# Patient Record
Sex: Female | Born: 1950
Health system: Southern US, Community
[De-identification: ages and names within clinical notes are randomized; demographics above are authoritative.]

## PROBLEM LIST (undated history)

## (undated) ENCOUNTER — Emergency Department (HOSPITAL_BASED_OUTPATIENT_CLINIC_OR_DEPARTMENT_OTHER): Payer: Federal, State, Local not specified - PPO

## (undated) DIAGNOSIS — E041 Nontoxic single thyroid nodule: Secondary | ICD-10-CM

## (undated) DIAGNOSIS — R0602 Shortness of breath: Secondary | ICD-10-CM

## (undated) DIAGNOSIS — H269 Unspecified cataract: Secondary | ICD-10-CM

## (undated) DIAGNOSIS — F419 Anxiety disorder, unspecified: Secondary | ICD-10-CM

## (undated) DIAGNOSIS — I10 Essential (primary) hypertension: Secondary | ICD-10-CM

## (undated) DIAGNOSIS — G4730 Sleep apnea, unspecified: Secondary | ICD-10-CM

## (undated) DIAGNOSIS — R5382 Chronic fatigue, unspecified: Secondary | ICD-10-CM

## (undated) DIAGNOSIS — I341 Nonrheumatic mitral (valve) prolapse: Secondary | ICD-10-CM

## (undated) DIAGNOSIS — B029 Zoster without complications: Secondary | ICD-10-CM

## (undated) DIAGNOSIS — D689 Coagulation defect, unspecified: Secondary | ICD-10-CM

## (undated) DIAGNOSIS — R600 Localized edema: Secondary | ICD-10-CM

## (undated) DIAGNOSIS — Z8719 Personal history of other diseases of the digestive system: Secondary | ICD-10-CM

## (undated) DIAGNOSIS — M797 Fibromyalgia: Secondary | ICD-10-CM

## (undated) DIAGNOSIS — I2699 Other pulmonary embolism without acute cor pulmonale: Secondary | ICD-10-CM

## (undated) DIAGNOSIS — T7840XA Allergy, unspecified, initial encounter: Secondary | ICD-10-CM

## (undated) DIAGNOSIS — D649 Anemia, unspecified: Secondary | ICD-10-CM

## (undated) DIAGNOSIS — R7303 Prediabetes: Secondary | ICD-10-CM

## (undated) DIAGNOSIS — K648 Other hemorrhoids: Secondary | ICD-10-CM

## (undated) DIAGNOSIS — K635 Polyp of colon: Secondary | ICD-10-CM

## (undated) DIAGNOSIS — G8929 Other chronic pain: Secondary | ICD-10-CM

## (undated) DIAGNOSIS — M1990 Unspecified osteoarthritis, unspecified site: Secondary | ICD-10-CM

## (undated) DIAGNOSIS — K222 Esophageal obstruction: Secondary | ICD-10-CM

## (undated) DIAGNOSIS — K5790 Diverticulosis of intestine, part unspecified, without perforation or abscess without bleeding: Secondary | ICD-10-CM

## (undated) DIAGNOSIS — R011 Cardiac murmur, unspecified: Secondary | ICD-10-CM

## (undated) DIAGNOSIS — H40009 Preglaucoma, unspecified, unspecified eye: Secondary | ICD-10-CM

## (undated) DIAGNOSIS — G4733 Obstructive sleep apnea (adult) (pediatric): Secondary | ICD-10-CM

## (undated) DIAGNOSIS — K219 Gastro-esophageal reflux disease without esophagitis: Secondary | ICD-10-CM

## (undated) DIAGNOSIS — E119 Type 2 diabetes mellitus without complications: Secondary | ICD-10-CM

## (undated) DIAGNOSIS — E739 Lactose intolerance, unspecified: Secondary | ICD-10-CM

## (undated) DIAGNOSIS — R1314 Dysphagia, pharyngoesophageal phase: Secondary | ICD-10-CM

## (undated) DIAGNOSIS — K589 Irritable bowel syndrome without diarrhea: Secondary | ICD-10-CM

## (undated) DIAGNOSIS — R079 Chest pain, unspecified: Secondary | ICD-10-CM

## (undated) HISTORY — DX: Coagulation defect, unspecified: D68.9

## (undated) HISTORY — PX: BREAST REDUCTION SURGERY: SHX8

## (undated) HISTORY — DX: Lactose intolerance, unspecified: E73.9

## (undated) HISTORY — DX: Sleep apnea, unspecified: G47.30

## (undated) HISTORY — DX: Nonrheumatic mitral (valve) prolapse: I34.1

## (undated) HISTORY — DX: Chest pain, unspecified: R07.9

## (undated) HISTORY — DX: Type 2 diabetes mellitus without complications: E11.9

## (undated) HISTORY — DX: Unspecified cataract: H26.9

## (undated) HISTORY — DX: Other hemorrhoids: K64.8

## (undated) HISTORY — DX: Prediabetes: R73.03

## (undated) HISTORY — DX: Allergy, unspecified, initial encounter: T78.40XA

## (undated) HISTORY — PX: COLONOSCOPY: SHX174

## (undated) HISTORY — DX: Anxiety disorder, unspecified: F41.9

## (undated) HISTORY — PX: APPENDECTOMY: SHX54

## (undated) HISTORY — DX: Preglaucoma, unspecified, unspecified eye: H40.009

## (undated) HISTORY — PX: ABDOMINAL HYSTERECTOMY: SHX81

## (undated) HISTORY — PX: UPPER GASTROINTESTINAL ENDOSCOPY: SHX188

## (undated) HISTORY — PX: CHOLECYSTECTOMY: SHX55

## (undated) HISTORY — DX: Esophageal obstruction: K22.2

## (undated) HISTORY — DX: Other pulmonary embolism without acute cor pulmonale: I26.99

## (undated) HISTORY — DX: Chronic fatigue, unspecified: R53.82

## (undated) HISTORY — PX: NISSEN FUNDOPLICATION: SHX2091

## (undated) HISTORY — PX: HEMORRHOID BANDING: SHX5850

## (undated) HISTORY — PX: HERNIA REPAIR: SHX51

## (undated) HISTORY — DX: Diverticulosis of intestine, part unspecified, without perforation or abscess without bleeding: K57.90

## (undated) HISTORY — DX: Polyp of colon: K63.5

## (undated) HISTORY — PX: JOINT REPLACEMENT: SHX530

## (undated) HISTORY — DX: Zoster without complications: B02.9

## (undated) HISTORY — DX: Gastro-esophageal reflux disease without esophagitis: K21.9

## (undated) HISTORY — PX: POLYPECTOMY: SHX149

## (undated) HISTORY — DX: Other chronic pain: G89.29

## (undated) HISTORY — DX: Dysphagia, pharyngoesophageal phase: R13.14

## (undated) HISTORY — DX: Localized edema: R60.0

## (undated) HISTORY — DX: Obstructive sleep apnea (adult) (pediatric): G47.33

## (undated) SURGERY — ARTHROSCOPY, KNEE
Anesthesia: General | Laterality: Left

---

## 1974-10-23 ENCOUNTER — Encounter: Payer: Self-pay | Admitting: Internal Medicine

## 1974-11-05 ENCOUNTER — Encounter: Payer: Self-pay | Admitting: Internal Medicine

## 1993-04-24 HISTORY — PX: BRAIN SURGERY: SHX531

## 1997-09-09 ENCOUNTER — Ambulatory Visit (HOSPITAL_COMMUNITY): Admission: RE | Admit: 1997-09-09 | Discharge: 1997-09-09 | Payer: Self-pay | Admitting: *Deleted

## 1997-11-02 ENCOUNTER — Ambulatory Visit (HOSPITAL_COMMUNITY): Admission: RE | Admit: 1997-11-02 | Discharge: 1997-11-02 | Payer: Self-pay | Admitting: Podiatry

## 1997-11-04 ENCOUNTER — Other Ambulatory Visit: Admission: RE | Admit: 1997-11-04 | Discharge: 1997-11-04 | Payer: Self-pay | Admitting: Podiatry

## 1998-02-17 ENCOUNTER — Ambulatory Visit (HOSPITAL_COMMUNITY): Admission: RE | Admit: 1998-02-17 | Discharge: 1998-02-17 | Payer: Self-pay | Admitting: Gastroenterology

## 1998-02-17 ENCOUNTER — Encounter: Payer: Self-pay | Admitting: Gastroenterology

## 1998-04-15 ENCOUNTER — Emergency Department (HOSPITAL_COMMUNITY): Admission: EM | Admit: 1998-04-15 | Discharge: 1998-04-15 | Payer: Self-pay | Admitting: Emergency Medicine

## 1998-05-22 ENCOUNTER — Emergency Department (HOSPITAL_COMMUNITY): Admission: EM | Admit: 1998-05-22 | Discharge: 1998-05-22 | Payer: Self-pay | Admitting: Emergency Medicine

## 1998-05-22 ENCOUNTER — Encounter: Payer: Self-pay | Admitting: Emergency Medicine

## 1998-08-02 ENCOUNTER — Ambulatory Visit (HOSPITAL_COMMUNITY): Admission: RE | Admit: 1998-08-02 | Discharge: 1998-08-02 | Payer: Self-pay | Admitting: Gastroenterology

## 1998-08-19 ENCOUNTER — Ambulatory Visit (HOSPITAL_COMMUNITY): Admission: RE | Admit: 1998-08-19 | Discharge: 1998-08-19 | Payer: Self-pay | Admitting: Gastroenterology

## 1998-09-16 ENCOUNTER — Encounter: Payer: Self-pay | Admitting: Cardiology

## 1998-09-16 ENCOUNTER — Inpatient Hospital Stay (HOSPITAL_COMMUNITY): Admission: EM | Admit: 1998-09-16 | Discharge: 1998-09-19 | Payer: Self-pay | Admitting: General Surgery

## 1998-11-29 ENCOUNTER — Emergency Department (HOSPITAL_COMMUNITY): Admission: EM | Admit: 1998-11-29 | Discharge: 1998-11-30 | Payer: Self-pay

## 1999-03-21 ENCOUNTER — Ambulatory Visit (HOSPITAL_COMMUNITY): Admission: RE | Admit: 1999-03-21 | Discharge: 1999-03-21 | Payer: Self-pay | Admitting: General Surgery

## 1999-03-21 ENCOUNTER — Encounter: Payer: Self-pay | Admitting: General Surgery

## 1999-05-23 ENCOUNTER — Ambulatory Visit (HOSPITAL_COMMUNITY): Admission: RE | Admit: 1999-05-23 | Discharge: 1999-05-23 | Payer: Self-pay | Admitting: Gastroenterology

## 1999-05-23 ENCOUNTER — Encounter: Payer: Self-pay | Admitting: Gastroenterology

## 1999-07-02 ENCOUNTER — Emergency Department (HOSPITAL_COMMUNITY): Admission: EM | Admit: 1999-07-02 | Discharge: 1999-07-02 | Payer: Self-pay

## 1999-07-17 ENCOUNTER — Ambulatory Visit (HOSPITAL_COMMUNITY): Admission: RE | Admit: 1999-07-17 | Discharge: 1999-07-17 | Payer: Self-pay | Admitting: *Deleted

## 1999-07-17 ENCOUNTER — Encounter: Payer: Self-pay | Admitting: *Deleted

## 1999-07-25 ENCOUNTER — Inpatient Hospital Stay (HOSPITAL_COMMUNITY): Admission: RE | Admit: 1999-07-25 | Discharge: 1999-08-03 | Payer: Self-pay | Admitting: General Surgery

## 1999-07-30 ENCOUNTER — Encounter: Payer: Self-pay | Admitting: Surgery

## 2000-01-20 ENCOUNTER — Other Ambulatory Visit: Admission: RE | Admit: 2000-01-20 | Discharge: 2000-01-20 | Payer: Self-pay | Admitting: Internal Medicine

## 2000-07-23 ENCOUNTER — Emergency Department (HOSPITAL_COMMUNITY): Admission: EM | Admit: 2000-07-23 | Discharge: 2000-07-24 | Payer: Self-pay | Admitting: Emergency Medicine

## 2000-07-24 ENCOUNTER — Encounter: Payer: Self-pay | Admitting: Emergency Medicine

## 2000-07-26 ENCOUNTER — Encounter: Admission: RE | Admit: 2000-07-26 | Discharge: 2000-07-26 | Payer: Self-pay | Admitting: Internal Medicine

## 2000-07-26 ENCOUNTER — Encounter: Payer: Self-pay | Admitting: Internal Medicine

## 2000-08-07 ENCOUNTER — Encounter: Payer: Self-pay | Admitting: Gastroenterology

## 2000-08-07 ENCOUNTER — Ambulatory Visit (HOSPITAL_COMMUNITY): Admission: RE | Admit: 2000-08-07 | Discharge: 2000-08-07 | Payer: Self-pay | Admitting: Gastroenterology

## 2001-03-29 ENCOUNTER — Emergency Department (HOSPITAL_COMMUNITY): Admission: EM | Admit: 2001-03-29 | Discharge: 2001-03-30 | Payer: Self-pay | Admitting: *Deleted

## 2001-06-04 ENCOUNTER — Encounter: Payer: Self-pay | Admitting: Gastroenterology

## 2001-06-04 ENCOUNTER — Encounter: Admission: RE | Admit: 2001-06-04 | Discharge: 2001-06-04 | Payer: Self-pay | Admitting: Gastroenterology

## 2001-06-10 ENCOUNTER — Encounter: Payer: Self-pay | Admitting: Gastroenterology

## 2001-06-10 ENCOUNTER — Encounter: Admission: RE | Admit: 2001-06-10 | Discharge: 2001-06-10 | Payer: Self-pay | Admitting: Gastroenterology

## 2002-02-07 ENCOUNTER — Encounter: Payer: Self-pay | Admitting: Internal Medicine

## 2002-02-07 ENCOUNTER — Encounter: Admission: RE | Admit: 2002-02-07 | Discharge: 2002-02-07 | Payer: Self-pay | Admitting: Internal Medicine

## 2002-06-21 ENCOUNTER — Emergency Department (HOSPITAL_COMMUNITY): Admission: EM | Admit: 2002-06-21 | Discharge: 2002-06-22 | Payer: Self-pay | Admitting: Emergency Medicine

## 2002-06-22 ENCOUNTER — Encounter: Payer: Self-pay | Admitting: Emergency Medicine

## 2002-09-24 ENCOUNTER — Encounter (INDEPENDENT_AMBULATORY_CARE_PROVIDER_SITE_OTHER): Payer: Self-pay | Admitting: Gastroenterology

## 2003-02-20 ENCOUNTER — Ambulatory Visit (HOSPITAL_BASED_OUTPATIENT_CLINIC_OR_DEPARTMENT_OTHER): Admission: RE | Admit: 2003-02-20 | Discharge: 2003-02-20 | Payer: Self-pay | Admitting: Internal Medicine

## 2003-02-20 ENCOUNTER — Encounter: Payer: Self-pay | Admitting: Pulmonary Disease

## 2003-03-27 ENCOUNTER — Encounter: Admission: RE | Admit: 2003-03-27 | Discharge: 2003-03-27 | Payer: Self-pay | Admitting: Internal Medicine

## 2003-04-20 ENCOUNTER — Emergency Department (HOSPITAL_COMMUNITY): Admission: EM | Admit: 2003-04-20 | Discharge: 2003-04-21 | Payer: Self-pay | Admitting: Emergency Medicine

## 2003-06-19 ENCOUNTER — Ambulatory Visit (HOSPITAL_COMMUNITY): Admission: RE | Admit: 2003-06-19 | Discharge: 2003-06-19 | Payer: Self-pay | Admitting: Gastroenterology

## 2003-07-08 ENCOUNTER — Emergency Department (HOSPITAL_COMMUNITY): Admission: EM | Admit: 2003-07-08 | Discharge: 2003-07-08 | Payer: Self-pay | Admitting: Emergency Medicine

## 2003-08-06 ENCOUNTER — Ambulatory Visit (HOSPITAL_COMMUNITY): Admission: RE | Admit: 2003-08-06 | Discharge: 2003-08-06 | Payer: Self-pay | Admitting: Internal Medicine

## 2003-08-07 ENCOUNTER — Ambulatory Visit (HOSPITAL_COMMUNITY): Admission: RE | Admit: 2003-08-07 | Discharge: 2003-08-07 | Payer: Self-pay | Admitting: Gastroenterology

## 2003-09-03 ENCOUNTER — Ambulatory Visit (HOSPITAL_COMMUNITY): Admission: RE | Admit: 2003-09-03 | Discharge: 2003-09-03 | Payer: Self-pay | Admitting: Gastroenterology

## 2004-02-01 ENCOUNTER — Ambulatory Visit (HOSPITAL_COMMUNITY): Admission: RE | Admit: 2004-02-01 | Discharge: 2004-02-01 | Payer: Self-pay | Admitting: Internal Medicine

## 2004-04-06 ENCOUNTER — Ambulatory Visit: Payer: Self-pay | Admitting: Internal Medicine

## 2004-04-07 ENCOUNTER — Ambulatory Visit: Payer: Self-pay | Admitting: Internal Medicine

## 2004-04-13 ENCOUNTER — Encounter: Admission: RE | Admit: 2004-04-13 | Discharge: 2004-04-13 | Payer: Self-pay | Admitting: Internal Medicine

## 2004-04-27 ENCOUNTER — Encounter: Admission: RE | Admit: 2004-04-27 | Discharge: 2004-04-27 | Payer: Self-pay | Admitting: Internal Medicine

## 2004-04-28 ENCOUNTER — Ambulatory Visit: Payer: Self-pay | Admitting: Internal Medicine

## 2004-04-28 ENCOUNTER — Ambulatory Visit (HOSPITAL_COMMUNITY): Admission: RE | Admit: 2004-04-28 | Discharge: 2004-04-28 | Payer: Self-pay | Admitting: Gastroenterology

## 2004-05-02 ENCOUNTER — Encounter (INDEPENDENT_AMBULATORY_CARE_PROVIDER_SITE_OTHER): Payer: Self-pay | Admitting: *Deleted

## 2004-05-02 ENCOUNTER — Ambulatory Visit (HOSPITAL_COMMUNITY): Admission: RE | Admit: 2004-05-02 | Discharge: 2004-05-02 | Payer: Self-pay | Admitting: Specialist

## 2004-05-02 ENCOUNTER — Ambulatory Visit (HOSPITAL_BASED_OUTPATIENT_CLINIC_OR_DEPARTMENT_OTHER): Admission: RE | Admit: 2004-05-02 | Discharge: 2004-05-02 | Payer: Self-pay | Admitting: Specialist

## 2004-07-26 ENCOUNTER — Ambulatory Visit: Payer: Self-pay | Admitting: Internal Medicine

## 2004-08-01 ENCOUNTER — Ambulatory Visit (HOSPITAL_COMMUNITY): Admission: RE | Admit: 2004-08-01 | Discharge: 2004-08-01 | Payer: Self-pay | Admitting: Internal Medicine

## 2004-08-15 ENCOUNTER — Ambulatory Visit: Payer: Self-pay | Admitting: Internal Medicine

## 2004-08-29 ENCOUNTER — Ambulatory Visit: Payer: Self-pay

## 2004-09-09 ENCOUNTER — Ambulatory Visit: Payer: Self-pay | Admitting: Pulmonary Disease

## 2004-10-17 ENCOUNTER — Ambulatory Visit: Payer: Self-pay | Admitting: Internal Medicine

## 2004-10-31 ENCOUNTER — Ambulatory Visit: Payer: Self-pay

## 2004-11-01 ENCOUNTER — Ambulatory Visit: Payer: Self-pay | Admitting: Pulmonary Disease

## 2004-11-24 ENCOUNTER — Ambulatory Visit: Payer: Self-pay | Admitting: Internal Medicine

## 2005-02-01 ENCOUNTER — Ambulatory Visit: Payer: Self-pay | Admitting: Gastroenterology

## 2005-03-08 ENCOUNTER — Ambulatory Visit: Payer: Self-pay | Admitting: Internal Medicine

## 2005-03-14 ENCOUNTER — Encounter: Admission: RE | Admit: 2005-03-14 | Discharge: 2005-03-14 | Payer: Self-pay | Admitting: Gastroenterology

## 2005-04-24 HISTORY — PX: REDUCTION MAMMAPLASTY: SUR839

## 2005-05-16 ENCOUNTER — Encounter: Admission: RE | Admit: 2005-05-16 | Discharge: 2005-08-14 | Payer: Self-pay | Admitting: Gastroenterology

## 2005-05-17 ENCOUNTER — Ambulatory Visit: Payer: Self-pay | Admitting: Gastroenterology

## 2005-05-30 ENCOUNTER — Ambulatory Visit: Payer: Self-pay | Admitting: Gastroenterology

## 2005-07-24 ENCOUNTER — Ambulatory Visit: Payer: Self-pay | Admitting: Gastroenterology

## 2005-07-31 ENCOUNTER — Ambulatory Visit: Payer: Self-pay | Admitting: Cardiology

## 2005-11-24 ENCOUNTER — Ambulatory Visit: Payer: Self-pay | Admitting: Internal Medicine

## 2005-12-12 ENCOUNTER — Ambulatory Visit: Payer: Self-pay | Admitting: Internal Medicine

## 2006-01-31 ENCOUNTER — Ambulatory Visit: Payer: Self-pay | Admitting: Internal Medicine

## 2006-02-07 ENCOUNTER — Ambulatory Visit: Payer: Self-pay | Admitting: Gastroenterology

## 2006-02-07 ENCOUNTER — Ambulatory Visit: Payer: Self-pay | Admitting: Internal Medicine

## 2006-02-09 ENCOUNTER — Ambulatory Visit: Payer: Self-pay | Admitting: Gastroenterology

## 2006-02-13 ENCOUNTER — Ambulatory Visit (HOSPITAL_COMMUNITY): Admission: RE | Admit: 2006-02-13 | Discharge: 2006-02-13 | Payer: Self-pay | Admitting: Gastroenterology

## 2006-02-15 ENCOUNTER — Ambulatory Visit: Payer: Self-pay | Admitting: Gastroenterology

## 2006-03-14 ENCOUNTER — Ambulatory Visit: Payer: Self-pay | Admitting: Internal Medicine

## 2006-04-24 HISTORY — PX: CHOLECYSTECTOMY: SHX55

## 2006-05-30 ENCOUNTER — Ambulatory Visit: Payer: Self-pay | Admitting: Gastroenterology

## 2006-06-25 ENCOUNTER — Encounter: Admission: RE | Admit: 2006-06-25 | Discharge: 2006-06-25 | Payer: Self-pay | Admitting: General Surgery

## 2006-06-28 ENCOUNTER — Encounter: Admission: RE | Admit: 2006-06-28 | Discharge: 2006-06-28 | Payer: Self-pay | Admitting: General Surgery

## 2006-09-05 ENCOUNTER — Ambulatory Visit: Payer: Self-pay | Admitting: Gastroenterology

## 2006-09-25 ENCOUNTER — Ambulatory Visit: Payer: Self-pay | Admitting: Cardiology

## 2006-10-09 ENCOUNTER — Ambulatory Visit: Payer: Self-pay | Admitting: Internal Medicine

## 2006-10-17 ENCOUNTER — Ambulatory Visit: Payer: Self-pay | Admitting: Internal Medicine

## 2006-10-23 ENCOUNTER — Ambulatory Visit: Payer: Self-pay | Admitting: Internal Medicine

## 2006-10-24 ENCOUNTER — Ambulatory Visit (HOSPITAL_COMMUNITY): Admission: RE | Admit: 2006-10-24 | Discharge: 2006-10-24 | Payer: Self-pay | Admitting: Internal Medicine

## 2006-11-23 ENCOUNTER — Ambulatory Visit: Payer: Self-pay | Admitting: Internal Medicine

## 2006-12-12 ENCOUNTER — Encounter (INDEPENDENT_AMBULATORY_CARE_PROVIDER_SITE_OTHER): Payer: Self-pay | Admitting: General Surgery

## 2006-12-12 ENCOUNTER — Ambulatory Visit (HOSPITAL_COMMUNITY): Admission: RE | Admit: 2006-12-12 | Discharge: 2006-12-14 | Payer: Self-pay | Admitting: General Surgery

## 2007-01-04 ENCOUNTER — Ambulatory Visit: Payer: Self-pay | Admitting: Internal Medicine

## 2007-01-26 ENCOUNTER — Ambulatory Visit: Payer: Self-pay | Admitting: Internal Medicine

## 2007-01-31 ENCOUNTER — Encounter: Payer: Self-pay | Admitting: *Deleted

## 2007-01-31 DIAGNOSIS — Z9079 Acquired absence of other genital organ(s): Secondary | ICD-10-CM | POA: Insufficient documentation

## 2007-01-31 DIAGNOSIS — K589 Irritable bowel syndrome without diarrhea: Secondary | ICD-10-CM | POA: Insufficient documentation

## 2007-01-31 DIAGNOSIS — K219 Gastro-esophageal reflux disease without esophagitis: Secondary | ICD-10-CM | POA: Insufficient documentation

## 2007-01-31 DIAGNOSIS — Z8679 Personal history of other diseases of the circulatory system: Secondary | ICD-10-CM | POA: Insufficient documentation

## 2007-02-27 ENCOUNTER — Ambulatory Visit: Payer: Self-pay | Admitting: Internal Medicine

## 2007-03-14 ENCOUNTER — Telehealth (INDEPENDENT_AMBULATORY_CARE_PROVIDER_SITE_OTHER): Payer: Self-pay | Admitting: *Deleted

## 2007-03-15 ENCOUNTER — Ambulatory Visit: Payer: Self-pay | Admitting: Internal Medicine

## 2007-03-15 DIAGNOSIS — H9319 Tinnitus, unspecified ear: Secondary | ICD-10-CM | POA: Insufficient documentation

## 2007-03-18 ENCOUNTER — Encounter (INDEPENDENT_AMBULATORY_CARE_PROVIDER_SITE_OTHER): Payer: Self-pay | Admitting: *Deleted

## 2007-03-29 ENCOUNTER — Encounter: Payer: Self-pay | Admitting: Internal Medicine

## 2007-04-09 ENCOUNTER — Encounter: Payer: Self-pay | Admitting: Internal Medicine

## 2007-04-15 ENCOUNTER — Encounter: Payer: Self-pay | Admitting: Internal Medicine

## 2007-05-22 DIAGNOSIS — K222 Esophageal obstruction: Secondary | ICD-10-CM | POA: Insufficient documentation

## 2007-06-19 ENCOUNTER — Ambulatory Visit: Payer: Self-pay | Admitting: Internal Medicine

## 2007-07-17 ENCOUNTER — Telehealth: Payer: Self-pay | Admitting: Internal Medicine

## 2007-07-18 ENCOUNTER — Ambulatory Visit: Payer: Self-pay | Admitting: Internal Medicine

## 2007-08-08 ENCOUNTER — Encounter
Admission: RE | Admit: 2007-08-08 | Discharge: 2007-08-08 | Payer: Self-pay | Admitting: Physical Medicine & Rehabilitation

## 2007-08-26 ENCOUNTER — Telehealth: Payer: Self-pay | Admitting: Internal Medicine

## 2007-08-26 DIAGNOSIS — R911 Solitary pulmonary nodule: Secondary | ICD-10-CM | POA: Insufficient documentation

## 2007-08-28 ENCOUNTER — Encounter: Payer: Self-pay | Admitting: Internal Medicine

## 2007-08-28 ENCOUNTER — Ambulatory Visit: Payer: Self-pay | Admitting: Internal Medicine

## 2007-08-29 ENCOUNTER — Ambulatory Visit: Payer: Self-pay | Admitting: Internal Medicine

## 2007-09-01 ENCOUNTER — Encounter: Payer: Self-pay | Admitting: Internal Medicine

## 2007-09-03 ENCOUNTER — Ambulatory Visit: Payer: Self-pay | Admitting: Internal Medicine

## 2007-09-03 ENCOUNTER — Encounter: Payer: Self-pay | Admitting: Internal Medicine

## 2007-09-03 LAB — HM COLONOSCOPY

## 2007-09-06 ENCOUNTER — Encounter: Payer: Self-pay | Admitting: Internal Medicine

## 2007-09-09 ENCOUNTER — Encounter: Payer: Self-pay | Admitting: Internal Medicine

## 2007-09-10 ENCOUNTER — Encounter: Payer: Self-pay | Admitting: Internal Medicine

## 2007-10-07 ENCOUNTER — Telehealth (INDEPENDENT_AMBULATORY_CARE_PROVIDER_SITE_OTHER): Payer: Self-pay | Admitting: *Deleted

## 2007-10-09 ENCOUNTER — Ambulatory Visit: Payer: Self-pay | Admitting: Internal Medicine

## 2007-10-14 ENCOUNTER — Ambulatory Visit: Payer: Self-pay | Admitting: Internal Medicine

## 2007-10-14 LAB — CONVERTED CEMR LAB
ALT: 16 units/L (ref 0–35)
AST: 22 units/L (ref 0–37)
Alkaline Phosphatase: 65 units/L (ref 39–117)
Anti Nuclear Antibody(ANA): NEGATIVE
Bilirubin, Direct: 0.1 mg/dL (ref 0.0–0.3)
CO2: 32 meq/L (ref 19–32)
Chloride: 101 meq/L (ref 96–112)
Glucose, Bld: 122 mg/dL — ABNORMAL HIGH (ref 70–99)
Hemoglobin: 12.5 g/dL (ref 12.0–15.0)
Lymphocytes Relative: 50.5 % — ABNORMAL HIGH (ref 12.0–46.0)
Monocytes Relative: 8.2 % (ref 3.0–12.0)
Neutro Abs: 1.7 10*3/uL (ref 1.4–7.7)
Neutrophils Relative %: 39.4 % — ABNORMAL LOW (ref 43.0–77.0)
Platelets: 263 10*3/uL (ref 150–400)
Potassium: 3.5 meq/L (ref 3.5–5.1)
RDW: 14.7 % — ABNORMAL HIGH (ref 11.5–14.6)
Rhuematoid fact SerPl-aCnc: <20 intl units/mL — ABNORMAL LOW (ref 0.0–20.0)
Sodium: 141 meq/L (ref 135–145)
Total Protein: 7.6 g/dL (ref 6.0–8.3)

## 2007-10-18 ENCOUNTER — Ambulatory Visit: Payer: Self-pay | Admitting: Internal Medicine

## 2007-10-24 ENCOUNTER — Encounter: Payer: Self-pay | Admitting: Internal Medicine

## 2007-10-24 LAB — CONVERTED CEMR LAB: 5-HIAA, 24 Hr Urine: 8 mg/(24.h) — ABNORMAL HIGH (ref <=6.0)

## 2007-11-01 ENCOUNTER — Encounter: Payer: Self-pay | Admitting: Internal Medicine

## 2007-11-06 ENCOUNTER — Telehealth: Payer: Self-pay | Admitting: Internal Medicine

## 2007-11-06 ENCOUNTER — Ambulatory Visit: Payer: Self-pay | Admitting: Internal Medicine

## 2007-11-13 ENCOUNTER — Telehealth: Payer: Self-pay | Admitting: Internal Medicine

## 2007-12-27 ENCOUNTER — Telehealth: Payer: Self-pay | Admitting: Internal Medicine

## 2008-01-15 ENCOUNTER — Ambulatory Visit: Payer: Self-pay | Admitting: Internal Medicine

## 2008-01-15 DIAGNOSIS — R55 Syncope and collapse: Secondary | ICD-10-CM | POA: Insufficient documentation

## 2008-01-15 DIAGNOSIS — R252 Cramp and spasm: Secondary | ICD-10-CM | POA: Insufficient documentation

## 2008-01-20 ENCOUNTER — Ambulatory Visit: Payer: Self-pay

## 2008-01-27 ENCOUNTER — Encounter: Admission: RE | Admit: 2008-01-27 | Discharge: 2008-01-27 | Payer: Self-pay | Admitting: Internal Medicine

## 2008-02-03 ENCOUNTER — Telehealth: Payer: Self-pay | Admitting: Internal Medicine

## 2008-02-05 ENCOUNTER — Encounter: Admission: RE | Admit: 2008-02-05 | Discharge: 2008-02-05 | Payer: Self-pay | Admitting: Internal Medicine

## 2008-02-10 ENCOUNTER — Telehealth: Payer: Self-pay | Admitting: Internal Medicine

## 2008-02-11 ENCOUNTER — Telehealth: Payer: Self-pay | Admitting: Internal Medicine

## 2008-02-14 ENCOUNTER — Encounter: Payer: Self-pay | Admitting: Internal Medicine

## 2008-02-19 ENCOUNTER — Encounter: Payer: Self-pay | Admitting: Internal Medicine

## 2008-02-24 ENCOUNTER — Telehealth: Payer: Self-pay | Admitting: Internal Medicine

## 2008-02-26 ENCOUNTER — Telehealth: Payer: Self-pay | Admitting: Internal Medicine

## 2008-02-27 ENCOUNTER — Ambulatory Visit: Payer: Self-pay | Admitting: Internal Medicine

## 2008-02-28 ENCOUNTER — Ambulatory Visit: Payer: Self-pay | Admitting: Internal Medicine

## 2008-04-06 ENCOUNTER — Ambulatory Visit: Payer: Self-pay | Admitting: Internal Medicine

## 2008-04-06 ENCOUNTER — Telehealth: Payer: Self-pay | Admitting: Internal Medicine

## 2008-04-15 ENCOUNTER — Encounter: Admission: RE | Admit: 2008-04-15 | Discharge: 2008-04-15 | Payer: Self-pay | Admitting: Family Medicine

## 2008-04-29 ENCOUNTER — Ambulatory Visit: Payer: Self-pay | Admitting: Internal Medicine

## 2008-04-29 ENCOUNTER — Ambulatory Visit: Payer: Self-pay | Admitting: Cardiology

## 2008-04-29 ENCOUNTER — Inpatient Hospital Stay (HOSPITAL_COMMUNITY): Admission: EM | Admit: 2008-04-29 | Discharge: 2008-05-05 | Payer: Self-pay | Admitting: Emergency Medicine

## 2008-04-30 ENCOUNTER — Encounter: Payer: Self-pay | Admitting: Internal Medicine

## 2008-04-30 ENCOUNTER — Ambulatory Visit: Payer: Self-pay | Admitting: Vascular Surgery

## 2008-05-06 ENCOUNTER — Telehealth: Payer: Self-pay | Admitting: Internal Medicine

## 2008-05-07 ENCOUNTER — Emergency Department (HOSPITAL_COMMUNITY): Admission: EM | Admit: 2008-05-07 | Discharge: 2008-05-07 | Payer: Self-pay | Admitting: Emergency Medicine

## 2008-05-07 ENCOUNTER — Ambulatory Visit: Payer: Self-pay | Admitting: Internal Medicine

## 2008-05-08 ENCOUNTER — Telehealth: Payer: Self-pay | Admitting: Internal Medicine

## 2008-05-08 ENCOUNTER — Encounter: Payer: Self-pay | Admitting: Internal Medicine

## 2008-05-08 ENCOUNTER — Ambulatory Visit: Payer: Self-pay | Admitting: Cardiology

## 2008-05-11 ENCOUNTER — Ambulatory Visit: Payer: Self-pay | Admitting: Cardiology

## 2008-05-14 ENCOUNTER — Ambulatory Visit: Payer: Self-pay | Admitting: Cardiology

## 2008-05-18 ENCOUNTER — Ambulatory Visit: Payer: Self-pay | Admitting: Internal Medicine

## 2008-05-19 DIAGNOSIS — I2699 Other pulmonary embolism without acute cor pulmonale: Secondary | ICD-10-CM | POA: Insufficient documentation

## 2008-05-26 ENCOUNTER — Telehealth (INDEPENDENT_AMBULATORY_CARE_PROVIDER_SITE_OTHER): Payer: Self-pay | Admitting: *Deleted

## 2008-05-28 ENCOUNTER — Telehealth: Payer: Self-pay | Admitting: Internal Medicine

## 2008-06-01 DIAGNOSIS — M25569 Pain in unspecified knee: Secondary | ICD-10-CM | POA: Insufficient documentation

## 2008-06-02 ENCOUNTER — Ambulatory Visit: Payer: Self-pay | Admitting: Internal Medicine

## 2008-06-02 LAB — CONVERTED CEMR LAB
Ketones, ur: NEGATIVE mg/dL
Leukocytes, UA: NEGATIVE
Specific Gravity, Urine: 1.025 (ref 1.000–1.03)
Urobilinogen, UA: 0.2 (ref 0.0–1.0)

## 2008-06-08 ENCOUNTER — Ambulatory Visit: Payer: Self-pay | Admitting: Internal Medicine

## 2008-06-29 ENCOUNTER — Ambulatory Visit: Payer: Self-pay | Admitting: Cardiology

## 2008-07-09 ENCOUNTER — Ambulatory Visit: Payer: Self-pay | Admitting: Internal Medicine

## 2008-07-20 ENCOUNTER — Ambulatory Visit: Payer: Self-pay | Admitting: Cardiology

## 2008-07-23 ENCOUNTER — Encounter: Admission: RE | Admit: 2008-07-23 | Discharge: 2008-07-23 | Payer: Self-pay | Admitting: Internal Medicine

## 2008-07-24 ENCOUNTER — Ambulatory Visit: Payer: Self-pay | Admitting: Diagnostic Radiology

## 2008-07-24 ENCOUNTER — Encounter: Payer: Self-pay | Admitting: Emergency Medicine

## 2008-07-24 ENCOUNTER — Observation Stay (HOSPITAL_COMMUNITY): Admission: EM | Admit: 2008-07-24 | Discharge: 2008-07-26 | Payer: Self-pay | Admitting: Internal Medicine

## 2008-07-24 ENCOUNTER — Telehealth: Payer: Self-pay | Admitting: Family Medicine

## 2008-07-24 ENCOUNTER — Ambulatory Visit: Payer: Self-pay | Admitting: Internal Medicine

## 2008-07-27 ENCOUNTER — Telehealth: Payer: Self-pay | Admitting: Internal Medicine

## 2008-08-04 ENCOUNTER — Ambulatory Visit: Payer: Self-pay | Admitting: Internal Medicine

## 2008-08-13 ENCOUNTER — Ambulatory Visit: Payer: Self-pay | Admitting: Internal Medicine

## 2008-09-11 ENCOUNTER — Ambulatory Visit: Payer: Self-pay | Admitting: Internal Medicine

## 2008-09-22 ENCOUNTER — Encounter: Payer: Self-pay | Admitting: *Deleted

## 2008-09-24 ENCOUNTER — Ambulatory Visit: Payer: Self-pay | Admitting: Internal Medicine

## 2008-09-24 LAB — CONVERTED CEMR LAB
POC INR: 1.2
Protime: 13.7

## 2008-10-01 ENCOUNTER — Ambulatory Visit: Payer: Self-pay | Admitting: Cardiology

## 2008-10-08 ENCOUNTER — Telehealth: Payer: Self-pay | Admitting: Internal Medicine

## 2008-10-16 ENCOUNTER — Telehealth (INDEPENDENT_AMBULATORY_CARE_PROVIDER_SITE_OTHER): Payer: Self-pay | Admitting: Cardiology

## 2008-10-16 ENCOUNTER — Encounter (INDEPENDENT_AMBULATORY_CARE_PROVIDER_SITE_OTHER): Payer: Self-pay | Admitting: Pharmacist

## 2008-10-16 ENCOUNTER — Ambulatory Visit: Payer: Self-pay | Admitting: Internal Medicine

## 2008-10-16 ENCOUNTER — Telehealth: Payer: Self-pay | Admitting: Internal Medicine

## 2008-10-16 ENCOUNTER — Encounter: Payer: Self-pay | Admitting: Cardiology

## 2008-10-16 LAB — CONVERTED CEMR LAB: Prothrombin Time: 17.5 s

## 2008-10-23 ENCOUNTER — Ambulatory Visit: Payer: Self-pay | Admitting: Internal Medicine

## 2008-10-28 ENCOUNTER — Encounter: Payer: Self-pay | Admitting: Cardiology

## 2008-10-28 ENCOUNTER — Encounter: Payer: Self-pay | Admitting: *Deleted

## 2008-10-28 ENCOUNTER — Ambulatory Visit: Payer: Self-pay | Admitting: Internal Medicine

## 2008-10-28 LAB — CONVERTED CEMR LAB: POC INR: 2.2

## 2008-11-02 ENCOUNTER — Ambulatory Visit: Payer: Self-pay | Admitting: Vascular Surgery

## 2008-11-19 ENCOUNTER — Ambulatory Visit: Payer: Self-pay | Admitting: Internal Medicine

## 2008-11-19 ENCOUNTER — Encounter: Admission: RE | Admit: 2008-11-19 | Discharge: 2008-11-19 | Payer: Self-pay | Admitting: Orthopedic Surgery

## 2008-11-19 DIAGNOSIS — I872 Venous insufficiency (chronic) (peripheral): Secondary | ICD-10-CM | POA: Insufficient documentation

## 2008-11-25 ENCOUNTER — Encounter: Payer: Self-pay | Admitting: Internal Medicine

## 2008-12-21 ENCOUNTER — Encounter: Payer: Self-pay | Admitting: Internal Medicine

## 2009-01-15 ENCOUNTER — Encounter: Payer: Self-pay | Admitting: Internal Medicine

## 2009-01-20 ENCOUNTER — Encounter: Payer: Self-pay | Admitting: Internal Medicine

## 2009-01-24 ENCOUNTER — Telehealth: Payer: Self-pay | Admitting: Internal Medicine

## 2009-01-24 ENCOUNTER — Emergency Department (HOSPITAL_COMMUNITY): Admission: EM | Admit: 2009-01-24 | Discharge: 2009-01-24 | Payer: Self-pay | Admitting: Emergency Medicine

## 2009-01-26 ENCOUNTER — Ambulatory Visit: Payer: Self-pay | Admitting: Internal Medicine

## 2009-02-01 ENCOUNTER — Encounter: Payer: Self-pay | Admitting: Internal Medicine

## 2009-02-08 ENCOUNTER — Encounter: Payer: Self-pay | Admitting: Internal Medicine

## 2009-02-08 ENCOUNTER — Ambulatory Visit: Payer: Self-pay | Admitting: Vascular Surgery

## 2009-02-11 ENCOUNTER — Telehealth: Payer: Self-pay | Admitting: Internal Medicine

## 2009-03-15 ENCOUNTER — Ambulatory Visit: Payer: Self-pay | Admitting: Vascular Surgery

## 2009-03-22 ENCOUNTER — Ambulatory Visit: Payer: Self-pay | Admitting: Vascular Surgery

## 2009-05-06 ENCOUNTER — Ambulatory Visit: Payer: Self-pay | Admitting: Vascular Surgery

## 2009-06-21 ENCOUNTER — Telehealth: Payer: Self-pay | Admitting: Internal Medicine

## 2009-06-24 ENCOUNTER — Telehealth: Payer: Self-pay | Admitting: Internal Medicine

## 2009-06-24 ENCOUNTER — Ambulatory Visit: Payer: Self-pay | Admitting: Endocrinology

## 2009-06-24 DIAGNOSIS — R42 Dizziness and giddiness: Secondary | ICD-10-CM | POA: Insufficient documentation

## 2009-06-24 DIAGNOSIS — I10 Essential (primary) hypertension: Secondary | ICD-10-CM | POA: Insufficient documentation

## 2009-06-24 DIAGNOSIS — R51 Headache: Secondary | ICD-10-CM | POA: Insufficient documentation

## 2009-06-24 DIAGNOSIS — R519 Headache, unspecified: Secondary | ICD-10-CM | POA: Insufficient documentation

## 2009-06-24 LAB — CONVERTED CEMR LAB: Blood Glucose, Fingerstick: 101

## 2009-06-25 LAB — CONVERTED CEMR LAB
Basophils Relative: 0.8 % (ref 0.0–3.0)
CO2: 33 meq/L — ABNORMAL HIGH (ref 19–32)
Calcium: 8.6 mg/dL (ref 8.4–10.5)
Creatinine, Ser: 0.8 mg/dL (ref 0.4–1.2)
Eosinophils Absolute: 0.2 10*3/uL (ref 0.0–0.7)
Eosinophils Relative: 3.2 % (ref 0.0–5.0)
Hemoglobin: 12.1 g/dL (ref 12.0–15.0)
Lymphocytes Relative: 46.8 % — ABNORMAL HIGH (ref 12.0–46.0)
MCHC: 32.2 g/dL (ref 30.0–36.0)
Monocytes Relative: 11.1 % (ref 3.0–12.0)
Neutro Abs: 1.9 10*3/uL (ref 1.4–7.7)
Neutrophils Relative %: 38.1 % — ABNORMAL LOW (ref 43.0–77.0)
RBC: 4.29 M/uL (ref 3.87–5.11)
Sodium: 142 meq/L (ref 135–145)
WBC: 4.9 10*3/uL (ref 4.5–10.5)

## 2009-07-02 ENCOUNTER — Ambulatory Visit: Payer: Self-pay | Admitting: Pulmonary Disease

## 2009-07-06 ENCOUNTER — Encounter: Payer: Self-pay | Admitting: Endocrinology

## 2009-07-12 LAB — CONVERTED CEMR LAB
Metaneph Total, Ur: 396 ug/24hr (ref 224–832)
Normetanephrine, 24H Ur: 285 (ref 122–676)

## 2009-07-25 ENCOUNTER — Ambulatory Visit (HOSPITAL_BASED_OUTPATIENT_CLINIC_OR_DEPARTMENT_OTHER): Admission: RE | Admit: 2009-07-25 | Discharge: 2009-07-25 | Payer: Self-pay | Admitting: Pulmonary Disease

## 2009-07-25 ENCOUNTER — Encounter: Payer: Self-pay | Admitting: Pulmonary Disease

## 2009-08-02 ENCOUNTER — Ambulatory Visit: Payer: Self-pay | Admitting: Internal Medicine

## 2009-08-03 ENCOUNTER — Telehealth: Payer: Self-pay | Admitting: Internal Medicine

## 2009-08-06 ENCOUNTER — Telehealth: Payer: Self-pay | Admitting: Internal Medicine

## 2009-08-06 ENCOUNTER — Ambulatory Visit: Payer: Self-pay | Admitting: Pulmonary Disease

## 2009-08-09 ENCOUNTER — Telehealth (INDEPENDENT_AMBULATORY_CARE_PROVIDER_SITE_OTHER): Payer: Self-pay | Admitting: *Deleted

## 2009-08-11 ENCOUNTER — Ambulatory Visit: Payer: Self-pay | Admitting: Pulmonary Disease

## 2009-08-12 ENCOUNTER — Telehealth: Payer: Self-pay | Admitting: Internal Medicine

## 2009-09-09 ENCOUNTER — Telehealth: Payer: Self-pay | Admitting: Internal Medicine

## 2009-09-13 ENCOUNTER — Telehealth: Payer: Self-pay | Admitting: Pulmonary Disease

## 2009-09-16 ENCOUNTER — Ambulatory Visit: Payer: Self-pay | Admitting: Pulmonary Disease

## 2009-09-27 ENCOUNTER — Encounter: Payer: Self-pay | Admitting: Pulmonary Disease

## 2009-09-27 ENCOUNTER — Telehealth: Payer: Self-pay | Admitting: Pulmonary Disease

## 2009-09-28 ENCOUNTER — Ambulatory Visit: Payer: Self-pay | Admitting: Pulmonary Disease

## 2009-09-28 DIAGNOSIS — G4730 Sleep apnea, unspecified: Secondary | ICD-10-CM

## 2009-09-28 DIAGNOSIS — G4710 Hypersomnia, unspecified: Secondary | ICD-10-CM | POA: Insufficient documentation

## 2009-09-30 ENCOUNTER — Encounter (INDEPENDENT_AMBULATORY_CARE_PROVIDER_SITE_OTHER): Payer: Self-pay | Admitting: *Deleted

## 2009-09-30 DIAGNOSIS — Z6833 Body mass index (BMI) 33.0-33.9, adult: Secondary | ICD-10-CM | POA: Insufficient documentation

## 2009-09-30 DIAGNOSIS — E663 Overweight: Secondary | ICD-10-CM

## 2009-10-06 ENCOUNTER — Ambulatory Visit: Payer: Self-pay | Admitting: Internal Medicine

## 2009-10-06 LAB — CONVERTED CEMR LAB
Bilirubin Urine: NEGATIVE
Glucose, Urine, Semiquant: NEGATIVE
Ketones, ur: NEGATIVE mg/dL
Ketones, urine, test strip: NEGATIVE
Specific Gravity, Urine: 1.01 (ref 1.000–1.030)
Urine Glucose: NEGATIVE mg/dL
Urobilinogen, UA: 0.2
Urobilinogen, UA: 0.2 (ref 0.0–1.0)
WBC Urine, dipstick: NEGATIVE
pH: 5

## 2009-10-07 ENCOUNTER — Ambulatory Visit: Payer: Self-pay | Admitting: Cardiology

## 2009-10-11 ENCOUNTER — Telehealth: Payer: Self-pay | Admitting: Internal Medicine

## 2009-10-12 ENCOUNTER — Ambulatory Visit: Payer: Self-pay | Admitting: Internal Medicine

## 2009-10-13 ENCOUNTER — Encounter: Payer: Self-pay | Admitting: Internal Medicine

## 2009-11-02 ENCOUNTER — Telehealth: Payer: Self-pay | Admitting: Internal Medicine

## 2009-11-03 ENCOUNTER — Ambulatory Visit: Payer: Self-pay | Admitting: Gastroenterology

## 2009-11-08 ENCOUNTER — Encounter: Payer: Self-pay | Admitting: Internal Medicine

## 2009-11-08 ENCOUNTER — Telehealth: Payer: Self-pay | Admitting: Internal Medicine

## 2009-11-24 ENCOUNTER — Telehealth: Payer: Self-pay | Admitting: Internal Medicine

## 2009-11-25 ENCOUNTER — Ambulatory Visit: Payer: Self-pay | Admitting: Internal Medicine

## 2009-11-25 DIAGNOSIS — R109 Unspecified abdominal pain: Secondary | ICD-10-CM | POA: Insufficient documentation

## 2009-11-25 LAB — CONVERTED CEMR LAB
BUN: 11 mg/dL (ref 6–23)
Bilirubin Urine: NEGATIVE
Creatinine, Ser: 0.8 mg/dL (ref 0.4–1.2)
GFR calc non Af Amer: 93.75 mL/min (ref >60)
Glucose, Bld: 98 mg/dL (ref 70–99)
Hemoglobin, Urine: NEGATIVE
Potassium: 3.9 meq/L (ref 3.5–5.1)
Total Protein, Urine: NEGATIVE mg/dL
pH: 5.5 (ref 5.0–8.0)

## 2009-11-26 ENCOUNTER — Encounter: Payer: Self-pay | Admitting: Pulmonary Disease

## 2009-11-26 ENCOUNTER — Telehealth: Payer: Self-pay | Admitting: Internal Medicine

## 2009-11-26 ENCOUNTER — Ambulatory Visit (HOSPITAL_BASED_OUTPATIENT_CLINIC_OR_DEPARTMENT_OTHER): Admission: RE | Admit: 2009-11-26 | Discharge: 2009-11-26 | Payer: Self-pay | Admitting: Pulmonary Disease

## 2009-12-07 ENCOUNTER — Ambulatory Visit: Payer: Self-pay | Admitting: Pulmonary Disease

## 2009-12-10 ENCOUNTER — Ambulatory Visit: Payer: Self-pay | Admitting: Pulmonary Disease

## 2009-12-23 ENCOUNTER — Encounter: Payer: Self-pay | Admitting: Internal Medicine

## 2010-01-06 ENCOUNTER — Encounter: Payer: Self-pay | Admitting: Pulmonary Disease

## 2010-01-10 ENCOUNTER — Ambulatory Visit: Payer: Self-pay | Admitting: Internal Medicine

## 2010-01-11 ENCOUNTER — Encounter: Payer: Self-pay | Admitting: Pulmonary Disease

## 2010-01-25 ENCOUNTER — Ambulatory Visit: Payer: Self-pay | Admitting: Pulmonary Disease

## 2010-02-09 ENCOUNTER — Encounter: Payer: Self-pay | Admitting: Internal Medicine

## 2010-02-14 ENCOUNTER — Telehealth: Payer: Self-pay | Admitting: Internal Medicine

## 2010-04-19 ENCOUNTER — Telehealth: Payer: Self-pay | Admitting: Internal Medicine

## 2010-05-24 ENCOUNTER — Ambulatory Visit
Admission: RE | Admit: 2010-05-24 | Discharge: 2010-05-24 | Payer: Self-pay | Source: Home / Self Care | Attending: Internal Medicine | Admitting: Internal Medicine

## 2010-05-24 ENCOUNTER — Encounter: Payer: Self-pay | Admitting: Internal Medicine

## 2010-05-25 ENCOUNTER — Other Ambulatory Visit: Payer: Self-pay

## 2010-05-26 ENCOUNTER — Other Ambulatory Visit: Payer: Self-pay | Admitting: Internal Medicine

## 2010-05-26 ENCOUNTER — Other Ambulatory Visit: Payer: Self-pay

## 2010-05-26 ENCOUNTER — Encounter (INDEPENDENT_AMBULATORY_CARE_PROVIDER_SITE_OTHER): Payer: Self-pay | Admitting: *Deleted

## 2010-05-26 DIAGNOSIS — R5383 Other fatigue: Secondary | ICD-10-CM

## 2010-05-26 DIAGNOSIS — R5381 Other malaise: Secondary | ICD-10-CM

## 2010-05-26 LAB — BASIC METABOLIC PANEL
CO2: 31 mEq/L (ref 19–32)
Calcium: 8.7 mg/dL (ref 8.4–10.5)
Glucose, Bld: 92 mg/dL (ref 70–99)
Sodium: 137 mEq/L (ref 135–145)

## 2010-05-26 LAB — CBC WITH DIFFERENTIAL/PLATELET
Eosinophils Relative: 3.9 % (ref 0.0–5.0)
HCT: 37.5 % (ref 36.0–46.0)
Hemoglobin: 12.7 g/dL (ref 12.0–15.0)
Lymphs Abs: 1.8 10*3/uL (ref 0.7–4.0)
Monocytes Relative: 8.8 % (ref 3.0–12.0)
Platelets: 272 10*3/uL (ref 150.0–400.0)
WBC: 3.8 10*3/uL — ABNORMAL LOW (ref 4.5–10.5)

## 2010-05-26 LAB — T4, FREE: Free T4: 0.74 ng/dL (ref 0.60–1.60)

## 2010-05-26 NOTE — Progress Notes (Signed)
Summary: RESULTS  Phone Note From Other Clinic   Summary of Call: lm for pt to call back Initial call taken by: Ami Bullins CMA,  November 26, 2009 2:33 PM Reason for Call: Discuss lab or test results Summary of Call: please call patient: U/A normal - no blood. Metabolic panel normal.  Thanks Initial call taken by: Jacques Navy MD,  November 26, 2009 9:15 AM  Follow-up for Phone Call        Pt informed  Follow-up by: Lamar Sprinkles, CMA,  November 26, 2009 4:50 PM

## 2010-05-26 NOTE — Assessment & Plan Note (Signed)
Summary: apena link only/cb   Allergies: 1)  ! Morphine 2)  ! Sulfa 3)  ! Phenergan   Other Orders: Sleep Std Airflow/Heartrate and O2 SAT unattended (04540)

## 2010-05-26 NOTE — Progress Notes (Signed)
Summary: nos appt  Phone Note Call from Patient   Caller: [email or web address]  Call For: Jennifer [name] Summary of Call: Rsc nos from 5/20 to 6/17 @ 3:15p. Initial call taken by: Darletta Moll,  Sep 13, 2009 9:38 AM     Appended Document: nos appt pl call her & find out if hse has obtained the positional device yet for obstructive sleep apnea ?  Appended Document: nos appt Pt states she has had same approx 3 weeks now and is sorry she did not call as instructed. Pt states husband c/o pt still snoring and stops breathing when on her side.   Appended Document: Orders Update let her know we will check home sleep study  - use device on nihgt of study arrange OV after  Pt picked up apnea link on Friday 09/24/09 and device was returned and downloaded on Monday 09/27/09. Appt scheduled for 09/28/09. Alfonso Ramus  September 28, 2009 9:41 AM   Clinical Lists Changes  Orders: Added new Referral order of Sleep Disorder Referral (Sleep Disorder) - Signed Added new Service order of Sleep Std Airflow/Heartrate and O2 SAT unattended (11914) - Signed

## 2010-05-26 NOTE — Miscellaneous (Signed)
Summary: PT Re-Eval / [facility]  PT Re-Eval / [facility]   Imported By: Lennie Odor 03/01/2010 11:58:40  _____________________________________________________________________  External Attachment:    Type:   Image     Comment:   External Document

## 2010-05-26 NOTE — Assessment & Plan Note (Signed)
Summary: f/u sleep study/LC   Copy to:  n/a Primary Provider/Referring Provider:  Illene Regulus, MD  CC:  Sleep study results.  History of Present Illness: 58/F, never smoker with positional obstructive sleep apnea. She denies excessive daytime somnolence &  reports Epworth Sleepiness Score as 1/24.Husband has witnessed apneas & gasping episodes in her sleep. She feels tired even after 8-10 hrs of sleep.  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 She stays in bed late on weekends. Sleep latency without restoril is 2-3 h, she has been taking this x 2 yrs. There are 3-4 spontaneous awakenings , no post void latency.  PSG in 10/04 when she weighed 210 lbs showed a TST of 250 mins incl 37 mins of REM, RDI was 15/h with lowest desaturation of 84% & modearte snoring, predominantly REM related events. She now weighs 256lbs.   August 11, 2009 1:40 PM  reviewed PSG >>Mild-to-moderate obstructive sleep apnea with events predominantly  during supine sleep causing sleep fragmentation and oxygen   desaturation. RDI was 17/h, non supine AHI was only 1.7/h  Current Medications (verified): 1)  Aciphex 20 Mg  Tbec (Rabeprazole Sodium) .... Take 1 Tablet By Mouth Two Times A Day 2)  Restoril 30 Mg Caps (Temazepam) .... Take 1 Tab By Mouth At Bedtime 3)  Triamterene-Hctz 37.5-25 Mg Tabs (Triamterene-Hctz) .... 1/2 Tab Once Daily 4)  Aspirin 325 Mg  Tabs (Aspirin) .... Take 1 Tablet By Mouth Once A Day  Allergies (verified): 1)  ! Morphine 2)  ! Sulfa 3)  ! 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  Patient does not get regular exercise.   Review of Systems       The patient complains of weight gain.  The patient denies anorexia, fever, weight loss, vision loss, decreased hearing, hoarseness, chest pain, syncope, dyspnea on exertion, peripheral edema, prolonged cough, headaches, hemoptysis, abdominal pain, melena, hematochezia, severe indigestion/heartburn, hematuria, muscle weakness, difficulty walking, depression, unusual weight change, and abnormal bleeding.    Vital Signs:  Patient profile:   60 year old female Height:      68.5 inches Weight:      258.25 pounds BMI:     38.84 O2 Sat:      95 % on Room air Temp:     98.1 degrees F oral Pulse rate:   84 / minute BP sitting:   126 / 84  (right arm) Cuff size:   regular  Vitals Entered By: Carver Fila SMA (August 11, 2009 1:40 PM)  O2 Flow:  Room air CC: Sleep study results Is Patient Diabetic? No Pain Assessment Patient in pain? no      Comments Meds updated   Physical Exam  Additional Exam:  Gen. Pleasant, well-nourished, in no distress, normal affect ENT - no lesions, no post nasal drip, class 2 airway Neck: No JVD, no thyromegaly, no carotid bruits Lungs: no use of accessory muscles, no dullness to  percussion, clear without rales or rhonchi  Cardiovascular: Rhythm regular, heart sounds  normal, no murmurs or gallops, no peripheral edema Musculoskeletal: No deformities, no cyanosis or clubbing      Impression & Recommendations:  Problem # 1:  SLEEP APNEA (ICD-780.57)  she has a stron positional component ,nonsupine AHI is very low. It may be worthwhile to try positional device - given information to get this . Once settled in with this, will repeat portable study with device to see if events  corrected. If unable to use, will try CPAP again with nasal pillows.  Orders: Est. Patient Level III (81191)  Patient Instructions: 1)  Copy sent to: Dr Debby Bud 2)  Please schedule a follow-up appointment in 1 month. 3)  Call me once you have used the position device x 1-2 weeks 4)  We will then do a portable study at home

## 2010-05-26 NOTE — Progress Notes (Signed)
Summary: Triage   Phone Note Call from Patient Call back at Home Phone [phone]   Caller: Patient Call For: Dr. Leone Payor Reason for Call: Talk to Nurse Summary of Call: pt. needs a note for work for today b/c she worked a 1/2 day and  is leaving early b/c she continues to have diarrhea Initial call taken by: Karna Christmas,  November 08, 2009 12:02 PM  Follow-up for Phone Call        Dr Leone Payor patient was seen last week by PAula she prescribed levsin for her IBS, is it ok to give her work note? Follow-up by: Darcey Nora RN, CGRN,  November 08, 2009 12:12 PM  Additional Follow-up for Phone Call Additional follow up Details #1::        yes Additional Follow-up by: Iva Boop MD, Clementeen Graham,  November 08, 2009 1:46 PM    Additional Follow-up for Phone Call Additional follow up Details #2::    patient advised to come pick up note at the front desk Follow-up by: Darcey Nora RN, CGRN,  November 08, 2009 2:01 PM

## 2010-05-26 NOTE — Assessment & Plan Note (Signed)
Summary: rov after titration study/apc   Visit Type:  Follow-up Copy to:  n/a Primary Provider/Referring Provider:  Illene Regulus, MD  CC:  Pt here for follow up after titration study.  History of Present Illness: 58/F, never smoker with positional obstructive sleep apnea.   PSG in 10/04 when she weighed 210 lbs showed a TST of 250 mins incl 37 mins of REM, RDI was 15/h with lowest desaturation of 84% & modearte snoring, predominantly REM related events. She now weighs 256lbs.   August 11, 2009 1:40 PM  reviewed PSG >>Mild-to-moderate obstructive sleep apnea with events predominantly  during supine sleep causing sleep fragmentation and oxygen   desaturation. RDI was 17/h, non supine AHI was only 1.7/h  September 28, 2009 9:48 AM  husband states less nsoring with positional device , but she is still tired.  potrbale study (using positional device) shows AHI of 10/h with desaturation index 12/h .  December 07, 2009 1:53 PM  psg (250 lbs)  - cpap titrated to 9 cm, smal full face mask agreeable to using CPAP, felt refreshed the mornign after.  Preventive Screening-Counseling & Management  Alcohol-Tobacco     Smoking Status: never  Current Medications (verified): 1)  Aciphex 20 Mg  Tbec (Rabeprazole Sodium) .... Take 1 Tablet By Mouth Two Times A Day 2)  Restoril 30 Mg Caps (Temazepam) .... Take 1 Tab By Mouth At Bedtime 3)  Triamterene-Hctz 37.5-25 Mg Tabs (Triamterene-Hctz) .... 1/2 Tab Once Daily 4)  Aspirin 325 Mg  Tabs (Aspirin) .... Take 1 Tablet By Mouth Once A Day 5)  Alprazolam 0.25 Mg Tabs (Alprazolam) .Marland Kitchen.. 1 Q 6 As Needed Anxiety 6)  Hydrocodone-Acetaminophen 5-325 Mg Tabs (Hydrocodone-Acetaminophen) .Marland Kitchen.. 1 By Mouth Q 6 As Needed 7)  Lidoderm 5 % Ptch (Lidocaine) .... Apply To Sore Flank Left On 12 Off 12. 8)  Levsin/sl 0.125 Mg Subl (Hyoscyamine Sulfate) .... Take 1 Three Times A Day For 4 Days Then Take As Needed  Allergies (verified): 1)  ! Morphine 2)  ! Sulfa 3)  !  Phenergan  Past History:  Past Medical History: Last updated: 10/23/2008 ESOPHAGEAL STENOSIS/STRICTURE AFTER FUNDOPLIACTION REDO SUBJECTIVE TINNITUS  MITRAL VALVE PROLAPSE IRRITABLE BOWEL SYNDROME  GERD  COSTOCHONDRITIS OSA-mild  sleep study '04 Pulmonary embolus 04/2008 Chronic Chest pain Chronic dysphagia  Social History: Last updated: 10/06/2009 [employer] graduate - technical school married '70- 2 years divorced; married '86 - 2.5 years divorced; married '90 - 1 year divorced; married '00 1 son - '36 Sister - died @ 40 massive MI work: [employer] Alcohol Use - no Patient has never smoked.  Patient does not get regular exercise.   Review of Systems  The patient denies anorexia, fever, weight loss, weight gain, vision loss, decreased hearing, hoarseness, chest pain, syncope, dyspnea on exertion, peripheral edema, prolonged cough, headaches, hemoptysis, abdominal pain, melena, hematochezia, severe indigestion/heartburn, hematuria, muscle weakness, suspicious skin lesions, difficulty walking, depression, unusual weight change, and abnormal bleeding.    Vital Signs:  Patient profile:   60 year old female Height:      68.5 inches Weight:      251 pounds BMI:     37.75 O2 Sat:      97 % on Room air Temp:     98.0 degrees F oral Pulse rate:   82 / minute BP sitting:   110 / 70  (left arm) Cuff size:   large  Vitals Entered By: Zackery Barefoot CMA (December 07, 2009 1:36 PM)  O2  Flow:  Room air CC: Pt here for follow up after titration study Comments Medications reviewed with patient Verified contact number and pharmacy with patient Zackery Barefoot CMA  December 07, 2009 1:36 PM    Physical Exam  Additional Exam:  Gen. Pleasant, well-nourished, in no distress, normal affect ENT - no lesions, no post nasal drip, class 2 airway Neck: No JVD, no thyromegaly, no carotid bruits Lungs: no use of accessory muscles, no dullness to percussion, clear without rales or rhonchi    Cardiovascular: Rhythm regular, heart sounds  normal, no murmurs or gallops, no peripheral edema Musculoskeletal: No deformities, no cyanosis or clubbing      Impression & Recommendations:  Problem # 1:  HYPERSOMNIA, ASSOCIATED WITH SLEEP APNEA (ICD-780.53) Start with CPAP 9 cm , small full face mask, humidity- downoad in 4 weeks The pathophysiology of obstructive sleep apnea, it's cardiovascular consequences and modes of treatment including CPAP were discussed with the patient in great detail.  Compliance encouraged, wt loss emphasized, asked to avoid meds with sedative side effects, cautioned against driving when sleepy.  If mask issues , wil change to nasal pillows but concern for mouth breathing. Orders: Est. Patient Level III (16109) DME Referral (DME)  Patient Instructions: 1)  Copy sent to:dr norins 2)  Please schedule a follow-up appointment in 6 weeks with download  3)  we will set you up with cpap machine

## 2010-05-26 NOTE — Assessment & Plan Note (Signed)
Summary: abdominal pain/sheric    History of Present Illness Visit Type: Follow-up Visit Primary GI MD: Stan Head MD Primary Fallon Howerter: Illene Regulus, MD Requesting Shirl Weir: na Chief Complaint: IBS History of Present Illness:   Saw Willette Cluster NP in july with diarrhea. Received Anucort suppositories in August at visit with Dr. Debby Bud.  She is describing alternating between diarrhea and constipation. Some minor rectal bleeding. She is also having dysphagia again, when she takes the first bite, she waits and after 10 minutes it relaxes and it will go down but not all the way and she regurgiates. Hot liquids or eating soft foods helps. She has never had complete relief of this even after dilation and has had many.  Hyoscyamine sublingual helps with colon.   GI Review of Systems    Reports abdominal pain and  bloating.     Location of  Abdominal pain: lower abdomen.    Denies acid reflux, belching, chest pain, dysphagia with liquids, dysphagia with solids, heartburn, loss of appetite, nausea, vomiting, vomiting blood, weight loss, and  weight gain.      Reports diarrhea and  irritable bowel syndrome.     Denies anal fissure, black tarry stools, change in bowel habit, constipation, diverticulosis, fecal incontinence, heme positive stool, hemorrhoids, jaundice, light color stool, liver problems, rectal bleeding, and  rectal pain. EGD  Procedure date:  01/04/2007  Findings:      Comments: 1) S/P 58, 60 FR ESOPHAGEAL DILATION TODAY 2) 62 FR UNSUCCESSFUL, MINOR MUCOSAL DISRUPTION. I DO NOT THINK THERE IS A SIGNIFICANT TEAR  Location: [facility]    Colonoscopy  Procedure date:  09/03/2007  Findings:      Results: Hemorrhoids. Results: Diverticulosis. Pathology:  Hyperplastic polyp. Location:  [facility].   Comments: 1) 5 MM DESCENDING COLON POLYP REMOVED 2) DIVERTICULOSIS 3) SMALL INTERNAL HEMORRHOIDS 4) OTHERWISE NORMAL, EXCELLENT  PREP 5) I THINK SYMPTOMS AND SIGNS ARE DUE TO IBS  ***MICROSCOPIC EXAMINATION AND DIAGNOSIS***    COLON, DESCENDING, POLYP, BIOPSY:   - INFLAMED PARTIALLY ULCERATED HYPERPLASTIC POLYP.   - NO ADENOMATOUS CHANGE OR MALIGNANCY IDENTIFIED.  Comments:      Repeat colonoscopy in 5 years.   Procedures Next Due Date:    Colonoscopy: 08/2012     Current Medications (verified): 1)  Aciphex 20 Mg  Tbec (Rabeprazole Sodium) .... Take 1 Tablet By Mouth Two Times A Day 2)  Restoril 30 Mg Caps (Temazepam) .... Take 1 Tab By Mouth At Bedtime 3)  Triamterene-Hctz 37.5-25 Mg Tabs (Triamterene-Hctz) .... 1/2 Tab Once Daily 4)  Aspirin 325 Mg  Tabs (Aspirin) .... Take 1 Tablet By Mouth Once A Day 5)  Alprazolam 0.25 Mg Tabs (Alprazolam) .Marland Kitchen.. 1 Q 6 As Needed Anxiety 6)  Lidoderm 5 % Ptch (Lidocaine) .... Apply To Sore Flank Left On 12 Off 12. 7)  Levsin/sl 0.125 Mg Subl (Hyoscyamine Sulfate) .... Take 1 Three Times A Day For 4 Days Then Take As Needed  Allergies (verified): 1)  ! Morphine 2)  ! Sulfa 3)  ! Phenergan  Past History:  Past Medical History: Reviewed history from 10/23/2008 and no changes required. ESOPHAGEAL STENOSIS/STRICTURE AFTER FUNDOPLIACTION REDO SUBJECTIVE TINNITUS  MITRAL VALVE PROLAPSE IRRITABLE BOWEL SYNDROME  GERD  COSTOCHONDRITIS OSA-mild  sleep study '04 Pulmonary embolus 04/2008 Chronic Chest pain Chronic dysphagia  Past Surgical History: Reviewed history from 11/03/2009 and no changes required. BRAIN STEM SURGERY, ARNOLD-CHIARI MALFORMATION, 1995 REDUCTION MAMMOPLASTY APPENDECTOMY, HX OF (ICD-V45.79) HYSTERECTOMY, HX OF (ICD-V45.77) NISSEN FUNDOPLICATION  X 3 (lap, open x2, last with mesh) S/P CHOLECYSTECTOMY '08  Family History: Reviewed history from 06/24/2009 and no changes required. father- deceased @52 : died in his sleep mohter - deceased @ 32: DM complications, RA 4 sisters with RA Family History of Colon Cancer:Maternal Grandmother  htn: both  parents and several sibs  Social History: Reviewed history from 10/06/2009 and no changes required. [employer] graduate - technical school married '70- 2 years divorced; married '86 - 2.5 years divorced; married '90 - 1 year divorced; married '00 1 son - '69 Sister - died @ 23 massive MI work: [employer] Alcohol Use - no Patient has never smoked.  Patient does not get regular exercise.   Review of Systems       c/o edema of legs/feet  Vital Signs:  Patient profile:   60 year old female Height:      68.5 inches Weight:      257 pounds BMI:     38.65 BSA:     2.29 Pulse rate:   88 / minute Pulse rhythm:   regular BP sitting:   128 / 62  (left arm) Cuff size:   large  Vitals Entered By: Ok Anis CMA (January 10, 2010 2:46 PM)  Physical Exam  General:  obese.  NAD Eyes:  anicteric Lungs:  clear ant Heart:  normal rate, regular rhythm, and no rub.   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 Baldo Daub last year. She tried a tricyclic without benefit retry Cymbalta, he had recommended that and follow-up, which she did not keep I explained rationaleof using anti-depressants to  treat pain, hypersensitivity and not depression I explained that it takes months before we can see full effect for this chrionic problem do not think dilation makes sense as ashe has never gotten long-lasting relief  Patient Instructions: 1)  start Cymbalta as written, take at bedtime 2)  I think this may help your swallowing and the IBS. 3)  Add Benefiber 1 packet or tablespoon in 6-8oz water daily. 4)  Please schedule a  follow-up appointment in 3 months. 5)  Please pick up your medications at your pharmacy. CYMBALTA 6)  Please continue using Levsin as needed. 7)  The medication list was reviewed and reconciled.  All changed / newly prescribed medications were explained.  A complete medication list was provided to the patient / caregiver. Prescriptions: CYMBALTA 30 MG  CPEP (DULOXETINE HCL) 1 by mouth once daily x 2 weeks then  2 by mouth once daily Brand medically necessary #60 x 3   Entered and Authorized by:   Iva Boop MD, [facility]   Signed by:   Iva Boop MD, [facility] on 01/10/2010   Method used:   Electronically to        [facility]* (retail)       [address] [place], [place]  [address]       Ph: [phone]       Fax: [phone]   RxID:   [id number]   Appended Document: abdominal pain/sheric download 8/23-9/20/11 on 9  cm >> good compliance, residual AHI 2.5/h

## 2010-05-26 NOTE — Progress Notes (Signed)
Summary: RF  Phone Note Refill Request Message from:  Pharmacy  Refills Requested: Medication #1:  RESTORIL 30 MG CAPS Take 1 tab by mouth at bedtime Initial call taken by: Lamar Sprinkles, CMA,  April 19, 2010 12:36 PM  Follow-up for Phone Call        ok for as needed refills Follow-up by: Jacques Navy MD,  April 19, 2010 1:05 PM    Prescriptions: RESTORIL 30 MG CAPS (TEMAZEPAM) Take 1 tab by mouth at bedtime  #30 x 1   Entered by:   Ami Bullins CMA   Authorized by:   Jacques Navy MD   Signed by:   Bill Salinas CMA on 04/19/2010   Method used:   Telephoned to ...       [facility]* (retail)       [address] [place], [place]  [address]       Ph: [phone]       Fax: [phone]   RxID:   [id number]

## 2010-05-26 NOTE — Assessment & Plan Note (Signed)
Summary: per phone note/#/cd   Vital Signs:  Patient profile:   60 year old female Height:      68.5 inches Weight:      254 pounds BMI:     38.20 O2 Sat:      95 % on Room air Temp:     97.7 degrees F oral Pulse rate:   75 / minute BP sitting:   126 / 88  (left arm) Cuff size:   regular  Vitals Entered By: Bill Salinas CMA (October 12, 2009 9:35 AM)  O2 Flow:  Room air CC: pt here with continued left flank pain/ ab   Primary Care Provider:  Illene Regulus, MD  CC:  pt here with continued left flank pain/ ab.  History of Present Illness: returns for persistent pain in the left low back that is worse with movement. She has had a negative CT abd/pelvis. U/A was negative. No signs of infection.  Current Medications (verified): 1)  Aciphex 20 Mg  Tbec (Rabeprazole Sodium) .... Take 1 Tablet By Mouth Two Times A Day 2)  Restoril 30 Mg Caps (Temazepam) .... Take 1 Tab By Mouth At Bedtime 3)  Triamterene-Hctz 37.5-25 Mg Tabs (Triamterene-Hctz) .... 1/2 Tab Once Daily 4)  Aspirin 325 Mg  Tabs (Aspirin) .... Take 1 Tablet By Mouth Once A Day 5)  Alprazolam 0.25 Mg Tabs (Alprazolam) .Marland Kitchen.. 1 Q 6 As Needed Anxiety 6)  Hydrocodone-Acetaminophen 5-325 Mg Tabs (Hydrocodone-Acetaminophen) .Marland Kitchen.. 1 By Mouth Q 6 As Needed  Allergies (verified): 1)  ! Morphine 2)  ! Sulfa 3)  ! Phenergan PMH-FH-SH reviewed-no changes except otherwise noted  Review of Systems  The patient denies anorexia, fever, weight loss, weight gain, vision loss, decreased hearing, chest pain, syncope, peripheral edema, headaches, abdominal pain, severe indigestion/heartburn, genital sores, transient blindness, depression, and enlarged lymph nodes.    Physical Exam  General:  alert, well-developed, and well-nourished.   Head:  normocephalic and atraumatic.   Lungs:  normal respiratory effort, normal breath sounds, no crackles, and no wheezes.   Heart:  normal rate and regular rhythm.   Abdomen:  soft, normal bowel  sounds, and no guarding.   Msk:  very tender at the left flank T12-L2 level in the paravetebral musculature. Can flex, toe/heel walk, hurts to stretch up.   Impression & Recommendations:  Problem # 1:  FLANK PAIN, LEFT (ICD-789.09) Internal disease not identified on CT. Exam c/w MSK related pain.   Plan - lidoderm patch - to affected area - on 12 off 12          back stretching exercise.  Her updated medication list for this problem includes:    Aspirin 325 Mg Tabs (Aspirin) .Marland Kitchen... Take 1 tablet by mouth once a day    Hydrocodone-acetaminophen 5-325 Mg Tabs (Hydrocodone-acetaminophen) .Marland Kitchen... 1 by mouth q 6 as needed  Complete Medication List: 1)  Aciphex 20 Mg Tbec (Rabeprazole sodium) .... Take 1 tablet by mouth two times a day 2)  Restoril 30 Mg Caps (Temazepam) .... Take 1 tab by mouth at bedtime 3)  Triamterene-hctz 37.5-25 Mg Tabs (Triamterene-hctz) .... 1/2 tab once daily 4)  Aspirin 325 Mg Tabs (Aspirin) .... Take 1 tablet by mouth once a day 5)  Alprazolam 0.25 Mg Tabs (Alprazolam) .Marland Kitchen.. 1 q 6 as needed anxiety 6)  Hydrocodone-acetaminophen 5-325 Mg Tabs (Hydrocodone-acetaminophen) .Marland Kitchen.. 1 by mouth q 6 as needed 7)  Lidoderm 5 % Ptch (Lidocaine) .... Apply to sore flank left on 12 off 12.  Patient Instructions: 1)  Pain in left flank - normal CT scan. Suspect this is a muscular pain. Plan - apply lidoderm patch to the area of greatest tenderness, on 12hrs off 12 hrs. Take generic aleve two times a day. Stretch exercises. Prescriptions: LIDODERM 5 % PTCH (LIDOCAINE) apply to sore flank left on 12 off 12.  #10 x 6   Entered and Authorized by:   Jacques Navy MD   Signed by:   Jacques Navy MD on 10/12/2009   Method used:   Electronically to        [facility]* (retail)       [address] [place], [place]  [address]       Ph: [phone]       Fax: [phone]   RxID:   [id number]

## 2010-05-26 NOTE — Progress Notes (Signed)
Summary: triage   Phone Note Call from Patient Call back at [phone]   Caller: Patient Call For: Dr. Leone Payor Reason for Call: Talk to Nurse Summary of Call: would like to be worked in for lower back pain... pt thinks it is GI related Initial call taken by: Vallarie Mare,  November 24, 2009 4:41 PM  Follow-up for Phone Call        Left message for patient to call back Darcey Nora RN, [facility]  November 25, 2009 8:25 AM  Patient  is going to see Dr Arthur Holms today , she is scheduled to see Dr Leone Payor for 01/10/10 2:30 due to nausea and intermittent rectal bleeding. Follow-up by: Darcey Nora RN, CGRN,  November 25, 2009 11:48 AM

## 2010-05-26 NOTE — Progress Notes (Signed)
  Phone Note Refill Request Message from:  Fax from Pharmacy on February 14, 2010 9:41 AM  Refills Requested: Medication #1:  RESTORIL 30 MG CAPS Take 1 tab by mouth at bedtime   Last Refilled: 12/28/2009 [phone] Last ov 11/25/09 Is this ok to refill?  Next Appointment Scheduled: none Initial call taken by: Orlan Leavens RMA,  February 14, 2010 9:42 AM  Follow-up for Phone Call        ok to refill as needed  Follow-up by: Jacques Navy MD,  February 14, 2010 10:30 AM    Prescriptions: RESTORIL 30 MG CAPS (TEMAZEPAM) Take 1 tab by mouth at bedtime  #30 x 1   Entered by:   Ami Bullins CMA   Authorized by:   Jacques Navy MD   Signed by:   Bill Salinas CMA on 02/14/2010   Method used:   Telephoned to ...       [facility]* (retail)       [address] [place], [place]  [address]       Ph: [phone]       Fax: [phone]   RxID:   [id number]

## 2010-05-26 NOTE — Letter (Signed)
Summary: Disability note for patient  Disability note for patient   Imported By: Lester Broadus 10/28/2009 08:05:28  _____________________________________________________________________  External Attachment:    Type:   Image     Comment:   External Document

## 2010-05-26 NOTE — Progress Notes (Signed)
Summary: REFERRAL  Phone Note Call from Patient   Summary of Call: Patient is requesting referral to nutritionist.  Initial call taken by: Lamar Sprinkles, CMA,  August 06, 2009 1:21 PM  Follow-up for Phone Call        refer to Maryan Puls, MS, RD [phone] for consulation on weight loss, hypertension. [facility] notified Follow-up by: Jacques Navy MD,  August 07, 2009 4:57 PM

## 2010-05-26 NOTE — Assessment & Plan Note (Signed)
Summary: 6 week follow up with download in HP//jwr   Visit Type:  Follow-up Copy to:  Gauge Winski Primary Provider/Referring Provider:  Illene Regulus, MD  CC:  Pt here for follow up. Pt c/o increased intermittent fatigue. Pt denies headaches. She states is using CPAP everynight. Pt requesting flu vaccine.  History of Present Illness: 58/F, never smoker with positional obstructive sleep apnea.   PSG in 10/04 when she weighed 210 lbs showed a TST of 250 mins incl 37 mins of REM, RDI was 15/h with lowest desaturation of 84% & modearte snoring, predominantly REM related events. She now weighs 256lbs.   August 11, 2009 1:40 PM  reviewed PSG >>Mild-to-moderate obstructive sleep apnea with events predominantly  during supine sleep causing sleep fragmentation and oxygen   desaturation. RDI was 17/h, non supine AHI was only 1.7/h  September 28, 2009 9:48 AM  husband states less nsoring with positional device , but she is still tired.  potrbale study (using positional device) shows AHI of 10/h with desaturation index 12/h .  January 25, 2010 10:36 AM  psg (250 lbs)  - cpap titrated to 9 cm, smal full face mask agreeable to using CPAP, felt refreshed the mornign after. Able to use CPAP, download 8/23- 01/11/10 >> no residual events, good compliance, minimal leak  Preventive Screening-Counseling & Management  Alcohol-Tobacco     Smoking Status: never  Current Medications (verified): 1)  Aciphex 20 Mg  Tbec (Rabeprazole Sodium) .... Take 1 Tablet By Mouth Two Times A Day 2)  Restoril 30 Mg Caps (Temazepam) .... Take 1 Tab By Mouth At Bedtime 3)  Triamterene-Hctz 37.5-25 Mg Tabs (Triamterene-Hctz) .... 1/2 Tab Once Daily 4)  Aspirin 325 Mg  Tabs (Aspirin) .... Take 1 Tablet By Mouth Once A Day 5)  Alprazolam 0.25 Mg Tabs (Alprazolam) .Marland Kitchen.. 1 Q 6 As Needed Anxiety 6)  Lidoderm 5 % Ptch (Lidocaine) .... Apply To Sore Flank Left On 12 Off 12 As Needed 7)  Levsin/sl 0.125 Mg Subl (Hyoscyamine Sulfate) .... Take  1 Three Times A Day For 4 Days Then Take As Needed 8)  Cymbalta 30 Mg  Cpep (Duloxetine Hcl) .... Take 1 Tab By Mouth At Bedtime  Allergies (verified): 1)  ! Morphine 2)  ! Sulfa 3)  ! Phenergan  Past History:  Past Medical History: Last updated: 10/23/2008 ESOPHAGEAL STENOSIS/STRICTURE AFTER FUNDOPLIACTION REDO SUBJECTIVE TINNITUS  MITRAL VALVE PROLAPSE IRRITABLE BOWEL SYNDROME  GERD  COSTOCHONDRITIS OSA-mild  sleep study '04 Pulmonary embolus 04/2008 Chronic Chest pain Chronic dysphagia  Social History: Last updated: 10/06/2009 [employer] graduate - technical school married '70- 2 years divorced; married '86 - 2.5 years divorced; married '90 - 1 year divorced; married '00 1 son - '19 Sister - died @ 76 massive MI work: [employer] Alcohol Use - no Patient has never smoked.  Patient does not get regular exercise.   Review of Systems  The patient denies anorexia, fever, weight loss, weight gain, vision loss, decreased hearing, hoarseness, chest pain, syncope, dyspnea on exertion, peripheral edema, prolonged cough, headaches, hemoptysis, abdominal pain, melena, severe indigestion/heartburn, hematuria, muscle weakness, suspicious skin lesions, difficulty walking, depression, unusual weight change, abnormal bleeding, enlarged lymph nodes, and angioedema.    Vital Signs:  Patient profile:   60 year old female Height:      68.5 inches Weight:      249 pounds BMI:     37.44 O2 Sat:      97 % on Room air Temp:  98.0 degrees F oral Pulse rate:   80 / minute BP sitting:   110 / 70  (left arm) Cuff size:   large  Vitals Entered By: Zackery Barefoot CMA (January 25, 2010 10:16 AM)  O2 Flow:  Room air CC: Pt here for follow up. Pt c/o increased intermittent fatigue. Pt denies headaches. She states is using CPAP everynight. Pt requesting flu vaccine Comments Medications reviewed with patient Verified contact number and pharmacy with patient Zackery Barefoot CMA  January 25, 2010 10:16  AM    Physical Exam  Additional Exam:  wt 249 January 25, 2010  Gen. Pleasant, well-nourished, in no distress, normal affect ENT - no lesions, no post nasal drip, class 2 airway Neck: No JVD, no thyromegaly, no carotid bruits Lungs: no use of accessory muscles, no dullness to percussion, clear without rales or rhonchi  Cardiovascular: Rhythm regular, heart sounds  normal, no murmurs or gallops, no peripheral edema Musculoskeletal: No deformities, no cyanosis or clubbing      Impression & Recommendations:  Problem # 1:  HYPERSOMNIA, ASSOCIATED WITH SLEEP APNEA (ICD-780.53)  pressure of 9 cm seems adequate, well tolerated, good compliance Compliance encouraged, wt loss emphasized, asked to avoid meds with sedative side effects, cautioned against driving when sleepy.  If she loses 30-40 lbs, will likley come off CPAP. Maintenance discussed  Orders: Est. Patient Level III (81191)  Medications Added to Medication List This Visit: 1)  Lidoderm 5 % Ptch (Lidocaine) .... Apply to sore flank left on 12 off 12 as needed 2)  Cymbalta 30 Mg Cpep (Duloxetine hcl) .... Take 1 tab by mouth at bedtime  Patient Instructions: 1)  Copy sent to: dr Debby Bud 2)  Please schedule a follow-up appointment in 6 months. 3)  You are expected to use CPAP at least 4-6 hrs evey night    Appended Document: Orders Update     Clinical Lists Changes  Orders: Added new Service order of Admin 1st Vaccine (47829) - Signed Added new Service order of Flu Vaccine 64yrs + [phone]) - Signed Observations: Added new observation of FLU VAX VIS: 11/16/09 version (01/25/2010 14:05) Added new observation of FLU VAXLOT: [id number] (01/25/2010 14:05) Added new observation of FLU VAXMFR: [employer] (01/25/2010 14:05) Added new observation of FLU VAX EXP: 10/22/2010 (01/25/2010 14:05) Added new observation of FLU VAX DSE: 0.65ml (01/25/2010 14:05) Added new observation of FLU VAX: Fluvax 3+ (01/25/2010 14:05)Flu  Vaccine Consent Questions     Do you have a history of severe allergic reactions to this vaccine? no    Any prior history of allergic reactions to egg and/or gelatin? no    Do you have a sensitivity to the preservative Thimersol? no    Do you have a past history of Guillan-Barre Syndrome? no    Do you currently have an acute febrile illness? no    Have you ever had a severe reaction to latex? no    Vaccine information given and explained to patient? yes    Are you currently pregnant? no    Lot Number:[id number]   Exp Date:10/22/2010   Site Given  Left Deltoid IMvation of FLU VAX EXP: 10/22/2010 (01/25/2010 14:05) Added new observation of FLU VAX DSE: 0.44ml (01/25/2010 14:05) Added new observation of FLU VAX: Fluvax 3+ (01/25/2010 14:05)Jessica Robinson CMA  January 25, 2010 2:06 PM      .lbflu

## 2010-05-26 NOTE — Assessment & Plan Note (Signed)
Summary: PAIN PATCH NOT WORKING STILL IN PAIN-LB   Vital Signs:  Patient profile:   60 year old female Height:      68.5 inches Weight:      255 pounds BMI:     38.35 O2 Sat:      98 % on Room air Temp:     97.8 degrees F oral Pulse rate:   87 / minute BP sitting:   140 / 82  (left arm) Cuff size:   regular  Vitals Entered By: Bill Salinas CMA (November 25, 2009 1:50 PM)  O2 Flow:  Room air CC: pt here with c/o ongoing mid back pain with no relief from lidoderm patch/ ab   Primary Care Provider:  Illene Regulus, MD  CC:  pt here with c/o ongoing mid back pain with no relief from lidoderm patch/ ab.  History of Present Illness: Jennifer [name] is a 60 year old African-American female with a 1 week history of left flank pain. The patient states this has been a chronic problem for her but the pain has become increasingly worse in the last week which she accredits to taking a 2 day hiatus of her heating pack treatments. The patient describes the pain as very deep, constant, and an 8 out of 10 in severity. Patient states the pain radiates only to her stomach giving her a feeling of nausea. The patient denies vomiting, dysuria, fever, chills, hematuria, history of trauma or intense exercise, or analgesic overuse. The pain is alleviated by the application of pressure and is refractory to heat packs and hydrocodone.  The patient also complains of constipation and hemorrhoids.   Current Medications (verified): 1)  Aciphex 20 Mg  Tbec (Rabeprazole Sodium) .... Take 1 Tablet By Mouth Two Times A Day 2)  Restoril 30 Mg Caps (Temazepam) .... Take 1 Tab By Mouth At Bedtime 3)  Triamterene-Hctz 37.5-25 Mg Tabs (Triamterene-Hctz) .... 1/2 Tab Once Daily 4)  Aspirin 325 Mg  Tabs (Aspirin) .... Take 1 Tablet By Mouth Once A Day 5)  Alprazolam 0.25 Mg Tabs (Alprazolam) .Marland Kitchen.. 1 Q 6 As Needed Anxiety 6)  Hydrocodone-Acetaminophen 5-325 Mg Tabs (Hydrocodone-Acetaminophen) .Marland Kitchen.. 1 By Mouth Q 6 As Needed 7)   Lidoderm 5 % Ptch (Lidocaine) .... Apply To Sore Flank Left On 12 Off 12. 8)  Levsin/sl 0.125 Mg Subl (Hyoscyamine Sulfate) .... Take 1 Three Times A Day For 4 Days Then Take As Needed  Allergies (verified): 1)  ! Morphine 2)  ! Sulfa 3)  ! Phenergan PMH-FH-SH reviewed-no changes except otherwise noted  Review of Systems       The patient complains of hoarseness, headaches, hematochezia, and severe indigestion/heartburn.  The patient denies fever, weight loss, weight gain, and vision loss.         denies any falls, night sweats, dysuria, cramps, joint stiffness, seizure, memory loss, breast discharge, breast mass, bruising, abnl sneezing, runny nose  reports tinnitus, palpitations, diarrhea, constipation  Physical Exam  General:  alert and overweight-appearing.   Head:  normocephalic and atraumatic.   Eyes:  pupils equal, pupils round, pupils reactive to light, corneas and lenses clear, and no injection.   Ears:  R ear normal, L ear normal, and no external deformities.   Nose:  no external deformity, no external erythema, no nasal discharge, and no mucosal pallor.   Mouth:  good dentition, no gingival abnormalities, pharynx pink and moist, no erythema, and no exudates.   Neck:  supple, full ROM, and no  masses.   Chest Wall:  no deformities, no tenderness, and no mass.   Lungs:  normal respiratory effort, normal breath sounds, no crackles, and no wheezes.   Heart:  normal rate, regular rhythm, and no rub.   Abdomen:  left upper quadrant pain to deep palpation, with no mass, no guarding no rebound. No tenderness in the LLQ. Msk:  normal ROM, no joint tenderness, no joint swelling, no joint warmth, and no redness over joints.  Patient with tenderness to deep palpation on the left chest at the posterior axillary line. Minimal tenderness to percussion over the left flank.  Pulses:  R radial normal and L radial normal.   Neurologic:  strength normal in all extremities, sensation intact to  light touch, sensation intact to pinprick, gait normal, and Romberg negative.   Skin:  turgor normal, color normal, and no rashes.   Cervical Nodes:  no anterior cervical adenopathy and no posterior cervical adenopathy.   Axillary Nodes:  No supra-clavicular adenopathy Psych:  normally interactive, good eye contact, not anxious appearing, and not depressed appearing.     Impression & Recommendations:  Problem # 1:  FLANK PAIN, LEFT (ICD-789.09) assessment - Left flank pain refractory to hydrocodone and heat packs.  plan -     Physical Therapy Referral (PT) at [facility] to manage what is likely to be a problem of muskoskeletal origin.               TLB-Udip w/ Micro (81001-URINE) to rule out potential kidney stone.  Problem # 2:  HEMORRHOIDS, INTERNAL (ICD-455.0) assessment - Chronic hemorrhoids  plan - Treat hemorrhoids and soften stool with a combination of suppositories, Senekot 2x/day, Mirilax, and Sitz baths.  Complete Medication List: 1)  Aciphex 20 Mg Tbec (Rabeprazole sodium) .... Take 1 tablet by mouth two times a day 2)  Restoril 30 Mg Caps (Temazepam) .... Take 1 tab by mouth at bedtime 3)  Triamterene-hctz 37.5-25 Mg Tabs (Triamterene-hctz) .... 1/2 tab once daily 4)  Aspirin 325 Mg Tabs (Aspirin) .... Take 1 tablet by mouth once a day 5)  Alprazolam 0.25 Mg Tabs (Alprazolam) .Marland Kitchen.. 1 q 6 as needed anxiety 6)  Hydrocodone-acetaminophen 5-325 Mg Tabs (Hydrocodone-acetaminophen) .Marland Kitchen.. 1 by mouth q 6 as needed 7)  Lidoderm 5 % Ptch (Lidocaine) .... Apply to sore flank left on 12 off 12. 8)  Levsin/sl 0.125 Mg Subl (Hyoscyamine sulfate) .... Take 1 three times a day for 4 days then take as needed 9)  Anucort-hc 25 Mg Supp (Hydrocortisone acetate) .Marland Kitchen.. 1 pr rectum two times a day for internal hemorrhoids  Other Orders: TLB-BMP (Basic Metabolic Panel-BMET) (80048-METABOL)  Patient: Jennifer [name] Note: All result statuses are Final unless otherwise noted.  Tests: (1)  BMP (METABOL)   Sodium                    141 mEq/L                   135-145   Potassium                 3.9 mEq/L                   3.5-5.1   Chloride                  102 mEq/L                   96-112   Carbon Dioxide  31 mEq/L                    19-32   Glucose                   98 mg/dL                    91-47   BUN                       11 mg/dL                    8-29   Creatinine                0.8 mg/dL                   5.6-2.1   Calcium                   8.8 mg/dL                   3.0-86.5   GFR                       93.75 mL/min                >60  Tests: (2) UDip w/Micro (URINE)   Color                     YELLOW       RANGE:  Yellow;Lt. Yellow   Clarity                   CLEAR                       Clear   Specific Gravity          >=1.030                     1.000 - 1.030   Urine Ph                  5.5                         5.0-8.0   Protein                   NEGATIVE                    Negative   Urine Glucose             NEGATIVE                    Negative   Ketones                   TRACE                       Negative   Urine Bilirubin           NEGATIVE                    Negative   Blood                     NEGATIVE  Negative   Urobilinogen              0.2                         0.0 - 1.0   Leukocyte Esterace        NEGATIVE                    Negative   Nitrite                   NEGATIVE                    Negative   Urine WBC                 0-2/hpf                     0-2/hpf   Urine Mucus               Presence of                 None   Urine Epith               Rare(0-4/hpf)               Rare(0-4/hpf)   Urine Bacteria            Few(10-50/hpf)              NonePrescriptions: ANUCORT-HC 25 MG SUPP (HYDROCORTISONE ACETATE) 1 pr rectum two times a day for internal hemorrhoids  #12 x 3   Entered and Authorized by:   Jacques Navy MD   Signed by:   Jacques Navy MD on 11/25/2009   Method used:   Electronically to         [facility]* (retail)       [address] [place], [place]  [address]       Ph: [phone]       Fax: [phone]   RxID:   [id number]

## 2010-05-26 NOTE — Progress Notes (Signed)
Summary: ELEVATED BP  Phone Note Call from Patient Call back at [phone] or [phone]   Caller: Patient Call For: Dr Debby Bud Summary of Call: Pt states the last 3 days here blood pressure has been elevated: 158/96 and 155/93. Pt is not currently taking blood pressure medicine.Pt wants to know if there is anything she can do to bring it down. Pt requests a call from triage. If pt needs o.v she wants to see another MD this week. Please advise. Initial call taken by: Verdell Face,  June 24, 2009 3:37 PM  Follow-up for Phone Call        Pt c/o elevated bp and h/a x 4 days. OK work in per [employer], Pt informed, will be at office by 4:30 Follow-up by: Lamar Sprinkles, CMA,  June 24, 2009 4:01 PM

## 2010-05-26 NOTE — Assessment & Plan Note (Signed)
Summary: FU ON SLEEP STUDY/NWS  #   Vital Signs:  Patient profile:   60 year old female Height:      68.5 inches Weight:      247 pounds BMI:     37.14 O2 Sat:      96 % on Room air Temp:     97.9 degrees F oral Pulse rate:   83 / minute BP sitting:   152 / 88  (left arm) Cuff size:   regular  Vitals Entered By: Bill Salinas CMA (August 02, 2009 1:11 PM)  O2 Flow:  Room air CC: pt here for follow up after having sleep study, pt also wants to discuss seeing a nutritonist. Pt unsure of last tetanus and mammogram/ ab   Primary Care Provider:  Illene Regulus, MD  CC:  pt here for follow up after having sleep study and pt also wants to discuss seeing a nutritonist. Pt unsure of last tetanus and mammogram/ ab.  History of Present Illness: Follow-up after consult and testing:  She saw Dr. Vassie Loll for sleep consult: he recommended a split sleep study with a CPAP trial. She reports that she did not have a split study. she also reports that she has tried CPAP before and was intolerant. We discussed OSA and causes. I have recommended that she have the split study and that she allow Dr. Vassie Loll, if indicated, to arrange for home CPAP.  She saw Dr. Everardo All for BP mgt. She had 24 hr urine  for catecholeamines and metanephrines. - normal.  She has been having a recurrence of pain in the right back, similar to the pain she had before with pulmonary infarct from PE. She has trouble with deep breath.   Current Medications (verified): 1)  Aciphex 20 Mg  Tbec (Rabeprazole Sodium) .... Take 1 Tablet By Mouth Two Times A Day 2)  Restoril 30 Mg Caps (Temazepam) .... Take 1 Tab By Mouth At Bedtime 3)  Triamterene-Hctz 37.5-25 Mg Tabs (Triamterene-Hctz) .... 1/2 Tab Once Daily 4)  Aspirin 325 Mg  Tabs (Aspirin) .... Take 1 Tablet By Mouth Once A Day  Allergies (verified): 1)  ! Morphine 2)  ! Sulfa 3)  ! Phenergan  Past History:  Past Medical History: Last updated: 10/23/2008 ESOPHAGEAL  STENOSIS/STRICTURE AFTER FUNDOPLIACTION REDO SUBJECTIVE TINNITUS  MITRAL VALVE PROLAPSE IRRITABLE BOWEL SYNDROME  GERD  COSTOCHONDRITIS OSA-mild  sleep study '04 Pulmonary embolus 04/2008 Chronic Chest pain Chronic dysphagia  Past Surgical History: Last updated: 10/23/2008  BRAIN STEM SURGERY, ARNOLD-CHIARI MALFORMATION, 1995 REDUCTION MAMMOPLASTY APPENDECTOMY, HX OF (ICD-V45.79) HYSTERECTOMY, HX OF (ICD-V45.77) NISSEN FUNDOPLICATION X 3 (lap, open x2, last with mesh)  S/P CHOLECYSTECTOMY '08  Family History: Last updated: 07-13-2009 father- deceased @52 : died in his sleep mohter - deceased @ 11: DM complications, RA 4 sisters with RA Family History of Colon Cancer:Maternal Grandmother  htn: both parents and several sibs  Social History: Last updated: 10/23/2008 [employer] graduate - technical school married '70- 2 years divorced; married '86 - 2.5 years divorced; married '90 - 1 year divorced; married '00 1 son - '72 work: [employer] Alcohol Use - no Patient has never smoked.  Patient does not get regular exercise.   Review of Systems       The patient complains of chest pain.  The patient denies anorexia, fever, weight loss, hoarseness, syncope, dyspnea on exertion, prolonged cough, severe indigestion/heartburn, muscle weakness, suspicious skin lesions, and angioedema.    Physical Exam  General:  Well groomed heavyset AA  female in no distress Head:  Normocephalic and atraumatic without obvious abnormalities. No apparent alopecia or balding. Lungs:  normal respiratory effort, no intercostal retractions, no accessory muscle use, no dullness, no fremitus, and no wheezes.   Heart:  normal rate and regular rhythm.     Impression & Recommendations:  Problem # 1:  SLEEP APNEA (ICD-780.57) Discussed the benefit and encouraged her to 1) reschedule the appropriate sleep study 2) give CPAP a fair trial with properly fitted mask and instructions in use.   Problem # 2:  HYPERTENSION  (ICD-401.9)  Her updated medication list for this problem includes:    Triamterene-hctz 37.5-25 Mg Tabs (Triamterene-hctz) .Marland Kitchen... 1/2 tab once daily  BP today: 152/88 Prior BP: 126/82 (07/02/2009)  Labs Reviewed: K+: 3.5 (06/24/2009) Creat: : 0.8 (06/24/2009)     Better readings previously. Plan is to continue present medications  Problem # 3:  CHEST PAIN, PLEURITIC (ICD-786.52) Recurrent pleuritic chest pain with a normal exam and  O2 sat.  Plan - CXR  Her updated medication list for this problem includes:    Aspirin 325 Mg Tabs (Aspirin) .Marland Kitchen... Take 1 tablet by mouth once a day  Addendum - CXR normal  Complete Medication List: 1)  Aciphex 20 Mg Tbec (Rabeprazole sodium) .... Take 1 tablet by mouth two times a day 2)  Restoril 30 Mg Caps (Temazepam) .... Take 1 tab by mouth at bedtime 3)  Triamterene-hctz 37.5-25 Mg Tabs (Triamterene-hctz) .... 1/2 tab once daily 4)  Aspirin 325 Mg Tabs (Aspirin) .... Take 1 tablet by mouth once a day  Other Orders: T-2 View CXR (71020TC)

## 2010-05-26 NOTE — Letter (Signed)
Summary: CMN for CPAP Supplies/[facility]  CMN for CPAP Supplies/[facility]   Imported By: Sherian Rein 01/10/2010 14:08:22  _____________________________________________________________________  External Attachment:    Type:   Image     Comment:   External Document

## 2010-05-26 NOTE — Progress Notes (Signed)
  Phone Note Call from Patient Call back at Home Phone [phone]   Caller: Patient Summary of Call: Patient called requesting CT results. Initial call taken by: Rock Nephew CMA,  October 11, 2009 4:10 PM  Follow-up for Phone Call        called patinet - CT normal. Still having a lot of pain. Offered 9:30 AM appt for tomorrow tues 6/21 Follow-up by: Jacques Navy MD,  October 11, 2009 6:51 PM  Additional Follow-up for Phone Call Additional follow up Details #1::        appt made and in IDX for 6/21@9 :30am Additional Follow-up by: Verdell Face,  October 12, 2009 8:09 AM    Additional Follow-up for Phone Call Additional follow up Details #2::    pt here for appt Follow-up by: Ami Bullins CMA,  October 12, 2009 9:29 AM

## 2010-05-26 NOTE — Miscellaneous (Signed)
Summary: PT Eval/[facility]  PT Eval/[facility]   Imported By: Sherian Rein 01/04/2010 11:21:08  _____________________________________________________________________  External Attachment:    Type:   Image     Comment:   External Document

## 2010-05-26 NOTE — Progress Notes (Signed)
  Phone Note Outgoing Call   Reason for Call: Discuss lab or test results Summary of Call: please call patient - normal chest x-ray. No indication for  repoeat CT.  Thanks Initial call taken by: Jacques Navy MD,  August 03, 2009 8:40 AM  Follow-up for Phone Call        call patient after verifying DOB infomed pt of results. Follow-up by: Ami Bullins CMA, Brenton Grills  August 03, 2009 10:01 AM

## 2010-05-26 NOTE — Assessment & Plan Note (Signed)
Summary: elevated BP / [place] pt / SD   Vital Signs:  Patient profile:   60 year old female Height:      68.5 inches (173.99 cm) Weight:      257.38 pounds (116.99 kg) O2 Sat:      97 % on Room air Temp:     97.1 degrees F (36.17 degrees C) oral Pulse rate:   90 / minute BP sitting:   150 / 90  (right arm) Cuff size:   large  Vitals Entered By: Sydell Axon (June 24, 2009 4:34 PM)  O2 Flow:  Room air CC: elevated BP/ headache, lightheadedness X4days/ [place] CBG Result 101   Referring Provider:  n/a Primary Provider:  Illene Regulus, MD  CC:  elevated BP/ headache and lightheadedness X4days/ [place].  History of Present Illness: pt states 4 days of moderate (generalized throughout the head) headache, and associated lightheadedness in the context of standing.  Current Medications (verified): 1)  Aciphex 20 Mg  Tbec (Rabeprazole Sodium) .... Take 1 Tablet By Mouth Two Times A Day 2)  Restoril 30 Mg Caps (Temazepam) .... Take 1 Tab By Mouth At Bedtime  Allergies (verified): 1)  ! Morphine 2)  ! Sulfa 3)  ! Phenergan  Past History:  Past Medical History: Last updated: 10/23/2008 ESOPHAGEAL STENOSIS/STRICTURE AFTER FUNDOPLIACTION REDO SUBJECTIVE TINNITUS  MITRAL VALVE PROLAPSE IRRITABLE BOWEL SYNDROME  GERD  COSTOCHONDRITIS OSA-mild  sleep study '04 Pulmonary embolus 04/2008 Chronic Chest pain Chronic dysphagia  Family History: Reviewed history from 10/23/2008 and no changes required. father- deceased @52 : died in his sleep mohter - deceased @ 54: DM complications, RA 4 sisters with RA Family History of Colon Cancer:Maternal Grandmother  htn: both parents and several sibs  Review of Systems  The patient denies syncope.         she also has palpitations and excessive diaphoresis.  Physical Exam  General:  obese.  no distress  Lungs:  Clear to auscultation bilaterally. Normal respiratory effort.  Heart:  Regular rate and rhythm without murmurs or gallops  noted. Normal S1,S2.   Extremities:  trace right pedal edema and trace left pedal edema.   Additional Exam:  Sodium                    142 mEq/L                   135-145   Potassium                 3.5 mEq/L                   3.5-5.1   Chloride                  104 mEq/L                   96-112   Carbon Dioxide       [H]  33 mEq/L                    19-32   Glucose                   89 mg/dL                    16-10   BUN                       9  mg/dL                     1-61   Creatinine                0.8 mg/dL                   0.9-6.0   Calcium                   8.6 mg/dL                   4.5-40.9      White Cell Count          4.9 K/uL                    4.5-10.5    Hemoglobin                12.1 g/dL                   81.1-91.4   Hematocrit                37.6 %                      36.0-46.0    Platelet Count            248.0 K/uL    Impression & Recommendations:  Problem # 1:  HEADACHE (ICD-784.0) episodic  Problem # 2:  HYPERTENSION (ICD-401.9) ? related to #1  Problem # 3:  palpitations ? pheochromocytoma  Medications Added to Medication List This Visit: 1)  Triamterene-hctz 37.5-25 Mg Tabs (Triamterene-hctz) .... 1/2 tab once daily  Other Orders: EKG w/ Interpretation (93000) T-Urine 24 Hr. Catecholamines [phone]) T-Urine 24 Hr. Metanephrines [phone]) TLB-BMP (Basic Metabolic Panel-BMET) (80048-METABOL) TLB-CBC Platelet - w/Differential (85025-CBCD) Est. Patient Level IV (95284)  Patient Instructions: 1)  tests are being ordered for you today.  a few days after the test(s), please call [phone] to hear your test results. 2)  pending the test results, start triamterene-hctz, 37.5/25, 1/2 per day. 3)  arthrotec-75, 1 pill two times a day as needed headache.  here are some samples. Prescriptions: TRIAMTERENE-HCTZ 37.5-25 MG TABS (TRIAMTERENE-HCTZ) 1/2 tab once daily  #30 x 5   Entered and Authorized by:   Minus Breeding MD   Signed by:   Minus Breeding MD on 06/24/2009   Method used:   Electronically to        [facility]* (retail)       [address] [place], [place]  [address]       Ph: [phone]       Fax: [phone]   RxID:   [id number]   Laboratory Results   Blood Tests     CBG Random:: 101mg /dL

## 2010-05-26 NOTE — Letter (Signed)
Summary: Out of Work  [facility]  [address] [place], [place] [address]   Phone: [phone]  Fax: [phone]    11/08/2009  TO: WHOM IT MAY CONCERN  RE: Jennifer [name] [address] [id number]       The above named individual is currently under my care and will be out of work    FROM: 11/08/2009   THROUGH:11/09/09    REASON: Sick/diarrhea    MAY RETURN ON:11/10/09     If you have any further questions or need additional information, please call.     Sincerely,   Stan Head, MD typed by: Darcey Nora RN, CGRN

## 2010-05-26 NOTE — Procedures (Signed)
Summary: EGD:   Patient Name: Jennifer, [name]. MRN:  Procedure Procedures: EsophagoscopyCPT: 43200.    with [facility] Dilation of Esophagus Personnel: Endoscopist: Iva Boop, MD, [facility].  Exam Location: Exam performed in Outpatient Clinic. Outpatient  Patient Consent: Procedure, Alternatives, Risks and Benefits discussed, consent obtained, from patient. Consent was obtained by the RN.  Indications  Therapeutics: Reason for exam: Esophageal dilation.  Symptoms: Dysphagia.  History  Current Medications: Patient is not currently taking Coumadin.  Allergies: Patient is allergic to MORPHINE, SULFA, PHENERGAN.  Comments: 3 PRIOR FUNDOPLICATIONS. HAS PERSISTENT DYSPHAGIA. GETTING MONTHLY MALONEY DIALTIONS PER DR. PAPPAS' RECOMMENDATIONS. THIS IS DILATION #3. SAYS SHE GOT 1-2 WEEKS BENEFIT AFTER LAST DILATION. RECENT LAP CHOLE HELPED ABDOMINAL PAIN Pre-Exam Physical: Performed Oct 23, 2006  Cardio-pulmonary exam, HEENT exam, Abdominal exam, Mental status exam WNL.  Comments: Pt. history reviewed/updated, physical exam performed prior to initiation of sedation? YES Exam Exam Info: Maximum depth of insertion Stomach, intended Stomach. Patient position: on left side. Gastric retroflexion performed. Images taken. ASA Classification: II. Tolerance: good.  Sedation Meds: Patient assessed and found to be appropriate for moderate (conscious) sedation. Fentanyl 75 mcg. given IV. Versed 10 mg. given IV. Cetacaine Spray 2 sprays given aerosolized. Benadryl 50 given IV.  Monitoring: BP and pulse monitoring done. Oximetry used. Supplemental O2 given  Findings - Normal: Proximal Esophagus to Distal Esophagus.  PRIOR SURGERY: Cardia. Anti-Reflux Surgery. Comments: LOOKS NORMAL BUT DISTAL ESOPHAGUS COULD BE STENOTIC. .  - Dilation: Distal Esophagus. Maloney dilator used, Diameter: 56,58, 60 F, 3  total dilators used. Patient tolerance fair, adequate exam. Comments: 56 AND 58 PASSED OK  AND REINSPECTION WITHOUT ABNORMALITY, 60 FR DILATOR PASSED TO 20 CM BUT SHE RETCHED AND I WITHDREW. REINSPECTION SHOWED SMALL MUCOSAL DISRUPTION IN PROXIMAL ESOPHAGUS WITH SMALL AMOUNT OF HEME.( LOWER LEFT PHOTO) MINOR MUCOSAL DISRUPTIONS WITH SMALL HEME IN PROXIMAL STOMACH ALSO.    Comments: EXAM TO PROXIMAL STOMACH ONLY SHE GAGS WITH THE DILATORS  Assessment  Comments: 1) S/P 56, 58 FR ESOPHAGEAL DILATION TODAY 2) 60 FR UNSUCCESSFUL, MINOR MUCOSAL DISRUPTION. I DO NOT THINK THERE IS A SIGNIFICANT TEAR Events  Unplanned Intervention: No unplanned interventions were required.  Plans Comments: CLEAR LIQUIDS ONLY TODAY NOTHING COLD GRADUALLY ADVANCE DIET TOMORROW Disposition: After procedure patient sent to recovery. After recovery patient sent home.  Scheduling: Office Visit, to Iva Boop, MD, Clementeen Graham, 3 WEEKS TO REVIEW   Comments: NEED TO CONSIDER REASSESSMENT WITH BA SWALLOW CONSIDER 18-20 MM BALLOON NEXT TIME IF WE CONTINUE  CC:   Milana Kidney, MD   Glenna Fellows, MD   Illene Regulus, MD  This report was created from the original endoscopy report, which was reviewed and signed by the above listed endoscopist.

## 2010-05-26 NOTE — Miscellaneous (Signed)
Summary: Orders Update   Clinical Lists Changes  Problems: Added new problem of OVERWEIGHT (ICD-278.02) - Signed Orders: Added new Referral order of Nutrition Referral (Nutrition) - Signed

## 2010-05-26 NOTE — Letter (Signed)
Summary: Verification of Disability/[facility]  Verification of Disability/[facility]   Imported By: Sherian Rein 10/15/2009 08:14:00  _____________________________________________________________________  External Attachment:    Type:   Image     Comment:   External Document

## 2010-05-26 NOTE — Progress Notes (Signed)
Summary: f/u  Phone Note Call from Patient Call back at Work Phone [phone]   Caller: Patient Call For: Vassie Loll Reason for Call: Talk to Nurse Summary of Call: pt resch to 06/07.  Need apnea link results today for appt tomorrow.  If no results, pt may need to resch.  That was what her f/u was for. Initial call taken by: Eugene Gavia,  September 27, 2009 11:42 AM  Follow-up for Phone Call        pt wants to know if apnea link results are available for appt tomorrow? If not pt wants to r/s appt. Please advise if you have results. Carron Curie CMA  September 27, 2009 11:59 AM  pl confirm from [facility]  that we have this ?    Follow-up by: Comer Locket Vassie Loll MD,  September 27, 2009 12:02 PM  Additional Follow-up for Phone Call Additional follow up Details #1::        Are the apnea link results for this pt available? Carron Curie CMA  September 27, 2009 12:10 PM  Pt was given the apnea link on Friday with instructions to return to [place] today. Haven't recvd. the apnea link back from pt. Called pt and she stated that her husband was to return it this am. Checked with front and apnea link was not turned. Pt stated that he may have turned in device to primary care. Called and [facility] for primary care to return my call. Alfonso Ramus  September 27, 2009 12:38 PM Harriett Sine with Primary Care returned my call and stated that Mr or Mrs Buskirk didn't stop by there this morning. Called Mrs. Scalisi back and let her know that we nor primary care has the apnea link. She will try and get in touch with her husband to see where it is. Rhonda Cobb  September 27, 2009 2:02 PM Pt's husband brought in apnea link. Downloaded and report is in Dr. Reginia Naas look at. Pt is aware to keep her appt tomorrow. Alfonso Ramus  September 27, 2009 3:06 PM     Additional Follow-up for Phone Call Additional follow up Details #2::    ok Follow-up by: Comer Locket. Vassie Loll MD,  September 27, 2009 4:56 PM

## 2010-05-26 NOTE — Progress Notes (Signed)
Summary: Bouts of diarrhea   Phone Note Call from Patient Call back at Work Phone [phone]   Call For: Dr Leone Payor Reason for Call: Talk to Nurse Summary of Call: Bouts of diarrhea and wonders if she can be seen this week. Initial call taken by: Leanor Kail [facility],  November 02, 2009 4:46 PM  Follow-up for Phone Call        Left message for patient to call back Darcey Nora RN, [facility]  November 03, 2009 9:20 AM  Her IBS has flared up, severe urgency and cramping with each meal and some vomiting.  Patient  will come in and see Willette Cluster RNP today at 3:30 Follow-up by: Darcey Nora RN, CGRN,  November 03, 2009 10:53 AM

## 2010-05-26 NOTE — Assessment & Plan Note (Signed)
Summary: abdominal cramping/IBS/sheri   History of Present Illness Visit Type: Follow-up Visit Primary GI MD: Stan Head MD Primary Provider: Illene Regulus, MD Chief Complaint: chronic diarrhea x 1week, sweating, nausea History of Present Illness:   Patient is a 60 year old female well known to Dr. Leone Payor for chronic dysphagia post three fundoplications. She also has a history of Irritable Bowel Syndrome.  Patient here with five day history of severe, post-prandial abdominal pain, rumbling stomach, nausea and vomiting. Used to have these episodes about ten times a year but now happening more frequently. She sweats perfusely during these episdoes and afterwards feels very weak and has to lay down. No fevers.  Dr. [name] Gubler diagnosed patient with IBS. She used to take a medication under the tongue for relief but doesn't have pills left.     GI Review of Systems    Reports abdominal pain, acid reflux, bloating, and  nausea.     Location of  Abdominal pain: generalized.    Denies belching, chest pain, dysphagia with liquids, dysphagia with solids, heartburn, loss of appetite, vomiting, vomiting blood, weight loss, and  weight gain.      Reports diarrhea.     Denies anal fissure, black tarry stools, change in bowel habit, constipation, diverticulosis, fecal incontinence, heme positive stool, hemorrhoids, irritable bowel syndrome, jaundice, light color stool, liver problems, rectal bleeding, and  rectal pain.   Current Medications (verified): 1)  Aciphex 20 Mg  Tbec (Rabeprazole Sodium) .... Take 1 Tablet By Mouth Two Times A Day 2)  Restoril 30 Mg Caps (Temazepam) .... Take 1 Tab By Mouth At Bedtime 3)  Triamterene-Hctz 37.5-25 Mg Tabs (Triamterene-Hctz) .... 1/2 Tab Once Daily 4)  Aspirin 325 Mg  Tabs (Aspirin) .... Take 1 Tablet By Mouth Once A Day 5)  Alprazolam 0.25 Mg Tabs (Alprazolam) .Marland Kitchen.. 1 Q 6 As Needed Anxiety 6)  Hydrocodone-Acetaminophen 5-325 Mg Tabs (Hydrocodone-Acetaminophen)  .Marland Kitchen.. 1 By Mouth Q 6 As Needed 7)  Lidoderm 5 % Ptch (Lidocaine) .... Apply To Sore Flank Left On 12 Off 12.  Allergies (verified): 1)  ! Morphine 2)  ! Sulfa 3)  ! Phenergan  Past History:  Past Medical History: Reviewed history from 10/23/2008 and no changes required. ESOPHAGEAL STENOSIS/STRICTURE AFTER FUNDOPLIACTION REDO SUBJECTIVE TINNITUS  MITRAL VALVE PROLAPSE IRRITABLE BOWEL SYNDROME  GERD  COSTOCHONDRITIS OSA-mild  sleep study '04 Pulmonary embolus 04/2008 Chronic Chest pain Chronic dysphagia  Past Surgical History: BRAIN STEM SURGERY, ARNOLD-CHIARI MALFORMATION, 1995 REDUCTION MAMMOPLASTY APPENDECTOMY, HX OF (ICD-V45.79) HYSTERECTOMY, HX OF (ICD-V45.77) NISSEN FUNDOPLICATION X 3 (lap, open x2, last with mesh) S/P CHOLECYSTECTOMY '08  Family History: Reviewed history from 06/24/2009 and no changes required. father- deceased @52 : died in his sleep mohter - deceased @ 34: DM complications, RA 4 sisters with RA Family History of Colon Cancer:Maternal Grandmother  htn: both parents and several sibs  Social History: Reviewed history from 10/06/2009 and no changes required. [employer] graduate - technical school married '70- 2 years divorced; married '86 - 2.5 years divorced; married '90 - 1 year divorced; married '00 1 son - '71 Sister - died @ 73 massive MI work: [employer] Alcohol Use - no Patient has never smoked.  Patient does not get regular exercise.   Review of Systems       The patient complains of back pain, muscle pains/cramps, night sweats, and sleeping problems.  The patient denies allergy/sinus, anemia, anxiety-new, arthritis/joint pain, blood in urine, breast changes/lumps, change in vision, confusion, cough, coughing up blood, depression-new,  fainting, fatigue, fever, headaches-new, hearing problems, heart murmur, heart rhythm changes, itching, menstrual pain, nosebleeds, pregnancy symptoms, shortness of breath, skin rash, sore throat, swelling of feet/legs,  swollen lymph glands, thirst - excessive , urination - excessive , urination changes/pain, urine leakage, vision changes, and voice change.    Vital Signs:  Patient profile:   60 year old female Height:      68.5 inches Weight:      250.25 pounds BMI:     37.63 Temp:     98.6 degrees F oral Pulse rate:   72 / minute Pulse rhythm:   regular BP sitting:   122 / 74  (left arm) Cuff size:   regular  Vitals Entered By: June McMurray CMA Duncan Dull) (November 03, 2009 3:44 PM)  Physical Exam  General:  Well developed, well nourished, no acute distress. Head:  Normocephalic and atraumatic. Eyes:  Conjunctiva pink, no icterus.  Mouth:  No oral lesions. Tongue moist.  Neck:  no obvious masses  Heart:  Regular rate and rhythm; no murmurs, rubs,  or bruits. Abdomen:  Abdomen soft, nontender, nondistended. No obvious masses or hepatomegaly.Normal bowel sounds.  Msk:  Symmetrical with no gross deformities. Normal posture. Extremities:  No palmar erythema, no edema.  Neurologic:  Alert and  oriented x4;  grossly normal neurologically. Skin:  Intact without significant lesions or rashes. Cervical Nodes:  No significant cervical adenopathy. Psych:  Alert and cooperative. Normal mood and affect.  Impression & Recommendations:  Problem # 1:  IRRITABLE BOWEL SYNDROME (ICD-564.1) Assessment Deteriorated Patient looks good, abdominal exam is benign. Her symptoms are exactly as described during May 2009 visit with Dr. Leone Payor. This is likely an IBS flare, patient agrees. She would like refill on sublingual Hyoscyamine and we can certainly do that. Patient will call in a few days if symptoms do not resolve.    Patient Instructions: 1)  Levsin #30 x 1 RF take 1 three times a day x 4 days then as needed thereafter. 2)  Follow-up with Dr. Leone Payor as needed.  3)  The medication list was reviewed and reconciled.  All changed / newly prescribed medications were explained.  A complete medication list was  provided to the patient / caregiver.  Prescriptions: LEVSIN/SL 0.125 MG SUBL (HYOSCYAMINE SULFATE) take 1 three times a day for 4 days then take as needed  #30 x 1   Entered by:   Lowry Ram NCMA   Authorized by:   Willette Cluster NP   Signed by:   Lowry Ram NCMA on 11/03/2009   Method used:   Electronically to        [facility]* (retail)       [address] [place], [place]  [address]       Ph: [phone]       Fax: [phone]   RxID:   [id number]

## 2010-05-26 NOTE — Assessment & Plan Note (Signed)
Summary: low grade fever/pain side/cd   Vital Signs:  Patient profile:   60 year old female Height:      68.5 inches Weight:      254 pounds BMI:     38.20 O2 Sat:      96 % on Room air Temp:     97.7 degrees F oral Pulse rate:   83 / minute BP sitting:   128 / 78  (left arm) Cuff size:   large  Vitals Entered By: Bill Salinas CMA (October 06, 2009 9:24 AM)  O2 Flow:  Room air CC: pt here with c/o left side pain with urinary freq/ ab   Primary Care Provider:  Illene Regulus, MD  CC:  pt here with c/o left side pain with urinary freq/ ab.  History of Present Illness: Having severe left flank pain for 5 days which is getting worse. Pain is enough to cause nause. She has had urinary frequency. She has pain with movement, especially stretching or reaching. Denies any injury or strain. Pressure against the area helps. No history of kidney stones.   Current Medications (verified): 1)  Aciphex 20 Mg  Tbec (Rabeprazole Sodium) .... Take 1 Tablet By Mouth Two Times A Day 2)  Restoril 30 Mg Caps (Temazepam) .... Take 1 Tab By Mouth At Bedtime 3)  Triamterene-Hctz 37.5-25 Mg Tabs (Triamterene-Hctz) .... 1/2 Tab Once Daily 4)  Aspirin 325 Mg  Tabs (Aspirin) .... Take 1 Tablet By Mouth Once A Day 5)  Alprazolam 0.25 Mg Tabs (Alprazolam) .Marland Kitchen.. 1 Q 6 As Needed Anxiety  Allergies (verified): 1)  ! Morphine 2)  ! Sulfa 3)  ! Phenergan  Past History:  Past Medical History: Last updated: 10/23/2008 ESOPHAGEAL STENOSIS/STRICTURE AFTER FUNDOPLIACTION REDO SUBJECTIVE TINNITUS  MITRAL VALVE PROLAPSE IRRITABLE BOWEL SYNDROME  GERD  COSTOCHONDRITIS OSA-mild  sleep study '04 Pulmonary embolus 04/2008 Chronic Chest pain Chronic dysphagia  Past Surgical History: Last updated: 10/23/2008  BRAIN STEM SURGERY, ARNOLD-CHIARI MALFORMATION, 1995 REDUCTION MAMMOPLASTY APPENDECTOMY, HX OF (ICD-V45.79) HYSTERECTOMY, HX OF (ICD-V45.77) NISSEN FUNDOPLICATION X 3 (lap, open x2, last with mesh)  S/P  CHOLECYSTECTOMY '08 PSH reviewed for relevance, FH reviewed for relevance  Social History: [employer] graduate - technical school married '70- 2 years divorced; married '86 - 2.5 years divorced; married '90 - 1 year divorced; married '00 1 son - '32 Sister - died @ 20 massive MI work: [employer] Alcohol Use - no Patient has never smoked.  Patient does not get regular exercise.   Review of Systems       The patient complains of fever and dyspnea on exertion.  The patient denies anorexia, weight loss, weight gain, decreased hearing, chest pain, peripheral edema, prolonged cough, abdominal pain, incontinence, muscle weakness, transient blindness, depression, and abnormal bleeding.         99.9 temp  Physical Exam  General:  Overweight AA female in no distress but uncomfortable Head:  normocephalic and atraumatic.   Eyes:  corneas and lenses clear and no injection.   Neck:  supple and full ROM.   Lungs:  normal respiratory effort and normal breath sounds.   Heart:  normal rate, regular rhythm, and no murmur.   Abdomen:  very tender to percussion over the left flank. Abdominal tenderness left quadrant below the costal margin.  Msk:  back- able to stand without assist, flex to 180 degrees. Neurologic:  alert & oriented X3 and cranial nerves II-XII intact.   Skin:  turgor normal, color normal, and no  rashes.   Psych:  Oriented X3 and memory intact for recent and remote.     Impression & Recommendations:  Problem # 1:  FLANK PAIN, LEFT (ICD-789.09)  dip U/A negative but her symptoms are strongly suggestive of renal colic/nephrolithiasis.  Plan - microscopy U/A           Bmet           if microscopic hematuria present - CT Kidney stone protocol, if no blood CT with oral contrast           hydrocodone/APAP  5/325   Her updated medication list for this problem includes:    Aspirin 325 Mg Tabs (Aspirin) .Marland Kitchen... Take 1 tablet by mouth once a day    Hydrocodone-acetaminophen 5-325 Mg Tabs  (Hydrocodone-acetaminophen) .Marland Kitchen... 1 by mouth q 6 as needed  Orders: TLB-Udip w/ Micro (81001-URINE) Radiology Referral (Radiology)  addendum - microscopic exam of urine with no RBCs. Will schedule CT  Complete Medication List: 1)  Aciphex 20 Mg Tbec (Rabeprazole sodium) .... Take 1 tablet by mouth two times a day 2)  Restoril 30 Mg Caps (Temazepam) .... Take 1 tab by mouth at bedtime 3)  Triamterene-hctz 37.5-25 Mg Tabs (Triamterene-hctz) .... 1/2 tab once daily 4)  Aspirin 325 Mg Tabs (Aspirin) .... Take 1 tablet by mouth once a day 5)  Alprazolam 0.25 Mg Tabs (Alprazolam) .Marland Kitchen.. 1 q 6 as needed anxiety 6)  Hydrocodone-acetaminophen 5-325 Mg Tabs (Hydrocodone-acetaminophen) .Marland Kitchen.. 1 by mouth q 6 as needed Prescriptions: HYDROCODONE-ACETAMINOPHEN 5-325 MG TABS (HYDROCODONE-ACETAMINOPHEN) 1 by mouth q 6 as needed  #30 x 1   Entered and Authorized by:   Jacques Navy MD   Signed by:   Jacques Navy MD on 10/06/2009   Method used:   Handwritten   RxID:   [id number]   Laboratory Results   Urine Tests   Date/Time Reported: Ami Bullins CMA  October 06, 2009 9:32 AM   Routine Urinalysis   Color: lt. yellow Appearance: Clear Glucose: negative   (Normal Range: Negative) Bilirubin: negative   (Normal Range: Negative) Ketone: negative   (Normal Range: Negative) Spec. Gravity: 1.015   (Normal Range: 1.003-1.035) Blood: trace-lysed   (Normal Range: Negative) pH: 5.0   (Normal Range: 5.0-8.0) Protein: negative   (Normal Range: Negative) Urobilinogen: 0.2   (Normal Range: 0-1) Nitrite: negative   (Normal Range: Negative) Leukocyte Esterace: negative   (Normal Range: Negative)

## 2010-05-26 NOTE — Progress Notes (Signed)
SummaryPrudy [name]  Phone Note Refill Request   Refills Requested: Medication #1:  Alprazolam pt requesting rx for alprazolam. Please Advise  Initial call taken by: Ami Bullins CMA,  Sep 09, 2009 2:12 PM  Follow-up for Phone Call        OK for alprazolam 0.25 1 by mouth q 6 as needed anxiety, #60, 1 refill. If she needs to take this daily will need ov to discuss long term mgt of anxiety Follow-up by: Jacques Navy MD,  Sep 10, 2009 3:42 PM  Additional Follow-up for Phone Call Additional follow up Details #1::        left mess to call office back............Marland KitchenLamar Sprinkles, CMA  Sep 10, 2009 6:28 PM   Pt informed  Additional Follow-up by: Lamar Sprinkles, CMA,  Sep 13, 2009 10:50 AM    New/Updated Medications: ALPRAZOLAM 0.25 MG TABS (ALPRAZOLAM) 1 q 6 as needed anxiety Prescriptions: ALPRAZOLAM 0.25 MG TABS (ALPRAZOLAM) 1 q 6 as needed anxiety  #60 x 1   Entered by:   Lamar Sprinkles, CMA   Authorized by:   Jacques Navy MD   Signed by:   Lamar Sprinkles, CMA on 09/10/2009   Method used:   Telephoned to ...       [facility]* (retail)       [address] [place], [place]  [address]       Ph: [phone]       Fax: [phone]   RxID:   [id number]

## 2010-05-26 NOTE — Progress Notes (Signed)
Summary: REFILL  Phone Note Refill Request   Refills Requested: Medication #1:  RESTORIL 30 MG CAPS Take 1 tab by mouth at bedtime Initial call taken by: Lamar Sprinkles, CMA,  August 12, 2009 4:43 PM    Prescriptions: RESTORIL 30 MG CAPS (TEMAZEPAM) Take 1 tab by mouth at bedtime  #30 x 5   Entered and Authorized by:   Jacques Navy MD   Signed by:   Jacques Navy MD on 08/12/2009   Method used:   Telephoned to ...       [facility]* (retail)       [address] [place], [place]  [address]       Ph: [phone]       Fax: [phone]   RxID:   [id number]

## 2010-05-26 NOTE — Assessment & Plan Note (Signed)
Summary: rov/jd   Visit Type:  Follow-up Copy to:  n/a Primary Provider/Referring Provider:  Illene Regulus, MD  CC:  Pt here for sleep follow up with Apnea Link sleep study.  History of Present Illness: 60/F, never smoker with positional obstructive sleep apnea. She denies excessive daytime somnolence &  reports Epworth Sleepiness Score as 1/24.Husband has witnessed apneas & gasping episodes in her sleep. She feels tired even after 8-10 hrs of sleep.  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 She stays in bed late on weekends. Sleep latency without restoril is 2-3 h, she has been taking this x 2 yrs. There are 3-4 spontaneous awakenings , no post void latency.  PSG in 10/04 when she weighed 210 lbs showed a TST of 250 mins incl 37 mins of REM, RDI was 15/h with lowest desaturation of 84% & modearte snoring, predominantly REM related events. She now weighs 256lbs.   August 11, 2009 1:40 PM  reviewed PSG >>Mild-to-moderate obstructive sleep apnea with events predominantly  during supine sleep causing sleep fragmentation and oxygen   desaturation. RDI was 17/h, non supine AHI was only 1.7/h  September 28, 2009 9:48 AM  husband states less nsoring with positional device , but she is still tired.  potrbale study (using positional device) shows AHI of 10/h with desaturation index 12/h .  Current Medications (verified): 1)  Aciphex 20 Mg  Tbec (Rabeprazole Sodium) .... Take 1 Tablet By Mouth Two Times A Day 2)  Restoril 30 Mg Caps (Temazepam) .... Take 1 Tab By Mouth At Bedtime 3)  Triamterene-Hctz 37.5-25 Mg Tabs (Triamterene-Hctz) .... 1/2 Tab Once Daily 4)  Aspirin 325 Mg  Tabs (Aspirin) .... Take 1 Tablet By Mouth Once A Day 5)  Alprazolam 0.25 Mg Tabs (Alprazolam) .Marland Kitchen.. 1 Q 6 As Needed Anxiety  Allergies (verified): 1)  ! Morphine 2)  ! Sulfa 3)  ! Phenergan  Past History:  Past Medical History: Last  updated: 10/23/2008 ESOPHAGEAL STENOSIS/STRICTURE AFTER FUNDOPLIACTION REDO SUBJECTIVE TINNITUS  MITRAL VALVE PROLAPSE IRRITABLE BOWEL SYNDROME  GERD  COSTOCHONDRITIS OSA-mild  sleep study '04 Pulmonary embolus 04/2008 Chronic Chest pain Chronic dysphagia  Social History: Last updated: 10/23/2008 [employer] graduate - technical school married '70- 2 years divorced; married '86 - 2.5 years divorced; married '90 - 1 year divorced; married '00 1 son - '72 work: [employer] Alcohol Use - no Patient has never smoked.  Patient does not get regular exercise.   Review of Systems  The patient denies anorexia, fever, weight loss, weight gain, vision loss, decreased hearing, hoarseness, chest pain, syncope, dyspnea on exertion, peripheral edema, prolonged cough, headaches, hemoptysis, abdominal pain, melena, hematochezia, severe indigestion/heartburn, hematuria, muscle weakness, suspicious skin lesions, difficulty walking, depression, unusual weight change, and abnormal bleeding.    Vital Signs:  Patient profile:   60 year old female Height:      68.5 inches Weight:      251 pounds BMI:     37.75 O2 Sat:      98 % on Room air Temp:     98.3 degrees F oral Pulse rate:   84 / minute BP sitting:   114 / 72  (left arm) Cuff size:   large  Vitals Entered By: Zackery Barefoot CMA (September 28, 2009 9:32 AM)  O2 Flow:  Room air CC: Pt here for sleep follow up with Apnea Link sleep study  Comments Medications reviewed with patient Verified contact number and pharmacy with patient Zackery Barefoot CMA  September 28, 2009 9:33 AM    Physical Exam  Additional Exam:  Gen. Pleasant, well-nourished, in no distress, normal affect ENT - no lesions, no post nasal drip, class 2 airway Neck: No JVD, no thyromegaly, no carotid bruits Lungs: no use of accessory muscles, no dullness to percussion, clear without rales or rhonchi  Cardiovascular: Rhythm regular, heart sounds  normal, no murmurs or gallops, no peripheral  edema Musculoskeletal: No deformities, no cyanosis or clubbing      Impression & Recommendations:  Problem # 1:  HYPERSOMNIA, ASSOCIATED WITH SLEEP APNEA (ICD-780.53) Unfortunately, positional device is only partially correcting her symptoms  sleep disordered breathing. Doubt she is a good candidate for oral appliance or surgery. Would prefer re-trial of CPAP  - she prefers to have in lab study rather than autoCPPA trial Compliance encouraged, wt loss emphasized, asked to avoid meds with sedative side effects, cautioned against driving when sleepy.  make nutritionist appt. Orders: Sleep Disorder Referral (Sleep Disorder) Est. Patient Level III ([address])  Patient Instructions: 1)  Copy sent to: dr norins 2)  CPAP titration study 3)  Please schedule a follow-up appointment in 2 months.

## 2010-05-26 NOTE — Progress Notes (Signed)
Summary: Sleep Study?   Phone Note Call from Patient Call back at Home Phone [phone] Call back at Work Phone [phone]   Summary of Call: Pt husband is concerned, says she "stops breathing" at night when she sleeps. Per pt, she had a sleep study many years ago. She was given a "sleep machine" but turned it in b/c it "didn't seem to help". Chart ordered to find out info from last sleep study.  Initial call taken by: Lamar Sprinkles, CMA,  June 21, 2009 4:24 PM  Follow-up for Phone Call        sounds like she needs another sleep study if it has been several years or referral to pulmonary/sleep medicine. Follow-up by: Jacques Navy MD,  June 21, 2009 4:33 PM  Additional Follow-up for Phone Call Additional follow up Details #1::        Not sure when I will get the chart. Will you put in referral for sleep study?  Additional Follow-up by: Lamar Sprinkles, CMA,  June 21, 2009 4:34 PM  New Problems: SLEEP APNEA (ICD-780.57)   Additional Follow-up for Phone Call Additional follow up Details #2::    K Follow-up by: Jacques Navy MD,  June 21, 2009 4:39 PM  New Problems: SLEEP APNEA (ICD-780.57)  Appended Document: Sleep Study?  Pt informed

## 2010-05-26 NOTE — Progress Notes (Signed)
Summary: RESULTS  Phone Note Call from Patient   Caller: Patient Call For: Jennifer [name] Summary of Call: RETURNING NURSE CALL REGUARDING SLEEP STUDY Initial call taken by: Jennifer [name],  August 09, 2009 10:26 AM  Follow-up for Phone Call        Pt sche to see RA on Wed 08/11/2009 @ 1:30pm to discuss results.Jennifer [name] [facility]  August 09, 2009 10:52 AM

## 2010-05-26 NOTE — Assessment & Plan Note (Signed)
Summary: sleep apnea/apc   Visit Type:  Initial Consult Copy to:  n/a Primary Provider/Referring Provider:  Illene Regulus, MD  CC:  Pt here for sleep consult.  History of Present Illness: 60/F, never smoker prompted by husband to seek evaluation for loud snoring. She denies excessive daytime somnolence &  reports Epworth Sleepiness Score as 1/24.Husband has witnessed apneas & gasping episodes in her sleep. She feels tired even after 8-10 hrs of sleep.  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 She stays in bed late on weekends. Sleep latency without restoril is 2-3 h, she has been taking this x 2 yrs. There are 3-4 spontaneous awakenings , no post void latency. She denies headaches or dryness ina m. There is no history suggestive of cataplexy, sleep paralysis or parasomnias PSG in 10/04 when she weighed 210 lbs showed a TST of 250 mins incl 37 mins of REM, RDI was 15/h with lowest desaturation of 84% & modearte snoring, predominantly REM related events. She now weighs 256lbs.    History of Present Illness: My husband says that I snore sometimes and often stop breathing for a second or two at night and gasping for breath. I always feel tired after 8-10 hrs of sleep each night  What time do you typically go to bed?(between what hours): 9pm-5am and 12:30am-12 noon  How long does it take you to fall asleep? I take restoril to sleep, without it 2-3 hours  How many times during the night do you wake up? 3 or 4  What time do you get out of bed to start your day? Daytime hrs 5:30am & Evening hours 12:30pm  Do you drive or operate heavy machinery in your occupation? no  How much has your weight changed (up or down) over the past two years? (in pounds): 50  Have you ever had a sleep study before?  If yes,when and where: Yes/not sure  Do you currently use CPAP ? If so , at what pressure? no  Do you wear oxygen  at any time? If yes, how many liters per minute? no Current Medications (verified): 1)  Aciphex 20 Mg  Tbec (Rabeprazole Sodium) .... Take 1 Tablet By Mouth Two Times A Day 2)  Restoril 30 Mg Caps (Temazepam) .... Take 1 Tab By Mouth At Bedtime 3)  Triamterene-Hctz 37.5-25 Mg Tabs (Triamterene-Hctz) .... 1/2 Tab Once Daily 4)  Aspirin 325 Mg  Tabs (Aspirin) .... Take 1 Tablet By Mouth Once A Day  Allergies (verified): 1)  ! Morphine 2)  ! Sulfa 3)  ! Phenergan  Past History:  Past Medical History: Last updated: 10/23/2008 ESOPHAGEAL STENOSIS/STRICTURE AFTER FUNDOPLIACTION REDO SUBJECTIVE TINNITUS  MITRAL VALVE PROLAPSE IRRITABLE BOWEL SYNDROME  GERD  COSTOCHONDRITIS OSA-mild  sleep study '04 Pulmonary embolus 04/2008 Chronic Chest pain Chronic dysphagia  Past Surgical History: Last updated: 10/23/2008  BRAIN STEM SURGERY, ARNOLD-CHIARI MALFORMATION, 1995 REDUCTION MAMMOPLASTY APPENDECTOMY, HX OF (ICD-V45.79) HYSTERECTOMY, HX OF (ICD-V45.77) NISSEN FUNDOPLICATION X 3 (lap, open x2, last with mesh)  S/P CHOLECYSTECTOMY '08  Family History: Last updated: 07/15/09 father- deceased @60 : died in his sleep mohter - deceased @ 31: DM complications, RA 4 sisters with RA Family History of Colon Cancer:Maternal Grandmother  htn: both parents and several sibs  Social History: Last updated: 10/23/2008 [employer] graduate - technical school married '70- 2 years divorced; married '86 - 2.5 years divorced; married '90 - 1  year divorced; married '00 1 son - '72 work: [employer] Alcohol Use - no Patient has never smoked.  Patient does not get regular exercise.   Review of Systems  The patient denies shortness of breath with activity, shortness of breath at rest, productive cough, non-productive cough, coughing up blood, chest pain, irregular heartbeats, acid heartburn, indigestion, loss of appetite, weight change, abdominal pain, difficulty swallowing, sore throat, tooth/dental problems,  headaches, nasal congestion/difficulty breathing through nose, sneezing, itching, ear ache, anxiety, depression, hand/feet swelling, joint stiffness or pain, rash, change in color of mucus, and fever.    Vital Signs:  Patient profile:   60 year old female Height:      68.5 inches Weight:      255.50 pounds O2 Sat:      95 % on Room air Temp:     97.8 degrees F oral Pulse rate:   104 / minute BP sitting:   126 / 82  (right arm) Cuff size:   large  Vitals Entered By: Zackery Barefoot CMA (July 02, 2009 3:01 PM)  O2 Flow:  Room air CC: Pt here for sleep consult Comments Medications reviewed with patient Verified contact number and pharmacy with patient Zackery Barefoot CMA  July 02, 2009 3:03 PM    Physical Exam  Additional Exam:  Gen. Pleasant, well-nourished, in no distress, normal affect ENT - no lesions, no post nasal drip, class 2 airwya Neck: No JVD, no thyromegaly, no carotid bruits Lungs: no use of accessory muscles, no dullness to percussion, clear without rales or rhonchi  Cardiovascular: Rhythm regular, heart sounds  normal, no murmurs or gallops, no peripheral edema Abdomen: soft and non-tender, no hepatosplenomegaly, BS normal. Musculoskeletal: No deformities, no cyanosis or clubbing Neuro:  alert, non focal     Impression & Recommendations:  Problem # 1:  SLEEP APNEA (ICD-780.57) The pathophysiology of obstructive sleep apnea, it's cardiovascular consequences and modes of treatment including CPAP were discussed with the patient in great detail.  Based on weight gain, excessive daytime somnolence inspite of seemingly adequate sleep time & loud snoring, witnessed apneas, obstructive sleep apnea is very likley & an overnight PSG will be scheduled as a split study. Orders: Sleep Disorder Referral (Sleep Disorder) Consultation Level III ([address])  Medications Added to Medication List This Visit: 1)  Aspirin 325 Mg Tabs (Aspirin) .... Take 1 tablet by mouth once a  day  Patient Instructions: 1)  Copy sent to:Dr Norins 2)  Please schedule a follow-up appointment in 2 weeks after sleep study    Immunization History:  Influenza Immunization History:    Influenza:  historical (01/21/2009)   Appended Document: sleep apnea/apc arrange FU OV please to discuss sleep study  Appended Document: sleep apnea/apc LMOMTCB x 1   Appended Document: sleep apnea/apc Pt is scheduled to see RA on Wed., 08/11/2009 @ 1:30 to discuss sleep study results.

## 2010-05-27 ENCOUNTER — Ambulatory Visit (INDEPENDENT_AMBULATORY_CARE_PROVIDER_SITE_OTHER): Payer: Federal, State, Local not specified - PPO | Admitting: Internal Medicine

## 2010-05-27 ENCOUNTER — Encounter: Payer: Self-pay | Admitting: Internal Medicine

## 2010-05-27 ENCOUNTER — Telehealth: Payer: Self-pay | Admitting: Internal Medicine

## 2010-05-27 DIAGNOSIS — R5381 Other malaise: Secondary | ICD-10-CM

## 2010-05-27 DIAGNOSIS — R079 Chest pain, unspecified: Secondary | ICD-10-CM

## 2010-05-27 DIAGNOSIS — R42 Dizziness and giddiness: Secondary | ICD-10-CM

## 2010-05-27 DIAGNOSIS — R5383 Other fatigue: Secondary | ICD-10-CM

## 2010-06-01 NOTE — Assessment & Plan Note (Signed)
Summary: dizzy/lightheaded/pt spoke w/triage/cd   Vital Signs:  Patient profile:   60 year old female Height:      68.5 inches Weight:      246 pounds BMI:     36.99 O2 Sat:      93 % on Room air Temp:     98.5 degrees F oral Pulse rate:   80 / minute BP sitting:   110 / 70  (left arm) Cuff size:   large  Vitals Entered By: Bill Salinas CMA (May 24, 2010 4:54 PM)  O2 Flow:  Room air CC: pt here for evaluation of dizziness, SOB, chest pain, numbness in arms and hands with hot flashes/ ab   Primary Care Provider:  Illene Regulus, MD  CC:  pt here for evaluation of dizziness, SOB, chest pain, and numbness in arms and hands with hot flashes/ ab.  History of Present Illness: Patient reports thjat for the past month she has been feeling bad with increased fatigue. She also has felt that she was going to pass out. she has had increased sweats that go along with light-headed. She has been on CPAP and did feel better, but her husband reports that she does seem struggle with breathing - deep breaths with sharp exhalations. She does not feel rested when she awakens.  For a month she has been having left arm pain but much worse the last 10 days. She does have paresthesias in the left hand along with pain. She reports that she does have swelling of the hand. She does carry a very heavy bag.  she has had polyuria/nocturia, dry mouth, and she has a family history of diabetes.  Reviewed lab going back to last spring - with normal serum glucose, normal 24 hr urine for metanephrines and catecholamines, nl thyroid function.   For 2 months she has been taking cymbalta for esophageal spasm and pain. Reviewed side effects which does include many of her current symptoms.   Current Medications (verified): 1)  Aciphex 20 Mg  Tbec (Rabeprazole Sodium) .... Take 1 Tablet By Mouth Two Times A Day 2)  Restoril 30 Mg Caps (Temazepam) .... Take 1 Tab By Mouth At Bedtime 3)  Triamterene-Hctz 37.5-25 Mg Tabs  (Triamterene-Hctz) .... 1/2 Tab Once Daily 4)  Aspirin 325 Mg  Tabs (Aspirin) .... Take 1 Tablet By Mouth Once A Day 5)  Alprazolam 0.25 Mg Tabs (Alprazolam) .Marland Kitchen.. 1 Q 6 As Needed Anxiety 6)  Lidoderm 5 % Ptch (Lidocaine) .... Apply To Sore Flank Left On 12 Off 12 As Needed 7)  Levsin/sl 0.125 Mg Subl (Hyoscyamine Sulfate) .... Take 1 Three Times A Day For 4 Days Then Take As Needed 8)  Cymbalta 30 Mg  Cpep (Duloxetine Hcl) .... Take 1 Tab By Mouth At Bedtime  Allergies (verified): 1)  ! Morphine 2)  ! Sulfa 3)  ! Phenergan  Past History:  Past Medical History: Last updated: 10/23/2008 ESOPHAGEAL STENOSIS/STRICTURE AFTER FUNDOPLIACTION REDO SUBJECTIVE TINNITUS  MITRAL VALVE PROLAPSE IRRITABLE BOWEL SYNDROME  GERD  COSTOCHONDRITIS OSA-mild  sleep study '04 Pulmonary embolus 04/2008 Chronic Chest pain Chronic dysphagia  Past Surgical History: Last updated: 11/03/2009 BRAIN STEM SURGERY, ARNOLD-CHIARI MALFORMATION, 1995 REDUCTION MAMMOPLASTY APPENDECTOMY, HX OF (ICD-V45.79) HYSTERECTOMY, HX OF (ICD-V45.77) NISSEN FUNDOPLICATION X 3 (lap, open x2, last with mesh) S/P CHOLECYSTECTOMY '08  Family History: Last updated: 22-Jul-2009 father- deceased @52 : died in his sleep mohter - deceased @ 63: DM complications, RA 4 sisters with RA Family History of Colon Cancer:Maternal Grandmother  htn: both parents and several sibs  Social History: Last updated: 10/06/2009 [employer] graduate - technical school married '70- 2 years divorced; married '86 - 2.5 years divorced; married '90 - 1 year divorced; married '00 1 son - '14 Sister - died @ 39 massive MI work: [employer] Alcohol Use - no Patient has never smoked.  Patient does not get regular exercise.   Review of Systems  The patient denies anorexia, fever, weight loss, weight gain, decreased hearing, hoarseness, syncope, dyspnea on exertion, peripheral edema, headaches, abdominal pain, severe indigestion/heartburn, muscle weakness,  transient blindness, depression, and enlarged lymph nodes.    Physical Exam  General:  large framed heavy set AA woman in no acute distress Head:  Normocephalic and atraumatic without obvious abnormalities. No apparent alopecia or balding. Eyes:  C&S clear Neck:  supple, full ROM, and no thyromegaly.   Chest Wall:  no deformities.   Lungs:  normal respiratory effort, normal breath sounds, no crackles, and no wheezes.   Heart:  normal rate, regular rhythm, and no murmur.   Abdomen:  soft and non-tender.   Msk:  normal ROM, no joint tenderness, no joint swelling, no redness over joints, and no joint deformities.  Positive Phalen's sign left wrist. Very tender to palpation over the lateral epicondyle; tender with forced pronation left hand. Pulses:  2+ radial Neurologic:  alert & oriented X3, cranial nerves II-XII intact, gait normal, and DTRs symmetrical and normal.   Skin:  turgor normal, color normal, no suspicious lesions, and no ulcerations.   Cervical Nodes:  no anterior cervical adenopathy and no posterior cervical adenopathy.   Psych:  Oriented X3, memory intact for recent and remote, normally interactive, and good eye contact.     Impression & Recommendations:  Problem # 1:  ARM PAIN, LEFT (ICD-729.5) On exam there seems to be two problems: 1) carpal tunnel syndrome left 2) lateral epicondylitis.  Plan - cock-up wrist splint, especially at night           aspercreme to left elbow; forearm band support  Problem # 2:  HYPERSOMNIA, ASSOCIATED WITH SLEEP APNEA (ICD-780.53) Sounds like she is having nocturnal respiratory problems.  Plan - she is to advise Dr. Reginia Naas office of her symptoms - defer to Dr. Vassie Loll re any need for titration study.  Problem # 3:  DIZZINESS (ICD-780.4) a Panoply of symptoms with dizziness, near-syncope, hot flashes, weakness, hard to define psych changes - may be medication related.  Plan - stop cymbalta  Problem # 4:  OTHER MALAISE AND FATIGUE  (ICD-780.79) hard to define malaise and fatigue  Plan - CBC, TSH, Bmet in the next day or two.   Complete Medication List: 1)  Aciphex 20 Mg Tbec (Rabeprazole sodium) .... Take 1 tablet by mouth two times a day 2)  Restoril 30 Mg Caps (Temazepam) .... Take 1 tab by mouth at bedtime 3)  Triamterene-hctz 37.5-25 Mg Tabs (Triamterene-hctz) .... 1/2 tab once daily 4)  Aspirin 325 Mg Tabs (Aspirin) .... Take 1 tablet by mouth once a day 5)  Alprazolam 0.25 Mg Tabs (Alprazolam) .Marland Kitchen.. 1 q 6 as needed anxiety 6)  Lidoderm 5 % Ptch (Lidocaine) .... Apply to sore flank left on 12 off 12 as needed 7)  Levsin/sl 0.125 Mg Subl (Hyoscyamine sulfate) .... Take 1 three times a day for 4 days then take as needed 8)  Cymbalta 30 Mg Cpep (Duloxetine hcl) .... Take 1 tab by mouth at bedtime  Patient Instructions: 1)  many of your  symptoms, i.e. sweats, feeling add, etc may be cymbalta. Stop this medication and see if your symptoms improve. 2)  Malaise and fatigue- exam is unremarkable. Will check labs tomorrow: blood count, chemistries, blood sugar, thhyroid function. 3)  Pain in left arm: positive Phalen's sign (bent wrist) suggestive of carpal tunnel syndrom - try using a cock-up wrist brace at night. You also have lateral epicondylitis left elbow - a type of tendonitis. Try using aspercreme over the sore spot on the elbow. You may also benefit from an arm band around the proximal forearm. 4)  Sleep apnea - call Dr. Reginia Naas office and let them know that you husband reports an odd breathing pattern and that you are not feeling rested when you wake up.    Orders Added: 1)  Est. Patient Level IV [40981]

## 2010-06-01 NOTE — Progress Notes (Signed)
Summary: OV?   Phone Note Call from Patient Call back at Work Phone [phone]   Summary of Call: Pt does not feel any better. She feels she needs re-eval in office or to go to hospital. Please advise.  Initial call taken by: Lamar Sprinkles, CMA,  May 27, 2010 11:12 AM  Follow-up for Phone Call        reviewed office note - reveiwed labs which were normal range. She was to stop cymbalta. OV is ok with me...Marland KitchenMarland Kitchenif no way on the schedule can offer saturday clinic. If she needs to go to ED then we should send OV note and lab reports ahead of her.  Follow-up by: Jacques Navy MD,  May 27, 2010 12:56 PM  Additional Follow-up for Phone Call Additional follow up Details #1::        Spoke w/pt -needs wk in apt for 4:30 , ok per MD - pt aware Additional Follow-up by: Lamar Sprinkles, CMA,  May 27, 2010 1:51 PM    Additional Follow-up for Phone Call Additional follow up Details #2::    Scheduled Follow-up by: Lamar Sprinkles, CMA,  May 27, 2010 1:55 PM

## 2010-06-09 NOTE — Assessment & Plan Note (Signed)
Summary: per MD/lmt   Vital Signs:  Patient profile:   60 year old female Height:      68.5 inches Weight:      248 pounds BMI:     37.29 O2 Sat:      98 % on Room air Temp:     97.8 degrees F oral Pulse rate:   69 / minute BP sitting:   120 / 80  (left arm) Cuff size:   large  Vitals Entered By: Bill Salinas CMA (May 27, 2010 4:55 PM)  O2 Flow:  Room air  Primary Care Provider:  Illene Regulus, MD   History of Present Illness: [name] returns for increased substernal chest pain. she was seen several days ago for dizzyness and swets and generally not feeling well. she had multiple labs that were normal: CBCD, Bmet, TSH, FT4, A1C. She was told to stop cymbalta as a possible cause of her symptoms.  Reviewed records from [facility]: they opined that the patients subdsternal pain was due to hypersensitivity of the esophagus post Nissan fundiplication.  The patients increased pain coincides with cessation of cymbalta. The only difference she has noted is that the sweats have stopped.  Current Medications (verified): 1)  Aciphex 20 Mg  Tbec (Rabeprazole Sodium) .... Take 1 Tablet By Mouth Two Times A Day 2)  Restoril 30 Mg Caps (Temazepam) .... Take 1 Tab By Mouth At Bedtime 3)  Triamterene-Hctz 37.5-25 Mg Tabs (Triamterene-Hctz) .... 1/2 Tab Once Daily 4)  Aspirin 325 Mg  Tabs (Aspirin) .... Take 1 Tablet By Mouth Once A Day 5)  Alprazolam 0.25 Mg Tabs (Alprazolam) .Marland Kitchen.. 1 Q 6 As Needed Anxiety 6)  Lidoderm 5 % Ptch (Lidocaine) .... Apply To Sore Flank Left On 12 Off 12 As Needed 7)  Levsin/sl 0.125 Mg Subl (Hyoscyamine Sulfate) .... Take 1 Three Times A Day For 4 Days Then Take As Needed 8)  Cymbalta 30 Mg  Cpep (Duloxetine Hcl) .... Take 1 Tab By Mouth At Bedtime  Allergies (verified): 1)  ! Morphine 2)  ! Sulfa 3)  ! Phenergan PMH-FH-SH reviewed-no changes except otherwise noted  Review of Systems       The patient complains of chest pain.  The patient denies  anorexia, fever, weight loss, weight gain, syncope, dyspnea on exertion, headaches, abdominal pain, severe indigestion/heartburn, depression, abnormal bleeding, and enlarged lymph nodes.    Physical Exam  General:  Well-developed,well-nourished,in no acute distress; alert,appropriate and cooperative throughout examination Head:  normocephalic and atraumatic.   Eyes:  C&S clear Lungs:  normal respiratory effort.   Heart:  normal rate and regular rhythm.   Pulses:  2+ Neurologic:  alert & oriented X3, cranial nerves II-XII intact, and gait normal.   Skin:  turgor normal and color normal.   Psych:  Oriented X3, normally interactive, and good eye contact.     Impression & Recommendations:  Problem # 1:  CHEST PAIN, CHRONIC AFTER FUNDOPLICATIONS (ICD-786.50) After stopping cymbalta the chest pain has become a lot worse.  Plan - resume cymbalta and know that it will cause sweats but will relieve the pain  Problem # 2:  DIZZINESS (ICD-780.4) All labs were normal.  Plan - meclizine 12.5 mg q 6-8 for dizziness.  Her updated medication list for this problem includes:    Meclizine Hcl 12.5 Mg Tabs (Meclizine hcl) .Marland Kitchen... 1 every 6-8 hours for dizziness.  Problem # 3:  OTHER MALAISE AND FATIGUE (ICD-780.79) No metabolic explanation for her  symptoms. No further work-up at this time.   Complete Medication List: 1)  Aciphex 20 Mg Tbec (Rabeprazole sodium) .... Take 1 tablet by mouth two times a day 2)  Restoril 30 Mg Caps (Temazepam) .... Take 1 tab by mouth at bedtime 3)  Triamterene-hctz 37.5-25 Mg Tabs (Triamterene-hctz) .... 1/2 tab once daily 4)  Aspirin 325 Mg Tabs (Aspirin) .... Take 1 tablet by mouth once a day 5)  Alprazolam 0.25 Mg Tabs (Alprazolam) .Marland Kitchen.. 1 q 6 as needed anxiety 6)  Lidoderm 5 % Ptch (Lidocaine) .... Apply to sore flank left on 12 off 12 as needed 7)  Levsin/sl 0.125 Mg Subl (Hyoscyamine sulfate) .... Take 1 three times a day for 4 days then take as needed 8)   Cymbalta 30 Mg Cpep (Duloxetine hcl) .... Take 1 tab by mouth at bedtime 9)  Meclizine Hcl 12.5 Mg Tabs (Meclizine hcl) .Marland Kitchen.. 1 every 6-8 hours for dizziness.  Patient Instructions: 1)  Esophageal pain - diagnosed in [place] with hypersensitivity of the esophagus following your surgery for which the cymbalta was prescribed. Stopping the cymbalta did not resolve you dizziness and feeling bad BUT the pain from the esophagus got worse and is not relieved  by the Aciphex. Plan - resume the cymbalta. 2)  Dizziness and dry mouth: the lab work was all normal. The EKG was normal. Source of the dizziness is not clear. Plan - take meclizine 12.5 mg every 6-8 hours for the dizziness that may be due to an inner ear problem.  Prescriptions: MECLIZINE HCL 12.5 MG TABS (MECLIZINE HCL) 1 every 6-8 hours for dizziness.  #100 x 1   Entered and Authorized by:   Jacques Navy MD   Signed by:   Jacques Navy MD on 05/29/2010   Method used:   Electronically to        [facility]* (retail)       [address] [place], [place]  [address]       Ph: [phone]       Fax: [phone]   RxID:   [id number]    Orders Added: 1)  Est. Patient Level III [84132]

## 2010-06-19 ENCOUNTER — Encounter: Payer: Self-pay | Admitting: Pulmonary Disease

## 2010-06-22 ENCOUNTER — Ambulatory Visit (INDEPENDENT_AMBULATORY_CARE_PROVIDER_SITE_OTHER): Payer: Federal, State, Local not specified - PPO | Admitting: Pulmonary Disease

## 2010-06-22 ENCOUNTER — Encounter: Payer: Self-pay | Admitting: Pulmonary Disease

## 2010-06-22 DIAGNOSIS — K219 Gastro-esophageal reflux disease without esophagitis: Secondary | ICD-10-CM

## 2010-06-27 ENCOUNTER — Telehealth: Payer: Self-pay | Admitting: Internal Medicine

## 2010-06-30 NOTE — Assessment & Plan Note (Signed)
Summary: OV COUGH//SH IN    Visit Type:  Follow-up Copy to:  na Primary Provider/Referring Provider:  Illene Regulus, MD  CC:  Pt c/o productive cough with clear to yellow mucus.  Pt requesting a "pillow" for CPAP machine and new mask. c/o being tired a lot.  History of Present Illness: 60/F, never smoker with positional obstructive sleep apnea. PSG in 10/04 when she weighed 210 lbs showed a TST of 250 mins incl 37 mins of REM, RDI was 15/h with lowest desaturation of 84% & modearte snoring, predominantly REM related events.   PSG april'11 (256 lbs )>>Mild-to-moderate obstructive sleep apnea - RDI was 17/h, non supine AHI was only 1.7/h  September 28, 2009- husband states less nsoring with positional device , but she is still tired.  potrbale study (using positional device) shows AHI of 10/h with desaturation index 12/h .  January 25, 2010  psg (250 lbs)  - cpap titrated to 9 cm, smal full face mask agreeable to using CPAP, felt refreshed the mornign after. Able to use CPAP, download 8/23- 01/11/10 >> no residual events, good compliance, minimal leak  June 22, 2010 4:17 PM  download on 9 cm, AHI 4.5/h, good usage,leak +, would like cpap pillow , c/o crick in her neck Remains fatigued c/o cough x 1 month, minimal white mucus, denies GERD - has Nisen's x 3 - [place]/ [place] Has some dysphagia   Preventive Screening-Counseling & Management  Alcohol-Tobacco     Smoking Status: never  Current Medications (verified): 1)  Aciphex 20 Mg  Tbec (Rabeprazole Sodium) .... Take 1 Tablet By Mouth Two Times A Day 2)  Restoril 30 Mg Caps (Temazepam) .... Take 1 Tab By Mouth At Bedtime 3)  Triamterene-Hctz 37.5-25 Mg Tabs (Triamterene-Hctz) .... 1/2 Tab Once Daily 4)  Aspirin 325 Mg  Tabs (Aspirin) .... Take 1 Tablet By Mouth Once A Day 5)  Alprazolam 0.25 Mg Tabs (Alprazolam) .Marland Kitchen.. 1 Q 6 As Needed Anxiety 6)  Lidoderm 5 % Ptch (Lidocaine) .... Apply To Sore Flank Left On 12 Off 12  As Needed 7)  Levsin/sl 0.125 Mg Subl (Hyoscyamine Sulfate) .... Take 1 Three Times A Day For 4 Days Then Take As Needed 8)  Cymbalta 30 Mg  Cpep (Duloxetine Hcl) .... Take 1 Tab By Mouth At Bedtime 9)  Meclizine Hcl 12.5 Mg Tabs (Meclizine Hcl) .Marland Kitchen.. 1 Every 6-8 Hours For Dizziness.  Allergies (verified): 1)  ! Morphine 2)  ! Sulfa 3)  ! 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  Patient does not get regular exercise.   Review of Systems       The patient complains of prolonged cough.  The patient denies anorexia, fever, weight loss, weight gain, vision loss, decreased hearing, hoarseness, chest pain, syncope, dyspnea on exertion, peripheral edema, headaches, hemoptysis, abdominal pain, melena, hematochezia, severe indigestion/heartburn, hematuria, muscle weakness, suspicious skin lesions, transient blindness, difficulty walking, depression, unusual weight change, abnormal bleeding, and enlarged lymph nodes.  Vital Signs:  Patient profile:   60 year old female Height:      68.5 inches Weight:      257.6 pounds BMI:     38.74 O2 Sat:      96 % on Room air Temp:     98.0 degrees F oral Pulse rate:   71 / minute BP sitting:   132 / 68  (right arm) Cuff size:   large  Vitals Entered By: Zackery Barefoot CMA (June 22, 2010 3:54 PM)  O2 Flow:  Room air CC: Pt c/o productive cough with clear to yellow mucus.  Pt requesting a  "pillow" for CPAP machine and new mask. c/o being tired a lot Comments Medications reviewed with patient Verified contact number and pharmacy with patient Zackery Barefoot [facility]  June 22, 2010 3:55 PM    Physical Exam  Additional Exam:  wt 249 January 25, 2010 >> 257 June 22, 2010  Gen. Pleasant, well-nourished, in no distress, normal affect ENT - no lesions, no post nasal drip, class 2 airway Neck: No JVD, no thyromegaly, no carotid bruits Lungs: no use of accessory muscles, no dullness to percussion, clear without rales or rhonchi  Cardiovascular: Rhythm regular, heart sounds  normal, no murmurs or gallops, no peripheral edema Musculoskeletal: No deformities, no cyanosis or clubbing      Impression & Recommendations:  Problem # 1:  HYPERSOMNIA, ASSOCIATED WITH SLEEP APNEA (ICD-780.53) Compliance encouraged, wt loss emphasized, asked to avoid meds with sedative side effects, cautioned against driving when sleepy. Go over to sleep lab for trial of nasal pillows OK for cpap pillow some per sistent fatigue related to shift work vs med induced Orders: DME Referral (DME)  Problem # 2:  GERD (ICD-530.81)  wonder if her cough is related to GERD & esophageal issues symptomatic relief  Her updated medication list for this problem includes:    Aciphex 20 Mg Tbec (Rabeprazole sodium) .Marland Kitchen... Take 1 tablet by mouth two times a day    Levsin/sl 0.125 Mg Subl (Hyoscyamine sulfate) .Marland Kitchen... Take 1 three times a day for 4 days then take as needed  Orders: Est. Patient Level IV ([address])  Patient Instructions: 1)  Copy sent to: Dr Debby Bud 2)  Please schedule a follow-up appointment in 6 months. 3)  CPAP pillow 4)  Go over to the sleep lab for fitting with  nasal prongs - SWIFT 5)  Exercise program x 30 mins  6)  Cough may be realted to esophageal problems - call if worse 7)  Take DELSYM as needed

## 2010-07-01 ENCOUNTER — Encounter: Payer: Self-pay | Admitting: Pulmonary Disease

## 2010-07-05 NOTE — Progress Notes (Signed)
  Phone Note Refill Request Message from:  Fax from Pharmacy on June 27, 2010 1:52 PM  Refills Requested: Medication #1:  RESTORIL 30 MG CAPS Take 1 tab by mouth at bedtime Please Advise refills  Initial call taken by: Ami Bullins CMA,  June 27, 2010 1:52 PM  Follow-up for Phone Call        ok for refill x 5 Follow-up by: Jacques Navy MD,  June 27, 2010 6:14 PM    Prescriptions: RESTORIL 30 MG CAPS (TEMAZEPAM) Take 1 tab by mouth at bedtime  #30 x 5   Entered by:   Ami Bullins CMA   Authorized by:   Jacques Navy MD   Signed by:   Bill Salinas CMA on 06/28/2010   Method used:   Telephoned to ...       [facility]* (retail)       [address] [place], [place]  [address]       Ph: [phone]       Fax: [phone]   RxID:   [id number]

## 2010-07-05 NOTE — Procedures (Signed)
Summary: Summary Graphs Holter and Event  Summary Graphs Holter and Event   Imported By: Kassie Mends 07/01/2010 09:15:51  _____________________________________________________________________  External Attachment:    Type:   Image     Comment:   External Document

## 2010-07-12 NOTE — Medication Information (Signed)
Summary: Mask Fitting/[place]  Mask Fitting/   Imported By: Sherian Rein 07/08/2010 07:56:25  _____________________________________________________________________  External Attachment:    Type:   Image     Comment:   External Document

## 2010-07-28 LAB — URINALYSIS, ROUTINE W REFLEX MICROSCOPIC
Glucose, UA: NEGATIVE mg/dL
Hgb urine dipstick: NEGATIVE
Ketones, ur: NEGATIVE mg/dL
Protein, ur: NEGATIVE mg/dL
Urobilinogen, UA: 0.2 mg/dL (ref 0.0–1.0)

## 2010-07-28 LAB — POCT CARDIAC MARKERS: Troponin i, poc: <0.05 ng/mL (ref 0.00–0.09)

## 2010-07-28 LAB — CBC
HCT: 39.6 % (ref 36.0–46.0)
MCHC: 33.1 g/dL (ref 30.0–36.0)
MCV: 87.6 fL (ref 78.0–100.0)
Platelets: 235 10*3/uL (ref 150–400)
RBC: 4.52 MIL/uL (ref 3.87–5.11)

## 2010-07-28 LAB — BASIC METABOLIC PANEL
BUN: 12 mg/dL (ref 6–23)
CO2: 28 mEq/L (ref 19–32)
Chloride: 105 mEq/L (ref 96–112)
Creatinine, Ser: 0.86 mg/dL (ref 0.4–1.2)

## 2010-07-28 LAB — URINE MICROSCOPIC-ADD ON

## 2010-07-28 LAB — D-DIMER, QUANTITATIVE: D-Dimer, Quant: 0.62 ug/mL-FEU — ABNORMAL HIGH (ref 0.00–0.48)

## 2010-08-01 ENCOUNTER — Other Ambulatory Visit: Payer: Self-pay | Admitting: Internal Medicine

## 2010-08-01 ENCOUNTER — Other Ambulatory Visit: Payer: Self-pay | Admitting: Endocrinology

## 2010-08-03 LAB — PREGNANCY, URINE: Preg Test, Ur: NEGATIVE

## 2010-08-03 LAB — URINALYSIS, ROUTINE W REFLEX MICROSCOPIC
Bilirubin Urine: NEGATIVE
Nitrite: NEGATIVE
Specific Gravity, Urine: 1.027 (ref 1.005–1.030)
Urobilinogen, UA: 0.2 mg/dL (ref 0.0–1.0)
pH: 6.5 (ref 5.0–8.0)

## 2010-08-03 LAB — CBC
Hemoglobin: 12.6 g/dL (ref 12.0–15.0)
MCHC: 33.8 g/dL (ref 30.0–36.0)
RBC: 4.37 MIL/uL (ref 3.87–5.11)
WBC: 3.9 10*3/uL — ABNORMAL LOW (ref 4.0–10.5)

## 2010-08-03 LAB — BASIC METABOLIC PANEL
CO2: 27 mEq/L (ref 19–32)
Calcium: 8.4 mg/dL (ref 8.4–10.5)
Creatinine, Ser: 0.8 mg/dL (ref 0.4–1.2)
GFR calc Af Amer: >60 mL/min (ref >60)
GFR calc non Af Amer: >60 mL/min (ref >60)
Sodium: 141 mEq/L (ref 135–145)

## 2010-08-03 LAB — DIFFERENTIAL
Basophils Relative: 1 % (ref 0–1)
Lymphocytes Relative: 40 % (ref 12–46)
Monocytes Absolute: 0.5 10*3/uL (ref 0.1–1.0)
Monocytes Relative: 14 % — ABNORMAL HIGH (ref 3–12)
Neutro Abs: 1.6 10*3/uL — ABNORMAL LOW (ref 1.7–7.7)
Neutrophils Relative %: 40 % — ABNORMAL LOW (ref 43–77)

## 2010-08-03 LAB — PROTIME-INR
INR: 2.9 — ABNORMAL HIGH (ref 0.00–1.49)
INR: 2.5 — ABNORMAL HIGH (ref 0.00–1.49)
Prothrombin Time: 32.7 seconds — ABNORMAL HIGH (ref 11.6–15.2)

## 2010-08-08 LAB — CK TOTAL AND CKMB (NOT AT ARMC)
CK, MB: 0.6 ng/mL (ref 0.3–4.0)
CK, MB: 0.8 ng/mL (ref 0.3–4.0)
Total CK: 50 U/L (ref 7–177)
Total CK: 57 U/L (ref 7–177)

## 2010-08-08 LAB — CBC
HCT: 34.6 % — ABNORMAL LOW (ref 36.0–46.0)
HCT: 37.9 % (ref 36.0–46.0)
Hemoglobin: 10.8 g/dL — ABNORMAL LOW (ref 12.0–15.0)
Hemoglobin: 11.5 g/dL — ABNORMAL LOW (ref 12.0–15.0)
MCHC: 33.4 g/dL (ref 30.0–36.0)
MCHC: 33.6 g/dL (ref 30.0–36.0)
Platelets: 211 10*3/uL (ref 150–400)
Platelets: 258 10*3/uL (ref 150–400)
Platelets: 282 10*3/uL (ref 150–400)
Platelets: 325 10*3/uL (ref 150–400)
RBC: 3.99 MIL/uL (ref 3.87–5.11)
RDW: 14.7 % (ref 11.5–15.5)
RDW: 14.9 % (ref 11.5–15.5)
RDW: 15 % (ref 11.5–15.5)
RDW: 15.7 % — ABNORMAL HIGH (ref 11.5–15.5)
WBC: 4.9 10*3/uL (ref 4.0–10.5)

## 2010-08-08 LAB — BRAIN NATRIURETIC PEPTIDE
Pro B Natriuretic peptide (BNP): <30 pg/mL (ref 0.0–100.0)
Pro B Natriuretic peptide (BNP): <30 pg/mL (ref 0.0–100.0)

## 2010-08-08 LAB — POCT CARDIAC MARKERS
CKMB, poc: <1 ng/mL — ABNORMAL LOW (ref 1.0–8.0)
Myoglobin, poc: 64.5 ng/mL (ref 12–200)
Myoglobin, poc: 70.7 ng/mL (ref 12–200)

## 2010-08-08 LAB — URINE CULTURE: Special Requests: NEGATIVE

## 2010-08-08 LAB — URINALYSIS, ROUTINE W REFLEX MICROSCOPIC
Bilirubin Urine: NEGATIVE
Glucose, UA: NEGATIVE mg/dL
Hgb urine dipstick: NEGATIVE
Hgb urine dipstick: NEGATIVE
Ketones, ur: NEGATIVE mg/dL
Protein, ur: 30 mg/dL — AB
Protein, ur: NEGATIVE mg/dL
Urobilinogen, UA: 0.2 mg/dL (ref 0.0–1.0)
Urobilinogen, UA: 0.2 mg/dL (ref 0.0–1.0)

## 2010-08-08 LAB — DIFFERENTIAL
Basophils Relative: 0 % (ref 0–1)
Basophils Relative: 1 % (ref 0–1)
Eosinophils Absolute: 0.1 10*3/uL (ref 0.0–0.7)
Eosinophils Relative: 1 % (ref 0–5)
Lymphocytes Relative: 26 % (ref 12–46)
Lymphs Abs: 1.3 10*3/uL (ref 0.7–4.0)
Monocytes Absolute: 0.4 10*3/uL (ref 0.1–1.0)
Monocytes Absolute: 0.4 10*3/uL (ref 0.1–1.0)
Monocytes Relative: 5 % (ref 3–12)
Monocytes Relative: 8 % (ref 3–12)
Neutro Abs: 3.3 10*3/uL (ref 1.7–7.7)
Neutro Abs: 5.3 10*3/uL (ref 1.7–7.7)
Neutrophils Relative %: 64 % (ref 43–77)

## 2010-08-08 LAB — COMPREHENSIVE METABOLIC PANEL
ALT: 17 U/L (ref 0–35)
ALT: 18 U/L (ref 0–35)
AST: 17 U/L (ref 0–37)
Albumin: 3 g/dL — ABNORMAL LOW (ref 3.5–5.2)
Albumin: 3.3 g/dL — ABNORMAL LOW (ref 3.5–5.2)
Albumin: 3.4 g/dL — ABNORMAL LOW (ref 3.5–5.2)
Alkaline Phosphatase: 55 U/L (ref 39–117)
Alkaline Phosphatase: 60 U/L (ref 39–117)
Alkaline Phosphatase: 74 U/L (ref 39–117)
BUN: 11 mg/dL (ref 6–23)
Calcium: 8.7 mg/dL (ref 8.4–10.5)
Chloride: 105 mEq/L (ref 96–112)
GFR calc Af Amer: >60 mL/min (ref >60)
Potassium: 3.8 mEq/L (ref 3.5–5.1)
Potassium: 3.8 mEq/L (ref 3.5–5.1)
Potassium: 4 mEq/L (ref 3.5–5.1)
Sodium: 139 mEq/L (ref 135–145)
Sodium: 141 mEq/L (ref 135–145)
Total Protein: 6.3 g/dL (ref 6.0–8.3)
Total Protein: 6.7 g/dL (ref 6.0–8.3)
Total Protein: 7.6 g/dL (ref 6.0–8.3)

## 2010-08-08 LAB — PROTIME-INR
INR: 1.1 (ref 0.00–1.49)
INR: 1.1 (ref 0.00–1.49)
INR: 1.2 (ref 0.00–1.49)
INR: 1.8 — ABNORMAL HIGH (ref 0.00–1.49)
INR: 1.9 — ABNORMAL HIGH (ref 0.00–1.49)
INR: 2.1 — ABNORMAL HIGH (ref 0.00–1.49)
Prothrombin Time: 14.5 seconds (ref 11.6–15.2)
Prothrombin Time: 15 seconds (ref 11.6–15.2)
Prothrombin Time: 15.8 seconds — ABNORMAL HIGH (ref 11.6–15.2)
Prothrombin Time: 17.9 seconds — ABNORMAL HIGH (ref 11.6–15.2)
Prothrombin Time: 21.8 seconds — ABNORMAL HIGH (ref 11.6–15.2)
Prothrombin Time: 22.5 seconds — ABNORMAL HIGH (ref 11.6–15.2)

## 2010-08-08 LAB — POCT I-STAT, CHEM 8
BUN: 13 mg/dL (ref 6–23)
Calcium, Ion: 1.1 mmol/L — ABNORMAL LOW (ref 1.12–1.32)
Chloride: 103 mEq/L (ref 96–112)
HCT: 40 % (ref 36.0–46.0)
Sodium: 141 mEq/L (ref 135–145)
TCO2: 28 mmol/L (ref 0–100)

## 2010-08-08 LAB — APTT
aPTT: 41 seconds — ABNORMAL HIGH (ref 24–37)
aPTT: 61 seconds — ABNORMAL HIGH (ref 24–37)

## 2010-08-08 LAB — TROPONIN I
Troponin I: <0.01 ng/mL (ref 0.00–0.06)
Troponin I: <0.01 ng/mL (ref 0.00–0.06)

## 2010-08-08 LAB — D-DIMER, QUANTITATIVE: D-Dimer, Quant: 1.3 ug/mL-FEU — ABNORMAL HIGH (ref 0.00–0.48)

## 2010-08-08 LAB — BILIRUBIN, DIRECT: Bilirubin, Direct: <0.1 mg/dL (ref 0.0–0.3)

## 2010-08-08 LAB — URINE MICROSCOPIC-ADD ON

## 2010-09-06 NOTE — Consult Note (Signed)
Jennifer [name], Jennifer [name]                  ACCOUNT NO.:  [id number]   MEDICAL RECORD NO.:  [id number]          PATIENT TYPE:  EMS   LOCATION:  ED                           FACILITY:  [facility]   PHYSICIAN:  Valerie A. Felicity Coyer, MDDATE OF BIRTH:  11-18-1950   DATE OF CONSULTATION:  05/07/2008  DATE OF DISCHARGE:                                 CONSULTATION   PRIMARY CARE PHYSICIAN:  Rosalyn Gess. Norins, MD.   CONSULTING PHYSICIAN:  Samuel Jester, DO.   CHIEF COMPLAINT:  Shortness of breath with chest pain last night like  my clot.   REASON FOR CONSULTATION:  Evaluation of chest pain with subtherapeutic  INR.   HISTORY OF PRESENT ILLNESS:  The patient is a 60 year old white female  discharged from the hospital less than 48 hours ago following a 6-day  hospitalization for the treatment of a new diagnosis PE with right lower  lobe infarct following immobilization with a knee injury on December 22.  Her INR was 2.1 on day of discharge and she was treated with a  prescription to continue Coumadin 5 mg daily with outpatient followup at  Anticoagulation Clinic, which was scheduled for today at 10:00 a.m. with  [facility].  Pain in the hospital due to pulmonary infarct had  been controlled with oxycodone on a scheduled basis, but she was not  given a narcotic prescription at the time of discharge, hopeful  anticipation of pain control given time away from diagnosis.  However,  the patient awoke from her sleep this morning due to recurrent nature of  her pleuritic sharp chest pain in the right lower side which she  describes same as where my clot hurt before.  This pain was associated  with shortness of breath so she came to the emergency room for further  evaluation.  There her INR was found to be 1.8 and I have been asked to  further evaluate and possibly admit by the emergency room physician  today.  At this time the patient denies any chest pain or shortness of  breath after she has  been given 50 mg of IV Fentanyl as well as a 20 mg  OxyContin tablet.  She has been taking her Coumadin as prescribed at the  time of discharge.  She denies cough.  She denies fever or chills.  She  has had no sputum.  No nausea, vomiting.  She has multiple questions  regarding why the Coumadin level was not dropped since discharge and is  anxious and scared about the nature of the blood clot.   PAST MEDICAL HISTORY:  1. Significant for PE right lower lobe with infarct diagnosed April 30, 2008.  2. history of mitral valve prolapse.  3. History of thyroid nodule, scheduled for outpatient workup.  4. GERD.   MEDICATIONS:  1. Include Coumadin 5 mg daily since January 12.  2,  Aciphex 20 mg b.i.d.  1. Restoril 15 mg h.s.  2. OxyIR 5 mg b.i.d. which the patient denies having.   ALLERGIES:  MORPHINE, DILAUDID, SULFA AND PHENERGAN.  FAMILY HISTORY:  Has been reviewed and noncontributory.   SOCIAL HISTORY:  She lives with her spouse.  She does not smoke.  She  does not drink.  She works as a Electrical engineer.   REVIEW OF SYSTEMS:  Please see HPI for pertinent positives.  She has had  no medication changes.  No bleeding no bruising.  No fever or chills.  No cough or sputum.  No nausea or vomiting.   PHYSICAL EXAM:  Temperature 98.1, blood pressure 139/88, pulse of 80,  respirations 22, satting 100% to 97% on room air.  IN GENERAL:  She is a heavy-set white female who is in no acute distress  in the emergency room both semi-retirement as well as upright.  Her neck  is thick but supple.  Full range of motion.  No appreciable JVD, masses  or nodules.  EYES:  Are PERRL.  EOMI.  No icterus or scleral injection.  ENT:  Shows grossly normal hearing.  Oropharynx clear without petechia,  thrush or other lesions.  RESPIRATORY:  Shows clear to auscultation bilaterally with good air  movement throughout each lung.  No increased work of breathing at rest.  No wheeze, crackle or rhonchi.   CARDIOVASCULAR:  Is regular rate and rhythm.  No murmurs or rubs.  Trace  lower extremity edema appreciated bilaterally but no significant  swelling.  Good, strong pulses in all extremities.  ABDOMEN:  Is obese but soft, nontender, nondistended with good bowel  sounds.  No hepatosplenomegaly.  No masses palpable.  PSYCH:  She is awake, alert, oriented x4 with good insight.  Anxious  mood but appropriate affect.   LABORATORY DATA:  Show a normal CBC with white count of 5.2, hemoglobin  12.6, platelet of 325.  Complete metabolic panel also within normal  limits including normal LFTs.  BNP is less than 30.  Point of care  cardiac enzymes negative x1.  Lipase of 19.  Urinalysis negative.  INR  of 1.8.  Portable chest x-ray reviewed shows a tiny right pleural  effusion slightly increased from previous exam and unchanged airspace  disease consistent with prior diagnosis of pulmonary infarct.   ASSESSMENT/PLAN:  Pleuritic chest pain secondary to pulmonary infarct.  It appears the patient's pain symptoms are from known pulmonary embolism  with pulmonary infarct in this area with lack of adequate narcotic pain  control following discharge from hospitalization.  Her oxygen  saturations are stable as are her hemodynamics and following the  administration of narcotic pain medication, the patient has neither  chest pain nor shortness of breath.  INR is slightly subtherapeutic at  1.8 status and following the diagnosis and initial treatment of  pulmonary embolism, rescanning of her chest at this time would offer no  further information that would change the course of treatment.  We have  given her a Lovenox bridging dose of 150 mg which will last for a 24-  hour therapeutic window with anticoagulation and instructed the patient  to increase her Coumadin to 10 mg beginning tonight.  It has been  explained to her that the purpose of anticoagulation followup which was  for today at 10:00 a.m. was for  the purpose of continued titration on an  ongoing basis for her Coumadin as this is the nature of Coumadin and not  uncommon to need further dose adjustments.  Likewise as she is  subtherapeutic should there be need for further Lovenox bridging and the  patient is unwilling to do this for  herself at home, this can be  arranged by Coumadin Clinic and/or through home health assistance as  needed depending on followup laboratories and management from this point  forward.  Her anticoagulation appointment has been rescheduled at  [facility] for tomorrow, Friday January 15 at  2:00 p.m.  Her case management including emergency room reevaluation and  change in plans has been discussed with managing clinical pharmacist Dr.  Shelby Dubin as well as with the primary care physician, Dr. Illene Regulus,  each of who concur and agree with plans as described here.  This has been extensively reviewed with the patient who understands  continued ongoing management of her pulmonary embolism and pulmonary  infarct as ongoing with better attention to narcotic pain control  management of her infarct until the time and duration of which this is  mo longer symptomatic.  Again, she is hemodynamically stable without O2  compromise or other laboratory abnormality that would require further  inpatient evaluation or change in treatment except as described here.  The patient is understanding of this plan and agrees to follow up  tomorrow with Coumadin Clinic as described with pain control as listed  here.  She will take OxyContin 20 mg b.i.d. for an extended pain control  as this was effective for her in the hospital. I have also renewed a  prescription for OxyIR 5 mg to be taken every 4 hours p.r.n.  breakthrough pain, but explained again there should be no need for this  anticipating her response to OxyContin in the hospital at this dose.  Though patient is slightly anxious, she is glad to  not be returned to  the hospital.  She has discussed this via phone also with her primary  care physician for reassurance and is stable and agreeable for discharge  home from the emergency room today.  Other medical issues are as  previously listed.  Please see recent discharge summary for other  details.      Valerie A. Felicity Coyer, MD  Electronically Signed     VAL/MEDQ  D:  05/07/2008  T:  05/07/2008  Job:  [id number]

## 2010-09-06 NOTE — Assessment & Plan Note (Signed)
OFFICE VISIT   CARRYE, GOLLER  DOB:  05-05-1950                                       02/08/2009  [id number]#:[id number]   The patient returns today for further follow-up regarding her painful  venous insufficiency involving the right lower extremity.  She has  continued to have aching, throbbing and burning discomfort in the thigh  and medial and lateral knee areas secondary to her venous insufficiency.  She has quite a bit of hypersensitivity is well.  She has been wearing  long-leg elastic compression stockings (20 mm-30-mm gradient) and  elevating her legs as much as she can as well as taking ibuprofen on a  regular basis, but this has not relieved the symptoms.  She had a lower  extremity venous duplex study at her last visit which revealed reflux in  the right great saphenous vein, which communicates with these painful  varicosities in the thigh and lateral calf.  the left great saphenous  vein has no reflux.   I do not think she has gotten adequate relief of her symptoms from these  conservative measures, and I would recommend proceeding with laser  ablation of her right great saphenous vein with 2 courses of  sclerotherapy to follow to treat this painful venous insufficiency,  which is affecting her daily living both at work and at home.  We will  proceed with precertification for this.  I visualized her great  saphenous vein today myself with the Sono[employer] and agree with this plan.   Quita Skye Hart [place], M.D.  Electronically Signed   JDL/MEDQ  D:  02/08/2009  T:  02/09/2009  Job:  [id number]   cc:   Rosalyn Gess. Norins, MD

## 2010-09-06 NOTE — H&P (Signed)
[name], [name]                  ACCOUNT NO.:  [id number]   MEDICAL RECORD NO.:  [id number]          PATIENT TYPE:  INP   LOCATION:  1444                         FAC[facility]  [facility]   PHYSICIAN:  Michiel Cowboy, MDDATE OF BIRTH:  1950-06-13   DATE OF ADMISSION:  04/29/2008  DATE OF DISCHARGE:                              HISTORY & PHYSICAL   PRIMARY CARE PHYSICIAN:  Rosalyn Gess. Norins, M.D.   CHIEF COMPLAINT:  Chest pain.   HISTORY OF PRESENT ILLNESS:  The patient is a 60 year old female with  past medical history significant for mitral valve prolapse, otherwise  unremarkable.  On the 22nd of December the patient suffered a knee  injury and since then has been laid up in bed, was barely ambulating to  the bed due to severe knee pain.  Yesterday she noticed chest pain which  was worse on inspiration and deep breathing, mostly pleuritic in nature.  This was persistent for the past 24 hours, although somewhat improved by  pain medication here.  It does not seem to be worse on exertion.  It is  fairly easy to reproduce with deep inspiration but does not hurt when  the patient is just breathing normally.  The patient is also somewhat  short of breath, tachycardic, and initial D-dimer was elevated.  A CT  scan of her chest was inconclusive at which point [facility] hospitalists was  called for further evaluation for the bowel.   REVIEW OF SYSTEMS:  Otherwise review of systems, no fevers, no chills,  no nausea, no vomiting, no constipation, no diarrhea.  There is some  lower extremity swelling and particularly left worse than right, which  is the same knee that she has injured.   PAST MEDICAL HISTORY:  Significant for mitral valve prolapse.   SOCIAL HISTORY:  The patient never smoked or drank alcohol.  Lives at  home.  She has a husband who is very supportive.   FAMILY HISTORY:  Significant for sister with heart failure at the age of  76.   ALLERGIES:  1. MOTRIN.  2. PHENERGAN.  3. SULFA.   MEDICATIONS:  1. Oxycodone/acetaminophen as needed for knee pain.  2. AcipHex 10 mg twice a day.  3. Multivitamins.   PHYSICAL EXAMINATION:  VITAL SIGNS:  Temperature 98.3, blood pressure  134/86, initial pulse 83, but was up to 110 lately.  Respirations 20,  saturating 97% on room air.  GENERAL:  The patient appears to be in no acute distress currently.  HEENT:  Head nontraumatic.  Somewhat dry mucous membranes but normal  skin turgor.  LUNGS:  The patient is unable to take very deep breaths, but no wheezes  or crackles noted.  HEART:  Regular rate and rhythm although somewhat rapid. No murmurs  could be appreciated.  ABDOMEN:  Soft, nontender, nondistended.  EXTREMITIES:  Lower extremities:  Diameter of left leg slightly larger  than right.  It is very tender knee to palpation.  I suspect there is  some fluid in the left knee.  NEUROLOGIC:  Neurologically intact.   LABORATORY DATA:  White  blood cell count unobtained, hemoglobin 13.6.  Sodium 141, potassium 3.8, creatinine 1.  Cardiac markers negative.  D-  dimer 1.3.   EKG showing sinus tachycardia, rate 102, T-wave flattening in lead II,  III.  T-wave inversion in lead V1.  Flattening of V2, V3, V5, V4, and  inversion in lead aVF.  No old EKG available for comparison.   Chest x-ray showing poor inspiration with probable basilar atelectasis,  , cardiomegaly and possibly mild  congestion, basilar opacities, right  greater than left.  Cannot exclude pneumonia.  Recommend 2-view chest to  assess further and the CT scan angiogram of her chest showing  cardiomegaly, small right pleural effusion, no PE is identified but  sensitivity is reduced due to  motion, in particular the significance of  right lower lobe pulmonary artery which is indeterminate due to motion-  related breathing.  Of note, the patient is having chest pain, more on  the right than the left.   Ground glass opacity in the right lower lobe,  nonspecific, but may  represent alveolitis.   There is also a small nodule on the inferior aspect of left thyroid  lobe.   ASSESSMENT/PLAN:  This is a 60 year old female with no significant past  medical history who presents with chest pain, elevated D-dimer, and CT  scan which could not rule out a pulmonary edema.  There is also left  lower extremity swelling which attributes to a knee injury.  1. Chest pain.  Will treat as a presumptive PE for right now since the      patient has fairly high risk factors and has pleuritic chest pain.      Will start her on Lovenox and Coumadin.  Will obtain Dopplers of      lower extremities.  Will cycle cardiac enzymes, obtain fasting      lipid panel, check hemoglobin A1c.  At this point EKG is      nonspecific.  Will order 2-D echo given cardiomegaly.  Will make      sure the patient is pain-controlled on Ultram.  The patient is      allergic to Motrin.  2. Question alveolitis.  Repeat chest x-ray in a.m. to see if we can      get a little bit of better inspiration after the patient has better      pain control.  See if this is actually a true finding.  3. Thyroid nodule. Will obtain TSH.  The patient will likely need a      thyroid ultrasound sometime.  This could be      done during this admission or as an outpatient.  4. Prophylaxis.  Protonix with Coumadin.   Dr. Raenette Rover. Leschber to assume care of the patient in a.m.      Michiel Cowboy, MD  Electronically Signed     AVD/MEDQ  D:  04/29/2008  T:  04/29/2008  Job:  [id number]   cc:   Rosalyn Gess. Norins, MD  [address]  [place]  [place] [address]

## 2010-09-06 NOTE — Discharge Summary (Signed)
Jennifer [name], Jennifer [name]                  ACCOUNT NO.:  [id number]   MEDICAL RECORD NO.:  [id number]          PATIENT TYPE:  INP   LOCATION:  5522                         FACILITY:  [facility]   PHYSICIAN:  Barbette Hair. Artist Pais, DO      DATE OF BIRTH:  April 03, 1951   DATE OF ADMISSION:  07/24/2008  DATE OF DISCHARGE:  07/26/2008                               DISCHARGE SUMMARY   DISCHARGE DIAGNOSES:  1. Left-sided flank pain secondary to presumed muscle strain.  2. History of pulmonary embolism.  3. Gastroesophageal reflux disease.   DISCHARGE MEDICATIONS:  1. Aciphex 20 mg twice daily.  2. Coumadin - resume previous dosing regimen.  3. Skelaxin 800 mg three times a day as needed.  The patient advised      not to take with Restoril.  4. Restoril 30 mg at bedtime as needed.  5. Tramadol 50 mg 1 tablet twice a day as needed.   FOLLOWUP INSTRUCTIONS:  The patient advised to follow up with her  primary care physician Dr. Debby Bud.  The patient is to call his office  for an appointment within 1 week.   HOSPITAL COURSE:  The patient is a 59 year old female with history of  pulmonary embolism who presented with acute left sided flank pain.  There was concern the patient may have recurrence of pulmonary embolism  or kidney stone and the patient was admitted for further workup.  CAT  scan of the chest, abdomen and pelvis was obtained.  CAT scan of chest  was negative for pulmonary embolism or thoracic aneurysm/dissection.  CAT scan of the abdomen showed mild intrahepatic biliary prominence most  likely related to cholecystectomy.  Small right paramedian  supraumbilical ventral hernia containing fat was noted.  A stable 8-mm  hypodense lesion within the mid left kidney unchanged from July 31, 2005  and was thought to represent a cyst.  CT of the pelvis was benign.  Due  to concerns of spinal stenosis, an MRI of lumbar spine was obtained.  It  showed minimal spondylosis of the lower lumbar spine.  No central  canal  or foraminal stenosis was noted.   The patient's old back pain improved with tramadol.  She did experience  mild nausea.   LABORATORY DATA:  CBC July 24, 2008 showed WBC 3.9, H&H of 12.6 and  37.3, platelet count of 248.  INR on discharge was 2.9.  Basic metabolic  profile shows sodium 141, potassium 3.6, chloride 107, CO2 27, glucose  94, BUN 19, serum creatinine of 0.8.  Urine pregnancy was negative.  The  UA showed specific gravity of 1.027, urine glucose was negative, blood  was negative, nitrite and leukocytes were negative.   CONDITION ON DISCHARGE:  The patient's left-sided back/flank pain was  improved with tramadol.  She was felt medically stable for discharge.  The patient advised to follow up with her primary care physician to  consider outpatient physical therapy or other treatment modalities.      Barbette Hair. Artist Pais, DO  Electronically Signed     RDY/MEDQ  D:  07/26/2008  T:  07/27/2008  Job:  [id number]   cc:   Rosalyn Gess. Norins, MD

## 2010-09-06 NOTE — Assessment & Plan Note (Signed)
[facility]                                 ON-CALL NOTE   NAME:Jennifer [name], Jennifer [name]                         MRN:          [id number]  DATE:02/02/2008                            DOB:          February 16, 1951    PRIMARY CARDIOLOGIST:  Pricilla Riffle, MD, [facility]   [name] is wearing a Passenger transport manager for 30 days.  I received a  call from the company stating that she had gone into 2:1 heart block.  Her ventricular rate was between 50 and 80 the whole time.  She was not  significantly bradycardic at any time.  I contacted the patient, and she  stated she had some palpitations, but they had resolved.  I reassured  her that although we were catching them on the monitor, her heart rate  was fine and let her know that I would call the office to get her a  followup appointment.      Theodore Demark, PA-C  Electronically Signed      Madolyn Frieze. Jens Som, MD, [facility]  Electronically Signed   RB/MedQ  DD: 02/02/2008  DT: 02/02/2008  Job #: [id number]

## 2010-09-06 NOTE — Letter (Signed)
February 28, 2008    Jennifer Gess. Norins, MD  [address]  [place], [place] [address]   RE:  Jennifer, [name]  MRN:  [id number]  /  DOB:  [birth year]   Dear Jennifer [name],   It was my pleasure to see Jennifer [name] in EP consultation today.  As you  are aware, she is a very pleasant 60 year old female with a history of  gastroesophageal reflux disease and dysphagia status post esophageal  dilatation and prior Nissen fundoplication who now presents for further  evaluation and management of presyncope.  The patient reports that over  the past year she has had episodes of a sensation of being tired  followed by a calming sensation lasting several seconds.  These  episodes occur several times per month.  She most frequently notices  these episodes when lying and resting at night.  She denies syncope.  She has had several episodes, however, where she has risen quickly from  a seated position and has fallen.  These episodes were preceded by a  similar sensation of being very tired and slightly confused.  She also  notes rare fatigue.  She denies chest pain, palpitations, or other  concerns.  She was evaluated by you and had an event monitor placed  January 20, 2008, through February 09, 2008.  This revealed an adequate  heart rate histogram response with no significant bradycardias.  The  patient was however found to have rare Mobitz I second-degree AV block.  No other arrhythmias or pauses were detected.   PAST MEDICAL HISTORY:  1. GERD.  2. Dysphagia with prior esophageal dilatation.  3. Status post Nissen fundoplication with subsequent diaphragmatic      herniation requiring repeat procedures.  4. Diverticulosis.  5. Irritable bowel syndrome.  6. Mild obstructive sleep apnea, currently not using CPAP.  7. History of Arnold-Chiari malformation status post surgery in 1993.  8. Status post mammoplasty.  9. Status post appendectomy.  10.Status post total abdominal hysterectomy.  11.Status post  cholecystectomy.   ALLERGIES:  MORPHINE, SULFA, and PHENERGAN.   CURRENT MEDICATIONS:  1. Restoril 30 mg at bedtime.  2. Aciphex 20 mg daily.  3. Hydrochlorothiazide 12.5 mg daily.  4. Multivitamin daily.   SOCIAL HISTORY:  The patient lives in [place] with her spouse.  She  works for the post office.  She denies tobacco, alcohol, or drug use.   FAMILY HISTORY:  Diabetes and coronary artery disease.   REVIEW OF SYSTEMS:  All systems were reviewed and negative except as  outlined in the HPI above.   PHYSICAL EXAMINATION:  VITALS:  Blood pressure 120/76, heart rate 88,  respirations 18, weight 238 pounds.  GENERAL:  The patient is a well-appearing female in no acute distress.  She is alert and oriented x3.  HEENT:  Normocephalic, atraumatic.  Sclerae clear.  Conjunctivae pink.  Oropharynx clear.  NECK:  Supple.  No JVD, lymphadenopathy, or bruits.  LUNGS:  Clear to auscultation bilaterally.  HEART:  Regular rate and rhythm.  No murmurs, rubs, or gallops.  GI:  Soft, nontender, nondistended.  Positive bowel sounds.  EXTREMITIES:  No clubbing, cyanosis, or edema.  NEUROLOGIC:  Strength and sensation are intact.  SKIN:  No ecchymosis or lacerations.  MUSCULOSKELETAL:  No deformity or atrophy.  PSYCHIATRIC:  Euthymic mood.  Full affect.   EKG, normal sinus rhythm at 88 beats per minute, nonspecific ST/T-wave  changes.   IMPRESSION:  Jennifer [name] is a pleasant 60 year old female who presents for  further evaluation and management of presyncope and Mobitz I second-  degree atrioventricular block.  The patient's presyncopal episodes or  most consistent with vasovagal and orthostatic presyncope.  This may be  secondary to the patient's prior Nissen fundoplication procedures and  chronic dysphagias.  She has Mobitz I atrioventricular block on EKG,  though I do not think that this warrants treatment at this time.  The  patient's overall heart rate response is very good and I do not  think  that she would require a pacemaker.   PLAN:  Therapeutic strategies for presyncope were discussed with the  patient today.  These include lifestyle modifications including rising  slowly from a seated position and lying down when she feels presyncopal.  I have encouraged the patient to maintain adequate hydration.  Should  she develop worsening symptoms of presyncope or syncope, I would be  interested in evaluating her further at that time.   I appreciate the opportunity of participating in the care of this  patient.  Please feel free to contact me should any future problems  arise.    Sincerely,      Jennifer Range, MD  Electronically Signed    JA/MedQ  DD: 02/28/2008  DT: 02/29/2008  Job #: [id number]   CC:    Pricilla Riffle, MD, [facility]

## 2010-09-06 NOTE — Assessment & Plan Note (Signed)
[facility]                                 ON-CALL NOTE   NAME:[name], Jennifer                           MRN:          [id number]  DATE:05/07/2008                            DOB:          1951/03/31    PRIMARY CARE Vylette Strubel:  Rosalyn Gess. Norins, MD   The patient called in complaining of chest pain and severe shortness of  breath.  She was discharged from the hospital 2 days ago with a  pulmonary embolism.  I promptly told the patient that she needed to go  immediately to the hospital and recommended going by 911 ambulance.     Juleen China, MD    STC/MedQ  DD: 05/07/2008  DT: 05/08/2008  Job #: [id number]

## 2010-09-06 NOTE — Assessment & Plan Note (Signed)
[place] [facility]                         GASTROENTEROLOGY OFFICE NOTE   NAME:[name], Jennifer TRUEBA                         MRN:          [id number]  DATE:10/09/2006                            DOB:          Feb 13, 1951    CHIEF COMPLAINT:  Dysphagia with esophageal stenosis and stricture,  status post three Nissen fundal plications.   HISTORY:  Jennifer [name] has been a patient of Dr. [name] Gubler.  He has dilated  her for her post-fundal plication (number three) dysphagia problems and  esophageal stenosis.  The last was on February 15, 2006.  She has seen  Dr. Johna Sheriff and Dr. Jacquenette Shone at [facility].  Dr. Jacquenette Shone indicated that she  ought to have a dilation monthly over one year and, if that did not  resolve her problem, then he would consider surgery.  She has not lost  weight and she continues to eat sweets.  It sounds like she eats a lot  of soft foods that are high in sugar.  She knows she should not do this.   She describes a globus sensation that is fairly constant and a lot of  painful swallowing and impact dysphagia.  The dilations that she has had  have helped her for a week or two, it sounds like.   PAST MEDICAL HISTORY:  Gastroesophageal reflux disease, status post  Nissen fundal plication times three.  It sounds like the initial was in  April of 2000.  She had classic heartburn, unresponsive to medical  management.  She had a normal esophageal manometry, she had esophagitis  on endoscopy and positive pH probes.  She had laparoscopic fundal  plication and crural repair of moderate-sized hiatal hernia.  She  required an open repair of hiatal hernia, July 25, 1999.  She  subsequently then had her third surgery at [facility] with Dr.  Sima Matas and had mesh placed and had abnormal motility and was  thought to have probable herniation of a small area of the wrap through  the diaphragm.  She had Gore-Tex patch repair of a recurrent hiatal  hernia.  She did well and  then had progressive dysphagia, worsening over  the last couple of years.  She has a lot of constant epigastric and  substernal pain, as well.   Other medical problems include prior hysterectomy and appendectomy.  Irritable bowel syndrome.  Breast reduction surgery.  Brain stem surgery  for congenital abnormality and a history of mitral valve prolapse and  obesity.   PHYSICAL EXAM:  Limited to vital signs.  This is a pleasant, middle-aged  black woman, in no acute distress.  Weight 256 pounds, pulse 68, blood  pressure 120/82.   ASSESSMENT:  This is a complicated situation.  She has stenosis of the  esophagus after a wrap and a lot of symptoms that may be related to  that, but perhaps not all of them are, in my opinion.  Dr. Jacquenette Shone' plan  sounds reasonable, but it is certainly labor intensive.   PLAN:  1. I will review her upper GI/barium swallow from October 2007.  2. Note that  apparently there is narrowing related to external mass      effect, presumably from her last surgery.  3. I will communicate with Dr. Jacquenette Shone and also review his records.  I      do not have those at this time.  4. One consideration would be a temporary esophageal stent, though I      am concerned that this would promote worse reflux problems and I      think she would be quite sensitive to that.  If we did so, it would      be appropriate to tie sutures to the proximal portion of the stent      and to try to clip it into the esophagus to try to prevent      migration.  I think that the size, or diameter of the stent      required would create quite a bit of pain in this lady, however.   At this point, we do plan to embark on dilation in July.  Savary versus  Maloney dilators could be used.  I do not know that a balloon would be  better, but it is something to consider and I will discuss.  She will  continue her Prevacid in the meantime.     Iva Boop, MD,FACG  Electronically Signed    CEG/MedQ   DD: 10/09/2006  DT: 10/10/2006  Job #: [id number]   cc:   Lorne Skeens. Hoxworth, M.D.  Ulyess Mort, MD  Rosalyn Gess. Norins, MD

## 2010-09-06 NOTE — Assessment & Plan Note (Signed)
[place] [facility]                         GASTROENTEROLOGY OFFICE NOTE   NAME:Jennifer [name], Jennifer [name]                         MRN:          [id number]  DATE:02/27/2007                            DOB:          April 24, 1951    CHIEF COMPLAINT:  Discuss abdominal pain, dysphagia, and odynophagia.   Jennifer [name] had been having Maloney dilation of the esophagus monthly. She  has done that about three times. She also had a laparoscopic  cholecystectomy for gallbladder dyskinesia and right upper quadrant pain  in the past few months. She is starting to have some mild right upper  quadrant pain again, but it is not nearly bad as before. Occasionally,  she will have some very crampy lower quadrant pains, bilateral, with  urgent defecation. Stools are clay colored occasionally, about once  every other week or so. She still has some problems swallowing and when  she drinks certain liquids will have intense pain when she swallows. It  turns out that those are very cold liquids she thinks.   After going through these dilations for three months, she has come to  the conclusion that she does not want to continue this on a monthly  basis. She understands that Dr. Jacquenette Shone would not perform surgery unless  she did this for 12 months.   We talked about her situation today and how her three previous  fundoplications and repairs may be contributing to some of her problems.  I think that she probably has some enhanced visceral sensation as well  and I talked about that. The cold liquids may certainly be inducing some  esophageal spasm and pain in that sense as well.   VITAL SIGNS:  Weight 254 pounds (she says that she is trying to lose),  height 5 feet 9 inches, pulse 66, blood pressure 142/92.   Note that medications are listed and reviewed in the chart. She takes  either Prevacid or Aciphex. Her allergies are listed and updated.   PAST MEDICAL HISTORY:  Reviewed and unchanged from prior  note of  10/09/2006. Otherwise, as updated above.   ASSESSMENT:  1. Dysphagia and odynophagia problems with chest pain after three      fundoplications, the last of which included a mesh repair of a      diaphragm injury.  2. Irritable bowel syndrome.  3. Some post-cholecystectomy symptoms.   PLAN:  1. We are going to observe at this point.  2. I told her to avoid very cold liquids. That may help with some of      the pain she is having.  3. I did prescribe Levsin SL 1 to 2 every 4 hours as needed for crampy      pain. This may help with some of her esophageal symptoms as well.  4. Consider options like a tricyclic agent or something like Neurontin      in the future. Cymbalta might be good if she has not been on that.  5. She is going to forego any surgery at this time, obviously a fourth      operation would be  a huge undertaking and the outcome certainly      could not be guaranteed to be go.     Iva Boop, MD,FACG  Electronically Signed    CEG/MedQ  DD: 02/27/2007  DT: 02/28/2007  Job #: [id number]   cc:   Toy Cookey, MD

## 2010-09-06 NOTE — Consult Note (Signed)
NEW PATIENT CONSULTATION   Jennifer [name], Jennifer [name]  DOB:  March 24, 1951                                       11/02/2008  ZOXWR#:[id number]   Ms. Gowin is a healthy, middle-aged female referred by Dr. Debby Bud for  venous insufficiency, right worse than left leg.  This 60 year old  patient has been having increasingly prominent varicosities along the  anterior aspect of the right thigh extending laterally and also medially  below the knee in the right leg which cause aching, throbbing, and  burning discomfort as the day progresses.  These areas are also quite  hypersensitive with material touching her skin.  She has noticed some  increasing swelling in the ankle as the day progresses.  She has no  history of bleeding, ulceration, or thrombophlebitis, or DVT in the  right leg.  She does have a history of DVT in the left leg which  occurred following a fall in January 2010 and this led to a pulmonary  embolus.  She was treated with Coumadin which was discontinued last  week.  She does not wear elastic compression stockings and does not try  elevation of the legs on a regular basis.  She is taking Tylenol for  pain with some improvement.  She feels that this discomfort is  increasing to the point that it is affecting her daily living secondary  to the pain in her right leg.   PAST MEDICAL HISTORY:  Negative for diabetes, hypertension, coronary  artery disease, COPD, or stroke.  She does have gastroesophageal reflux  disease and has had esophageal strictures dilated on multiple occasions.   PAST SURGICAL HISTORY:  1. She had a procedure for an [place] [address] Chiari malformation in 1993.  2. Hiatal hernia repair.  3. Cholecystectomy.  4. Multiple esophageal dilatations.   FAMILY HISTORY:  Positive for stroke and diabetes in her mother,  congestive heart failure in her sister.  Negative for coronary artery  disease.   SOCIAL HISTORY:  She is married and has 1 child.  She does not use  tobacco or alcohol.   REVIEW OF SYSTEMS:  Positive for weight gain, heart murmur, dysphagia  from her gastroesophageal reflux disease, chronic constipation, as well  as occasional headaches.   ALLERGIES:  To MORPHINE, SULFA, and PHENERGAN.   PHYSICAL EXAM:  Blood pressure is 157/90, heart rate 69, respirations  14.  GENERAL:  She is middle-aged female in no apparent distress, alert and  oriented x3.  NECK:  Supple, 3+ carotid pulses palpable.  No bruits are audible.  NEUROLOGIC:  Normal.  No palpable adenopathy in neck.  CHEST:  Clear to auscultation.  CARDIOVASCULAR:  Regular rhythm.  No murmurs.  ABDOMEN:  Soft, nontender with no masses.  She has 3+ femoral, popliteal, and posterior tibial pulses bilaterally.  Right leg has 1 to 2+ edema at the ankle level.  Left leg has 1+.  She  has some prominent varicosities in the right leg beginning in the medial  thigh extending anteriorly over to the lateral thigh and also in the  medial calf below the knee on the right side.  Left leg has no obvious  varicosities.  There is no hyperpigmentation ulceration in either leg.  No stasis ulcers are noted and no ischemia is noted.   Lower extremity duplex scan was performed today and reveals the left leg  to have no evidence of deep venous obstruction or superficial reflux.  The right leg does have diffuse reflux in the great saphenous vein  beginning in the knee extending to the mid-thigh with a large saphenous  vein up to the junction.  The deep system on the right has no  obstruction.  A small saphenous vein on the right is normal.   This patient does have symptomatic venous disease from gross reflux in  the right great saphenous system causing these painful varicosities.  We  will treat her with elastic compression stockings (20 mm to 30 mm  gradient, long-leg) as well as elevation and ibuprofen on a daily basis.  She will return in 3 months and if there has been no improvement, I  think  she would be a good candidate for 1 laser ablation of the right  great saphenous vein to be followed by 2 courses of sclerotherapy for  the secondary varicosities.   Quita Skye Hart Rochester, M.D.  Electronically Signed   JDL/MEDQ  D:  11/02/2008  T:  11/03/2008  Job:  [id number]   cc:   Rosalyn Gess. Norins, MD

## 2010-09-06 NOTE — Assessment & Plan Note (Signed)
[facility]                         GASTROENTEROLOGY OFFICE NOTE   NAME:Degan, ARTIE TAKAYAMA                         MRN:          [id number]  DATE:03/15/2007                            DOB:          09/03/1950    CHIEF COMPLAINT:  Chest pain.   Amaliya has been having a burning chest pain, similar to what she had has  in the past for about a week and a half.  She is concerned that it is  her diaphragm or her mesh repair to her diaphragm.  She has had problems  like this before.  There is no shortness of breath.  When she coughs  there is a fleeting disappearance of the pain.  She is, otherwise,  unchanged, rotating between Prevacid and Aciphex for her reflux.  She is  not complaining of dysphagia at this time.  There is no exertional  component to this.  There is no productive cough.  She points to  circular area in the midsternal area above and to the right of the mid  sternum.   PAST MEDICAL HISTORY:  Reviewed and unchanged from previous notes, see  those.   MEDICATIONS:  Listed and reviewed.   ALLERGIES:  Listed and reviewed.   PHYSICAL EXAMINATION:  Reveals an obese, pleasant, black woman in no  acute distress.  She does not appear anxious.  Height 5 feet 8-1/2 inches, weight 253 pounds, pulse 76, blood pressure  132/84.  The lungs are clear.  HEART:  S1, S2, no murmurs, gallops, or rubs.  SKIN:  Warm and dry, no rash.  She is alert and oriented x3.  The chest wall is tender in this area, described as a burning pain, more  so on the right sternocostal joints above the breast.  The abdomen is nontender.   ASSESSMENT:  I think she has costochondritis.  She may have underlying  fibromyalgia.   PLAN:  1. Naprosyn 500 mg b.i.d. for 2 to 4 weeks.  2. Heating pad p.r.n.  3. Followup if this is a persistent problem.  Consider rheumatologic      evaluation if not done.     Iva Boop, MD,FACG  Electronically Signed    CEG/MedQ  DD:  03/15/2007  DT: 03/16/2007  Job #: [id number]   cc:   Lorne Skeens. Hoxworth, M.D.  Rosalyn Gess Norins, MD

## 2010-09-06 NOTE — Discharge Summary (Signed)
NAMEFENIX, RUPPE                  ACCOUNT NO.:  [id number]   MEDICAL RECORD NO.:  [id number]          PATIENT TYPE:  INP   LOCATION:  1538                         FACILITY:  [facility]   PHYSICIAN:  Rosalyn Gess. Norins, MD  DATE OF BIRTH:  02-18-1951   DATE OF ADMISSION:  04/29/2008  DATE OF DISCHARGE:  05/05/2008                               DISCHARGE SUMMARY   ADMITTING DIAGNOSES:  1,  Chest pain with shortness of breath.  1. Possible alveolitis.  2. Thyroid nodule.   DISCHARGE DIAGNOSES:  1. Pulmonary embolus to right lower lobe, multiple branches, with      pleuritic chest pain.  2. Thyroid nodule.   HISTORY OF PRESENT ILLNESS:  Ms. Bloch is a 60 year old African American  woman well-known to me followed for mitral valve prolapse.  On December  22 she suffered a knee injury and had been immobilized with very little  ambulation due to severe knee pain.  On the day prior to admission she  noticed chest pain which was worse on inspiration and deep breathing and  pleuritic in nature.  This was persistent for 24 hours.  It did not seem  to be worse with exertion.  It was easily reproducible with deep  inspiration.  Because of this set of symptoms, the patient presented to  the emergency department at [facility].  Initial D-dimer was  markedly elevated.  CT scan of her chest was inconclusive and she was  subsequently admitted to hospital.  Please see the H and P for past  medical history, family history, social history and exam.   HOSPITAL COURSE:  1. Pulmonary:  Patient with elevated D-dimer and pleuritic chest pain      along with shortness of breath suspicious for PE.  Follow-up CT      angio was performed which did reveal the patient to have several      small pulmonary nodules.  She was noted to have pulmonary embolism      in the right lower lobe in several branches.  She had an area that      appeared to be a small right lower lobe pulmonary infarct.  The      patient  had been started on anticoagulation therapy with Coumadin      and was being bridged with Lovenox.  The patient was slow to be      anticoagulated but there were no intervening complications or      problems.  On the day of discharge her INR had gone up to 2.1.  At      this point it is felt she is stable to be able to be discharged      home on Coumadin 5 mg daily with close followup at the coag clinic.  2. Thyroid nodule:  The patient had a thyroid nodule on CT.  This was      not felt to be an acute issue and was to be followed up as an      outpatient.   DISCHARGE EXAMINATION:  Temperature 98.1, blood pressure 110/75,  heart  rate 67, respirations 16, O2 sat is 93% on room air.  GENERAL APPEARANCE:  This is an overweight African American woman  sitting in bed in no acute distress.  HEENT EXAM:  Unremarkable.  CHEST:  Patient is moving air well with no rales, wheezes or rhonchi.  No increased work of breathing.  CARDIOVASCULAR:  2+ radial pulses.  Her precordium was quiet.  She had a  regular rate and rhythm.  No further exam conducted.   FINAL LABORATORY:  INR was 2.1 on day of discharge.  CBC with a  hemoglobin of 11.2 g, white count was 5600, platelet count 282,000.  Urine culture from January 7 with multiple bacterial morphotypes present  with no dominant species.  The patient had a series of cardiac enzymes  with troponin of 0.01, 0.01, 0.01.  Creatinine kinase was negative.  Hemoglobin A1c performed January 6 was 6.1%.  BNP was checked at time of  admission and was less than 30.  TSH performed on January 6 was 2.998.   FINAL RADIOGRAPHIC STUDIES:  CT angio as noted on January 7.  Two views  of the chest also done on January 7 showed small bilateral pleural  effusions with improved pulmonary venous congestion and with inspiratory  effort.   DISCHARGE MEDICATIONS:  The patient will resume all her home medications  which are:  1. AcipHex 20 mg b.i.d.  2. Restoril 15 mg  q.h.s.  3. Multivitamins.  4. OxyContin 5 mg b.i.d. that had been started by her orthopedist for      pain.  5. Coumadin will be added to her list at 5 mg daily until seen in      followup in the coagulation clinic  [facility].   DISPOSITION:  The patient is discharged home.  She is to set up her own  appointment with her orthopedist in regards to her knee pain.  She will  be seen in the coag clinic, appointment to be scheduled for either  Thursday, January 14 or Friday, January 15.   The patient's condition at time of discharge dictation is stable and  improved.      Rosalyn Gess Norins, MD  Electronically Signed     MEN/MEDQ  D:  05/05/2008  T:  05/05/2008  Job:  [id number]   cc:   Lillia Carmel, M.D.  Fax: [phone]

## 2010-09-06 NOTE — Op Note (Signed)
Jennifer [name]                  ACCOUNT NO.:  [id number]   MEDICAL RECORD NO.:  [id number]          PATIENT TYPE:  OIB   LOCATION:  1535                         FACILITY:  [facility]   PHYSICIAN:  Sharlet Salina T. [name], M.D.DATE OF BIRTH:  07/06/1950   DATE OF PROCEDURE:  12/12/2006  DATE OF DISCHARGE:                               OPERATIVE REPORT   PRE AND POSTOPERATIVE DIAGNOSIS:  Right upper quadrant pain/ biliary  dyskinesia.   SURGICAL PROCEDURES:  Laparoscopic cholecystectomy with intraoperative  cholangiogram.   SURGEON:  Sharlet Salina T. [name], M.D.   ASSISTANTAngelia Mould. Derrell Lolling, M.D.   ANESTHESIA:  General.   BRIEF HISTORY:  Jennifer [name] is a 60 year old black female with a complex  surgical history, having undergone laparoscopic Nissen fundoplication  and subsequently two open redo's of recurrent hiatal hernias.  She now  however presents with a new complex of pain and nausea with pain in the  right upper quadrant which is a new and different symptom for her.  This  is clinically worrisome for biliary tract pain, although she is had  negative gallbladder ultrasound.  A HIDA scan was obtained which shows a  markedly low ejection fraction.  After extensive discussion  preoperatively, we elected proceed with laparoscopic cholecystectomy  with cholangiogram in an effort to relieve her symptoms.  Nature of  procedure, its indications, possible need for open procedure, risks of  bleeding, infection, bile leak, bile duct injury and failure to relieve  her pain have been discussed understood.  She is now brought to the  operating room for this procedure.   DESCRIPTION OF OPERATION:  The patient brought to operating room and  placed in the supine position on the operating table and general  orotracheal anesthesia was induced.  She received preoperative  antibiotics.  PAS were placed.  The abdomen was widely sterilely prepped  and draped.  Correct patient and procedure were  verified.  Access was  obtained after infiltration of local anesthesia in the right upper  quadrant with a 5 mm OptiVu trocar without difficulty and  pneumoperitoneum established.  There were fairly extensive adhesions  along her upper midline incision but the right upper quadrant was free  of adhesions.  Under direct vision, a second 5 mm trocar was placed in  the right upper quadrant.  Through these two ports using 5 mm scope,  omental adhesions were taken down back toward the midline in the upper  abdomen and the right upper quadrant was completely cleared back to the  midline.  These were all omental adhesions without bowel adhesions.  There were adhesions of the colon more medially, but these were able to  be left undisturbed.  There was a small amount of bleeding from the  omentum.  This was completely controlled with cautery.  After  adhesiolysis enabled appropriate ports to be put in under direct vision,  an 11 mm trocar was placed just to the right and below the xiphoid under  direct vision and another 11 mm trocar in the umbilicus under direct  vision.  The gallbladder was visualized  and did not appear grossly  abnormal.  The fundus grasped, elevated over the liver and infundibulum  retracted inferolaterally.  Acute duodenal adhesions were taken down  with careful blunt dissection.  Peritoneum anterior and posterior to  Calot's triangle was incised and the distal gallbladder and Calot's  triangle was thoroughly dissected and skeletonized.  The cystic duct and  gallbladder junction was dissected 360 degrees and the cystic artery  identified.  After thorough dissection and the anatomy appeared clear,  the cystic duct was clipped at the gallbladder junction and operative  cholangiogram obtained through the cystic duct.  This showed good  filling of an upper limits of normal sized common bile duct and  intrahepatic ducts with free flow into the duodenum and no filling  defects.   There was no evidence of stricturing.  The cholangiocath was  then removed and the cystic duct was triply clipped proximally and  divided.  The cystic artery which already been singly clipped was  further clipped and divided.  The gallbladder then dissected free from  its bed using hook cautery, was placed in EndoCatch bag and brought out  through the umbilical port site.  The right upper quadrant was then  thoroughly irrigated, complete hemostasis assured.  The area of  adhesiolysis was completely inspected through a 5 mm camera in the right  upper quadrant trocar and there was no evidence of any bleeding and  certainly no evidence of bowel injury and again no bowel adhesions were  really necessary to be taken down.  After assurance of hemostasis all  CO2 was evacuated.  Trocars removed.  Skin incisions were closed with  interrupted subcuticular 4-0 Monocryl and Dermabond.  Sponge, needle and  instrument counts were correct.  The patient taken to recovery in good  condition.      Jennifer [name], M.D.  Electronically Signed     BTH/MEDQ  D:  12/12/2006  T:  12/13/2006  Job:  [id number]   cc:   Iva Boop, MD,FACG  [facility]  [address] [place], [place] [address]   Ulyess Mort, MD  [address]  [place]  [place] [address]   Rosalyn Gess. Norins, MD  [address]  [place]  [place] [address]

## 2010-09-06 NOTE — Procedures (Signed)
LOWER EXTREMITY VENOUS REFLUX EXAM   INDICATION:  Varicose veins.   EXAM:  Using color-flow imaging and pulse Doppler spectral analysis, the  bilateral common femoral, superficial femoral, popliteal, posterior  tibial, greater and lesser saphenous veins are evaluated.  There is  evidence suggesting deep venous insufficiency at the bilateral common  femoral and left popliteal vein levels.   The bilateral saphenofemoral junctions are competent.  The right GSV is  not competent (with reflux >500 millisecond)  as described below.  The  left GSV is competent.   The bilateral proximal short saphenous veins demonstrate competency.   GSV Diameter (used if found to be incompetent only)                                            Right    Left  Proximal Greater Saphenous Vein           0.57 cm  cm  Proximal-to-mid-thigh                     0.66 cm  cm  Mid thigh                                 0.5 cm   cm  Mid-distal thigh                          0.4 cm   cm  Distal thigh                              0.4 cm   cm  Knee                                      0.4 cm   cm   IMPRESSION:  1. Right greater saphenous vein reflux  ( with >500 millisecond) is      identified as described above and on the attached worksheet.  No      left greater saphenous vein reflux was identified.  2. The bilateral greater saphenous veins are not aneurysmal.  3. The deep venous system is not competent, as described above.  4. The bilateral lesser saphenous veins are competent.         ___________________________________________  Quita Skye. Hart [place], M.D.   CH/MEDQ  D:  11/02/2008  T:  11/02/2008  Job:  [id number]

## 2010-09-06 NOTE — Assessment & Plan Note (Signed)
[facility]                         GASTROENTEROLOGY OFFICE NOTE   NAME:Jennifer [name], Jennifer [name]                         MRN:          [id number]  DATE:06/19/2007                            DOB:          21-Jul-1950    CHIEF COMPLAINT:  Chest pain, dysphagia.   Younique is still having a constant burning pain.  She tried Naprosyn but it  did not seem to help for what I thought was costochondritis.  She also  still has intermittent dysphagia and has to induce vomiting.  Sometimes  she just produces phlegm or thick saliva, sometimes she produces a piece  of food.  She feels something when she takes a deep breath and believes  it is the mesh related to her diaphragm.  She realizes that surgery is  not a good option at all and she is not really interested in that.  She  would rather take AcipHex than Prevacid.  Apparently she had been on  both of those and the pharmacy wanted that clarified.  She has been  working out some and trying to diet some and loose and some weight.  We  had about a 20-minute conversation overall, and I explained to her that  I thought her dysphagia could very well be related to her prior surgery  and the stenosis that is seen at the gastroesophageal junction on the  barium study; however, when I perform an endoscopy it is not all that  abnormal.  I explained to her that proton pump inhibitor therapy is  unlikely to help this constant burning as I do not think she is having  much in the way of reflux, though it is certainly reasonable to continue  the PPI therapy if she feels better on it.  She is still tender over her  chest wall.  Though there are not other musculoskeletal complaints at  this time, I do think she has some form of costochondritis.  She  indicates that she has to sleep in a sports bra or she will have  terrible chest wall pain.  I explained to her the difference between the  chest wall and a deeper pain, i.e., esophageal problem  and  musculoskeletal or chest complaints.  She remains on PPI therapy.  She  is on a multivitamin.  She takes half a hydrochlorothiazide daily.   ALLERGIES:  MORPHINE, SULFA and PHENERGAN.   PAST MEDICAL HISTORY:  1. Postoperative chest pain and dysphagia and odynophagia after three      fundoplications, the last of which includes a mesh repair with      diaphragmatic injury.  2. Irritable bowel syndrome.  3. Status post recent cholecystectomy for gallbladder dyskinesia.  4. Obesity.  5. Mitral valve prolapse.  6. Anxiety and depression.  7. Prior history.  8. Prior appendectomy.  9. Breast reduction surgery.  10.Tinnitus, unclear etiology.   PHYSICAL:  Weight 252 pounds, pulse 68, blood pressure 120/78.  The chest wall remains diffusely tender.   ASSESSMENT:  1. Chronic chest pain.  I think this is multifactorial.  She does  appear to have a costochondritis or a musculoskeletal chest wall      syndrome.  2. Burning in the chest with odynophagia.  I am not sure where this is      coming from, and I explained to her that it was hard to say.  3. Status post open failed fundoplication repair with mesh insertion      in the diaphragm.  Question symptoms from that, perhaps.  When she      takes a deep breath she feels pressure, and it could be related.  4. Question underlying anxiety related to these problems.  She      continues to be concerned about the cause of the symptoms despite      reassurances.  She has had some chest nodules seen at previous CT,      Dr. Debby Bud has followed these up.  She also has a stable left lobe      thyroid nodule.  It was thought that the nodules were likely due to      previous infection and they were stable over a year, and so I tried      to reassure her about those as well.   My current plan is to refill the AcipHex 20 mg b.i.d., since she says  that helps.  We are not going to dilate any further for her dysphagia  problems.  I wonder if  there is not almost a behavioral issue now with  this induction of regurgitation, but it is very hard to sort it all out.  I am also going to refer her to Erick Colace, M.D., who is a  physical medicine and pain specialist, to see if we cannot do something  more about her chest wall pain.  The fact that she must wear a sports  bra at night to relieve her symptoms certainly suggests that there is  some sort of chest wall process here.  I will see her back as needed.     Iva Boop, MD,FACG  Electronically Signed    CEG/MedQ  DD: 06/19/2007  DT: 06/20/2007  Job #: [id number]   cc:   Rosalyn Gess. Norins, MD  Erick Colace, M.D.

## 2010-09-06 NOTE — Assessment & Plan Note (Signed)
[facility]                         GASTROENTEROLOGY OFFICE NOTE   NAME:Jennifer [name], Jennifer [name]                         MRN:          [id number]  DATE:09/05/2006                            DOB:          01-27-1951    Jennifer [name] comes in.  She says she is still having a lot of pain in her chest.  She saw the surgeon at [facility], as per Dr. Johna Sheriff, and he indicated that  he really did not want to operate on her for at least a year and  hopefully, within the next 12 months, she will have some dilatations of  her esophagus with gradually increasing the size of the dilators over  the 80-month period and her __________ .  He is hopeful that, by doing  this, we can dilate her esophagus to the point where she will not have  any further symptomatology.  These would have to be very gradual  dilations.  I did dilate her last in October 2007 and used 16 and 17 mm  Savary dilators.  I held each one of these for 30 seconds and I did have  trauma and we have not dilated her since.  In the meantime, I wonder  whether some of these symptoms are from the adhesions of the scar tissue  of the mass, whether it is ischemic postoperatively and so on, because  this is so unusual to have this type pain.  I do not think it is mucosal  or from acid reflux and so on.  I told [name] that I would be referring  her to Dr. Leone Payor for his consideration of doing this, that she really  did not need to go to [facility] for her dilatations.  I told her that my  partners could certainly do this for her and I explained to her and I  explained to her why it needed to be done on a monthly basis, so that  she would not have redevelopment of scar tissue and so on and so that  she would have to start over each time, but in any case, I am hopeful  that this will be helpful to her and she will be able to avoid the  surgery, but if not, I am glad that the surgeon at [facility] was willing to  consider doing this.     Ulyess Mort, MD  Electronically Signed    SML/MedQ  DD: 09/05/2006  DT: 09/05/2006  Job #: [id number]

## 2010-09-06 NOTE — Assessment & Plan Note (Signed)
OFFICE VISIT   Jennifer [name], Jennifer [name]  DOB:  Jan 14, 1951                                       03/22/2009  WCBJS#:[id number]   The patient returns today for initial follow-up regarding her laser  ablation of the right great saphenous vein for painful varicosities and  venous insufficiency of the right leg performed 1 week ago.  She has had  some mild discomfort in the medial thigh over the course of the great  saphenous vein as one would expect, which has been improved by the  elastic compression, although she is having a lot of irritation on her  proximal thigh from the compression stockings.  She has noticed no edema  in the ankle and states that the leg in general already feels better  than it did prior to the procedure.  Denies any distal edema or pain in  the calf area.   She has had no chest pain, dyspnea on exertion, PND, orthopnea,  hemoptysis, asthma, wheezing, chronic bronchitis or any other type of  the new symptomatology.   PHYSICAL EXAMINATION:  Today her blood pressure is 156/86, heart rate 73  respirations 18.  Chest:  Clear to auscultation.  Abdomen:  Soft,  nontender with no masses.  She has 3+ femoral, popliteal and dorsalis  pedis pulses.  There is no distal edema in the right leg.  The right  thigh has some mild discomfort over the great saphenous vein as one  would expect, but no blistering or erythema is noted.   Venous duplex exam was performed today and ordered by me and reviewed by  me and reveals no evidence of deep venous obstruction.  There is total  closure of the right great saphenous vein from near the saphenofemoral  junction to the left knee.  I reassured her regarding these findings and  will schedule her for two courses of sclerotherapy in the near future to  complete her treatment.   Quita Skye Hart Rochester, M.D.  Electronically Signed   JDL/MEDQ  D:  03/22/2009  T:  03/23/2009  Job:  [id number]

## 2010-09-06 NOTE — Procedures (Signed)
DUPLEX DEEP VENOUS EXAM - LOWER EXTREMITY   INDICATION:  Follow up right greater saphenous vein ablation.   HISTORY:  Edema:  Right lower extremity  Trauma/Surgery:  Right greater saphenous vein ablation, 03/15/09  Pain:  Right lower extremity  PE:  Yes  Previous DVT:  Left lower extremity  Anticoagulants:  No  Other:   DUPLEX EXAM:                CFV   SFV   PopV  PTV    GSV                R  L  R  L  R  L  R   L  R  L  Thrombosis    o  o  o     o     o      +  Spontaneous   +  +  +     +     +      0  Phasic        +  +  +     +     +      0  Augmentation  +  +  +     +     +      0  Compressible  +  +  +     +     +      0  Competent     D  +  +     D     +      0   Legend:  + - yes  o - no  p - partial  D - decreased   IMPRESSION:  1. No evidence of deep venous thrombosis in the right lower extremity      or left common femoral vein.  2. Evidence of ablation in right greater saphenous vein from near      saphenofemoral junction to knee with no flow.  3. Evidence of mild venous insufficiency in right common femoral vein      and popliteal vein.      _____________________________  Quita Skye. Hart Rochester, M.D.   AS/MEDQ  D:  03/22/2009  T:  03/22/2009  Job:  [id number]

## 2010-09-09 NOTE — Assessment & Plan Note (Signed)
[name]                           GASTROENTEROLOGY OFFICE NOTE   NAME:[name], [name]                         MRN:          [id number]  DATE:02/07/2006                            DOB:          04-15-1951    Jennifer [name] comes in and says she is having trouble swallowing again.  She is  status post fundoplication and has had terrible problems with dysphagia  since that procedure.  Her last test I did was in February 2007 and I used a  16 Savary dilator and left in place for 90 seconds.  She said it lasted  about a month, but her symptoms started recurring.  Nevertheless, she says  she is doing fairly well except for her stomach aching at times.  She does  have some continued reflux and vomits at times.   On physical examination, she weighs 257, blood pressure 130/64, pulse 68 and  regular.  Oropharynx was negative.  Neck was negative.  Chest was clear.  Heart revealed a regular rhythm.  Abdomen was soft, no masses or  organomegaly.  Extremities are unremarkable.  Rectal exam was deferred.   IMPRESSION:  1. Dysphagia and gastroesophageal reflux disease related to fundoplication      several years previous.  2. Moderate obesity.  3. Anxiety and depression.  4. History of mitral valve prolapse.  5. History of irritable bowel syndrome with alternating diarrhea and      constipation.   RECOMMENDATIONS:  Get a barium swallow with a tablet, get an ultrasound of  her abdomen because she also is complaining of some right upper quadrant  discomfort.  Put her on some MiraLax.  Schedule for EGD with dilatation  depending on results of the x-rays.  I think it should be done again and  hopefully will be more helpful than last time.            ______________________________  Ulyess Mort, MD      SML/MedQ  DD:  02/07/2006  DT:  02/09/2006  Job #:  [id number]

## 2010-09-09 NOTE — Op Note (Signed)
NAMESALISA, Jennifer [name]                  ACCOUNT NO.:  [id number]   MEDICAL RECORD NO.:  [id number]          PATIENT TYPE:  OUT   LOCATION:  XRAY                         FACILITY:  [facility]   PHYSICIAN:  Yaakov Guthrie. Shon Hough, M.D.DATE OF BIRTH:  Nov 14, 1950   DATE OF PROCEDURE:  05/02/2004  DATE OF DISCHARGE:  04/28/2004                                 OPERATIVE REPORT   A 60 year old lady with severe macromastia and back and shoulder pain  secondary to large, pendulous breasts.  She also has increased  intertriginous changes as well as pitting of both shoulder areas with  hyperpigmentation secondary to the pull of the weight of the breasts in the  past.   PROCEDURE:  Bilateral breast reductions using the inferior pedicle  technique.   SURGEON:  Yaakov Guthrie. Shon Hough, M.D.   ANESTHESIA:  General.   DESCRIPTION OF PROCEDURE:  The patient underwent general anesthesia and  intubated orally.  Preoperatively, the patient was sat up and drawn for the  inferior pedicle reduction mammoplasty.  Prep was done with Hibiclens and  sulfa solution and walled off with sterile towels and drapes so as to make a  sterile field.  The wounds were scored with a #15 blade and the skin of the  inferior pedicle was deepithelialized with a #20 blade.  The medial and  lateral fat to dermal pedicles was excised down to the underlying fascia.  Bilaterally, more tissue was taken.  The new keyhole area was then debulked  and the flaps were then transposed and stayed with a 3-0 Prolene suture.  Subcutaneous closure was done with 3-0 Monocryl x2 layers and a running,  subcuticular suture of 3-0 Monocryl and 5-0 Monocryl throughout the inverted  T.  The wounds were drained with a #10 Blake drain, __________ flat and  placed in the depths of the wound and brought out through the lateral-most  portion of the incision and secured with 3-0 Prolene.  The wounds were  cleansed, Steri-Strips and soft dressings were applied to all  areas.  She  withstood the procedure very well and was taken to recovery in excellent  condition.      GLT/MEDQ  D:  05/02/2004  T:  05/02/2004  Job:  [id number]

## 2010-09-09 NOTE — Letter (Signed)
May 30, 2006    Lorne Skeens. Hoxworth, M.D.  [address]  [place], [place] [address]   RE:  Jennifer [name], Jennifer [name]  MRN:  [id number]  /  DOB:  1950-10-16   Dear Romeo Apple:   I appreciate your seeing Jennifer [name].  I understand you did the initial  fundoplication on her, and she then subsequently had a followup  procedure at [facility], but was not pleased with the way she was  handled there.  She will not go back to [facility].  In the meantime I  have tried to dilate her to help her symptomatically, but the dilatation  even with Savary dilators has not been very effective.  She now says she  is having continuous discomfort in the esophageal area and she just is  having a hard time dealing with it, and I advised her to re-consult you  concerning any possible therapies that you might consider to try to help  eliminate her symptoms.  I do appreciate your seeing her and working  with me concerning this very nice mutual patient of ours.  With best  personal regards.    Sincerely,      Ulyess Mort, MD  Electronically Signed    SML/MedQ  DD: 05/30/2006  DT: 05/31/2006  Job #: [id number]

## 2010-09-09 NOTE — Assessment & Plan Note (Signed)
[facility]                         GASTROENTEROLOGY OFFICE NOTE   NAME:Jennifer [name], Jennifer [name]                         MRN:          [id number]  DATE:05/30/2006                            DOB:          1951/04/01    Jennifer [name] says she is having pain in her esophagus, my dilatation really was  not very effective, did not last very long at all if at all.  She is  very upset about it, she is very emotional about it.  She says something  has to be done, it is causing a lot of discomfort, and I really told her  I did not have anything else I knew to do medically, and I thought that  she ought to get a consult from [facility], who operated on her  initially.  She does not want to go back to the physician at [facility]  who operated on her, and is willing to go back and see Dr. Johna Sheriff.  I  am worried that this might need to be taken down again, but I am not  sure if it is possible, and I think all of this is really a surgical  decision.  I think that it is too tight and that it is causing her a lot  of discomfort, and something more definitive needs to be done than what  we can do medically.   IMPRESSION:  1. Gastroesophageal reflux disease in a patient who is status post      fundoplication and has been dilated without satisfactory results.  2. Moderate obesity.  3. History of mitral valve prolapse.  4. History of irritable bowel syndrome.   RECOMMENDATIONS:  Refer her to Dr. Jaclynn Guarneri for his consultation and  discuss with him further therapies for Jennifer [name].  She is a super nice lady  who I really would like to see helped with this condition.  It certainly  could be bothering her on the basis of some stress as well, because she  does have a great deal of underlying stress, but it is hard to tell how  much this might be a factor.  I will discuss this as well with Dr.  Johna Sheriff.     Ulyess Mort, MD  Electronically Signed    SML/MedQ  DD: 05/30/2006   DT: 05/31/2006  Job #: [id number]   cc:   Lorne Skeens. Hoxworth, M.D.

## 2010-09-09 NOTE — Assessment & Plan Note (Signed)
[facility]                             PRIMARY CARE OFFICE NOTE   NAME:Piscitello, SHARLEEN SZCZESNY                         MRN:          [id number]  DATE:01/31/2006                            DOB:          [birth year]    [name] is a 60 year old woman who presents for annual physical  examination and evaluation.  She was last seen December 12, 2005, to discuss a  follow up CT scan which had revealed the patient to have three tiny  pulmonary nodules.  She had had these initially noted on a CT of the abdomen  with a small nodule in the right lobe.  A CT of the chest did reveal three  small nodules in the right lobe with recommended follow up.  She has been  asymptomatic.   GI.  The patient has a history of significant hiatal hernia.  Has undergone  Nissen fundoplication x3, initially with laparoscopic procedure, last  procedure being open.  She has had significant problems since her last  surgery with dysphagia as well as what sounds like irritation or erosion.  The patient reports it is very painful to swallow and often times she does  not get food down.  She has had to regurgitate food.  Because of this, her  diet is suboptimal, particularly in regards to calories.  She was asked to  follow with Dr. Victorino Dike on July 24, 2005.   Patient complains of having a significant problem with muscle cramps.  Oddly  enough, she reports it was relieved with ketchup at her husband's  suggestion.   PAST MEDICAL HISTORY:  Surgical:  1. Hysterectomy.  2. Appendectomy.  3. Excision of a large scalp lesion remotely.  4. Notations are in the chart on several occasions for some type of      neurosurgery, although I cannot locate records to better define this.  5. Laparoscopic Nissen fundoplication in 1990.  6. Standard Nissen fundoplication in 2001.  Patient had a third surgery,      Nissen fundoplication, in [place], subsequent to that.   Medical Illness:  1. The  patient has had history of the usual childhood disease.  2. IBS.  3. GERD.  4. Headache.  5. Internal left knee derangement after a fall, treated with injection      therapy only.   CHART REVIEW:  Last EGD May 30, 2005, with esophageal stricture  requiring dilatation.  The patient has had a nuclear stress study November 01, 2004, which revealed no evidence of __________ or ischemia, normal wall  motion was noted, normal ejection fraction was noted.  Last 2D  echocardiogram October 31, 2004, with a normal EF of 60% with no significant  abnormalities noted.  No valvular abnormalities are reported.  Last CT of  the chest was November 24, 2005, with nodules as reported previously.   REVIEW OF SYSTEMS:  The patient has had no fevers, sweats or chills.  She  does have night sweats.  She has had an eye exam in July of 2007.  No ENT.  CARDIOVASCULAR REPORT:  The patient does have mild dyspnea on exertion which  she attributes to poor conditioning.  GI:  As above.  GU:  No problems  except for frequency and urgency with nocturia x2-3.  No MSK or dermatologic  problems reported.   CURRENT MEDICATIONS:  1. Aciphex 20 mg daily.  2. Multivitamin.   EXAMINATION:  Temperature was 97.9, blood pressure 140/89, pulse 81, weight  247.  GENERAL APPEARANCE:  This is a heavyset, well groomed, attractive woman in  no acute distress.  HEENT EXAM:  Normocephalic, atraumatic.  EACs and TMs were normal.  Oropharynx with native dentition in good repair.  No buccal or palate  lesions were noted.  Posterior pharynx was clear.  Conjunctiva and sclera  was clear.  Pupils equal, round, react to light and accommodation.  Funduscopic examination deferred to recent ophthalmic exam.  NECK:  Supple without thyromegaly.  No lymphadenopathy was noted in the  cervical or supraclavicular regions.  CHEST:  No CVA tenderness.  LUNGS:  Clear to auscultation and percussion.  BREAST EXAM:  The patient is status post reduction  mammoplasty with well  healed scars.  She had fibrocystic type changes in both breasts with some  tenderness but no fixed mass lesion or abnormality was otherwise noted.  ABDOMEN:  Soft, no guarding, no rebound, no organosplenomegaly was noted.  PELVIC EXAM:  Deferred, with patient being status post hysterectomy.  EXTREMITIES:  Without clubbing, cyanosis and edema, no deformities were  noted, she has good range of motion of both knees, skin was clear.   ASSESSMENT AND PLAN:  1. Weight management.  Discussed this in detail with the patient.  I have      requested that she keep a food diary and determine her daily calorie      intake.  She should be on a 1200-1400 calorie diet for weight loss.      Goal was set for a weight loss of 1 to 1.5 pounds per month.  2. Gastrointestinal.  Patient with recurrent and ongoing symptoms in      regards to dysphagia.  She is very reluctant to consider additional      surgery.  She does have an appointment to see Dr. Victorino Dike in the      near future and may be a candidate for repeat dilation.  3. Genitourinary.  The patient with urinary frequency suggestive of      irritable bladder.   PLAN:  1. The patient is given samples of VESIcare 5 mg to take daily and if she      gets a good response a prescription is provided.  2. Health maintenance.  Patient does need mammography.  Patient will need      to have a CT of the chest for follow up of nodules in February of 2008.   In summary, this is a very pleasant woman who seems to be medically stable  with problems outlined above.  She is to call me to have a CT of the chest  scheduled after the turn of the year.  She is to follow up with Dr. Victorino Dike as instructed.            ______________________________  Rosalyn Gess. Norins, MD      MEN/MedQ  DD:  02/01/2006  DT:  02/02/2006  Job #:  [id number]   cc:   Normajean Glasgow

## 2010-10-25 ENCOUNTER — Other Ambulatory Visit: Payer: Self-pay | Admitting: Internal Medicine

## 2010-10-25 ENCOUNTER — Other Ambulatory Visit: Payer: Self-pay | Admitting: Endocrinology

## 2010-11-29 ENCOUNTER — Other Ambulatory Visit: Payer: Self-pay | Admitting: Internal Medicine

## 2010-11-30 NOTE — Telephone Encounter (Signed)
Medication refilled

## 2010-12-09 ENCOUNTER — Other Ambulatory Visit: Payer: Self-pay | Admitting: Internal Medicine

## 2010-12-13 ENCOUNTER — Encounter: Payer: Self-pay | Admitting: Adult Health

## 2010-12-13 ENCOUNTER — Ambulatory Visit (INDEPENDENT_AMBULATORY_CARE_PROVIDER_SITE_OTHER): Payer: Federal, State, Local not specified - PPO | Admitting: Adult Health

## 2010-12-13 ENCOUNTER — Ambulatory Visit: Payer: Federal, State, Local not specified - PPO | Admitting: Pulmonary Disease

## 2010-12-13 VITALS — BP 130/78 | HR 81 | Temp 98.4°F | Ht 68.5 in | Wt 246.0 lb

## 2010-12-13 DIAGNOSIS — J209 Acute bronchitis, unspecified: Secondary | ICD-10-CM

## 2010-12-13 MED ORDER — PREDNISONE 10 MG PO TABS: ORAL_TABLET | ORAL | Status: AC

## 2010-12-13 MED ORDER — HYDROCODONE-HOMATROPINE 5-1.5 MG/5ML PO SYRP: 5.0000 mL | ORAL_SOLUTION | Freq: Four times a day (QID) | ORAL | Status: AC | PRN

## 2010-12-13 MED ORDER — ALBUTEROL SULFATE (2.5 MG/3ML) 0.083% IN NEBU
2.5000 mg | INHALATION_SOLUTION | Freq: Once | RESPIRATORY_TRACT | Status: AC
  Administered 2010-12-13: 2.5 mg via RESPIRATORY_TRACT

## 2010-12-13 NOTE — Patient Instructions (Signed)
Finish Levaquin  Mucinex DM Twice daily  As needed  Cough/congestion  Fluids and rest  Prednisone taper over next week.  Hydromet 1-2 tsp every 4- 6 hrs As needed   follow up Dr. Vassie Loll  In 6 weeks and As needed   Please contact office for sooner follow up if symptoms do not improve or worsen or seek emergency care

## 2010-12-13 NOTE — Progress Notes (Signed)
Subjective:    Patient ID: Jennifer [name], female    DOB: 1950-12-20, 60 y.o.   MRN: [id number]  HPI 59/F, never smoker with positional obstructive sleep apnea.  PSG in 10/04 when she weighed 210 lbs showed a TST of 250 mins incl 37 mins of REM, RDI was 15/h with lowest desaturation of 84% & modearte snoring, predominantly REM related events.  PSG april'11 (256 lbs )>>Mild-to-moderate obstructive sleep apnea - RDI was 17/h, non supine AHI was only 1.7/h   September 28, 2009- husband states less nsoring with positional device , but she is still tired.  potrbale study (using positional device) shows AHI of 10/h with desaturation index 12/h .   January 25, 2010  psg (250 lbs) - cpap titrated to 9 cm, smal full face mask  agreeable to using CPAP, felt refreshed the mornign after.  Able to use CPAP, download 8/23- 01/11/10 >> no residual events, good compliance, minimal leak   June 22, 2010  download on 9 cm, AHI 4.5/h, good usage,leak +, would like cpap pillow , c/o crick in her neck  Remains fatigued  c/o cough x 1 month, minimal white mucus, denies GERD - has Nisen's x 3 - [place]/ [place]  Has some dysphagia   12/13/2010 Acute OV  Pt presents for an acute office visit. Complains of cough and congestion for 10 days. Went to [facility] on 8/19 and was dx with bronchitis. Tx  with Levofloxacin 500, and Benzonatate 200mg . c/o wheezing and severe cough .  C/o unable to use CPAP due to nasal congestion and cough. Tessalon not helping. Iniitally started with nasal congestion  And drained down throat into lungs.  OTC not helping.      Past Medical History:    ESOPHAGEAL STENOSIS/STRICTURE AFTER FUNDOPLIACTION REDO  SUBJECTIVE TINNITUS  MITRAL VALVE PROLAPSE  IRRITABLE BOWEL SYNDROME  GERD  COSTOCHONDRITIS  [facility]A-mild sleep study '04  Pulmonary embolus 04/2008  Chronic Chest pain  Chronic dysphagia   Social History:   [employer] graduate - technical school  married '70- 2 years divorced; married  '86 - 2.5 years divorced; married '90 - 1 year divorced; married '00  1 son - '54  Sister - died @ 23 massive MI  work: [employer]  Alcohol Use - no  Patient has never smoked.  Patient does not get regular exercise.    Review of Systems Constitutional:   No  weight loss, night sweats,  Fevers, chills, fatigue, or  lassitude.  HEENT:   No headaches,  Difficulty swallowing,  Tooth/dental problems, or  Sore throat,                No sneezing, itching, ear ache,   CV:  No chest pain,  Orthopnea, PND, swelling in lower extremities, anasarca, dizziness, palpitations, syncope.   GI  No heartburn, indigestion, abdominal pain, nausea, vomiting, diarrhea, change in bowel habits, loss of appetite, bloody stools.   Resp: No coughing up of blood.  Marland Kitchen  No chest wall deformity  Skin: no rash or lesions.  GU: no dysuria, change in color of urine, no urgency or frequency.  No flank pain, no hematuria   MS:  No joint pain or swelling.  No decreased range of motion.  No back pain.  Psych:  No change in mood or affect. No depression or anxiety.  No memory loss.         Objective:   Physical Exam GEN: A/Ox3; pleasant , NAD, well nourished   HEENT:  [place]/AT,  EACs-clear, TMs-wnl, NOSE-clear, THROAT-clear, no lesions, no postnasal drip or exudate noted.   NECK:  Supple w/ fair ROM; no JVD; normal carotid impulses w/o bruits; no thyromegaly or nodules palpated; no lymphadenopathy.  RESP  Coarse BS w/ few exp wheezes no accessory muscle use, no dullness to percussion, upper airway psuedowheezes   CARD:  RRR, no m/r/g  , no peripheral edema, pulses intact, no cyanosis or clubbing.  GI:   Soft & nt; nml bowel sounds; no organomegaly or masses detected.  Musco: Warm bil, no deformities or joint swelling noted.   Neuro: alert, no focal deficits noted.    Skin: Warm, no lesions or rashes         Assessment & Plan:

## 2010-12-13 NOTE — Progress Notes (Signed)
Addended by: Julaine Hua on: 12/13/2010 06:34 PM   Modules accepted: Orders

## 2010-12-13 NOTE — Assessment & Plan Note (Signed)
Slow to resolve flare  Plan:  Albuterol neb in office  Finish Levaquin  Mucinex DM Twice daily  As needed  Cough/congestion  Fluids and rest  Prednisone taper over next week.  Hydromet 1-2 tsp every 4- 6 hrs As needed   follow up Dr. Vassie Loll  In 6 weeks and As needed   Please contact office for sooner follow up if symptoms do not improve or worsen or seek emergency care

## 2010-12-14 ENCOUNTER — Telehealth: Payer: Self-pay | Admitting: Pulmonary Disease

## 2010-12-14 NOTE — Telephone Encounter (Signed)
Download 5/2-7/17/12 on 9 cm >> good compliance, few residual events, no leak No changes

## 2010-12-20 ENCOUNTER — Encounter: Payer: Self-pay | Admitting: Pulmonary Disease

## 2010-12-20 ENCOUNTER — Other Ambulatory Visit: Payer: Self-pay | Admitting: Internal Medicine

## 2010-12-21 NOTE — Telephone Encounter (Signed)
Left message with family member for pt to return call.

## 2010-12-21 NOTE — Telephone Encounter (Signed)
Ok for refill x 5 

## 2010-12-23 NOTE — Telephone Encounter (Signed)
LMTCBx2. Jennifer Castillo, CMA  

## 2010-12-27 NOTE — Telephone Encounter (Signed)
I informed pt of RA's findings and recommendations. Pt verbalized understanding  

## 2010-12-30 ENCOUNTER — Encounter: Payer: Self-pay | Admitting: Internal Medicine

## 2010-12-30 ENCOUNTER — Encounter: Payer: Self-pay | Admitting: *Deleted

## 2010-12-30 ENCOUNTER — Other Ambulatory Visit: Payer: Self-pay | Admitting: Internal Medicine

## 2010-12-30 ENCOUNTER — Ambulatory Visit (INDEPENDENT_AMBULATORY_CARE_PROVIDER_SITE_OTHER): Payer: Federal, State, Local not specified - PPO | Admitting: Internal Medicine

## 2010-12-30 ENCOUNTER — Telehealth: Payer: Self-pay | Admitting: *Deleted

## 2010-12-30 DIAGNOSIS — R109 Unspecified abdominal pain: Secondary | ICD-10-CM

## 2010-12-30 MED ORDER — ONDANSETRON HCL 4 MG PO TABS: 4.0000 mg | ORAL_TABLET | Freq: Three times a day (TID) | ORAL | Status: AC | PRN

## 2010-12-30 MED ORDER — HYDROMORPHONE HCL 2 MG PO TABS: 2.0000 mg | ORAL_TABLET | Freq: Two times a day (BID) | ORAL | Status: DC | PRN

## 2010-12-30 MED ORDER — TRAMADOL HCL 50 MG PO TABS: ORAL_TABLET | ORAL | Status: DC

## 2010-12-30 NOTE — Telephone Encounter (Signed)
Spoke w/pt - she c/o nausea and right sided "kidney" pain x 1 day, worse this afternoon. She is leaving work now and coming into the office.

## 2010-12-30 NOTE — Patient Instructions (Signed)
Abdominal pain - no sure of the diagnosis but this may be GI related: stone in the common bile duct vs pancreatitis. Plna - lab tonight: amylase, lipase and liver functions drawn at [facility]; continue all your home medications; take zofran 4 mg every 8 hours as needed for nausea; take tramadol 50 mg 1 or 2 tablets every 8 hours for pain. Will call with any abnormal lab results. If you cannot get relief you may need to go to the ED.

## 2010-12-30 NOTE — Telephone Encounter (Signed)
Pt seen in office now

## 2010-12-31 LAB — HEPATIC FUNCTION PANEL
Albumin: 4 g/dL (ref 3.5–5.2)
Total Bilirubin: 0.3 mg/dL (ref 0.3–1.2)
Total Protein: 7.2 g/dL (ref 6.0–8.3)

## 2010-12-31 LAB — AMYLASE: Amylase: 56 U/L (ref 0–105)

## 2011-01-01 NOTE — Progress Notes (Signed)
Subjective:    Patient ID: Jennifer [name], female    DOB: [birth year], 60 y.o.   MRN: [id number]  HPI Jennifer [name] presents as a late acute visit with c/o severe pain int he right back and also in the right upper abdomen. This was of sudden onset. She has significant nausea but no emesis ( vomiting is limited after fundoplication x 2. She has had no fever, chills, change in stools, inability to eat or drink. She has been able to work.  Past Medical History  Diagnosis Date  . Esophageal stenosis     Stricture after fundoplication REDO  . Tinnitus     Subjective  . Mitral valve prolapse   . Irritable bowel syndrome   . GERD (gastroesophageal reflux disease)   . Costochondritis   . OSA (obstructive sleep apnea)     Mild - Sleep study 2004  . Pulmonary embolus 04/2008  . Chronic chest pain   . Dysphasia     Chronic   Past Surgical History  Procedure Date  . Brain surgery 1995    Stem surgery, Arnold Chiari Malformation  . Breast reduction surgery   . Appendectomy   . Abdominal hysterectomy   . Nissen fundoplication     X 3 lab, open x 2 last with mesh  . Cholecystectomy 2008   Family History  Problem Relation Age of Onset  . Diabetes Mother   . Rheum arthritis Mother   . Hypertension Mother   . Hypertension Father   . Rheum arthritis Sister   . Hypertension Sister   . Colon cancer Maternal Grandmother   . Rheum arthritis Sister   . Rheum arthritis Sister   . Rheum arthritis Sister    History   Social History  . Marital Status: Married    Spouse Name: N/A    Number of Children: N/A  . Years of Education: N/A   Occupational History  . [employer]    Social History Main Topics  . Smoking status: Never Smoker   . Smokeless tobacco: Never Used  . Alcohol Use: No  . Drug Use: Not on file  . Sexually Active: Not on file   Other Topics Concern  . Not on file   Social History Narrative   [employer] Graduate - Technical schoolMarried '70 - 2 years divorced; married '86 - 2.5  years divorced; married '90 - 1 year divorced; married '001 son - '72Sister - Died @ 85 Massive MIRegular Exercise -  NO       Review of Systems  Constitutional: Negative for fever, chills, activity change and appetite change.  HENT: Negative for congestion and neck stiffness.   Eyes: Negative.   Respiratory: Positive for shortness of breath. Negative for chest tightness.   Cardiovascular: Positive for chest pain.  Gastrointestinal: Positive for nausea and abdominal pain. Negative for vomiting, diarrhea, blood in stool, abdominal distention and anal bleeding.  Genitourinary: Negative.   Musculoskeletal: Positive for back pain.  Neurological: Positive for headaches. Negative for syncope, speech difficulty and weakness.  Hematological: Negative.        Objective:   Physical Exam Vitals noted - stable, afebrile Gen'l - generously proportioned AA woman who is uncomfortable but not in acute distress HEENT - C&S clear, PERRLA Chest - CTAP, O2 sat normal, no increased work of breathing Cor - RRR Abdomen - obese,,BS + x 4, no hepatomegaly, tender to palpation in the RUQ and epigastrum, no guarding or rebound tenderness.       Assessment &  Plan:  Abdominal pain - s/p cholecystectomy. Location and severity suggestive pancreatitis. Doubt peptic acid disease. Symptoms carefully reivewed and discussed with the patient and this unlike prior symptoms of PE.  Plan - continue her present pain medication           Sent to [facility] for lab: amylase, lipase, LFT           No dietary restrictions.   Addendum - LFTs, amylase, lipase - normal  Plan - will follow-up with patient Sept 10th

## 2011-01-03 ENCOUNTER — Telehealth: Payer: Self-pay | Admitting: *Deleted

## 2011-01-03 NOTE — Telephone Encounter (Signed)
Pt called back with update on her symptoms, she states she is still having the nagging abdominal pain pain scale 4/10. She does say she is extremely nauseated and does not have an appetite. But for the most part her symptoms are the same

## 2011-01-03 NOTE — Telephone Encounter (Signed)
Can see Thursday. Her labs were all normal

## 2011-01-03 NOTE — Telephone Encounter (Signed)
[facility] called & req RF of Restoril. OK?

## 2011-01-04 NOTE — Telephone Encounter (Signed)
Informed pt she has appointment with Dr Debby Bud on Thursday at 4 pm

## 2011-01-05 ENCOUNTER — Encounter: Payer: Self-pay | Admitting: Internal Medicine

## 2011-01-05 ENCOUNTER — Ambulatory Visit (INDEPENDENT_AMBULATORY_CARE_PROVIDER_SITE_OTHER): Payer: Federal, State, Local not specified - PPO | Admitting: Internal Medicine

## 2011-01-05 VITALS — BP 110/64 | HR 85 | Temp 98.5°F | Wt 251.0 lb

## 2011-01-05 DIAGNOSIS — R1011 Right upper quadrant pain: Secondary | ICD-10-CM

## 2011-01-08 NOTE — Progress Notes (Signed)
  Subjective:    Patient ID: Jennifer [name], female    DOB: [birth year], 60 y.o.   MRN: [id number]  HPI Jennifer [name] was seen Sept 7th for evaluation of acute abdominal pain RUQ and epigastrum. Her labs at that time including amylase, lipase and LFTs were normal. She did not require any acute interventiion or treatment. She reports today that her symptoms are much improved.  I have reviewed the patient's medical history in detail and updated the computerized patient record.    Review of Systems System review is negative for any constitutional, cardiac, pulmonary, GI or neuro symptoms or complaints     Objective:   Physical Exam Vitals reviewed Gen'l - WNWD attractive heavy-set AA woman in NAD HEENT - C&S clear Cor - RRR  Resp - normal        Assessment & Plan:  Abdominal pain - no cause identified - symptoms improved.

## 2011-01-11 ENCOUNTER — Other Ambulatory Visit: Payer: Self-pay | Admitting: *Deleted

## 2011-01-11 MED ORDER — TEMAZEPAM 30 MG PO CAPS: 30.0000 mg | ORAL_CAPSULE | Freq: Every evening | ORAL | Status: DC | PRN

## 2011-01-17 ENCOUNTER — Ambulatory Visit (INDEPENDENT_AMBULATORY_CARE_PROVIDER_SITE_OTHER): Payer: Federal, State, Local not specified - PPO | Admitting: Pulmonary Disease

## 2011-01-17 ENCOUNTER — Encounter: Payer: Self-pay | Admitting: Pulmonary Disease

## 2011-01-17 DIAGNOSIS — J209 Acute bronchitis, unspecified: Secondary | ICD-10-CM

## 2011-01-17 DIAGNOSIS — G4710 Hypersomnia, unspecified: Secondary | ICD-10-CM

## 2011-01-17 DIAGNOSIS — Z23 Encounter for immunization: Secondary | ICD-10-CM

## 2011-01-17 DIAGNOSIS — G4730 Sleep apnea, unspecified: Secondary | ICD-10-CM

## 2011-01-17 NOTE — Assessment & Plan Note (Signed)
Compliant with cpap 9 cm Get new supplies - incl humidifier Weight loss encouraged, compliance with goal of at least 4-6 hrs every night is the expectation. Advised against medications with sedative side effects Cautioned against driving when sleepy - understanding that sleepiness will vary on a day to day basis

## 2011-01-17 NOTE — Progress Notes (Signed)
  Subjective:    Patient ID: Jennifer [name], female    DOB: 09/21/1950, 60 y.o.   MRN: [id number]  HPI  60/F, never smoker with positional obstructive sleep apnea.  PSG in 10/04 when she weighed 210 lbs showed a TST of 250 mins incl 37 mins of REM, RDI was 15/h with lowest desaturation of 84% & modearte snoring, predominantly REM related events.  PSG april'11 (256 lbs )>>Mild-to-moderate obstructive sleep apnea - RDI was 17/h, non supine AHI was only 1.7/h  September 28, 2009- husband states less nsoring with positional device , but she is still tired.  potrbale study (using positional device) shows AHI of 10/h with desaturation index 12/h .  psg (250 lbs) aug'11 - cpap titrated to 9 cm, smal full face mask  download 8/23- 01/11/10 >> no residual events, good compliance, minimal leak  Mild intermittent cough attributed to GERD - has Nisen's x 3 - Gessner/ [place]  Has some dysphagia    01/17/2011 Bronchitis has resolved. Wonders if new humidifer will help - she has trouble cleaning her present one Compliant with CPAP, full face mask. Pressure ok   Review of Systems Patient denies significant dyspnea,cough, hemoptysis,  chest pain, palpitations, pedal edema, orthopnea, paroxysmal nocturnal dyspnea, lightheadedness, nausea, vomiting, abdominal or  leg pains      Objective:   Physical Exam Gen. Pleasant, obese, in no distress ENT - no lesions, no post nasal drip, enlarged turbinates Neck: No JVD, no thyromegaly, no carotid bruits Lungs: no use of accessory muscles, no dullness to percussion, clear without rales or rhonchi  Cardiovascular: Rhythm regular, heart sounds  normal, no murmurs or gallops, no peripheral edema Musculoskeletal: No deformities, no cyanosis or clubbing         Assessment & Plan:

## 2011-01-17 NOTE — Assessment & Plan Note (Signed)
Resolved Flu shot today

## 2011-01-17 NOTE — Patient Instructions (Signed)
Flu shot Go ahead & ask for new supplies

## 2011-01-17 NOTE — Progress Notes (Signed)
Addended by: Julaine Hua on: 01/17/2011 12:13 PM   Modules accepted: Orders

## 2011-02-03 LAB — COMPREHENSIVE METABOLIC PANEL
AST: 21
Albumin: 3.7
Alkaline Phosphatase: 58
BUN: 9
CO2: 27
Chloride: 105
Creatinine, Ser: 0.94
GFR calc Af Amer: >60
GFR calc non Af Amer: >60
Potassium: 3.7
Total Bilirubin: 0.9

## 2011-02-03 LAB — URINALYSIS, ROUTINE W REFLEX MICROSCOPIC
Glucose, UA: NEGATIVE
Hgb urine dipstick: NEGATIVE
Specific Gravity, Urine: 1.02

## 2011-02-03 LAB — CBC
HCT: 37.9
MCV: 82.9
Platelets: 240
RBC: 4.57
WBC: 4.1

## 2011-02-03 LAB — DIFFERENTIAL
Basophils Absolute: 0
Basophils Relative: 1
Eosinophils Absolute: 0.1
Eosinophils Relative: 3
Lymphocytes Relative: 43
Monocytes Absolute: 0.4

## 2011-03-02 ENCOUNTER — Other Ambulatory Visit: Payer: Self-pay | Admitting: Internal Medicine

## 2011-06-07 ENCOUNTER — Ambulatory Visit: Payer: Federal, State, Local not specified - PPO | Admitting: Internal Medicine

## 2011-07-03 ENCOUNTER — Other Ambulatory Visit: Payer: Self-pay | Admitting: Internal Medicine

## 2011-08-29 ENCOUNTER — Other Ambulatory Visit: Payer: Self-pay | Admitting: Internal Medicine

## 2011-10-06 ENCOUNTER — Other Ambulatory Visit: Payer: Self-pay | Admitting: Internal Medicine

## 2011-10-06 DIAGNOSIS — Z9889 Other specified postprocedural states: Secondary | ICD-10-CM

## 2011-10-06 DIAGNOSIS — Z1231 Encounter for screening mammogram for malignant neoplasm of breast: Secondary | ICD-10-CM

## 2011-10-10 ENCOUNTER — Ambulatory Visit
Admission: RE | Admit: 2011-10-10 | Discharge: 2011-10-10 | Disposition: A | Discharge status: Home / Self Care | Payer: Federal, State, Local not specified - PPO | Source: Ambulatory Visit | Attending: Internal Medicine | Admitting: Internal Medicine

## 2011-10-10 DIAGNOSIS — Z9889 Other specified postprocedural states: Secondary | ICD-10-CM

## 2011-10-10 DIAGNOSIS — Z1231 Encounter for screening mammogram for malignant neoplasm of breast: Secondary | ICD-10-CM

## 2011-10-11 ENCOUNTER — Other Ambulatory Visit: Payer: Self-pay | Admitting: Otolaryngology

## 2011-10-11 DIAGNOSIS — H903 Sensorineural hearing loss, bilateral: Secondary | ICD-10-CM

## 2011-10-11 DIAGNOSIS — H9319 Tinnitus, unspecified ear: Secondary | ICD-10-CM

## 2011-10-11 DIAGNOSIS — H905 Unspecified sensorineural hearing loss: Secondary | ICD-10-CM

## 2011-10-19 ENCOUNTER — Ambulatory Visit
Admission: RE | Admit: 2011-10-19 | Discharge: 2011-10-19 | Disposition: A | Discharge status: Home / Self Care | Payer: Federal, State, Local not specified - PPO | Source: Ambulatory Visit | Attending: Otolaryngology | Admitting: Otolaryngology

## 2011-10-19 DIAGNOSIS — H903 Sensorineural hearing loss, bilateral: Secondary | ICD-10-CM

## 2011-10-19 DIAGNOSIS — H9319 Tinnitus, unspecified ear: Secondary | ICD-10-CM

## 2011-10-19 DIAGNOSIS — H905 Unspecified sensorineural hearing loss: Secondary | ICD-10-CM

## 2011-10-19 MED ORDER — GADOBENATE DIMEGLUMINE 529 MG/ML IV SOLN
20.0000 mL | Freq: Once | INTRAVENOUS | Status: AC | PRN
  Administered 2011-10-19: 20 mL via INTRAVENOUS

## 2011-11-13 ENCOUNTER — Ambulatory Visit: Payer: Federal, State, Local not specified - PPO | Admitting: Internal Medicine

## 2011-11-22 ENCOUNTER — Other Ambulatory Visit: Payer: Self-pay | Admitting: Internal Medicine

## 2011-11-23 ENCOUNTER — Other Ambulatory Visit: Payer: Self-pay | Admitting: *Deleted

## 2011-11-23 MED ORDER — ALPRAZOLAM 0.25 MG PO TABS: 0.2500 mg | ORAL_TABLET | Freq: Four times a day (QID) | ORAL | Status: DC | PRN

## 2011-11-23 NOTE — Telephone Encounter (Signed)
Rx called [facility]

## 2011-11-23 NOTE — Telephone Encounter (Signed)
pateint request refill on alprazolam. Last OV 12/2010

## 2011-11-23 NOTE — Telephone Encounter (Signed)
pateint request refill on alprazolam.

## 2011-11-23 NOTE — Telephone Encounter (Signed)
Ok x 5 

## 2011-12-06 ENCOUNTER — Other Ambulatory Visit: Payer: Self-pay | Admitting: Internal Medicine

## 2011-12-07 ENCOUNTER — Ambulatory Visit (INDEPENDENT_AMBULATORY_CARE_PROVIDER_SITE_OTHER): Payer: Self-pay | Admitting: General Surgery

## 2012-01-08 ENCOUNTER — Encounter: Payer: Self-pay | Admitting: Internal Medicine

## 2012-01-08 ENCOUNTER — Ambulatory Visit (INDEPENDENT_AMBULATORY_CARE_PROVIDER_SITE_OTHER): Payer: Federal, State, Local not specified - PPO | Admitting: Internal Medicine

## 2012-01-08 ENCOUNTER — Other Ambulatory Visit (INDEPENDENT_AMBULATORY_CARE_PROVIDER_SITE_OTHER): Payer: Federal, State, Local not specified - PPO

## 2012-01-08 VITALS — BP 122/84 | HR 72 | Temp 98.0°F | Resp 16 | Wt 237.0 lb

## 2012-01-08 DIAGNOSIS — Z23 Encounter for immunization: Secondary | ICD-10-CM

## 2012-01-08 DIAGNOSIS — R109 Unspecified abdominal pain: Secondary | ICD-10-CM

## 2012-01-08 LAB — HEPATIC FUNCTION PANEL
ALT: 17 U/L (ref 0–35)
AST: 21 U/L (ref 0–37)
Albumin: 3.9 g/dL (ref 3.5–5.2)
Alkaline Phosphatase: 53 U/L (ref 39–117)
Bilirubin, Direct: 0 mg/dL (ref 0.0–0.3)
Total Bilirubin: 0.5 mg/dL (ref 0.3–1.2)
Total Protein: 7.9 g/dL (ref 6.0–8.3)

## 2012-01-08 LAB — AMYLASE: Amylase: 81 U/L (ref 27–131)

## 2012-01-08 LAB — LIPASE: Lipase: 15 U/L (ref 11.0–59.0)

## 2012-01-08 MED ORDER — TRAMADOL HCL 50 MG PO TABS: ORAL_TABLET | ORAL | Status: DC

## 2012-01-08 MED ORDER — ONDANSETRON HCL 4 MG PO TABS: 4.0000 mg | ORAL_TABLET | Freq: Three times a day (TID) | ORAL | Status: DC | PRN

## 2012-01-08 MED ORDER — LIDOCAINE 5 % EX PTCH: 1.0000 | MEDICATED_PATCH | CUTANEOUS | Status: DC

## 2012-01-08 MED ORDER — HYOSCYAMINE SULFATE 0.125 MG SL SUBL: 0.1250 mg | SUBLINGUAL_TABLET | SUBLINGUAL | Status: DC | PRN

## 2012-01-08 NOTE — Patient Instructions (Addendum)
1. Right side pain - For this pain, we will get labs to check for pancreatitis. We will also check labs on your liver. These results will be mailed to you. We will send you to [facility] to have a ultrasound of your abdomen to check your kidneys. If we do not get any findings on any of these tests, the next step is an MRI of your abdomen.

## 2012-01-08 NOTE — Progress Notes (Signed)
Subjective:     Patient ID: Jennifer [name], female   DOB: 08-Dec-1950, 61 y.o.   MRN: [id number]  HPI Comments: Jennifer [name] is a 61 yo female with a history of PE in 2011 and HTN who has come into clinic today complaining of back pain. When I asked her to located the pain she pointed to her right flank. This pain has waxed and waned since 2011 and is described as a deep, dull throbbing pain. The pain is accompanied by nausea. She states that the pain feels like it is in her kidney. Her most recent episode began last Wednesday and is getting worse. She has been using lidocaine patches over the painful area and ibuprofen. These patches and ibuprofen provide some relief of the pain. The pain is exacerbated by deep inspiration and is unaffected by expiration. She states that she was sick for 1 day last week. She reports that she has had a loss of appetite without weight loss. She has also had urinary urgency that has been getting worse since 5 years ago. She also reports no change in her urine color or bowel movements.  The patient requested a flu shot.  Past Medical History  Diagnosis Date  . Esophageal stenosis     Stricture after fundoplication REDO  . Tinnitus     Subjective  . Mitral valve prolapse   . Irritable bowel syndrome   . GERD (gastroesophageal reflux disease)   . Costochondritis   . OSA (obstructive sleep apnea)     Mild - Sleep study 2004  . Pulmonary embolus 04/2008  . Chronic chest pain   . Dysphasia     Chronic   Past Surgical History  Procedure Date  . Brain surgery 1995    Stem surgery, Arnold Chiari Malformation  . Breast reduction surgery   . Appendectomy   . Abdominal hysterectomy   . Nissen fundoplication     X 3 lab, open x 2 last with mesh  . Cholecystectomy 2008   Family History  Problem Relation Age of Onset  . Diabetes Mother   . Rheum arthritis Mother   . Hypertension Mother   . Hypertension Father   . Rheum arthritis Sister   . Hypertension Sister   .  Colon cancer Maternal Grandmother   . Rheum arthritis Sister   . Rheum arthritis Sister   . Rheum arthritis Sister    History   Social History  . Marital Status: Married    Spouse Name: N/A    Number of Children: N/A  . Years of Education: N/A   Occupational History  . [employer]    Social History Main Topics  . Smoking status: Never Smoker   . Smokeless tobacco: Never Used  . Alcohol Use: No  . Drug Use: Not on file  . Sexually Active: Not on file   Other Topics Concern  . Not on file   Social History Narrative   [employer] Graduate - Technical schoolMarried '70 - 2 years divorced; married '86 - 2.5 years divorced; married '90 - 1 year divorced; married '001 son - '72Sister - Died @ 33 Massive MIRegular Exercise -  NO    Current Outpatient Prescriptions on File Prior to Visit  Medication Sig Dispense Refill  . ACIPHEX 20 MG tablet TAKE ONE TABLET BY MOUTH TWICE DAILY  60 each  5  . ALPRAZolam (XANAX) 0.25 MG tablet Take 1 tablet (0.25 mg total) by mouth every 6 (six) hours as needed.  30 tablet  5  . aspirin 325 MG tablet Take 325 mg by mouth daily.        Marland Kitchen FIRST-MOUTHWASH BLM SUSP SWISH AND SWALLOW ONE TO TWO TEASPOONSFUL  AS NEEDED FOR  SENSITIVE  MOUTH  237 mL  6  . meclizine (ANTIVERT) 12.5 MG tablet Take 12.5 mg by mouth 3 (three) times daily as needed. As needed for Dizziness       . temazepam (RESTORIL) 30 MG capsule TAKE ONE CAPSULE BY MOUTH AT BEDTIME  30 capsule  5  . triamterene-hydrochlorothiazide (MAXZIDE-25) 37.5-25 MG per tablet TAKE ONE-HALF TABLET BY MOUTH EVERY DAY  30 tablet  9  . DISCONTD: hyoscyamine (LEVSIN SL) 0.125 MG SL tablet Place 0.125 mg under the tongue every 4 (four) hours as needed.           Review of Systems  All other systems reviewed and are negative.       Objective:   Physical Exam  Nursing note and vitals reviewed. Constitutional: She is oriented to person, place, and time. She appears well-nourished. No distress.  HENT:    Mouth/Throat: Oropharynx is clear and moist. No oropharyngeal exudate.  Neck: Normal range of motion. Neck supple. No thyromegaly present.  Cardiovascular: Normal rate, regular rhythm and normal heart sounds.  Exam reveals no gallop and no friction rub.   No murmur heard.      2+ radial pulses bl.  Pulmonary/Chest: Effort normal and breath sounds normal. No respiratory distress. She has no wheezes. She has no rales.       Equal chest expansion  Abdominal: Soft. She exhibits no distension and no mass. There is tenderness (tenderness in right upper quadrant and right lower quadrant and left upper quadrant.). There is no guarding.  Musculoskeletal:       Arms:      No pain over spine on palpation.  Lymphadenopathy:    She has no cervical adenopathy.  Neurological: She is alert and oriented to person, place, and time.  Skin: She is not diaphoretic.  Psychiatric: She has a normal mood and affect. Her behavior is normal. Judgment and thought content normal.   Filed Vitals:   01/08/12 1155  BP: 122/84  Pulse: 72  Temp: 98 F (36.7 C)  Resp: 16        Assessment & Plan:     1. Right Flank Pain - This pain is dull and throbbing which can be indicative of visceral pain. This pain is likely to be renal pain given the location. It can also be liver pain given the tenderness of the RUQ and RLQ. Pain in LUQ may also be pancreatitis.  PLAN - Obtain serum amylase and lipase. Check LFTs. Obtain abdominal US. If these tests are  negative, an abdominal MRI is indicated as the next step. 2. Influenza Vaccine  PLAN - administered influenza vaccine.      Attending note: patient interviewed and briefly examined. Agree with assessment and plan of Mr. Kai Levins, MS III except that MRI may not be the appropriate next step if labs and U/S are negative. For persistent pain with negative labs will need to reexamine.

## 2012-01-12 ENCOUNTER — Other Ambulatory Visit: Payer: Self-pay | Admitting: *Deleted

## 2012-01-12 ENCOUNTER — Ambulatory Visit
Admission: RE | Admit: 2012-01-12 | Discharge: 2012-01-12 | Disposition: A | Discharge status: Home / Self Care | Payer: Federal, State, Local not specified - PPO | Source: Ambulatory Visit | Attending: Internal Medicine | Admitting: Internal Medicine

## 2012-01-12 DIAGNOSIS — R109 Unspecified abdominal pain: Secondary | ICD-10-CM

## 2012-01-12 MED ORDER — LIDOCAINE 5 % EX PTCH: 1.0000 | MEDICATED_PATCH | CUTANEOUS | Status: DC

## 2012-01-15 ENCOUNTER — Encounter: Payer: Self-pay | Admitting: Internal Medicine

## 2012-01-29 ENCOUNTER — Telehealth: Payer: Self-pay | Admitting: *Deleted

## 2012-01-29 DIAGNOSIS — R109 Unspecified abdominal pain: Secondary | ICD-10-CM

## 2012-01-29 NOTE — Telephone Encounter (Signed)
PATIENT CALLED TO ASK ABOUT SCHEDULE MRI. ULTRA SOUND ABDOMEN DONE AND STATES WAS NORMAL. PATIENT STILL WITH SIDE PAIN AND STOMACH PAIN WITH NAUSEA.  NOTIFY PATIENT OF SCHEDULED MRI AT PHONE # WORK [phone]  OR HOME NUMBER # [phone]. PLEASE ADVISE

## 2012-01-31 NOTE — Telephone Encounter (Signed)
Order sent to [facility] 

## 2012-01-31 NOTE — Telephone Encounter (Signed)
Patient notified that [facility] will notify her of MRI date and time per Dr. Debby Bud

## 2012-02-05 ENCOUNTER — Telehealth: Payer: Self-pay | Admitting: *Deleted

## 2012-02-05 NOTE — Telephone Encounter (Signed)
Sherry from [facility] called to reqeust order be put in for Patient MRI of abdomen to say with /with out contrast all in one order. Not sepatratly.            CB #

## 2012-02-08 ENCOUNTER — Other Ambulatory Visit: Payer: Self-pay | Admitting: Internal Medicine

## 2012-02-08 DIAGNOSIS — R109 Unspecified abdominal pain: Secondary | ICD-10-CM

## 2012-02-14 ENCOUNTER — Other Ambulatory Visit: Payer: Federal, State, Local not specified - PPO

## 2012-02-20 ENCOUNTER — Ambulatory Visit
Admission: RE | Admit: 2012-02-20 | Discharge: 2012-02-20 | Disposition: A | Discharge status: Home / Self Care | Payer: Federal, State, Local not specified - PPO | Source: Ambulatory Visit | Attending: Internal Medicine | Admitting: Internal Medicine

## 2012-02-20 DIAGNOSIS — R109 Unspecified abdominal pain: Secondary | ICD-10-CM

## 2012-02-20 MED ORDER — GADOBENATE DIMEGLUMINE 529 MG/ML IV SOLN
20.0000 mL | Freq: Once | INTRAVENOUS | Status: AC | PRN
  Administered 2012-02-20: 20 mL via INTRAVENOUS

## 2012-02-28 ENCOUNTER — Telehealth: Payer: Self-pay | Admitting: *Deleted

## 2012-02-28 NOTE — Telephone Encounter (Signed)
Patient called request results of MRI of abdomen that was done.

## 2012-02-29 ENCOUNTER — Telehealth: Payer: Self-pay | Admitting: *Deleted

## 2012-02-29 NOTE — Telephone Encounter (Signed)
Patient called to ask for results of MRI. Instructed results of MRI per Dr, Jennifer [name] is normal. Patient states is still with tender pain areas in abdomen. patient transferred to scheduler to make follow up appt. With Dr. Debby [name].

## 2012-02-29 NOTE — Telephone Encounter (Signed)
MRI abdomen 10/29 was 100% normal

## 2012-02-29 NOTE — Telephone Encounter (Signed)
Patient notified of normal MRI results.

## 2012-03-01 ENCOUNTER — Other Ambulatory Visit: Payer: Self-pay | Admitting: Orthopedic Surgery

## 2012-03-01 DIAGNOSIS — M25562 Pain in left knee: Secondary | ICD-10-CM

## 2012-03-06 ENCOUNTER — Other Ambulatory Visit (INDEPENDENT_AMBULATORY_CARE_PROVIDER_SITE_OTHER): Payer: Federal, State, Local not specified - PPO

## 2012-03-06 ENCOUNTER — Ambulatory Visit (INDEPENDENT_AMBULATORY_CARE_PROVIDER_SITE_OTHER): Payer: Federal, State, Local not specified - PPO | Admitting: Internal Medicine

## 2012-03-06 ENCOUNTER — Encounter: Payer: Self-pay | Admitting: Internal Medicine

## 2012-03-06 VITALS — BP 118/68 | HR 69 | Temp 97.1°F | Resp 12 | Wt 242.0 lb

## 2012-03-06 DIAGNOSIS — R109 Unspecified abdominal pain: Secondary | ICD-10-CM

## 2012-03-06 DIAGNOSIS — Z0000 Encounter for general adult medical examination without abnormal findings: Secondary | ICD-10-CM

## 2012-03-06 DIAGNOSIS — E663 Overweight: Secondary | ICD-10-CM

## 2012-03-06 DIAGNOSIS — G4710 Hypersomnia, unspecified: Secondary | ICD-10-CM

## 2012-03-06 DIAGNOSIS — Z833 Family history of diabetes mellitus: Secondary | ICD-10-CM

## 2012-03-06 DIAGNOSIS — K222 Esophageal obstruction: Secondary | ICD-10-CM

## 2012-03-06 LAB — COMPREHENSIVE METABOLIC PANEL
ALT: 17 U/L (ref 0–35)
BUN: 13 mg/dL (ref 6–23)
CO2: 30 mEq/L (ref 19–32)
Calcium: 9.1 mg/dL (ref 8.4–10.5)
Chloride: 100 mEq/L (ref 96–112)
Creatinine, Ser: 0.9 mg/dL (ref 0.4–1.2)
GFR: 82.36 mL/min (ref >60.00)
Glucose, Bld: 85 mg/dL (ref 70–99)
Total Bilirubin: 0.6 mg/dL (ref 0.3–1.2)

## 2012-03-06 LAB — LIPID PANEL
Cholesterol: 187 mg/dL (ref 0–200)
HDL: 73.4 mg/dL (ref >39.00)
Total CHOL/HDL Ratio: 3
Triglycerides: 69 mg/dL (ref 0.0–149.0)

## 2012-03-06 LAB — HEMOGLOBIN A1C: Hgb A1c MFr Bld: 6.2 % (ref 4.6–6.5)

## 2012-03-06 NOTE — Progress Notes (Signed)
  Subjective:    Patient ID: Jennifer [name], female    DOB: February 27, 1951, 61 y.o.   MRN: [id number]  HPI Jennifer [name] presents for follow up of right flank pain that radiates across the back. She had U/S that was normal. She had follow-up MRI that was normal: no structual problems, no enlarged lymph nodes, circulation was good. She is still having nausea but the pain has abated. She has seen Dr. Leone Payor in the past. Most recently she was seen in [place]. She continues to have some trouble with swallowing. She has had multiple EGDs and dilations. There is a concern about continued episodes of pain.   PMH, FamHx and SocHx reviewed for any changes and relevance.  Current Outpatient Prescriptions on File Prior to Visit  Medication Sig Dispense Refill  . ACIPHEX 20 MG tablet TAKE ONE TABLET BY MOUTH TWICE DAILY  60 each  5  . ALPRAZolam (XANAX) 0.25 MG tablet Take 1 tablet (0.25 mg total) by mouth every 6 (six) hours as needed.  30 tablet  5  . aspirin 325 MG tablet Take 325 mg by mouth daily.        Marland Kitchen FIRST-MOUTHWASH BLM SUSP SWISH AND SWALLOW ONE TO TWO TEASPOONSFUL  AS NEEDED FOR  SENSITIVE  MOUTH  237 mL  6  . hyoscyamine (LEVSIN SL) 0.125 MG SL tablet Place 1 tablet (0.125 mg total) under the tongue every 4 (four) hours as needed.  30 tablet  1  . lidocaine (LIDODERM) 5 % Place 1 patch onto the skin daily. Remove & Discard patch within 12 hours or as directed by MD  30 patch  1  . meclizine (ANTIVERT) 12.5 MG tablet Take 12.5 mg by mouth 3 (three) times daily as needed. As needed for Dizziness       . ondansetron (ZOFRAN) 4 MG tablet Take 1 tablet (4 mg total) by mouth every 8 (eight) hours as needed.  20 tablet  0  . temazepam (RESTORIL) 30 MG capsule TAKE ONE CAPSULE BY MOUTH AT BEDTIME  30 capsule  5  . traMADol (ULTRAM) 50 MG tablet Take 1 or 2 every 6 hours as needed for pain  120 tablet  0  . triamterene-hydrochlorothiazide (MAXZIDE-25) 37.5-25 MG per tablet TAKE ONE-HALF TABLET BY MOUTH EVERY  DAY  30 tablet  9      Review of Systems System review is negative for any constitutional, cardiac, pulmonary, GI or neuro symptoms or complaints other than as described in the HPI.     Objective:   Physical Exam Filed Vitals:   03/06/12 1304  BP: 118/68  Pulse: 69  Temp: 97.1 F (36.2 C)  Resp: 12   Wt Readings from Last 3 Encounters:  03/06/12 242 lb 0.6 oz (109.789 kg)  01/08/12 237 lb (107.502 kg)  01/17/11 250 lb (113.399 kg)   Gen's - WNWD well groomed and attractive AA woman in no distress. Her affect is positive HEENT- C&S clear Cor- 2+ radial RRR Pulm - normal respirations. Neuor - A&O x 3, cognition normal, normal gait.       Assessment & Plan:

## 2012-03-06 NOTE — Patient Instructions (Addendum)
Continued episodic flank pain but you are able to manage your usual activities. For this flank pain and for pain with swallowing I recommend you reestablish with Dr. Leone Payor.  Will refer you to Dr. Vassie Loll for annual follow up of sleep apnea  Lab today for sugar and cholesterol.

## 2012-03-07 ENCOUNTER — Encounter: Payer: Self-pay | Admitting: Internal Medicine

## 2012-03-07 NOTE — Assessment & Plan Note (Signed)
Weight is up. She is aware of the consequences of her weight and has the intention to loose weight. Touched upon key principles of weight loss with her: smart food choices, portion size control, aerobic exercise with a goal of gradual weight loss - 1-2 lbs/month

## 2012-03-07 NOTE — Assessment & Plan Note (Signed)
Continues to have problems with swallow and discomfort with swallow. She is reluctant to have further procedures.  Plan Reestablish with Dr. Leone Payor and seek his opinion about her diagnosis and treatment options.

## 2012-03-07 NOTE — Assessment & Plan Note (Signed)
Patient with continued flank pain with no determined etiology despite Abdominal U/S complete and MRI.  Plan  She reports the pain is bearable and not life limiting - no further diagnostics at this time  Consult with Dr. Leone Payor

## 2012-03-08 ENCOUNTER — Ambulatory Visit: Payer: Federal, State, Local not specified - PPO | Admitting: Pulmonary Disease

## 2012-03-09 ENCOUNTER — Ambulatory Visit
Admission: RE | Admit: 2012-03-09 | Discharge: 2012-03-09 | Disposition: A | Discharge status: Home / Self Care | Payer: Federal, State, Local not specified - PPO | Source: Ambulatory Visit | Attending: Orthopedic Surgery | Admitting: Orthopedic Surgery

## 2012-03-09 DIAGNOSIS — M25562 Pain in left knee: Secondary | ICD-10-CM

## 2012-03-19 ENCOUNTER — Ambulatory Visit: Payer: Federal, State, Local not specified - PPO | Admitting: Pulmonary Disease

## 2012-03-26 ENCOUNTER — Other Ambulatory Visit: Payer: Self-pay | Admitting: Internal Medicine

## 2012-04-09 ENCOUNTER — Ambulatory Visit (INDEPENDENT_AMBULATORY_CARE_PROVIDER_SITE_OTHER): Payer: Federal, State, Local not specified - PPO | Admitting: Pulmonary Disease

## 2012-04-09 ENCOUNTER — Encounter: Payer: Self-pay | Admitting: Pulmonary Disease

## 2012-04-09 VITALS — BP 110/70 | HR 70 | Temp 98.1°F | Ht 68.5 in | Wt 243.0 lb

## 2012-04-09 DIAGNOSIS — G4710 Hypersomnia, unspecified: Secondary | ICD-10-CM

## 2012-04-09 DIAGNOSIS — G4730 Sleep apnea, unspecified: Secondary | ICD-10-CM

## 2012-04-09 NOTE — Patient Instructions (Addendum)
We will give you feedback on the cpap report

## 2012-04-09 NOTE — Progress Notes (Signed)
  Subjective:    Patient ID: Jennifer [name], female    DOB: February 19, 1951, 61 y.o.   MRN: [id number]  HPI 61/F, never smoker with positional obstructive sleep apnea.  PSG in 10/04 when she weighed 210 lbs showed a TST of 250 mins incl 37 mins of REM, RDI was 15/h with lowest desaturation of 84% & modearte snoring, predominantly REM related events.  PSG april'11 (256 lbs )>>Mild-to-moderate obstructive sleep apnea - RDI was 17/h, non supine AHI was only 1.7/h   June , 2011- portable study (using positional device) shows AHI of 10/h with desaturation index 12/h .  psg (250 lbs) aug'11 - cpap titrated to 9 cm, smal full face mask  download 8/23- 01/11/10 >> no residual events, good compliance, minimal leak  Mild intermittent cough attributed to GERD - has Nisen's x 3 - Gessner/ [place]  Has some dysphagia   01/17/2011  Bronchitis has resolved. Wonders if new humidifer will help - she has trouble cleaning her present one    04/09/2012 1 y FU 7 lbs lighter Lt knee cartilage tear - considering surgery Compliant with CPAP, full face mask. Pressure ok, no leak    Review of Systems neg for any significant sore throat, dysphagia, itching, sneezing, nasal congestion or excess/ purulent secretions, fever, chills, sweats, unintended wt loss, pleuritic or exertional cp, hempoptysis, orthopnea pnd or change in chronic leg swelling. Also denies presyncope, palpitations, heartburn, abdominal pain, nausea, vomiting, diarrhea or change in bowel or urinary habits, dysuria,hematuria, rash, arthralgias, visual complaints, headache, numbness weakness or ataxia.     Objective:   Physical Exam  Gen. Pleasant, obese, in no distress ENT - no lesions, no post nasal drip Neck: No JVD, no thyromegaly, no carotid bruits Lungs: no use of accessory muscles, no dullness to percussion, decreased without rales or rhonchi  Cardiovascular: Rhythm regular, heart sounds  normal, no murmurs or gallops, no peripheral  edema Musculoskeletal: No deformities, no cyanosis or clubbing , no tremors        Assessment & Plan:

## 2012-04-10 NOTE — Assessment & Plan Note (Signed)
PSG april'11 (256 lbs )>>Mild-to-moderate obstructive sleep apnea - RDI was 17/h, non supine AHI was only 1.7/h  portable study (using positional device) showed AHI of 10/h with desaturation index 12/h .  psg (250 lbs) aug'11 - cpap titrated to 9 cm, smal full face mask    Weight loss encouraged, compliance with goal of at least 4-6 hrs every night is the expectation. Advised against medications with sedative side effects Cautioned against driving when sleepy - understanding that sleepiness will vary on a day to day basis

## 2012-04-14 ENCOUNTER — Telehealth: Payer: Self-pay | Admitting: Pulmonary Disease

## 2012-04-14 NOTE — Telephone Encounter (Signed)
Download 12/13 - cpap 9 very effective, no residuals, good usage Few ds not used  Leak ok

## 2012-04-15 NOTE — Telephone Encounter (Signed)
lmomtcb x1 for pt 

## 2012-04-16 NOTE — Telephone Encounter (Signed)
I spoke with patient about results and she verbalized understanding and had no questions 

## 2012-04-18 ENCOUNTER — Other Ambulatory Visit: Payer: Self-pay | Admitting: Orthopedic Surgery

## 2012-04-18 ENCOUNTER — Ambulatory Visit
Admission: RE | Admit: 2012-04-18 | Discharge: 2012-04-18 | Disposition: A | Discharge status: Home / Self Care | Payer: Federal, State, Local not specified - PPO | Source: Ambulatory Visit | Attending: Orthopedic Surgery | Admitting: Orthopedic Surgery

## 2012-04-18 DIAGNOSIS — R52 Pain, unspecified: Secondary | ICD-10-CM

## 2012-04-24 DIAGNOSIS — B029 Zoster without complications: Secondary | ICD-10-CM

## 2012-04-24 HISTORY — DX: Zoster without complications: B02.9

## 2012-05-03 ENCOUNTER — Other Ambulatory Visit: Payer: Self-pay | Admitting: Orthopedic Surgery

## 2012-05-08 ENCOUNTER — Encounter (HOSPITAL_COMMUNITY): Payer: Federal, State, Local not specified - PPO

## 2012-05-08 ENCOUNTER — Encounter (HOSPITAL_COMMUNITY)
Admission: RE | Admit: 2012-05-08 | Discharge: 2012-05-08 | Disposition: A | Discharge status: Home / Self Care | Payer: Federal, State, Local not specified - PPO | Source: Ambulatory Visit | Attending: Orthopedic Surgery | Admitting: Orthopedic Surgery

## 2012-05-08 ENCOUNTER — Encounter (HOSPITAL_COMMUNITY): Payer: Self-pay

## 2012-05-08 HISTORY — DX: Essential (primary) hypertension: I10

## 2012-05-08 HISTORY — DX: Anemia, unspecified: D64.9

## 2012-05-08 HISTORY — DX: Shortness of breath: R06.02

## 2012-05-08 HISTORY — DX: Irritable bowel syndrome, unspecified: K58.9

## 2012-05-08 HISTORY — DX: Cardiac murmur, unspecified: R01.1

## 2012-05-08 LAB — CBC
HCT: 38.8 % (ref 36.0–46.0)
Hemoglobin: 13.1 g/dL (ref 12.0–15.0)
MCV: 85.8 fL (ref 78.0–100.0)
RBC: 4.52 MIL/uL (ref 3.87–5.11)
WBC: 8.3 10*3/uL (ref 4.0–10.5)

## 2012-05-08 LAB — URINALYSIS, ROUTINE W REFLEX MICROSCOPIC
Bilirubin Urine: NEGATIVE
Ketones, ur: NEGATIVE mg/dL
Nitrite: NEGATIVE
Urobilinogen, UA: 0.2 mg/dL (ref 0.0–1.0)

## 2012-05-08 LAB — COMPREHENSIVE METABOLIC PANEL
BUN: 19 mg/dL (ref 6–23)
CO2: 28 mEq/L (ref 19–32)
Chloride: 104 mEq/L (ref 96–112)
Creatinine, Ser: 0.8 mg/dL (ref 0.50–1.10)
GFR calc non Af Amer: 79 mL/min — ABNORMAL LOW (ref >90)
Glucose, Bld: 112 mg/dL — ABNORMAL HIGH (ref 70–99)
Total Bilirubin: 0.2 mg/dL — ABNORMAL LOW (ref 0.3–1.2)

## 2012-05-08 LAB — PROTIME-INR
INR: 0.94 (ref 0.00–1.49)
Prothrombin Time: 12.5 seconds (ref 11.6–15.2)

## 2012-05-08 NOTE — Progress Notes (Signed)
Jennifer [name] has history of   MVP.  Patient states that she had a cardia cath many years ago- "I dont even remember the Dr-,"but it was normal.  EKG shows cannot rule out anterior infarct, age undetermined, I left chart for [name] to review.

## 2012-05-08 NOTE — Pre-Procedure Instructions (Addendum)
[name]  05/08/2012   Your procedure is scheduled on:  Thursday May 16, 2012  Report to [facility] at 11:00 AM.  Call this number if you have problems the morning of surgery: [phone]   Remember:   Do not eat food or drink liquids after midnight.   Take these medicines the morning of surgery with A SIP OF WATER: Aciphex. Xanax, Hyoscyamine, Antivert. if needed.    Do not wear jewelry, make-up or nail polish.  Do not wear lotions, powders, or perfumes.  Do not shave 48 hours prior to surgery.  Do not bring valuables to the hospital.  Contacts, dentures or bridgework may not be worn into surgery.  Leave suitcase in the car. After surgery it may be brought to your room.  For patients admitted to the hospital, checkout time is 11:00 AM the day of  discharge.   Patients discharged the day of surgery will not be allowed to drive  home.  Name and phone number of your driver: family / friend  Special Instructions: Shower using CHG 2 nights before surgery and the night before surgery.  If you shower the day of surgery use CHG.  Use special wash - you have one bottle of CHG for all showers.  You should use approximately 1/3 of the bottle for each shower.   Please read over the following fact sheets that you were given: Pain Booklet, Coughing and Deep Breathing, Blood Transfusion Information, Total Joint Packet, MRSA Information and Surgical Site Infection Prevention

## 2012-05-08 NOTE — Pre-Procedure Instructions (Signed)
ALAJIAH DUTKIEWICZ  05/08/2012   Your procedure is scheduled on:  Thursday May 16, 2012  Report to [facility] at 11:00 AM.  Call this number if you have problems the morning of surgery: [phone]   Remember:   Do not eat food or drink liquids after midnight.   Take these medicines the morning of surgery with A SIP OF WATER: Aciphex.Xanax, tramadol   Do not wear jewelry, make-up or nail polish.  Do not wear lotions, powders, or perfumes.  Do not shave 48 hours prior to surgery.  Do not bring valuables to the hospital.  Contacts, dentures or bridgework may not be worn into surgery.  Leave suitcase in the car. After surgery it may be brought to your room.  For patients admitted to the hospital, checkout time is 11:00 AM the day of  discharge.   Patients discharged the day of surgery will not be allowed to drive  home.  Name and phone number of your driver: family / friend  Special Instructions: Shower using CHG 2 nights before surgery and the night before surgery.  If you shower the day of surgery use CHG.  Use special wash - you have one bottle of CHG for all showers.  You should use approximately 1/3 of the bottle for each shower.   Please read over the following fact sheets that you were given: Pain Booklet, Coughing and Deep Breathing, Blood Transfusion Information, Total Joint Packet, MRSA Information and Surgical Site Infection Prevention

## 2012-05-09 NOTE — Consult Note (Addendum)
Anesthesia Chart Review:  Patient is a 62 year old female scheduled fro left knee arthroscopy on 05/16/12 by Dr. Montez Morita.  History includes obesity, non-smoker, LLE DVT/PE 04/2008, IBS, HTN, anemia, GERD, OSA with CPAP use, murmur with reported history of MVP, esophageal stenosis with multiple dilatations, dysphagia, chronic DOE, brain surgery for [place] Chiari Malformation '93, breast reduction '06, Nissen fundoplication.  (There were previous entries in her health history of chronic chest pain.  Her PAT nurse stated patient denied chest pain at her appointment.  I've left a message for patient to call me to clarify her history. She reported a normal cath, but several years ago.)  PCP is Dr. Illene Regulus.  Pulmonologist is Dr. Vassie Loll.  EKG on 05/08/12 showed NSR with first degree AVB, non-specific ST/T wave changes felt new since her previous tracing.      Echo on 04/30/08 showed: - Limited study due to poor acoustic windows. - Overall left ventricular systolic function was normal. Left ventricular ejection fraction was estimated , range being 55 % to 60 %. This study was inadequate for the evaluation of left ventricular regional wall motion. Left ventricular wall thickness was mildly increased. Features were consistent with a pseudonormal left ventricular filling pattern, with concomitant abnormal relaxation and increased filling Pressure. - Mitral valve: Grossly normal.  Trivial mitral regurgitation. - Tricuspid valve: Trivial tricuspid regurgitation. - The left atrium was mildly dilated. - There was a small pericardial effusion.  CXR on 05/08/12 showed no acute abnormalities.  Preoperative labs noted.  I'll follow-up once I hear back from Ms. Portilla.  Shonna Chock, PA-C 05/09/12 1454  Addendum: 05/10/12 0900 I spoke with Ms. Lyons.  She did get a sharp stabbing pain in her chest when they drew her blood yesterday that only lasted a couple seconds, but otherwise she does not report any  current chest pain.  Her chronic "chest pain" was more after her [employer] fundoplication with mesh (I believe in the early 2000's) and felt related to the mesh.  Up until about a month ago she was working with a Psychologist, educational at [employer] 3X week and doing aerobic exercise for at least 30 minutes without chest pain symptoms.  She stopped when her knee began hurting more.  She denies SOB at rest or significant DOE.  She has no known CAD/MI.  When I asked her about the cath, she said it may have been just a stress test.  I looked thru old PCP notes, and from a note on 01/31/06, it states "a nuclear stress study November 01, 2004, which revealed no evidence of __________ or ischemia, normal wall motion was noted, normal ejection fraction was noted."  There are no copies of a cath report in Epic of E-chart.  I was not asked to exam patient at her PAT visit, but she currently denies chest pain and reports regular aerobic activity up until about a month ago.  Other than her knee, she feels well.  She will be evaluated by her assigned anesthesiologist on the day of surgery, but would anticipate that if she does not develop any new CV symptoms then she could proceed as planned.

## 2012-05-15 MED ORDER — CHLORHEXIDINE GLUCONATE 4 % EX LIQD: 60.0000 mL | Freq: Once | CUTANEOUS | Status: DC

## 2012-05-15 MED ORDER — CEFAZOLIN SODIUM-DEXTROSE 2-3 GM-% IV SOLR
2.0000 g | INTRAVENOUS | Status: AC
  Administered 2012-05-16: 2 g via INTRAVENOUS
  Filled 2012-05-15: qty 50

## 2012-05-16 ENCOUNTER — Encounter (HOSPITAL_COMMUNITY): Payer: Self-pay | Admitting: *Deleted

## 2012-05-16 ENCOUNTER — Ambulatory Visit (HOSPITAL_COMMUNITY): Payer: Federal, State, Local not specified - PPO | Admitting: Vascular Surgery

## 2012-05-16 ENCOUNTER — Encounter (HOSPITAL_COMMUNITY): Payer: Self-pay | Admitting: General Practice

## 2012-05-16 ENCOUNTER — Encounter (HOSPITAL_COMMUNITY)
Admission: RE | Disposition: A | Discharge status: Home / Self Care | Payer: Self-pay | Source: Ambulatory Visit | Attending: Orthopedic Surgery

## 2012-05-16 ENCOUNTER — Encounter (HOSPITAL_COMMUNITY): Payer: Self-pay | Admitting: Vascular Surgery

## 2012-05-16 ENCOUNTER — Observation Stay (HOSPITAL_COMMUNITY)
Admission: RE | Admit: 2012-05-16 | Discharge: 2012-05-17 | Disposition: A | Discharge status: Home / Self Care | Payer: Federal, State, Local not specified - PPO | Source: Ambulatory Visit | Attending: Orthopedic Surgery | Admitting: Orthopedic Surgery

## 2012-05-16 DIAGNOSIS — Z86711 Personal history of pulmonary embolism: Secondary | ICD-10-CM | POA: Insufficient documentation

## 2012-05-16 DIAGNOSIS — K219 Gastro-esophageal reflux disease without esophagitis: Secondary | ICD-10-CM | POA: Insufficient documentation

## 2012-05-16 DIAGNOSIS — Z01810 Encounter for preprocedural cardiovascular examination: Secondary | ICD-10-CM | POA: Insufficient documentation

## 2012-05-16 DIAGNOSIS — Z01812 Encounter for preprocedural laboratory examination: Secondary | ICD-10-CM | POA: Insufficient documentation

## 2012-05-16 DIAGNOSIS — Z01818 Encounter for other preprocedural examination: Secondary | ICD-10-CM | POA: Insufficient documentation

## 2012-05-16 DIAGNOSIS — I10 Essential (primary) hypertension: Secondary | ICD-10-CM | POA: Insufficient documentation

## 2012-05-16 DIAGNOSIS — I059 Rheumatic mitral valve disease, unspecified: Secondary | ICD-10-CM | POA: Insufficient documentation

## 2012-05-16 DIAGNOSIS — M23302 Other meniscus derangements, unspecified lateral meniscus, unspecified knee: Principal | ICD-10-CM | POA: Insufficient documentation

## 2012-05-16 DIAGNOSIS — G4733 Obstructive sleep apnea (adult) (pediatric): Secondary | ICD-10-CM | POA: Insufficient documentation

## 2012-05-16 HISTORY — PX: KNEE ARTHROSCOPY: SHX127

## 2012-05-16 SURGERY — ARTHROSCOPY, KNEE
Anesthesia: Regional | Site: Knee | Laterality: Left | Wound class: Clean

## 2012-05-16 MED ORDER — PANTOPRAZOLE SODIUM 40 MG PO TBEC
40.0000 mg | DELAYED_RELEASE_TABLET | Freq: Every day | ORAL | Status: DC
  Administered 2012-05-16 – 2012-05-17 (×2): 40 mg via ORAL
  Filled 2012-05-16 (×2): qty 1

## 2012-05-16 MED ORDER — ACETAMINOPHEN 325 MG PO TABS: 650.0000 mg | ORAL_TABLET | Freq: Four times a day (QID) | ORAL | Status: DC | PRN

## 2012-05-16 MED ORDER — HYDROMORPHONE HCL PF 1 MG/ML IJ SOLN
0.5000 mg | INTRAMUSCULAR | Status: DC | PRN
  Administered 2012-05-16 – 2012-05-17 (×2): 1 mg via INTRAVENOUS
  Filled 2012-05-16 (×2): qty 1

## 2012-05-16 MED ORDER — HYDROMORPHONE HCL PF 1 MG/ML IJ SOLN
INTRAMUSCULAR | Status: AC
  Filled 2012-05-16: qty 1

## 2012-05-16 MED ORDER — PROPOFOL 10 MG/ML IV BOLUS
INTRAVENOUS | Status: DC | PRN
  Administered 2012-05-16: 200 mg via INTRAVENOUS

## 2012-05-16 MED ORDER — BUPIVACAINE HCL (PF) 0.25 % IJ SOLN
INTRAMUSCULAR | Status: DC | PRN
  Administered 2012-05-16: 14 mL

## 2012-05-16 MED ORDER — ONDANSETRON HCL 4 MG/2ML IJ SOLN
INTRAMUSCULAR | Status: DC | PRN
  Administered 2012-05-16: 4 mg via INTRAVENOUS

## 2012-05-16 MED ORDER — ONDANSETRON HCL 4 MG/2ML IJ SOLN
4.0000 mg | Freq: Four times a day (QID) | INTRAMUSCULAR | Status: DC | PRN
  Administered 2012-05-16 – 2012-05-17 (×2): 4 mg via INTRAVENOUS
  Filled 2012-05-16 (×2): qty 2

## 2012-05-16 MED ORDER — METHOCARBAMOL 500 MG PO TABS
500.0000 mg | ORAL_TABLET | Freq: Four times a day (QID) | ORAL | Status: DC | PRN
  Filled 2012-05-16 (×2): qty 1

## 2012-05-16 MED ORDER — ALPRAZOLAM 0.25 MG PO TABS: 0.2500 mg | ORAL_TABLET | Freq: Four times a day (QID) | ORAL | Status: DC | PRN

## 2012-05-16 MED ORDER — DEXAMETHASONE SODIUM PHOSPHATE 4 MG/ML IJ SOLN
INTRAMUSCULAR | Status: DC | PRN
  Administered 2012-05-16: 4 mg via INTRAVENOUS

## 2012-05-16 MED ORDER — MAGIC MOUTHWASH W/LIDOCAINE
5.0000 mL | Freq: Four times a day (QID) | ORAL | Status: DC | PRN
  Filled 2012-05-16: qty 10

## 2012-05-16 MED ORDER — FIRST-MOUTHWASH BLM MT SUSP: Freq: Every morning | OROMUCOSAL | Status: DC

## 2012-05-16 MED ORDER — HYDROMORPHONE HCL PF 1 MG/ML IJ SOLN
0.2500 mg | INTRAMUSCULAR | Status: DC | PRN
  Administered 2012-05-16 (×3): 0.5 mg via INTRAVENOUS

## 2012-05-16 MED ORDER — HYDROCODONE-ACETAMINOPHEN 10-325 MG PO TABS
1.0000 | ORAL_TABLET | ORAL | Status: DC | PRN
  Administered 2012-05-16 – 2012-05-17 (×2): 1 via ORAL
  Filled 2012-05-16 (×2): qty 1

## 2012-05-16 MED ORDER — HYOSCYAMINE SULFATE 0.125 MG SL SUBL
0.1250 mg | SUBLINGUAL_TABLET | SUBLINGUAL | Status: DC | PRN
  Filled 2012-05-16: qty 1

## 2012-05-16 MED ORDER — LIDOCAINE HCL (CARDIAC) 20 MG/ML IV SOLN
INTRAVENOUS | Status: DC | PRN
  Administered 2012-05-16: 50 mg via INTRAVENOUS

## 2012-05-16 MED ORDER — SODIUM CHLORIDE 0.9 % IR SOLN
Status: DC | PRN
  Administered 2012-05-16 (×2): 6000 mL

## 2012-05-16 MED ORDER — TEMAZEPAM 15 MG PO CAPS: 30.0000 mg | ORAL_CAPSULE | Freq: Once | ORAL | Status: DC

## 2012-05-16 MED ORDER — MENTHOL 3 MG MT LOZG: 1.0000 | LOZENGE | OROMUCOSAL | Status: DC | PRN

## 2012-05-16 MED ORDER — LACTATED RINGERS IV SOLN
INTRAVENOUS | Status: DC
  Administered 2012-05-16 – 2012-05-17 (×3): via INTRAVENOUS

## 2012-05-16 MED ORDER — MECLIZINE HCL 12.5 MG PO TABS
12.5000 mg | ORAL_TABLET | Freq: Three times a day (TID) | ORAL | Status: DC | PRN
  Filled 2012-05-16: qty 1

## 2012-05-16 MED ORDER — METOCLOPRAMIDE HCL 5 MG/ML IJ SOLN
5.0000 mg | Freq: Three times a day (TID) | INTRAMUSCULAR | Status: DC | PRN
  Administered 2012-05-17: 10 mg via INTRAVENOUS
  Filled 2012-05-16: qty 2

## 2012-05-16 MED ORDER — ACETAMINOPHEN 650 MG RE SUPP: 650.0000 mg | Freq: Four times a day (QID) | RECTAL | Status: DC | PRN

## 2012-05-16 MED ORDER — ONDANSETRON HCL 4 MG PO TABS: 4.0000 mg | ORAL_TABLET | Freq: Four times a day (QID) | ORAL | Status: DC | PRN

## 2012-05-16 MED ORDER — TEMAZEPAM 15 MG PO CAPS
30.0000 mg | ORAL_CAPSULE | Freq: Once | ORAL | Status: AC
  Administered 2012-05-17: 30 mg via ORAL
  Filled 2012-05-16 (×2): qty 1

## 2012-05-16 MED ORDER — ACETAMINOPHEN 10 MG/ML IV SOLN
INTRAVENOUS | Status: AC
  Administered 2012-05-16: 1000 mg via INTRAVENOUS
  Filled 2012-05-16: qty 100

## 2012-05-16 MED ORDER — TRIAMTERENE-HCTZ 37.5-25 MG PO TABS
1.0000 | ORAL_TABLET | Freq: Every day | ORAL | Status: DC
  Administered 2012-05-16 – 2012-05-17 (×2): 1 via ORAL
  Filled 2012-05-16 (×2): qty 1

## 2012-05-16 MED ORDER — MIDAZOLAM HCL 5 MG/5ML IJ SOLN
INTRAMUSCULAR | Status: DC | PRN
  Administered 2012-05-16: 2 mg via INTRAVENOUS

## 2012-05-16 MED ORDER — FENTANYL CITRATE 0.05 MG/ML IJ SOLN
INTRAMUSCULAR | Status: DC | PRN
  Administered 2012-05-16 (×4): 25 ug via INTRAVENOUS
  Administered 2012-05-16: 50 ug via INTRAVENOUS
  Administered 2012-05-16 (×2): 25 ug via INTRAVENOUS

## 2012-05-16 MED ORDER — ONDANSETRON HCL 4 MG PO TABS: 4.0000 mg | ORAL_TABLET | Freq: Three times a day (TID) | ORAL | Status: DC | PRN

## 2012-05-16 MED ORDER — METHOCARBAMOL 100 MG/ML IJ SOLN
500.0000 mg | Freq: Once | INTRAVENOUS | Status: AC
  Administered 2012-05-16: 500 mg via INTRAVENOUS
  Filled 2012-05-16: qty 5

## 2012-05-16 MED ORDER — PHENOL 1.4 % MT LIQD: 1.0000 | OROMUCOSAL | Status: DC | PRN

## 2012-05-16 MED ORDER — ETODOLAC 400 MG PO TABS
400.0000 mg | ORAL_TABLET | Freq: Two times a day (BID) | ORAL | Status: DC
  Administered 2012-05-16 – 2012-05-17 (×2): 400 mg via ORAL
  Filled 2012-05-16 (×3): qty 1

## 2012-05-16 MED ORDER — ASPIRIN 325 MG PO TABS
325.0000 mg | ORAL_TABLET | Freq: Every day | ORAL | Status: DC
  Administered 2012-05-16 – 2012-05-17 (×2): 325 mg via ORAL
  Filled 2012-05-16 (×2): qty 1

## 2012-05-16 MED ORDER — METOCLOPRAMIDE HCL 10 MG PO TABS: 5.0000 mg | ORAL_TABLET | Freq: Three times a day (TID) | ORAL | Status: DC | PRN

## 2012-05-16 SURGICAL SUPPLY — 33 items
BANDAGE ELASTIC 4 VELCRO ST LF (GAUZE/BANDAGES/DRESSINGS) ×2 IMPLANT
BANDAGE ELASTIC 6 VELCRO ST LF (GAUZE/BANDAGES/DRESSINGS) ×2 IMPLANT
BANDAGE GAUZE ELAST BULKY 4 IN (GAUZE/BANDAGES/DRESSINGS) ×2 IMPLANT
BLADE CUDA 5.5 (BLADE) IMPLANT
BLADE GREAT WHITE 4.2 (BLADE) ×2 IMPLANT
CLOTH BEACON ORANGE TIMEOUT ST (SAFETY) ×2 IMPLANT
COVER SURGICAL LIGHT HANDLE (MISCELLANEOUS) ×2 IMPLANT
CUFF TOURNIQUET SINGLE 34IN LL (TOURNIQUET CUFF) IMPLANT
CUFF TOURNIQUET SINGLE 44IN (TOURNIQUET CUFF) IMPLANT
DECANTER SPIKE VIAL GLASS SM (MISCELLANEOUS) ×2 IMPLANT
DRAPE ARTHROSCOPY W/POUCH 114 (DRAPES) ×2 IMPLANT
DRAPE U-SHAPE 47X51 STRL (DRAPES) ×2 IMPLANT
DRSG ADAPTIC 3X8 NADH LF (GAUZE/BANDAGES/DRESSINGS) ×1 IMPLANT
DRSG EMULSION OIL 3X3 NADH (GAUZE/BANDAGES/DRESSINGS) ×2 IMPLANT
DRSG PAD ABDOMINAL 8X10 ST (GAUZE/BANDAGES/DRESSINGS) ×2 IMPLANT
DURAPREP 26ML APPLICATOR (WOUND CARE) ×2 IMPLANT
GLOVE SS PI 9.0 STRL (GLOVE) ×2 IMPLANT
GOWN PREVENTION PLUS XLARGE (GOWN DISPOSABLE) ×2 IMPLANT
GOWN STRL NON-REIN LRG LVL3 (GOWN DISPOSABLE) ×2 IMPLANT
KIT BASIN OR (CUSTOM PROCEDURE TRAY) ×2 IMPLANT
KIT ROOM TURNOVER OR (KITS) ×2 IMPLANT
MANIFOLD NEPTUNE II (INSTRUMENTS) ×2 IMPLANT
PACK ARTHROSCOPY DSU (CUSTOM PROCEDURE TRAY) ×2 IMPLANT
PAD ARMBOARD 7.5X6 YLW CONV (MISCELLANEOUS) ×4 IMPLANT
PADDING CAST COTTON 6X4 STRL (CAST SUPPLIES) ×1 IMPLANT
SET ARTHROSCOPY TUBING (MISCELLANEOUS) ×2
SET ARTHROSCOPY TUBING LN (MISCELLANEOUS) ×1 IMPLANT
SPONGE GAUZE 4X4 12PLY (GAUZE/BANDAGES/DRESSINGS) ×2 IMPLANT
SPONGE LAP 4X18 X RAY DECT (DISPOSABLE) ×2 IMPLANT
SUT ETHILON 4 0 PS 2 18 (SUTURE) ×2 IMPLANT
SYR CONTROL 10ML LL (SYRINGE) ×2 IMPLANT
TOWEL OR 17X24 6PK STRL BLUE (TOWEL DISPOSABLE) ×2 IMPLANT
WATER STERILE IRR 1000ML POUR (IV SOLUTION) ×2 IMPLANT

## 2012-05-16 NOTE — Anesthesia Postprocedure Evaluation (Signed)
  Anesthesia Post-op Note  Patient: Jennifer [name]  Procedure(s) Performed: Procedure(s) (LRB) with comments: ARTHROSCOPY KNEE (Left)  Patient Location: PACU  Anesthesia Type:General  Level of Consciousness: sedated, patient cooperative and responds to stimulation and voice  Airway and Oxygen Therapy: Patient Spontanous Breathing and Patient connected to nasal cannula oxygen  Post-op Pain: none  Post-op Assessment: Post-op Vital signs reviewed, Patient's Cardiovascular Status Stable, Respiratory Function Stable, Patent Airway, No signs of Nausea or vomiting and Pain level controlled  Post-op Vital Signs: Reviewed and stable  Complications: No apparent anesthesia complications

## 2012-05-16 NOTE — Brief Op Note (Signed)
05/16/2012  2:23 PM  PATIENT:  Jennifer [name]  62 y.o. female  PRE-OPERATIVE DIAGNOSIS:  LATERAL MENISCAL TEAR LEFT KNEE   POST-OPERATIVE DIAGNOSIS:  LATERAL MENISCAL TEAR LEFT KNEE   PROCEDURE:  Procedure(s) (LRB) with comments: ARTHROSCOPY KNEE (Left)  SURGEON:  Surgeon(s) and Role:    * Kennieth Rad, MD - Primary  PHYSICIAN ASSISTANT:   ASSISTANTS: none   ANESTHESIA:   general  EBL:  Total I/O In: 1000 [I.V.:1000] Out: -   BLOOD ADMINISTERED:none  DRAINS: none   LOCAL MEDICATIONS USED:  MARCAINE     SPECIMEN:  No Specimen  DISPOSITION OF SPECIMEN:  N/A  COUNTS:  YES  TOURNIQUET:   Total Tourniquet Time Documented: Thigh (Left) - 43 minutes  DICTATION: .Other Dictation: Dictation Number REPORT #[id number]  PLAN OF CARE: Admit for overnight observation  PATIENT DISPOSITION:  PACU - hemodynamically stable.   Delay start of Pharmacological VTE agent (>24hrs) due to surgical blood loss or risk of bleeding: not applicable

## 2012-05-16 NOTE — Preoperative (Signed)
Beta Blockers   Reason not to administer Beta Blockers:Not Applicable 

## 2012-05-16 NOTE — H&P (Signed)
Jennifer [name] is an 62 y.o. female.   Chief Complaint: PAINFUL LEFT KNEE HPI: THIS IS A 61 Y/O FEMALE TREATED FOR LEFT KNEE PAIN ,SWELLING AND CATCHING ,TREATED WITH ANTI-INFLAMMATORY MED. WITH OUT IMPROVEMENT . MRI DEMONSTRATE MENISCAL TEAR COMPLEX TYPE.  Past Medical History  Diagnosis Date  . Esophageal stenosis     Stricture after fundoplication REDO  . Tinnitus     Subjective  . Mitral valve prolapse   . Irritable bowel syndrome   . GERD (gastroesophageal reflux disease)   . Costochondritis   . OSA (obstructive sleep apnea)     Mild - Sleep study 2004  . Pulmonary embolus 04/2008  . Chronic chest pain   . Dysphasia     Chronic  . Shortness of breath     with exertion  . Hypertension   . Heart murmur   . IBS (irritable bowel syndrome)   . Anemia     Past Surgical History  Procedure Date  . Brain surgery 1995    Stem surgery, Arnold Chiari Malformation  . Breast reduction surgery   . Appendectomy   . Abdominal hysterectomy   . Nissen fundoplication     X 3 lab, open x 2 last with mesh  . Cholecystectomy 2008  . Hernia repair     Hital Hernia    Family History  Problem Relation Age of Onset  . Diabetes Mother   . Rheum arthritis Mother   . Hypertension Mother   . Hypertension Father   . Rheum arthritis Sister   . Hypertension Sister   . Colon cancer Maternal Grandmother   . Rheum arthritis Sister   . Rheum arthritis Sister   . Rheum arthritis Sister    Social History:  reports that she has never smoked. She has never used smokeless tobacco. She reports that she does not drink alcohol. Her drug history not on file.  Allergies:  Allergies  Allergen Reactions  . Morphine Anaphylaxis  . Promethazine Hcl     Hallucinations  . Sulfonamide Derivatives Hives    Medications Prior to Admission  Medication Sig Dispense Refill  . ACIPHEX 20 MG tablet TAKE ONE TABLET BY MOUTH TWICE DAILY  60 each  5  . aspirin 325 MG tablet Take 325 mg by mouth daily.        Marland Kitchen  dexamethasone (DECADRON) 4 MG tablet Take 4 mg by mouth daily with breakfast.      . etodolac (LODINE) 400 MG tablet Take 400 mg by mouth 2 (two) times daily.      Marland Kitchen lidocaine (LIDODERM) 5 % Place 1 patch onto the skin daily. Remove & Discard patch within 12 hours or as directed by MD  30 patch  1  . temazepam (RESTORIL) 30 MG capsule TAKE ONE CAPSULE BY MOUTH AT BEDTIME  30 capsule  5  . triamterene-hydrochlorothiazide (MAXZIDE-25) 37.5-25 MG per tablet TAKE ONE-HALF TABLET BY MOUTH EVERY DAY  30 tablet  2  . ALPRAZolam (XANAX) 0.25 MG tablet Take 1 tablet (0.25 mg total) by mouth every 6 (six) hours as needed.  30 tablet  5  . FIRST-MOUTHWASH BLM SUSP SWISH AND SWALLOW ONE TO TWO TEASPOONSFUL  AS NEEDED FOR  SENSITIVE  MOUTH  237 mL  6  . hyoscyamine (LEVSIN SL) 0.125 MG SL tablet Place 1 tablet (0.125 mg total) under the tongue every 4 (four) hours as needed.  30 tablet  1  . meclizine (ANTIVERT) 12.5 MG tablet Take 12.5 mg by mouth  3 (three) times daily as needed. As needed for Dizziness       . ondansetron (ZOFRAN) 4 MG tablet Take 1 tablet (4 mg total) by mouth every 8 (eight) hours as needed.  20 tablet  0  . traMADol (ULTRAM) 50 MG tablet Take 1 or 2 every 6 hours as needed for pain  120 tablet  0    No results found for this or any previous visit (from the past 48 hour(s)). No results found.  Review of Systems  Constitutional: Negative.   HENT: Negative.   Eyes: Negative.   Respiratory: Negative.   Cardiovascular: Negative.   Gastrointestinal: Positive for heartburn.  Genitourinary: Negative.   Musculoskeletal: Positive for joint pain.  Skin: Negative.   Neurological: Negative.   Endo/Heme/Allergies: Negative.   Psychiatric/Behavioral: Negative.     Blood pressure 109/75, pulse 75, temperature 97.8 F (36.6 C), temperature source Oral, resp. rate 20, SpO2 98.00%. Physical Exam LEFT KNEE TENDER WITH PLUS 2 EFFUSION, POSITIVE MCMURRAY,S TEST WITH PALPABLE CLICK range of motion  IS GOOD ,STABLE, NEG. LOCHMANN ' SIGN. Assessment/Plan TEAR MENISCUS LEFT KNEE/PLAN ARTHROSCOPY LEFT KNEE  Jennifer [name] F 05/16/2012, 1:00 PM

## 2012-05-16 NOTE — Transfer of Care (Signed)
Immediate Anesthesia Transfer of Care Note  Patient: Jennifer [name]  Procedure(s) Performed: Procedure(s) (LRB) with comments: ARTHROSCOPY KNEE (Left)  Patient Location: PACU  Anesthesia Type:General  Level of Consciousness: awake, alert  and oriented  Airway & Oxygen Therapy: Patient Spontanous Breathing and Patient connected to nasal cannula oxygen  Post-op Assessment: Report given to PACU RN, Post -op Vital signs reviewed and stable and Patient moving all extremities X 4  Post vital signs: Reviewed and stable  Complications: No apparent anesthesia complications

## 2012-05-16 NOTE — Anesthesia Procedure Notes (Signed)
Procedure Name: LMA Insertion Date/Time: 05/16/2012 1:14 PM Performed by: Elon Alas Pre-anesthesia Checklist: Patient identified, Timeout performed, Emergency Drugs available, Suction available and Patient being monitored Patient Re-evaluated:Patient Re-evaluated prior to inductionOxygen Delivery Method: Circle system utilized Preoxygenation: Pre-oxygenation with 100% oxygen Intubation Type: IV induction Ventilation: Mask ventilation without difficulty LMA: LMA with gastric port inserted LMA Size: 4.0 Number of attempts: 1 Placement Confirmation: positive ETCO2 and breath sounds checked- equal and bilateral Tube secured with: Tape Dental Injury: Teeth and Oropharynx as per pre-operative assessment

## 2012-05-16 NOTE — Anesthesia Preprocedure Evaluation (Addendum)
Anesthesia Evaluation  Patient identified by MRN, date of birth, ID band Patient awake    Reviewed: Allergy & Precautions, H&P , NPO status , Patient's Chart, lab work & pertinent test results  Airway Mallampati: II TM Distance: >3 FB Neck ROM: Full    Dental No notable dental hx. (+) Dental Advisory Given and Teeth Intact   Pulmonary shortness of breath and with exertion, sleep apnea and Continuous Positive Airway Pressure Ventilation , PE breath sounds clear to auscultation  Pulmonary exam normal       Cardiovascular hypertension, negative cardio ROS  + Valvular Problems/Murmurs MVP Rhythm:Regular Rate:Normal  Echo on 04/30/08 showed: - Overall left ventricular systolic function was normal. Left ventricular ejection fraction was estimated , range being 55% to 60 %. This study was inadequate for the evaluation ofleft ventricular regional wall motion. Left ventricular wall thickness was mildly increased. Features were consistent with a pseudonormal left ventricular filling pattern, with concomitant abnormal relaxation and increased filling Pressure. - Mitral valve: Grossly normal.  Trivial mitral regurgitation. - Tricuspid valve: Trivial tricuspid regurgitation. - The left atrium was mildly dilated. - There was a small pericardial effusion.    Neuro/Psych  Headaches, negative psych ROS   GI/Hepatic Neg liver ROS, GERD-  Medicated and Controlled,  Endo/Other  Morbid obesity  Renal/GU negative Renal ROS  negative genitourinary   Musculoskeletal   Abdominal   Peds  Hematology negative hematology ROS (+)   Anesthesia Other Findings   Reproductive/Obstetrics negative OB ROS                       Anesthesia Physical Anesthesia Plan  ASA: III  Anesthesia Plan: General   Post-op Pain Management:    Induction: Intravenous  Airway Management Planned: LMA  Additional Equipment:   Intra-op Plan:     Post-operative Plan: Extubation in OR  Informed Consent: I have reviewed the patients History and Physical, chart, labs and discussed the procedure including the risks, benefits and alternatives for the proposed anesthesia with the patient or authorized representative who has indicated his/her understanding and acceptance.   Dental advisory given  Plan Discussed with: Anesthesiologist and Surgeon  Anesthesia Plan Comments:        Anesthesia Quick Evaluation

## 2012-05-17 ENCOUNTER — Encounter (HOSPITAL_COMMUNITY): Payer: Self-pay | Admitting: Orthopedic Surgery

## 2012-05-17 MED ORDER — METHOCARBAMOL 500 MG PO TABS: 500.0000 mg | ORAL_TABLET | Freq: Four times a day (QID) | ORAL | Status: DC | PRN

## 2012-05-17 MED ORDER — OXYCODONE-ACETAMINOPHEN 7.5-325 MG PO TABS: 1.0000 | ORAL_TABLET | ORAL | Status: DC | PRN

## 2012-05-17 NOTE — Evaluation (Signed)
Physical Therapy Evaluation Patient Details Name: Jennifer [name] MRN: [id number] DOB: July 27, 1950 Today's Date: 05/17/2012 Time: 1191-4782 PT Time Calculation (min): 41 min  PT Assessment / Plan / Recommendation Clinical Impression  pt rpesents with L knee scope.  pt very motivated and anticipate will make great progress.  pt would benefit from OPPT once ok with MD.      PT Assessment  All further PT needs can be met in the next venue of care    Follow Up Recommendations  Outpatient PT;Supervision - Intermittent    Does the patient have the potential to tolerate intense rehabilitation      Barriers to Discharge None      Equipment Recommendations  Rolling walker with 5" wheels (3-in-1)    Recommendations for Other Services OT consult   Frequency      Precautions / Restrictions Precautions Precautions: Fall Restrictions Weight Bearing Restrictions: Yes LLE Weight Bearing: Partial weight bearing LLE Partial Weight Bearing Percentage or Pounds: 50   Pertinent Vitals/Pain Pt indicates pain during ROM.        Mobility  Bed Mobility Bed Mobility: Supine to Sit;Sitting - Scoot to Edge of Bed Supine to Sit: 4: Min assist Sitting - Scoot to Edge of Bed: 4: Min assist Details for Bed Mobility Assistance: A with L LE only.   Transfers Transfers: Sit to Stand;Stand to Sit Sit to Stand: 4: Min guard;Without upper extremity assist;From bed;From chair/3-in-1;With armrests Stand to Sit: 4: Min guard;With upper extremity assist;To chair/3-in-1;With armrests Details for Transfer Assistance: cues for use of UEs and positioning LEs  Ambulation/Gait Ambulation/Gait Assistance: 4: Min guard Ambulation Distance (Feet): 120 Feet (140) Assistive device: Rolling walker Ambulation/Gait Assistance Details: cues for gait sequencing, upright posture, postioning in RW.   Gait Pattern: Step-through pattern;Decreased step length - right;Decreased stance time - left Stairs: Yes Stairs Assistance:  4: Min assist Stairs Assistance Details (indicate cue type and reason): cues for safe technique with RW.   Stair Management Technique: No rails;Backwards;With walker Number of Stairs: 1  Wheelchair Mobility Wheelchair Mobility: No    Shoulder Instructions     Exercises     PT Diagnosis:    PT Problem List:   PT Treatment Interventions:     PT Goals    Visit Information  Last PT Received On: 05/17/12 Assistance Needed: +1    Subjective Data  Subjective: I hope this fixes my knee.   Patient Stated Goal: Home today.     Prior Functioning  Home Living Lives With: Spouse Available Help at Discharge: Family;Available 24 hours/day Type of Home: House Home Access: Stairs to enter [employer] of Steps: 1 ( to get on porch, then 1 to get in door.  ) Entrance Stairs-Rails: None Home Layout: One level Bathroom Shower/Tub: Engineer, manufacturing systems: Standard Home Adaptive Equipment: None Prior Function Level of Independence: Independent Able to Take Stairs?: Yes Driving: Yes Vocation: Full time employment Communication Communication: No difficulties    Cognition  Overall Cognitive Status: Appears within functional limits for tasks assessed/performed Arousal/Alertness: Awake/alert Orientation Level: Appears intact for tasks assessed Behavior During Session: [facility] for tasks performed    Extremity/Trunk Assessment Right Lower Extremity Assessment RLE ROM/Strength/Tone: WFL for tasks assessed RLE Sensation: WFL - Light Touch Left Lower Extremity Assessment LLE ROM/Strength/Tone: Deficits LLE ROM/Strength/Tone Deficits: Limited by pain.  AAROM grossly 10 - 60 LLE Sensation: WFL - Light Touch Trunk Assessment Trunk Assessment: Normal   Balance Balance Balance Assessed: No  End of Session PT -  End of Session Equipment Utilized During Treatment: Gait belt Activity Tolerance:  (Limited by nausea) Patient left: in chair;with call bell/phone within reach;with  family/visitor present Nurse Communication: Mobility status  GP Functional Assessment Tool Used: Clinical Judgement Functional Limitation: Mobility: Walking and moving around Mobility: Walking and Moving Around Current Status ([id number]): At least 1 percent but less than 20 percent impaired, limited or restricted Mobility: Walking and Moving Around Goal Status [phone]): At least 1 percent but less than 20 percent impaired, limited or restricted Mobility: Walking and Moving Around Discharge Status [phone]): At least 1 percent but less than 20 percent impaired, limited or restricted   Sunny Schlein,  [phone] 05/17/2012, 9:55 AM

## 2012-05-17 NOTE — Progress Notes (Signed)
Courtesy note  Pateint post-op left knee arthroscopy. Vitals signs stable.she is in good spirits and claims she will be going home today. Adivsed to call if I can be of any assistance.

## 2012-05-17 NOTE — Op Note (Signed)
NAMEDANILLE, OPPEDISANO NO.:  [id number]  MEDICAL RECORD NO.:  [id number]  LOCATION:  5N14C                        FACILITY:  [facility]  PHYSICIAN:  Myrtie Neither, MD      DATE OF BIRTH:  1951-02-05  DATE OF PROCEDURE:  05/16/2012 DATE OF DISCHARGE:                              OPERATIVE REPORT   PREOPERATIVE DIAGNOSIS:  Lateral meniscal tear, left knee.  POSTOPERATIVE DIAGNOSIS:  Lateral meniscal tear, left knee; patellar plica and synovitis, left knee.  ANESTHESIA:  General.  PROCEDURE:  Arthroscopic lateral meniscectomy and partial synovectomy with excision of plica.  DESCRIPTION OF PROCEDURE:  The patient was taken to the operating room. After giving adequate preop medications, given general anesthesia and intubated.  Left knee was prepped with DuraPrep and draped in the sterile manner.  Tourniquet was used for hemostasis.  A 1.5 inch puncture wound was made in the anterior medial and lateral joint line. Inflow was to the medial suprapatellar pouch area.  Inspection under general anesthesia demonstrated hypertrophic overgrowth of the synovial lining, both medial and lateral compartment, thickened patellar plica with a complex lateral meniscal tear.  With the synovial shaver, synovectomy both medial and lateral compartment was done followed by with use of basket forceps, the lateral meniscectomy was done. Apparently, debridement of that joint and pertinent loose fragments was then done.  Articular surface of the lateral femoral condyle still showed some damage, but with very minimal, well preserved, and medial femoral condyle was well preserved.  The ACL just demonstrated some moderate degenerative changes.  Inspection did not reveal any other loose bodies.  Wound closure was then done with 4-0 nylon.  A 14 mL of 0.25% plain with Marcaine was injected into the knee.  Compressive dressing was applied.  The patient tolerated the procedure quite well. The wound  was closed with 4-0 nylon.  The patient went to recovery room in stable and satisfactory condition to be kept on 23-hour observation for pain control.  To be discharged on Percocet 1-2 q.4 p.r.n. for pain, ice packs, elevation, and partial weightbearing of the left knee.  The patient would be seen back in the office in 1 week.     Myrtie Neither, MD     AC/MEDQ  D:  05/16/2012  T:  05/17/2012  Job:  [id number]

## 2012-05-17 NOTE — Discharge Summary (Signed)
NAMEGENESIA, CASLIN NO.:  [id number]  MEDICAL RECORD NO.:  [id number]  LOCATION:  5N14C                        FACILITY:  [facility]  PHYSICIAN:  Myrtie Neither, MD      DATE OF BIRTH:  Dec 15, 1950  DATE OF ADMISSION:  05/16/2012 DATE OF DISCHARGE:  05/17/2012                              DISCHARGE SUMMARY   ADMISSION DIAGNOSES: 1. Internal derangement, bilateral meniscal tear of left knee. 2. History of hypertension. 3. History of pulmonary embolus. 4. Venous insufficiency.  DISCHARGE DIAGNOSES: 1. Internal derangement, bilateral meniscal tear of left knee. 2. History of hypertension. 3. History of pulmonary embolus. 4. Venous insufficiency.  COMPLICATIONS:  None.  INFECTIONS:  None.  OPERATION:  Arthroscopic left knee.  PERTINENT HISTORY:  This is a 62 year old female, followed in the office for internal derangement of the left knee.  MRI demonstrated complex tear of lateral meniscus.  The patient had been treated with anti- inflammatories, and use of steroids without improvement of symptoms. Pertinent physical exam of the left knee; tendon anteromedially. Positive McMurray's test with palpable audible click of the lateral compartment.  Range of motion is good.  Negative Lachman.  Negative pivot shift.  MRI demonstrates a complex lateral meniscal tear.  HOSPITAL COURSE:  The patient underwent preop laboratory CBC, EKG, chest x-ray, PT, PTT, UA, c-Met.  The patient's labs were stable enough to undergo surgery.  The patient underwent arthroscopic lateral meniscectomy and synovectomy.  She tolerated the procedure quite well. Postop course, the patient was started on partial weightbearing 50% on the left leg with use of crutches.  The patient is being discharged on Percocet 1-2 q.4 h. p.r.n. for pain, ice packs, partial weightbearing left side.  Return to the office in 1 week.  The patient being discharged in stable and satisfactory condition.  A  50% weightbearing, left knee.     Myrtie Neither, MD     AC/MEDQ  D:  05/17/2012  T:  05/17/2012  Job:  [id number]

## 2012-05-17 NOTE — Progress Notes (Signed)
Utilization review completed. Braelyn Bordonaro, RN, BSN. 

## 2012-05-17 NOTE — Care Management (Signed)
Cpap set up in pt room at 10cmh2o with full face mask. Pt vomiting at this time. Told rn and pt to call rt when pt was feeling better with no vomiting.

## 2012-05-17 NOTE — Evaluation (Signed)
Occupational Therapy Evaluation Patient Details Name: ISABELA NARDELLI MRN: [id number] DOB: 1951-02-10 Today's Date: 05/17/2012 Time: 8657-8469 OT Time Calculation (min): 37 min  OT Assessment / Plan / Recommendation Clinical Impression  Pt. husband to assist with LE ADL's. (Pt. is agreeable to 3-1 for toileting. Pt. to take sink bath)    OT Assessment  Patient does not need any further OT services    Follow Up Recommendations  No OT follow up    Barriers to Discharge      Equipment Recommendations  3 in 1 bedside comode    Recommendations for Other Services    Frequency       Precautions / Restrictions Precautions Precautions: Fall Precaution Comments:  (PWB 50lbs.) Restrictions Weight Bearing Restrictions: Yes LLE Weight Bearing: Touchdown weight bearing LLE Partial Weight Bearing Percentage or Pounds: 50       ADL  Eating/Feeding: Simulated;Independent Grooming: Performed;Supervision/safety Where Assessed - Grooming: Supported standing Upper Body Bathing: Simulated;Set up Where Assessed - Upper Body Bathing: Unsupported sitting Lower Body Bathing: Moderate assistance;Simulated Where Assessed - Lower Body Bathing: Supported standing;Unsupported sitting Upper Body Dressing: Set up;Simulated Where Assessed - Upper Body Dressing: Unsupported sitting Lower Body Dressing: Performed;Maximal assistance Where Assessed - Lower Body Dressing: Unsupported sitting;Supported standing Toilet Transfer: Performed;Minimal assistance Toilet Transfer Method: Stand pivot Toilet Transfer Equipment: Raised toilet seat with arms (or 3-in-1 over toilet) Toileting - Clothing Manipulation and Hygiene: Minimal assistance Where Assessed - Toileting Clothing Manipulation and Hygiene: Sit on 3-in-1 or toilet ADL Comments:  (Discussed that pt. should not get into bathtub until cleared)    OT Diagnosis:    OT Problem List:   OT Treatment Interventions:     OT Goals    Visit Information  Last OT Received On: 05/17/12 Assistance Needed: +1    Subjective Data  Subjective:  (Pt. agreeable to OT) Patient Stated Goal:  (Go home)   Prior Functioning     Home Living Lives With: Spouse Available Help at Discharge: Family Type of Home: House Home Access: Stairs to enter Secretary/administrator of Steps: 1 Bathroom Shower/Tub: Engineer, manufacturing systems: Standard Additional Comments:  (Pt. instructed to take sink baths until WBAT. Pt. is an Geologist, engineering) Prior Function Level of Independence: Independent Communication Communication: No difficulties         Vision/Perception     Cognition  Overall Cognitive Status: Appears within functional limits for tasks assessed/performed Arousal/Alertness: Awake/alert Orientation Level: Appears intact for tasks assessed Behavior During Session: [facility] for tasks performed    Extremity/Trunk Assessment Right Upper Extremity Assessment RUE ROM/Strength/Tone: Within functional levels Left Upper Extremity Assessment LUE ROM/Strength/Tone: Within functional levels     Mobility Transfers Sit to Stand: 4: Min guard Stand to Sit: 4: Min guard Details for Transfer Assistance: cues for use of UEs and positioning LEs      Shoulder Instructions     Exercise     Balance     End of Session OT - End of Session Activity Tolerance: Patient tolerated treatment well Patient left: in chair;with call bell/phone within reach;with family/visitor present  GO     Melessa Cowell 05/17/2012, 1:39 PM

## 2012-05-17 NOTE — H&P (Signed)
Jennifer [name], Jennifer [name] NO.:  [id number]  MEDICAL RECORD NO.:  [id number]  LOCATION:  5N14C                        FACILITY:  [facility]  PHYSICIAN:  Myrtie Neither, MD      DATE OF BIRTH:  05/24/1950  DATE OF ADMISSION:  05/16/2012 DATE OF DISCHARGE:                             HISTORY & PHYSICAL   CHIEF COMPLAINT:  Painful left knee.  HISTORY OF PRESENT ILLNESS:  This is a 62 year old female being treated for internal derangement,  lateral meniscal tear, with anti- inflammatories, and steroids to use without any improvement, continued pain, swelling, and catching in the left knee.  The patient has history of injury to the left knee in the past.  PAST MEDICAL HISTORY:  Hypertension, history of pulmonary embolus, varicose veins, venous insufficiency, pulmonary nodule, esophageal stenosis, GERD, diverticulosis of the colon, irritable bowel syndrome, history of reduction mammoplasty, hysterectomy, appendectomy, history of bronchitis, history of mitral valve prolapse.  ALLERGIES:  Morphine and promethazine.  REVIEW OF SYSTEMS:  Basically symptoms of GERD.  No urinary or bowel symptoms.  FAMILY HISTORY:  Noncontributory.  SOCIAL HISTORY:  No use of alcohol, tobacco, or illegal drugs.  MEDICATIONS:  AcipHex 20 mg, Xanax 0.25 mg, aspirin 325 mg, Lodine 400 mg b.i.d., Levsin SL 0.125 mg sublingual, Lidoderm patch 5%, Antivert 12.5 mg, Zofran 4 mg, Restoril 30 mg, tramadol 50 mg, and Maxzide 37.5/25 daily.  PHYSICAL EXAMINATION:  GENERAL:  Alert and oriented.  No acute distress. VITAL SIGNS:  Temperature is 97.8, pulse 75, respirations 20, O2 saturation 98%, blood pressure 109/75.  Height 5 feet 8 inches.  Weight 114.85 kg. HEAD:  Normocephalic. EYES:  Conjunctivae and sclerae clear. NECK:  Supple. CHEST:  Clear. CARDIAC:  S1, S2.  Regular. EXTREMITIES:  Left knee +2 effusion.  Positive McMurray test with palpable and audible click over the lateral compartment.   Anterior patella tenderness.  Range of motion was full. Negative Lachman's test. Negative pivot shift.  MRI demonstrates complex lateral meniscal tear, left knee.  IMPRESSION:  Complex lateral meniscal tear, left knee.  Internal derangement, left knee.  PLAN:  Arthroscopy, left knee.     Myrtie Neither, MD     AC/MEDQ  D:  05/16/2012  T:  05/17/2012  Job:  [id number]

## 2012-06-29 ENCOUNTER — Emergency Department (HOSPITAL_COMMUNITY): Payer: Federal, State, Local not specified - PPO

## 2012-06-29 ENCOUNTER — Emergency Department (HOSPITAL_COMMUNITY)
Admission: EM | Admit: 2012-06-29 | Discharge: 2012-06-29 | Disposition: A | Discharge status: Home / Self Care | Payer: Federal, State, Local not specified - PPO | Attending: Emergency Medicine | Admitting: Emergency Medicine

## 2012-06-29 ENCOUNTER — Encounter (HOSPITAL_COMMUNITY): Payer: Self-pay | Admitting: Emergency Medicine

## 2012-06-29 ENCOUNTER — Telehealth: Payer: Self-pay

## 2012-06-29 DIAGNOSIS — R109 Unspecified abdominal pain: Secondary | ICD-10-CM | POA: Insufficient documentation

## 2012-06-29 DIAGNOSIS — Z862 Personal history of diseases of the blood and blood-forming organs and certain disorders involving the immune mechanism: Secondary | ICD-10-CM | POA: Insufficient documentation

## 2012-06-29 DIAGNOSIS — Z8739 Personal history of other diseases of the musculoskeletal system and connective tissue: Secondary | ICD-10-CM | POA: Insufficient documentation

## 2012-06-29 DIAGNOSIS — Z9089 Acquired absence of other organs: Secondary | ICD-10-CM | POA: Insufficient documentation

## 2012-06-29 DIAGNOSIS — Z79899 Other long term (current) drug therapy: Secondary | ICD-10-CM | POA: Insufficient documentation

## 2012-06-29 DIAGNOSIS — I10 Essential (primary) hypertension: Secondary | ICD-10-CM | POA: Insufficient documentation

## 2012-06-29 DIAGNOSIS — K219 Gastro-esophageal reflux disease without esophagitis: Secondary | ICD-10-CM | POA: Insufficient documentation

## 2012-06-29 DIAGNOSIS — R011 Cardiac murmur, unspecified: Secondary | ICD-10-CM | POA: Insufficient documentation

## 2012-06-29 DIAGNOSIS — Z8719 Personal history of other diseases of the digestive system: Secondary | ICD-10-CM | POA: Insufficient documentation

## 2012-06-29 DIAGNOSIS — Z8673 Personal history of transient ischemic attack (TIA), and cerebral infarction without residual deficits: Secondary | ICD-10-CM | POA: Insufficient documentation

## 2012-06-29 DIAGNOSIS — R110 Nausea: Secondary | ICD-10-CM | POA: Insufficient documentation

## 2012-06-29 DIAGNOSIS — Z7982 Long term (current) use of aspirin: Secondary | ICD-10-CM | POA: Insufficient documentation

## 2012-06-29 DIAGNOSIS — Z90710 Acquired absence of both cervix and uterus: Secondary | ICD-10-CM | POA: Insufficient documentation

## 2012-06-29 DIAGNOSIS — Z8679 Personal history of other diseases of the circulatory system: Secondary | ICD-10-CM | POA: Insufficient documentation

## 2012-06-29 DIAGNOSIS — M549 Dorsalgia, unspecified: Secondary | ICD-10-CM | POA: Insufficient documentation

## 2012-06-29 DIAGNOSIS — Z8669 Personal history of other diseases of the nervous system and sense organs: Secondary | ICD-10-CM | POA: Insufficient documentation

## 2012-06-29 DIAGNOSIS — Z86711 Personal history of pulmonary embolism: Secondary | ICD-10-CM | POA: Insufficient documentation

## 2012-06-29 DIAGNOSIS — R197 Diarrhea, unspecified: Secondary | ICD-10-CM | POA: Insufficient documentation

## 2012-06-29 LAB — COMPREHENSIVE METABOLIC PANEL
ALT: 17 U/L (ref 0–35)
AST: 20 U/L (ref 0–37)
Albumin: 3.9 g/dL (ref 3.5–5.2)
Alkaline Phosphatase: 79 U/L (ref 39–117)
Chloride: 100 mEq/L (ref 96–112)
Creatinine, Ser: 0.86 mg/dL (ref 0.50–1.10)
Potassium: 3.7 mEq/L (ref 3.5–5.1)
Sodium: 137 mEq/L (ref 135–145)
Total Bilirubin: 0.3 mg/dL (ref 0.3–1.2)

## 2012-06-29 LAB — CBC WITH DIFFERENTIAL/PLATELET
Basophils Relative: 1 % (ref 0–1)
HCT: 38.8 % (ref 36.0–46.0)
Hemoglobin: 13 g/dL (ref 12.0–15.0)
Lymphocytes Relative: 43 % (ref 12–46)
MCHC: 33.5 g/dL (ref 30.0–36.0)
Monocytes Absolute: 0.6 10*3/uL (ref 0.1–1.0)
Monocytes Relative: 13 % — ABNORMAL HIGH (ref 3–12)
Neutro Abs: 1.8 10*3/uL (ref 1.7–7.7)

## 2012-06-29 LAB — URINALYSIS, ROUTINE W REFLEX MICROSCOPIC
Glucose, UA: NEGATIVE mg/dL
Ketones, ur: NEGATIVE mg/dL
pH: 6 (ref 5.0–8.0)

## 2012-06-29 LAB — URINE MICROSCOPIC-ADD ON

## 2012-06-29 MED ORDER — HYDROCODONE-ACETAMINOPHEN 5-325 MG PO TABS: 2.0000 | ORAL_TABLET | ORAL | Status: DC | PRN

## 2012-06-29 MED ORDER — METHOCARBAMOL 500 MG PO TABS: 500.0000 mg | ORAL_TABLET | Freq: Two times a day (BID) | ORAL | Status: DC

## 2012-06-29 MED ORDER — HYDROMORPHONE HCL PF 1 MG/ML IJ SOLN
1.0000 mg | Freq: Once | INTRAMUSCULAR | Status: AC
  Administered 2012-06-29: 1 mg via INTRAVENOUS
  Filled 2012-06-29: qty 1

## 2012-06-29 MED ORDER — CIPROFLOXACIN HCL 500 MG PO TABS: 500.0000 mg | ORAL_TABLET | Freq: Two times a day (BID) | ORAL | Status: DC

## 2012-06-29 MED ORDER — HYDROMORPHONE HCL PF 1 MG/ML IJ SOLN: 1.0000 mg | Freq: Once | INTRAMUSCULAR | Status: DC

## 2012-06-29 MED ORDER — ONDANSETRON HCL 4 MG/2ML IJ SOLN
4.0000 mg | Freq: Once | INTRAMUSCULAR | Status: AC
  Administered 2012-06-29: 4 mg via INTRAVENOUS
  Filled 2012-06-29: qty 2

## 2012-06-29 MED ORDER — SODIUM CHLORIDE 0.9 % IV BOLUS (SEPSIS)
1000.0000 mL | Freq: Once | INTRAVENOUS | Status: AC
  Administered 2012-06-29: 1000 mL via INTRAVENOUS

## 2012-06-29 MED ORDER — ONDANSETRON 4 MG PO TBDP: ORAL_TABLET | ORAL | Status: DC

## 2012-06-29 NOTE — ED Notes (Signed)
Thursday morning patient began having abdominal pain, became nauseous. Pain radiates from stomach to back. Had diarrhea Thursday night. Hx. Of Nissen

## 2012-06-29 NOTE — ED Notes (Signed)
[id number]<JY> Expected date:<BR> Expected time:<BR> Means of arrival:<BR> Comments:<BR> Hold for triage

## 2012-06-29 NOTE — ED Notes (Signed)
Pt aware of the need for a urine sample. Pt unable to void at this time. 

## 2012-06-29 NOTE — ED Notes (Signed)
Pt states she has been having rt sided abd pain x 3 days.  States she has had a nissen fundoplication and is unable to vomit (5 years ago).  Knee surgery on May 16, 2012.  States she has had this pain every so often.  States that everything has come back negative.  Had this pain 4 x last year.  States it "feels like she slept wrong".

## 2012-06-29 NOTE — ED Notes (Signed)
I attempted to draw blood x1 on this patient, was unsuccessful

## 2012-06-29 NOTE — ED Provider Notes (Signed)
History     CSN: [id number]  Arrival date & time 06/29/12  1118   First MD Initiated Contact with Patient 06/29/12 1249      Chief Complaint  Patient presents with  . Abdominal Pain    (Consider location/radiation/quality/duration/timing/severity/associated sxs/prior treatment) HPI Pt with multiple abd surgeries including appy, chole, Nissen, and abd hysterectomy pt with 3 days of RUQ pain and R flank pain. +associated nausea and diarrhea. No blood in stool. No fever or chills. No SOB or chest pain. Last BM was Thursday. +passing gas. Pt with similar pain in the past with negative workup.  Past Medical History  Diagnosis Date  . Esophageal stenosis     Stricture after fundoplication REDO  . Tinnitus     Subjective  . Mitral valve prolapse   . Irritable bowel syndrome   . GERD (gastroesophageal reflux disease)   . Costochondritis   . OSA (obstructive sleep apnea)     Mild - Sleep study 2004  . Pulmonary embolus 04/2008  . Chronic chest pain   . Dysphasia     Chronic  . Shortness of breath     with exertion  . Hypertension   . Heart murmur   . IBS (irritable bowel syndrome)   . Anemia     Past Surgical History  Procedure Laterality Date  . Brain surgery  1995    Stem surgery, Arnold Chiari Malformation  . Breast reduction surgery    . Appendectomy    . Abdominal hysterectomy    . Nissen fundoplication      X 3 lab, open x 2 last with mesh  . Cholecystectomy  2008  . Hernia repair      Hital Hernia  . Knee arthroscopy with meniscal repair  05/16/2012    left knee  . Knee arthroscopy  05/16/2012    Procedure: ARTHROSCOPY KNEE;  Surgeon: Kennieth Rad, MD;  Location: [facility] OR;  Service: Orthopedics;  Laterality: Left;    Family History  Problem Relation Age of Onset  . Diabetes Mother   . Rheum arthritis Mother   . Hypertension Mother   . Hypertension Father   . Rheum arthritis Sister   . Hypertension Sister   . Colon cancer Maternal Grandmother   . Rheum  arthritis Sister   . Rheum arthritis Sister   . Rheum arthritis Sister     History  Substance Use Topics  . Smoking status: Never Smoker   . Smokeless tobacco: Never Used  . Alcohol Use: No    OB History   Grav Para Term Preterm Abortions TAB SAB Ect Mult Living                  Review of Systems  Constitutional: Negative for fever and chills.  Respiratory: Negative for cough and shortness of breath.   Cardiovascular: Negative for chest pain and palpitations.  Gastrointestinal: Positive for nausea, abdominal pain and diarrhea. Negative for vomiting, blood in stool and abdominal distention.  Genitourinary: Positive for flank pain. Negative for dysuria, frequency and hematuria.  Musculoskeletal: Positive for back pain. Negative for arthralgias.  Skin: Negative for rash and wound.  Neurological: Negative for dizziness, weakness, light-headedness, numbness and headaches.  All other systems reviewed and are negative.    Allergies  Morphine; Promethazine hcl; and Sulfonamide derivatives  Home Medications   Current Outpatient Rx  Name  Route  Sig  Dispense  Refill  . ACIPHEX 20 MG tablet      TAKE ONE  TABLET BY MOUTH TWICE DAILY   60 each   5   . ALPRAZolam (XANAX) 0.25 MG tablet   Oral   Take 1 tablet (0.25 mg total) by mouth every 6 (six) hours as needed.   30 tablet   5   . aspirin 325 MG tablet   Oral   Take 325 mg by mouth daily.           Marland Kitchen etodolac (LODINE XL) 400 MG 24 hr tablet   Oral   Take 400 mg by mouth 2 (two) times daily.         Marland Kitchen HYDROcodone-acetaminophen (NORCO) 10-325 MG per tablet   Oral   Take 1 tablet by mouth every 6 (six) hours as needed for pain.         Marland Kitchen lidocaine (LIDODERM) 5 %   Transdermal   Place 1 patch onto the skin daily. Remove & Discard patch within 12 hours or as directed by MD   30 patch   1   . temazepam (RESTORIL) 30 MG capsule      TAKE ONE CAPSULE BY MOUTH AT BEDTIME   30 capsule   5     PLEASE ADVISE  PATIENT NEEDS YEARLY OFFICE VISIT FO ...   . triamterene-hydrochlorothiazide (MAXZIDE-25) 37.5-25 MG per tablet      TAKE ONE-HALF TABLET BY MOUTH EVERY DAY   30 tablet   2   . HYDROcodone-acetaminophen (NORCO) 5-325 MG per tablet   Oral   Take 2 tablets by mouth every 4 (four) hours as needed for pain.   20 tablet   0   . methocarbamol (ROBAXIN) 500 MG tablet   Oral   Take 1 tablet (500 mg total) by mouth 2 (two) times daily.   20 tablet   0   . ondansetron (ZOFRAN ODT) 4 MG disintegrating tablet      4mg  ODT q4 hours prn nausea/vomit   4 tablet   0     BP 153/81  Pulse 85  Temp(Src) 98.8 F (37.1 C) (Oral)  Resp 20  SpO2 99%  Physical Exam  Nursing note and vitals reviewed. Constitutional: She is oriented to person, place, and time. She appears well-developed and well-nourished. No distress.  HENT:  Head: Normocephalic and atraumatic.  Mouth/Throat: Oropharynx is clear and moist.  Eyes: EOM are normal. Pupils are equal, round, and reactive to light.  Neck: Normal range of motion. Neck supple.  Cardiovascular: Normal rate and regular rhythm.   Pulmonary/Chest: Effort normal and breath sounds normal. No respiratory distress. She has no wheezes. She has no rales. She exhibits no tenderness.  Abdominal: Soft. Bowel sounds are normal. She exhibits no distension and no mass. There is tenderness (TTP RUQ with no rebound or guarding. no epigastric TTP). There is no rebound and no guarding.  Musculoskeletal: Normal range of motion. She exhibits tenderness (TTP over R lumbar paraspinal muscle. ). She exhibits no edema.  Neurological: She is alert and oriented to person, place, and time.  Moves all ext without deficit  Skin: Skin is warm and dry. No rash noted. No erythema.  Psychiatric: She has a normal mood and affect. Her behavior is normal.    ED Course  Procedures (including critical care time)  Labs Reviewed  COMPREHENSIVE METABOLIC PANEL - Abnormal; Notable for  the following:    GFR calc non Af Amer 73 (*)    GFR calc Af Amer 84 (*)    All other components within normal limits  URINALYSIS, ROUTINE W REFLEX MICROSCOPIC - Abnormal; Notable for the following:    APPearance CLOUDY (*)    Bilirubin Urine LARGE (*)    Leukocytes, UA MODERATE (*)    All other components within normal limits  CBC WITH DIFFERENTIAL - Abnormal; Notable for the following:    Neutrophils Relative 40 (*)    Monocytes Relative 13 (*)    All other components within normal limits  URINE MICROSCOPIC-ADD ON - Abnormal; Notable for the following:    Squamous Epithelial / LPF FEW (*)    Bacteria, UA FEW (*)    All other components within normal limits  URINE CULTURE  LIPASE, BLOOD  CBC WITH DIFFERENTIAL   US Abdomen Complete  06/29/2012  *RADIOLOGY REPORT*  Clinical Data:  Right upper quadrant abdominal pain, prior cholecystectomy and appendectomy  COMPLETE ABDOMINAL ULTRASOUND  Comparison:  MRI abdomen dated 02/20/2012  Findings:  Gallbladder:  Surgically absent.  Common bile duct:  Measures 8 mm.  Liver:  Hyperechoic hepatic parenchyma, suggesting hepatic steatosis.  No focal hepatic lesion is seen.  IVC:  Appears normal.  Pancreas:  Not visualized due to overlying bowel gas.  Spleen:  Measures 5.2 cm.  Right Kidney:  Measures 10.9 cm.  No mass or hydronephrosis.  Left Kidney:  Measures 10.6 cm.  No mass or hydronephrosis.  Abdominal aorta:  No aneurysm identified.  IMPRESSION: Suspected hepatic steatosis.  Status post cholecystectomy.   Original Report Authenticated By: Charline Bills, M.D.      1. Abdominal pain       MDM  Pt states she is feeling much better. Reviewed previous records. Symptoms similar to previous presentations and has had extensive workup including MR abd without def diagnosis. Suggest f/u with GI and return for worsening pain, fever, or any concerns.   Pt tolerates norco and dilaudid in the past without complication.       Loren Racer,  MD 06/29/12 [phone]

## 2012-06-29 NOTE — Telephone Encounter (Signed)
Pt called office and states that she has severe pain radiating to her back. Pt states she has had this before but today it is worse.  Pt states she has nausea as well.  Advised pt to go to ER.

## 2012-06-30 ENCOUNTER — Telehealth (HOSPITAL_COMMUNITY): Payer: Self-pay | Admitting: Emergency Medicine

## 2012-06-30 NOTE — ED Notes (Signed)
Patient calling and requesting information about a specific test Dr. Ranae Palms wanted her to ask her gastroenterologist about.

## 2012-07-01 ENCOUNTER — Encounter: Payer: Self-pay | Admitting: Internal Medicine

## 2012-07-01 ENCOUNTER — Ambulatory Visit (INDEPENDENT_AMBULATORY_CARE_PROVIDER_SITE_OTHER): Payer: Federal, State, Local not specified - PPO | Admitting: Internal Medicine

## 2012-07-01 VITALS — BP 130/80 | HR 86 | Temp 98.1°F | Wt 258.0 lb

## 2012-07-01 DIAGNOSIS — I10 Essential (primary) hypertension: Secondary | ICD-10-CM

## 2012-07-01 DIAGNOSIS — R21 Rash and other nonspecific skin eruption: Secondary | ICD-10-CM

## 2012-07-01 DIAGNOSIS — R220 Localized swelling, mass and lump, head: Secondary | ICD-10-CM

## 2012-07-01 DIAGNOSIS — G8929 Other chronic pain: Secondary | ICD-10-CM

## 2012-07-01 DIAGNOSIS — R221 Localized swelling, mass and lump, neck: Secondary | ICD-10-CM

## 2012-07-01 DIAGNOSIS — R1011 Right upper quadrant pain: Secondary | ICD-10-CM

## 2012-07-01 NOTE — Patient Instructions (Addendum)
Right upper quadrant pain - the leading cause is fatty infiltration of the liver - seen on U/S done in the ED. At that visit all labs including liver functions were normal. Your last cholesterol panel in November were great.  Plan Continue the hydrocodone  Loose weight: Diet management: smart food choices, PORTION SIZE CONTROL, regular exercise. Goal - to loose 1-2 lbs.month. Target weight -  Referral to Dr. Leone Payor to help [place] figure out a definitive diagnosis.  UTI - complete the antibiotics  Very prominent pulse in the right neck - will either do a carotid doppler or what the radiologist may recommend to evaluate the origin of the carotid artery.    Fatty Liver Fatty liver is the accumulation of fat in liver cells. It is also called hepatosteatosis or steatohepatitis. It is normal for your liver to contain some fat. If fat is more than 5 to 10% of your liver's weight, you have fatty liver.  There are often no symptoms (problems) for years while damage is still occurring. People often learn about their fatty liver when they have medical tests for other reasons. Fat can damage your liver for years or even decades without causing problems. When it becomes severe, it can cause fatigue, weight loss, weakness, and confusion. This makes you more likely to develop more serious liver problems. The liver is the largest organ in the body. It does a lot of work and often gives no warning signs when it is sick until late in a disease. The liver has many important jobs including:  Breaking down foods.  Storing vitamins, iron, and other minerals.  Making proteins.  Making bile for food digestion.  Breaking down many products including medications, alcohol and some poisons. CAUSES  There are a number of different conditions, medications, and poisons that can cause a fatty liver. Eating too many calories causes fat to build up in the liver. Not processing and breaking fats down normally may also cause  this. Certain conditions, such as obesity, diabetes, and high triglycerides also cause this. Most fatty liver patients tend to be middle-aged and over weight.  Some causes of fatty liver are:  Alcohol over consumption.  Malnutrition.  Steroid use.  Valproic acid toxicity.  Obesity.  Cushing's syndrome.  Poisons.  Tetracycline in high dosages.  Pregnancy.  Diabetes.  Hyperlipidemia.  Rapid weight loss. Some people develop fatty liver even having none of these conditions. SYMPTOMS  Fatty liver most often causes no problems. This is called asymptomatic.  It can be diagnosed with blood tests and also by a liver biopsy.  It is one of the most common causes of minor elevations of liver enzymes on routine blood tests.  Specialized Imaging of the liver using ultrasound, CT (computed tomography) scan, or MRI (magnetic resonance imaging) can suggest a fatty liver but a biopsy is needed to confirm it.  A biopsy involves taking a small sample of liver tissue. This is done by using a needle. It is then looked at under a microscope by a specialist. TREATMENT  It is important to treat the cause. Simple fatty liver without a medical reason may not need treatment.  Weight loss, fat restriction, and exercise in overweight patients produces inconsistent results but is worth trying.  Fatty liver due to alcohol toxicity may not improve even with stopping drinking.  Good control of diabetes may reduce fatty liver.  Lower your triglycerides through diet, medication or both.  Eat a balanced, healthy diet.  Increase your physical activity.  Get regular checkups from a liver specialist.  There are no medical or surgical treatments for a fatty liver or NASH, but improving your diet and increasing your exercise may help prevent or reverse some of the damage. PROGNOSIS  Fatty liver may cause no damage or it can lead to an inflammation of the liver. This is, called steatohepatitis. When it  is linked to alcohol abuse, it is called alcoholic steatohepatitis. It often is not linked to alcohol. It is then called nonalcoholic steatohepatitis, or NASH. Over time the liver may become scarred and hardened. This condition is called cirrhosis. Cirrhosis is serious and may lead to liver failure or cancer. NASH is one of the leading causes of cirrhosis. About 10-20% of Americans have fatty liver and a smaller 2-5% has NASH. Document Released: 05/26/2005 Document Revised: 07/03/2011 Document Reviewed: 07/19/2005 [facility] [employer] Patient Information 2013 [place], [place].

## 2012-07-02 DIAGNOSIS — R1011 Right upper quadrant pain: Secondary | ICD-10-CM | POA: Insufficient documentation

## 2012-07-02 DIAGNOSIS — R221 Localized swelling, mass and lump, neck: Secondary | ICD-10-CM | POA: Insufficient documentation

## 2012-07-02 DIAGNOSIS — G8929 Other chronic pain: Secondary | ICD-10-CM | POA: Insufficient documentation

## 2012-07-02 LAB — URINE CULTURE

## 2012-07-02 NOTE — Assessment & Plan Note (Addendum)
Assessment: pulsatile neck mass of unclear etiology, appears to be arising from right common carotid, clearly visible when pt is sitting comfortably, no bruit heard on auscultation.  Benign large common carotid and right common carotid aneurysm are on the differential.  Plan:  - Consult radiologist for least invasive imaging study to best visualize pt's neck vasculature - U/S neck ordered.

## 2012-07-02 NOTE — Assessment & Plan Note (Signed)
BP Readings from Last 3 Encounters:  07/01/12 130/80  06/29/12 141/76  05/17/12 140/74   Adequate control on present regimen

## 2012-07-02 NOTE — Assessment & Plan Note (Signed)
Assessment: pt has chronic intermittent RUQ abdominal pain that radiates to the back, pt was found to have a UTI in the ED 3/8 and is currently being treated however an US of the abdomen showed signs suggestive of steatohepatosis.  Fatty infiltrate of the liver could explain her pain given that the pain is worse with movement and her abdomen is exquisitely tender to palpation and percussion it could be the result of an irritated liver capsule being manipulated. Plan:  - continue pain control with hydrocodone-acetaminophen - refer pt to GI specialist that has seen her in the past Dr. Leone Payor

## 2012-07-02 NOTE — Progress Notes (Signed)
Subjective:     Patient ID: Jennifer [name], female   DOB: 1950-10-30, 62 y.o.   MRN: [id number]  HPI Pt is a 62 yo AA female with a hx significant for past appendectomy, hysterectomy, Nissen fundoplication, and cholecystectomy that presents with chronic abdominal pain that started shortly after her cholecystectomy in 2008.  The pain is not constant but comes in flares, over the past year the pt has noted 4-5 occurrences of the pain.  The pain is located in the RUQ of the abdomen and radiates to her back.  It is worse with movement and causes her to feel nauseated.  She denies vomiting stating she cannot vomit since her fundoplication.  Pt endorses a change in bowel habits stating there have been times recently when she has had light colored stools.  The pain got so intense Saturday 3/8 that she went to the ED, she had an Korea that suggested steatohepatosis, she was given iv pain meds and a prescription for Norco and told to follow up with her PCP.  Past Medical History  Diagnosis Date  . Esophageal stenosis     Stricture after fundoplication REDO  . Tinnitus     Subjective  . Mitral valve prolapse   . Irritable bowel syndrome   . GERD (gastroesophageal reflux disease)   . Costochondritis   . OSA (obstructive sleep apnea)     Mild - Sleep study 2004  . Pulmonary embolus 04/2008  . Chronic chest pain   . Dysphasia     Chronic  . Shortness of breath     with exertion  . Hypertension   . Heart murmur   . IBS (irritable bowel syndrome)   . Anemia    Past Surgical History  Procedure Laterality Date  . Brain surgery  1995    Stem surgery, Arnold Chiari Malformation  . Breast reduction surgery    . Appendectomy    . Abdominal hysterectomy    . Nissen fundoplication      X 3 lab, open x 2 last with mesh  . Cholecystectomy  2008  . Hernia repair      Hital Hernia  . Knee arthroscopy with meniscal repair  05/16/2012    left knee  . Knee arthroscopy  05/16/2012    Procedure: ARTHROSCOPY  KNEE;  Surgeon: Kennieth Rad, MD;  Location: [facility] OR;  Service: Orthopedics;  Laterality: Left;   Family History  Problem Relation Age of Onset  . Diabetes Mother   . Rheum arthritis Mother   . Hypertension Mother   . Hypertension Father   . Rheum arthritis Sister   . Hypertension Sister   . Colon cancer Maternal Grandmother   . Rheum arthritis Sister   . Rheum arthritis Sister   . Rheum arthritis Sister    History   Social History  . Marital Status: Married    Spouse Name: N/A    Number of Children: N/A  . Years of Education: N/A   Occupational History  . [employer]    Social History Main Topics  . Smoking status: Never Smoker   . Smokeless tobacco: Never Used  . Alcohol Use: No  . Drug Use: No  . Sexually Active: Not on file   Other Topics Concern  . Not on file   Social History Narrative   [employer] Graduate - Technical school   Married '70 - 2 years divorced; married '86 - 2.5 years divorced; married '90 - 1 year divorced; married '00  1 son - '72   Sister - Died @ 84 [place]   Regular Exercise -  NO    Current Outpatient Prescriptions on File Prior to Visit  Medication Sig Dispense Refill  . ALPRAZolam (XANAX) 0.25 MG tablet Take 1 tablet (0.25 mg total) by mouth every 6 (six) hours as needed.  30 tablet  5  . aspirin 325 MG tablet Take 325 mg by mouth daily.        Marland Kitchen HYDROcodone-acetaminophen (NORCO) 10-325 MG per tablet Take 1 tablet by mouth every 6 (six) hours as needed for pain.      Marland Kitchen lidocaine (LIDODERM) 5 % Place 1 patch onto the skin daily. Remove & Discard patch within 12 hours or as directed by MD  30 patch  1  . ondansetron (ZOFRAN ODT) 4 MG disintegrating tablet 4mg  ODT q4 hours prn nausea/vomit  4 tablet  0  . temazepam (RESTORIL) 30 MG capsule TAKE ONE CAPSULE BY MOUTH AT BEDTIME  30 capsule  5  . triamterene-hydrochlorothiazide (MAXZIDE-25) 37.5-25 MG per tablet TAKE ONE-HALF TABLET BY MOUTH EVERY DAY  30 tablet  2   No current  facility-administered medications on file prior to visit.    Review of Systems  Constitutional: Negative for fever and chills.  Respiratory: Negative for cough and shortness of breath.   Cardiovascular: Negative for chest pain.  Gastrointestinal: Positive for nausea and abdominal pain. Negative for vomiting, diarrhea, blood in stool and abdominal distention.       Intermittent light colored stools  Genitourinary: Negative for dysuria.  Musculoskeletal: Positive for back pain (right sided).  Skin: Positive for rash (pt noticed rash on belly after [place] gel was applied saturday).       Objective: .vit   Physical Exam  Vitals reviewed. Constitutional: She appears well-developed and well-nourished. She appears distressed (pt looks uncomfortable in her chair).  HENT:  Head: Normocephalic and atraumatic.  Eyes: Right eye exhibits no discharge. Left eye exhibits no discharge. No scleral icterus.  Neck:  Pulsatile vessel/mass noted on the right anterior neck in the area of the common carotid, no bruit appreciated on auscultation  Cardiovascular: Normal rate, regular rhythm and normal heart sounds.  Exam reveals no friction rub.   No murmur heard. Pulmonary/Chest: Effort normal and breath sounds normal. No respiratory distress. She has no wheezes. She has no rales.  Abdominal: Soft. Bowel sounds are normal. She exhibits no distension and no mass. There is tenderness (RUQ). There is guarding (voluntary).  Neurological: She is alert.  Skin: Skin is warm and dry.    Filed Vitals:   07/01/12 1706  BP: 130/80  Pulse: 86  Temp: 98.1 F (36.7 C)           Assessment/Plan:       I personally interviewed and examined Ms. Schweiger. I agree with the assessment and plan as outline by Mr. Lucretia Roers, MSIII.

## 2012-07-03 ENCOUNTER — Telehealth: Payer: Self-pay | Admitting: Internal Medicine

## 2012-07-03 NOTE — ED Notes (Signed)
+   urine Chart appended per protocol MD. 

## 2012-07-03 NOTE — Telephone Encounter (Signed)
Caller: Wajiha/Patient; Phone: [phone]; Reason for Call: Patient is calling, states she was seen in office 07/01/12.  Patient states she had a rash on her right abdomen.  Patient states pain and rash increased and topical Hydrocortisone cream was not effective.  Patient states she was seen by her Dermatologist, Dr.  Arminda Resides, at [facility] 07/03/12 and Diagnosed with shingles.  Patient states she was prescribed Valtrex 1 gram TID X 1 week and Ibuprofen 800mg .  BID with food. Patient states she is calling to inform Dr. Debby Bud of above.  Patient also inquiring if she should still proceed with having the ultrasound done.  RN reviewed Epic Electronic Health Record and noted that an Ultrasound of the soft tissue of head and neck was ordered related to pulsatile neck mass.  Patient advised to proceed with ultrasound as ordered.  Patient verbalizes understanding and agreeable.  Patient had generalized questions regarding shingles, causes, contagiousness and vaccine.  Patient provided with general information from [employer].

## 2012-07-04 ENCOUNTER — Other Ambulatory Visit: Payer: Self-pay | Admitting: Internal Medicine

## 2012-07-04 ENCOUNTER — Other Ambulatory Visit: Payer: Federal, State, Local not specified - PPO

## 2012-07-05 NOTE — Telephone Encounter (Signed)
Temazepam called in to [facility] on [address] [phone]

## 2012-07-09 ENCOUNTER — Ambulatory Visit
Admission: RE | Admit: 2012-07-09 | Discharge: 2012-07-09 | Disposition: A | Discharge status: Home / Self Care | Payer: Federal, State, Local not specified - PPO | Source: Ambulatory Visit | Attending: Internal Medicine | Admitting: Internal Medicine

## 2012-07-09 ENCOUNTER — Encounter: Payer: Self-pay | Admitting: Internal Medicine

## 2012-07-09 ENCOUNTER — Other Ambulatory Visit: Payer: Federal, State, Local not specified - PPO

## 2012-07-09 DIAGNOSIS — R221 Localized swelling, mass and lump, neck: Secondary | ICD-10-CM

## 2012-07-22 ENCOUNTER — Telehealth: Payer: Self-pay | Admitting: Internal Medicine

## 2012-07-22 NOTE — Telephone Encounter (Signed)
Scan was normal without mass or nodules. Report to be sent if not already sent via MyChart.  The tech was speaking beyond her paygrade and should be reprimanded: it is inappropriate to make the comment she made.

## 2012-07-22 NOTE — Telephone Encounter (Signed)
Pt will check My Chart and make an appt is she has questions.

## 2012-07-22 NOTE — Telephone Encounter (Signed)
Pt is requesting results of the Korea on her neck.  She was told she may need a thyroid bx by the Korea tech.

## 2012-08-02 ENCOUNTER — Encounter: Payer: Self-pay | Admitting: Internal Medicine

## 2012-08-02 ENCOUNTER — Ambulatory Visit (INDEPENDENT_AMBULATORY_CARE_PROVIDER_SITE_OTHER): Payer: Federal, State, Local not specified - PPO | Admitting: Internal Medicine

## 2012-08-02 VITALS — BP 128/74 | HR 85 | Ht 68.5 in | Wt 259.0 lb

## 2012-08-02 DIAGNOSIS — G8929 Other chronic pain: Secondary | ICD-10-CM

## 2012-08-02 DIAGNOSIS — R1319 Other dysphagia: Secondary | ICD-10-CM

## 2012-08-02 DIAGNOSIS — K648 Other hemorrhoids: Secondary | ICD-10-CM

## 2012-08-02 DIAGNOSIS — R1310 Dysphagia, unspecified: Secondary | ICD-10-CM

## 2012-08-02 DIAGNOSIS — R1011 Right upper quadrant pain: Secondary | ICD-10-CM

## 2012-08-02 DIAGNOSIS — R1314 Dysphagia, pharyngoesophageal phase: Secondary | ICD-10-CM

## 2012-08-02 DIAGNOSIS — B029 Zoster without complications: Secondary | ICD-10-CM

## 2012-08-02 MED ORDER — HYDROCORTISONE ACETATE 25 MG RE SUPP: 25.0000 mg | Freq: Every day | RECTAL | Status: DC

## 2012-08-02 MED ORDER — DICLOFENAC SODIUM 1 % TD GEL: 4.0000 g | Freq: Four times a day (QID) | TRANSDERMAL | Status: DC

## 2012-08-02 NOTE — Patient Instructions (Addendum)
We have sent medications to your pharmacy for you to pick up at your convenience.  We are giving you a printed rx for Voltaen Gel.  Use the dysphagia diet we have given you today, experiment with which level you can use.  Today we are giving you a handout on fatty liver to read.  Try and decrease your Aciphex to once a day.  Thank you for choosing me and [facility].  Iva Boop, M.D., [facility]

## 2012-08-02 NOTE — Progress Notes (Signed)
  Subjective:    Patient ID: Jennifer [name], female    DOB: July 24, 1950, 62 y.o.   MRN: [id number]  HPI This very nice lady presents in followup because of right upper quadrant pain. She's had years of intermittent right upper quadrant pain that might be sharp. More recently, in early March she had acute severe pain. She was evaluated in the emergency department and her LFTs were normal. Abdominal ultrasound showed suspected steatosis, postcholecystectomy bile duct size of 8 mm. Next day or 2 days later she saw Dr. Debby Bud and she was starting to have some sort of her rash that they thought might be from the gel used at the ultrasound, subsequently she developed vesicles, saw her dermatologist and was diagnosed with shingles. She was prescribed antiviral agent and that has resolved and the pain is much better though she still has some pain there.  Continues to have chronic dysphagia problems.  He also is complaining of some intermittent rectal bleeding particularly with large hard stools, she might have problems with painful defecation as well.  Medications, allergies, past medical history, past surgical history, family history and social history are reviewed and updated in the EMR.  Review of Systems Gained some weight due to recent knee problems and inactivity    Objective:   Physical Exam General:  NAD Eyes:   anicteric Lungs:  clear Heart:  S1S2 no rubs, murmurs or gallops Abdomen:  Soft, tender in right upper quadrant which is worse or only with muscle tension. There are hyperpigmented areas in a dermatomal distribution there from prior rash. Rectal exam with female staff present is mildly tender somewhat diffusely. Anoscopy is performed demonstrating internal hemorrhoids, I do not see an obvious fissure   Data Reviewed:  Emergency room note primary care notes from 2014 Ultrasound report Lab Results  Component Value Date   ALT 17 06/29/2012   AST 20 06/29/2012   ALKPHOS 79 06/29/2012   BILITOT 0.3 06/29/2012          Assessment & Plan:   1. Chronic RUQ pain - hepatic steatosis might be part of this, she clearly has musculoskeletal abdominal wall pain right now   2. Shingles resolved, question some component of postherpetic neuralgia though seems doubtful   3. Esophageal dysphagia chronic from prior fundoplication this, 3 in total   4. Hemorrhoids, internal, with bleeding    1.  Voltaren Gel for the right upper quadrant pain 2. Dysphagia diet modification is discussed with the patient in a handout given, she will try to modify the size and texture of foods to minimize dysphagia 3. She may reduce AcipHex to once a day to see if she can tolerate that 4. Anusol-HC suppositories for her hemorrhoids 5.  fatty liver handout provided to the patient she will continue to try to lose weight she is going back with a personal trainer once knee is improved 6. She will call Dr. Debby Bud regarding possible shingles vaccine   I appreciate the opportunity to care for this patient.   CC: Illene Regulus, MD

## 2012-08-07 ENCOUNTER — Other Ambulatory Visit: Payer: Self-pay | Admitting: Internal Medicine

## 2012-08-07 ENCOUNTER — Telehealth: Payer: Self-pay | Admitting: Internal Medicine

## 2012-08-07 DIAGNOSIS — Z124 Encounter for screening for malignant neoplasm of cervix: Secondary | ICD-10-CM

## 2012-08-07 NOTE — Telephone Encounter (Signed)
Temazepam called to pharmacy  

## 2012-08-07 NOTE — Telephone Encounter (Signed)
Patient sent a message through [facility] requesting a referral to OBGYN to have a pap, contact patient once a visit has been scheduled

## 2012-08-07 NOTE — Telephone Encounter (Signed)
Order done - thanks

## 2012-08-07 NOTE — Telephone Encounter (Signed)
Please advise 

## 2012-08-08 ENCOUNTER — Encounter: Payer: Self-pay | Admitting: Obstetrics & Gynecology

## 2012-08-23 ENCOUNTER — Ambulatory Visit (INDEPENDENT_AMBULATORY_CARE_PROVIDER_SITE_OTHER): Payer: Federal, State, Local not specified - PPO

## 2012-08-23 DIAGNOSIS — Z23 Encounter for immunization: Secondary | ICD-10-CM

## 2012-09-02 ENCOUNTER — Ambulatory Visit (INDEPENDENT_AMBULATORY_CARE_PROVIDER_SITE_OTHER): Payer: Federal, State, Local not specified - PPO | Admitting: Obstetrics & Gynecology

## 2012-09-02 ENCOUNTER — Encounter: Payer: Self-pay | Admitting: Obstetrics & Gynecology

## 2012-09-02 VITALS — BP 125/81 | HR 92 | Temp 98.0°F | Ht 68.0 in | Wt 261.0 lb

## 2012-09-02 DIAGNOSIS — Z01419 Encounter for gynecological examination (general) (routine) without abnormal findings: Secondary | ICD-10-CM

## 2012-09-02 DIAGNOSIS — Z9079 Acquired absence of other genital organ(s): Secondary | ICD-10-CM

## 2012-09-02 NOTE — Progress Notes (Signed)
Subjective:    Jennifer [name] is a 62 y.o. female who presents for an annual exam. The patient has no complaints today. The patient is sexually active. GYN screening history: last pap: was normal. The patient wears seatbelts: yes. The patient participates in regular exercise: not asked. Has the patient ever been transfused or tattooed?: no. The patient reports that there is not domestic violence in her life.   Menstrual History: OB History   Grav Para Term Preterm Abortions TAB SAB Ect Mult Living   1 1 1  0 0 0 0 0 0 1      Past Medical History  Diagnosis Date  . Esophageal stenosis     Stricture after fundoplication REDO  . Tinnitus     Subjective  . Mitral valve prolapse   . GERD (gastroesophageal reflux disease)   . Costochondritis   . OSA (obstructive sleep apnea)     Mild - Sleep study 2004  . Pulmonary embolus 04/2008  . Chronic chest pain   . Dysphasia     Chronic  . Shortness of breath     with exertion  . Hypertension   . Heart murmur   . IBS (irritable bowel syndrome)   . Anemia   . Colon polyps     inflamed partially ulcerated hyperplastic polyp  . Diverticulosis   . Hemorrhoids   . Zoster    Past Surgical History  Procedure Laterality Date  . Brain surgery  1995    Stem surgery, Arnold Chiari Malformation  . Breast reduction surgery    . Appendectomy    . Abdominal hysterectomy    . Nissen fundoplication      X 3 lab, open x 2 last with mesh  . Cholecystectomy  2008  . Hernia repair      Hital Hernia  . Knee arthroscopy  05/16/2012    Procedure: ARTHROSCOPY KNEE;  Surgeon: Kennieth Rad, MD;  Location: [facility] OR;  Service: Orthopedics;  Laterality: Left;  . Colonoscopy    . Esophagogastroduodenoscopy     Allergies  Allergen Reactions  . Morphine Anaphylaxis  . Promethazine Hcl     Hallucinations  . Sulfonamide Derivatives Hives   Current Outpatient Prescriptions on File Prior to Visit  Medication Sig Dispense Refill  . ALPRAZolam (XANAX) 0.25 MG  tablet Take 1 tablet (0.25 mg total) by mouth every 6 (six) hours as needed.  30 tablet  5  . aspirin 325 MG tablet Take 325 mg by mouth daily.        . diclofenac sodium (VOLTAREN) 1 % GEL Apply 4 g topically 4 (four) times daily.  100 g  1  . hydrocortisone (ANUSOL-HC) 25 MG suppository Place 1 suppository (25 mg total) rectally at bedtime. For 5-7 nights then as needed  24 suppository  0  . lidocaine (LIDODERM) 5 % Place 1 patch onto the skin daily. Remove & Discard patch within 12 hours or as directed by MD  30 patch  1  . ondansetron (ZOFRAN ODT) 4 MG disintegrating tablet 4mg  ODT q4 hours prn nausea/vomit  4 tablet  0  . RABEprazole (ACIPHEX) 20 MG tablet Take 20 mg by mouth daily.      . temazepam (RESTORIL) 30 MG capsule TAKE ONE CAPSULE BY MOUTH AT BEDTIME  30 capsule  0  . triamterene-hydrochlorothiazide (MAXZIDE-25) 37.5-25 MG per tablet TAKE ONE-HALF TABLET BY MOUTH EVERY DAY  30 tablet  2   No current facility-administered medications on file prior to visit.  Menarche age:  No LMP recorded. Patient has had a hysterectomy.  Age 59 Dr. Jennette Kettle  The following portions of the patient's history were reviewed and updated as appropriate: allergies, current medications, past family history, past medical history, past social history, past surgical history and problem list.  Review of Systems Genitourinary:positive for urinary incontinence Mild urge Sx   Objective:    BP 125/81  Pulse 92  Temp(Src) 98 F (36.7 C) (Oral)  Ht 5\' 8"  (1.727 m)  Wt 261 lb (118.389 kg)  BMI 39.69 kg/m2  General Appearance:    Alert, cooperative, no distress, appears stated age  Head:    Normocephalic, without obvious abnormality, atraumatic           Throat:   Lips, mucosa, and tongue normal; teeth and gums normal  Neck:   Supple, symmetrical, trachea midline, no adenopathy;    thyroid:  no enlargement/tenderness/nodules; no carotid   bruit or JVD  Back:     Symmetric, no curvature, ROM normal,  no CVA tenderness  Lungs:     Clear to auscultation bilaterally, respirations unlabored  Chest Wall:    No tenderness or deformity   Heart:    Regular rate and rhythm, S1 and S2 normal, no murmur, rub   or gallop  Breast Exam:    No tenderness, masses, or nipple abnormality Scars from reduction surgery  Abdomen:     Soft, non-tender, bowel sounds active all four quadrants,    no masses, no organomegaly  Genitalia:    Normal female without lesion, discharge or tenderness Cuff intact, mild atropy, cx absent, no mass  Rectal:    Extremities:   Extremities normal, atraumatic, no cyanosis or edema  Pulses:   2+ and symmetric all extremities  Skin:   Skin color, texture, turgor normal, no rashes or lesions  Lymph nodes:   Cervical, supraclavicular, and axillary nodes normal     .    Assessment:    Healthy female exam.    Plan:     All questions answered. Mammogram. Yearly No need for pap.  Adam Phenix, MD 09/02/2012

## 2012-09-02 NOTE — Patient Instructions (Signed)
Mammography Mammography is an X-ray of the breasts to look for changes that are not normal. The X-ray image is called a mammogram. This procedure can screen for breast cancer, can detect cancer early, and can diagnose cancer.  LET YOUR CAREGIVER KNOW ABOUT:  Breast implants.  Previous breast disease, biopsy, or surgery.  If you are breastfeeding.  Medicines taken, including vitamins, herbs, eyedrops, over-the-counter medicines, and creams.  Use of steroids (by mouth or creams).  Possibility of pregnancy, if this applies. RISKS AND COMPLICATIONS  Exposure to radiation, but at very low levels.  The results may be misinterpreted.  The results may not be accurate.  Mammography may lead to further tests.  Mammography may not catch certain cancers. BEFORE THE PROCEDURE  Schedule your test about 7 days after your menstrual period. This is when your breasts are the least tender and have signs of hormone changes.  If you have had a mammography done at a different facility in the past, get the mammogram X-rays or have them sent to your current exam facility in order to compare them.  Wash your breasts and under your arms the day of the test.  Do not wear deodorants, perfumes, or powders anywhere on your body.  Wear clothes that you can change in and out of easily. PROCEDURE Relax as much as possible during the test. Any discomfort during the test will be very brief. The test should take less than 30 minutes. The following will happen:  You will undress from the waist up and put on a gown.  You will stand in front of the X-ray machine.  Each breast will be placed between 2 plastic or glass plates. The plates will compress your breast for a few seconds.  X-rays will be taken from different angles of the breast. AFTER THE PROCEDURE  The mammogram will be examined.  Depending on the quality of the images, you may need to repeat certain parts of the test.  Ask when your test  results will be ready. Make sure you get your test results.  You may resume normal activities. Document Released: 04/07/2000 Document Revised: 07/03/2011 Document Reviewed: 01/29/2011 [facility] Patient Information 2013 [place], [place].

## 2012-09-06 ENCOUNTER — Other Ambulatory Visit: Payer: Self-pay | Admitting: Internal Medicine

## 2012-09-06 NOTE — Telephone Encounter (Signed)
Done hardcopy to robin  Ultrasound mar 2014 was normal, only normal blood vessels seen, no biopsy thyroid would be indicated  Technicians are not supposed to discuss preliminary findings with patient as this can cause confusion

## 2012-09-06 NOTE — Telephone Encounter (Signed)
Faxed script back to [facility]...lmb

## 2012-09-06 NOTE — Telephone Encounter (Signed)
Left msg on triage requesting call abck concerning test results. Called pt back she states that md never went over thyroid u/s results that was done. Was told by technician that she has a nodule & md would have to get a biopsy done. Pt states she received results back from test when he was checking arteries on mychart. Inform pt to make a f/u appt with md ...lmb

## 2012-09-10 ENCOUNTER — Telehealth: Payer: Self-pay | Admitting: Internal Medicine

## 2012-09-10 ENCOUNTER — Telehealth: Payer: Self-pay

## 2012-09-10 DIAGNOSIS — E041 Nontoxic single thyroid nodule: Secondary | ICD-10-CM

## 2012-09-10 NOTE — Telephone Encounter (Signed)
Pt calls regarding the u/s she had done March 18. She asks was there anything in the report stating a oversized thyroid nodule. I let her know the report does not state anything specific to the thyroid. She says she will pick up that report from [facility] imaging and bring it to her appt she has coming up with Dr Debby Bud. She says during the u/s the tech mentioned about her thyroid and a report will be sent to her doctor.

## 2012-09-10 NOTE — Telephone Encounter (Signed)
Called left message to call back 

## 2012-09-10 NOTE — Telephone Encounter (Signed)
Received call per radiology who reviewed again the mar 2014 u/s thyroid with nodule , has increased size and to consider biopsy  OK for biopsy - I ordered

## 2012-09-11 NOTE — Telephone Encounter (Signed)
Patient informed of results and biopsy that has been ordered.  Informed [facility] of patients work number to be contacted on [phone].

## 2012-09-13 ENCOUNTER — Ambulatory Visit: Payer: Federal, State, Local not specified - PPO | Admitting: Internal Medicine

## 2012-09-18 ENCOUNTER — Other Ambulatory Visit (HOSPITAL_COMMUNITY)
Admission: RE | Admit: 2012-09-18 | Discharge: 2012-09-18 | Disposition: A | Discharge status: Home / Self Care | Payer: Federal, State, Local not specified - PPO | Source: Ambulatory Visit | Attending: Interventional Radiology | Admitting: Interventional Radiology

## 2012-09-18 ENCOUNTER — Ambulatory Visit
Admission: RE | Admit: 2012-09-18 | Discharge: 2012-09-18 | Disposition: A | Discharge status: Home / Self Care | Payer: Federal, State, Local not specified - PPO | Source: Ambulatory Visit | Attending: Internal Medicine | Admitting: Internal Medicine

## 2012-09-18 DIAGNOSIS — E041 Nontoxic single thyroid nodule: Secondary | ICD-10-CM

## 2012-09-18 DIAGNOSIS — E049 Nontoxic goiter, unspecified: Secondary | ICD-10-CM | POA: Insufficient documentation

## 2012-09-26 ENCOUNTER — Other Ambulatory Visit: Payer: Self-pay | Admitting: Internal Medicine

## 2012-09-30 ENCOUNTER — Ambulatory Visit (INDEPENDENT_AMBULATORY_CARE_PROVIDER_SITE_OTHER): Payer: Federal, State, Local not specified - PPO | Admitting: Internal Medicine

## 2012-09-30 ENCOUNTER — Encounter: Payer: Self-pay | Admitting: Internal Medicine

## 2012-09-30 VITALS — BP 128/70 | HR 87 | Temp 100.2°F | Ht 68.5 in | Wt 259.4 lb

## 2012-09-30 DIAGNOSIS — J209 Acute bronchitis, unspecified: Secondary | ICD-10-CM

## 2012-09-30 DIAGNOSIS — R062 Wheezing: Secondary | ICD-10-CM

## 2012-09-30 DIAGNOSIS — I10 Essential (primary) hypertension: Secondary | ICD-10-CM

## 2012-09-30 DIAGNOSIS — E049 Nontoxic goiter, unspecified: Secondary | ICD-10-CM

## 2012-09-30 MED ORDER — PREDNISONE 10 MG PO TABS: ORAL_TABLET | ORAL | Status: DC

## 2012-09-30 MED ORDER — AZITHROMYCIN 250 MG PO TABS: ORAL_TABLET | ORAL | Status: DC

## 2012-09-30 MED ORDER — METHYLPREDNISOLONE ACETATE 80 MG/ML IJ SUSP
80.0000 mg | Freq: Once | INTRAMUSCULAR | Status: AC
  Administered 2012-09-30: 80 mg via INTRAMUSCULAR

## 2012-09-30 MED ORDER — HYDROCODONE-HOMATROPINE 5-1.5 MG/5ML PO SYRP: 5.0000 mL | ORAL_SOLUTION | Freq: Four times a day (QID) | ORAL | Status: DC | PRN

## 2012-09-30 NOTE — Patient Instructions (Signed)
You had the steroid shot today Please take all new medication as prescribed  - the antibiotic, and short course of low dose prednisone Please continue all other medications as before Your thyroid biopsy did not show cancer, and you have the pathology report Please keep your appointments with your PCP as you have planned for further discussion regarding the thyroid

## 2012-09-30 NOTE — Progress Notes (Signed)
Subjective:    Patient ID: Jennifer [name], female    DOB: Apr 30, 1950, 62 y.o.   MRN: [id number]  HPI  Here with acute onset mild to mod 2-3 days ST, HA, general weakness and malaise, with prod cough greenish sputum, but Pt denies chest pain, increased sob or doe, wheezing, orthopnea, PND, increased LE swelling, palpitations, dizziness or syncope except for onset mild wheezing/sob last night.  Pt denies new neurological symptoms such as new headache, or facial or extremity weakness or numbness   Pt denies polydipsia, polyuria.Very concerned about recent thyroid biopsy, asks for results Past Medical History  Diagnosis Date  . Esophageal stenosis     Stricture after fundoplication REDO  . Tinnitus     Subjective  . Mitral valve prolapse   . GERD (gastroesophageal reflux disease)   . Costochondritis   . OSA (obstructive sleep apnea)     Mild - Sleep study 2004  . Pulmonary embolus 04/2008  . Chronic chest pain   . Dysphasia     Chronic  . Shortness of breath     with exertion  . Hypertension   . Heart murmur   . IBS (irritable bowel syndrome)   . Anemia   . Colon polyps     inflamed partially ulcerated hyperplastic polyp  . Diverticulosis   . Hemorrhoids   . Zoster    Past Surgical History  Procedure Laterality Date  . Brain surgery  1995    Stem surgery, Arnold Chiari Malformation  . Breast reduction surgery    . Appendectomy    . Abdominal hysterectomy    . Nissen fundoplication      X 3 lab, open x 2 last with mesh  . Cholecystectomy  2008  . Hernia repair      Hital Hernia  . Knee arthroscopy  05/16/2012    Procedure: ARTHROSCOPY KNEE;  Surgeon: Kennieth Rad, MD;  Location: [facility] OR;  Service: Orthopedics;  Laterality: Left;  . Colonoscopy    . Esophagogastroduodenoscopy      reports that she has never smoked. She has never used smokeless tobacco. She reports that she does not drink alcohol or use illicit drugs. family history includes Colon cancer in her maternal  grandmother; Diabetes in her mother; Hypertension in her father, mother, and sister; and Rheum arthritis in her mother and sisters. Allergies  Allergen Reactions  . Morphine Anaphylaxis  . Promethazine Hcl     Hallucinations  . Sulfonamide Derivatives Hives   Current Outpatient Prescriptions on File Prior to Visit  Medication Sig Dispense Refill  . ALPRAZolam (XANAX) 0.25 MG tablet Take 1 tablet (0.25 mg total) by mouth every 6 (six) hours as needed.  30 tablet  5  . aspirin 325 MG tablet Take 325 mg by mouth daily.        . diclofenac sodium (VOLTAREN) 1 % GEL Apply 4 g topically 4 (four) times daily.  100 g  1  . hydrocortisone (ANUSOL-HC) 25 MG suppository Place 1 suppository (25 mg total) rectally at bedtime. For 5-7 nights then as needed  24 suppository  0  . lidocaine (LIDODERM) 5 % Place 1 patch onto the skin daily. Remove & Discard patch within 12 hours or as directed by MD  30 patch  1  . ondansetron (ZOFRAN ODT) 4 MG disintegrating tablet 4mg  ODT q4 hours prn nausea/vomit  4 tablet  0  . RABEprazole (ACIPHEX) 20 MG tablet TAKE ONE TABLET BY MOUTH TWICE DAILY  60 tablet  6  . temazepam (RESTORIL) 30 MG capsule TAKE ONE CAPSULE BY MOUTH AT BEDTIME  30 capsule  1  . triamterene-hydrochlorothiazide (MAXZIDE-25) 37.5-25 MG per tablet TAKE ONE-HALF TABLET BY MOUTH EVERY DAY  30 tablet  2   No current facility-administered medications on file prior to visit.   Review of Systems  Constitutional: Negative for unexpected weight change, or unusual diaphoresis  HENT: Negative for tinnitus.   Eyes: Negative for photophobia and visual disturbance.  Respiratory: Negative for choking and stridor.   Gastrointestinal: Negative for vomiting and blood in stool.  Genitourinary: Negative for hematuria and decreased urine volume.  Musculoskeletal: Negative for acute joint swelling Skin: Negative for color change and wound.  Neurological: Negative for tremors and numbness other than noted   Psychiatric/Behavioral: Negative for decreased concentration or  hyperactivity.       Objective:   Physical Exam BP 128/70  Pulse 87  Temp(Src) 100.2 F (37.9 C) (Oral)  Ht 5' 8.5" (1.74 m)  Wt 259 lb 6 oz (117.652 kg)  BMI 38.86 kg/m2  SpO2 94% VS noted,  Constitutional: Pt appears well-developed and well-nourished.  HENT: Head: NCAT.  Right Ear: External ear normal.  Left Ear: External ear normal.  Bilat tm's with mild erythema.  Max sinus areas mild tender.  Pharynx with mild erythema, no exudate Eyes: Conjunctivae and EOM are normal. Pupils are equal, round, and reactive to light.  Neck: Normal range of motion. Neck supple.  Cardiovascular: Normal rate and regular rhythm.   Pulmonary/Chest: Effort normal and breath sounds decreased with few bilat wheezes  Neurological: Pt is alert. Not confused  Skin: Skin is warm. No erythema.  Psychiatric: Pt behavior is normal. Thought content normal.     Assessment & Plan:

## 2012-10-02 NOTE — Assessment & Plan Note (Signed)
Mild to mod, for antibx course,  to f/u any worsening symptoms or concerns 

## 2012-10-02 NOTE — Assessment & Plan Note (Signed)
D/w pt - recent thyroid biopsy neg for malignancy, gave copy of path report

## 2012-10-02 NOTE — Assessment & Plan Note (Signed)
stable overall by history and exam, recent data reviewed with pt, and pt to continue medical treatment as before,  to f/u any worsening symptoms or concerns BP Readings from Last 3 Encounters:  09/30/12 128/70  09/02/12 125/81  08/02/12 128/74

## 2012-10-02 NOTE — Assessment & Plan Note (Signed)
Mild to mod, for predpack asd,  to f/u any worsening symptoms or concerns 

## 2012-10-03 ENCOUNTER — Encounter: Payer: Self-pay | Admitting: Internal Medicine

## 2012-10-03 ENCOUNTER — Ambulatory Visit (INDEPENDENT_AMBULATORY_CARE_PROVIDER_SITE_OTHER): Payer: Federal, State, Local not specified - PPO | Admitting: Internal Medicine

## 2012-10-03 VITALS — BP 132/80 | HR 75 | Temp 98.0°F | Ht 68.5 in | Wt 261.0 lb

## 2012-10-03 DIAGNOSIS — E049 Nontoxic goiter, unspecified: Secondary | ICD-10-CM

## 2012-10-03 DIAGNOSIS — I10 Essential (primary) hypertension: Secondary | ICD-10-CM

## 2012-10-03 DIAGNOSIS — Z23 Encounter for immunization: Secondary | ICD-10-CM

## 2012-10-03 DIAGNOSIS — E663 Overweight: Secondary | ICD-10-CM

## 2012-10-03 DIAGNOSIS — Z2911 Encounter for prophylactic immunotherapy for respiratory syncytial virus (RSV): Secondary | ICD-10-CM

## 2012-10-03 MED ORDER — HYDROCODONE-HOMATROPINE 5-1.5 MG/5ML PO SYRP: 5.0000 mL | ORAL_SOLUTION | Freq: Four times a day (QID) | ORAL | Status: DC | PRN

## 2012-10-03 NOTE — Patient Instructions (Addendum)
Thyroid - a non-malignant goiter left lobe of the thyroid. This will not go away and may enlarge. Recommend a repeat ultra-sound in 1 year.  Bronchitis - to help with mucus production take Mucinex 1200 mg twice a day. Continue the cough syrup.

## 2012-10-04 NOTE — Progress Notes (Signed)
Subjective:    Patient ID: Jennifer [name], female    DOB: 08/06/1950, 62 y.o.   MRN: [id number]  HPI Jennifer [name] presents to discuss results of thyroid biopsy. She was found in March '14 to have a left lobe nodule. Biopsy was done May 28th - path reports out as benign goiter.  She had a recent bout of bronchitis, Dr. Raphael Gibney note reviewed, and she has made an uneventful recovery.  Past Medical History  Diagnosis Date  . Esophageal stenosis     Stricture after fundoplication REDO  . Tinnitus     Subjective  . Mitral valve prolapse   . GERD (gastroesophageal reflux disease)   . Costochondritis   . OSA (obstructive sleep apnea)     Mild - Sleep study 2004  . Pulmonary embolus 04/2008  . Chronic chest pain   . Dysphasia     Chronic  . Shortness of breath     with exertion  . Hypertension   . Heart murmur   . IBS (irritable bowel syndrome)   . Anemia   . Colon polyps     inflamed partially ulcerated hyperplastic polyp  . Diverticulosis   . Hemorrhoids   . Zoster    Past Surgical History  Procedure Laterality Date  . Brain surgery  1995    Stem surgery, Arnold Chiari Malformation  . Breast reduction surgery    . Appendectomy    . Abdominal hysterectomy    . Nissen fundoplication      X 3 lab, open x 2 last with mesh  . Cholecystectomy  2008  . Hernia repair      Hital Hernia  . Knee arthroscopy  05/16/2012    Procedure: ARTHROSCOPY KNEE;  Surgeon: Kennieth Rad, MD;  Location: [facility] OR;  Service: Orthopedics;  Laterality: Left;  . Colonoscopy    . Esophagogastroduodenoscopy     Family History  Problem Relation Age of Onset  . Diabetes Mother   . Rheum arthritis Mother   . Hypertension Mother   . Hypertension Father   . Rheum arthritis Sister   . Hypertension Sister   . Colon cancer Maternal Grandmother   . Rheum arthritis Sister   . Rheum arthritis Sister   . Rheum arthritis Sister    History   Social History  . Marital Status: Married    Spouse Name: N/A   Number of Children: N/A  . Years of Education: N/A   Occupational History  . [employer]    Social History Main Topics  . Smoking status: Never Smoker   . Smokeless tobacco: Never Used  . Alcohol Use: No  . Drug Use: No  . Sexually Active: Yes    Birth Control/ Protection: None   Other Topics Concern  . Not on file   Social History Narrative   [employer] Graduate - Technical school   Married '70 - 2 years divorced; married '86 - 2.5 years divorced; married '90 - 1 year divorced; married '00   1 son - '17   Sister - Died @ 53 [place]   Regular Exercise -  NO    Current Outpatient Prescriptions on File Prior to Visit  Medication Sig Dispense Refill  . ALPRAZolam (XANAX) 0.25 MG tablet Take 1 tablet (0.25 mg total) by mouth every 6 (six) hours as needed.  30 tablet  5  . aspirin 325 MG tablet Take 325 mg by mouth daily.        . diclofenac sodium (VOLTAREN) 1 %  GEL Apply 4 g topically 4 (four) times daily.  100 g  1  . hydrocortisone (ANUSOL-HC) 25 MG suppository Place 1 suppository (25 mg total) rectally at bedtime. For 5-7 nights then as needed  24 suppository  0  . lidocaine (LIDODERM) 5 % Place 1 patch onto the skin daily. Remove & Discard patch within 12 hours or as directed by MD  30 patch  1  . ondansetron (ZOFRAN ODT) 4 MG disintegrating tablet 4mg  ODT q4 hours prn nausea/vomit  4 tablet  0  . predniSONE (DELTASONE) 10 MG tablet 2 tabs per day for 7 days  14 tablet  0  . RABEprazole (ACIPHEX) 20 MG tablet TAKE ONE TABLET BY MOUTH TWICE DAILY  60 tablet  6  . temazepam (RESTORIL) 30 MG capsule TAKE ONE CAPSULE BY MOUTH AT BEDTIME  30 capsule  1  . triamterene-hydrochlorothiazide (MAXZIDE-25) 37.5-25 MG per tablet TAKE ONE-HALF TABLET BY MOUTH EVERY DAY  30 tablet  2   No current facility-administered medications on file prior to visit.      Review of Systems System review is negative for any constitutional, cardiac, pulmonary, GI or neuro symptoms or complaints other than as  described in the HPI.     Objective:   Physical Exam Filed Vitals:   10/03/12 0925  BP: 132/80  Pulse: 75  Temp: 98 F (36.7 C)   Weight: 261 lb (118.389 kg)   Gen'l - overweight AA woman who looks younger than her stated age Neck - easily palpable thyroid gland larger on the left, w/o tenderness Cor- RRR Pulm - normal respirations, w/o wheezing or rales Neuro - A&O x 3       Assessment & Plan:

## 2012-10-04 NOTE — Assessment & Plan Note (Signed)
Reviewed study results and biopsy. Patient with benign goiter.  Plan Observation. For increasing goiter, especially if any swallow problems, will consider exogenous thyroid hormone for suppression.

## 2012-10-04 NOTE — Assessment & Plan Note (Signed)
Jennifer [name] continues to address weight management with diet control and exercise.   Plan - Diet management: smart food choices, PORTION SIZE CONTROL, regular exercise. Goal - to loose 1-2 lbs.month. Target weight - 200 lbs

## 2012-10-04 NOTE — Assessment & Plan Note (Signed)
BP Readings from Last 3 Encounters:  10/03/12 132/80  09/30/12 128/70  09/02/12 125/81   Good control on her present regimen.

## 2012-11-06 ENCOUNTER — Other Ambulatory Visit: Payer: Self-pay | Admitting: Internal Medicine

## 2012-11-07 NOTE — Telephone Encounter (Signed)
Temazepam called to pharmacy  

## 2012-11-19 ENCOUNTER — Encounter: Payer: Self-pay | Admitting: Pulmonary Disease

## 2012-11-19 ENCOUNTER — Ambulatory Visit (INDEPENDENT_AMBULATORY_CARE_PROVIDER_SITE_OTHER): Payer: Federal, State, Local not specified - PPO | Admitting: Pulmonary Disease

## 2012-11-19 VITALS — BP 122/80 | HR 77 | Temp 98.2°F | Ht 68.5 in | Wt 258.0 lb

## 2012-11-19 DIAGNOSIS — G4710 Hypersomnia, unspecified: Secondary | ICD-10-CM

## 2012-11-19 DIAGNOSIS — G4730 Sleep apnea, unspecified: Secondary | ICD-10-CM

## 2012-11-19 NOTE — Progress Notes (Signed)
  Subjective:    Patient ID: Jennifer [name], female    DOB: 03-02-1951, 62 y.o.   MRN: [id number]  HPI   62/F, never smoker with positional obstructive sleep apnea.  PSG in 10/04 when she weighed 210 lbs showed a TST of 250 mins incl 37 mins of REM, RDI was 15/h with lowest desaturation of 84% & moderate snoring, predominantly REM related events.  PSG april'11 (256 lbs )>>Mild-to-moderate obstructive sleep apnea - RDI was 17/h, non supine AHI was only 1.7/h  June , 2011- portable study (using positional device) shows AHI of 10/h with desaturation index 12/h .  psg (250 lbs) aug'11 - cpap titrated to 9 cm, smal full face mask  download 8/23- 01/11/10 >> no residual events, good compliance, minimal leak  Mild intermittent cough attributed to GERD - has Nisen's x 3 - [facility]/ [place]  Has some dysphagia   Download 12/13 - cpap 9 very effective, no residuals, good usage  Few ds not used  Leak ok    11/19/2012 Underwent Knee sx in jan Had shingles 3/14 On steroids for knee & bronchitis -gained 15 lbs Pt reports she wears her CPAP everynight. Pt denies any problems w/ machine currently.  Download on cpap 9cm >> good usage, AHI 3/h, no leak   Review of Systems neg for any significant sore throat, dysphagia, itching, sneezing, nasal congestion or excess/ purulent secretions, fever, chills, sweats, unintended wt loss, pleuritic or exertional cp, hempoptysis, orthopnea pnd or change in chronic leg swelling. Also denies presyncope, palpitations, heartburn, abdominal pain, nausea, vomiting, diarrhea or change in bowel or urinary habits, dysuria,hematuria, rash, arthralgias, visual complaints, headache, numbness weakness or ataxia.     Objective:   Physical Exam  Gen. Pleasant, obese, in no distress ENT - no lesions, no post nasal drip Neck: No JVD, no thyromegaly, no carotid bruits Lungs: no use of accessory muscles, no dullness to percussion, decreased without rales or rhonchi   Cardiovascular: Rhythm regular, heart sounds  normal, no murmurs or gallops, no peripheral edema Musculoskeletal: No deformities, no cyanosis or clubbing , no tremors       Assessment & Plan:

## 2012-11-19 NOTE — Patient Instructions (Signed)
4 wk Download on new CPAP machine

## 2012-11-20 ENCOUNTER — Telehealth: Payer: Self-pay | Admitting: Pulmonary Disease

## 2012-11-20 NOTE — Assessment & Plan Note (Signed)
Ct CPAP 9 cm - has obtained new CPAP & will chk download on new machine to confirm settings ok  Weight loss encouraged, compliance with goal of at least 4-6 hrs every night is the expectation. Advised against medications with sedative side effects Cautioned against driving when sleepy - understanding that sleepiness will vary on a day to day basis

## 2012-11-20 NOTE — Telephone Encounter (Signed)
Download on cpap 9cm >> good usage,no residuals, no leak Pr ok - no changes

## 2012-11-21 NOTE — Telephone Encounter (Signed)
lmtcb x1 

## 2012-11-21 NOTE — Telephone Encounter (Signed)
I spoke with patient about results and she verbalized understanding and had no questions 

## 2012-12-03 ENCOUNTER — Other Ambulatory Visit: Payer: Self-pay | Admitting: Internal Medicine

## 2012-12-26 ENCOUNTER — Ambulatory Visit (INDEPENDENT_AMBULATORY_CARE_PROVIDER_SITE_OTHER): Payer: Federal, State, Local not specified - PPO

## 2012-12-26 DIAGNOSIS — Z23 Encounter for immunization: Secondary | ICD-10-CM

## 2013-03-10 ENCOUNTER — Other Ambulatory Visit: Payer: Self-pay | Admitting: Internal Medicine

## 2013-05-01 ENCOUNTER — Encounter: Payer: Self-pay | Admitting: Internal Medicine

## 2013-05-01 ENCOUNTER — Other Ambulatory Visit (INDEPENDENT_AMBULATORY_CARE_PROVIDER_SITE_OTHER): Payer: Federal, State, Local not specified - PPO

## 2013-05-01 ENCOUNTER — Ambulatory Visit (INDEPENDENT_AMBULATORY_CARE_PROVIDER_SITE_OTHER): Payer: Federal, State, Local not specified - PPO | Admitting: Internal Medicine

## 2013-05-01 VITALS — BP 138/82 | HR 96 | Temp 98.2°F | Wt 258.4 lb

## 2013-05-01 DIAGNOSIS — E049 Nontoxic goiter, unspecified: Secondary | ICD-10-CM

## 2013-05-01 DIAGNOSIS — R739 Hyperglycemia, unspecified: Secondary | ICD-10-CM

## 2013-05-01 DIAGNOSIS — I10 Essential (primary) hypertension: Secondary | ICD-10-CM

## 2013-05-01 DIAGNOSIS — R7309 Other abnormal glucose: Secondary | ICD-10-CM

## 2013-05-01 DIAGNOSIS — J209 Acute bronchitis, unspecified: Secondary | ICD-10-CM

## 2013-05-01 LAB — BASIC METABOLIC PANEL
BUN: 13 mg/dL (ref 6–23)
CALCIUM: 8.8 mg/dL (ref 8.4–10.5)
CHLORIDE: 105 meq/L (ref 96–112)
CO2: 33 mEq/L — ABNORMAL HIGH (ref 19–32)
CREATININE: 1 mg/dL (ref 0.4–1.2)
GFR: 75.24 mL/min (ref >60.00)
Glucose, Bld: 106 mg/dL — ABNORMAL HIGH (ref 70–99)
Potassium: 3.9 mEq/L (ref 3.5–5.1)
Sodium: 143 mEq/L (ref 135–145)

## 2013-05-01 LAB — HEMOGLOBIN A1C: HEMOGLOBIN A1C: 6.7 % — AB (ref 4.6–6.5)

## 2013-05-01 MED ORDER — HYDROCODONE-HOMATROPINE 5-1.5 MG/5ML PO SYRP: 5.0000 mL | ORAL_SOLUTION | Freq: Three times a day (TID) | ORAL | Status: DC | PRN

## 2013-05-01 MED ORDER — AZITHROMYCIN 250 MG PO TABS: ORAL_TABLET | ORAL | Status: DC

## 2013-05-01 NOTE — Patient Instructions (Signed)
Upper respiratory infection with cough/bronchitis - lungs are clear, throat without exudate, Ears are normal  Plan Z-pak as directed          Robitussin DM every 4 hours during the day          Sudafed 30 mg twice a day if you have any sinus pressure          Tylenol 500 mg take 1 or 2 every 6 hours for fever and aches          Hydrate, vitamin C, rest.    Acute Bronchitis Bronchitis is inflammation of the airways that extend from the windpipe into the lungs (bronchi). The inflammation often causes mucus to develop. This leads to a cough, which is the most common symptom of bronchitis.  In acute bronchitis, the condition usually develops suddenly and goes away over time, usually in a couple weeks. Smoking, allergies, and asthma can make bronchitis worse. Repeated episodes of bronchitis may cause further lung problems.  CAUSES Acute bronchitis is most often caused by the same virus that causes a cold. The virus can spread from person to person (contagious).  SIGNS AND SYMPTOMS   Cough.   Fever.   Coughing up mucus.   Body aches.   Chest congestion.   Chills.   Shortness of breath.   Sore throat.  DIAGNOSIS  Acute bronchitis is usually diagnosed through a physical exam. Tests, such as chest X-rays, are sometimes done to rule out other conditions.  TREATMENT  Acute bronchitis usually goes away in a couple weeks. Often times, no medical treatment is necessary. Medicines are sometimes given for relief of fever or cough. Antibiotics are usually not needed but may be prescribed in certain situations. In some cases, an inhaler may be recommended to help reduce shortness of breath and control the cough. A cool mist vaporizer may also be used to help thin bronchial secretions and make it easier to clear the chest.  HOME CARE INSTRUCTIONS  Get plenty of rest.   Drink enough fluids to keep your urine clear or pale yellow (unless you have a medical condition that requires fluid  restriction). Increasing fluids may help thin your secretions and will prevent dehydration.   Only take over-the-counter or prescription medicines as directed by your health care provider.   Avoid smoking and secondhand smoke. Exposure to cigarette smoke or irritating chemicals will make bronchitis worse. If you are a smoker, consider using nicotine gum or skin patches to help control withdrawal symptoms. Quitting smoking will help your lungs heal faster.   Reduce the chances of another bout of acute bronchitis by washing your hands frequently, avoiding people with cold symptoms, and trying not to touch your hands to your mouth, nose, or eyes.   Follow up with your health care provider as directed.  SEEK MEDICAL CARE IF: Your symptoms do not improve after 1 week of treatment.  SEEK IMMEDIATE MEDICAL CARE IF:  You develop an increased fever or chills.   You have chest pain.   You have severe shortness of breath.  You have bloody sputum.   You develop dehydration.  You develop fainting.  You develop repeated vomiting.  You develop a severe headache. MAKE SURE YOU:   Understand these instructions.  Will watch your condition.  Will get help right away if you are not doing well or get worse. Document Released: 05/18/2004 Document Revised: 12/11/2012 Document Reviewed: 10/01/2012 [facility] Patient Information 2014 [place].

## 2013-05-01 NOTE — Progress Notes (Signed)
Pre visit review using our clinic review tool, if applicable. No additional management support is needed unless otherwise documented below in the visit note. 

## 2013-05-01 NOTE — Progress Notes (Signed)
Subjective:    Patient ID: Jennifer [name], female    DOB: 23-Jun-1950, 63 y.o.   MRN: [id number]  HPI Jennifer [name] presents for a cold: she reports that she may have had a fever but has been taking aspirin q 4 hrs. She has had chest pain and shortness of breath. She has had some mucus production. No N/V. No other otc medications  Past Medical History  Diagnosis Date  . Esophageal stenosis     Stricture after fundoplication REDO  . Tinnitus     Subjective  . Mitral valve prolapse   . GERD (gastroesophageal reflux disease)   . Costochondritis   . OSA (obstructive sleep apnea)     Mild - Sleep study 08/20/02  . Pulmonary embolus 04/2008  . Chronic chest pain   . Dysphasia     Chronic  . Shortness of breath     with exertion  . Hypertension   . Heart murmur   . IBS (irritable bowel syndrome)   . Anemia   . Colon polyps     inflamed partially ulcerated hyperplastic polyp  . Diverticulosis   . Hemorrhoids   . Zoster    Past Surgical History  Procedure Laterality Date  . Brain surgery  08/19/1993    Stem surgery, Arnold Chiari Malformation  . Breast reduction surgery    . Appendectomy    . Abdominal hysterectomy    . Nissen fundoplication      X 3 lab, open x 2 last with mesh  . Cholecystectomy  2006/08/20  . Hernia repair      Hital Hernia  . Knee arthroscopy  05/16/2012    Procedure: ARTHROSCOPY KNEE;  Surgeon: Sharmon Revere, MD;  Location: [facility];  Service: Orthopedics;  Laterality: Left;  . Colonoscopy    . Esophagogastroduodenoscopy     Family History  Problem Relation Age of Onset  . Diabetes Mother   . Rheum arthritis Mother   . Hypertension Mother   . Hypertension Father   . Rheum arthritis Sister   . Hypertension Sister   . Colon cancer Maternal Grandmother   . Rheum arthritis Sister   . Rheum arthritis Sister   . Rheum arthritis Sister    History   Social History  . Marital Status: Married    Spouse Name: N/A    Number of Children: N/A  . Years of Education: N/A    Occupational History  . [employer]    Social History Main Topics  . Smoking status: Never Smoker   . Smokeless tobacco: Never Used  . Alcohol Use: No  . Drug Use: No  . Sexual Activity: Yes    Birth Control/ Protection: None   Other Topics Concern  . Not on file   Social History Narrative   [employer] Graduate - Technical school   Married August 19, 2068 - 2 years divorced; married 1984/08/19 - 2.5 years divorced; married 08-19-1988 - 1 year divorced; married '00   1 son - 2070-08-20   Sister - Died @ 83 [place]   Regular Exercise -  NO    Current Outpatient Prescriptions on File Prior to Visit  Medication Sig Dispense Refill  . ALPRAZolam (XANAX) 0.25 MG tablet TAKE ONE TABLET BY MOUTH EVERY 6 HOURS AS NEEDED  30 tablet  0  . aspirin 325 MG tablet Take 325 mg by mouth daily.        . diclofenac sodium (VOLTAREN) 1 % GEL Apply 4 g topically as needed.      Marland Kitchen  etodolac (LODINE XL) 400 MG 24 hr tablet daily as needed. Once tab twice daily      . HYDROcodone-homatropine (HYCODAN) 5-1.5 MG/5ML syrup Take 5 mLs by mouth every 6 (six) hours as needed for cough.  120 mL  1  . lidocaine (LIDODERM) 5 % Place 1 patch onto the skin as needed. Remove & Discard patch within 12 hours or as directed by MD      . ondansetron (ZOFRAN ODT) 4 MG disintegrating tablet 4mg  ODT q4 hours prn nausea/vomit  4 tablet  0  . RABEprazole (ACIPHEX) 20 MG tablet TAKE ONE TABLET BY MOUTH TWICE DAILY  60 tablet  6  . temazepam (RESTORIL) 30 MG capsule TAKE ONE CAPSULE BY MOUTH AT BEDTIME  30 capsule  5  . triamterene-hydrochlorothiazide (MAXZIDE-25) 37.5-25 MG per tablet TAKE ONE-HALF TABLET BY MOUTH EVERY DAY  30 tablet  5   No current facility-administered medications on file prior to visit.      Review of Systems System review is negative for any constitutional, cardiac, pulmonary, GI or neuro symptoms or complaints other than as described in the HPI.     Objective:   Physical Exam Filed Vitals:   05/01/13 0823  BP: 138/82  Pulse: 96   Temp: 98.2 F (36.8 C)   Wt Readings from Last 3 Encounters:  05/01/13 258 lb 6.4 oz (117.209 kg)  11/19/12 258 lb (117.028 kg)  10/03/12 261 lb (118.389 kg)   BP Readings from Last 3 Encounters:  05/01/13 138/82  11/19/12 122/80  10/03/12 132/80   Gen'l - overweight woman in no acute distress HEENT- TMs normal, Throat clear Cor - RRR Pulm - CTAP Neuro - A&O x 3 \       Assessment & Plan:  Upper respiratory infection with cough - lungs are clear, throat without exudate, Ears are normal  Plan Z-pak as directed          Robitussin DM every 4 hours during the day         Tylenol 500 mg take 1 or 2 every 6 hours for fever and aches          Hydrate, vitamin C, rest.

## 2013-05-15 ENCOUNTER — Other Ambulatory Visit: Payer: Self-pay | Admitting: Internal Medicine

## 2013-05-28 ENCOUNTER — Ambulatory Visit: Payer: Federal, State, Local not specified - PPO | Admitting: Internal Medicine

## 2013-05-28 ENCOUNTER — Encounter: Payer: Self-pay | Admitting: Internal Medicine

## 2013-05-28 ENCOUNTER — Ambulatory Visit (INDEPENDENT_AMBULATORY_CARE_PROVIDER_SITE_OTHER): Payer: Federal, State, Local not specified - PPO | Admitting: Internal Medicine

## 2013-05-28 VITALS — BP 140/94 | HR 106 | Temp 98.9°F | Wt 261.8 lb

## 2013-05-28 DIAGNOSIS — J069 Acute upper respiratory infection, unspecified: Secondary | ICD-10-CM

## 2013-05-28 DIAGNOSIS — B9789 Other viral agents as the cause of diseases classified elsewhere: Principal | ICD-10-CM

## 2013-05-28 MED ORDER — TRIAMTERENE-HCTZ 37.5-25 MG PO TABS: ORAL_TABLET | ORAL | Status: DC

## 2013-05-28 NOTE — Patient Instructions (Signed)
URI/cough - no bronchial wheezing. Persistent cough. No evidence to suggest a bacterial infection - thus no need for repeat antibiotics.  Plan Tessalon perle 100 mg 3 times a day x 10 days  Promethazine/codeine cough syrup 1 tsp every 6 hours  Prednisone 10 mg once a day for cough and inflammation of the trachea.  Hydrate  APAP for fever or aches  Loratadine (generic claritin) 10 mg once a day for the runny nose.   Call for fever, productive sputum - colored,thick, bitter tasting.

## 2013-05-28 NOTE — Progress Notes (Signed)
Pre visit review using our clinic review tool, if applicable. No additional management support is needed unless otherwise documented below in the visit note. 

## 2013-05-28 NOTE — Progress Notes (Signed)
Subjective:    Patient ID: Jennifer [name], female    DOB: [birth year], 63 y.o.   MRN: [id number]  HPI Lst seen Jan 8th for URI and was treated with a Z-pak. She did feel better. About two weeks ago her husband had a cold and shared with her. She c/o chest congestion, deep cough, rhinorrhea  and headache. She has been taking APAP q4 hrs - no documented fever. No N/V/D. She does complain of wheezing. She doesn't feel really sick.   Past Medical History  Diagnosis Date  . Esophageal stenosis     Stricture after fundoplication REDO  . Tinnitus     Subjective  . Mitral valve prolapse   . GERD (gastroesophageal reflux disease)   . Costochondritis   . OSA (obstructive sleep apnea)     Mild - Sleep study 07-08-02  . Pulmonary embolus 04/2008  . Chronic chest pain   . Dysphasia     Chronic  . Shortness of breath     with exertion  . Hypertension   . Heart murmur   . IBS (irritable bowel syndrome)   . Anemia   . Colon polyps     inflamed partially ulcerated hyperplastic polyp  . Diverticulosis   . Hemorrhoids   . Zoster    Past Surgical History  Procedure Laterality Date  . Brain surgery  1993/07/08    Stem surgery, Arnold Chiari Malformation  . Breast reduction surgery    . Appendectomy    . Abdominal hysterectomy    . Nissen fundoplication      X 3 lab, open x 2 last with mesh  . Cholecystectomy  2006-07-08  . Hernia repair      Hital Hernia  . Knee arthroscopy  05/16/2012    Procedure: ARTHROSCOPY KNEE;  Surgeon: Sharmon Revere, MD;  Location: [facility];  Service: Orthopedics;  Laterality: Left;  . Colonoscopy    . Esophagogastroduodenoscopy     Family History  Problem Relation Age of Onset  . Diabetes Mother   . Rheum arthritis Mother   . Hypertension Mother   . Hypertension Father   . Rheum arthritis Sister   . Hypertension Sister   . Colon cancer Maternal Grandmother   . Rheum arthritis Sister   . Rheum arthritis Sister   . Rheum arthritis Sister    History   Social History    . Marital Status: Married    Spouse Name: N/A    Number of Children: N/A  . Years of Education: N/A   Occupational History  . [employer]    Social History Main Topics  . Smoking status: Never Smoker   . Smokeless tobacco: Never Used  . Alcohol Use: No  . Drug Use: No  . Sexual Activity: Yes    Birth Control/ Protection: None   Other Topics Concern  . Not on file   Social History Narrative   [employer] Graduate - Technical school   Married 07-08-2068 - 2 years divorced; married 07/08/1984 - 2.5 years divorced; married 1988/07/08 - 1 year divorced; married '00   1 son - 07-08-2070   Sister - Died @ 61 [place]   Regular Exercise -  NO    Current Outpatient Prescriptions on File Prior to Visit  Medication Sig Dispense Refill  . ALPRAZolam (XANAX) 0.25 MG tablet TAKE ONE TABLET BY MOUTH EVERY 6 HOURS AS NEEDED  30 tablet  0  . aspirin 325 MG tablet Take 325 mg by mouth daily.        Marland Kitchen  diclofenac sodium (VOLTAREN) 1 % GEL Apply 4 g topically as needed.      . etodolac (LODINE XL) 400 MG 24 hr tablet daily as needed. Once tab twice daily      . lidocaine (LIDODERM) 5 % Place 1 patch onto the skin as needed. Remove & Discard patch within 12 hours or as directed by MD      . ondansetron (ZOFRAN ODT) 4 MG disintegrating tablet 4m ODT q4 hours prn nausea/vomit  4 tablet  0  . RABEprazole (ACIPHEX) 20 MG tablet TAKE ONE TABLET BY MOUTH TWICE DAILY  60 tablet  6  . temazepam (RESTORIL) 30 MG capsule TAKE ONE CAPSULE BY MOUTH AT BEDTIME AS NEEDED  30 capsule  0   No current facility-administered medications on file prior to visit.      Review of Systems Constitutional:  Negative for fever, chills, activity change and unexpected weight change.  HEENT:  Negative for hearing loss, ear pain, congestion, neck stiffness and postnasal drip. Negative for sore throat or swallowing problems. Negative for dental complaints.   Eyes: Negative for vision loss or change in visual acuity.  Respiratory: Negative for chest tightness  and wheezing. Negative for DOE.   Cardiovascular: Negative for chest pain or palpitations. No decreased exercise tolerance Gastrointestinal: No change in bowel habit. No bloating or gas. No reflux or indigestion Genitourinary: Negative for urgency, frequency, flank pain and difficulty urinating.  Musculoskeletal: Negative for myalgias, back pain, arthralgias and gait problem.  Neurological: Negative for dizziness, tremors, weakness and headaches.  Hematological: Negative for adenopathy.  Psychiatric/Behavioral: Negative for behavioral problems and dysphoric mood.       Objective:   Physical Exam Filed Vitals:   05/28/13 1645  BP: 140/94  Pulse: 106  Temp: 98.9 F (37.2 C)   Wt Readings from Last 3 Encounters:  05/28/13 261 lb 12.8 oz (118.752 kg)  05/01/13 258 lb 6.4 oz (117.209 kg)  11/19/12 258 lb (117.028 kg)   Gen'l - Robust woman in no distress HEENT- TMs normal, no tenderness to percussion over the sinus, Neck - supple Cor - RRR Pulm - normal respirations, Lungs - clear on auscultation but there are upper airway wheezes (against a closed glottis with forced expiration). Neuro - awake and alert.        Assessment & Plan:  URI/cough - no bronchial wheezing. Persistent cough. No evidence to suggest a bacterial infection - thus no need for repeat antibiotics.  Plan Tessalon perle 100 mg 3 times a day x 10 days  Promethazine/codeine cough syrup 1 tsp every 6 hours  Prednisone 10 mg once a day for cough and inflammation of the trachea.  Hydrate  APAP for fever or aches  Loratadine (generic claritin) 10 mg once a day for the runny nose.   Call for fever, productive sputum - colored,thick, bitter tasting.

## 2013-06-01 MED ORDER — HYDROCODONE-HOMATROPINE 5-1.5 MG/5ML PO SYRP: 5.0000 mL | ORAL_SOLUTION | Freq: Three times a day (TID) | ORAL | Status: DC

## 2013-06-01 MED ORDER — PREDNISONE 10 MG PO TABS: 10.0000 mg | ORAL_TABLET | Freq: Every day | ORAL | Status: DC

## 2013-06-01 MED ORDER — BENZONATATE 100 MG PO CAPS: 100.0000 mg | ORAL_CAPSULE | Freq: Two times a day (BID) | ORAL | Status: DC | PRN

## 2013-06-16 ENCOUNTER — Other Ambulatory Visit: Payer: Self-pay | Admitting: Internal Medicine

## 2013-07-15 ENCOUNTER — Other Ambulatory Visit: Payer: Self-pay | Admitting: Internal Medicine

## 2013-08-04 ENCOUNTER — Ambulatory Visit
Admission: RE | Admit: 2013-08-04 | Discharge: 2013-08-04 | Disposition: A | Discharge status: Home / Self Care | Payer: Federal, State, Local not specified - PPO | Source: Ambulatory Visit | Attending: Orthopedic Surgery | Admitting: Orthopedic Surgery

## 2013-08-04 ENCOUNTER — Other Ambulatory Visit: Payer: Self-pay | Admitting: Orthopedic Surgery

## 2013-08-04 DIAGNOSIS — M25561 Pain in right knee: Secondary | ICD-10-CM

## 2013-09-11 ENCOUNTER — Ambulatory Visit: Payer: Federal, State, Local not specified - PPO | Admitting: Pulmonary Disease

## 2013-09-19 ENCOUNTER — Ambulatory Visit
Admission: RE | Admit: 2013-09-19 | Discharge: 2013-09-19 | Disposition: A | Discharge status: Home / Self Care | Payer: Federal, State, Local not specified - PPO | Source: Ambulatory Visit | Attending: Internal Medicine | Admitting: Internal Medicine

## 2013-09-19 DIAGNOSIS — E049 Nontoxic goiter, unspecified: Secondary | ICD-10-CM

## 2013-09-23 ENCOUNTER — Telehealth: Payer: Self-pay

## 2013-09-23 NOTE — Telephone Encounter (Signed)
Left message for call back Non identifiable Pap--07/2012 MMG--09/2011--Neg Bi Rads CCS--2004 (no polyp) has had endoscopy 2010 Tdap--2014 Flu vaccine--12/2012 Shingles vaccine--2014

## 2013-09-24 ENCOUNTER — Ambulatory Visit (INDEPENDENT_AMBULATORY_CARE_PROVIDER_SITE_OTHER): Payer: Federal, State, Local not specified - PPO | Admitting: Internal Medicine

## 2013-09-24 ENCOUNTER — Encounter: Payer: Self-pay | Admitting: Internal Medicine

## 2013-09-24 VITALS — BP 128/81 | HR 76 | Temp 97.9°F | Ht 67.3 in | Wt 263.0 lb

## 2013-09-24 DIAGNOSIS — G8929 Other chronic pain: Secondary | ICD-10-CM

## 2013-09-24 DIAGNOSIS — E119 Type 2 diabetes mellitus without complications: Secondary | ICD-10-CM | POA: Insufficient documentation

## 2013-09-24 DIAGNOSIS — I10 Essential (primary) hypertension: Secondary | ICD-10-CM

## 2013-09-24 DIAGNOSIS — K219 Gastro-esophageal reflux disease without esophagitis: Secondary | ICD-10-CM

## 2013-09-24 DIAGNOSIS — R1011 Right upper quadrant pain: Secondary | ICD-10-CM

## 2013-09-24 HISTORY — DX: Type 2 diabetes mellitus without complications: E11.9

## 2013-09-24 NOTE — Progress Notes (Signed)
Subjective:    Patient ID: Jennifer [name], female    DOB: 02/15/1951, 63 y.o.   MRN: [id number]  DOS:  09/24/2013 Type of  Visit: New patient, transferring from Dr. Veverly Fells. History: In general feels well, chart is reviewed to get familiar with her medical issues. History of chronic right-sided pain, GI note reviewed. Still an issue on and off History of diabetes, patient was somehow reluctant to accept and discuss the  diagnosis. Hypertension, good medication compliance, reports normal ambulatory BPs DJD, knee pain: Currently well controlled with when necessary medications.    ROS Denies chest pain, some difficulty breathing which is a chronic issue No nausea, vomiting, diarrhea or blood in the stools. GERD symptoms are well-controlled, occasionally has dysphagia which is not a new problem. No dysuria, gross hematuria  Past Medical History  Diagnosis Date  . Esophageal stenosis     Stricture after fundoplication REDO  . Tinnitus     Subjective  . Mitral valve prolapse   . GERD (gastroesophageal reflux disease)   . Costochondritis   . OSA (obstructive sleep apnea)     Mild - Sleep study August 16, 2002  . Pulmonary embolus 04/2008  . Chronic chest pain   . Dysphasia     Chronic  . Shortness of breath     with exertion  . Hypertension   . Heart murmur   . IBS (irritable bowel syndrome)   . Anemia   . Colon polyps     inflamed partially ulcerated hyperplastic polyp  . Diverticulosis   . Hemorrhoids   . Zoster 15-Aug-2012    R flank  . Diabetes 09/24/2013    Past Surgical History  Procedure Laterality Date  . Brain surgery  August 15, 1993    Stem surgery, Arnold Chiari Malformation  . Breast reduction surgery    . Appendectomy    . Abdominal hysterectomy      still has 1 ovary   . Nissen fundoplication      X 3 lab, open x 2 last with mesh  . Cholecystectomy  August 16, 2006  . Hernia repair      Hital Hernia  . Knee arthroscopy  05/16/2012    Procedure: ARTHROSCOPY KNEE;  Surgeon: Sharmon Revere,  MD;  Location: [facility];  Service: Orthopedics;  Laterality: Left;  . Colonoscopy    . Esophagogastroduodenoscopy      History   Social History  . Marital Status: Married    Spouse Name: N/A    Number of Children: 1  . Years of Education: N/A   Occupational History  . [employer]   . TRAINING [employer]    Social History Main Topics  . Smoking status: Never Smoker   . Smokeless tobacco: Never Used  . Alcohol Use: No  . Drug Use: No  . Sexual Activity: Yes    Birth Control/ Protection: None   Other Topics Concern  . Not on file   Social History Narrative   [employer] Graduate - Technical school   Married 2068-08-15 - 2 years divorced; married 08-15-84 - 2.5 years divorced; married 15-Aug-1988 - 1 year divorced; married '00   1 son - 08-16-70   Sister - Died @ 29 [place]            Medication List       This list is accurate as of: 09/24/13 11:59 PM.  Always use your most recent med list.               ALPRAZolam  0.25 MG tablet  Commonly known as:  XANAX  TAKE ONE TABLET BY MOUTH EVERY 6 HOURS AS NEEDED     aspirin 325 MG tablet  Take 325 mg by mouth daily.     benzonatate 100 MG capsule  Commonly known as:  TESSALON  Take 1 capsule (100 mg total) by mouth 2 (two) times daily as needed for cough.     diclofenac sodium 1 % Gel  Commonly known as:  VOLTAREN  Apply 4 g topically as needed.     etodolac 400 MG 24 hr tablet  Commonly known as:  LODINE XL  daily as needed. Once tab twice daily     HYDROcodone-homatropine 5-1.5 MG/5ML syrup  Commonly known as:  HYCODAN  Take 5 mLs by mouth every 8 (eight) hours.     lidocaine 5 %  Commonly known as:  LIDODERM  Place 1 patch onto the skin as needed. Remove & Discard patch within 12 hours or as directed by MD     ondansetron 4 MG disintegrating tablet  Commonly known as:  ZOFRAN ODT  4mg  ODT q4 hours prn nausea/vomit     RABEprazole 20 MG tablet  Commonly known as:  ACIPHEX  TAKE ONE TABLET BY MOUTH TWICE DAILY     temazepam 30 MG capsule    Commonly known as:  RESTORIL  TAKE ONE CAPSULE BY MOUTH AT BEDTIME AS NEEDED     triamterene-hydrochlorothiazide 37.5-25 MG per tablet  Commonly known as:  MAXZIDE-25  TAKE ONE- HALF TABLET BY MOUTH EVER Y DAY           Objective:   Physical Exam BP 128/81  Pulse 76  Temp(Src) 97.9 F (36.6 C)  Ht 5' 7.3" (1.709 m)  Wt 263 lb (119.296 kg)  BMI 40.85 kg/m2  SpO2 98%  General -- alert, well-developed, NAD.   Lungs -- normal respiratory effort, no intercostal retractions, no accessory muscle use, and normal breath sounds.  Heart-- normal rate, regular rhythm, no murmur.  Abdomen-- Not distended, good bowel sounds,soft, slightly tender at the upper abdomen, right side.no mass-rebound Back-- no  TTP   Extremities-- no pretibial edema bilaterally  Neurologic--  alert & oriented X3. Speech normal, gait appropriate for age, strength symmetric and appropriate for age.  Psych-- Cognition and judgment appear intact. Cooperative with normal attention span and concentration. No anxious or depressed appearing.       Assessment & Plan:   Today , I spent more than 25   min with the patient: >50% of the time counseling regards  Diabetes: diagnosis, goals, diet, exercise Also reviewing the chart and labs ordered by other providers

## 2013-09-24 NOTE — Progress Notes (Signed)
Pre visit review using our clinic review tool, if applicable. No additional management support is needed unless otherwise documented below in the visit note. 

## 2013-09-24 NOTE — Patient Instructions (Signed)
Get your blood work before you leave   Exercise daily!   Two great books to learn about diabetes Diabetes fro Dummies "The [facility] Diabetes diet" book  If you need more information about a healthy diet,  Diabetes visit  the American Heart Association, it  is a great resource online at:  http://www.richard-flynn.net/   Next visit is for routine check up regards your blood sugar    in 3 months, fasting Please make an appointment

## 2013-09-24 NOTE — Telephone Encounter (Signed)
Unable to reach prior to visit  

## 2013-09-25 ENCOUNTER — Telehealth: Payer: Self-pay | Admitting: Internal Medicine

## 2013-09-25 LAB — BASIC METABOLIC PANEL
BUN: 15 mg/dL (ref 6–23)
CALCIUM: 9.2 mg/dL (ref 8.4–10.5)
CO2: 26 mEq/L (ref 19–32)
Chloride: 103 mEq/L (ref 96–112)
Creatinine, Ser: 1.2 mg/dL (ref 0.4–1.2)
GFR: 59.93 mL/min — ABNORMAL LOW (ref >60.00)
Glucose, Bld: 81 mg/dL (ref 70–99)
Potassium: 3.6 mEq/L (ref 3.5–5.1)
SODIUM: 141 meq/L (ref 135–145)

## 2013-09-25 LAB — HEMOGLOBIN A1C: HEMOGLOBIN A1C: 6.9 % — AB (ref 4.6–6.5)

## 2013-09-25 NOTE — Telephone Encounter (Signed)
Relevant patient education assigned to patient using Emmi. ° °

## 2013-09-25 NOTE — Assessment & Plan Note (Signed)
Chronic right-sided pain, GI notes reviewed, this is a on and off problem

## 2013-09-25 NOTE — Assessment & Plan Note (Signed)
diabetes, All previous A1c reviewed. Patient educated about what the a1c is, goals, diet, exercise.  Encouraged to learn more about the dx (see instructions) Labs

## 2013-09-25 NOTE — Assessment & Plan Note (Signed)
GERD, symptoms well controlled. Dysphagia stable, still has symptoms sometimes

## 2013-09-25 NOTE — Assessment & Plan Note (Signed)
Hypertension, good medication compliance; labs reviewed, due for a BMP

## 2013-09-28 ENCOUNTER — Other Ambulatory Visit: Payer: Self-pay | Admitting: Internal Medicine

## 2013-10-02 ENCOUNTER — Telehealth: Payer: Self-pay | Admitting: Internal Medicine

## 2013-10-02 DIAGNOSIS — E119 Type 2 diabetes mellitus without complications: Secondary | ICD-10-CM

## 2013-10-02 NOTE — Telephone Encounter (Signed)
Caller name: Braedyn  Call back number:[phone]   Reason for call:  Pt states that Blood Sugar is off (states around 69) ; and wants to have labs to have blood sugar tested or have something prescribed to she can monitor it from home.

## 2013-10-02 NOTE — Telephone Encounter (Signed)
Yes please

## 2013-10-02 NOTE — Telephone Encounter (Signed)
Spoke with patient who is requesting a glucometer so she can check her blood sugar at home. Patient also stated that she has never used one before and needs to be shown how to use it. Appt made for Friday 10/03/13 on nurse schedule at 3:45.   Patient is also requesting a referral to a nutritionist for more extensive diabetic teaching. Okay to place referral?

## 2013-10-03 ENCOUNTER — Ambulatory Visit (INDEPENDENT_AMBULATORY_CARE_PROVIDER_SITE_OTHER): Payer: Federal, State, Local not specified - PPO | Admitting: *Deleted

## 2013-10-03 DIAGNOSIS — E119 Type 2 diabetes mellitus without complications: Secondary | ICD-10-CM

## 2013-10-03 MED ORDER — ONETOUCH ULTRASOFT LANCETS MISC: Status: DC

## 2013-10-03 MED ORDER — GLUCOSE BLOOD VI STRP
ORAL_STRIP | Status: DC
Start: 2013-10-03 — End: 2013-11-06

## 2013-10-03 NOTE — Telephone Encounter (Signed)
Referral to Nutrition placed.

## 2013-10-03 NOTE — Patient Instructions (Signed)
Patient educated on blood glucose monitoring, proper diet and importance of exercise in controlling blood sugar. Patient able to verbalize understanding of glucose monitoring and recording. Patient advised that referral to nutrition has been made.   Rx for test strips and lancets sent to [facility]

## 2013-10-06 ENCOUNTER — Telehealth: Payer: Self-pay | Admitting: *Deleted

## 2013-10-06 NOTE — Telephone Encounter (Signed)
Spoke with patient who called concerned that her blood sugar was elevated (229) after eating a chicken salad sandwich. She stated over the past several days her sugars have been staying in the 90's to low 100's. I advised her that her previous reading have been on target and she should not be overly concenred with 1 reading. I advised her to watch the carbs and to continue to get regular exercise. She has appt on Wed with nutritionist.

## 2013-10-08 ENCOUNTER — Encounter: Payer: Federal, State, Local not specified - PPO | Attending: Internal Medicine | Admitting: Nutrition

## 2013-10-16 ENCOUNTER — Encounter: Payer: Self-pay | Admitting: Pulmonary Disease

## 2013-10-16 ENCOUNTER — Ambulatory Visit (INDEPENDENT_AMBULATORY_CARE_PROVIDER_SITE_OTHER): Payer: Federal, State, Local not specified - PPO | Admitting: Pulmonary Disease

## 2013-10-16 VITALS — BP 122/72 | HR 74 | Ht 68.0 in | Wt 256.0 lb

## 2013-10-16 DIAGNOSIS — Z86711 Personal history of pulmonary embolism: Secondary | ICD-10-CM

## 2013-10-16 DIAGNOSIS — G4710 Hypersomnia, unspecified: Secondary | ICD-10-CM

## 2013-10-16 DIAGNOSIS — G4730 Sleep apnea, unspecified: Principal | ICD-10-CM

## 2013-10-16 NOTE — Patient Instructions (Signed)
CPAP supplies will be renewed  

## 2013-10-16 NOTE — Assessment & Plan Note (Signed)
Mild fatigue may be related to her other issues including hyperglycemia. I again emphasized importance of sleep quantity and sleep hygiene.  Continue current pressure of 9 cm Supplies will be reviewed She appears to be compliant by report

## 2013-10-16 NOTE — Assessment & Plan Note (Signed)
Continue aspirin 325 with food

## 2013-10-16 NOTE — Progress Notes (Signed)
   Subjective:    Patient ID: Jennifer [name], female    DOB: 1951/03/11, 63 y.o.   MRN: [id number]  HPI  62/F, never smoker with positional obstructive sleep apnea.  PSG in 10/04 when she weighed 210 lbs showed a TST of 250 mins incl 37 mins of REM, RDI was 15/h with lowest desaturation of 84% & moderate snoring, predominantly REM related events.  PSG april'11 (256 lbs )>>Mild-to-moderate obstructive sleep apnea - RDI was 17/h, non supine AHI was only 1.7/h  June , 2011- portable study (using positional device) shows AHI of 10/h with desaturation index 12/h .  psg (250 lbs) aug'11 - cpap titrated to 9 cm, small full face mask  download 8/23- 01/11/10 >> no residual events, good compliance, minimal leak  Mild intermittent cough attributed to GERD - has Nisen's x 3 - [place]/ [place]  Has some dysphagia   Download 12/13 - cpap 9 very effective, no residuals, good usage  Few ds not used  Leak ok  11/19/2012  Underwent Knee sx in jan  Had shingles 3/14  On steroids for knee & bronchitis -gained 15 lbs  Pt reports she wears her CPAP everynight. Pt denies any problems w/ machine currently.  Download on cpap 9cm >> good usage, AHI 3/h, no leak   Chief Complaint  Patient presents with  . Follow-up    pt wearing cpap 7 hours nightly.  Pt has no complaints with supplies, needs new mask.     She does not feel as rested anymore  Had high sugars -HbA1c slightly high -she is monitoring her glucose .working out and sincerely trying to lose weight  Thyroid ultrasound study was reviewed with her  She takes aspirin 325 daily, after she completed anticoagulation for an episode of PE.     Review of Systems neg for any significant sore throat, dysphagia, itching, sneezing, nasal congestion or excess/ purulent secretions, fever, chills, sweats, unintended wt loss, pleuritic or exertional cp, hempoptysis, orthopnea pnd or change in chronic leg swelling. Also denies presyncope, palpitations,  heartburn, abdominal pain, nausea, vomiting, diarrhea or change in bowel or urinary habits, dysuria,hematuria, rash, arthralgias, visual complaints, headache, numbness weakness or ataxia.     Objective:   Physical Exam  Gen. Pleasant, obese, in no distress ENT - no lesions, no post nasal drip Neck: No JVD, no thyromegaly, no carotid bruits Lungs: no use of accessory muscles, no dullness to percussion, decreased without rales or rhonchi  Cardiovascular: Rhythm regular, heart sounds  normal, no murmurs or gallops, no peripheral edema Musculoskeletal: No deformities, no cyanosis or clubbing , no tremors       Assessment & Plan:

## 2013-10-21 ENCOUNTER — Telehealth: Payer: Self-pay | Admitting: *Deleted

## 2013-10-21 DIAGNOSIS — E785 Hyperlipidemia, unspecified: Secondary | ICD-10-CM

## 2013-10-21 NOTE — Telephone Encounter (Signed)
Left message on machine for patient to call the office and schedule a lab appointment for a lipid panel.

## 2013-10-22 NOTE — Telephone Encounter (Signed)
Caller name: Dalaya  Call back number: [phone]   Reason for call:  See phone note below.  Can we get this order placed?  Pt will calll back on 7/6 to schedule the lab.

## 2013-10-22 NOTE — Telephone Encounter (Signed)
Future orders placed for lipid profile

## 2013-10-23 ENCOUNTER — Telehealth: Payer: Self-pay | Admitting: *Deleted

## 2013-10-23 NOTE — Telephone Encounter (Signed)
rx refill- xanax 0.25mg  Last OV- 09/24/13 Last refilled- 03/10/13 #30 / 0 rf  UDS- none

## 2013-10-27 MED ORDER — ALPRAZOLAM 0.25 MG PO TABS: ORAL_TABLET | ORAL | Status: DC

## 2013-10-27 NOTE — Addendum Note (Signed)
Addended by: Kathlene November E on: 10/27/2013 09:31 AM   Modules accepted: Orders

## 2013-10-27 NOTE — Telephone Encounter (Signed)
Detailed message left on Pharmacy VM authorizing the Xanax 0.25 1 po q6 hr prn #30 with 1 refill. Requested a call back if needed.     KP

## 2013-10-27 NOTE — Telephone Encounter (Signed)
Prescription printed, will get a UDS on return to the office

## 2013-11-05 ENCOUNTER — Telehealth: Payer: Self-pay | Admitting: Internal Medicine

## 2013-11-05 DIAGNOSIS — E089 Diabetes mellitus due to underlying condition without complications: Secondary | ICD-10-CM

## 2013-11-05 NOTE — Telephone Encounter (Signed)
Caller name: Nyriah  Call back [phone] Pharmacy: [facility]   Reason for call:   Pt is needing the RX's resent and more descriptive that just "as prescribed".  [employer] stated this.    1)glucose blood (ONE TOUCH ULTRA TEST) test strip  2)Lancets (ONETOUCH ULTRASOFT) lancets

## 2013-11-06 MED ORDER — ONETOUCH ULTRASOFT LANCETS MISC: Status: DC

## 2013-11-06 MED ORDER — GLUCOSE BLOOD VI STRP: ORAL_STRIP | Status: DC

## 2013-11-06 NOTE — Telephone Encounter (Signed)
rx resent with instructions.  

## 2013-11-10 ENCOUNTER — Encounter: Payer: Federal, State, Local not specified - PPO | Admitting: Nutrition

## 2013-11-11 ENCOUNTER — Other Ambulatory Visit: Payer: Self-pay | Admitting: Internal Medicine

## 2013-11-11 ENCOUNTER — Encounter: Payer: Federal, State, Local not specified - PPO | Admitting: Nutrition

## 2013-11-12 ENCOUNTER — Telehealth: Payer: Self-pay | Admitting: Internal Medicine

## 2013-11-12 MED ORDER — ACIPHEX 20 MG PO TBEC: DELAYED_RELEASE_TABLET | ORAL | Status: DC

## 2013-11-12 NOTE — Telephone Encounter (Signed)
Patient informed of rx being sent in, she said thank you.  We will see her at her appointment in Sept.

## 2013-11-14 ENCOUNTER — Encounter: Payer: Self-pay | Admitting: Physician Assistant

## 2013-11-14 ENCOUNTER — Telehealth: Payer: Self-pay | Admitting: Family Medicine

## 2013-11-14 ENCOUNTER — Other Ambulatory Visit: Payer: Self-pay | Admitting: Family Medicine

## 2013-11-14 ENCOUNTER — Ambulatory Visit (INDEPENDENT_AMBULATORY_CARE_PROVIDER_SITE_OTHER): Payer: Federal, State, Local not specified - PPO | Admitting: Physician Assistant

## 2013-11-14 ENCOUNTER — Telehealth: Payer: Self-pay | Admitting: Internal Medicine

## 2013-11-14 ENCOUNTER — Ambulatory Visit (HOSPITAL_BASED_OUTPATIENT_CLINIC_OR_DEPARTMENT_OTHER)
Admission: RE | Admit: 2013-11-14 | Discharge: 2013-11-14 | Disposition: A | Discharge status: Home / Self Care | Payer: Federal, State, Local not specified - PPO | Source: Ambulatory Visit | Attending: Diagnostic Radiology | Admitting: Diagnostic Radiology

## 2013-11-14 VITALS — BP 130/78 | HR 96 | Temp 98.1°F | Ht 68.0 in | Wt 256.1 lb

## 2013-11-14 DIAGNOSIS — R109 Unspecified abdominal pain: Secondary | ICD-10-CM

## 2013-11-14 DIAGNOSIS — R1011 Right upper quadrant pain: Secondary | ICD-10-CM

## 2013-11-14 DIAGNOSIS — Z9089 Acquired absence of other organs: Secondary | ICD-10-CM | POA: Insufficient documentation

## 2013-11-14 LAB — POCT URINALYSIS DIPSTICK
BILIRUBIN UA: NEGATIVE
Blood, UA: NEGATIVE
GLUCOSE UA: NEGATIVE
KETONES UA: NEGATIVE
LEUKOCYTES UA: NEGATIVE
Nitrite, UA: NEGATIVE
Protein, UA: NEGATIVE
Spec Grav, UA: 1.01
Urobilinogen, UA: 0.2
pH, UA: 5

## 2013-11-14 LAB — CBC WITH DIFFERENTIAL/PLATELET
Basophils Absolute: 0 10*3/uL (ref 0.0–0.1)
Basophils Relative: 1 % (ref 0–1)
Eosinophils Absolute: 0.3 10*3/uL (ref 0.0–0.7)
Eosinophils Relative: 6 % — ABNORMAL HIGH (ref 0–5)
HEMATOCRIT: 37.5 % (ref 36.0–46.0)
HEMOGLOBIN: 12.8 g/dL (ref 12.0–15.0)
LYMPHS PCT: 47 % — AB (ref 12–46)
Lymphs Abs: 2.2 10*3/uL (ref 0.7–4.0)
MCH: 28.3 pg (ref 26.0–34.0)
MCHC: 34.1 g/dL (ref 30.0–36.0)
MCV: 83 fL (ref 78.0–100.0)
MONO ABS: 0.6 10*3/uL (ref 0.1–1.0)
Monocytes Relative: 12 % (ref 3–12)
NEUTROS ABS: 1.6 10*3/uL — AB (ref 1.7–7.7)
Neutrophils Relative %: 34 % — ABNORMAL LOW (ref 43–77)
Platelets: 267 10*3/uL (ref 150–400)
RBC: 4.52 MIL/uL (ref 3.87–5.11)
RDW: 15.7 % — ABNORMAL HIGH (ref 11.5–15.5)
WBC: 4.7 10*3/uL (ref 4.0–10.5)

## 2013-11-14 MED ORDER — HYOSCYAMINE SULFATE 0.125 MG SL SUBL: 0.1250 mg | SUBLINGUAL_TABLET | SUBLINGUAL | Status: DC | PRN

## 2013-11-14 MED ORDER — ETODOLAC 500 MG PO TABS: 500.0000 mg | ORAL_TABLET | Freq: Two times a day (BID) | ORAL | Status: DC

## 2013-11-14 NOTE — Progress Notes (Signed)
Patient presents to clinic today c/o 4 days of RUQ pain radiating to her side and back.  Denies fever, chills, nausea or vomiting.  Denies change to bowel or bladder habits.  Patient is s/p cholecystectomy.  Denies hx of liver or pancreatic dysfunction.  Denies IV drug use, recent sexual intercourse or blood exposure.  Patient with significant PMH of abdominal surgery but is passing stool and having flatulence without difficulty.  Denies melena, tenesmus or hematochezia.  Denies recent travel or sick contact.  Denies hx of nephrolithiasis.  States she has had episodes like this previously without discoverable cause.  Past Medical History  Diagnosis Date  . Esophageal stenosis     Stricture after fundoplication REDO  . Tinnitus     Subjective  . Mitral valve prolapse   . GERD (gastroesophageal reflux disease)   . Costochondritis   . OSA (obstructive sleep apnea)     Mild - Sleep study 2004  . Pulmonary embolus 04/2008  . Chronic chest pain   . Dysphasia     Chronic  . Shortness of breath     with exertion  . Hypertension   . Heart murmur   . IBS (irritable bowel syndrome)   . Anemia   . Colon polyps     inflamed partially ulcerated hyperplastic polyp  . Diverticulosis   . Hemorrhoids   . Zoster 2014    R flank  . Diabetes 09/24/2013    Current Outpatient Prescriptions on File Prior to Visit  Medication Sig Dispense Refill  . ACIPHEX 20 MG tablet TAKE ONE TABLET BY MOUTH TWICE DAILY  60 tablet  1  . ALPRAZolam (XANAX) 0.25 MG tablet TAKE ONE TABLET BY MOUTH EVERY 6 HOURS AS NEEDED  30 tablet  1  . benzonatate (TESSALON) 100 MG capsule Take 1 capsule (100 mg total) by mouth 2 (two) times daily as needed for cough.  20 capsule  0  . glucose blood (ONE TOUCH ULTRA TEST) test strip Check once daily  100 each  12  . Lancets (ONETOUCH ULTRASOFT) lancets Check once daily  100 each  12  . ondansetron (ZOFRAN ODT) 4 MG disintegrating tablet 50m ODT q4 hours prn nausea/vomit  4 tablet  0  .  temazepam (RESTORIL) 30 MG capsule TAKE ONE CAPSULE BY MOUTH AT BEDTIME AS NEEDED  30 capsule  5  . triamterene-hydrochlorothiazide (MAXZIDE-25) 37.5-25 MG per tablet TAKE ONE- HALF TABLET BY MOUTH EVER Y DAY  30 tablet  5   No current facility-administered medications on file prior to visit.    Allergies  Allergen Reactions  . Morphine Anaphylaxis  . Promethazine Hcl     Hallucinations  . Sulfonamide Derivatives Hives    Family History  Problem Relation Age of Onset  . Diabetes Mother   . Rheum arthritis Mother   . Hypertension Mother   . Hypertension Father   . Rheum arthritis Sister   . Hypertension Sister   . Colon cancer Maternal Grandmother   . Rheum arthritis Sister   . Rheum arthritis Sister   . Rheum arthritis Sister     History   Social History  . Marital Status: Married    Spouse Name: N/A    Number of Children: 1  . Years of Education: N/A   Occupational History  . [employer]   . [employer]   Social History Main Topics  . Smoking status: Never Smoker   . Smokeless tobacco: Never Used  . Alcohol Use: No  .  Drug Use: No  . Sexual Activity: Yes    Birth Control/ Protection: None   Other Topics Concern  . 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  All other ROS are negative.  BP 130/78  Pulse 96  Temp(Src) 98.1 F (36.7 C) (Oral)  Ht '5\' 8"'  (1.727 m)  Wt 256 lb 1.9 oz (116.175 kg)  BMI 38.95 kg/m2  SpO2 98%  Physical Exam  Vitals reviewed. Constitutional: She is oriented to person, place, and time and well-developed, well-nourished, and in no distress.  HENT:  Head: Normocephalic and atraumatic.  Eyes: Conjunctivae are normal.  Neck: Neck supple.  Cardiovascular: Normal rate, regular rhythm, normal heart sounds and intact  distal pulses.   Pulmonary/Chest: Effort normal and breath sounds normal. No respiratory distress. She has no wheezes. She has no rales. She exhibits no tenderness.  Abdominal: Soft. Bowel sounds are normal. There is no hepatosplenomegaly. There is tenderness in the right upper quadrant. There is no rigidity, no CVA tenderness and negative Murphy's sign. No hernia.  Neurological: She is alert and oriented to person, place, and time.  Skin: Skin is warm and dry. No rash noted.  Psychiatric: Affect normal.    Recent Results (from the past 2158/07/01 hour(s))  BASIC METABOLIC PANEL     Status: Abnormal   Collection Time    09/24/13  2:35 PM      Result Value Ref Range   Sodium 141  135 - 145 mEq/L   Potassium 3.6  3.5 - 5.1 mEq/L   Chloride 103  96 - 112 mEq/L   CO2 26  19 - 32 mEq/L   Glucose, Bld 81  70 - 99 mg/dL   BUN 15  6 - 23 mg/dL   Creatinine, Ser 1.2  0.4 - 1.2 mg/dL   Calcium 9.2  8.4 - 10.5 mg/dL   GFR 59.93 (*) >60.00 mL/min  HEMOGLOBIN A1C     Status: Abnormal   Collection Time    09/24/13  2:35 PM      Result Value Ref Range   Hemoglobin A1C 6.9 (*) 4.6 - 6.5 %   Comment: Glycemic Control Guidelines for People with Diabetes:Non Diabetic:  <6%Goal of Therapy: <7%Additional Action Suggested:  >8%   POCT URINALYSIS DIPSTICK     Status: None   Collection Time    11/14/13  3:16 PM      Result Value Ref Range   Color, UA yellow     Clarity, UA clear     Glucose, UA negative     Bilirubin, UA negative     Ketones, UA negative     Spec Grav, UA 1.010     Blood, UA negative     pH, UA 5.0     Protein, UA negative     Urobilinogen, UA 0.2     Nitrite, UA negative     Leukocytes, UA Negative    CBC WITH DIFFERENTIAL     Status: Abnormal   Collection Time    11/14/13  3:42 PM      Result Value Ref Range   WBC 4.7  4.0 - 10.5 K/uL   RBC 4.52  3.87 -  5.11 MIL/uL   Hemoglobin 12.8  12.0 - 15.0 g/dL   HCT 37.5  36.0 - 46.0 %   MCV 83.0  78.0 - 100.0 fL   MCH 28.3  26.0 -  34.0 pg   MCHC 34.1  30.0 - 36.0 g/dL   RDW 15.7 (*) 11.5 - 15.5 %   Platelets 267  150 - 400 K/uL   Neutrophils Relative % 34 (*) 43 - 77 %   Neutro Abs 1.6 (*) 1.7 - 7.7 K/uL   Lymphocytes Relative 47 (*) 12 - 46 %   Lymphs Abs 2.2  0.7 - 4.0 K/uL   Monocytes Relative 12  3 - 12 %   Monocytes Absolute 0.6  0.1 - 1.0 K/uL   Eosinophils Relative 6 (*) 0 - 5 %   Eosinophils Absolute 0.3  0.0 - 0.7 K/uL   Basophils Relative 1  0 - 1 %   Basophils Absolute 0.0  0.0 - 0.1 K/uL   Smear Review Criteria for review not met    COMPREHENSIVE METABOLIC PANEL     Status: None   Collection Time    11/14/13  3:42 PM      Result Value Ref Range   Sodium 141  135 - 145 mEq/L   Potassium 4.2  3.5 - 5.3 mEq/L   Chloride 104  96 - 112 mEq/L   CO2 29  19 - 32 mEq/L   Glucose, Bld 88  70 - 99 mg/dL   BUN 16  6 - 23 mg/dL   Creat 1.01  0.50 - 1.10 mg/dL   Total Bilirubin 0.3  0.2 - 1.2 mg/dL   Alkaline Phosphatase 53  39 - 117 U/L   AST 17  0 - 37 U/L   ALT 14  0 - 35 U/L   Total Protein 6.7  6.0 - 8.3 g/dL   Albumin 3.9  3.5 - 5.2 g/dL   Calcium 9.1  8.4 - 10.5 mg/dL  LIPASE     Status: None   Collection Time    11/14/13  3:42 PM      Result Value Ref Range   Lipase 23  0 - 75 U/L    Assessment/Plan: RUQ pain UA unremarkable.  Patient s/p cholecystectomy. Negative CVA tenderness.  Will obtain labs to include CBC, CMP, Lipase, urine culture.  Will obtain STAT CT scan.  Increase fluids. Stick with liquid diet. Patient allergic to morphine and other narcotic pain medications.  Rx for etodolac given. If symptoms acutely worsen before workup is complete, patient to proceed to the ER.

## 2013-11-14 NOTE — Telephone Encounter (Signed)
Caller: Takeya/Patient; Patient Name: Jennifer [name]; PCP: Kathlene November; [place] Phone Number: [phone] Pain on right side/flank, back and abdomen for the last 3-4 days.  Pain has been constant and keeps getting nauseated.  Has had cholesystectomy 3 years ago, shingles 2 years ago but this pain is different.  Pain feels deeper, currently 6/10.  Afebrile.  Triaged in Flank Pain guideline - See provider within 24 hours due to persistant flank pain not previously evaluated.  Unable to make appointment in office since many appointment times blocked out.   Patient is just a few minutes from office, would like to be worked in, can reach patient at  work  [phone].

## 2013-11-14 NOTE — Patient Instructions (Signed)
Please obtain labs.  I will call you with your results.  Stop by the front desk for instruction of your CT scan.  Take Etodolac for pain.  Keep well hydrated.  I would stick with a liquid diet for now.  If pain acutely worsens over the weekend, please proceed to the ER.

## 2013-11-14 NOTE — Telephone Encounter (Signed)
Spoke with patient who reported the same as below.  Appointment scheduled with Elyn Aquas at [facility] at 3 pm.

## 2013-11-14 NOTE — Telephone Encounter (Signed)
Patient called stating that she was having severe pain in her side. Call was transferred to CAN

## 2013-11-14 NOTE — Progress Notes (Signed)
Pre visit review using our clinic review tool, if applicable. No additional management support is needed unless otherwise documented below in the visit note. 

## 2013-11-14 NOTE — Telephone Encounter (Signed)
Spoke with radiology and patient> her CT scan was negative for any acute concerns. No gallstones, masses, lymphadenopathy etc. Shows evidence of previously seen Pulmonary nodule, cholecystectomy and fundoplication. All look stable. Patient advised bland diet and clear fluids over weekend, seek care if worsens and Hyoscyamine sent in to try for pain. Call if no improvement.

## 2013-11-15 LAB — COMPREHENSIVE METABOLIC PANEL
ALT: 14 U/L (ref 0–35)
AST: 17 U/L (ref 0–37)
Albumin: 3.9 g/dL (ref 3.5–5.2)
Alkaline Phosphatase: 53 U/L (ref 39–117)
BUN: 16 mg/dL (ref 6–23)
CO2: 29 meq/L (ref 19–32)
Calcium: 9.1 mg/dL (ref 8.4–10.5)
Chloride: 104 mEq/L (ref 96–112)
Creat: 1.01 mg/dL (ref 0.50–1.10)
Glucose, Bld: 88 mg/dL (ref 70–99)
Potassium: 4.2 mEq/L (ref 3.5–5.3)
SODIUM: 141 meq/L (ref 135–145)
TOTAL PROTEIN: 6.7 g/dL (ref 6.0–8.3)
Total Bilirubin: 0.3 mg/dL (ref 0.2–1.2)

## 2013-11-15 LAB — LIPASE: Lipase: 23 U/L (ref 0–75)

## 2013-11-16 NOTE — Assessment & Plan Note (Signed)
UA unremarkable.  Patient s/p cholecystectomy. Negative CVA tenderness.  Will obtain labs to include CBC, CMP, Lipase, urine culture.  Will obtain STAT CT scan.  Increase fluids. Stick with liquid diet. Patient allergic to morphine and other narcotic pain medications.  Rx for etodolac given. If symptoms acutely worsen before workup is complete, patient to proceed to the ER.

## 2013-11-17 ENCOUNTER — Other Ambulatory Visit: Payer: Self-pay | Admitting: *Deleted

## 2013-11-17 ENCOUNTER — Telehealth: Payer: Self-pay | Admitting: Physician Assistant

## 2013-11-17 DIAGNOSIS — B029 Zoster without complications: Secondary | ICD-10-CM

## 2013-11-17 MED ORDER — CYCLOSPORINE 0.05 % OP EMUL: 1.0000 [drp] | Freq: Two times a day (BID) | OPHTHALMIC | Status: DC

## 2013-11-17 MED ORDER — DICYCLOMINE HCL 20 MG PO TABS: 20.0000 mg | ORAL_TABLET | Freq: Three times a day (TID) | ORAL | Status: DC

## 2013-11-17 NOTE — Telephone Encounter (Signed)
Message copied by Raiford Noble on Mon Nov 17, 2013  9:30 PM ------      Message from: Rockwell Germany      Created: Mon Nov 17, 2013  7:11 PM      Regarding: Results & medication problem       Patient informed, understood & states that she did not have the Hyoscymine filled as the pharmacist informed her that there would be an interaction with her Restasis [for Dry Eye Syndrome] and cause worsening of the condition; explained to the patient that this medication is not on her medication list and that I would be glad to add it on and send the message to provider for an alternative medication; pt agreed, informed to call for follow-up appointment if no improvement with new medication/SLS            [Please send to [place] in office tomorrow as I told patient we would contact her about this tomorrow even though we would be out of office]      Thanks.      Ivin Booty            ----- Message -----         From: Leeanne Rio, PA-C         Sent: 11/16/2013   9:41 PM           To: Rockwell Germany, CMA            Labs and imaging both unremarkable for cause of her symptoms.  How are her symptoms after the addition of the hyoscyamine ?       ------

## 2013-11-17 NOTE — Progress Notes (Signed)
PER PATIENT REQUEST AT 07.27.15 PHONE CALL RE: RESULTS/SLS

## 2013-11-17 NOTE — Telephone Encounter (Signed)
So the hyoscyamine does not interact with the Restasis.  The hyoscyamine carries the potential side effects of "dry eyes".  This does not mean that she would have a problem with the combination.  We can attempt a trial of Bentyl for her spasms to see if it helps.  It should not carry the risk of dry eyes.  Rx sent to pharmacy.  Can be taken three times daily before meals.  See if this helps.  If no relief, consider allowing [place] to set her up with GI.

## 2013-11-18 NOTE — Telephone Encounter (Signed)
Left message for pt to return my call.

## 2013-11-21 MED ORDER — VALACYCLOVIR HCL 1 G PO TABS: 1000.0000 mg | ORAL_TABLET | Freq: Three times a day (TID) | ORAL | Status: DC

## 2013-11-21 NOTE — Telephone Encounter (Signed)
Pt left message at 9:25am returning my call and requested return call at work # [phone]. Attempted to reach pt and left message to return my call.

## 2013-11-21 NOTE — Telephone Encounter (Signed)
Sent in Rx for Valtrex for patient to have on hand.  If rash occurs, patient to fill Rx and begin taking as directed.  I am willing to do this as patient does have noted history of shingles of her R flank last year.

## 2013-11-21 NOTE — Telephone Encounter (Signed)
Left detailed message on pt's cell# and to call if any questions. 

## 2013-11-21 NOTE — Telephone Encounter (Signed)
Notified pt and she states she has appt with Dr Carlean Purl (GI) in September. Pt states she is concerned that her shingles is coming back. States she continues to have side and back pain, does not see rash. Notes that she did water aerobics yesterday and notes that the belt she wears in the water was very hot on the side of her pain and this has never happened before. She will pick up Bentyl today and let us know if this does not help. Please advise if there are any further instructions?

## 2013-12-01 ENCOUNTER — Emergency Department (HOSPITAL_COMMUNITY)
Admission: EM | Admit: 2013-12-01 | Discharge: 2013-12-02 | Disposition: A | Discharge status: Home / Self Care | Payer: Federal, State, Local not specified - PPO | Attending: Emergency Medicine | Admitting: Emergency Medicine

## 2013-12-01 ENCOUNTER — Encounter (HOSPITAL_COMMUNITY): Payer: Self-pay | Admitting: Emergency Medicine

## 2013-12-01 ENCOUNTER — Emergency Department (HOSPITAL_COMMUNITY): Payer: Federal, State, Local not specified - PPO

## 2013-12-01 ENCOUNTER — Telehealth: Payer: Self-pay | Admitting: Internal Medicine

## 2013-12-01 DIAGNOSIS — Z860100 Personal history of colon polyps, unspecified: Secondary | ICD-10-CM | POA: Insufficient documentation

## 2013-12-01 DIAGNOSIS — Z8619 Personal history of other infectious and parasitic diseases: Secondary | ICD-10-CM | POA: Diagnosis not present

## 2013-12-01 DIAGNOSIS — Z9089 Acquired absence of other organs: Secondary | ICD-10-CM | POA: Insufficient documentation

## 2013-12-01 DIAGNOSIS — I10 Essential (primary) hypertension: Secondary | ICD-10-CM | POA: Insufficient documentation

## 2013-12-01 DIAGNOSIS — Z86711 Personal history of pulmonary embolism: Secondary | ICD-10-CM | POA: Insufficient documentation

## 2013-12-01 DIAGNOSIS — Z79899 Other long term (current) drug therapy: Secondary | ICD-10-CM | POA: Insufficient documentation

## 2013-12-01 DIAGNOSIS — R011 Cardiac murmur, unspecified: Secondary | ICD-10-CM | POA: Diagnosis not present

## 2013-12-01 DIAGNOSIS — Z8669 Personal history of other diseases of the nervous system and sense organs: Secondary | ICD-10-CM | POA: Diagnosis not present

## 2013-12-01 DIAGNOSIS — Z90710 Acquired absence of both cervix and uterus: Secondary | ICD-10-CM | POA: Diagnosis not present

## 2013-12-01 DIAGNOSIS — E119 Type 2 diabetes mellitus without complications: Secondary | ICD-10-CM | POA: Diagnosis not present

## 2013-12-01 DIAGNOSIS — K219 Gastro-esophageal reflux disease without esophagitis: Secondary | ICD-10-CM | POA: Insufficient documentation

## 2013-12-01 DIAGNOSIS — G8929 Other chronic pain: Secondary | ICD-10-CM

## 2013-12-01 DIAGNOSIS — R110 Nausea: Secondary | ICD-10-CM | POA: Insufficient documentation

## 2013-12-01 DIAGNOSIS — Z9889 Other specified postprocedural states: Secondary | ICD-10-CM | POA: Insufficient documentation

## 2013-12-01 DIAGNOSIS — Z8739 Personal history of other diseases of the musculoskeletal system and connective tissue: Secondary | ICD-10-CM | POA: Diagnosis not present

## 2013-12-01 DIAGNOSIS — R1011 Right upper quadrant pain: Secondary | ICD-10-CM

## 2013-12-01 DIAGNOSIS — R109 Unspecified abdominal pain: Secondary | ICD-10-CM

## 2013-12-01 DIAGNOSIS — Z862 Personal history of diseases of the blood and blood-forming organs and certain disorders involving the immune mechanism: Secondary | ICD-10-CM | POA: Insufficient documentation

## 2013-12-01 LAB — COMPREHENSIVE METABOLIC PANEL
ALBUMIN: 4 g/dL (ref 3.5–5.2)
ALT: 16 U/L (ref 0–35)
ANION GAP: 14 (ref 5–15)
AST: 16 U/L (ref 0–37)
Alkaline Phosphatase: 71 U/L (ref 39–117)
BUN: 16 mg/dL (ref 6–23)
CO2: 25 mEq/L (ref 19–32)
CREATININE: 0.84 mg/dL (ref 0.50–1.10)
Calcium: 9.6 mg/dL (ref 8.4–10.5)
Chloride: 101 mEq/L (ref 96–112)
GFR calc Af Amer: 86 mL/min — ABNORMAL LOW (ref >90)
GFR calc non Af Amer: 74 mL/min — ABNORMAL LOW (ref >90)
Glucose, Bld: 138 mg/dL — ABNORMAL HIGH (ref 70–99)
POTASSIUM: 3.7 meq/L (ref 3.7–5.3)
Sodium: 140 mEq/L (ref 137–147)
TOTAL PROTEIN: 8 g/dL (ref 6.0–8.3)
Total Bilirubin: 0.3 mg/dL (ref 0.3–1.2)

## 2013-12-01 LAB — CBC WITH DIFFERENTIAL/PLATELET
Basophils Absolute: 0 10*3/uL (ref 0.0–0.1)
Basophils Relative: 0 % (ref 0–1)
Eosinophils Absolute: 0 10*3/uL (ref 0.0–0.7)
Eosinophils Relative: 0 % (ref 0–5)
HCT: 39.5 % (ref 36.0–46.0)
HEMOGLOBIN: 13.4 g/dL (ref 12.0–15.0)
Lymphocytes Relative: 18 % (ref 12–46)
Lymphs Abs: 1.2 10*3/uL (ref 0.7–4.0)
MCH: 28.3 pg (ref 26.0–34.0)
MCHC: 33.9 g/dL (ref 30.0–36.0)
MCV: 83.3 fL (ref 78.0–100.0)
Monocytes Absolute: 0.2 10*3/uL (ref 0.1–1.0)
Monocytes Relative: 3 % (ref 3–12)
NEUTROS ABS: 5 10*3/uL (ref 1.7–7.7)
NEUTROS PCT: 79 % — AB (ref 43–77)
Platelets: 283 10*3/uL (ref 150–400)
RBC: 4.74 MIL/uL (ref 3.87–5.11)
RDW: 14.6 % (ref 11.5–15.5)
WBC: 6.3 10*3/uL (ref 4.0–10.5)

## 2013-12-01 LAB — URINALYSIS, ROUTINE W REFLEX MICROSCOPIC
BILIRUBIN URINE: NEGATIVE
Glucose, UA: NEGATIVE mg/dL
HGB URINE DIPSTICK: NEGATIVE
Ketones, ur: NEGATIVE mg/dL
Nitrite: NEGATIVE
PH: 6.5 (ref 5.0–8.0)
Protein, ur: NEGATIVE mg/dL
SPECIFIC GRAVITY, URINE: 1.016 (ref 1.005–1.030)
UROBILINOGEN UA: 0.2 mg/dL (ref 0.0–1.0)

## 2013-12-01 LAB — LIPASE, BLOOD: Lipase: 12 U/L (ref 11–59)

## 2013-12-01 LAB — URINE MICROSCOPIC-ADD ON

## 2013-12-01 LAB — D-DIMER, QUANTITATIVE: D-Dimer, Quant: 0.87 ug/mL-FEU — ABNORMAL HIGH (ref 0.00–0.48)

## 2013-12-01 MED ORDER — SODIUM CHLORIDE 0.9 % IV BOLUS (SEPSIS)
1000.0000 mL | Freq: Once | INTRAVENOUS | Status: AC
  Administered 2013-12-01: 1000 mL via INTRAVENOUS

## 2013-12-01 MED ORDER — HYDROMORPHONE HCL PF 1 MG/ML IJ SOLN
1.0000 mg | Freq: Once | INTRAMUSCULAR | Status: AC
  Administered 2013-12-01: 1 mg via INTRAVENOUS
  Filled 2013-12-01: qty 1

## 2013-12-01 MED ORDER — IOHEXOL 350 MG/ML SOLN
100.0000 mL | Freq: Once | INTRAVENOUS | Status: AC | PRN
  Administered 2013-12-01: 100 mL via INTRAVENOUS

## 2013-12-01 MED ORDER — ONDANSETRON HCL 4 MG/2ML IJ SOLN
4.0000 mg | Freq: Once | INTRAMUSCULAR | Status: AC
  Administered 2013-12-01: 4 mg via INTRAVENOUS
  Filled 2013-12-01: qty 2

## 2013-12-01 MED ORDER — IOHEXOL 300 MG/ML  SOLN: 100.0000 mL | Freq: Once | INTRAMUSCULAR | Status: DC | PRN

## 2013-12-01 NOTE — ED Notes (Signed)
Bed: WA21 Expected date:  Expected time:  Means of arrival:  Comments: EMS  

## 2013-12-01 NOTE — ED Notes (Signed)
RN attempted to start IV and draw blood for protocol orders, pt refusing IV at this time. Pt would like to wait until Provider sees her.

## 2013-12-01 NOTE — ED Notes (Signed)
Pt placed on 2L O2. After being given pain medication, pt O2 sats were dropping into upper 80's when pt. Fell asleep.

## 2013-12-01 NOTE — ED Provider Notes (Signed)
CSN: [id number]     Arrival date & time 12/01/13  2104 History   First MD Initiated Contact with Patient 12/01/13 2140     No chief complaint on file.    (Consider location/radiation/quality/duration/timing/severity/associated sxs/prior Treatment) Patient is a 63 y.o. female presenting with abdominal pain.  Abdominal Pain Pain location:  R flank Pain quality: sharp   Pain radiates to:  RUQ Pain severity:  Moderate Onset quality:  Gradual Duration:  2 weeks Timing:  Constant Progression:  Worsening Chronicity:  New Context: not recent illness   Relieved by:  Nothing Worsened by:  Movement and palpation Ineffective treatments:  None tried Associated symptoms: nausea   Associated symptoms: no anorexia, no chest pain, no chills, no constipation, no cough, no diarrhea, no dysuria, no fever, no hematuria, no shortness of breath, no sore throat, no vaginal bleeding, no vaginal discharge and no vomiting     Past Medical History  Diagnosis Date  . Esophageal stenosis     Stricture after fundoplication REDO  . Tinnitus     Subjective  . Mitral valve prolapse   . GERD (gastroesophageal reflux disease)   . Costochondritis   . OSA (obstructive sleep apnea)     Mild - Sleep study 2004  . Pulmonary embolus 04/2008  . Chronic chest pain   . Dysphasia     Chronic  . Shortness of breath     with exertion  . Hypertension   . Heart murmur   . IBS (irritable bowel syndrome)   . Anemia   . Colon polyps     inflamed partially ulcerated hyperplastic polyp  . Diverticulosis   . Hemorrhoids   . Zoster 2014    R flank  . Diabetes 09/24/2013   Past Surgical History  Procedure Laterality Date  . Brain surgery  1995    Stem surgery, Arnold Chiari Malformation  . Breast reduction surgery    . Appendectomy    . Abdominal hysterectomy      still has 1 ovary   . Nissen fundoplication      X 3 lab, open x 2 last with mesh  . Cholecystectomy  2008  . Hernia repair      Hital Hernia  .  Knee arthroscopy  05/16/2012    Procedure: ARTHROSCOPY KNEE;  Surgeon: Sharmon Revere, MD;  Location: [place];  Service: Orthopedics;  Laterality: Left;  . Colonoscopy    . Esophagogastroduodenoscopy     Family History  Problem Relation Age of Onset  . Diabetes Mother   . Rheum arthritis Mother   . Hypertension Mother   . Hypertension Father   . Rheum arthritis Sister   . Hypertension Sister   . Colon cancer Maternal Grandmother   . Rheum arthritis Sister   . Rheum arthritis Sister   . Rheum arthritis Sister    History  Substance Use Topics  . Smoking status: Never Smoker   . Smokeless tobacco: Never Used  . Alcohol Use: No   OB History   Grav Para Term Preterm Abortions TAB SAB Ect Mult Living   1 1 1  0 0 0 0 0 0 1     Review of Systems  Constitutional: Negative for fever and chills.  HENT: Negative for congestion, rhinorrhea and sore throat.   Eyes: Negative for photophobia and visual disturbance.  Respiratory: Negative for cough and shortness of breath.   Cardiovascular: Negative for chest pain and leg swelling.  Gastrointestinal: Positive for nausea and abdominal pain. Negative  for vomiting, diarrhea, constipation and anorexia.  Endocrine: Negative for polyphagia and polyuria.  Genitourinary: Negative for dysuria, hematuria, flank pain, vaginal bleeding, vaginal discharge and enuresis.  Musculoskeletal: Negative for back pain and gait problem.  Skin: Negative for color change and rash.  Neurological: Negative for dizziness, syncope, light-headedness and numbness.  Hematological: Negative for adenopathy. Does not bruise/bleed easily.  All other systems reviewed and are negative.     Allergies  Morphine; Morphine and related; Promethazine hcl; and Sulfonamide derivatives  Home Medications   Prior to Admission medications   Medication Sig Start Date End Date Taking? Authorizing Provider  cycloSPORINE (RESTASIS) 0.05 % ophthalmic emulsion Place 1 drop into both  eyes daily.   Yes Historical Provider, MD  dexamethasone (DECADRON) 4 MG tablet Take 2 mg by mouth as needed (for pain patient thought was related to shingles.).   Yes Historical Provider, MD  glucose blood (ONE TOUCH ULTRA TEST) test strip Check once daily 11/06/13  Yes Colon Branch, MD  Lancets [facility] ULTRASOFT) lancets Check once daily 11/06/13  Yes Colon Branch, MD  RABEprazole (ACIPHEX) 20 MG tablet Take 20 mg by mouth 2 (two) times daily.   Yes Historical Provider, MD  temazepam (RESTORIL) 30 MG capsule Take 30 mg by mouth at bedtime as needed for sleep.   Yes Historical Provider, MD  triamterene-hydrochlorothiazide (MAXZIDE-25) 37.5-25 MG per tablet Take 0.5 tablets by mouth daily.   Yes Historical Provider, MD   BP 138/77  Pulse 72  Temp(Src) 97.7 F (36.5 C) (Oral)  Resp 22  SpO2 97% Physical Exam  Vitals reviewed. Constitutional: She is oriented to person, place, and time. She appears well-developed and well-nourished.  HENT:  Head: Normocephalic and atraumatic.  Right Ear: External ear normal.  Left Ear: External ear normal.  Eyes: Conjunctivae and EOM are normal. Pupils are equal, round, and reactive to light.  Neck: Normal range of motion. Neck supple.  Cardiovascular: Normal rate, regular rhythm, normal heart sounds and intact distal pulses.   Pulmonary/Chest: Effort normal and breath sounds normal.  Abdominal: Soft. Bowel sounds are normal. There is tenderness in the right upper quadrant. There is CVA tenderness (R).  Musculoskeletal: Normal range of motion.  Neurological: She is alert and oriented to person, place, and time.  Skin: Skin is warm and dry.    ED Course  Procedures (including critical care time) Labs Review Labs Reviewed  URINALYSIS, ROUTINE W REFLEX MICROSCOPIC - Abnormal; Notable for the following:    Leukocytes, UA TRACE (*)    All other components within normal limits  COMPREHENSIVE METABOLIC PANEL - Abnormal; Notable for the following:    Glucose,  Bld 138 (*)    GFR calc non Af Amer 74 (*)    GFR calc Af Amer 86 (*)    All other components within normal limits  CBC WITH DIFFERENTIAL - Abnormal; Notable for the following:    Neutrophils Relative % 79 (*)    All other components within normal limits  D-DIMER, QUANTITATIVE - Abnormal; Notable for the following:    D-Dimer, Quant 0.87 (*)    All other components within normal limits  URINE CULTURE  GRAM STAIN  LIPASE, BLOOD  URINE MICROSCOPIC-ADD ON    Imaging Review Ct Angio Chest W/cm &/or Wo Cm  12/01/2013   CLINICAL DATA:  Shortness of breath. Right upper quadrant pain. Elevated D-dimer.  EXAM: CT ANGIOGRAPHY CHEST WITH CONTRAST  TECHNIQUE: Multidetector CT imaging of the chest was performed using the standard  protocol during bolus administration of intravenous contrast. Multiplanar CT image reconstructions and MIPs were obtained to evaluate the vascular anatomy.  CONTRAST:  112mL OMNIPAQUE IOHEXOL 350 MG/ML SOLN  COMPARISON:  Chest CT 01/24/2009.  FINDINGS: Mediastinum: There are no filling defects within the pulmonary arterial tree to suggest underlying pulmonary embolism. Heart size is mildly enlarged. Small amount of pericardial fluid and/or thickening, unlikely to be of hemodynamic significance at this time. No associated pericardial calcification. No pathologically enlarged mediastinal or hilar lymph nodes. Esophagus is unremarkable in appearance.  Lungs/Pleura: Linear opacities in the lower lobes of the lungs bilaterally may reflect areas of subsegmental atelectasis and/or scarring. 4 mm subpleural nodule in the periphery of the right upper lobe (image 23 of series 11), 3 mm right upper lobe nodule (image 28 of series 11), and a 4 mm subpleural nodule in the periphery of the right middle lobe (image 50 of series 11) are all unchanged compared to prior study 01/24/2009 and can be considered benign requiring no imaging followup. No other new suspicious appearing pulmonary nodules or  masses are noted. No acute consolidative airspace disease. No pleural effusions.  Upper Abdomen: Status post cholecystectomy. Postoperative changes near the gastroesophageal junction likely from prior fundoplication.  Musculoskeletal: There are no aggressive appearing lytic or blastic lesions noted in the visualized portions of the skeleton. Cavernous hemangioma in T7 incidentally noted.  Review of the MIP images confirms the above findings.  IMPRESSION: 1. No evidence of pulmonary embolism. 2. No acute findings in the thorax to account for the patient's symptoms. 3. Mild cardiomegaly. 4. Trace amount of pericardial fluid and/or thickening, unlikely to be of hemodynamic significance at this time, and similar to the remote prior examination. 5. Multiple tiny pulmonary nodules in the right lung are unchanged in size, number and distribution compared to the prior study can be considered benign requiring no further imaging followup. 6. Additional findings as above.   Electronically Signed   By: Vinnie Langton M.D.   On: 12/01/2013 23:57     EKG Interpretation None      MDM   Final diagnoses:  RUQ pain  Right flank pain  Chronic abdominal pain    63 y.o. female  with pertinent PMH of prior zoster of same side, prior PE, chronic abd pain presents with sharp r sided flank pain, constant, without dyspnea.  She has a ho similar pain and has had extensive workup in the past including cholecystectomy and MR, which were unremarkable.  She presents today with vitals as above.  Location and nature of the pain suggest most likely etiology of renal colic, however chronic and constant nature of pain with ho PE in RUQ with exacerbation with breathing suggest possible recurrent PE, however pt with wells 1.5 and low risk, with more likely diagnosis of chronic abd pain, so will check ddimer.   Pt is not hypoxic at this time.  She has previously received dilaudid and norco in the past without incident.   Labs and  imaging as above reviewed. DDimer positive, so will check CT PE study.  Symptoms improved after Dilaudid.  PE study unremarkable for acute etiology to explain pain.  Patient has a long-standing history of similar symptoms, and given workup today symptoms are likely acute exacerbation of chronic process which is as yet undefined.  Given recent CT abdomen pelvis, and negative UA consider nephrolithiasis unlikely etiology. Patient was given standard return precautions for abdominal pain, voiced understanding, and agreed to followup.    1.  RUQ pain   2. Right flank pain   3. Chronic abdominal pain         Debby Freiberg, MD 12/02/13 [phone]

## 2013-12-01 NOTE — ED Notes (Signed)
Pt presents from home, c/o of left flank pain 10/10, radiating to the back. Ongoing for the last 2 weeks, worsened today. Denies fevers and chills or urinary symptoms.

## 2013-12-01 NOTE — Telephone Encounter (Signed)
Primary care notes reviewed.  I have offered an appt with an APP.  She declines.  She is currently scheduled for 01/20/14.  I will place her on the cancellation list.  She is advised to also call and check for cancellations.  She will call back if she wants to see an APP.

## 2013-12-02 LAB — URINE CULTURE

## 2013-12-02 MED ORDER — HYDRALAZINE HCL 20 MG/ML IJ SOLN: 10.0000 mg | Freq: Once | INTRAMUSCULAR | Status: DC

## 2013-12-02 NOTE — Discharge Instructions (Signed)
Abdominal Pain, Women °Abdominal (stomach, pelvic, or belly) pain can be caused by many things. It is important to tell your doctor: °· The location of the pain. °· Does it come and go or is it present all the time? °· Are there things that start the pain (eating certain foods, exercise)? °· Are there other symptoms associated with the pain (fever, nausea, vomiting, diarrhea)? °All of this is helpful to know when trying to find the cause of the pain. °CAUSES  °· Stomach: virus or bacteria infection, or ulcer. °· Intestine: appendicitis (inflamed appendix), regional ileitis (Crohn's disease), ulcerative colitis (inflamed colon), irritable bowel syndrome, diverticulitis (inflamed diverticulum of the colon), or cancer of the stomach or intestine. °· Gallbladder disease or stones in the gallbladder. °· Kidney disease, kidney stones, or infection. °· Pancreas infection or cancer. °· Fibromyalgia (pain disorder). °· Diseases of the female organs: °¨ Uterus: fibroid (non-cancerous) tumors or infection. °¨ Fallopian tubes: infection or tubal pregnancy. °¨ Ovary: cysts or tumors. °¨ Pelvic adhesions (scar tissue). °¨ Endometriosis (uterus lining tissue growing in the pelvis and on the pelvic organs). °¨ Pelvic congestion syndrome (female organs filling up with blood just before the menstrual period). °¨ Pain with the menstrual period. °¨ Pain with ovulation (producing an egg). °¨ Pain with an IUD (intrauterine device, birth control) in the uterus. °¨ Cancer of the female organs. °· Functional pain (pain not caused by a disease, may improve without treatment). °· Psychological pain. °· Depression. °DIAGNOSIS  °Your doctor will decide the seriousness of your pain by doing an examination. °· Blood tests. °· X-rays. °· Ultrasound. °· CT scan (computed tomography, special type of X-ray). °· MRI (magnetic resonance imaging). °· Cultures, for infection. °· Barium enema (dye inserted in the large intestine, to better view it with  X-rays). °· Colonoscopy (looking in intestine with a lighted tube). °· Laparoscopy (minor surgery, looking in abdomen with a lighted tube). °· Major abdominal exploratory surgery (looking in abdomen with a large incision). °TREATMENT  °The treatment will depend on the cause of the pain.  °· Many cases can be observed and treated at home. °· Over-the-counter medicines recommended by your caregiver. °· Prescription medicine. °· Antibiotics, for infection. °· Birth control pills, for painful periods or for ovulation pain. °· Hormone treatment, for endometriosis. °· Nerve blocking injections. °· Physical therapy. °· Antidepressants. °· Counseling with a psychologist or psychiatrist. °· Minor or major surgery. °HOME CARE INSTRUCTIONS  °· Do not take laxatives, unless directed by your caregiver. °· Take over-the-counter pain medicine only if ordered by your caregiver. Do not take aspirin because it can cause an upset stomach or bleeding. °· Try a clear liquid diet (broth or water) as ordered by your caregiver. Slowly move to a bland diet, as tolerated, if the pain is related to the stomach or intestine. °· Have a thermometer and take your temperature several times a day, and record it. °· Bed rest and sleep, if it helps the pain. °· Avoid sexual intercourse, if it causes pain. °· Avoid stressful situations. °· Keep your follow-up appointments and tests, as your caregiver orders. °· If the pain does not go away with medicine or surgery, you may try: °¨ Acupuncture. °¨ Relaxation exercises (yoga, meditation). °¨ Group therapy. °¨ Counseling. °SEEK MEDICAL CARE IF:  °· You notice certain foods cause stomach pain. °· Your home care treatment is not helping your pain. °· You need stronger pain medicine. °· You want your IUD removed. °· You feel faint or   lightheaded. °· You develop nausea and vomiting. °· You develop a rash. °· You are having side effects or an allergy to your medicine. °SEEK IMMEDIATE MEDICAL CARE IF:  °· Your  pain does not go away or gets worse. °· You have a fever. °· Your pain is felt only in portions of the abdomen. The right side could possibly be appendicitis. The left lower portion of the abdomen could be colitis or diverticulitis. °· You are passing blood in your stools (bright red or black tarry stools, with or without vomiting). °· You have blood in your urine. °· You develop chills, with or without a fever. °· You pass out. °MAKE SURE YOU:  °· Understand these instructions. °· Will watch your condition. °· Will get help right away if you are not doing well or get worse. °Document Released: 02/05/2007 Document Revised: 08/25/2013 Document Reviewed: 02/25/2009 °ExitCare® Patient Information ©2015 [employer]. This information is not intended to replace advice given to you by your health care provider. Make sure you discuss any questions you have with your health care provider. ° °

## 2013-12-03 ENCOUNTER — Ambulatory Visit (INDEPENDENT_AMBULATORY_CARE_PROVIDER_SITE_OTHER): Payer: Federal, State, Local not specified - PPO | Admitting: Internal Medicine

## 2013-12-03 ENCOUNTER — Encounter: Payer: Self-pay | Admitting: Internal Medicine

## 2013-12-03 VITALS — BP 114/73 | HR 72 | Temp 98.0°F | Wt 255.2 lb

## 2013-12-03 DIAGNOSIS — R1011 Right upper quadrant pain: Secondary | ICD-10-CM

## 2013-12-03 DIAGNOSIS — G8929 Other chronic pain: Secondary | ICD-10-CM

## 2013-12-03 MED ORDER — GABAPENTIN 300 MG PO CAPS: 300.0000 mg | ORAL_CAPSULE | Freq: Three times a day (TID) | ORAL | Status: DC

## 2013-12-03 NOTE — Patient Instructions (Addendum)
Start neurontin 300 mg: 1 tablet at night x 2 weeks Then 1 tablet twice a day x 2 weeks Then 1 tablet three times a day  Come back in 2 months

## 2013-12-03 NOTE — Progress Notes (Signed)
Subjective:    Patient ID: Jennifer [name], female    DOB: [birth year], 63 y.o.   MRN: [id number]  DOS:  12/03/2013 Type of visit - description: Followup History: In the last 2 weeks he was seen here in the office and subsequently and the ER with right upper quadrant discomfort and difficulty breathing. Workup including a normal CBC, BMP, LFTs, lipase. CT of the abdomen show no acute changes and nothing to account for her symptoms CT of the chest showed no pulmonary emboli and again nothing to account for her symptoms. She is here for followup   ROS Since he left the ER, she is feeling about the same, most of the pain concentrated at the posterior aspect of the right trunk and some at the anterior right abdomen. Continue with some nausea. Moving her torso does not change how she feels.    Past Medical History  Diagnosis Date  . Esophageal stenosis     Stricture after fundoplication REDO  . Tinnitus     Subjective  . Mitral valve prolapse   . GERD (gastroesophageal reflux disease)   . Costochondritis   . OSA (obstructive sleep apnea)     Mild - Sleep study 2002-08-23  . Pulmonary embolus 04/2008  . Chronic chest pain   . Dysphasia     Chronic  . Shortness of breath     with exertion  . Hypertension   . Heart murmur   . IBS (irritable bowel syndrome)   . Anemia   . Colon polyps     inflamed partially ulcerated hyperplastic polyp  . Diverticulosis   . Hemorrhoids   . Zoster 08-22-2012    R flank  . Diabetes 09/24/2013    Past Surgical History  Procedure Laterality Date  . Brain surgery  08/22/93    Stem surgery, Arnold Chiari Malformation  . Breast reduction surgery    . Appendectomy    . Abdominal hysterectomy      still has 1 ovary   . Nissen fundoplication      X 3 lab, open x 2 last with mesh  . Cholecystectomy  Aug 23, 2006  . Hernia repair      Hital Hernia  . Knee arthroscopy  05/16/2012    Procedure: ARTHROSCOPY KNEE;  Surgeon: Sharmon Revere, MD;  Location: [facility];  Service:  Orthopedics;  Laterality: Left;  . Colonoscopy    . Esophagogastroduodenoscopy      History   Social History  . Marital Status: Married    Spouse Name: N/A    Number of Children: 1  . Years of Education: N/A   Occupational History  . [employer]   . TRAINING [employer]    Social History Main Topics  . Smoking status: Never Smoker   . Smokeless tobacco: Never Used  . Alcohol Use: No  . Drug Use: No  . Sexual Activity: Yes    Birth Control/ Protection: None   Other Topics Concern  . Not on file   Social History Narrative   [employer] Graduate - Technical school   Married 22-Aug-2068 - 2 years divorced; married Aug 22, 1984 - 2.5 years divorced; married 1988-08-22 - 1 year divorced; married '00   1 son - 08/23/2070   Sister - Died @ [address] [place]            Medication List       This list is accurate as of: 12/03/13 11:59 PM.  Always use your most recent med list.  cycloSPORINE 0.05 % ophthalmic emulsion  Commonly known as:  RESTASIS  Place 1 drop into both eyes daily.     gabapentin 300 MG capsule  Commonly known as:  NEURONTIN  Take 1 capsule (300 mg total) by mouth 3 (three) times daily.     glucose blood test strip  Commonly known as:  ONE TOUCH ULTRA TEST  Check once daily     onetouch ultrasoft lancets  Check once daily     RABEprazole 20 MG tablet  Commonly known as:  ACIPHEX  Take 20 mg by mouth 2 (two) times daily.     temazepam 30 MG capsule  Commonly known as:  RESTORIL  Take 30 mg by mouth at bedtime as needed for sleep.     triamterene-hydrochlorothiazide 37.5-25 MG per tablet  Commonly known as:  MAXZIDE-25  Take 0.5 tablets by mouth daily.           Objective:   Physical Exam  Skin:      BP 114/73  Pulse 72  Temp(Src) 98 F (36.7 C) (Oral)  Wt 255 lb 4 oz (115.781 kg)  SpO2 98% General -- alert, well-developed, NAD.   Extremities-- no pretibial edema bilaterally  Neurologic--  alert & oriented X3. Speech normal, gait appropriate for age, strength  symmetric and appropriate for age.  Psych-- Cognition and judgment appear intact. Cooperative with normal attention span and concentration. No anxious or depressed appearing.        Assessment & Plan:    Today , I spent more than 25   min with the patient: >50% of the time counseling regards pain  Also reviewing the chart and labs ordered by other providers

## 2013-12-03 NOTE — Progress Notes (Signed)
Pre-visit discussion using our clinic review tool. No additional management support is needed unless otherwise documented below in the visit note.  

## 2013-12-03 NOTE — Assessment & Plan Note (Addendum)
Patient continue with  pain. R back pain started After her gallbladder surgery in 2008. Right abdominal pain started before her GB  Surgery in  2008. Symptoms are about the same after she had episode of right-sided flank shingles. On chart review, she also had a RLL  PE with possibly a lung infarction in 2010. Recent workup was unremarkable. In summary, this lady has chronic right-sided back and abdominal pain, Etiology is not clear but likely multifactorial including: Adhesions from the surgery, neuropathy as she has diabetes, postherpetic neuralgia, scarring from PE, etc. At this point I recommend symptomatic treatment with Neurontin, see instructions. She also has some nausea and she already has an appointment to see GI. If she's not better, will consider further eval;  a radiculopathy is in the differential  consequently an orthopedic surgery eval may be needed

## 2013-12-26 ENCOUNTER — Encounter: Payer: Self-pay | Admitting: Internal Medicine

## 2013-12-26 ENCOUNTER — Ambulatory Visit (INDEPENDENT_AMBULATORY_CARE_PROVIDER_SITE_OTHER): Payer: Federal, State, Local not specified - PPO | Admitting: Internal Medicine

## 2013-12-26 VITALS — BP 134/76 | HR 69 | Temp 97.6°F | Wt 258.6 lb

## 2013-12-26 DIAGNOSIS — I10 Essential (primary) hypertension: Secondary | ICD-10-CM

## 2013-12-26 DIAGNOSIS — R0989 Other specified symptoms and signs involving the circulatory and respiratory systems: Secondary | ICD-10-CM

## 2013-12-26 DIAGNOSIS — M25569 Pain in unspecified knee: Secondary | ICD-10-CM

## 2013-12-26 DIAGNOSIS — R0600 Dyspnea, unspecified: Secondary | ICD-10-CM | POA: Insufficient documentation

## 2013-12-26 DIAGNOSIS — R0609 Other forms of dyspnea: Secondary | ICD-10-CM | POA: Insufficient documentation

## 2013-12-26 DIAGNOSIS — E119 Type 2 diabetes mellitus without complications: Secondary | ICD-10-CM

## 2013-12-26 LAB — CK: CK TOTAL: 102 U/L (ref 7–177)

## 2013-12-26 LAB — LIPID PANEL
CHOL/HDL RATIO: 3
CHOLESTEROL: 165 mg/dL (ref 0–200)
HDL: 60.5 mg/dL (ref >39.00)
LDL Cholesterol: 88 mg/dL (ref 0–99)
NonHDL: 104.5
Triglycerides: 81 mg/dL (ref 0.0–149.0)
VLDL: 16.2 mg/dL (ref 0.0–40.0)

## 2013-12-26 LAB — HEMOGLOBIN A1C: Hgb A1c MFr Bld: 6.4 % (ref 4.6–6.5)

## 2013-12-26 NOTE — Assessment & Plan Note (Signed)
1 year history of dyspnea on exertion. Several  years ago he was evaluated by cardiology, found to have a Mobitz 1 block. CT of the chest last month showed no PE No anemia EKG today no acute Plan:  TSH Deconditioning? Recommend gradual exercise program

## 2013-12-26 NOTE — Assessment & Plan Note (Signed)
On Maxide 25, half tablet daily. Well-controlled, recent BMP normal

## 2013-12-26 NOTE — Progress Notes (Signed)
Pre visit review using our clinic review tool, if applicable. No additional management support is needed unless otherwise documented below in the visit note. 

## 2013-12-26 NOTE — Progress Notes (Signed)
Subjective:    Patient ID: Jennifer [name], female    DOB: [birth year], 63 y.o.   MRN: [id number]  DOS:  12/26/2013 Type of visit - description : f/u Interval history: Diabetes, ambulatory blood sugars in the morning range from 97-160. Right-sided flank, abdominal pain: Tried gabapentin but because nausea, self discontinued it. Also several years history of bilateral leg pain, from the knee down, described as ache , worse when she lays down. Also one-year history of dyspnea on exertion, more noticeable in the   last few months?.  ROS Denies chest pain or difficulty breathing at rest. No lower extremity edema. Some palpitations with exertion. No pain at the upper extremities  Past Medical History  Diagnosis Date  . Esophageal stenosis     Stricture after fundoplication REDO  . Tinnitus     Subjective  . Mitral valve prolapse   . GERD (gastroesophageal reflux disease)   . Costochondritis   . OSA (obstructive sleep apnea)     Mild - Sleep study 08/16/2002  . Pulmonary embolus 04/2008  . Chronic chest pain   . Dysphasia     Chronic  . Shortness of breath     with exertion  . Hypertension   . Heart murmur   . IBS (irritable bowel syndrome)   . Anemia   . Colon polyps     inflamed partially ulcerated hyperplastic polyp  . Diverticulosis   . Hemorrhoids   . Zoster 15-Aug-2012    R flank  . Diabetes 09/24/2013    Past Surgical History  Procedure Laterality Date  . Brain surgery  1993/08/15    Stem surgery, Arnold Chiari Malformation  . Breast reduction surgery    . Appendectomy    . Abdominal hysterectomy      still has 1 ovary   . Nissen fundoplication      X 3 lab, open x 2 last with mesh  . Cholecystectomy  August 16, 2006  . Hernia repair      Hital Hernia  . Knee arthroscopy  05/16/2012    Procedure: ARTHROSCOPY KNEE;  Surgeon: Sharmon Revere, MD;  Location: [facility];  Service: Orthopedics;  Laterality: Left;  . Colonoscopy    . Esophagogastroduodenoscopy      History   Social History  .  Marital Status: Married    Spouse Name: N/A    Number of Children: 1  . Years of Education: N/A   Occupational History  . [employer]   . 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    Social History Main Topics  . Smoking status: Never Smoker   . Smokeless tobacco: Never Used  . Alcohol Use: No  . Drug Use: No  . Sexual Activity: Yes    Birth Control/ Protection: None   Other Topics Concern  . Not on file   Social History Narrative   [employer] Graduate - Technical school   Married Aug 15, 2068 - 2 years divorced; married 1984/08/15 - 2.5 years divorced; married 08/15/88 - 1 year divorced; married '00   1 son - August 16, 2070   Sister - Died @ 43 [place]            Medication List       This list is accurate as of: 12/26/13 11:59 PM.  Always use your most recent med list.               cycloSPORINE 0.05 % ophthalmic emulsion  Commonly known as:  RESTASIS  Place 1 drop into both eyes daily.  glucose blood test strip  Commonly known as:  ONE TOUCH ULTRA TEST  Check once daily     onetouch ultrasoft lancets  Check once daily     RABEprazole 20 MG tablet  Commonly known as:  ACIPHEX  Take 20 mg by mouth 2 (two) times daily.     temazepam 30 MG capsule  Commonly known as:  RESTORIL  Take 30 mg by mouth at bedtime as needed for sleep.     triamterene-hydrochlorothiazide 37.5-25 MG per tablet  Commonly known as:  MAXZIDE-25  Take 0.5 tablets by mouth daily.           Objective:   Physical Exam BP 134/76  Pulse 69  Temp(Src) 97.6 F (36.4 C) (Oral)  Wt 258 lb 9.6 oz (117.3 kg)  SpO2 98% General -- alert, well-developed, NAD.  HEENT-- Not pale.  Lungs -- normal respiratory effort, no intercostal retractions, no accessory muscle use, and normal breath sounds.  Heart-- normal rate, regular rhythm, no murmur.  Abdomen-- Not distended, good bowel sounds,soft, non-tender. Extremities-- no pretibial edema bilaterally ; calves symmetric, + femoral-pedal pulses B Neurologic--  alert & oriented X3. Speech normal, gait  appropriate for age, strength symmetric and appropriate for age.   Psych-- Cognition and judgment appear intact. Cooperative with normal attention span and concentration. No anxious or depressed appearing. \      Assessment & Plan:   Leg  pain, atypical for claudication, normal vascular exam. Will check a CK  Today , I spent more than  25  min with the patient: >50% of the time counseling regards DOE -->chart was reviewed, explained pt why i thinks she has deconditioning

## 2013-12-26 NOTE — Assessment & Plan Note (Signed)
Due for A1c and FLP

## 2013-12-26 NOTE — Patient Instructions (Signed)
Get your blood work before you leave    Please come back to the office 4 to 5 months Stop by the front desk and schedule the visit   Reason for the visit: physical exam, fasting

## 2013-12-30 LAB — TSH: TSH: 5.13 u[IU]/mL — AB (ref 0.35–4.50)

## 2014-01-07 ENCOUNTER — Ambulatory Visit (INDEPENDENT_AMBULATORY_CARE_PROVIDER_SITE_OTHER): Payer: Federal, State, Local not specified - PPO

## 2014-01-07 DIAGNOSIS — Z23 Encounter for immunization: Secondary | ICD-10-CM

## 2014-01-08 ENCOUNTER — Emergency Department (HOSPITAL_BASED_OUTPATIENT_CLINIC_OR_DEPARTMENT_OTHER): Payer: Federal, State, Local not specified - PPO

## 2014-01-08 ENCOUNTER — Ambulatory Visit (INDEPENDENT_AMBULATORY_CARE_PROVIDER_SITE_OTHER): Payer: Federal, State, Local not specified - PPO | Admitting: Medical

## 2014-01-08 ENCOUNTER — Encounter: Payer: Self-pay | Admitting: Medical

## 2014-01-08 ENCOUNTER — Telehealth: Payer: Self-pay

## 2014-01-08 ENCOUNTER — Emergency Department (HOSPITAL_BASED_OUTPATIENT_CLINIC_OR_DEPARTMENT_OTHER)
Admission: EM | Admit: 2014-01-08 | Discharge: 2014-01-08 | Disposition: A | Discharge status: Home / Self Care | Payer: Federal, State, Local not specified - PPO | Attending: Emergency Medicine | Admitting: Emergency Medicine

## 2014-01-08 ENCOUNTER — Telehealth: Payer: Self-pay | Admitting: Medical

## 2014-01-08 ENCOUNTER — Encounter (HOSPITAL_BASED_OUTPATIENT_CLINIC_OR_DEPARTMENT_OTHER): Payer: Self-pay | Admitting: Emergency Medicine

## 2014-01-08 VITALS — BP 124/83 | HR 87 | Temp 98.5°F | Ht 67.5 in | Wt 257.8 lb

## 2014-01-08 DIAGNOSIS — Z860100 Personal history of colon polyps, unspecified: Secondary | ICD-10-CM | POA: Insufficient documentation

## 2014-01-08 DIAGNOSIS — K219 Gastro-esophageal reflux disease without esophagitis: Secondary | ICD-10-CM | POA: Insufficient documentation

## 2014-01-08 DIAGNOSIS — Z8669 Personal history of other diseases of the nervous system and sense organs: Secondary | ICD-10-CM | POA: Diagnosis not present

## 2014-01-08 DIAGNOSIS — Z9889 Other specified postprocedural states: Secondary | ICD-10-CM | POA: Diagnosis not present

## 2014-01-08 DIAGNOSIS — J209 Acute bronchitis, unspecified: Secondary | ICD-10-CM | POA: Diagnosis not present

## 2014-01-08 DIAGNOSIS — Z79899 Other long term (current) drug therapy: Secondary | ICD-10-CM | POA: Diagnosis not present

## 2014-01-08 DIAGNOSIS — K589 Irritable bowel syndrome without diarrhea: Secondary | ICD-10-CM | POA: Insufficient documentation

## 2014-01-08 DIAGNOSIS — E119 Type 2 diabetes mellitus without complications: Secondary | ICD-10-CM | POA: Diagnosis not present

## 2014-01-08 DIAGNOSIS — R05 Cough: Secondary | ICD-10-CM | POA: Insufficient documentation

## 2014-01-08 DIAGNOSIS — R011 Cardiac murmur, unspecified: Secondary | ICD-10-CM | POA: Diagnosis not present

## 2014-01-08 DIAGNOSIS — R059 Cough, unspecified: Secondary | ICD-10-CM | POA: Insufficient documentation

## 2014-01-08 DIAGNOSIS — I10 Essential (primary) hypertension: Secondary | ICD-10-CM | POA: Insufficient documentation

## 2014-01-08 DIAGNOSIS — Z8739 Personal history of other diseases of the musculoskeletal system and connective tissue: Secondary | ICD-10-CM | POA: Diagnosis not present

## 2014-01-08 DIAGNOSIS — I2699 Other pulmonary embolism without acute cor pulmonale: Secondary | ICD-10-CM | POA: Insufficient documentation

## 2014-01-08 DIAGNOSIS — Z862 Personal history of diseases of the blood and blood-forming organs and certain disorders involving the immune mechanism: Secondary | ICD-10-CM | POA: Insufficient documentation

## 2014-01-08 DIAGNOSIS — R0789 Other chest pain: Secondary | ICD-10-CM

## 2014-01-08 DIAGNOSIS — G8929 Other chronic pain: Secondary | ICD-10-CM | POA: Insufficient documentation

## 2014-01-08 DIAGNOSIS — R053 Chronic cough: Secondary | ICD-10-CM | POA: Insufficient documentation

## 2014-01-08 DIAGNOSIS — Z8619 Personal history of other infectious and parasitic diseases: Secondary | ICD-10-CM | POA: Diagnosis not present

## 2014-01-08 DIAGNOSIS — J40 Bronchitis, not specified as acute or chronic: Secondary | ICD-10-CM

## 2014-01-08 LAB — COMPREHENSIVE METABOLIC PANEL
ALK PHOS: 77 U/L (ref 39–117)
ALT: 15 U/L (ref 0–35)
AST: 20 U/L (ref 0–37)
Albumin: 3.8 g/dL (ref 3.5–5.2)
Anion gap: 12 (ref 5–15)
BILIRUBIN TOTAL: 0.2 mg/dL — AB (ref 0.3–1.2)
BUN: 13 mg/dL (ref 6–23)
CO2: 30 mEq/L (ref 19–32)
Calcium: 9.6 mg/dL (ref 8.4–10.5)
Chloride: 101 mEq/L (ref 96–112)
Creatinine, Ser: 0.9 mg/dL (ref 0.50–1.10)
GFR calc Af Amer: 78 mL/min — ABNORMAL LOW (ref >90)
GFR calc non Af Amer: 68 mL/min — ABNORMAL LOW (ref >90)
Glucose, Bld: 100 mg/dL — ABNORMAL HIGH (ref 70–99)
POTASSIUM: 3.9 meq/L (ref 3.7–5.3)
SODIUM: 143 meq/L (ref 137–147)
Total Protein: 7.7 g/dL (ref 6.0–8.3)

## 2014-01-08 LAB — CBC WITH DIFFERENTIAL/PLATELET
BASOS PCT: 0 % (ref 0–1)
Basophils Absolute: 0 10*3/uL (ref 0.0–0.1)
Eosinophils Absolute: 0.2 10*3/uL (ref 0.0–0.7)
Eosinophils Relative: 3 % (ref 0–5)
HCT: 38.7 % (ref 36.0–46.0)
Hemoglobin: 12.9 g/dL (ref 12.0–15.0)
Lymphocytes Relative: 28 % (ref 12–46)
Lymphs Abs: 1.4 10*3/uL (ref 0.7–4.0)
MCH: 28.7 pg (ref 26.0–34.0)
MCHC: 33.3 g/dL (ref 30.0–36.0)
MCV: 86 fL (ref 78.0–100.0)
MONOS PCT: 14 % — AB (ref 3–12)
Monocytes Absolute: 0.7 10*3/uL (ref 0.1–1.0)
NEUTROS ABS: 2.8 10*3/uL (ref 1.7–7.7)
NEUTROS PCT: 55 % (ref 43–77)
PLATELETS: 256 10*3/uL (ref 150–400)
RBC: 4.5 MIL/uL (ref 3.87–5.11)
RDW: 14.3 % (ref 11.5–15.5)
WBC: 5.1 10*3/uL (ref 4.0–10.5)

## 2014-01-08 LAB — TROPONIN I: Troponin I: <0.3 ng/mL (ref <0.30)

## 2014-01-08 LAB — D-DIMER, QUANTITATIVE: D-Dimer, Quant: 1.44 ug/mL-FEU — ABNORMAL HIGH (ref 0.00–0.48)

## 2014-01-08 MED ORDER — IOHEXOL 350 MG/ML SOLN
100.0000 mL | Freq: Once | INTRAVENOUS | Status: AC | PRN
  Administered 2014-01-08: 100 mL via INTRAVENOUS

## 2014-01-08 MED ORDER — XARELTO VTE STARTER PACK 15 & 20 MG PO TBPK: 15.0000 mg | ORAL_TABLET | ORAL | Status: DC

## 2014-01-08 MED ORDER — AZITHROMYCIN 250 MG PO TABS
ORAL_TABLET | ORAL | Status: DC
Start: 2014-01-08 — End: 2014-01-20

## 2014-01-08 NOTE — Telephone Encounter (Signed)
Would you call pt to see how she is feeling(She had a small PE). Does she have chest pain or feeling more sob. I want to confirm how she is. Was given xarelto starter pack. Was she given prescription as well.

## 2014-01-08 NOTE — Telephone Encounter (Signed)
Called patient who states she feels no better but she just picked up medication. Hasn't started yet. Wants to know if she could go to work on tomorrow.

## 2014-01-08 NOTE — Telephone Encounter (Addendum)
Pt was seen by Mackie Pai today for chest pressure.  She was advised to go to ER downstairs.  Pt went to [facility] instead.  Edward asked that I call Imaging to send pt to the ER.  Called [facility] and ask that they direct patient to the ER.  They said that they would.

## 2014-01-08 NOTE — Assessment & Plan Note (Signed)
Sent down to the emergency department for further evaluation. See cough assessment and plan. Appears that she had a pulmonary embolism.

## 2014-01-08 NOTE — Telephone Encounter (Signed)
Her symptoms were very minimal and PE was caught very early. Cough and slight pressure may persist a while. If she has worsening or changing symptoms needs eval here, ED or urgent care.  Return to work dependent on what type of job. Lifting or walking would not recommend. If she has very sedentary job then return to work would be ok. But also would not recommend long shift type work. What type of work does she do??

## 2014-01-08 NOTE — Patient Instructions (Signed)
Patient was sent down to the emergency department today to rule out pulmonary embolism since she did not appear to have straightforward bronchitis as she was hoping.

## 2014-01-08 NOTE — Progress Notes (Signed)
   Subjective:    Patient ID: Jennifer [name], female    DOB: 15-Apr-1951, 63 y.o.   MRN: [id number]  HPI   Pt states she feels sick. She was having only mild occasional cough yesterday. When she woke up felt tight sensation in chest. Burning and pressure. Pt has not been wheezing.(coworker thinks they can here wheezing) Pt  having some shortness of breath. Pain on deep breathing. Mild costochondral junction. Pt is coughing a lot last night.   Pt has no lt arm pain, no diaphoresis. No sweating. No leg pain. No popliteal. No long car trips or air travel. No smoker.   Hx of PE. She keeps bringing up tight sensation in chest and some back pain. Pain worse with deep breathing.   Lying supine 02 sat went to 93%. On ambulating her pulse went up to 102. At rest was 82.  Her o2 when walking was sustained at 96%.  Pt has no obvious sneezing itching eyes or running nose. No sinus pressure. Just states pressure. Review of Systems     Objective:   Physical Exam   General- No obvious acute distress. Heent- No sinus pressure. No boggy turbinates, posterior pharynx exam negative. Canal clear tm normal. Neck- full range of motion, no nuccal rigidity, No jvd. No carotid bruits. Lungs- clear even unlabored but mild shallow. No wheezing heard. Lying supine o2 sat drop 96% to 93%. On walking down hall. O2 sat sustained 96%. Pulse increase to 102. Heart- RRR. Abdomen- soft, nontender, nondistended. +BS. No rebound or guarding. Lower ext- Calfs symmetric. Negative homans signs.  On palpation costochondral junction pain is not impressvie.      Assessment & Plan:

## 2014-01-08 NOTE — ED Provider Notes (Signed)
CSN: [id number]     Arrival date & time 01/08/14  1024 History   First MD Initiated Contact with Patient 01/08/14 1037     Chief Complaint  Patient presents with  . Cough     (Consider location/radiation/quality/duration/timing/severity/associated sxs/prior Treatment) HPI Comments: Patient is a 63 year old female with history of diabetes, pulmonary embolism. She presents today with complaints of "bronchitis". She states she's had a cough for the past several days which has worsened. She is now experiencing discomfort in the front of her chest when she breathes and coughs. She denies any fevers or chills. She denies any productive cough.  She was originally seen upstairs at the internal medicine clinic and was sent down here for further workup and rule out of pulmonary embolism. She recently underwent CT angio of the chest on August 9 which was negative for PE.  Patient is a 63 y.o. female presenting with cough. The history is provided by the patient.  Cough Cough characteristics:  Non-productive Severity:  Moderate Onset quality:  Gradual Duration:  4 days Timing:  Constant Progression:  Worsening Chronicity:  New Smoker: no   Relieved by:  Nothing Worsened by:  Nothing tried Ineffective treatments:  None tried Associated symptoms: chest pain     Past Medical History  Diagnosis Date  . Esophageal stenosis     Stricture after fundoplication REDO  . Tinnitus     Subjective  . Mitral valve prolapse   . GERD (gastroesophageal reflux disease)   . Costochondritis   . OSA (obstructive sleep apnea)     Mild - Sleep study 2004  . Pulmonary embolus 04/2008  . Chronic chest pain   . Dysphasia     Chronic  . Shortness of breath     with exertion  . Hypertension   . Heart murmur   . IBS (irritable bowel syndrome)   . Anemia   . Colon polyps     inflamed partially ulcerated hyperplastic polyp  . Diverticulosis   . Hemorrhoids   . Zoster 2014    R flank  . Diabetes 09/24/2013    Past Surgical History  Procedure Laterality Date  . Brain surgery  1995    Stem surgery, Arnold Chiari Malformation  . Breast reduction surgery    . Appendectomy    . Abdominal hysterectomy      still has 1 ovary   . Nissen fundoplication      X 3 lab, open x 2 last with mesh  . Cholecystectomy  2008  . Hernia repair      Hital Hernia  . Knee arthroscopy  05/16/2012    Procedure: ARTHROSCOPY KNEE;  Surgeon: Sharmon Revere, MD;  Location: [facility];  Service: Orthopedics;  Laterality: Left;  . Colonoscopy    . Esophagogastroduodenoscopy     Family History  Problem Relation Age of Onset  . Diabetes Mother   . Rheum arthritis Mother   . Hypertension Mother   . Hypertension Father   . Rheum arthritis Sister   . Hypertension Sister   . Colon cancer Maternal Grandmother   . Rheum arthritis Sister   . Rheum arthritis Sister   . Rheum arthritis Sister    History  Substance Use Topics  . Smoking status: Never Smoker   . Smokeless tobacco: Never Used  . Alcohol Use: No   OB History   Grav Para Term Preterm Abortions TAB SAB Ect Mult Living   1 1 1  0 0 0 0 0 0 1  Review of Systems  Respiratory: Positive for cough.   Cardiovascular: Positive for chest pain.  All other systems reviewed and are negative.     Allergies  Morphine; Morphine and related; Promethazine hcl; and Sulfonamide derivatives  Home Medications   Prior to Admission medications   Medication Sig Start Date End Date Taking? Authorizing Provider  cycloSPORINE (RESTASIS) 0.05 % ophthalmic emulsion Place 1 drop into both eyes daily.    Historical Provider, MD  glucose blood (ONE TOUCH ULTRA TEST) test strip Check once daily 11/06/13   Colon Branch, MD  Lancets [facility] ULTRASOFT) lancets Check once daily 11/06/13   Colon Branch, MD  RABEprazole (ACIPHEX) 20 MG tablet Take 20 mg by mouth 2 (two) times daily.    Historical Provider, MD  temazepam (RESTORIL) 30 MG capsule Take 30 mg by mouth at bedtime as needed  for sleep.    Historical Provider, MD  triamterene-hydrochlorothiazide (MAXZIDE-25) 37.5-25 MG per tablet Take 0.5 tablets by mouth daily.    Historical Provider, MD   BP 138/81  Pulse 76  Temp(Src) 98.6 F (37 C) (Oral)  Resp 18  SpO2 98% Physical Exam  Nursing note and vitals reviewed. Constitutional: She is oriented to person, place, and time. She appears well-developed and well-nourished. No distress.  HENT:  Head: Normocephalic and atraumatic.  Mouth/Throat: Oropharynx is clear and moist.  Neck: Normal range of motion. Neck supple.  Cardiovascular: Normal rate and regular rhythm.  Exam reveals no gallop and no friction rub.   No murmur heard. Pulmonary/Chest: Effort normal and breath sounds normal. No respiratory distress. She has no wheezes.  Abdominal: Soft. Bowel sounds are normal. She exhibits no distension. There is no tenderness.  Musculoskeletal: Normal range of motion. She exhibits no edema.  Neurological: She is alert and oriented to person, place, and time.  Skin: Skin is warm and dry. She is not diaphoretic.    ED Course  Procedures (including critical care time) Labs Review Labs Reviewed  CBC WITH DIFFERENTIAL  COMPREHENSIVE METABOLIC PANEL  D-DIMER, QUANTITATIVE  TROPONIN I    Imaging Review No results found.   Date: 01/08/2014  Rate: 73  Rhythm: normal sinus rhythm  QRS Axis: normal  Intervals: normal  ST/T Wave abnormalities: normal  Conduction Disutrbances:none  Narrative Interpretation:   Old EKG Reviewed: none available    MDM   Final diagnoses:  None    The patient was sent here for evaluation of possible PE. She states she has been coughing a lot lately but her symptoms feel different than her prior pulmonary embolism. Workup today reveals findings consistent with a small nonocclusive left upper lobe and right lower lobe segmental pulmonary embolus. She will be treated with Xarelto and discharged to home. She appears very  hemodynamically stable and oxygen saturations are in the upper 90's.    Veryl Speak, MD 01/08/14 [phone]

## 2014-01-08 NOTE — ED Notes (Signed)
MD at bedside. 

## 2014-01-08 NOTE — Discharge Instructions (Signed)
Xarelto and zithromax as prescribed.    Follow up with your primary doctor in the next week, and return to the ER if your symptoms significantly worsen or change.   Pulmonary Embolism A pulmonary (lung) embolism (PE) is a blood clot that has traveled to the lung and results in a blockage of blood flow in the affected lung. Most clots come from deep veins in the legs or pelvis. PE is a dangerous and potentially life-threatening condition that can be treated if identified. CAUSES Blood clots form in a vein for different reasons. Usually several things cause blood clots. They include:  The flow of blood slows down.  The inside of the vein is damaged in some way.  The person has a condition that makes the blood clot more easily. RISK FACTORS Some people are more likely than others to develop PE. Risk factors include:   Smoking.  Being overweight (obese).  Sitting or lying still for a long time. This includes long-distance travel, paralysis, or recovery from an illness or surgery. Other factors that increase risk are:   Older age, especially over 27 years of age.  Having a family history of blood clots or if you have already had a blood clot.  Having major or lengthy surgery. This is especially true for surgery on the hip, knee, or belly (abdomen). Hip surgery is particularly high risk.  Having a long, thin tube (catheter) placed inside a vein during a medical procedure.  Breaking a hip or leg.  Having cancer or cancer treatment.  Medicines containing the female hormone estrogen. This includes birth control pills and hormone replacement therapy.  Other circulation or heart problems.  Pregnancy and childbirth.  Hormone changes make the blood clot more easily during pregnancy.  The fetus puts pressure on the veins of the pelvis.  There is a risk of injury to veins during delivery or a caesarean delivery. The risk is highest just after childbirth.  PREVENTION   Exercise the  legs regularly. Take a brisk 30 minute walk every day.  Maintain a weight that is appropriate for your height.  Avoid sitting or lying in bed for long periods of time without moving your legs.  Women, particularly those over the age of 76 years, should consider the risks and benefits of taking estrogen medicines, including birth control pills.  Do not smoke, especially if you take estrogen medicines.  Long-distance travel can increase your risk. You should exercise your legs by walking or pumping the muscles every hour.  Many of the risk factors above relate to situations that exist with hospitalization, either for illness, injury, or elective surgery. Prevention may include medical and nonmedical measures.   Your health care provider will assess you for the need for venous thromboembolism prevention when you are admitted to the hospital. If you are having surgery, your surgeon will assess you the day of or day after surgery.  SYMPTOMS  The symptoms of a PE usually start suddenly and include:  Shortness of breath.  Coughing.  Coughing up blood or blood-tinged mucus.  Chest pain. Pain is often worse with deep breaths.  Rapid heartbeat. DIAGNOSIS  If a PE is suspected, your health care provider will take a medical history and perform a physical exam. Other tests that may be required include:  Blood tests, such as studies of the clotting properties of your blood.  Imaging tests, such as ultrasound, CT, MRI, and other tests to see if you have clots in your legs or lungs.  An electrocardiogram. This can look for heart strain from blood clots in the lungs. TREATMENT   The most common treatment for a PE is blood thinning (anticoagulant) medicine, which reduces the blood's tendency to clot. Anticoagulants can stop new blood clots from forming and old clots from growing. They cannot dissolve existing clots. Your body does this by itself over time. Anticoagulants can be given by mouth,  through an intravenous (IV) tube, or by injection. Your health care provider will determine the best program for you.  Less commonly, clot-dissolving medicines (thrombolytics) are used to dissolve a PE. They carry a high risk of bleeding, so they are used mainly in severe cases.  Very rarely, a blood clot in the leg needs to be removed surgically.  If you are unable to take anticoagulants, your health care provider may arrange for you to have a filter placed in a main vein in your abdomen. This filter prevents clots from traveling to your lungs. HOME CARE INSTRUCTIONS   Take all medicines as directed by your health care provider.  Learn as much as you can about DVT.  Wear a medical alert bracelet or carry a medical alert card.  Ask your health care provider how soon you can go back to normal activities. It is important to stay active to prevent blood clots. If you are on anticoagulant medicine, avoid contact sports.  It is very important to exercise. This is especially important while traveling, sitting, or standing for long periods of time. Exercise your legs by walking or by tightening and relaxing your leg muscles regularly. Take frequent walks.  You may need to wear compression stockings. These are tight elastic stockings that apply pressure to the lower legs. This pressure can help keep the blood in the legs from clotting. Taking Warfarin Warfarin is a daily medicine that is taken by mouth. Your health care provider will advise you on the length of treatment (usually 3-6 months, sometimes lifelong). If you take warfarin:  Understand how to take warfarin and foods that can affect how warfarin works in Veterinary surgeon.  Too much and too little warfarin are both dangerous. Too much warfarin increases the risk of bleeding. Too little warfarin continues to allow the risk for blood clots. Warfarin and Regular Blood Testing While taking warfarin, you will need to have regular blood tests to  measure your blood clotting time. These blood tests usually include both the prothrombin time (PT) and international normalized ratio (INR) tests. The PT and INR results allow your health care provider to adjust your dose of warfarin. It is very important that you have your PT and INR tested as often as directed by your health care provider.  Warfarin and Your Diet Avoid major changes in your diet, or notify your health care provider before changing your diet. Arrange a visit with a registered dietitian to answer your questions. Many foods, especially foods high in vitamin K, can interfere with warfarin and affect the PT and INR results. You should eat a consistent amount of foods high in vitamin K. Foods high in vitamin K include:   Spinach, kale, broccoli, cabbage, collard and turnip greens, Brussels sprouts, peas, cauliflower, seaweed, and parsley.  Beef and pork liver.  Green tea.  Soybean oil. Warfarin with Other Medicines Many medicines can interfere with warfarin and affect the PT and INR results. You must:  Tell your health care provider about any and all medicines, vitamins, and supplements you take, including aspirin and other over-the-counter anti-inflammatory medicines.  Be especially cautious with aspirin and anti-inflammatory medicines. Ask your health care provider before taking these.  Do not take or discontinue any prescribed or over-the-counter medicine except on the advice of your health care provider or pharmacist. Warfarin Side Effects Warfarin can have side effects, such as easy bruising and difficulty stopping bleeding. Ask your health care provider or pharmacist about other side effects of warfarin. You will need to:  Hold pressure over cuts for longer than usual.  Notify your dentist and other health care providers that you are taking warfarin before you undergo any procedures where bleeding may occur. Warfarin with Alcohol and Tobacco   Drinking alcohol frequently  can increase the effect of warfarin, leading to excess bleeding. It is best to avoid alcoholic drinks or consume only very small amounts while taking warfarin. Notify your health care provider if you change your alcohol intake.  Do not use any tobacco products including cigarettes, chewing tobacco, or electronic cigarettes. If you smoke, quit. Ask your health care provider for help with quitting smoking. Alternative Medicines to Warfarin: Factor Xa Inhibitor Medicines  These blood thinning medicines are taken by mouth, usually for several weeks or longer. It is important to take the medicine every single day, at the same time each day.  There are no regular blood tests required when using these medicines.  There are fewer food and drug interactions than with warfarin.  The side effects of this class of medicine is similar to that of warfarin, including excessive bruising or bleeding. Ask your health care provider or pharmacist about other potential side effects. SEEK MEDICAL CARE IF:   You notice a rapid heartbeat.  You feel weaker or more tired than usual.  You feel faint.  You notice increased bruising.  Your symptoms are not getting better in the time expected.  You are having side effects of medicine. SEEK IMMEDIATE MEDICAL CARE IF:   You have chest pain.  You have trouble breathing.  You have new or increased swelling or pain in one leg.  You cough up blood.  You notice blood in vomit, in a bowel movement, or in urine.  You have a fever. Symptoms of PE may represent a serious problem that is an emergency. Do not wait to see if the symptoms will go away. Get medical help right away. Call your local emergency services (911 in the [place]). Do not drive yourself to the hospital. Document Released: 04/07/2000 Document Revised: 08/25/2013 Document Reviewed: 04/21/2013 [facility] Patient Information 2015 [place], [place]. This information is not intended to replace advice  given to you by your health care provider. Make sure you discuss any questions you have with your health care provider.

## 2014-01-08 NOTE — Assessment & Plan Note (Signed)
Patient's presentation was a bit atypical and it was not comfortable with her description of a chest pressure. She has a history of pulmonary embolism. So I decided to send her downstairs to the emergency department. So far what I have reviewed appears that she had small left-sided pulmonary embolism. It is unclear if the patient was sent to the hospital. We'll review further records when they're completed.

## 2014-01-08 NOTE — Telephone Encounter (Signed)
Pt is requesting refill for Temazepam.   Last OV: 01/08/2014 w/ [facility] Last Fill: 12/09/2013 UDS: None  Please advise.

## 2014-01-08 NOTE — ED Notes (Signed)
Cough x 4 days, received flu shot yesterday and developed chest pressure today.  Seen by PMS this am and sent to [facility] for follow up.

## 2014-01-09 ENCOUNTER — Telehealth: Payer: Self-pay | Admitting: Medical

## 2014-01-09 MED ORDER — TEMAZEPAM 30 MG PO CAPS: 30.0000 mg | ORAL_CAPSULE | Freq: Every evening | ORAL | Status: DC | PRN

## 2014-01-09 NOTE — Telephone Encounter (Signed)
Faxed to [facility]

## 2014-01-09 NOTE — Telephone Encounter (Addendum)
Done, uds on RTC

## 2014-01-09 NOTE — Telephone Encounter (Signed)
Requesting to speak with PA, has questions regarding appt yesterday

## 2014-01-09 NOTE — Telephone Encounter (Signed)
I left message on both pt cell and home phone. She had asked to speak with me so I was returning her phone call. Left number of our office for her to call back.

## 2014-01-09 NOTE — Addendum Note (Signed)
Addended by: Kathlene November E on: 01/09/2014 11:10 AM   Modules accepted: Orders

## 2014-01-12 ENCOUNTER — Telehealth: Payer: Self-pay | Admitting: Medical

## 2014-01-12 NOTE — Telephone Encounter (Addendum)
Pt is calling back,  Please call patient home [phone].

## 2014-01-12 NOTE — Telephone Encounter (Signed)
I have tried numerous times to call pt. Would you call pt and get her on the phone then I will talk with her.Thanks.

## 2014-01-12 NOTE — Telephone Encounter (Signed)
Pt is calling back, states to please return her call at [phone]. Pt states she will be at this number until 4, and then she will be at the home number.

## 2014-01-13 NOTE — Telephone Encounter (Signed)
I did talk with pt. She is feeling ok. No chest pain, no sob reported. No black stools. She had question if she could work. She has very light job. So advised could work. But no working out. Follow up late this week or early next week with Dr. Larose Kells or myself.

## 2014-01-13 NOTE — Telephone Encounter (Signed)
Patient returned phone call. Best # [phone] during the day. If this evening call [phone]

## 2014-01-14 NOTE — Telephone Encounter (Signed)
Left message for patient to call back. Advised ES was seeing patients at this time and I was calling to see if there was anything she needed.

## 2014-01-15 NOTE — Telephone Encounter (Signed)
Called patient who state she spoke with ES. Says she will call back and shedule appointment with Dr. Larose Kells who is her PCP.

## 2014-01-17 ENCOUNTER — Encounter: Payer: Self-pay | Admitting: Internal Medicine

## 2014-01-20 ENCOUNTER — Encounter: Payer: Self-pay | Admitting: Internal Medicine

## 2014-01-20 ENCOUNTER — Ambulatory Visit (INDEPENDENT_AMBULATORY_CARE_PROVIDER_SITE_OTHER): Payer: Federal, State, Local not specified - PPO | Admitting: Internal Medicine

## 2014-01-20 VITALS — BP 120/68 | HR 84 | Ht 68.0 in | Wt 258.1 lb

## 2014-01-20 DIAGNOSIS — R1011 Right upper quadrant pain: Secondary | ICD-10-CM

## 2014-01-20 DIAGNOSIS — R1310 Dysphagia, unspecified: Secondary | ICD-10-CM | POA: Insufficient documentation

## 2014-01-20 DIAGNOSIS — R1314 Dysphagia, pharyngoesophageal phase: Secondary | ICD-10-CM

## 2014-01-20 HISTORY — DX: Dysphagia, pharyngoesophageal phase: R13.14

## 2014-01-20 MED ORDER — HYOSCYAMINE SULFATE 0.125 MG SL SUBL: 0.1250 mg | SUBLINGUAL_TABLET | SUBLINGUAL | Status: DC | PRN

## 2014-01-20 NOTE — Progress Notes (Signed)
   Subjective:    Patient ID: Jennifer [name], female    DOB: 06/21/1950, 63 y.o.   MRN: [id number]  HPI Jennifer [name] is here for followup consultation at the request of Dr. Larose Kells and his team. She has transferred his care after Dr. Linda Hedges retired. Having recurrent dysphagia. Not really new. Also increasing RUQ pain, flank pain. Recent dx PE   Intermittent cramps in the abdomen, sometimes with defecation, in her typical IBS pattern. She is out of her hyoscyamine which does help. Medications, allergies, past medical history, past surgical history, family history and social history are reviewed and updated in the EMR.  Review of Systems + cough, she says she is bruising easily even before the Xarelto    Objective:   Physical Exam General:  NAD Eyes:   anicteric Lungs:  Clear Chest wall: Nontender Heart:  S1S2 no rubs, murmurs or gallops Abdomen:  soft and mildly tender RUQ and epigastrium, BS+, tenderness not present with muscle tension Ext:   no edema but there is a small area of swelling near the lateral malleolus on the right and above that she says   comes from trauma a month or 2 ago Psych:  Appropriate mood and affect Neuro: Alert O. x3 cranial nerves II through XII grossly intact    Data Reviewed:   Emergency room, primary care notes. Labs, imaging in the chart.    Assessment & Plan:  IRRITABLE BOWEL SYNDROME Renew hyoscyamine and try FODMAPS  RUQ pain CT urogram in July did not reveal any problems that could cause pain. There were no kidney stones. She does have a ventral hernia but that is not incarcerated no source of pain. I will evaluate with an EGD ? Change PPI pending results the EGD Refill hyoscyamine when necessary  Dysphagia esophageal phase - chronic after 3 fundoplications Do not think this is repairable ? If medical Tx could make a difference (Buspar, other) ? manometry   The risks and benefits as well as alternatives of endoscopic procedure(s) have been discussed  and reviewed. All questions answered. The patient agrees to proceed.  CC: Kathlene November, MD

## 2014-01-20 NOTE — Assessment & Plan Note (Signed)
Renew hyoscyamine and try FODMAPS

## 2014-01-20 NOTE — Patient Instructions (Addendum)
You have been scheduled for an endoscopy. Please follow written instructions given to you at your visit today. If you use inhalers (even only as needed), please bring them with you on the day of your procedure. Stay on your xarelto for the procedure.  We have sent the following medications to your pharmacy for you to pick up at your convenience: Generic Levsin  Today we are giving you a FODMAP to read over and try.  I appreciate the opportunity to care for you.

## 2014-01-20 NOTE — Assessment & Plan Note (Addendum)
CT urogram in July did not reveal any problems that could cause pain. There were no kidney stones. She does have a ventral hernia but that is not incarcerated no source of pain. I will evaluate with an EGD ? Change PPI pending results the EGD Refill hyoscyamine when necessary

## 2014-01-20 NOTE — Assessment & Plan Note (Signed)
Do not think this is repairable ? If medical Tx could make a difference (Buspar, other) ? manometry

## 2014-01-29 ENCOUNTER — Ambulatory Visit (AMBULATORY_SURGERY_CENTER): Payer: Federal, State, Local not specified - PPO | Admitting: Internal Medicine

## 2014-01-29 ENCOUNTER — Encounter: Payer: Self-pay | Admitting: Internal Medicine

## 2014-01-29 VITALS — BP 133/77 | HR 63 | Temp 96.8°F | Resp 22 | Ht 68.0 in | Wt 258.0 lb

## 2014-01-29 DIAGNOSIS — R1011 Right upper quadrant pain: Secondary | ICD-10-CM

## 2014-01-29 MED ORDER — SODIUM CHLORIDE 0.9 % IV SOLN: 500.0000 mL | INTRAVENOUS | Status: DC

## 2014-01-29 NOTE — Progress Notes (Signed)
Pt's c/o pain to her "diaphragm" area rating as a 3-4  She states this isn't abnormal for her and doesn't feel any different.  No difficulty swallowing or c/o SOB.  [name] made aware and ok to discharge

## 2014-01-29 NOTE — Patient Instructions (Addendum)
Things look ok today. As in the past it is difficult to say where the pain comes from.  If you are inclined we could try a medication called duloxetine. It is indicated to treat pain, anxiety, depression. I would be using it for pain. It is usually well tolerated - would take 2-3 months before we know it works.  Let me know if you want to try it.  I appreciate the opportunity to care for you. Gatha Mayer, MD, FACG   YOU HAD AN ENDOSCOPIC PROCEDURE TODAY AT [facility]: Refer to the procedure report that was given to you for any specific questions about what was found during the examination.  If the procedure report does not answer your questions, please call your gastroenterologist to clarify.  If you requested that your care partner not be given the details of your procedure findings, then the procedure report has been included in a sealed envelope for you to review at your convenience later.  YOU SHOULD EXPECT: Some feelings of bloating in the abdomen. Passage of more gas than usual.  Walking can help get rid of the air that was put into your GI tract during the procedure and reduce the bloating  DIET: Your first meal following the procedure should be a light meal and then it is ok to progress to your normal diet.  A half-sandwich or bowl of soup is an example of a good first meal.  Heavy or fried foods are harder to digest and may make you feel nauseous or bloated.  Likewise meals heavy in dairy and vegetables can cause extra gas to form and this can also increase the bloating.  Drink plenty of fluids but you should avoid alcoholic beverages for 24 hours.  ACTIVITY: Your care partner should take you home directly after the procedure.  You should plan to take it easy, moving slowly for the rest of the day.  You can resume normal activity the day after the procedure however you should NOT DRIVE or use heavy machinery for 24 hours (because of the sedation medicines  used during the test).    SYMPTOMS TO REPORT IMMEDIATELY: A gastroenterologist can be reached at any hour.  During normal business hours, 8:30 AM to 5:00 PM Monday through Friday, call [phone].  After hours and on weekends, please call the GI answering service at [phone] who will take a message and have the physician on call contact you.   Following upper endoscopy (EGD)  Vomiting of blood or coffee ground material  New chest pain or pain under the shoulder blades  Painful or persistently difficult swallowing  New shortness of breath  Fever of 100F or higher  Black, tarry-looking stools  FOLLOW UP:.  Our staff will call the home number listed on your records the next business day following your procedure to check on you and address any questions or concerns that you may have at that time regarding the information given to you following your procedure. This is a courtesy call and so if there is no answer at the home number and we have not heard from you through the emergency physician on call, we will assume that you have returned to your regular daily activities without incident.  SIGNATURES/CONFIDENTIALITY: You and/or your care partner have signed paperwork which will be entered into your electronic medical record.  These signatures attest to the fact that that the information above on your After Visit Summary has been reviewed and is  understood.  Full responsibility of the confidentiality of this discharge information lies with you and/or your care-partner.  Continue your normal medications

## 2014-01-29 NOTE — Progress Notes (Signed)
Report to PACU, RN, vss, BBS= Clear.  

## 2014-01-29 NOTE — Progress Notes (Signed)
Patient stated she was recently treated for bronchitis. Patient completed the z-pack. No fever.

## 2014-01-29 NOTE — Op Note (Signed)
[place]  [address] [place], [address]   ENDOSCOPY PROCEDURE REPORT  PATIENT: Jennifer [name], Jennifer [name]  MR#: [id number] BIRTHDATE: 07-18-1950 , 63  yrs. old GENDER: female ENDOSCOPIST: Gatha Mayer, MD, [facility] PROCEDURE DATE:  01/29/2014 PROCEDURE:  EGD, diagnostic ASA CLASS:     Class III INDICATIONS:  abdominal pain in the upper right quadrant. MEDICATIONS: Propofol 100 mg IV and Monitored anesthesia care TOPICAL ANESTHETIC: none  DESCRIPTION OF PROCEDURE: After the risks benefits and alternatives of the procedure were thoroughly explained, informed consent was obtained.  The LB [id number] endoscope was introduced through the mouth and advanced to the second portion of the duodenum , Without limitations.  The instrument was slowly withdrawn as the mucosa was fully examined.    STOMACH: An anti-reflux surgical site was found in the cardia characterized as healthy in appearance.    Otherwise normal EGD Retroflexed views revealed as previously described.     The scope was then withdrawn from the patient and the procedure completed.  COMPLICATIONS: There were no immediate complications.  ENDOSCOPIC IMPRESSION: 1.   Anti-reflux surgical site was found in the cardia 2.   Otherwise normal EGD  RECOMMENDATIONS: Consider adding duloxetine to treat her chronic visceral pain  - see me prn   eSigned:  Gatha Mayer, MD, [facility] 01/29/2014 9:50 AM    CC: The Patient

## 2014-01-30 ENCOUNTER — Telehealth: Payer: Self-pay | Admitting: *Deleted

## 2014-01-30 NOTE — Telephone Encounter (Signed)
No answer, message left for the patient. 

## 2014-02-04 ENCOUNTER — Encounter: Payer: Self-pay | Admitting: Internal Medicine

## 2014-02-04 ENCOUNTER — Ambulatory Visit (INDEPENDENT_AMBULATORY_CARE_PROVIDER_SITE_OTHER): Payer: Federal, State, Local not specified - PPO | Admitting: Internal Medicine

## 2014-02-04 VITALS — BP 142/82 | HR 81 | Temp 98.0°F | Wt 261.4 lb

## 2014-02-04 DIAGNOSIS — K589 Irritable bowel syndrome without diarrhea: Secondary | ICD-10-CM

## 2014-02-04 DIAGNOSIS — M797 Fibromyalgia: Secondary | ICD-10-CM

## 2014-02-04 DIAGNOSIS — I2699 Other pulmonary embolism without acute cor pulmonale: Secondary | ICD-10-CM

## 2014-02-04 MED ORDER — DULOXETINE HCL 30 MG PO CPEP: 30.0000 mg | ORAL_CAPSULE | Freq: Two times a day (BID) | ORAL | Status: DC

## 2014-02-04 MED ORDER — RIVAROXABAN 20 MG PO TABS: 20.0000 mg | ORAL_TABLET | Freq: Every day | ORAL | Status: DC

## 2014-02-04 NOTE — Patient Instructions (Signed)
Start duloxetine 1 tablet daily for 2 weeks, then one tablet twice a day  Next visit in 3 months

## 2014-02-04 NOTE — Assessment & Plan Note (Signed)
Patient continue her GI symptoms as before, recently seen by a GI, EGD was essentially unrevealing. They were considering fluoxetine for chronic visceral pain. Plan: Start Cymbalta, see comments under fibromyalgia

## 2014-02-04 NOTE — Progress Notes (Signed)
Subjective:    Patient ID: Jennifer [name], female    DOB: [birth year], 63 y.o.   MRN: [id number]  DOS:  02/04/2014 Type of visit - description : f/u Interval history:  Since the last visit  12/26/2013 she was seen by another provider at this office 01-08-14, had atypical chest pain, eventually a CT showed  a small left-sided PE. She is on xarelto.  On 01/20/2014 saw GI with abdominal pain, chronic dysphagia , Right upper quadrant abdominal pain without clear etiology, IBS. EGD was performed 01/29/2014, essentially negative, GI rec f/u prn w/ them and consider Cymbalta for visceral pain.    ROS At this point, patient chest pain is resolved. She continue with abdominal pain as described by the GI note She continue shortness of breath and leg pain. Good compliance with xarelto, denies red blood per rectum or blood in her urine    Past Medical History  Diagnosis Date  . Esophageal stenosis     Stricture after fundoplication REDO  . Tinnitus     Subjective  . Mitral valve prolapse   . GERD (gastroesophageal reflux disease)   . Costochondritis   . OSA (obstructive sleep apnea)     Mild - Sleep study 17-Aug-2002  . Pulmonary embolus     2008/08/16 and 12-2013  . Chronic chest pain   . Dysphasia     Chronic  . Shortness of breath     with exertion  . Hypertension   . Heart murmur   . IBS (irritable bowel syndrome)   . Anemia   . Colon polyps     inflamed partially ulcerated hyperplastic polyp  . Diverticulosis   . Hemorrhoids   . Zoster 08/16/12    R flank  . Diabetes 09/24/2013  . Dysphagia esophageal phase - chronic after 3 fundoplications 08/27/3974  . Sleep apnea   . Anxiety     Past Surgical History  Procedure Laterality Date  . Brain surgery  1993-08-16    Stem surgery, Arnold Chiari Malformation  . Breast reduction surgery    . Appendectomy    . Abdominal hysterectomy      still has 1 ovary   . Nissen fundoplication      X 3 lab, open x 2 last with mesh  . Cholecystectomy  Aug 17, 2006  .  Hernia repair      Hital Hernia  . Knee arthroscopy  05/16/2012    Procedure: ARTHROSCOPY KNEE;  Surgeon: Sharmon Revere, MD;  Location: [facility];  Service: Orthopedics;  Laterality: Left;  . Colonoscopy    . Esophagogastroduodenoscopy      History   Social History  . Marital Status: Married    Spouse Name: N/A    Number of Children: 1  . Years of Education: N/A   Occupational History  . [employer]   . TRAINING [employer]    Social History Main Topics  . Smoking status: Never Smoker   . Smokeless tobacco: Never Used  . Alcohol Use: No  . Drug Use: No  . Sexual Activity: Yes    Birth Control/ Protection: None   Other Topics Concern  . Not on file   Social History Narrative   [employer] Graduate - Technical school   Married Aug 16, 2068 - 2 years divorced; married 08/16/84 - 2.5 years divorced; married 16-Aug-1988 - 1 year divorced; married '00   1 son - 17-Aug-2070   Sister - Died @ 31 [place]  Medication List       This list is accurate as of: 02/04/14  7:10 PM.  Always use your most recent med list.               cycloSPORINE 0.05 % ophthalmic emulsion  Commonly known as:  RESTASIS  Place 1 drop into both eyes daily.     DULoxetine 30 MG capsule  Commonly known as:  CYMBALTA  Take 1 capsule (30 mg total) by mouth 2 (two) times daily.     glucose blood test strip  Commonly known as:  ONE TOUCH ULTRA TEST  Check once daily     hyoscyamine 0.125 MG SL tablet  Commonly known as:  LEVSIN SL  Place 1 tablet (0.125 mg total) under the tongue every 4 (four) hours as needed.     onetouch ultrasoft lancets  Check once daily     RABEprazole 20 MG tablet  Commonly known as:  ACIPHEX  Take 20 mg by mouth 2 (two) times daily.     rivaroxaban 20 MG Tabs tablet  Commonly known as:  XARELTO  Take 1 tablet (20 mg total) by mouth daily with supper.     temazepam 30 MG capsule  Commonly known as:  RESTORIL  Take 1 capsule (30 mg total) by mouth at bedtime as needed for sleep.      triamterene-hydrochlorothiazide 37.5-25 MG per tablet  Commonly known as:  MAXZIDE-25  Take 0.5 tablets by mouth daily.           Objective:   Physical Exam BP 142/82  Pulse 81  Temp(Src) 98 F (36.7 C) (Oral)  Wt 261 lb 6 oz (118.559 kg)  SpO2 91% General -- alert, well-developed, NAD.   HEENT-- Not pale.   Lungs -- normal respiratory effort, no intercostal retractions, no accessory muscle use, and normal breath sounds.  Heart-- normal rate, regular rhythm, no murmur.  Abdomen-- Not distended, good bowel sounds,soft, non-tender. Extremities-- no pretibial edema bilaterally  Neurologic--  alert & oriented X3. Speech normal, gait appropriate for age, strength symmetric and appropriate for age.  Psych-- Cognition and judgment appear intact. Cooperative with normal attention span and concentration. No anxious or depressed appearing.     Assessment & Plan:     Today , I spent more than 25   min with the patient: >50% of the time counseling regards to a diagnosis of fibromyalgia, the need to see hematology. Also reviewing the chart and labs ordered by other providers

## 2014-02-04 NOTE — Progress Notes (Signed)
Pre visit review using our clinic review tool, if applicable. No additional management support is needed unless otherwise documented below in the visit note. 

## 2014-02-04 NOTE — Assessment & Plan Note (Addendum)
The patient continue with chronic leg pain described as  Severe; had a normal vascular exam (OV 12-26-13), normal CKs, not on statins. This happened in the context of chronic not well explained abdominal pain. Fibromyalgia? Somatization?. Recommended to start duloxetine and reassess in 3 months. Additionally, she' was recommended diclofenac for pain per another provided , I recommend to discontinue that medication since she is anticoagulated

## 2014-02-04 NOTE — Assessment & Plan Note (Addendum)
The patient was recently seen w/ chest pain, CT show a small left-sided pulmonary emboli.  Had  a provoked PE in 2010, I would consider the recent PE  Unprovoked  Plan:  Continue with xarelto Refer to hematology, long-term anticoagulation?

## 2014-02-19 ENCOUNTER — Encounter: Payer: Self-pay | Admitting: Family

## 2014-02-19 ENCOUNTER — Ambulatory Visit: Payer: Federal, State, Local not specified - PPO | Admitting: Lab

## 2014-02-19 ENCOUNTER — Telehealth: Payer: Self-pay | Admitting: Internal Medicine

## 2014-02-19 ENCOUNTER — Ambulatory Visit (HOSPITAL_BASED_OUTPATIENT_CLINIC_OR_DEPARTMENT_OTHER)
Admission: RE | Admit: 2014-02-19 | Discharge: 2014-02-19 | Disposition: A | Discharge status: Home / Self Care | Payer: Federal, State, Local not specified - PPO | Source: Ambulatory Visit | Attending: Family | Admitting: Family

## 2014-02-19 ENCOUNTER — Telehealth: Payer: Self-pay | Admitting: Hematology & Oncology

## 2014-02-19 ENCOUNTER — Ambulatory Visit (HOSPITAL_BASED_OUTPATIENT_CLINIC_OR_DEPARTMENT_OTHER): Payer: Federal, State, Local not specified - PPO | Admitting: Family

## 2014-02-19 ENCOUNTER — Ambulatory Visit: Payer: Federal, State, Local not specified - PPO

## 2014-02-19 VITALS — BP 145/84 | HR 77 | Temp 98.1°F | Resp 14 | Ht 68.0 in | Wt 257.0 lb

## 2014-02-19 DIAGNOSIS — M79605 Pain in left leg: Secondary | ICD-10-CM | POA: Insufficient documentation

## 2014-02-19 DIAGNOSIS — M79604 Pain in right leg: Secondary | ICD-10-CM

## 2014-02-19 DIAGNOSIS — Z7901 Long term (current) use of anticoagulants: Secondary | ICD-10-CM

## 2014-02-19 DIAGNOSIS — G4730 Sleep apnea, unspecified: Secondary | ICD-10-CM

## 2014-02-19 DIAGNOSIS — I2699 Other pulmonary embolism without acute cor pulmonale: Secondary | ICD-10-CM

## 2014-02-19 DIAGNOSIS — R609 Edema, unspecified: Secondary | ICD-10-CM | POA: Diagnosis present

## 2014-02-19 DIAGNOSIS — M7121 Synovial cyst of popliteal space [Baker], right knee: Secondary | ICD-10-CM | POA: Insufficient documentation

## 2014-02-19 NOTE — Telephone Encounter (Signed)
Letter printed and ready for pick up. LMOM for Pt to call back.

## 2014-02-19 NOTE — Progress Notes (Addendum)
Hematology/Oncology Consultation   Name: Jennifer [name]      MRN: [id number]    Location: Room/bed info not found  Date: 02/19/2014 Time:5:04 PM   REFERRING PHYSICIAN:  Clay[place]:  Second PE, may need long term coagulation   DIAGNOSIS:  Bilateral PE  HISTORY OF PRESENT ILLNESS:  Jennifer [name] is a very pleasant 63 yo female with bilateral PEs. She first had a PE 3 years ago in her right lower lobe. This presented after a fall with SOB and back pain. She was treated with Lovenox and the Coumadin. Her latest PE was found during a routine visit with her PE. She has SOB with exertion and has been dizzy at times. She has a headache at times. She has issues with bruising easily and her bruises are "raised and hard." She states that she is anemic but not taking anything currently. She Has no personal cancer history. Her grandmother had colon cancer, cousins had leukemia and another relative with lung cancer. She has no clotting disorder that she is aware of. She has one son and had no miscarriages. She no longer has cycles. She had a hysterectomy when she was 28 because of severe endometriosis. She has one ovary left. She had a vein in her leg stripped because of blood pooling. She denies fever, chills, n/v, cough, rash, chest pain, palpitations, abdominal pain, constipation, diarrhea, blood in urine ro stool. She has pain and swelling in her legs. She has no other swelling, tenderness, numbness or tingling in her extremities. Her appetite is good and she is well hydrated.    CURRENT TREATMENT: Xarelto 20 mg daily  ROS: All other 10 point review of systems is negative.   PAST MEDICAL HISTORY:   Past Medical History  Diagnosis Date  . Esophageal stenosis     Stricture after fundoplication REDO  . Tinnitus     Subjective  . Mitral valve prolapse   . GERD (gastroesophageal reflux disease)   . Costochondritis   . OSA (obstructive sleep apnea)     Mild - Sleep study 2004  . Pulmonary  embolus     2010 and 12-2013  . Chronic chest pain   . Dysphasia     Chronic  . Shortness of breath     with exertion  . Hypertension   . Heart murmur   . IBS (irritable bowel syndrome)   . Anemia   . Colon polyps     inflamed partially ulcerated hyperplastic polyp  . Diverticulosis   . Hemorrhoids   . Zoster 2014    R flank  . Diabetes 09/24/2013  . Dysphagia esophageal phase - chronic after 3 fundoplications 1/57/2620  . Sleep apnea   . Anxiety     ALLERGIES: Allergies  Allergen Reactions  . Morphine Anaphylaxis  . Morphine And Related Shortness Of Breath  . Promethazine Hcl Other (See Comments)    Hallucinations  . Sulfonamide Derivatives Hives      MEDICATIONS:  Current Outpatient Prescriptions on File Prior to Visit  Medication Sig Dispense Refill  . cycloSPORINE (RESTASIS) 0.05 % ophthalmic emulsion Place 1 drop into both eyes daily.      . DULoxetine (CYMBALTA) 30 MG capsule Take 1 capsule (30 mg total) by mouth 2 (two) times daily.  60 capsule  3  . glucose blood (ONE TOUCH ULTRA TEST) test strip Check once daily  100 each  12  . hyoscyamine (LEVSIN SL) 0.125 MG SL tablet Place 1  tablet (0.125 mg total) under the tongue every 4 (four) hours as needed.  30 tablet  2  . Lancets (ONETOUCH ULTRASOFT) lancets Check once daily  100 each  12  . RABEprazole (ACIPHEX) 20 MG tablet Take 20 mg by mouth 2 (two) times daily.      . rivaroxaban (XARELTO) 20 MG TABS tablet Take 1 tablet (20 mg total) by mouth daily with supper.  30 tablet  6  . temazepam (RESTORIL) 30 MG capsule Take 1 capsule (30 mg total) by mouth at bedtime as needed for sleep.  30 capsule  3  . triamterene-hydrochlorothiazide (MAXZIDE-25) 37.5-25 MG per tablet Take 0.5 tablets by mouth daily.       No current facility-administered medications on file prior to visit.     PAST SURGICAL HISTORY Past Surgical History  Procedure Laterality Date  . Brain surgery  1995    Stem surgery, Arnold Chiari  Malformation  . Breast reduction surgery    . Appendectomy    . Abdominal hysterectomy      still has 1 ovary   . Nissen fundoplication      X 3 lab, open x 2 last with mesh  . Cholecystectomy  2008  . Hernia repair      Hital Hernia  . Knee arthroscopy  05/16/2012    Procedure: ARTHROSCOPY KNEE;  Surgeon: Sharmon Revere, MD;  Location: [facility];  Service: Orthopedics;  Laterality: Left;  . Colonoscopy    . Esophagogastroduodenoscopy      FAMILY HISTORY: Family History  Problem Relation Age of Onset  . Diabetes Mother   . Rheum arthritis Mother   . Hypertension Mother   . Hypertension Father   . Rheum arthritis Sister   . Hypertension Sister   . Colon cancer Maternal Grandmother   . Rheum arthritis Sister   . Rheum arthritis Sister   . Rheum arthritis Sister    SOCIAL HISTORY:  reports that she has never smoked. She has never used smokeless tobacco. She reports that she does not drink alcohol or use illicit drugs.  PERFORMANCE STATUS: The patient's performance status is 1 - Symptomatic but completely ambulatory  PHYSICAL EXAM: Most Recent Vital Signs: Blood pressure 145/84, pulse 77, temperature 98.1 F (36.7 C), temperature source Oral, resp. rate 14, height 5\' 8"  (1.727 m), weight 257 lb (116.574 kg). BP 145/84  Pulse 77  Temp(Src) 98.1 F (36.7 C) (Oral)  Resp 14  Ht 5\' 8"  (1.727 m)  Wt 257 lb (116.574 kg)  BMI 39.09 kg/m2  General Appearance:    Alert, cooperative, no distress, appears stated age  Head:    Normocephalic, without obvious abnormality, atraumatic  Eyes:    PERRL, conjunctiva/corneas clear, EOM's intact, fundi    benign, both eyes        Throat:   Lips, mucosa, and tongue normal; teeth and gums normal  Neck:   Supple, symmetrical, trachea midline, no adenopathy;    thyroid:  no enlargement/tenderness/nodules; no carotid   bruit or JVD  Back:     Symmetric, no curvature, ROM normal, no CVA tenderness  Lungs:     Clear to auscultation  bilaterally, respirations unlabored  Chest Wall:    No tenderness or deformity   Heart:    Regular rate and rhythm, S1 and S2 normal, no murmur, rub   or gallop  Breast Exam:    No tenderness, masses, or nipple abnormality  Abdomen:     Soft, non-tender, bowel sounds active all  four quadrants,    no masses, no organomegaly        Extremities:   Extremities normal, atraumatic, no cyanosis or edema  Pulses:   2+ and symmetric all extremities  Skin:   Skin color, texture, turgor normal, no rashes or lesions  Lymph nodes:   Cervical, supraclavicular, and axillary nodes normal  Neurologic:   CNII-XII intact, normal strength, sensation and reflexes    throughout   LABORATORY DATA:  No results found for this or any previous visit (from the past 48 hour(s)).    RADIOGRAPHY: US Venous Img Lower Bilateral  02/19/2014   CLINICAL DATA:  Bilateral leg swelling with right leg pain  EXAM: BILATERAL LOWER EXTREMITY VENOUS DOPPLER ULTRASOUND  TECHNIQUE: Gray-scale sonography with graded compression, as well as color Doppler and duplex ultrasound were performed to evaluate the lower extremity deep venous systems from the level of the common femoral vein and including the common femoral, femoral, profunda femoral, popliteal and calf veins including the posterior tibial, peroneal and gastrocnemius veins when visible. The superficial great saphenous vein was also interrogated. Spectral Doppler was utilized to evaluate flow at rest and with distal augmentation maneuvers in the common femoral, femoral and popliteal veins.  COMPARISON:  None.  FINDINGS: RIGHT LOWER EXTREMITY  Common Femoral Vein: No evidence of thrombus. Normal compressibility, respiratory phasicity and response to augmentation.  Saphenofemoral Junction: No evidence of thrombus. Normal compressibility and flow on color Doppler imaging.  Profunda Femoral Vein: No evidence of thrombus. Normal compressibility and flow on color Doppler imaging.  Femoral  Vein: No evidence of thrombus. Normal compressibility, respiratory phasicity and response to augmentation.  Popliteal Vein: No evidence of thrombus. Normal compressibility, respiratory phasicity and response to augmentation.  Calf Veins: No evidence of thrombus. Normal compressibility and flow on color Doppler imaging.  Superficial Great Saphenous Vein: No evidence of thrombus. Normal compressibility and flow on color Doppler imaging.  Venous Reflux:  None.  Other Findings:  None.  LEFT LOWER EXTREMITY  Common Femoral Vein: No evidence of thrombus. Normal compressibility, respiratory phasicity and response to augmentation.  Saphenofemoral Junction: No evidence of thrombus. Normal compressibility and flow on color Doppler imaging.  Profunda Femoral Vein: No evidence of thrombus. Normal compressibility and flow on color Doppler imaging.  Femoral Vein: No evidence of thrombus. Normal compressibility, respiratory phasicity and response to augmentation.  Popliteal Vein: No evidence of thrombus. Normal compressibility, respiratory phasicity and response to augmentation.  Calf Veins: No evidence of thrombus. Normal compressibility and flow on color Doppler imaging.  Superficial Great Saphenous Vein: No evidence of thrombus. Normal compressibility and flow on color Doppler imaging.  Venous Reflux:  None.  Other Findings: Cystic structure is noted in the left popliteal fossa consistent with a popliteal cyst.  IMPRESSION: No deep venous thrombosis bilaterally.  Left popliteal cyst.   Electronically Signed   By: Inez Catalina M.D.   On: 02/19/2014 12:50     PATHOLOGY: None  ASSESSMENT/PLAN: Ms. Lizer is a very pleasant 63 yo female with bilateral PEs. She had a PE 3 years ago in her right lower lobe treated with Lovenox and Coumadin. She is currently on Xarelto daily. She is having SOB at times.  We got a doppler study of her legs and it was negative for DVTs.  We will wait and see what her hypercoagulable studies show.   She will more than likely need to be on some form of lifelong anticoagulation.  We will see her back for labs and  follow-up in 6 weeks and decide at that time when she needs to have repeat scans. All questions were answered. She knows to call the clinic with any problems, questions or concerns. We can certainly see her much sooner if necessary. The patient was discussed with [name] and he is in agreement with the aforementioned.   [facility]    ADDENDUM:  I agree with the above assessment by Judson Roch. We saw and examined the patient. This is her second pulmonary embolism. She will need lifelong anticoagulation. I cannot find any hypercoagulable studies that a been done. 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 I cannot find any risk factors that she has. She does not smoke. She does not have diabetes.  She does have sleep apnea which might be a risk factor.  A Doppler of her legs is reasonable.  We will see her back in 6 weeks and see how she is doing. I think if all looks stable, then maybe we can let her go from the clinic at that time as we really would not be providing her any additional benefit outside of what her family doctor is doing.

## 2014-02-19 NOTE — Telephone Encounter (Signed)
Recent PE, okay to send the following letter  To whom it may concern, Jennifer [name] is a patient of mine, recently she had some health challenges and in my opinion she does not need to be exercising with a personal trainer at the moment

## 2014-02-19 NOTE — Telephone Encounter (Signed)
Please advise 

## 2014-02-19 NOTE — Telephone Encounter (Signed)
Left vm w NEW PATIENT today to remind them of their appointment with Dr. Ennever. Also, advised them to bring all medication bottles and insurance card information. ° °

## 2014-02-19 NOTE — Telephone Encounter (Signed)
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  THEY WILL NOT STOP WITHOUT A LETTER FROM HER DOCTOR

## 2014-02-20 ENCOUNTER — Telehealth: Payer: Self-pay | Admitting: Internal Medicine

## 2014-02-20 NOTE — Telephone Encounter (Signed)
Caller name: Shakyla, Nolley Relation to pt: self  Call back number: [phone]   Reason for call:  Pt returning your call and would like to p/u letter. (letter is not a the front desk) pt would like you to call her at work

## 2014-02-20 NOTE — Telephone Encounter (Signed)
Pt would to discuss letter Dr. Larose Kells wrote for her regarding restrictions. Please call home [phone]

## 2014-02-20 NOTE — Telephone Encounter (Signed)
LMOM for Pt to inform her that letter has been placed at front desk for pick up.

## 2014-02-23 ENCOUNTER — Encounter: Payer: Self-pay | Admitting: Family

## 2014-02-23 NOTE — Telephone Encounter (Signed)
LMOM for Pt to return call.  

## 2014-02-23 NOTE — Telephone Encounter (Signed)
Spoke with Pt, she is requesting another letter not only for personal trainer but for the gym. Letter written and printed as requested and placed at front desk for pickup. Pt stated she would pick it up around 4 pm.

## 2014-02-24 LAB — HYPERCOAGULABLE PANEL, COMPREHENSIVE
ANTICARDIOLIPIN IGA: 7 U/mL (ref <22)
ANTITHROMB III FUNC: 83 % (ref 76–126)
Anticardiolipin IgG: 8 GPL U/mL (ref <23)
Anticardiolipin IgM: 15 MPL U/mL — ABNORMAL HIGH (ref <11)
BETA-2-GLYCOPROTEIN I IGA: 8 A Units (ref <20)
BETA-2-GLYCOPROTEIN I IGM: 31 M Units — AB (ref <20)
Beta-2 Glyco I IgG: 5 G Units (ref <20)
DRVVT 1:1 Mix: 49.5 secs — ABNORMAL HIGH (ref <42.9)
DRVVT: 67.5 secs — ABNORMAL HIGH (ref <42.9)
Drvvt confirmation: 1.22 Ratio — ABNORMAL HIGH (ref <1.15)
LUPUS ANTICOAGULANT: DETECTED — AB
PROTEIN C, TOTAL: 78 % (ref 72–160)
PTT Lupus Anticoagulant: 57.8 secs — ABNORMAL HIGH (ref 28.0–43.0)
PTTLA 41 MIX: 50.3 s — AB (ref 28.0–43.0)
PTTLA Confirmation: 0 secs (ref <8.0)
Protein C Activity: 163 % — ABNORMAL HIGH (ref 75–133)
Protein S Activity: 121 % (ref 69–129)
Protein S Total: 98 % (ref 60–150)

## 2014-02-27 LAB — HEXAGONAL PHOSPHOLIPID NEUTRALIZATION: Hex Phosph Neut Test: NEGATIVE

## 2014-03-02 ENCOUNTER — Encounter: Payer: Self-pay | Admitting: *Deleted

## 2014-03-06 ENCOUNTER — Other Ambulatory Visit: Payer: Self-pay | Admitting: Internal Medicine

## 2014-04-01 ENCOUNTER — Ambulatory Visit (HOSPITAL_BASED_OUTPATIENT_CLINIC_OR_DEPARTMENT_OTHER): Payer: Federal, State, Local not specified - PPO | Admitting: Hematology & Oncology

## 2014-04-01 ENCOUNTER — Encounter: Payer: Self-pay | Admitting: Hematology & Oncology

## 2014-04-01 ENCOUNTER — Other Ambulatory Visit (HOSPITAL_BASED_OUTPATIENT_CLINIC_OR_DEPARTMENT_OTHER): Payer: Federal, State, Local not specified - PPO | Admitting: Lab

## 2014-04-01 VITALS — BP 160/79 | HR 71 | Temp 98.1°F | Resp 16 | Ht 68.0 in | Wt 252.0 lb

## 2014-04-01 DIAGNOSIS — I2699 Other pulmonary embolism without acute cor pulmonale: Secondary | ICD-10-CM

## 2014-04-01 LAB — CBC WITH DIFFERENTIAL (CANCER CENTER ONLY)
BASO#: 0 10*3/uL (ref 0.0–0.2)
BASO%: 0.7 % (ref 0.0–2.0)
EOS%: 3.1 % (ref 0.0–7.0)
Eosinophils Absolute: 0.1 10*3/uL (ref 0.0–0.5)
HEMATOCRIT: 38.5 % (ref 34.8–46.6)
HEMOGLOBIN: 12.8 g/dL (ref 11.6–15.9)
LYMPH#: 2.2 10*3/uL (ref 0.9–3.3)
LYMPH%: 48.2 % — AB (ref 14.0–48.0)
MCH: 28.1 pg (ref 26.0–34.0)
MCHC: 33.2 g/dL (ref 32.0–36.0)
MCV: 85 fL (ref 81–101)
MONO#: 0.6 10*3/uL (ref 0.1–0.9)
MONO%: 12.8 % (ref 0.0–13.0)
NEUT%: 35.2 % — AB (ref 39.6–80.0)
NEUTROS ABS: 1.6 10*3/uL (ref 1.5–6.5)
Platelets: 253 10*3/uL (ref 145–400)
RBC: 4.55 10*6/uL (ref 3.70–5.32)
RDW: 14.5 % (ref 11.1–15.7)
WBC: 4.5 10*3/uL (ref 3.9–10.0)

## 2014-04-01 NOTE — Progress Notes (Signed)
Hematology and Oncology Follow Up Visit  Jennifer [name] [id number] [birth year] 63 y.o. 04/01/2014   Principle Diagnosis:   Recurrent pulmonary embolism  Current Therapy:    Xarelto 20 mg by mouth daily     Interim History:  [name] is back for follow-up. This is her second office visit. We first saw her back in October. She had a recurrent pulmonary embolism. She had her first one 3 years ago. She was on Coumadin. She then had a second pulmonary embolism. I did this was back in September. She had bilateral nonocclusive thrombi. Of note, a CT angiogram done back in August was normal. Thrombophilic studies were normal outside of a positive lupus anticoagulant. However, this was tested on confirmation and found to be negative. P Raff we did do Doppler of her legs back in late October. These were negative.  She is on Xarelto. I would hate to commit her to life long Xarelto. However, this might be necessary. Perhaps she feels well. She's had no cough. No shortness of breath. She's had no chest wall pain. There is no hemoptysis. There she's had no abdominal pain. There's no change in bowel or bladder habits. She does have sleep apnea. She uses a sleep apnea machine.  She is still working. She may retire next year.    Medications: Current outpatient prescriptions: cycloSPORINE (RESTASIS) 0.05 % ophthalmic emulsion, Place 1 drop into both eyes daily., Disp: , Rfl: ;  DULoxetine (CYMBALTA) 30 MG capsule, Take 1 capsule (30 mg total) by mouth 2 (two) times daily., Disp: 60 capsule, Rfl: 3;  glucose blood (ONE TOUCH ULTRA TEST) test strip, Check once daily, Disp: 100 each, Rfl: 12 hyoscyamine (LEVSIN SL) 0.125 MG SL tablet, Place 1 tablet (0.125 mg total) under the tongue every 4 (four) hours as needed., Disp: 30 tablet, Rfl: 2;  RABEprazole (ACIPHEX) 20 MG tablet, Take 20 mg by mouth 2 (two) times daily., Disp: , Rfl: ;  rivaroxaban (XARELTO) 20 MG TABS tablet, Take 1 tablet (20 mg total) by mouth  daily with supper., Disp: 30 tablet, Rfl: 6 temazepam (RESTORIL) 30 MG capsule, Take 1 capsule (30 mg total) by mouth at bedtime as needed for sleep., Disp: 30 capsule, Rfl: 3;  triamterene-hydrochlorothiazide (MAXZIDE-25) 37.5-25 MG per tablet, Take 0.5 tablets by mouth daily., Disp: , Rfl:   Allergies:  Allergies  Allergen Reactions  . Morphine Anaphylaxis  . Morphine And Related Shortness Of Breath  . Promethazine Hcl Other (See Comments)    Hallucinations  . Sulfonamide Derivatives Hives    Past Medical History, Surgical history, Social history, and Family History were reviewed and updated.  Review of Systems: As above  Physical Exam:  height is 5\' 8"  (1.727 m) and weight is 252 lb (114.306 kg). Her oral temperature is 98.1 F (36.7 C). Her blood pressure is 160/79 and her pulse is 71. Her respiration is 16.   Well-developed and well-nourished African-American female. Head and neck exam shows no ocular or oral lesions. She has no palpable cervical or supraclavicular lymph nodes. Lungs are clear. Cardiac exam regular rate and rhythm with no murmurs, rubs or bruits. Abdomen is soft. She has good bowel sounds. There is no fluid wave. There is no palpable liver or spleen tip. Back exam shows no tenderness over the spine, ribs or hips. Extremities shows no clubbing, cyanosis or edema. No venous cord is noted in the legs. Neurological exam is nonfocal. Skin exam shows no rashes, ecchymoses or petechia.  Lab Results  Component Value Date   WBC 4.5 04/01/2014   HGB 12.8 04/01/2014   HCT 38.5 04/01/2014   MCV 85 04/01/2014   PLT 253 04/01/2014     Chemistry      Component Value Date/Time   NA 143 01/08/2014 1055   K 3.9 01/08/2014 1055   CL 101 01/08/2014 1055   CO2 30 01/08/2014 1055   BUN 13 01/08/2014 1055   CREATININE 0.90 01/08/2014 1055   CREATININE 1.01 11/14/2013 1542      Component Value Date/Time   CALCIUM 9.6 01/08/2014 1055   ALKPHOS 77 01/08/2014 1055   AST 20  01/08/2014 1055   ALT 15 01/08/2014 1055   BILITOT 0.2* 01/08/2014 1055         Impression and Plan: Jennifer [name] is 63 year old African-American female. She has history of recurrent pulmonary emboli. She is idiopathic from her hypercoagulable studies.  For now, I will plan to get her back in another 4 months. I think we probably need to do a CT angiogram we see her back to see if the thrombi have resolved.  We may want to consider 2 years of full dose anticoagulation. After that, then we might go with low-dose anticoagulation with maybe 5 mg of Xarelto daily.  I spent about 30 minutes with her today. I went over her labs and her scans that she had done previously. I explained to her why she might need lifelong anticoagulation but that we will certainly try not to commit her to this.   [name], MD 12/9/20155:24 PM

## 2014-04-02 ENCOUNTER — Telehealth: Payer: Self-pay | Admitting: Hematology & Oncology

## 2014-04-02 NOTE — Telephone Encounter (Signed)
Left pt message times have changed for 4-11 appointment and that I am mailing out schedule with the changes

## 2014-04-29 ENCOUNTER — Encounter: Payer: Self-pay | Admitting: Internal Medicine

## 2014-04-29 ENCOUNTER — Ambulatory Visit (INDEPENDENT_AMBULATORY_CARE_PROVIDER_SITE_OTHER): Payer: Federal, State, Local not specified - PPO | Admitting: Internal Medicine

## 2014-04-29 VITALS — BP 148/92 | HR 86 | Temp 97.8°F | Ht 68.0 in | Wt 252.2 lb

## 2014-04-29 DIAGNOSIS — Z0000 Encounter for general adult medical examination without abnormal findings: Secondary | ICD-10-CM

## 2014-04-29 DIAGNOSIS — I2699 Other pulmonary embolism without acute cor pulmonale: Secondary | ICD-10-CM

## 2014-04-29 DIAGNOSIS — M797 Fibromyalgia: Secondary | ICD-10-CM

## 2014-04-29 DIAGNOSIS — I10 Essential (primary) hypertension: Secondary | ICD-10-CM

## 2014-04-29 DIAGNOSIS — E119 Type 2 diabetes mellitus without complications: Secondary | ICD-10-CM

## 2014-04-29 DIAGNOSIS — Z23 Encounter for immunization: Secondary | ICD-10-CM

## 2014-04-29 DIAGNOSIS — R221 Localized swelling, mass and lump, neck: Secondary | ICD-10-CM

## 2014-04-29 DIAGNOSIS — R911 Solitary pulmonary nodule: Secondary | ICD-10-CM

## 2014-04-29 LAB — MICROALBUMIN / CREATININE URINE RATIO
Creatinine,U: 116.3 mg/dL
MICROALB UR: 0.8 mg/dL (ref 0.0–1.9)
Microalb Creat Ratio: 0.7 mg/g (ref 0.0–30.0)

## 2014-04-29 LAB — T4, FREE: Free T4: 0.86 ng/dL (ref 0.60–1.60)

## 2014-04-29 LAB — T3, FREE: T3, Free: 3.2 pg/mL (ref 2.3–4.2)

## 2014-04-29 LAB — HEMOGLOBIN A1C: Hgb A1c MFr Bld: 6.4 % (ref 4.6–6.5)

## 2014-04-29 LAB — TSH: TSH: 2.56 u[IU]/mL (ref 0.35–4.50)

## 2014-04-29 MED ORDER — ESCITALOPRAM OXALATE 10 MG PO TABS: 10.0000 mg | ORAL_TABLET | Freq: Every day | ORAL | Status: DC

## 2014-04-29 NOTE — Progress Notes (Signed)
Pre visit review using our clinic review tool, if applicable. No additional management support is needed unless otherwise documented below in the visit note. 

## 2014-04-29 NOTE — Assessment & Plan Note (Addendum)
Td 2014 Zostavax 2014 Pneumonia shot today Colonoscopy 2009, next 10 years Saw gynecology last year, was told to return in 5 years Last mammogram 2013, refer for mammogram  Other issues: Pulsatile area of the neck, will check a carotid ultrasound Headaches, where discontinue Cymbalta-oma see comments under fibromyalgia. Recommend to avoid Aleve and try Tylenol if needed Diet and exercise discussed

## 2014-04-29 NOTE — Assessment & Plan Note (Signed)
Saw hematology 03-2014, they will reassess the patient April 2016, so far they recommended 2 years or for anticoagulation then possibly continue with Xarelto low dose

## 2014-04-29 NOTE — Assessment & Plan Note (Addendum)
Started Cymbalta, reports that actually pain in the legs and even chest pain is better (not completely gone) Unfortunately has developed headaches since she started Cymbalta. Plan: Switch to Lexapro, return to the office in 2-3 months, if headache   not improving after discontinue Cymbalta patient to let me know

## 2014-04-29 NOTE — Assessment & Plan Note (Signed)
On diet control, check A1c 

## 2014-04-29 NOTE — Assessment & Plan Note (Signed)
BP today slightly elevated, ambulatory BPs normal Recent BMP normal. Recommend to continue diuretics and check ambulatory BPs

## 2014-04-29 NOTE — Patient Instructions (Signed)
Get your blood work before you leave   Start Lexapro 10 mg one tablet daily For the next 2 weeks take only one Cymbalta, then stop If your headaches are now gradually improving let me know  Check the  blood pressure 2 or 3 times a month  Be sure your blood pressure is between  145/85  and 110/65.  if it is consistently higher or lower, let me know     Please come back to the office in 2-3 for a routine check up

## 2014-04-29 NOTE — Assessment & Plan Note (Addendum)
Chart reviewed, CT 11-2013 Multiple tiny pulmonary nodules in the right lung are unchanged in size, number and distribution compared to the prior study can be considered benign requiring no further imaging followup.

## 2014-04-29 NOTE — Progress Notes (Signed)
Subjective:    Patient ID: Jennifer [name], female    DOB: January 18, 1951, 64 y.o.   MRN: [id number]  DOS:  04/29/2014 Type of visit - description : cpx Interval history:  Patient started Cymbalta, leg pain and chest pain somehow improved however has developed a headache, mild, persisting, decrease with Aleve, not associated with photo or phonophobia, associated w/ mild nausea? Not the worst HA of her life. Also saw oncology, note reviewed, see assessment and plan  ROS When asked about her concerns she reports occasional bright red blood per rectum, only with bowel movements, sometimes associated with pain. She's also concerned about a pulsatile area in the neck. See physical exam. Concerned about her thyroid. BP today slightly elevated, has checked BPs at home a few times and they are 120/80.  Past Medical History  Diagnosis Date  . Esophageal stenosis     Stricture after fundoplication REDO  . Tinnitus     Subjective  . Mitral valve prolapse   . GERD (gastroesophageal reflux disease)   . OSA (obstructive sleep apnea)     on CPAP-Mild - Sleep study 07/26/2002  . Pulmonary embolus     07/25/2008 and 12-2013  . Chronic chest pain   . Shortness of breath     with exertion  . Hypertension   . IBS (irritable bowel syndrome)   . Anemia   . Colon polyps     inflamed partially ulcerated hyperplastic polyp  . Diverticulosis   . Hemorrhoids   . Zoster 2012-07-25    R flank  . Diabetes 09/24/2013  . Dysphagia esophageal phase - chronic after 3 fundoplications 7/82/4235  . Anxiety     Past Surgical History  Procedure Laterality Date  . Brain surgery  July 25, 1993    Stem surgery, Arnold Chiari Malformation  . Breast reduction surgery    . Appendectomy    . Abdominal hysterectomy      still has 1 ovary   . Nissen fundoplication      X 3 lab, open x 2 last with mesh  . Cholecystectomy  07/26/2006  . Hernia repair      Hital Hernia  . Knee arthroscopy  05/16/2012    Procedure: ARTHROSCOPY KNEE;  Surgeon: Sharmon Revere, MD;  Location: [facility];  Service: Orthopedics;  Laterality: Left;  . Colonoscopy    . Esophagogastroduodenoscopy      History   Social History  . Marital Status: Married    Spouse Name: N/A    Number of Children: 1  . Years of Education: N/A   Occupational History  . [employer]   . TRAINING [employer]    Social History Main Topics  . Smoking status: Never Smoker   . Smokeless tobacco: Never Used     Comment: never used tobacco  . Alcohol Use: No  . Drug Use: No  . Sexual Activity: Yes    Birth Control/ Protection: None   Other Topics Concern  . Not on file   Social History Narrative   [employer] Graduate - Technical school   Married 07/25/2068 - 2 years divorced; married 07-25-1984 - 2.5 years divorced; married 07/25/1988 - 1 year divorced; married '00   1 son - 07/26/70   Sister - Died @ 31 [place]         Family History  Problem Relation Age of Onset  . Diabetes Mother   . Rheum arthritis Mother   . Hypertension Mother   . Hypertension Father   .  Rheum arthritis Sister   . Hypertension Sister   . Colon cancer Maternal Grandmother   . Rheum arthritis Sister   . Rheum arthritis Sister   . Rheum arthritis Sister   . Breast cancer Neg Hx   . CAD Sister     MI         Medication List       This list is accurate as of: 04/29/14  7:10 PM.  Always use your most recent med list.               cycloSPORINE 0.05 % ophthalmic emulsion  Commonly known as:  RESTASIS  Place 1 drop into both eyes daily.     escitalopram 10 MG tablet  Commonly known as:  LEXAPRO  Take 1 tablet (10 mg total) by mouth daily.     glucose blood test strip  Commonly known as:  ONE TOUCH ULTRA TEST  Check once daily     hyoscyamine 0.125 MG SL tablet  Commonly known as:  LEVSIN SL  Place 1 tablet (0.125 mg total) under the tongue every 4 (four) hours as needed.     RABEprazole 20 MG tablet  Commonly known as:  ACIPHEX  Take 20 mg by mouth 2 (two) times daily.     rivaroxaban 20 MG Tabs tablet  Commonly  known as:  XARELTO  Take 1 tablet (20 mg total) by mouth daily with supper.     temazepam 30 MG capsule  Commonly known as:  RESTORIL  Take 1 capsule (30 mg total) by mouth at bedtime as needed for sleep.     triamterene-hydrochlorothiazide 37.5-25 MG per tablet  Commonly known as:  MAXZIDE-25  Take 0.5 tablets by mouth daily.           Objective:   Physical Exam BP 148/92 mmHg  Pulse 86  Temp(Src) 97.8 F (36.6 C) (Oral)  Ht 5\' 8"  (1.727 m)  Wt 252 lb 4 oz (114.42 kg)  BMI 38.36 kg/m2  SpO2 97% General -- alert, well-developed, NAD.  Neck --no thyromegaly , on inspection she indeed has a noticeable pulse at the anterior right knee, upon palpation no mass. HEENT-- Not pale.  Neck is full range of motion Lungs -- normal respiratory effort, no intercostal retractions, no accessory muscle use, and normal breath sounds.  Heart-- normal rate, regular rhythm, no murmur.  Abdomen-- Not distended, good bowel sounds,soft, non-tender.  Extremities-- no pretibial edema bilaterally  Neurologic--  alert & oriented X3. Speech normal, gait appropriate for age, strength symmetric and appropriate for age.   EOMI, DTRs symmetric Psych-- Cognition and judgment appear intact. Cooperative with normal attention span and concentration. No anxious or depressed appearing.         Assessment & Plan:  In addition of her CPX  I spent more than  15  min with the patient assessing other issue, see a/ps , total F2F > 40 min

## 2014-05-04 ENCOUNTER — Ambulatory Visit (HOSPITAL_COMMUNITY): Payer: Federal, State, Local not specified - PPO | Attending: Cardiology | Admitting: *Deleted

## 2014-05-04 ENCOUNTER — Encounter (HOSPITAL_COMMUNITY): Payer: Federal, State, Local not specified - PPO

## 2014-05-04 DIAGNOSIS — I6523 Occlusion and stenosis of bilateral carotid arteries: Secondary | ICD-10-CM | POA: Diagnosis not present

## 2014-05-04 DIAGNOSIS — R221 Localized swelling, mass and lump, neck: Secondary | ICD-10-CM | POA: Diagnosis present

## 2014-05-04 DIAGNOSIS — E119 Type 2 diabetes mellitus without complications: Secondary | ICD-10-CM | POA: Insufficient documentation

## 2014-05-04 DIAGNOSIS — I10 Essential (primary) hypertension: Secondary | ICD-10-CM | POA: Insufficient documentation

## 2014-05-04 NOTE — Progress Notes (Signed)
Carotid Duplex Performed 

## 2014-05-11 ENCOUNTER — Telehealth: Payer: Self-pay

## 2014-05-11 MED ORDER — TEMAZEPAM 30 MG PO CAPS: 30.0000 mg | ORAL_CAPSULE | Freq: Every evening | ORAL | Status: DC | PRN

## 2014-05-11 NOTE — Telephone Encounter (Signed)
Ask  patient to come and provide a sample for a UDS, okay to refill # 60 and 1

## 2014-05-11 NOTE — Telephone Encounter (Signed)
Pt is requesting refill on Temazepam.  Last OV: 04/29/2014 Last Fill: 01/09/2014 # [id number] UDS: None  Please advise.

## 2014-05-11 NOTE — Telephone Encounter (Signed)
Faxed to [facility] Message sent to [facility] in lab regarding if Pt has had UDS or not.

## 2014-05-12 NOTE — Telephone Encounter (Signed)
PA for Restoril (temazepam) initiated and approved effective 03/13/14 through 05/12/2015. JG//CMA

## 2014-05-14 NOTE — Telephone Encounter (Signed)
Per Shanon Brow in lab Pt has not had UDS completed. LMOM informing Pt to return call.

## 2014-06-01 ENCOUNTER — Other Ambulatory Visit: Payer: Self-pay | Admitting: Physician Assistant

## 2014-06-01 ENCOUNTER — Other Ambulatory Visit: Payer: Self-pay | Admitting: Internal Medicine

## 2014-06-12 ENCOUNTER — Other Ambulatory Visit: Payer: Self-pay | Admitting: Physician Assistant

## 2014-06-15 NOTE — Telephone Encounter (Signed)
Rx request to pharmacy/[facility]  

## 2014-07-03 ENCOUNTER — Ambulatory Visit (INDEPENDENT_AMBULATORY_CARE_PROVIDER_SITE_OTHER): Payer: Federal, State, Local not specified - PPO | Admitting: Internal Medicine

## 2014-07-03 ENCOUNTER — Encounter: Payer: Self-pay | Admitting: Internal Medicine

## 2014-07-03 VITALS — BP 128/84 | HR 66 | Temp 98.0°F | Ht 68.0 in | Wt 248.5 lb

## 2014-07-03 DIAGNOSIS — M797 Fibromyalgia: Secondary | ICD-10-CM

## 2014-07-03 DIAGNOSIS — G8929 Other chronic pain: Secondary | ICD-10-CM

## 2014-07-03 DIAGNOSIS — R1010 Upper abdominal pain, unspecified: Secondary | ICD-10-CM

## 2014-07-03 DIAGNOSIS — R1011 Right upper quadrant pain: Principal | ICD-10-CM

## 2014-07-03 MED ORDER — LIDOCAINE 5 % EX PTCH: 1.0000 | MEDICATED_PATCH | Freq: Every day | CUTANEOUS | Status: DC | PRN

## 2014-07-03 NOTE — Progress Notes (Signed)
Pre visit review using our clinic review tool, if applicable. No additional management support is needed unless otherwise documented below in the visit note. 

## 2014-07-03 NOTE — Assessment & Plan Note (Addendum)
Cymbalta and was switch to Lexapro due to headache, not sure if she has been taking Lexapro, recommend to do so. Interestingly, states she also is having headaches after she takes xarelto  sometimes

## 2014-07-03 NOTE — Progress Notes (Signed)
Subjective:    Patient ID: Jennifer [name], female    DOB: 08-Feb-1951, 64 y.o.   MRN: [id number]  DOS:  07/03/2014 Type of visit - description : acute Interval history:  complains of flareup of chronic right flank pain for the last 3 weeks, states that the pain is extreme, in the past Lidoderm patch helped.   Cymbalta was recently changed to Lexapro due to headaches, not sure if she is taking Lexapro however today the patient reports "now I'm also expriencing headaches w/ xarelto".   Review of Systems   denies fver cills No nausea, voiting, diarrha. No gross hemauria. No rash Past Medical History  Diagnosis Date  . Esophageal stenosis     Stricture after fundoplication REDO  . Tinnitus     Subjective  . Mitral valve prolapse   . GERD (gastroesophageal reflux disease)   . OSA (obstructive sleep apnea)     on CPAP-Mild - Sleep study August 05, 2002  . Pulmonary embolus     Aug 04, 2008 and 12-2013  . Chronic chest pain   . Shortness of breath     with exertion  . Hypertension   . IBS (irritable bowel syndrome)   . Anemia   . Colon polyps     inflamed partially ulcerated hyperplastic polyp  . Diverticulosis   . Hemorrhoids   . Zoster 2012-08-04    R flank  . Diabetes 09/24/2013  . Dysphagia esophageal phase - chronic after 3 fundoplications 8/41/6606  . Anxiety     Past Surgical History  Procedure Laterality Date  . Brain surgery  August 04, 1993    Stem surgery, Arnold Chiari Malformation  . Breast reduction surgery    . Appendectomy    . Abdominal hysterectomy      still has 1 ovary   . Nissen fundoplication      X 3 lab, open x 2 last with mesh  . Cholecystectomy  05-Aug-2006  . Hernia repair      Hital Hernia  . Knee arthroscopy  05/16/2012    Procedure: ARTHROSCOPY KNEE;  Surgeon: Sharmon Revere, MD;  Location: [facility];  Service: Orthopedics;  Laterality: Left;  . Colonoscopy    . Esophagogastroduodenoscopy      History   Social History  . Marital Status: Married    Spouse Name: N/A  . Number of  Children: 1  . Years of Education: N/A   Occupational History  . [employer]   . 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 Smoking status: Never Smoker   . Smokeless tobacco: Never Used     Comment: never used tobacco  . Alcohol Use: No  . Drug Use: No  . Sexual Activity: Yes    Birth Control/ Protection: None   Other Topics Concern  . Not on file   Social History Narrative   [employer] Graduate - Technical school   Married 2068/08/04 - 2 years divorced; married August 04, 1984 - 2.5 years divorced; married 08-04-1988 - 1 year divorced; married '00   1 son - 05-Aug-2070   Sister - Died @ 63 [place]            Medication List       This list is accurate as of: 07/03/14 11:59 PM.  Always use your most recent med list.               cycloSPORINE 0.05 % ophthalmic emulsion  Commonly known as:  RESTASIS  Place 1 drop into both eyes  daily.     dicyclomine 20 MG tablet  Commonly known as:  BENTYL  TAKE ONE TABLET BY MOUTH THREE TIMES DAILY BEFORE MEAL(S)     escitalopram 10 MG tablet  Commonly known as:  LEXAPRO  Take 1 tablet (10 mg total) by mouth daily.     glucose blood test strip  Commonly known as:  ONE TOUCH ULTRA TEST  Check once daily     hyoscyamine 0.125 MG SL tablet  Commonly known as:  LEVSIN SL  Place 1 tablet (0.125 mg total) under the tongue every 4 (four) hours as needed.     lidocaine 5 %  Commonly known as:  LIDODERM  Place 1-2 patches onto the skin daily as needed. Remove & Discard patch within 12 hours or as directed by MD     RABEprazole 20 MG tablet  Commonly known as:  ACIPHEX  Take 20 mg by mouth 2 (two) times daily.     ACIPHEX 20 MG tablet  Generic drug:  RABEprazole  TAKE ONE TABLET BY MOUTH TWICE DAILY     rivaroxaban 20 MG Tabs tablet  Commonly known as:  XARELTO  Take 1 tablet (20 mg total) by mouth daily with supper.     temazepam 30 MG capsule  Commonly known as:  RESTORIL  Take 1 capsule (30 mg total) by mouth at bedtime as needed for sleep.      triamterene-hydrochlorothiazide 37.5-25 MG per tablet  Commonly known as:  MAXZIDE-25  Take 0.5 tablets by mouth daily.           Objective:   Physical Exam BP 128/84 mmHg  Pulse 66  Temp(Src) 98 F (36.7 C) (Oral)  Ht 5\' 8"  (1.727 m)  Wt 248 lb 8 oz (112.719 kg)  BMI 37.79 kg/m2  SpO2 95% General:   Well developed, well nourished . NAD.  HEENT:  Normocephalic . Face symmetric, atraumatic Muscle skeletal: no pretibial edema bilaterally  back-- slightly TTP  At the flank, no rash. Skin: Not pale. Not jaundice Neurologic:  alert & oriented X3.  Speech normal, gait appropriate for age and unassisted Psych--  Cognition and judgment appear intact.  Cooperative with normal attention span and concentration.  Behavior appropriate. No anxious or depressed appearing.       Assessment & Plan:

## 2014-07-03 NOTE — Patient Instructions (Signed)
Please go to the lab for a UDS and a contract  Use 1 or 2 patches a day as needed for pain  Go back on Lexapro for your fibromyalgia.

## 2014-07-03 NOTE — Assessment & Plan Note (Signed)
Previously described pain has flareup for 3 weeks, I offered her a referral to see Dr. Nelva Bush for pain management, she may benefit for MRI of the thoracic spine (radiculopathy?)  and a local injection. Patient prefers to do a trial with Lidoderm patches which help in the past. Prescription provided.

## 2014-07-07 ENCOUNTER — Telehealth: Payer: Self-pay | Admitting: Internal Medicine

## 2014-07-07 DIAGNOSIS — Z8619 Personal history of other infectious and parasitic diseases: Secondary | ICD-10-CM

## 2014-07-07 NOTE — Telephone Encounter (Signed)
Please arrange a referral to see pain management, Dr. Nelva Bush. Send my last 3 office visit notes

## 2014-07-07 NOTE — Telephone Encounter (Signed)
Caller name:Shibley Espyn Relation to KG:URKY Call back number:[phone] Pharmacy:[facility]  Reason for call: pt was seen on 3/11 states the pain is worse and dr. Larose Kells had informed her to call in a couple of days if she was not better and would send her to a pain medicine doctor to get the injection, pt states she is ready because the pain patch is not working to well. Would like for you to call her please.

## 2014-07-07 NOTE — Telephone Encounter (Signed)
Referral placed.

## 2014-07-07 NOTE — Telephone Encounter (Signed)
Please advise 

## 2014-07-13 ENCOUNTER — Telehealth: Payer: Self-pay | Admitting: Internal Medicine

## 2014-07-13 DIAGNOSIS — G8929 Other chronic pain: Secondary | ICD-10-CM

## 2014-07-13 DIAGNOSIS — R1011 Right upper quadrant pain: Principal | ICD-10-CM

## 2014-07-13 NOTE — Telephone Encounter (Signed)
Advise patient, MRI order entered

## 2014-07-13 NOTE — Telephone Encounter (Signed)
She would like to proceed with the MRI  She is in pain and cannot wait for the pain clinic to admit her

## 2014-07-13 NOTE — Telephone Encounter (Signed)
Pt informed

## 2014-07-13 NOTE — Telephone Encounter (Signed)
Please advise 

## 2014-07-15 ENCOUNTER — Telehealth: Payer: Self-pay | Admitting: Internal Medicine

## 2014-07-15 NOTE — Telephone Encounter (Signed)
In [facility] work que/awaiting appt

## 2014-07-15 NOTE — Telephone Encounter (Signed)
[facility] informed Pt that they no longer had pain clinic? Pt called stating this is where she was referred and is requesting refill elsewhere. Referral was placed 07/07/2014.

## 2014-07-15 NOTE — Telephone Encounter (Signed)
Caller name: Imogine Relation to pt: self Call back number:  [phone] Pharmacy:  Reason for call:   Patient states that [facility] does not have a pain clinic anymore(she said that is who we referred her to) and would like to be referred elsewhere. Patient would like to talk to [facility] about this.

## 2014-07-16 ENCOUNTER — Telehealth: Payer: Self-pay

## 2014-07-16 NOTE — Telephone Encounter (Signed)
UDS: 07/03/2014  Positive for Restoril Positive for Citalopram  Low risk per Dr. Larose Kells 07/16/2014

## 2014-07-18 ENCOUNTER — Ambulatory Visit (HOSPITAL_BASED_OUTPATIENT_CLINIC_OR_DEPARTMENT_OTHER)
Admission: RE | Admit: 2014-07-18 | Discharge: 2014-07-18 | Disposition: A | Discharge status: Home / Self Care | Payer: Federal, State, Local not specified - PPO | Source: Ambulatory Visit | Attending: Internal Medicine | Admitting: Internal Medicine

## 2014-07-18 DIAGNOSIS — M4804 Spinal stenosis, thoracic region: Secondary | ICD-10-CM | POA: Diagnosis not present

## 2014-07-18 DIAGNOSIS — M5134 Other intervertebral disc degeneration, thoracic region: Secondary | ICD-10-CM | POA: Insufficient documentation

## 2014-07-18 DIAGNOSIS — G8929 Other chronic pain: Secondary | ICD-10-CM

## 2014-07-18 DIAGNOSIS — R1011 Right upper quadrant pain: Secondary | ICD-10-CM

## 2014-07-27 ENCOUNTER — Other Ambulatory Visit: Payer: Self-pay

## 2014-07-27 MED ORDER — TRIAMTERENE-HCTZ 37.5-25 MG PO TABS: 0.5000 | ORAL_TABLET | Freq: Every day | ORAL | Status: DC

## 2014-07-28 ENCOUNTER — Other Ambulatory Visit: Payer: Self-pay | Admitting: Internal Medicine

## 2014-07-28 ENCOUNTER — Ambulatory Visit: Payer: Federal, State, Local not specified - PPO | Admitting: Internal Medicine

## 2014-08-03 ENCOUNTER — Encounter (HOSPITAL_BASED_OUTPATIENT_CLINIC_OR_DEPARTMENT_OTHER): Payer: Self-pay

## 2014-08-03 ENCOUNTER — Other Ambulatory Visit: Payer: Federal, State, Local not specified - PPO | Admitting: Lab

## 2014-08-03 ENCOUNTER — Other Ambulatory Visit (HOSPITAL_BASED_OUTPATIENT_CLINIC_OR_DEPARTMENT_OTHER): Payer: Federal, State, Local not specified - PPO

## 2014-08-03 ENCOUNTER — Other Ambulatory Visit: Payer: Self-pay

## 2014-08-03 ENCOUNTER — Encounter: Payer: Self-pay | Admitting: Family

## 2014-08-03 ENCOUNTER — Ambulatory Visit (HOSPITAL_BASED_OUTPATIENT_CLINIC_OR_DEPARTMENT_OTHER): Payer: Federal, State, Local not specified - PPO | Admitting: Family

## 2014-08-03 ENCOUNTER — Ambulatory Visit (HOSPITAL_BASED_OUTPATIENT_CLINIC_OR_DEPARTMENT_OTHER)
Admission: RE | Admit: 2014-08-03 | Discharge: 2014-08-03 | Disposition: A | Discharge status: Home / Self Care | Payer: Federal, State, Local not specified - PPO | Source: Ambulatory Visit | Attending: Hematology & Oncology | Admitting: Hematology & Oncology

## 2014-08-03 VITALS — BP 149/77 | HR 68 | Temp 97.8°F | Resp 14 | Ht 69.0 in | Wt 245.0 lb

## 2014-08-03 DIAGNOSIS — R079 Chest pain, unspecified: Secondary | ICD-10-CM | POA: Diagnosis not present

## 2014-08-03 DIAGNOSIS — I2699 Other pulmonary embolism without acute cor pulmonale: Secondary | ICD-10-CM

## 2014-08-03 LAB — CMP (CANCER CENTER ONLY)
ALBUMIN: 3.7 g/dL (ref 3.3–5.5)
ALT(SGPT): 19 U/L (ref 10–47)
AST: 22 U/L (ref 11–38)
Alkaline Phosphatase: 63 U/L (ref 26–84)
BILIRUBIN TOTAL: 0.6 mg/dL (ref 0.20–1.60)
BUN, Bld: 15 mg/dL (ref 7–22)
CO2: 27 mEq/L (ref 18–33)
Calcium: 8.5 mg/dL (ref 8.0–10.3)
Chloride: 108 mEq/L (ref 98–108)
Creat: 0.9 mg/dl (ref 0.6–1.2)
GLUCOSE: 101 mg/dL (ref 73–118)
POTASSIUM: 3.4 meq/L (ref 3.3–4.7)
Sodium: 142 mEq/L (ref 128–145)
Total Protein: 7.4 g/dL (ref 6.4–8.1)

## 2014-08-03 LAB — CBC WITH DIFFERENTIAL (CANCER CENTER ONLY)
BASO#: 0 10*3/uL (ref 0.0–0.2)
BASO%: 0.2 % (ref 0.0–2.0)
EOS ABS: 0 10*3/uL (ref 0.0–0.5)
EOS%: 0.2 % (ref 0.0–7.0)
HCT: 37.4 % (ref 34.8–46.6)
HEMOGLOBIN: 12.5 g/dL (ref 11.6–15.9)
LYMPH#: 2.1 10*3/uL (ref 0.9–3.3)
LYMPH%: 33.2 % (ref 14.0–48.0)
MCH: 28.2 pg (ref 26.0–34.0)
MCHC: 33.4 g/dL (ref 32.0–36.0)
MCV: 84 fL (ref 81–101)
MONO#: 0.6 10*3/uL (ref 0.1–0.9)
MONO%: 9.2 % (ref 0.0–13.0)
NEUT#: 3.6 10*3/uL (ref 1.5–6.5)
NEUT%: 57.2 % (ref 39.6–80.0)
PLATELETS: 246 10*3/uL (ref 145–400)
RBC: 4.43 10*6/uL (ref 3.70–5.32)
RDW: 15.3 % (ref 11.1–15.7)
WBC: 6.2 10*3/uL (ref 3.9–10.0)

## 2014-08-03 MED ORDER — IOHEXOL 300 MG/ML  SOLN
100.0000 mL | Freq: Once | INTRAMUSCULAR | Status: AC | PRN
  Administered 2014-08-03: 100 mL via INTRAVENOUS

## 2014-08-03 NOTE — Progress Notes (Signed)
Hematology and Oncology Follow Up Visit  Jennifer [name] [id number] 1950-09-09 64 y.o. 08/03/2014   Principle Diagnosis:  Recurrent pulmonary embolism  Current Therapy:   Xarelto 20 mg by mouth daily    Interim History:  Jennifer [name] is here today for follow-up. She had a CT earlier today that showed resolution of bilateral PEs. We will continue her on Xarelto for now.  She has had no bruising or episodes of bleeding.  She did have a near syncopal episode where she became SOB and diaphoretic. She feels that this was because her blood sugar dropped. She states that she is a new diabetic and is now carrying snacks in her purse.  No infections. She denies fatigue, fever, chills, n/v, cough, rash, dizziness, headaches, blurred vision, SOB, chest pain, palpitations, abdominal pain, constipation, diarrhea, blood in urine or stool.  She has some swelling in her right knee at times. She has a bone spur in the area that is tender. She has no other swelling or tenderness and no numbness or tingling in her extremities. She has a good appetite and is staying hydrated. Her weight is stable.    Medications:    Medication List       This list is accurate as of: 08/03/14  2:03 PM.  Always use your most recent med list.               ACIPHEX 20 MG tablet  Generic drug:  RABEprazole  TAKE ONE TABLET BY MOUTH TWICE DAILY     cycloSPORINE 0.05 % ophthalmic emulsion  Commonly known as:  RESTASIS  Place 1 drop into both eyes daily.     dicyclomine 20 MG tablet  Commonly known as:  BENTYL  TAKE ONE TABLET BY MOUTH THREE TIMES DAILY BEFORE MEAL(S)     escitalopram 10 MG tablet  Commonly known as:  LEXAPRO  Take 1 tablet (10 mg total) by mouth daily.     glucose blood test strip  Commonly known as:  ONE TOUCH ULTRA TEST  Check once daily     hyoscyamine 0.125 MG SL tablet  Commonly known as:  LEVSIN SL  Place 1 tablet (0.125 mg total) under the tongue every 4 (four) hours as needed.     lidocaine 5 %  Commonly known as:  LIDODERM  Place 1-2 patches onto the skin daily as needed. Remove & Discard patch within 12 hours or as directed by MD     rivaroxaban 20 MG Tabs tablet  Commonly known as:  XARELTO  Take 1 tablet (20 mg total) by mouth daily with supper.     temazepam 30 MG capsule  Commonly known as:  RESTORIL  Take 1 capsule (30 mg total) by mouth at bedtime as needed for sleep.     triamterene-hydrochlorothiazide 37.5-25 MG per tablet  Commonly known as:  MAXZIDE-25  Take 0.5 tablets by mouth daily.        Allergies:  Allergies  Allergen Reactions  . Morphine Anaphylaxis  . Morphine And Related Shortness Of Breath  . Promethazine Hcl Other (See Comments)    Hallucinations  . Sulfonamide Derivatives Hives    Past Medical History, Surgical history, Social history, and Family History were reviewed and updated.  Review of Systems: All other 10 point review of systems is negative.   Physical Exam:  height is 5\' 9"  (1.753 m) and weight is 245 lb (111.131 kg). Her oral temperature is 97.8 F (36.6 C). Her blood pressure is 149/77 and  her pulse is 68. Her respiration is 14.   Wt Readings from Last 3 Encounters:  08/03/14 245 lb (111.131 kg)  07/03/14 248 lb 8 oz (112.719 kg)  04/29/14 252 lb 4 oz (114.42 kg)    Ocular: Sclerae unicteric, pupils equal, round and reactive to light Ear-nose-throat: Oropharynx clear, dentition fair Lymphatic: No cervical or supraclavicular adenopathy Lungs no rales or rhonchi, good excursion bilaterally Heart regular rate and rhythm, no murmur appreciated Abd soft, nontender, positive bowel sounds MSK no focal spinal tenderness, no joint edema Neuro: non-focal, well-oriented, appropriate affect Breasts: Deferred  Lab Results  Component Value Date   WBC 6.2 08/03/2014   HGB 12.5 08/03/2014   HCT 37.4 08/03/2014   MCV 84 08/03/2014   PLT 246 08/03/2014   Lab Results  Component Value Date   IRON 55 10/14/2007    Lab Results  Component Value Date   RBC 4.43 08/03/2014   No results found for: KPAFRELGTCHN, LAMBDASER, KAPLAMBRATIO No results found for: IGGSERUM, IGA, IGMSERUM No results found for: [name], SPEI   Chemistry      Component Value Date/Time   NA 142 08/03/2014 1203   NA 143 01/08/2014 1055   K 3.4 08/03/2014 1203   K 3.9 01/08/2014 1055   CL 108 08/03/2014 1203   CL 101 01/08/2014 1055   CO2 27 08/03/2014 1203   CO2 30 01/08/2014 1055   BUN 15 08/03/2014 1203   BUN 13 01/08/2014 1055   CREATININE 0.9 08/03/2014 1203   CREATININE 0.90 01/08/2014 1055      Component Value Date/Time   CALCIUM 8.5 08/03/2014 1203   CALCIUM 9.6 01/08/2014 1055   ALKPHOS 63 08/03/2014 1203   ALKPHOS 77 01/08/2014 1055   AST 22 08/03/2014 1203   AST 20 01/08/2014 1055   ALT 19 08/03/2014 1203   ALT 15 01/08/2014 1055   BILITOT 0.60 08/03/2014 1203   BILITOT 0.2* 01/08/2014 1055     Impression and Plan: Jennifer [name] is 64 year old African-American female with a history of recurrent pulmonary emboli. She is idiopathic from her hypercoagulable studies. She is asymptomatic at this time and doing well.  Her CT angio today showed resolution of bilateral pulmonary emboli.  She will continue on Xarelto 20 mg daily.  Per [name]: We will 2 years of full dose anticoagulation. Then possibly go to low-dose anticoagulation with 5 mg of Xarelto daily. We will see her back in 5 months for labs and follow-up.  She knows to call here with any questions or concerns. We can certainly see her sooner if need be.   Eliezer Bottom, NP 4/11/20162:03 PM

## 2014-08-04 ENCOUNTER — Encounter: Payer: Self-pay | Admitting: Internal Medicine

## 2014-08-04 ENCOUNTER — Telehealth: Payer: Self-pay | Admitting: *Deleted

## 2014-08-04 ENCOUNTER — Ambulatory Visit (INDEPENDENT_AMBULATORY_CARE_PROVIDER_SITE_OTHER): Payer: Federal, State, Local not specified - PPO | Admitting: Internal Medicine

## 2014-08-04 VITALS — BP 124/62 | HR 69 | Temp 98.1°F | Ht 68.0 in | Wt 246.5 lb

## 2014-08-04 DIAGNOSIS — F419 Anxiety disorder, unspecified: Secondary | ICD-10-CM

## 2014-08-04 DIAGNOSIS — R1011 Right upper quadrant pain: Secondary | ICD-10-CM

## 2014-08-04 DIAGNOSIS — M797 Fibromyalgia: Secondary | ICD-10-CM

## 2014-08-04 DIAGNOSIS — E119 Type 2 diabetes mellitus without complications: Secondary | ICD-10-CM

## 2014-08-04 DIAGNOSIS — G8929 Other chronic pain: Secondary | ICD-10-CM

## 2014-08-04 DIAGNOSIS — R1010 Upper abdominal pain, unspecified: Secondary | ICD-10-CM | POA: Diagnosis not present

## 2014-08-04 DIAGNOSIS — Z1239 Encounter for other screening for malignant neoplasm of breast: Secondary | ICD-10-CM

## 2014-08-04 LAB — D-DIMER, QUANTITATIVE: D-Dimer, Quant: 0.3 ug{FEU}/mL (ref 0.00–0.48)

## 2014-08-04 MED ORDER — LEXAPRO 10 MG PO TABS: 10.0000 mg | ORAL_TABLET | Freq: Every day | ORAL | Status: DC

## 2014-08-04 MED ORDER — ALPRAZOLAM 0.5 MG PO TABS: 0.5000 mg | ORAL_TABLET | Freq: Every day | ORAL | Status: DC | PRN

## 2014-08-04 NOTE — Patient Instructions (Signed)
Go to the lab today  Take Xanax only as needed for flying  If you have any more  palpitations please call the office, if symptoms severe persisting go to the ER  Next visit in 3 months,  please make an appointment

## 2014-08-04 NOTE — Progress Notes (Signed)
Subjective:    Patient ID: Jennifer [name], female    DOB: 14-Aug-1950, 64 y.o.   MRN: [id number]  DOS:  08/04/2014 Type of visit - description : rov Interval history:  Patient is a 64 year old female with a history of fibromyalgia, hypertension, diabetes, and pulmonary emboli in today for a follow up. She completed an MRI of her thoracic spine on 07/18/14 regarding flank pain on her right side. She states she has a follow up appointment with Dr. Nelva Bush on 4/14 regarding the results.  She states that her fibromyalgia has been well-controlled on generic escitalopram and would like to try the branded Lexapro.   She does mention a  episode one month ago of palpitations accompanied by diaphoresis which lasted 10-15 minutes and resolved upon rest and eating food. Patient attributes it to a hypoglycemic episode and denied any chest pain, shortness of breath, or edema during the event. She also denied any headaches or dizziness at that time. When asked she mentioned her blood sugar typically runs a little above 100.   She mentions that she gets claustrophobic during flights and would like a prescription for an anti-anxiety medication.    Review of Systems  Constitutional: No fever, chills. No unexplained wt changes.  HEENT: No dental problems, ear discharge, facial swelling, voice changes. No eye discharge, redness or intolerance to light Respiratory: No wheezing or difficulty breathing. No cough , mucus production Cardiovascular: No CP, leg swelling GI: dysphagia and odynophagia, nausea occasionally (chronic issue) No vomiting, diarrhea or abdominal pain.  No blood in the stools. GU: No dysuria, gross hematuria,  Musculoskeletal: No joint swellings or unusual aches or pains Neurological: No dizziness or syncope. No headaches. No diplopia Psychiatry: Anxiety while flying   Past Medical History  Diagnosis Date  . Esophageal stenosis     Stricture after fundoplication REDO  . Tinnitus    Subjective  . Mitral valve prolapse   . GERD (gastroesophageal reflux disease)   . OSA (obstructive sleep apnea)     on CPAP-Mild - Sleep study 2004  . Pulmonary embolus     2010 and 12-2013  . Chronic chest pain   . Shortness of breath     with exertion  . Hypertension   . IBS (irritable bowel syndrome)   . Anemia   . Colon polyps     inflamed partially ulcerated hyperplastic polyp  . Diverticulosis   . Hemorrhoids   . Zoster 2014    R flank  . Diabetes 09/24/2013  . Dysphagia esophageal phase - chronic after 3 fundoplications 8/45/3646  . Anxiety     Past Surgical History  Procedure Laterality Date  . Brain surgery  1995    Stem surgery, Arnold Chiari Malformation  . Breast reduction surgery    . Appendectomy    . Abdominal hysterectomy      still has 1 ovary   . Nissen fundoplication      X 3 lab, open x 2 last with mesh  . Cholecystectomy  2008  . Hernia repair      Hital Hernia  . Knee arthroscopy  05/16/2012    Procedure: ARTHROSCOPY KNEE;  Surgeon: Sharmon Revere, MD;  Location: [facility];  Service: Orthopedics;  Laterality: Left;  . Colonoscopy    . Esophagogastroduodenoscopy      History   Social History  . Marital Status: Married    Spouse Name: N/A  . Number of Children: 1  . Years of Education: N/A  Occupational History  . [employer]   . TRAINING [employer]    Social History Main Topics  . Smoking status: Never Smoker   . Smokeless tobacco: Never Used     Comment: never used tobacco  . Alcohol Use: No  . Drug Use: No  . Sexual Activity: Yes    Birth Control/ Protection: None   Other Topics Concern  . Not on file   Social History Narrative   [employer] Graduate - Technical school   Married 08-15-68 - 2 years divorced; married 08-15-84 - 2.5 years divorced; married 08-15-1988 - 1 year divorced; married '00   1 son - 08/16/70   Sister - Died @ 50 [place]            Medication List       This list is accurate as of: 08/04/14  9:08 PM.  Always use your most recent med list.                ACIPHEX 20 MG tablet  Generic drug:  RABEprazole  TAKE ONE TABLET BY MOUTH TWICE DAILY     ALPRAZolam 0.5 MG tablet  Commonly known as:  XANAX  Take 1 tablet (0.5 mg total) by mouth daily as needed for anxiety.     cycloSPORINE 0.05 % ophthalmic emulsion  Commonly known as:  RESTASIS  Place 1 drop into both eyes daily.     dicyclomine 20 MG tablet  Commonly known as:  BENTYL  TAKE ONE TABLET BY MOUTH THREE TIMES DAILY BEFORE MEAL(S)     glucose blood test strip  Commonly known as:  ONE TOUCH ULTRA TEST  Check once daily     hyoscyamine 0.125 MG SL tablet  Commonly known as:  LEVSIN SL  Place 1 tablet (0.125 mg total) under the tongue every 4 (four) hours as needed.     LEXAPRO 10 MG tablet  Generic drug:  escitalopram  Take 1 tablet (10 mg total) by mouth daily.     lidocaine 5 %  Commonly known as:  LIDODERM  Place 1-2 patches onto the skin daily as needed. Remove & Discard patch within 12 hours or as directed by MD     rivaroxaban 20 MG Tabs tablet  Commonly known as:  XARELTO  Take 1 tablet (20 mg total) by mouth daily with supper.     temazepam 30 MG capsule  Commonly known as:  RESTORIL  Take 1 capsule (30 mg total) by mouth at bedtime as needed for sleep.     triamterene-hydrochlorothiazide 37.5-25 MG per tablet  Commonly known as:  MAXZIDE-25  Take 0.5 tablets by mouth daily.           Objective:   Physical Exam BP 124/62 mmHg  Pulse 69  Temp(Src) 98.1 F (36.7 C) (Oral)  Ht 5\' 8"  (1.727 m)  Wt 246 lb 8 oz (111.812 kg)  BMI 37.49 kg/m2  SpO2 97%  General:   Well developed, well nourished . NAD.  HEENT:  Normocephalic . Face symmetric, atraumatic Lungs:  CTA B Normal respiratory effort, no intercostal retractions, no accessory muscle use. Heart: RRR,  no murmur.  Muscle skeletal: no pretibial edema bilaterally  Skin: Not pale. Not jaundice Neurologic:  alert & oriented X3.  Speech normal, gait appropriate for age and  unassisted Psych--  Cognition and judgment appear intact.  Cooperative with normal attention span and concentration.  Behavior appropriate. slt  anxious , no depressed appearing.     Assessment & Plan:  Diabetes: Patient is checking blood sugar daily and notes it falls around 110 typically. Last A1c was 6.4 in January.  Plan: Recheck A1c today and follow up in three months or sooner if needed.  HTN: Well controlled, no changes to meds. Encouraged diet and exercise.   Palpitations , I'm somewhat concerned about her palpitations however she did not have chest pain, recent CT show that the previous pulmonary emboli are resolved.  Recommend close observation, will let me know if symptoms resurface  Patient instructed about need for mammogram and patient agreed to set up appointment today.

## 2014-08-04 NOTE — Progress Notes (Signed)
Pre visit review using our clinic review tool, if applicable. No additional management support is needed unless otherwise documented below in the visit note. 

## 2014-08-04 NOTE — Telephone Encounter (Addendum)
Patient aware of results.   ----- Message from [name], MD sent at 08/04/2014  7:35 AM EDT ----- Call - blood clots are gone!!  pete

## 2014-08-04 NOTE — Assessment & Plan Note (Signed)
  Anxiety: Patient states claustrophobia while flying, requests anti-anxiety medication. Plan: Xanax prescribed prn, follow up in three months or sooner if needed.

## 2014-08-04 NOTE — Assessment & Plan Note (Signed)
Rt Flank Pain Patient had MRI done on 07/18/14, results showed some arthritis of the thoracic vertebrae. Patient has plans to see Dr. Nelva Bush on 4/14 regarding results and to establish plan of care.

## 2014-08-04 NOTE — Assessment & Plan Note (Signed)
Fibromyalgia: Patient states this is controlled (still has some pain) with generic escitalopram but would like to try the brand Lexapro to see if there is even more improvement. Plan: Lexapro prescribed, patient instructed to come back in three months or sooner if needed.

## 2014-08-05 ENCOUNTER — Other Ambulatory Visit: Payer: Self-pay | Admitting: Internal Medicine

## 2014-08-05 LAB — HEMOGLOBIN A1C: Hgb A1c MFr Bld: 6.2 % (ref 4.6–6.5)

## 2014-08-06 ENCOUNTER — Ambulatory Visit
Admission: RE | Admit: 2014-08-06 | Discharge: 2014-08-06 | Disposition: A | Discharge status: Home / Self Care | Payer: Federal, State, Local not specified - PPO | Source: Ambulatory Visit | Attending: Internal Medicine | Admitting: Internal Medicine

## 2014-08-06 ENCOUNTER — Ambulatory Visit: Payer: Federal, State, Local not specified - PPO

## 2014-08-06 DIAGNOSIS — Z1239 Encounter for other screening for malignant neoplasm of breast: Secondary | ICD-10-CM

## 2014-08-06 LAB — HM MAMMOGRAPHY

## 2014-08-11 ENCOUNTER — Ambulatory Visit
Admission: RE | Admit: 2014-08-11 | Discharge: 2014-08-11 | Disposition: A | Discharge status: Home / Self Care | Payer: Federal, State, Local not specified - PPO | Source: Ambulatory Visit | Attending: Orthopedic Surgery | Admitting: Orthopedic Surgery

## 2014-08-11 ENCOUNTER — Other Ambulatory Visit: Payer: Self-pay | Admitting: Internal Medicine

## 2014-08-11 ENCOUNTER — Other Ambulatory Visit: Payer: Self-pay | Admitting: Orthopedic Surgery

## 2014-08-11 DIAGNOSIS — M2391 Unspecified internal derangement of right knee: Secondary | ICD-10-CM

## 2014-08-11 NOTE — Telephone Encounter (Signed)
Ok 30 and 2 RF 

## 2014-08-11 NOTE — Telephone Encounter (Signed)
Pt is requesting refill on Temazepam.  Last OV: 08/04/2014 Last Fill: 05/11/2014 #30 2RF UDS: 07/03/2014 Low risk  Please advise.

## 2014-08-11 NOTE — Telephone Encounter (Signed)
Rx printed, awaiting MD signature.  

## 2014-08-11 NOTE — Telephone Encounter (Signed)
Rx faxed to [facility].

## 2014-09-04 ENCOUNTER — Other Ambulatory Visit: Payer: Self-pay | Admitting: Internal Medicine

## 2014-10-07 ENCOUNTER — Ambulatory Visit (INDEPENDENT_AMBULATORY_CARE_PROVIDER_SITE_OTHER): Payer: Federal, State, Local not specified - PPO | Admitting: Internal Medicine

## 2014-10-07 ENCOUNTER — Encounter: Payer: Self-pay | Admitting: Internal Medicine

## 2014-10-07 VITALS — BP 122/68 | HR 88 | Temp 98.5°F | Ht 68.0 in | Wt 254.1 lb

## 2014-10-07 DIAGNOSIS — J209 Acute bronchitis, unspecified: Secondary | ICD-10-CM

## 2014-10-07 MED ORDER — AZITHROMYCIN 250 MG PO TABS: ORAL_TABLET | ORAL | Status: DC

## 2014-10-07 MED ORDER — BENZONATATE 200 MG PO CAPS: 200.0000 mg | ORAL_CAPSULE | Freq: Three times a day (TID) | ORAL | Status: DC | PRN

## 2014-10-07 MED ORDER — ALBUTEROL SULFATE HFA 108 (90 BASE) MCG/ACT IN AERS: 2.0000 | INHALATION_SPRAY | Freq: Four times a day (QID) | RESPIRATORY_TRACT | Status: DC | PRN

## 2014-10-07 MED ORDER — PREDNISONE 10 MG PO TABS: 10.0000 mg | ORAL_TABLET | Freq: Every day | ORAL | Status: DC

## 2014-10-07 NOTE — Progress Notes (Signed)
Subjective:    Patient ID: Jennifer [name], female    DOB: 1950-11-13, 64 y.o.   MRN: [id number]  DOS:  10/07/2014 Type of visit - description : acute Interval history: Symptoms started approximately 4 days ago, the day prior for her flying back from [place]. +Cough, no mucus production, with the onset of symptoms had a temperature of 101. + Chest and abdominal pain with cough. No other fam members affected. Patient is on  Xarelto, good compliance.    Review of Systems  + Ear aches, mild frontal headaches. No nausea, vomiting, diarrhea + Wheezing. Denies any leg pain or calf swelling. Aches from fibromyalgia seems slightly worse lately.  Past Medical History  Diagnosis Date  . Esophageal stenosis     Stricture after fundoplication REDO  . Tinnitus     Subjective  . Mitral valve prolapse   . GERD (gastroesophageal reflux disease)   . OSA (obstructive sleep apnea)     on CPAP-Mild - Sleep study 2002-08-20  . Pulmonary embolus     08-19-08 and 12-2013  . Chronic chest pain   . Shortness of breath     with exertion  . Hypertension   . IBS (irritable bowel syndrome)   . Anemia   . Colon polyps     inflamed partially ulcerated hyperplastic polyp  . Diverticulosis   . Hemorrhoids   . Zoster 2012-08-19    R flank  . Diabetes 09/24/2013  . Dysphagia esophageal phase - chronic after 3 fundoplications 05/16/4823  . Anxiety     Past Surgical History  Procedure Laterality Date  . Brain surgery  08/19/93    Stem surgery, Arnold Chiari Malformation  . Breast reduction surgery    . Appendectomy    . Abdominal hysterectomy      still has 1 ovary   . Nissen fundoplication      X 3 lab, open x 2 last with mesh  . Cholecystectomy  August 20, 2006  . Hernia repair      Hital Hernia  . Knee arthroscopy  05/16/2012    Procedure: ARTHROSCOPY KNEE;  Surgeon: Sharmon Revere, MD;  Location: [facility];  Service: Orthopedics;  Laterality: Left;  . Colonoscopy    . Esophagogastroduodenoscopy      History   Social  History  . Marital Status: Married    Spouse Name: N/A  . Number of Children: 1  . Years of Education: N/A   Occupational History  . [employer]   . 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 Smoking status: Never Smoker   . Smokeless tobacco: Never Used     Comment: never used tobacco  . Alcohol Use: No  . Drug Use: No  . Sexual Activity: Yes    Birth Control/ Protection: None   Other Topics Concern  . Not on file   Social History Narrative   [employer] Graduate - Technical school   Married August 19, 2068 - 2 years divorced; married 08-19-1984 - 2.5 years divorced; married 08/19/88 - 1 year divorced; married '00   1 son - August 20, 2070   Sister - Died @ 20 [place]            Medication List       This list is accurate as of: 10/07/14  6:28 PM.  Always use your most recent med list.               ACIPHEX 20 MG tablet  Generic drug:  RABEprazole  TAKE ONE TABLET BY MOUTH TWICE DAILY     albuterol 108 (90 BASE) MCG/ACT inhaler  Commonly known as:  VENTOLIN HFA  Inhale 2 puffs into the lungs every 6 (six) hours as needed for wheezing or shortness of breath.     ALPRAZolam 0.5 MG tablet  Commonly known as:  XANAX  Take 1 tablet (0.5 mg total) by mouth daily as needed for anxiety.     azithromycin 250 MG tablet  Commonly known as:  ZITHROMAX Z-PAK  2 tabs a day the first day, then 1 tab a day x 4 days     benzonatate 200 MG capsule  Commonly known as:  TESSALON  Take 1 capsule (200 mg total) by mouth 3 (three) times daily as needed for cough.     cycloSPORINE 0.05 % ophthalmic emulsion  Commonly known as:  RESTASIS  Place 1 drop into both eyes daily.     glucose blood test strip  Commonly known as:  ONE TOUCH ULTRA TEST  Check once daily     hyoscyamine 0.125 MG SL tablet  Commonly known as:  LEVSIN SL  Place 1 tablet (0.125 mg total) under the tongue every 4 (four) hours as needed.     LEXAPRO 10 MG tablet  Generic drug:  escitalopram  Take 1 tablet (10 mg total) by mouth daily.      lidocaine 5 %  Commonly known as:  LIDODERM  Place 1-2 patches onto the skin daily as needed. Remove & Discard patch within 12 hours or as directed by MD     predniSONE 10 MG tablet  Commonly known as:  DELTASONE  Take 1 tablet (10 mg total) by mouth daily. 2 tabs a day x 5 days     rivaroxaban 20 MG Tabs tablet  Commonly known as:  XARELTO  Take 1 tablet (20 mg total) by mouth daily with supper.     temazepam 30 MG capsule  Commonly known as:  RESTORIL  Take 1 capsule (30 mg total) by mouth at bedtime as needed for sleep.     triamterene-hydrochlorothiazide 37.5-25 MG per tablet  Commonly known as:  MAXZIDE-25  Take 0.5 tablets by mouth daily.           Objective:   Physical Exam BP 122/68 mmHg  Pulse 88  Temp(Src) 98.5 F (36.9 C) (Oral)  Ht 5\' 8"  (1.727 m)  Wt 254 lb 2 oz (115.27 kg)  BMI 38.65 kg/m2  SpO2 96% General:   Well developed, well nourished . NAD.  HEENT:  Normocephalic . Face symmetric, atraumatic TMs: Left a slightly red, right normal. Throat symmetric, no red or discharge, nose is slightly congested Lungs:  Few rhonchi, scattered wheezing. + audible wheezing but normal respiratory effort, no intercostal retractions, no accessory muscle use. Heart: RRR,  no murmur.  No pretibial edema bilaterally  Skin: Not pale. Not jaundice Calves symmetric, no TTP Neurologic:  alert & oriented X3.  Speech normal, gait appropriate for age and unassisted Psych--  Cognition and judgment appear intact.  Cooperative with normal attention span and concentration.  Behavior appropriate. No anxious or depressed appearing.       Assessment & Plan:    Acute bronchitis and bronchospasm The patient has no history of asthma, developed respiratory symptoms consistent with bronchitis/bronchospasm after trip from [place]. She is on xarelto. Plan: See instructions (allergic to morphine, intolerant to hydrocodone, cough control with Mucinex and Tessalon Perles)

## 2014-10-07 NOTE — Patient Instructions (Signed)
Rest, fluids , tylenol  If  cough, take Mucinex DM twice a day as needed  If the cough continue, use Tessalon Perles If increasing or persisting cough: Use albuterol 2 puffs 4 times a day  If nasal  congestion use OTC Nasocort or Flonase : 2 nasal sprays on each side of the nose daily until you feel better  Prednisone as prescribed Prednisone may increase your blood sugar, check it once a day,  Stop prednisone if it is more than 180  Take the antibiotic as prescribed  (zithromax)  Call if not gradually better over the next  10 days Call anytime if the symptoms are severe

## 2014-10-07 NOTE — Progress Notes (Signed)
Pre visit review using our clinic review tool, if applicable. No additional management support is needed unless otherwise documented below in the visit note. 

## 2014-10-12 ENCOUNTER — Telehealth: Payer: Self-pay | Admitting: Internal Medicine

## 2014-10-12 NOTE — Telephone Encounter (Signed)
Spoke with Pt, she informed me that she has been using Mucinex DM and Tessalon Perles as prescribed. She is requesting another medication to help break up her chest congestion because she feels like Mucinex DM is not strong enough. I informed Pt that I would ask Dr. Larose Kells for recommendations and will call back. Pt verbalized understanding.

## 2014-10-12 NOTE — Telephone Encounter (Signed)
Please advise, Pt was seen on 10/07/2014 for Bronchitis.

## 2014-10-12 NOTE — Telephone Encounter (Signed)
Due to allergies, her options are limited. Recommend Mucinex, Tessalon Perles (already prescribed), use albuterol as needed for wheezing but also for persistent cough.

## 2014-10-12 NOTE — Telephone Encounter (Signed)
Relation to pt:  Self  Call back number: [phone] Pharmacy: [facility], [place] - [address] [phone] (Phone) [phone] (Fax)         Reason for call:  Pt stopped taking predniSONE due to blood sugar increase, pt requesting a RX for her cough. Please advise

## 2014-10-12 NOTE — Telephone Encounter (Signed)
Again her options are limited. I do recommend to continue with Mucinex DM, tessalon and  use albuterol 3 times a day for 3 days (standing, not PRN)

## 2014-10-13 NOTE — Telephone Encounter (Signed)
Spoke with Pt, informed her of Dr. Larose Kells recommendations. Instructed Pt to call in several days if she is still not getting better. Pt verbalized understanding.

## 2014-10-15 ENCOUNTER — Encounter: Payer: Self-pay | Admitting: *Deleted

## 2014-10-19 ENCOUNTER — Other Ambulatory Visit: Payer: Self-pay

## 2014-11-02 ENCOUNTER — Other Ambulatory Visit: Payer: Self-pay

## 2014-11-03 ENCOUNTER — Encounter: Payer: Self-pay | Admitting: Internal Medicine

## 2014-11-03 ENCOUNTER — Ambulatory Visit (INDEPENDENT_AMBULATORY_CARE_PROVIDER_SITE_OTHER): Payer: Federal, State, Local not specified - PPO | Admitting: Internal Medicine

## 2014-11-03 VITALS — BP 130/86 | HR 78 | Temp 98.5°F | Ht 68.0 in | Wt 256.4 lb

## 2014-11-03 DIAGNOSIS — M797 Fibromyalgia: Secondary | ICD-10-CM | POA: Diagnosis not present

## 2014-11-03 DIAGNOSIS — I10 Essential (primary) hypertension: Secondary | ICD-10-CM | POA: Diagnosis not present

## 2014-11-03 DIAGNOSIS — E049 Nontoxic goiter, unspecified: Secondary | ICD-10-CM

## 2014-11-03 NOTE — Progress Notes (Signed)
Subjective:    Patient ID: Jennifer [name], female    DOB: 06/19/1950, 64 y.o.   MRN: [id number]  DOS:  11/03/2014 Type of visit - description : Follow-up Interval history: Diabetes, ambulatory blood sugars between 90 and 120. Fibromyalgia, worse in the last few weeks, "everything hurts". Wonders if it is something different that fibromyalgia, family history of rheumatoid arthritis. Denies any swelling or redness on the hands or wrists. Also has a more pronounced pain at the whole right leg, saw a orthopedic doctor x-rays were done. No back pain  Complain of lower extremity edema for the last 2 or 3 weeks, similar than when she flies in the plane.   Review of Systems Denies chest pain or difficulty breathing No palpitations No nausea, vomiting, diarrhea Occasionally sees red blood per rectum when she is constipated, believes is from her hemorrhoids.  Past Medical History  Diagnosis Date  . Esophageal stenosis     Stricture after fundoplication REDO  . Tinnitus     Subjective  . Mitral valve prolapse   . GERD (gastroesophageal reflux disease)   . OSA (obstructive sleep apnea)     on CPAP-Mild - Sleep study 07-31-2002  . Pulmonary embolus     07/30/08 and 12-2013  . Chronic chest pain   . Shortness of breath     with exertion  . Hypertension   . IBS (irritable bowel syndrome)   . Anemia   . Colon polyps     inflamed partially ulcerated hyperplastic polyp  . Diverticulosis   . Hemorrhoids   . Zoster 07/30/2012    R flank  . Diabetes 09/24/2013  . Dysphagia esophageal phase - chronic after 3 fundoplications 07/31/8117  . Anxiety     Past Surgical History  Procedure Laterality Date  . Brain surgery  07/30/93    Stem surgery, Arnold Chiari Malformation  . Breast reduction surgery    . Appendectomy    . Abdominal hysterectomy      still has 1 ovary   . Nissen fundoplication      X 3 lab, open x 2 last with mesh  . Cholecystectomy  07-31-06  . Hernia repair      Hital Hernia  . Knee  arthroscopy  05/16/2012    Procedure: ARTHROSCOPY KNEE;  Surgeon: Sharmon Revere, MD;  Location: [facility];  Service: Orthopedics;  Laterality: Left;  . Colonoscopy    . Esophagogastroduodenoscopy      History   Social History  . Marital Status: Married    Spouse Name: N/A  . Number of Children: 1  . Years of Education: N/A   Occupational History  . [employer]   . TRAINING [employer]    Social History Main Topics  . Smoking status: Never Smoker   . Smokeless tobacco: Never Used     Comment: never used tobacco  . Alcohol Use: No  . Drug Use: No  . Sexual Activity: Yes    Birth Control/ Protection: None   Other Topics Concern  . Not on file   Social History Narrative   [employer] Graduate - [employer] school   Married July 30, 2068 - 2 years divorced; married 1984/07/30 - 2.5 years divorced; married 30-Jul-1988 - 1 year divorced; married '00   1 son - 07/31/70   Sister - Died @ 49 [place]            Medication List       This list is accurate as of: 11/03/14 11:59 PM.  Always use your most recent med list.               ACIPHEX 20 MG tablet  Generic drug:  RABEprazole  TAKE ONE TABLET BY MOUTH TWICE DAILY     albuterol 108 (90 BASE) MCG/ACT inhaler  Commonly known as:  VENTOLIN HFA  Inhale 2 puffs into the lungs every 6 (six) hours as needed for wheezing or shortness of breath.     ALPRAZolam 0.5 MG tablet  Commonly known as:  XANAX  Take 1 tablet (0.5 mg total) by mouth daily as needed for anxiety.     cycloSPORINE 0.05 % ophthalmic emulsion  Commonly known as:  RESTASIS  Place 1 drop into both eyes daily.     glucose blood test strip  Commonly known as:  ONE TOUCH ULTRA TEST  Check once daily     hyoscyamine 0.125 MG SL tablet  Commonly known as:  LEVSIN SL  Place 1 tablet (0.125 mg total) under the tongue every 4 (four) hours as needed.     LEXAPRO 10 MG tablet  Generic drug:  escitalopram  Take 1 tablet (10 mg total) by mouth daily.     lidocaine 5 %  Commonly known as:  LIDODERM  Place  1-2 patches onto the skin daily as needed. Remove & Discard patch within 12 hours or as directed by MD     rivaroxaban 20 MG Tabs tablet  Commonly known as:  XARELTO  Take 1 tablet (20 mg total) by mouth daily with supper.     temazepam 30 MG capsule  Commonly known as:  RESTORIL  Take 1 capsule (30 mg total) by mouth at bedtime as needed for sleep.     triamterene-hydrochlorothiazide 37.5-25 MG per tablet  Commonly known as:  MAXZIDE-25  Take 1 tablet by mouth daily.           Objective:   Physical Exam BP 130/86 mmHg  Pulse 78  Temp(Src) 98.5 F (36.9 C) (Oral)  Ht 5\' 8"  (1.727 m)  Wt 256 lb 6 oz (116.291 kg)  BMI 38.99 kg/m2  SpO2 96% General:   Well developed, well nourished . NAD.  HEENT:  Normocephalic . Face symmetric, atraumatic Lungs:  CTA B Normal respiratory effort, no intercostal retractions, no accessory muscle use. Heart: RRR,  no murmur.  +/+++  pretibial and dorsal edema bilaterally  Good pedal pulses bilaterally. Calves symmetric Skin: Not pale. Not jaundice  Neurologic:  alert & oriented X3.  Speech normal, gait appropriate for age and unassisted Psych--  Cognition and judgment appear intact.  Cooperative with normal attention span and concentration.  Behavior appropriate. Apprehensive but no depressed appearing.        Assessment & Plan:

## 2014-11-03 NOTE — Assessment & Plan Note (Addendum)
on Maxzide half tablet daily, BP well-controlled but she has developed edema. Plan: Increase Maxzide to one tablet daily, leg elevation low-salt diet

## 2014-11-03 NOTE — Assessment & Plan Note (Addendum)
Disease more active lately, continue Lexapro, integrative therapies?

## 2014-11-03 NOTE — Patient Instructions (Signed)
Low salt diet  Increase Maxzide to one tablet daily   Leg elevation one hour twice a day

## 2014-11-03 NOTE — Progress Notes (Signed)
Pre visit review using our clinic review tool, if applicable. No additional management support is needed unless otherwise documented below in the visit note. 

## 2014-11-13 ENCOUNTER — Other Ambulatory Visit: Payer: Self-pay | Admitting: Internal Medicine

## 2014-11-16 ENCOUNTER — Other Ambulatory Visit: Payer: Self-pay

## 2014-11-16 ENCOUNTER — Telehealth: Payer: Self-pay | Admitting: Internal Medicine

## 2014-11-16 ENCOUNTER — Other Ambulatory Visit: Payer: Self-pay | Admitting: Internal Medicine

## 2014-11-16 MED ORDER — TRIAMTERENE-HCTZ 37.5-25 MG PO TABS: 1.0000 | ORAL_TABLET | Freq: Every day | ORAL | Status: DC

## 2014-11-16 NOTE — Telephone Encounter (Signed)
Caller name: Relationship to patient: Can be reached: Pharmacy: [facility]   Reason for call: Pt needs new RX sent in for triamterene-hydrochlorothiazide (MAXZIDE-25) 37.5-25 MG per tablet. She was taking 1/2 pill but pharmacy said she cannot refill right now b/c of how old RX is written. Pt states Dr. Larose Kells advised her to take 1 pill a day now. Please send new rx. Also pt needing refill on restoril (see refill requests)

## 2014-11-16 NOTE — Telephone Encounter (Signed)
Rx printed, awaiting MD signature.  

## 2014-11-16 NOTE — Telephone Encounter (Signed)
Pt is requesting refill on Temazepam.  Last OV: 11/03/2014 Last Fill: 08/11/2014 #30 2RF UDS: 07/03/2014 Low risk  Please advise.

## 2014-11-16 NOTE — Telephone Encounter (Signed)
Rx faxed to [facility].

## 2014-11-16 NOTE — Telephone Encounter (Signed)
Maxzide sent to [facility] as requested. Temazepam refill request sent to Dr. Birdie Riddle for approval in Dr. Ethel Rana absence.

## 2014-11-16 NOTE — Telephone Encounter (Signed)
Ok for #30, no refills 

## 2014-11-20 ENCOUNTER — Emergency Department (HOSPITAL_COMMUNITY)
Admission: EM | Admit: 2014-11-20 | Discharge: 2014-11-21 | Disposition: A | Discharge status: Home / Self Care | Payer: Federal, State, Local not specified - PPO | Attending: Emergency Medicine | Admitting: Emergency Medicine

## 2014-11-20 DIAGNOSIS — Z8619 Personal history of other infectious and parasitic diseases: Secondary | ICD-10-CM | POA: Insufficient documentation

## 2014-11-20 DIAGNOSIS — Z7901 Long term (current) use of anticoagulants: Secondary | ICD-10-CM | POA: Insufficient documentation

## 2014-11-20 DIAGNOSIS — G4733 Obstructive sleep apnea (adult) (pediatric): Secondary | ICD-10-CM | POA: Diagnosis not present

## 2014-11-20 DIAGNOSIS — Y9389 Activity, other specified: Secondary | ICD-10-CM | POA: Insufficient documentation

## 2014-11-20 DIAGNOSIS — Z862 Personal history of diseases of the blood and blood-forming organs and certain disorders involving the immune mechanism: Secondary | ICD-10-CM | POA: Insufficient documentation

## 2014-11-20 DIAGNOSIS — E119 Type 2 diabetes mellitus without complications: Secondary | ICD-10-CM | POA: Diagnosis not present

## 2014-11-20 DIAGNOSIS — S8992XA Unspecified injury of left lower leg, initial encounter: Secondary | ICD-10-CM | POA: Diagnosis present

## 2014-11-20 DIAGNOSIS — Y9289 Other specified places as the place of occurrence of the external cause: Secondary | ICD-10-CM | POA: Diagnosis not present

## 2014-11-20 DIAGNOSIS — F419 Anxiety disorder, unspecified: Secondary | ICD-10-CM | POA: Diagnosis not present

## 2014-11-20 DIAGNOSIS — W01198A Fall on same level from slipping, tripping and stumbling with subsequent striking against other object, initial encounter: Secondary | ICD-10-CM | POA: Diagnosis not present

## 2014-11-20 DIAGNOSIS — Z86010 Personal history of colonic polyps: Secondary | ICD-10-CM | POA: Insufficient documentation

## 2014-11-20 DIAGNOSIS — Z86711 Personal history of pulmonary embolism: Secondary | ICD-10-CM | POA: Insufficient documentation

## 2014-11-20 DIAGNOSIS — Y998 Other external cause status: Secondary | ICD-10-CM | POA: Diagnosis not present

## 2014-11-20 DIAGNOSIS — S8012XA Contusion of left lower leg, initial encounter: Secondary | ICD-10-CM | POA: Diagnosis not present

## 2014-11-20 DIAGNOSIS — Z8719 Personal history of other diseases of the digestive system: Secondary | ICD-10-CM | POA: Insufficient documentation

## 2014-11-20 DIAGNOSIS — T148XXA Other injury of unspecified body region, initial encounter: Secondary | ICD-10-CM

## 2014-11-20 DIAGNOSIS — Z79899 Other long term (current) drug therapy: Secondary | ICD-10-CM | POA: Insufficient documentation

## 2014-11-20 DIAGNOSIS — G8929 Other chronic pain: Secondary | ICD-10-CM | POA: Insufficient documentation

## 2014-11-20 DIAGNOSIS — Z9981 Dependence on supplemental oxygen: Secondary | ICD-10-CM | POA: Insufficient documentation

## 2014-11-20 DIAGNOSIS — W19XXXA Unspecified fall, initial encounter: Secondary | ICD-10-CM

## 2014-11-20 NOTE — ED Notes (Signed)
Pt arrived to the ED with a complaint of leg pain.  Pt was getting into a new bed which had rails and when she attempted to get in she tripped and hit her left lower leg on the posterior side above the calf.  Pt feels the leg is swollen and is getting hard.  Pt is on blood thinners and was told by on call PCP to come to ED to check for thrombus.

## 2014-11-21 ENCOUNTER — Emergency Department (HOSPITAL_COMMUNITY): Payer: Federal, State, Local not specified - PPO

## 2014-11-21 MED ORDER — HYDROMORPHONE HCL 2 MG/ML IJ SOLN
2.0000 mg | Freq: Once | INTRAMUSCULAR | Status: AC
  Administered 2014-11-21: 2 mg via INTRAMUSCULAR
  Filled 2014-11-21: qty 1

## 2014-11-21 NOTE — ED Provider Notes (Signed)
CSN: [id number]     Arrival date & time 11/20/14  2246 History  This chart was scribed for Everlene Balls, MD by Julien Nordmann, ED Scribe. This patient was seen in room WA10/WA10 and the patient's care was started at 12:24 AM.    Chief Complaint  Patient presents with  . Leg Pain      The history is provided by the patient. No language interpreter was used.   HPI Comments: Jennifer [name] is a 65 y.o. female who has a hx of PE presents to the Emergency Department complaining of constant, gradual worsening left leg pain and swelling onset 5 hours ago. She notes the pain is radiating down her leg. Pt reports calling on her left leg, hitting the top of her leg on the side of the bed rail. She states taking OTC tylenol about 4 hours ago to alleviate the pain with no relief. She called her PCP and they recommended her coming to get evaluated by the ED. Pt is currently on Xarelto for blood clots.   Past Medical History  Diagnosis Date  . Esophageal stenosis     Stricture after fundoplication REDO  . Tinnitus     Subjective  . Mitral valve prolapse   . GERD (gastroesophageal reflux disease)   . OSA (obstructive sleep apnea)     on CPAP-Mild - Sleep study 2004  . Pulmonary embolus     2010 and 12-2013  . Chronic chest pain   . Shortness of breath     with exertion  . Hypertension   . IBS (irritable bowel syndrome)   . Anemia   . Colon polyps     inflamed partially ulcerated hyperplastic polyp  . Diverticulosis   . Hemorrhoids   . Zoster 2014    R flank  . Diabetes 09/24/2013  . Dysphagia esophageal phase - chronic after 3 fundoplications 6/96/7893  . Anxiety    Past Surgical History  Procedure Laterality Date  . Brain surgery  1995    Stem surgery, Arnold Chiari Malformation  . Breast reduction surgery    . Appendectomy    . Abdominal hysterectomy      still has 1 ovary   . Nissen fundoplication      X 3 lab, open x 2 last with mesh  . Cholecystectomy  2008  . Hernia repair       Hital Hernia  . Knee arthroscopy  05/16/2012    Procedure: ARTHROSCOPY KNEE;  Surgeon: Sharmon Revere, MD;  Location: [facility];  Service: Orthopedics;  Laterality: Left;  . Colonoscopy    . Esophagogastroduodenoscopy     Family History  Problem Relation Age of Onset  . Diabetes Mother   . Rheum arthritis Mother   . Hypertension Mother   . Hypertension Father   . Rheum arthritis Sister   . Hypertension Sister   . Colon cancer Maternal Grandmother   . Rheum arthritis Sister   . Rheum arthritis Sister   . Rheum arthritis Sister   . Breast cancer Neg Hx   . CAD Sister     MI     History  Substance Use Topics  . Smoking status: Never Smoker   . Smokeless tobacco: Never Used     Comment: never used tobacco  . Alcohol Use: No   OB History    Gravida Para Term Preterm AB TAB SAB Ectopic Multiple Living   1 1 1  0 0 0 0 0 0 1  Review of Systems  A complete 10 system review of systems was obtained and all systems are negative except as noted in the HPI and PMH.    Allergies  Morphine; Morphine and related; Promethazine hcl; and Sulfonamide derivatives  Home Medications   Prior to Admission medications   Medication Sig Start Date End Date Taking? Authorizing Provider  ACIPHEX 20 MG tablet TAKE ONE TABLET BY MOUTH TWICE DAILY 08/05/14  Yes Gatha Mayer, MD  albuterol (VENTOLIN HFA) 108 (90 BASE) MCG/ACT inhaler Inhale 2 puffs into the lungs every 6 (six) hours as needed for wheezing or shortness of breath. 10/07/14  Yes Colon Branch, MD  cycloSPORINE (RESTASIS) 0.05 % ophthalmic emulsion Place 1 drop into both eyes 2 (two) times daily.    Yes Historical Provider, MD  DULoxetine (CYMBALTA) 30 MG capsule Take 30 mg by mouth daily.   Yes Historical Provider, MD  LEXAPRO 10 MG tablet Take 1 tablet (10 mg total) by mouth daily. 08/04/14  Yes Colon Branch, MD  rivaroxaban (XARELTO) 20 MG TABS tablet Take 1 tablet (20 mg total) by mouth daily with supper. 09/04/14  Yes Colon Branch, MD   temazepam (RESTORIL) 30 MG capsule Take 1 capsule (30 mg total) by mouth at bedtime as needed for sleep. 11/16/14  Yes Midge Minium, MD  triamterene-hydrochlorothiazide (MAXZIDE-25) 37.5-25 MG per tablet Take 1 tablet by mouth daily. Patient taking differently: Take 0.5 tablets by mouth daily.  11/16/14  Yes Colon Branch, MD  ALPRAZolam Duanne Moron) 0.5 MG tablet Take 1 tablet (0.5 mg total) by mouth daily as needed for anxiety. Patient taking differently: Take 0.5 mg by mouth daily as needed for anxiety. If she travels 08/04/14   Colon Branch, MD  ANUCORT-HC 25 MG suppository PLACE 1 SUPPOSITORY RECTALLY AT BEDTIME FOR 5-7 NIGHTS AND THEN AS NEEDED Patient not taking: Reported on 11/21/2014 11/13/14   Gatha Mayer, MD  glucose blood (ONE TOUCH ULTRA TEST) test strip Check once daily Patient not taking: Reported on 08/04/2014 11/06/13   Colon Branch, MD  hyoscyamine (LEVSIN SL) 0.125 MG SL tablet Place 1 tablet (0.125 mg total) under the tongue every 4 (four) hours as needed. Patient taking differently: Place 0.125 mg under the tongue every 4 (four) hours as needed for cramping.  01/20/14   Gatha Mayer, MD  lidocaine (LIDODERM) 5 % Place 1-2 patches onto the skin daily as needed. Remove & Discard patch within 12 hours or as directed by MD Patient taking differently: Place 1-2 patches onto the skin daily as needed. As needed if shingles comes back. Remove & Discard patch within 12 hours or as directed by MD 07/03/14   Colon Branch, MD   Triage vitals: BP 132/70 mmHg  Pulse 71  Temp(Src) 98 F (36.7 C) (Oral)  Resp 20  Ht 5\' 8"  (1.727 m)  Wt 255 lb (115.667 kg)  BMI 38.78 kg/m2  SpO2 96% Physical Exam  Constitutional: She is oriented to person, place, and time. She appears well-developed and well-nourished. No distress.  HENT:  Head: Normocephalic and atraumatic.  Nose: Nose normal.  Mouth/Throat: Oropharynx is clear and moist. No oropharyngeal exudate.  Eyes: Conjunctivae and EOM are normal.  Pupils are equal, round, and reactive to light. No scleral icterus.  Neck: Normal range of motion. Neck supple. No JVD present. No tracheal deviation present. No thyromegaly present.  Cardiovascular: Normal rate, regular rhythm and normal heart sounds.  Exam reveals no gallop and  no friction rub.   No murmur heard. Pulmonary/Chest: Effort normal and breath sounds normal. No respiratory distress. She has no wheezes. She exhibits no tenderness.  Abdominal: Soft. Bowel sounds are normal. She exhibits no distension and no mass. There is no tenderness. There is no rebound and no guarding.  Musculoskeletal: Normal range of motion. She exhibits tenderness. She exhibits no edema.  Left leg has swelling and tenderness to left proximal medial tibia There is hematoma formation compartments are soft normal pulses and sensation distally Small abrasion on right elbow.  Lymphadenopathy:    She has no cervical adenopathy.  Neurological: She is alert and oriented to person, place, and time. No cranial nerve deficit. She exhibits normal muscle tone.  Skin: Skin is warm and dry. No rash noted. No erythema. No pallor.  Nursing note and vitals reviewed.   ED Course  Procedures  DIAGNOSTIC STUDIES: Oxygen Saturation is 96% on RA, adequate by my interpretation.  COORDINATION OF CARE:  12:26 AM Discussed treatment plan which includes x-ray left leg, pain medication with pt at bedside and pt agreed to plan.  Labs Review Labs Reviewed - No data to display  Imaging Review Dg Tibia/fibula Left  11/21/2014   CLINICAL DATA:  Pain and swelling after striking left lower leg on a bed rail. Anticoagulated.  EXAM: LEFT TIBIA AND FIBULA - 2 VIEW  COMPARISON:  None.  FINDINGS: Negative for fracture, dislocation or radiopaque foreign body. Moderately severe osteoarthritic changes are present about the lateral and patellofemoral compartments of the knee, incompletely imaged.  IMPRESSION: Negative for acute fracture.  No  foreign body is evident.   Electronically Signed   By: Andreas Newport M.D.   On: 11/21/2014 01:17     EKG Interpretation None      MDM   Final diagnoses:  None   Patient presents to the emergency department after a fall. X-ray is negative for fracture or foreign bodies. Physical exam shows significant hematoma formation. She was given Dilaudid in the emergency department for pain control as she has tolerated this in the past. She has a documented anaphylactic reaction to morphine to narcotics would not be prescribed. She is advised to continue Tylenol at home for pain control and see her primary care doctor in 3 days for close follow-up. Compartments are soft, there is no concern for compartment syndrome. She otherwise appears well in no acute distress. Her vital signs remain within her normal limits and she is safe for discharge.  I personally performed the services described in this documentation, which was scribed in my presence. The recorded information has been reviewed and is accurate.   Everlene Balls, MD 11/21/14 [phone]

## 2014-11-21 NOTE — Discharge Instructions (Signed)
Fall Prevention and Home Safety Ms. Hehl, use Tylenol 1000 mg every 8 hours as needed for pain control. See your primary care physician within 3 days for close follow-up. If symptoms worsen come back to emergency department immediately. Thank you. Falls cause injuries and can affect all age groups. It is possible to prevent falls.  HOW TO PREVENT FALLS  Wear shoes with rubber soles that do not have an opening for your toes.  Keep the inside and outside of your house well lit.  Use night lights throughout your home.  Remove clutter from floors.  Clean up floor spills.  Remove throw rugs or fasten them to the floor with carpet tape.  Do not place electrical cords across pathways.  Put grab bars by your tub, shower, and toilet. Do not use towel bars as grab bars.  Put handrails on both sides of the stairway. Fix loose handrails.  Do not climb on stools or stepladders, if possible.  Do not wax your floors.  Repair uneven or unsafe sidewalks, walkways, or stairs.  Keep items you use a lot within reach.  Be aware of pets.  Keep emergency numbers next to the telephone.  Put smoke detectors in your home and near bedrooms. Ask your doctor what other things you can do to prevent falls. Document Released: 02/04/2009 Document Revised: 10/10/2011 Document Reviewed: 07/11/2011 [facility] Patient Information 2015 [place], [place]. This information is not intended to replace advice given to you by your health care provider. Make sure you discuss any questions you have with your health care provider. Hematoma A hematoma is a collection of blood. The collection of blood can turn into a hard, painful lump under the skin. Your skin may turn blue or yellow if the hematoma is close to the surface of the skin. Most hematomas get better in a few days to weeks. Some hematomas are serious and need medical care. Hematomas can be very small or very big. HOME CARE  Apply ice to the injured area:  Put  ice in a plastic bag.  Place a towel between your skin and the bag.  Leave the ice on for 20 minutes, 2-3 times a day for the first 1 to 2 days.  After the first 2 days, switch to using warm packs on the injured area.  Raise (elevate) the injured area to lessen pain and puffiness (swelling). You may also wrap the area with an elastic bandage. Make sure the bandage is not wrapped too tight.  If you have a painful hematoma on your leg or foot, you may use crutches for a couple days.  Only take medicines as told by your doctor. GET HELP RIGHT AWAY IF:   Your pain gets worse.  Your pain is not controlled with medicine.  You have a fever.  Your puffiness gets worse.  Your skin turns more blue or yellow.  Your skin over the hematoma breaks or starts bleeding.  Your hematoma is in your chest or belly (abdomen) and you are short of breath, feel weak, or have a change in consciousness.  Your hematoma is on your scalp and you have a headache that gets worse or a change in alertness or consciousness. MAKE SURE YOU:   Understand these instructions.  Will watch your condition.  Will get help right away if you are not doing well or get worse. Document Released: 05/18/2004 Document Revised: 12/11/2012 Document Reviewed: 09/18/2012 [facility] Patient Information 2015 [place], [place]. This information is not intended to replace advice given  to you by your health care provider. Make sure you discuss any questions you have with your health care provider.

## 2014-11-23 ENCOUNTER — Telehealth: Payer: Self-pay | Admitting: *Deleted

## 2014-11-23 NOTE — Telephone Encounter (Signed)
No cream is recommended but she should continue with leg elevation, ice and Tylenol. If she gets a lot worse needs to let us know or go to the ER

## 2014-11-23 NOTE — Telephone Encounter (Signed)
This is dr paz's pt

## 2014-11-23 NOTE — Telephone Encounter (Signed)
Patient was seen in ED 11/20/14 for fall with hematoma on leg.  She states they evaluated and sent her home, it is still swollen and painful.  She is taking Xarelto, so bruise is very large.  She states she will elevate, place ice pack, and is taking Tylenol for pain but wonders if there is anything else she can put on the area (cream?).   She scheduled office follow-up 11/24/14 at 11:30.   Please advise.

## 2014-11-23 NOTE — Telephone Encounter (Signed)
Please see below.

## 2014-11-24 ENCOUNTER — Encounter: Payer: Self-pay | Admitting: Internal Medicine

## 2014-11-24 ENCOUNTER — Ambulatory Visit (INDEPENDENT_AMBULATORY_CARE_PROVIDER_SITE_OTHER): Payer: Federal, State, Local not specified - PPO | Admitting: Internal Medicine

## 2014-11-24 VITALS — BP 124/68 | HR 74 | Temp 98.1°F | Ht 68.0 in | Wt 253.2 lb

## 2014-11-24 DIAGNOSIS — S8012XD Contusion of left lower leg, subsequent encounter: Secondary | ICD-10-CM | POA: Diagnosis not present

## 2014-11-24 NOTE — Progress Notes (Signed)
Pre visit review using our clinic review tool, if applicable. No additional management support is needed unless otherwise documented below in the visit note. 

## 2014-11-24 NOTE — Telephone Encounter (Signed)
Notified patient, she plans to come to appointment at 11:30, states the bruise grew since she was in the ED but is getting less hard.  She has been elevating and using ice but is still having pain.

## 2014-11-24 NOTE — Progress Notes (Signed)
Subjective:    Patient ID: Jennifer [name], female    DOB: 26-Apr-1950, 64 y.o.   MRN: [id number]  DOS:  11/24/2014 Type of visit - description : ER follow-up Interval history: Martin Majestic to the ER 11/20/2014 after an injury of the left leg, chart reviewed: x-rays were negative, she was diagnosed with a hematoma and discharged home. Since then, the swelling has decrease, she continue with pain. Wonders if she could discontinue Xarelto.    Review of Systems Denies fever chills No chest pain or difficulty breathing No nausea, vomiting, blood in the stools. No blood in the urine.   Past Medical History  Diagnosis Date  . Esophageal stenosis     Stricture after fundoplication REDO  . Tinnitus     Subjective  . Mitral valve prolapse   . GERD (gastroesophageal reflux disease)   . OSA (obstructive sleep apnea)     on CPAP-Mild - Sleep study 25-Jul-2002  . Pulmonary embolus     24-Jul-2008 and 12-2013  . Chronic chest pain   . Shortness of breath     with exertion  . Hypertension   . IBS (irritable bowel syndrome)   . Anemia   . Colon polyps     inflamed partially ulcerated hyperplastic polyp  . Diverticulosis   . Hemorrhoids   . Zoster 2012/07/24    R flank  . Diabetes 09/24/2013  . Dysphagia esophageal phase - chronic after 3 fundoplications 9/47/6546  . Anxiety     Past Surgical History  Procedure Laterality Date  . Brain surgery  07/24/1993    Stem surgery, Arnold Chiari Malformation  . Breast reduction surgery    . Appendectomy    . Abdominal hysterectomy      still has 1 ovary   . Nissen fundoplication      X 3 lab, open x 2 last with mesh  . Cholecystectomy  07/25/2006  . Hernia repair      Hital Hernia  . Knee arthroscopy  05/16/2012    Procedure: ARTHROSCOPY KNEE;  Surgeon: Sharmon Revere, MD;  Location: [place];  Service: Orthopedics;  Laterality: Left;  . Colonoscopy    . Esophagogastroduodenoscopy      History   Social History  . Marital Status: Married    Spouse Name: N/A  . Number of  Children: 1  . Years of Education: N/A   Occupational History  . [employer]   . TRAINING [employer]    Social History Main Topics  . Smoking status: Never Smoker   . Smokeless tobacco: Never Used     Comment: never used tobacco  . Alcohol Use: No  . Drug Use: No  . Sexual Activity: Yes    Birth Control/ Protection: None   Other Topics Concern  . Not on file   Social History Narrative   [employer] Graduate - Technical school   Married 07-24-2068 - 2 years divorced; married 24-Jul-1984 - 2.5 years divorced; married Jul 24, 1988 - 1 year divorced; married '00   1 son - 07/25/70   Sister - Died @ 29 [place]            Medication List       This list is accurate as of: 11/24/14  5:24 PM.  Always use your most recent med list.               ACIPHEX 20 MG tablet  Generic drug:  RABEprazole  TAKE ONE TABLET BY MOUTH TWICE DAILY  albuterol 108 (90 BASE) MCG/ACT inhaler  Commonly known as:  VENTOLIN HFA  Inhale 2 puffs into the lungs every 6 (six) hours as needed for wheezing or shortness of breath.     ALPRAZolam 0.5 MG tablet  Commonly known as:  XANAX  Take 1 tablet (0.5 mg total) by mouth daily as needed for anxiety.     ANUCORT-HC 25 MG suppository  Generic drug:  hydrocortisone  PLACE 1 SUPPOSITORY RECTALLY AT BEDTIME FOR 5-7 NIGHTS AND THEN AS NEEDED     cycloSPORINE 0.05 % ophthalmic emulsion  Commonly known as:  RESTASIS  Place 1 drop into both eyes 2 (two) times daily.     glucose blood test strip  Commonly known as:  ONE TOUCH ULTRA TEST  Check once daily     hyoscyamine 0.125 MG SL tablet  Commonly known as:  LEVSIN SL  Place 1 tablet (0.125 mg total) under the tongue every 4 (four) hours as needed.     LEXAPRO 10 MG tablet  Generic drug:  escitalopram  Take 1 tablet (10 mg total) by mouth daily.     lidocaine 5 %  Commonly known as:  LIDODERM  Place 1-2 patches onto the skin daily as needed. Remove & Discard patch within 12 hours or as directed by MD     rivaroxaban 20 MG Tabs  tablet  Commonly known as:  XARELTO  Take 1 tablet (20 mg total) by mouth daily with supper.     temazepam 30 MG capsule  Commonly known as:  RESTORIL  Take 1 capsule (30 mg total) by mouth at bedtime as needed for sleep.     triamterene-hydrochlorothiazide 37.5-25 MG per tablet  Commonly known as:  MAXZIDE-25  Take 1 tablet by mouth daily.           Objective:   Physical Exam  Musculoskeletal:       Legs:  BP 124/68 mmHg  Pulse 74  Temp(Src) 98.1 F (36.7 C) (Oral)  Ht 5\' 8"  (1.727 m)  Wt 253 lb 4 oz (114.873 kg)  BMI 38.52 kg/m2  SpO2 96% General:   Well developed, well nourished . NAD.  HEENT:  Normocephalic . Face symmetric, atraumatic Lower extremities: Calves  measured and symmetric, no pretibial edema. She does have a induration without fluctuance-redness at the left calve. See graphic.  Skin: Not pale. Not jaundice Neurologic:  alert & oriented X3.  Speech normal, gait appropriate for age and unassisted Psych--  Cognition and judgment appear intact.  Cooperative with normal attention span and concentration.  Behavior appropriate. No anxious or depressed appearing.      Assessment & Plan:  Hematoma, Resolving hematoma, to continue with Tylenol for pain control, avoid NSAIDs. See instructions  On Xarelto, patient wonders if she could discontinue anticoagulation, she was recommended to stay on it for 2 years, at this point benefits >> risk consequently recommend to stay on it.

## 2014-11-24 NOTE — Patient Instructions (Signed)
Continue Tylenol Leg elevation Warm compress Call if not back to normal in 2- 3 weeks Call if the area gets worse

## 2014-12-24 ENCOUNTER — Telehealth: Payer: Self-pay

## 2014-12-24 MED ORDER — TEMAZEPAM 30 MG PO CAPS: 30.0000 mg | ORAL_CAPSULE | Freq: Every evening | ORAL | Status: DC | PRN

## 2014-12-24 NOTE — Telephone Encounter (Signed)
Pt is requesting refill on Temazepam.  Last OV: 11/24/2014 Last Fill: 11/16/2014 #[id number] UDS: 07/03/2014 Low risk  Please advise.

## 2014-12-24 NOTE — Telephone Encounter (Signed)
Rx printed, awaiting MD signature.  

## 2014-12-24 NOTE — Telephone Encounter (Signed)
Rx faxed to [facility].

## 2014-12-24 NOTE — Telephone Encounter (Signed)
Ok 30 and 2 RF 

## 2015-01-04 ENCOUNTER — Other Ambulatory Visit (HOSPITAL_BASED_OUTPATIENT_CLINIC_OR_DEPARTMENT_OTHER): Payer: Federal, State, Local not specified - PPO

## 2015-01-04 ENCOUNTER — Ambulatory Visit (HOSPITAL_BASED_OUTPATIENT_CLINIC_OR_DEPARTMENT_OTHER): Payer: Federal, State, Local not specified - PPO

## 2015-01-04 ENCOUNTER — Encounter: Payer: Self-pay | Admitting: Family

## 2015-01-04 ENCOUNTER — Ambulatory Visit (HOSPITAL_BASED_OUTPATIENT_CLINIC_OR_DEPARTMENT_OTHER): Payer: Federal, State, Local not specified - PPO | Admitting: Family

## 2015-01-04 VITALS — BP 116/65 | HR 75 | Temp 98.2°F | Resp 16 | Ht 68.0 in | Wt 250.0 lb

## 2015-01-04 DIAGNOSIS — I2699 Other pulmonary embolism without acute cor pulmonale: Secondary | ICD-10-CM

## 2015-01-04 DIAGNOSIS — Z86711 Personal history of pulmonary embolism: Secondary | ICD-10-CM | POA: Diagnosis not present

## 2015-01-04 DIAGNOSIS — Z0000 Encounter for general adult medical examination without abnormal findings: Secondary | ICD-10-CM

## 2015-01-04 DIAGNOSIS — Z23 Encounter for immunization: Secondary | ICD-10-CM | POA: Diagnosis not present

## 2015-01-04 LAB — CBC WITH DIFFERENTIAL (CANCER CENTER ONLY)
BASO#: 0 10*3/uL (ref 0.0–0.2)
BASO%: 0.6 % (ref 0.0–2.0)
EOS%: 2.6 % (ref 0.0–7.0)
Eosinophils Absolute: 0.1 10*3/uL (ref 0.0–0.5)
HCT: 38.7 % (ref 34.8–46.6)
HGB: 12.7 g/dL (ref 11.6–15.9)
LYMPH#: 1.5 10*3/uL (ref 0.9–3.3)
LYMPH%: 43.6 % (ref 14.0–48.0)
MCH: 28.6 pg (ref 26.0–34.0)
MCHC: 32.8 g/dL (ref 32.0–36.0)
MCV: 87 fL (ref 81–101)
MONO#: 0.4 10*3/uL (ref 0.1–0.9)
MONO%: 12 % (ref 0.0–13.0)
NEUT#: 1.5 10*3/uL (ref 1.5–6.5)
NEUT%: 41.2 % (ref 39.6–80.0)
Platelets: 246 10*3/uL (ref 145–400)
RBC: 4.44 10*6/uL (ref 3.70–5.32)
RDW: 14.9 % (ref 11.1–15.7)
WBC: 3.5 10*3/uL — AB (ref 3.9–10.0)

## 2015-01-04 LAB — COMPREHENSIVE METABOLIC PANEL
ALBUMIN: 3.8 g/dL (ref 3.6–5.1)
ALT: 16 U/L (ref 6–29)
AST: 18 U/L (ref 10–35)
Alkaline Phosphatase: 51 U/L (ref 33–130)
BILIRUBIN TOTAL: 0.4 mg/dL (ref 0.2–1.2)
BUN: 18 mg/dL (ref 7–25)
CO2: 31 mmol/L (ref 20–31)
CREATININE: 0.9 mg/dL (ref 0.50–0.99)
Calcium: 8.9 mg/dL (ref 8.6–10.4)
Chloride: 103 mmol/L (ref 98–110)
GLUCOSE: 91 mg/dL (ref 65–99)
Potassium: 3.7 mmol/L (ref 3.5–5.3)
SODIUM: 143 mmol/L (ref 135–146)
Total Protein: 6.7 g/dL (ref 6.1–8.1)

## 2015-01-04 MED ORDER — INFLUENZA VAC SPLIT QUAD 0.5 ML IM SUSY
0.5000 mL | PREFILLED_SYRINGE | Freq: Once | INTRAMUSCULAR | Status: AC
  Administered 2015-01-04: 0.5 mL via INTRAMUSCULAR
  Filled 2015-01-04: qty 0.5

## 2015-01-04 NOTE — Patient Instructions (Signed)

## 2015-01-04 NOTE — Progress Notes (Signed)
Hematology and Oncology Follow Up Visit  Jennifer [name] [id number] 10/18/1950 64 y.o. 01/04/2015   Principle Diagnosis:  Recurrent pulmonary embolism - resolved   Current Therapy:   Xarelto 20 mg by mouth daily    Interim History:  Jennifer [name] is here today for follow-up. She is feeling fatigued. She has responded nicely to Xarelto. Her last CT angio in April showed complete resolution of her PE.  She is doing well controlling her diabetes with her diet. She is not on medication at this time. She checks her blood sugars daily and states that she stays below 110.  No infections. She has had no fever, chills, n/v, cough, rash, dizziness, headaches, blurred vision, chest pain, palpitations, abdominal pain, changes in bowel or bladder habits. She has occasional SOB with exertion that comes and goes.  She has had no episodes of bleeding or bruising.  She fell over a bed frame while trying to measure it and had a hematoma on the inner portion of her left calve. She still has some tenderness in that area but the hematoma and bruising have almost completely resolved. No swelling, numbness or tingling in her extremities. No new aches or pains.  She eating healthy and staying well hydrated. Her weight is stable.  Medications:    Medication List       This list is accurate as of: 01/04/15  3:15 PM.  Always use your most recent med list.               ACIPHEX 20 MG tablet  Generic drug:  RABEprazole  TAKE ONE TABLET BY MOUTH TWICE DAILY     albuterol 108 (90 BASE) MCG/ACT inhaler  Commonly known as:  VENTOLIN HFA  Inhale 2 puffs into the lungs every 6 (six) hours as needed for wheezing or shortness of breath.     ALPRAZolam 0.5 MG tablet  Commonly known as:  XANAX  Take 1 tablet (0.5 mg total) by mouth daily as needed for anxiety.     ANUCORT-HC 25 MG suppository  Generic drug:  hydrocortisone  PLACE 1 SUPPOSITORY RECTALLY AT BEDTIME FOR 5-7 NIGHTS AND THEN AS NEEDED     cycloSPORINE  0.05 % ophthalmic emulsion  Commonly known as:  RESTASIS  Place 1 drop into both eyes 2 (two) times daily.     glucose blood test strip  Commonly known as:  ONE TOUCH ULTRA TEST  Check once daily     hyoscyamine 0.125 MG SL tablet  Commonly known as:  LEVSIN SL  Place 1 tablet (0.125 mg total) under the tongue every 4 (four) hours as needed.     LEXAPRO 10 MG tablet  Generic drug:  escitalopram  Take 1 tablet (10 mg total) by mouth daily.     lidocaine 5 %  Commonly known as:  LIDODERM  Place 1-2 patches onto the skin daily as needed. Remove & Discard patch within 12 hours or as directed by MD     rivaroxaban 20 MG Tabs tablet  Commonly known as:  XARELTO  Take 1 tablet (20 mg total) by mouth daily with supper.     temazepam 30 MG capsule  Commonly known as:  RESTORIL  Take 1 capsule (30 mg total) by mouth at bedtime as needed for sleep.     triamterene-hydrochlorothiazide 37.5-25 MG per tablet  Commonly known as:  MAXZIDE-25  Take 1 tablet by mouth daily.        Allergies:  Allergies  Allergen Reactions  .  Morphine And Related Anaphylaxis and Shortness Of Breath  . Promethazine Hcl Other (See Comments)    Hallucinations  . Sulfonamide Derivatives Hives    Past Medical History, Surgical history, Social history, and Family History were reviewed and updated.  Review of Systems: All other 10 point review of systems is negative.   Physical Exam:  height is 5\' 8"  (1.727 m) and weight is 250 lb (113.399 kg). Her oral temperature is 98.2 F (36.8 C). Her blood pressure is 116/65 and her pulse is 75. Her respiration is 16.   Wt Readings from Last 3 Encounters:  01/04/15 250 lb (113.399 kg)  11/24/14 253 lb 4 oz (114.873 kg)  11/20/14 255 lb (115.667 kg)    Ocular: Sclerae unicteric, pupils equal, round and reactive to light Ear-nose-throat: Oropharynx clear, dentition fair Lymphatic: No cervical or supraclavicular adenopathy Lungs no rales or rhonchi, good  excursion bilaterally Heart regular rate and rhythm, no murmur appreciated Abd soft, nontender, positive bowel sounds MSK no focal spinal tenderness, no joint edema Neuro: non-focal, well-oriented, appropriate affect Breasts: Deferred  Lab Results  Component Value Date   WBC 3.5* 01/04/2015   HGB 12.7 01/04/2015   HCT 38.7 01/04/2015   MCV 87 01/04/2015   PLT 246 01/04/2015   Lab Results  Component Value Date   IRON 55 10/14/2007   Lab Results  Component Value Date   RBC 4.44 01/04/2015   No results found for: KPAFRELGTCHN, LAMBDASER, KAPLAMBRATIO No results found for: IGGSERUM, IGA, IGMSERUM No results found for: [name], SPEI   Chemistry      Component Value Date/Time   NA 142 08/03/2014 1203   NA 143 01/08/2014 1055   K 3.4 08/03/2014 1203   K 3.9 01/08/2014 1055   CL 108 08/03/2014 1203   CL 101 01/08/2014 1055   CO2 27 08/03/2014 1203   CO2 30 01/08/2014 1055   BUN 15 08/03/2014 1203   BUN 13 01/08/2014 1055   CREATININE 0.9 08/03/2014 1203   CREATININE 0.90 01/08/2014 1055      Component Value Date/Time   CALCIUM 8.5 08/03/2014 1203   CALCIUM 9.6 01/08/2014 1055   ALKPHOS 63 08/03/2014 1203   ALKPHOS 77 01/08/2014 1055   AST 22 08/03/2014 1203   AST 20 01/08/2014 1055   ALT 19 08/03/2014 1203   ALT 15 01/08/2014 1055   BILITOT 0.60 08/03/2014 1203   BILITOT 0.2* 01/08/2014 1055     Impression and Plan: Jennifer [name] is 64 year old African-American female with a history of recurrent pulmonary emboli. Her CT in April showed complete resolution of her PE. She is asymptomatic at this time and is doing well on anticoagulation.  She will continue on Xarelto 20 mg daily and will continue for 2 years. She is now one year in.   We will see her back in 6 months for labs and follow-up.  She knows to call here with any questions or concerns. We can certainly see her sooner if need be.   Eliezer Bottom,  NP 9/12/20163:15 PM

## 2015-01-05 LAB — D-DIMER, QUANTITATIVE: D-Dimer, Quant: 0.39 ug/mL-FEU (ref 0.00–0.48)

## 2015-01-19 ENCOUNTER — Other Ambulatory Visit: Payer: Self-pay | Admitting: Internal Medicine

## 2015-02-12 ENCOUNTER — Ambulatory Visit (INDEPENDENT_AMBULATORY_CARE_PROVIDER_SITE_OTHER): Payer: Federal, State, Local not specified - PPO | Admitting: Internal Medicine

## 2015-02-12 ENCOUNTER — Encounter: Payer: Self-pay | Admitting: Internal Medicine

## 2015-02-12 VITALS — BP 122/66 | HR 77 | Temp 97.8°F | Ht 68.0 in | Wt 253.2 lb

## 2015-02-12 DIAGNOSIS — S8012XD Contusion of left lower leg, subsequent encounter: Secondary | ICD-10-CM

## 2015-02-12 DIAGNOSIS — R5382 Chronic fatigue, unspecified: Secondary | ICD-10-CM | POA: Diagnosis not present

## 2015-02-12 DIAGNOSIS — Z09 Encounter for follow-up examination after completed treatment for conditions other than malignant neoplasm: Secondary | ICD-10-CM

## 2015-02-12 NOTE — Progress Notes (Signed)
Subjective:    Patient ID: Jennifer [name], female    DOB: 1950/06/23, 64 y.o.   MRN: [id number]  DOS:  02/12/2015 Type of visit - description : Acute visit Interval history: Notice 3 lumps at the right leg a few days ago, they are going away but they left a bruise. She is concerned. Having pain on the left calf since the leg injury. Went to see orthopedic doctor a couple months ago regards the pain , ultrasound showed no DVT. Note generalized fatigue for at least a year.    Review of Systems Stools are normal except for occasionally red blood in the toilet paper when she has a bowel movement. No chest pain or difficulty breathing today.   Past Medical History  Diagnosis Date  . Esophageal stenosis     Stricture after fundoplication REDO  . Tinnitus     Subjective  . Mitral valve prolapse   . GERD (gastroesophageal reflux disease)   . OSA (obstructive sleep apnea)     on CPAP-Mild - Sleep study 2002/07/28  . Pulmonary embolus ([place])     07/27/08 and 12-2013  . Chronic chest pain   . Shortness of breath     with exertion  . Hypertension   . IBS (irritable bowel syndrome)   . Anemia   . Colon polyps     inflamed partially ulcerated hyperplastic polyp  . Diverticulosis   . Hemorrhoids   . Zoster 07/27/2012    R flank  . Diabetes ([place]) 09/24/2013  . Dysphagia esophageal phase - chronic after 3 fundoplications 6/73/4193  . Anxiety     Past Surgical History  Procedure Laterality Date  . Brain surgery  07/27/1993    Stem surgery, Arnold Chiari Malformation  . Breast reduction surgery    . Appendectomy    . Abdominal hysterectomy      still has 1 ovary   . Nissen fundoplication      X 3 lab, open x 2 last with mesh  . Cholecystectomy  28-Jul-2006  . Hernia repair      Hital Hernia  . Knee arthroscopy  05/16/2012    Procedure: ARTHROSCOPY KNEE;  Surgeon: Sharmon Revere, MD;  Location: [place];  Service: Orthopedics;  Laterality: Left;  . Colonoscopy    . Esophagogastroduodenoscopy      Social  History   Social History  . Marital Status: Married    Spouse Name: N/A  . Number of Children: 1  . Years of Education: N/A   Occupational History  . [employer]   . 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 Smoking status: Never Smoker   . Smokeless tobacco: Never Used     Comment: never used tobacco  . Alcohol Use: No  . Drug Use: No  . Sexual Activity: Yes    Birth Control/ Protection: None   Other Topics Concern  . Not on file   Social History Narrative   [employer] Graduate - Technical school   Married 07-27-2068 - 2 years divorced; married Jul 27, 1984 - 2.5 years divorced; married 1988-07-27 - 1 year divorced; married '00   1 son - 07-28-70   Sister - Died @ 66 [place]            Medication List       This list is accurate as of: 02/12/15 11:59 PM.  Always use your most recent med list.  ACIPHEX 20 MG tablet  Generic drug:  RABEprazole  TAKE ONE TABLET BY MOUTH TWICE DAILY     albuterol 108 (90 BASE) MCG/ACT inhaler  Commonly known as:  VENTOLIN HFA  Inhale 2 puffs into the lungs every 6 (six) hours as needed for wheezing or shortness of breath.     ALPRAZolam 0.5 MG tablet  Commonly known as:  XANAX  Take 1 tablet (0.5 mg total) by mouth daily as needed for anxiety.     ANUCORT-HC 25 MG suppository  Generic drug:  hydrocortisone  PLACE 1 SUPPOSITORY RECTALLY AT BEDTIME FOR 5-7 NIGHTS AND THEN AS NEEDED     cycloSPORINE 0.05 % ophthalmic emulsion  Commonly known as:  RESTASIS  Place 1 drop into both eyes 2 (two) times daily.     dexamethasone 4 MG tablet  Commonly known as:  DECADRON  Take 2 mg by mouth daily as needed.     hyoscyamine 0.125 MG SL tablet  Commonly known as:  LEVSIN SL  Place 1 tablet (0.125 mg total) under the tongue every 4 (four) hours as needed.     LEXAPRO 10 MG tablet  Generic drug:  escitalopram  Take 1 tablet (10 mg total) by mouth daily.     lidocaine 5 %  Commonly known as:  LIDODERM  Place 1-2 patches onto the skin daily as  needed. Remove & Discard patch within 12 hours or as directed by MD     ONE TOUCH ULTRA TEST test strip  Generic drug:  glucose blood  CHECK BLOOD SUGAR ONCE DAILY     ONETOUCH DELICA LANCETS 78H Misc  Check blood sugar once daily     rivaroxaban 20 MG Tabs tablet  Commonly known as:  XARELTO  Take 1 tablet (20 mg total) by mouth daily with supper.     temazepam 30 MG capsule  Commonly known as:  RESTORIL  Take 1 capsule (30 mg total) by mouth at bedtime as needed for sleep.     triamterene-hydrochlorothiazide 37.5-25 MG tablet  Commonly known as:  MAXZIDE-25  Take 1 tablet by mouth daily.           Objective:   Physical Exam  Constitutional: She is oriented to person, place, and time. She appears well-developed and well-nourished.  Neurological: She is alert and oriented to person, place, and time.  Skin: Skin is warm and dry.     Psychiatric: She has a normal mood and affect. Her behavior is normal. Judgment and thought content normal.   BP 122/66 mmHg  Pulse 77  Temp(Src) 97.8 F (36.6 C) (Oral)  Ht 5\' 8"  (1.727 m)  Wt 253 lb 4 oz (114.873 kg)  BMI 38.52 kg/m2  SpO2 93%     Assessment & Plan:   Assessment > DM HTN Anxiety  Pulmonary: --OSA, sleep study 2004, on a CPAP --Pulmonary emboli  2010 and 12-2013 - saw hematology 12-2014, rec anticoag x 2 years (until 12-2015) -- multiple pulmonary nodule, last CT 11-2013: No further imaging suggested Fibromyalgia CV: --MVP GI: --GERD --Esophageal stenosis --chronic dysphagia s/p  fundoplication 3  -- IBS -- chronic right flank,RUQ pain :  since GB surgery 2008 Etiology is not clear but likely multifactorial including:Adhesions from the surgery, neuropathy as she has diabetes, postherpetic neuralgia, scarring from PE, etc.  MRI T spine done 06-2014: DJD  Zoster --  2014 right flank   Plan: Bruice, right leg: Recommend observation. Left leg injury 10-2014, ultrasound negative for DVT 12/03/2014. Induration  of  the left calf keeps improving however the patient continue c/o  pain. Recommend a use of lidocaine patch. Fatigue: Reports fatigue for at least one year, labs reviewed, no thyroid disease or anemia. Denies anxiety or depression. Good compliance with CPAP. Related to fibromyalgia?. Recommend observation for now   Today, I spent more than  25  min with the patient: >50% of the time counseling regards  Leg pain, reassuring her , also reviewed the chart regards her c/o fatigue

## 2015-02-12 NOTE — Patient Instructions (Signed)
Use the lidocaine patch 12 hours a day at the left calf

## 2015-02-12 NOTE — Progress Notes (Signed)
Pre visit review using our clinic review tool, if applicable. No additional management support is needed unless otherwise documented below in the visit note. 

## 2015-02-13 DIAGNOSIS — Z09 Encounter for follow-up examination after completed treatment for conditions other than malignant neoplasm: Secondary | ICD-10-CM | POA: Insufficient documentation

## 2015-02-13 NOTE — Assessment & Plan Note (Signed)
Bruice, right leg: Recommend observation. Left leg injury 10-2014, ultrasound negative for DVT 12/03/2014. Induration of the left calf keeps improving however the patient continue c/o  pain. Recommend a use of lidocaine patch. Fatigue: Reports fatigue for at least one year, labs reviewed, no thyroid disease or anemia. Denies anxiety or depression. Good compliance with CPAP. Related to fibromyalgia?. Recommend observation for now

## 2015-03-08 ENCOUNTER — Encounter: Payer: Self-pay | Admitting: Internal Medicine

## 2015-03-08 ENCOUNTER — Telehealth: Payer: Self-pay | Admitting: Internal Medicine

## 2015-03-08 ENCOUNTER — Ambulatory Visit (INDEPENDENT_AMBULATORY_CARE_PROVIDER_SITE_OTHER): Payer: Federal, State, Local not specified - PPO | Admitting: Internal Medicine

## 2015-03-08 VITALS — BP 126/76 | HR 69 | Temp 97.7°F | Ht 68.0 in | Wt 254.0 lb

## 2015-03-08 DIAGNOSIS — R946 Abnormal results of thyroid function studies: Secondary | ICD-10-CM | POA: Diagnosis not present

## 2015-03-08 DIAGNOSIS — Z09 Encounter for follow-up examination after completed treatment for conditions other than malignant neoplasm: Secondary | ICD-10-CM

## 2015-03-08 DIAGNOSIS — Z114 Encounter for screening for human immunodeficiency virus [HIV]: Secondary | ICD-10-CM

## 2015-03-08 DIAGNOSIS — I10 Essential (primary) hypertension: Secondary | ICD-10-CM | POA: Diagnosis not present

## 2015-03-08 DIAGNOSIS — E119 Type 2 diabetes mellitus without complications: Secondary | ICD-10-CM

## 2015-03-08 DIAGNOSIS — S8012XD Contusion of left lower leg, subsequent encounter: Secondary | ICD-10-CM

## 2015-03-08 DIAGNOSIS — Z1159 Encounter for screening for other viral diseases: Secondary | ICD-10-CM

## 2015-03-08 DIAGNOSIS — R7989 Other specified abnormal findings of blood chemistry: Secondary | ICD-10-CM

## 2015-03-08 NOTE — Telephone Encounter (Signed)
Relation to [id number] Call back [phone]   Reason for call:   Patient was seen 03/08/15 as per AVS: Next visit for a physical exam in 3 months, fasting. Next physical appointment is not until March please advise if patient can wait.

## 2015-03-08 NOTE — Patient Instructions (Signed)
Get your blood work before you leave      Next visit  for a   physical exam in 3 months, fasting  Please schedule an appointment at the front desk

## 2015-03-08 NOTE — Telephone Encounter (Signed)
Okay to put 2-15 minute appts together.  

## 2015-03-08 NOTE — Progress Notes (Signed)
Subjective:    Patient ID: Jennifer [name], female    DOB: 1950/12/13, 64 y.o.   MRN: [id number]  DOS:  03/08/2015 Type of visit - description :  Routine office visit Interval history: Diabetes: Checks her blood sugars from time to time, usually 110. Leg injury: Still hurting, better? Has seen Dr. Eulas Post, orthopedic surgery before, she was recommend to take Decadron as needed for DJD. (Recommend not to take steroids for DJD)    Review of Systems Denies chest pain or difficulty breathing. No nausea, vomiting, diarrhea or blood in the stools  Past Medical History  Diagnosis Date  . Esophageal stenosis     Stricture after fundoplication REDO  . Tinnitus     Subjective  . Mitral valve prolapse   . GERD (gastroesophageal reflux disease)   . OSA (obstructive sleep apnea)     on CPAP-Mild - Sleep study 08-05-2002  . Pulmonary embolus ([place])     08/04/08 and 12-2013  . Chronic chest pain   . Shortness of breath     with exertion  . Hypertension   . IBS (irritable bowel syndrome)   . Anemia   . Colon polyps     inflamed partially ulcerated hyperplastic polyp  . Diverticulosis   . Hemorrhoids   . Zoster Aug 04, 2012    R flank  . Diabetes ([place]) 09/24/2013  . Dysphagia esophageal phase - chronic after 3 fundoplications [id number]  . Anxiety     Past Surgical History  Procedure Laterality Date  . Brain surgery  04-Aug-1993    Stem surgery, Arnold Chiari Malformation  . Breast reduction surgery    . Appendectomy    . Abdominal hysterectomy      still has 1 ovary   . Nissen fundoplication      X 3 lab, open x 2 last with mesh  . Cholecystectomy  2006-08-05  . Hernia repair      Hital Hernia  . Knee arthroscopy  05/16/2012    Procedure: ARTHROSCOPY KNEE;  Surgeon: Sharmon Revere, MD;  Location: [facility];  Service: Orthopedics;  Laterality: Left;  . Colonoscopy    . Esophagogastroduodenoscopy      Social History   Social History  . Marital Status: Married    Spouse Name: N/A  . Number of Children: 1    . Years of Education: N/A   Occupational History  . [employer]   . 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 Smoking status: Never Smoker   . Smokeless tobacco: Never Used     Comment: never used tobacco  . Alcohol Use: No  . Drug Use: No  . Sexual Activity: Yes    Birth Control/ Protection: None   Other Topics Concern  . Not on file   Social History Narrative   [employer] Graduate - Technical school   Married Aug 04, 2068 - 2 years divorced; married 1984/08/04 - 2.5 years divorced; married 1988/08/04 - 1 year divorced; married '00   1 son - 05-Aug-2070   Sister - Died @ [address] [place]            Medication List       This list is accurate as of: 03/08/15 11:59 PM.  Always use your most recent med list.               ACIPHEX 20 MG tablet  Generic drug:  RABEprazole  TAKE ONE TABLET BY MOUTH TWICE DAILY     albuterol 108 (90  BASE) MCG/ACT inhaler  Commonly known as:  VENTOLIN HFA  Inhale 2 puffs into the lungs every 6 (six) hours as needed for wheezing or shortness of breath.     ALPRAZolam 0.5 MG tablet  Commonly known as:  XANAX  Take 1 tablet (0.5 mg total) by mouth daily as needed for anxiety.     cycloSPORINE 0.05 % ophthalmic emulsion  Commonly known as:  RESTASIS  Place 1 drop into both eyes 2 (two) times daily.     hyoscyamine 0.125 MG SL tablet  Commonly known as:  LEVSIN SL  Place 1 tablet (0.125 mg total) under the tongue every 4 (four) hours as needed.     LEXAPRO 10 MG tablet  Generic drug:  escitalopram  Take 1 tablet (10 mg total) by mouth daily.     lidocaine 5 %  Commonly known as:  LIDODERM  Place 1-2 patches onto the skin daily as needed. Remove & Discard patch within 12 hours or as directed by MD     ONE TOUCH ULTRA TEST test strip  Generic drug:  glucose blood  CHECK BLOOD SUGAR ONCE DAILY     ONETOUCH DELICA LANCETS 99991111 Misc  Check blood sugar once daily     rivaroxaban 20 MG Tabs tablet  Commonly known as:  XARELTO  Take 1 tablet (20 mg total) by mouth daily  with supper.     temazepam 30 MG capsule  Commonly known as:  RESTORIL  Take 1 capsule (30 mg total) by mouth at bedtime as needed for sleep.     triamterene-hydrochlorothiazide 37.5-25 MG tablet  Commonly known as:  MAXZIDE-25  Take 1 tablet by mouth daily.           Objective:   Physical Exam BP 126/76 mmHg  Pulse 69  Temp(Src) 97.7 F (36.5 C) (Oral)  Ht 5\' 8"  (1.727 m)  Wt 254 lb (115.214 kg)  BMI 38.63 kg/m2  SpO2 98% General:   Well developed, well nourished . NAD.  HEENT:  Normocephalic . Face symmetric, atraumatic Lungs:  CTA B Normal respiratory effort, no intercostal retractions, no accessory muscle use. Heart: RRR,  no murmur.  Diabetic feet exam: Pinprick examination is slightly decreased and the right foot distally, no edema, good pedal pulses. Dry skin. MSK: Continue with induration, slightly tender at the posterior aspect of the left leg. Neurologic:  alert & oriented X3.  Speech normal, gait appropriate for age and unassisted Psych--  Cognition and judgment appear intact.  Cooperative with normal attention span and concentration.  Behavior appropriate. No anxious or depressed appearing.      Assessment & Plan:   Assessment > DM, + neuropathy. Foot exam 02-2015 HTN Anxiety  Pulmonary: --OSA, sleep study 2004, on a CPAP --Pulmonary emboli  2010 and 12-2013 - saw hematology 12-2014, rec anticoag x 2 years (until 12-2015) -- multiple pulmonary nodule, last CT 11-2013: No further imaging suggested Fibromyalgia CV: --MVP GI: --GERD --Esophageal stenosis --chronic dysphagia s/p  fundoplication 3  -- IBS -- chronic right flank,RUQ pain :  since GB surgery 2008 Etiology not clear but likely multifactorial --> Adhesions from the surgery, neuropathy (DM), postherpetic neuralgia, scarring from PE, etc.  MRI T spine done 06-2014: DJD  Zoster --  2014 right flank   PLAN  DM: on no medications, check A1c and a microalbumin. HTN: Well-controlled. Left  leg injury: Resolving hematoma at the still has some induration on exam, still hurting, recommend to see her orthopedic doctor for a  second opinion. Continue using lidocaine patch which helped. Primary care: Check a hepatitis C and HIV. RTC 3 months for a physical

## 2015-03-08 NOTE — Progress Notes (Signed)
Pre visit review using our clinic review tool, if applicable. No additional management support is needed unless otherwise documented below in the visit note. 

## 2015-03-09 LAB — TSH: TSH: 3.08 u[IU]/mL (ref 0.35–4.50)

## 2015-03-09 LAB — HIV ANTIBODY (ROUTINE TESTING W REFLEX): HIV 1&2 Ab, 4th Generation: NONREACTIVE

## 2015-03-09 LAB — MICROALBUMIN / CREATININE URINE RATIO
CREATININE, U: 78.1 mg/dL
Microalb Creat Ratio: 0.9 mg/g (ref 0.0–30.0)
Microalb, Ur: <0.7 mg/dL (ref 0.0–1.9)

## 2015-03-09 LAB — HEPATITIS C ANTIBODY: HCV AB: NEGATIVE

## 2015-03-09 LAB — HEMOGLOBIN A1C: HEMOGLOBIN A1C: 6.2 % (ref 4.6–6.5)

## 2015-03-09 NOTE — Telephone Encounter (Signed)
Appointment scheduled for 06/09/15 (Wed) at 1pm

## 2015-03-09 NOTE — Assessment & Plan Note (Signed)
DM: on no medications, check A1c and a microalbumin. HTN: Well-controlled. Left leg injury: Resolving hematoma at the still has some induration on exam, still hurting, recommend to see her orthopedic doctor for a second opinion. Continue using lidocaine patch which helped. Primary care: Check a hepatitis C and HIV. RTC 3 months for a physical

## 2015-03-25 ENCOUNTER — Other Ambulatory Visit: Payer: Self-pay | Admitting: Internal Medicine

## 2015-03-25 NOTE — Telephone Encounter (Signed)
Rx faxed to [facility] pharmacy.  

## 2015-03-25 NOTE — Telephone Encounter (Signed)
Pt is requesting refill on Temazepam.  Last OV: 03/08/2015 Last Fill: 12/24/2014 #30 and 2RF UDS: 07/03/2014 Low risk  Please advise.

## 2015-03-25 NOTE — Telephone Encounter (Signed)
Rx printed, awaiting MD signature.  

## 2015-03-25 NOTE — Telephone Encounter (Signed)
Okay 30 and 3 refills 

## 2015-04-25 HISTORY — PX: CATARACT EXTRACTION: SUR2

## 2015-05-17 ENCOUNTER — Other Ambulatory Visit: Payer: Self-pay | Admitting: Internal Medicine

## 2015-06-08 ENCOUNTER — Encounter: Payer: Self-pay | Admitting: Internal Medicine

## 2015-06-08 ENCOUNTER — Ambulatory Visit (INDEPENDENT_AMBULATORY_CARE_PROVIDER_SITE_OTHER): Payer: Federal, State, Local not specified - PPO | Admitting: Internal Medicine

## 2015-06-08 VITALS — BP 122/74 | HR 79 | Temp 98.2°F | Ht 68.0 in | Wt 258.4 lb

## 2015-06-08 DIAGNOSIS — Z09 Encounter for follow-up examination after completed treatment for conditions other than malignant neoplasm: Secondary | ICD-10-CM

## 2015-06-08 DIAGNOSIS — B349 Viral infection, unspecified: Secondary | ICD-10-CM

## 2015-06-08 DIAGNOSIS — R509 Fever, unspecified: Secondary | ICD-10-CM

## 2015-06-08 LAB — POCT INFLUENZA A/B
INFLUENZA A, POC: NEGATIVE
INFLUENZA B, POC: NEGATIVE

## 2015-06-08 LAB — POCT RAPID STREP A (OFFICE): RAPID STREP A SCREEN: NEGATIVE

## 2015-06-08 MED ORDER — BENZONATATE 200 MG PO CAPS: 200.0000 mg | ORAL_CAPSULE | Freq: Two times a day (BID) | ORAL | Status: DC | PRN

## 2015-06-08 MED ORDER — ALBUTEROL SULFATE HFA 108 (90 BASE) MCG/ACT IN AERS: 2.0000 | INHALATION_SPRAY | Freq: Four times a day (QID) | RESPIRATORY_TRACT | Status: DC | PRN

## 2015-06-08 NOTE — Progress Notes (Signed)
Pre visit review using our clinic review tool, if applicable. No additional management support is needed unless otherwise documented below in the visit note. 

## 2015-06-08 NOTE — Progress Notes (Signed)
Subjective:    Patient ID: Jennifer [name], female    DOB: 1950/12/06, 65 y.o.   MRN: [id number]  DOS:  06/08/2015 Type of visit - description : Acute visit Interval history:  Symptoms started a week ago with hurting all over, cough, chills, ear ache, sore throat. + Anterior chest pain and upper abdominal pain with cough.   Review of Systems No fever but taking Tylenol, + chills. No sinus pain, congestion or nasal discharge No nausea vomiting or diarrhea. + Mild, not severe headache   Past Medical History  Diagnosis Date  . Esophageal stenosis     Stricture after fundoplication REDO  . Tinnitus     Subjective  . Mitral valve prolapse   . GERD (gastroesophageal reflux disease)   . OSA (obstructive sleep apnea)     on CPAP-Mild - Sleep study 2002-08-05  . Pulmonary embolus ([place])     08/04/08 and 12-2013  . Chronic chest pain   . Shortness of breath     with exertion  . Hypertension   . IBS (irritable bowel syndrome)   . Anemia   . Colon polyps     inflamed partially ulcerated hyperplastic polyp  . Diverticulosis   . Hemorrhoids   . Zoster August 04, 2012    R flank  . Diabetes ([place]) 09/24/2013  . Dysphagia esophageal phase - chronic after 3 fundoplications [id number]  . Anxiety     Past Surgical History  Procedure Laterality Date  . Brain surgery  August 04, 1993    Stem surgery, Arnold Chiari Malformation  . Breast reduction surgery    . Appendectomy    . Abdominal hysterectomy      still has 1 ovary   . Nissen fundoplication      X 3 lab, open x 2 last with mesh  . Cholecystectomy  08/05/06  . Hernia repair      Hital Hernia  . Knee arthroscopy  05/16/2012    Procedure: ARTHROSCOPY KNEE;  Surgeon: Sharmon Revere, MD;  Location: [facility];  Service: Orthopedics;  Laterality: Left;  . Colonoscopy    . Esophagogastroduodenoscopy      Social History   Social History  . Marital Status: Married    Spouse Name: N/A  . Number of Children: 1  . Years of Education: N/A   Occupational History  .  [employer]   . 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 Smoking status: Never Smoker   . Smokeless tobacco: Never Used     Comment: never used tobacco  . Alcohol Use: No  . Drug Use: No  . Sexual Activity: Yes    Birth Control/ Protection: None   Other Topics Concern  . Not on file   Social History Narrative   [employer] Graduate - Technical school   Married 08-04-68 - 2 years divorced; married 08/04/84 - 2.5 years divorced; married 08/04/1988 - 1 year divorced; married '00   1 son - Aug 05, 2070   Sister - Died @ 41 [place]            Medication List       This list is accurate as of: 06/08/15 11:59 PM.  Always use your most recent med list.               ACIPHEX 20 MG tablet  Generic drug:  RABEprazole  TAKE ONE TABLET BY MOUTH TWICE DAILY     albuterol 108 (90 Base) MCG/ACT inhaler  Commonly known as:  VENTOLIN HFA  Inhale 2 puffs into the lungs every 6 (six) hours as needed for wheezing or shortness of breath.     ALPRAZolam 0.5 MG tablet  Commonly known as:  XANAX  Take 1 tablet (0.5 mg total) by mouth daily as needed for anxiety.     benzonatate 200 MG capsule  Commonly known as:  TESSALON  Take 1 capsule (200 mg total) by mouth 2 (two) times daily as needed for cough.     cycloSPORINE 0.05 % ophthalmic emulsion  Commonly known as:  RESTASIS  Place 1 drop into both eyes 2 (two) times daily.     hyoscyamine 0.125 MG SL tablet  Commonly known as:  LEVSIN SL  Place 1 tablet (0.125 mg total) under the tongue every 4 (four) hours as needed.     LEXAPRO 10 MG tablet  Generic drug:  escitalopram  Take 1 tablet (10 mg total) by mouth daily.     lidocaine 5 %  Commonly known as:  LIDODERM  Place 1-2 patches onto the skin daily as needed. Remove & Discard patch within 12 hours or as directed by MD     ONE TOUCH ULTRA TEST test strip  Generic drug:  glucose blood  CHECK BLOOD SUGAR ONCE DAILY     ONETOUCH DELICA LANCETS 99991111 Misc  Check blood sugar once daily     rivaroxaban 20  MG Tabs tablet  Commonly known as:  XARELTO  Take 1 tablet (20 mg total) by mouth daily with supper.     temazepam 30 MG capsule  Commonly known as:  RESTORIL  Take 1 capsule (30 mg total) by mouth at bedtime as needed for sleep.     triamterene-hydrochlorothiazide 37.5-25 MG tablet  Commonly known as:  MAXZIDE-25  Take 1 tablet by mouth daily.           Objective:   Physical Exam BP 122/74 mmHg  Pulse 79  Temp(Src) 98.2 F (36.8 C) (Oral)  Ht 5\' 8"  (1.727 m)  Wt 258 lb 6 oz (117.198 kg)  BMI 39.29 kg/m2  SpO2 94% General:   Well developed, well nourished . NAD. Frequent cough noted HEENT:  Normocephalic . Face symmetric, atraumatic. Nose is slightly congested, sinuses no TTP, TMs not red, throat symmetric Lungs:  Few end expiratory wheezing, no crackles. Normal respiratory effort, no intercostal retractions, no accessory muscle use. Heart: RRR,  no murmur.  no pretibial edema bilaterally  Abdomen:  Not distended, soft, slightly tender at the upper abdomen without mass or rebound Skin: Not pale. Not jaundice Neurologic:  alert & oriented X3.  Speech normal, gait appropriate for age and unassisted Psych--  Cognition and judgment appear intact.  Cooperative with normal attention span and concentration.  Behavior appropriate. No anxious or depressed appearing.    Assessment & Plan:   Assessment > DM, + neuropathy. Foot exam 02-2015 HTN Anxiety  Pulmonary: --OSA, sleep study 2004, on a CPAP --Pulmonary emboli  2010 and 12-2013 - saw hematology 12-2014, rec anticoag x 2 years (until 12-2015) -- multiple pulmonary nodule, last CT 11-2013: No further imaging suggested Fibromyalgia CV: --MVP GI: --GERD --Esophageal stenosis --chronic dysphagia s/p  fundoplication 3  -- IBS -- chronic right flank,RUQ pain :  since GB surgery 2008 Etiology not clear but likely multifactorial --> Adhesions from the surgery, neuropathy (DM), postherpetic neuralgia, scarring from  PE, etc.  MRI T spine done 06-2014: DJD  Zoster --  2014 right flank   PLAN  Viral syndrome  for a week: Strep and flu  test negative. He has a significant amount of cough and some wheezing. Was taking Mucinex DM but "make the cough worse". She is allergic to morphine and related medications (but has taken hydromet without problems apparently). Plan: Rest, fluids, low dose Robitussin-DM, Tessalon Perles,call if not improving soon. See instructions also recommend albuterol, prescription sent

## 2015-06-08 NOTE — Patient Instructions (Signed)
Rest, fluids , tylenol  For cough:  Take Robitussin-DM 3 times a day as needed until better If the cough continue, take Tessalon Perles  If  nasal congestion: Use OTC Nasocort or Flonase : 2 nasal sprays on each side of the nose in the morning until you feel better   Avoid decongestants such as  Pseudoephedrine or phenylephrine     Call if not gradually better over the next  10 days  Call anytime if the symptoms are severe, you have high fever, short of breath, chest pain

## 2015-06-09 ENCOUNTER — Ambulatory Visit: Payer: Federal, State, Local not specified - PPO | Admitting: Internal Medicine

## 2015-06-09 NOTE — Assessment & Plan Note (Signed)
Viral syndrome for a week: Strep and flu  test negative. He has a significant amount of cough and some wheezing. Was taking Mucinex DM but "make the cough worse". She is allergic to morphine and related medications (but has taken hydromet without problems apparently). Plan: Rest, fluids, low dose Robitussin-DM, Tessalon Perles,call if not improving soon. See instructions also recommend albuterol, prescription sent

## 2015-06-11 ENCOUNTER — Ambulatory Visit: Payer: Federal, State, Local not specified - PPO | Admitting: Internal Medicine

## 2015-06-11 ENCOUNTER — Telehealth: Payer: Self-pay | Admitting: Internal Medicine

## 2015-06-11 NOTE — Telephone Encounter (Signed)
Patient cancelled her 1:15pm patient is not feeling well patient was last seen 06/08/2015, charge or no charge

## 2015-06-11 NOTE — Telephone Encounter (Signed)
Patient [facility] her appointment due to her not feeling well to 06/16/2015 at 2:30pm just an [place]

## 2015-06-11 NOTE — Telephone Encounter (Signed)
No charge. 

## 2015-06-11 NOTE — Telephone Encounter (Signed)
Noted  

## 2015-06-16 ENCOUNTER — Encounter: Payer: Federal, State, Local not specified - PPO | Admitting: Internal Medicine

## 2015-06-16 ENCOUNTER — Telehealth: Payer: Self-pay | Admitting: Internal Medicine

## 2015-06-21 ENCOUNTER — Encounter: Payer: Self-pay | Admitting: Internal Medicine

## 2015-06-21 NOTE — Telephone Encounter (Signed)
Pt was no show 06/16/15 2:30pm for cpe, pt has not rescheduled, charge or no charge?

## 2015-06-21 NOTE — Telephone Encounter (Signed)
Marked to charge and mailing no show letter °

## 2015-06-21 NOTE — Telephone Encounter (Signed)
Charge. 

## 2015-07-05 ENCOUNTER — Other Ambulatory Visit (HOSPITAL_BASED_OUTPATIENT_CLINIC_OR_DEPARTMENT_OTHER): Payer: Federal, State, Local not specified - PPO

## 2015-07-05 ENCOUNTER — Ambulatory Visit (HOSPITAL_BASED_OUTPATIENT_CLINIC_OR_DEPARTMENT_OTHER): Payer: Federal, State, Local not specified - PPO | Admitting: Hematology & Oncology

## 2015-07-05 ENCOUNTER — Encounter: Payer: Self-pay | Admitting: Hematology & Oncology

## 2015-07-05 VITALS — BP 125/72 | HR 68 | Temp 98.0°F | Resp 16 | Ht 68.0 in | Wt 255.0 lb

## 2015-07-05 DIAGNOSIS — Z86711 Personal history of pulmonary embolism: Secondary | ICD-10-CM

## 2015-07-05 DIAGNOSIS — Z0000 Encounter for general adult medical examination without abnormal findings: Secondary | ICD-10-CM

## 2015-07-05 DIAGNOSIS — I2699 Other pulmonary embolism without acute cor pulmonale: Secondary | ICD-10-CM

## 2015-07-05 LAB — CBC WITH DIFFERENTIAL (CANCER CENTER ONLY)
BASO#: 0 10*3/uL (ref 0.0–0.2)
BASO%: 0.5 % (ref 0.0–2.0)
EOS%: 5.1 % (ref 0.0–7.0)
Eosinophils Absolute: 0.2 10*3/uL (ref 0.0–0.5)
HCT: 37.6 % (ref 34.8–46.6)
HGB: 12.6 g/dL (ref 11.6–15.9)
LYMPH#: 1.8 10*3/uL (ref 0.9–3.3)
LYMPH%: 48 % (ref 14.0–48.0)
MCH: 28.8 pg (ref 26.0–34.0)
MCHC: 33.5 g/dL (ref 32.0–36.0)
MCV: 86 fL (ref 81–101)
MONO#: 0.5 10*3/uL (ref 0.1–0.9)
MONO%: 12.5 % (ref 0.0–13.0)
NEUT#: 1.3 10*3/uL — ABNORMAL LOW (ref 1.5–6.5)
NEUT%: 33.9 % — AB (ref 39.6–80.0)
PLATELETS: 210 10*3/uL (ref 145–400)
RBC: 4.38 10*6/uL (ref 3.70–5.32)
RDW: 14.8 % (ref 11.1–15.7)
WBC: 3.7 10*3/uL — ABNORMAL LOW (ref 3.9–10.0)

## 2015-07-05 LAB — COMPREHENSIVE METABOLIC PANEL (CC13)
ALT: 13 IU/L (ref 0–32)
AST: 18 IU/L (ref 0–40)
Albumin, Serum: 3.9 g/dL (ref 3.6–4.8)
Albumin/Globulin Ratio: 1.2 (ref 1.2–2.2)
Alkaline Phosphatase, S: 52 IU/L (ref 39–117)
BUN/Creatinine Ratio: 16 (ref 11–26)
BUN: 14 mg/dL (ref 8–27)
Bilirubin Total: 0.3 mg/dL (ref 0.0–1.2)
CREATININE: 0.9 mg/dL (ref 0.57–1.00)
Calcium, Ser: 8.8 mg/dL (ref 8.7–10.3)
Carbon Dioxide, Total: 30 mmol/L — ABNORMAL HIGH (ref 18–29)
Chloride, Ser: 103 mmol/L (ref 96–106)
GFR calc Af Amer: 79 mL/min/{1.73_m2} (ref >59)
GFR calc non Af Amer: 69 mL/min/{1.73_m2} (ref >59)
GLOBULIN, TOTAL: 3.2 g/dL (ref 1.5–4.5)
GLUCOSE: 97 mg/dL (ref 65–99)
Potassium, Ser: 3.7 mmol/L (ref 3.5–5.2)
Sodium: 137 mmol/L (ref 134–144)
TOTAL PROTEIN: 7.1 g/dL (ref 6.0–8.5)

## 2015-07-05 NOTE — Progress Notes (Signed)
Hematology and Oncology Follow Up Visit  Jennifer [name] [id number] 1951-02-06 65 y.o. 07/05/2015   Principle Diagnosis:   Recurrent pulmonary embolism  Current Therapy:   Xarelto 20 mg by mouth daily - to finish 12/2015    Interim History:  [name] is back for follow-up. She's doing pretty well. Unfortunate, her husband had a myocardial infarction a month ago. He had open-heart surgery.  Her son has myocarditis. He is going to [facility] to be seen.  She's had no positive with chest wall pain. She's had no nausea or vomiting. She's had no bleeding. She has occasional blood per rectum when she is constipated.  She's had no fever. She's had no nausea or vomiting.  She has been on Xarelto for a 1-1/2 years.. We will finish up the Xarelto in September 2017.  Overall, her former status is ECOG 0. She is still working. She may retire next year.    Medications:  Current outpatient prescriptions:  .  ACIPHEX 20 MG tablet, TAKE ONE TABLET BY MOUTH TWICE DAILY, Disp: 60 tablet, Rfl: 0 .  albuterol (VENTOLIN HFA) 108 (90 Base) MCG/ACT inhaler, Inhale 2 puffs into the lungs every 6 (six) hours as needed for wheezing or shortness of breath., Disp: 1 Inhaler, Rfl: 3 .  ALPRAZolam (XANAX) 0.5 MG tablet, Take 1 tablet (0.5 mg total) by mouth daily as needed for anxiety., Disp: 21 tablet, Rfl: 0 .  benzonatate (TESSALON) 200 MG capsule, Take 1 capsule (200 mg total) by mouth 2 (two) times daily as needed for cough., Disp: 20 capsule, Rfl: 0 .  cycloSPORINE (RESTASIS) 0.05 % ophthalmic emulsion, Place 1 drop into both eyes 2 (two) times daily. , Disp: , Rfl:  .  doxycycline (VIBRAMYCIN) 100 MG capsule, Take 100 mg by mouth daily., Disp: , Rfl:  .  hyoscyamine (LEVSIN SL) 0.125 MG SL tablet, Place 1 tablet (0.125 mg total) under the tongue every 4 (four) hours as needed., Disp: 30 tablet, Rfl: 2 .  LEXAPRO 10 MG tablet, Take 1 tablet (10 mg total) by mouth daily., Disp: 30 tablet, Rfl: 5 .   lidocaine (LIDODERM) 5 %, Place 1-2 patches onto the skin daily as needed. Remove & Discard patch within 12 hours or as directed by MD, Disp: 30 patch, Rfl: 1 .  ONE TOUCH ULTRA TEST test strip, CHECK BLOOD SUGAR ONCE DAILY, Disp: 100 each, Rfl: 12 .  ONETOUCH DELICA LANCETS 99991111 MISC, Check blood sugar once daily, Disp: 100 each, Rfl: 12 .  rivaroxaban (XARELTO) 20 MG TABS tablet, Take 1 tablet (20 mg total) by mouth daily with supper., Disp: 30 tablet, Rfl: 6 .  temazepam (RESTORIL) 30 MG capsule, Take 1 capsule (30 mg total) by mouth at bedtime as needed for sleep., Disp: 30 capsule, Rfl: 3 .  triamterene-hydrochlorothiazide (MAXZIDE-25) 37.5-25 MG tablet, Take 1 tablet by mouth daily., Disp: 30 tablet, Rfl: 6  Allergies:  Allergies  Allergen Reactions  . Morphine And Related Anaphylaxis and Shortness Of Breath  . Promethazine Hcl Other (See Comments)    Hallucinations  . Sulfonamide Derivatives Hives    Past Medical History, Surgical history, Social history, and Family History were reviewed and updated.  Review of Systems: As above  Physical Exam:  height is 5\' 8"  (1.727 m) and weight is 255 lb (115.667 kg). Her oral temperature is 98 F (36.7 C). Her blood pressure is 125/72 and her pulse is 68. Her respiration is 16.   Well-developed and well-nourished African-American female.  Head and neck exam shows no ocular or oral lesions. She has no palpable cervical or supraclavicular lymph nodes. Lungs are clear. Cardiac exam regular rate and rhythm with no murmurs, rubs or bruits. Abdomen is soft. She has good bowel sounds. There is no fluid wave. There is no palpable liver or spleen tip. Back exam shows no tenderness over the spine, ribs or hips. Extremities shows no clubbing, cyanosis or edema. No venous cord is noted in the legs. Neurological exam is nonfocal. Skin exam shows no rashes, ecchymoses or petechia.  Lab Results  Component Value Date   WBC 3.7* 07/05/2015   HGB 12.6  07/05/2015   HCT 37.6 07/05/2015   MCV 86 07/05/2015   PLT 210 07/05/2015     Chemistry      Component Value Date/Time   NA 143 01/04/2015 1440   NA 142 08/03/2014 1203   K 3.7 01/04/2015 1440   K 3.4 08/03/2014 1203   CL 103 01/04/2015 1440   CL 108 08/03/2014 1203   CO2 31 01/04/2015 1440   CO2 27 08/03/2014 1203   BUN 18 01/04/2015 1440   BUN 15 08/03/2014 1203   CREATININE 0.90 01/04/2015 1440   CREATININE 0.9 08/03/2014 1203      Component Value Date/Time   CALCIUM 8.9 01/04/2015 1440   CALCIUM 8.5 08/03/2014 1203   ALKPHOS 51 01/04/2015 1440   ALKPHOS 63 08/03/2014 1203   AST 18 01/04/2015 1440   AST 22 08/03/2014 1203   ALT 16 01/04/2015 1440   ALT 19 08/03/2014 1203   BILITOT 0.4 01/04/2015 1440   BILITOT 0.60 08/03/2014 1203         Impression and Plan: Jennifer [name] is 65 year old African-American female. She has history of recurrent pulmonary emboli. She is idiopathic from her hypercoagulable studies.  I'll plan to get her back in 6 months. At that point, I probably will get her on aspirin. I think low-dose aspirin would be reasonable for her.  We will certainly pray for her husband and her son for good health and recovery. She is thankful for the prayers.   [name], MD 3/13/20173:26 PM

## 2015-07-06 ENCOUNTER — Telehealth: Payer: Self-pay | Admitting: *Deleted

## 2015-07-06 LAB — D-DIMER, QUANTITATIVE: D-DIMER: 0.41 mg/L FEU (ref 0.00–0.49)

## 2015-07-06 NOTE — Telephone Encounter (Signed)
Received PA request for Temazepam; initiated via Cover my Meds, awaiting response/SLS 03/14

## 2015-07-06 NOTE — Telephone Encounter (Signed)
Received Approval letter valid 05/07/15 through 07/05/16; forwarded to pharmacy/SLS 03/14

## 2015-07-26 ENCOUNTER — Other Ambulatory Visit: Payer: Self-pay | Admitting: Internal Medicine

## 2015-07-26 NOTE — Telephone Encounter (Signed)
Pt is requesting refill on Temazepam.  Last OV: 06/08/2015 Last Fill: 03/25/2015 #30 and 3RF UDS: 07/03/2014 Low risk  Please advise.

## 2015-07-26 NOTE — Telephone Encounter (Signed)
Rx faxed to [facility].

## 2015-07-26 NOTE — Telephone Encounter (Signed)
Okay #30, no refills. Due for a routine checkup, schedule an appointment

## 2015-07-26 NOTE — Telephone Encounter (Signed)
Rx printed, awaiting MD signature.  

## 2015-08-23 ENCOUNTER — Other Ambulatory Visit: Payer: Self-pay | Admitting: Internal Medicine

## 2015-08-23 NOTE — Telephone Encounter (Signed)
Last OV: 06/08/15 Last filled: 07/26/15, #30, 0 RF Sig: TAKE ONE CAPSULE BY MOUTH ONCE DAILY AT BEDTIME AS NEEDED FOR SLEEP UDS: 07/03/14, low risk

## 2015-08-24 NOTE — Telephone Encounter (Signed)
Rx printed and faxed to the pharmacy.  Confirmation received.//AB/CMA

## 2015-08-24 NOTE — Telephone Encounter (Signed)
Prescription printed #30 and 3 refills

## 2015-08-25 ENCOUNTER — Other Ambulatory Visit: Payer: Self-pay

## 2015-08-25 DIAGNOSIS — Z1231 Encounter for screening mammogram for malignant neoplasm of breast: Secondary | ICD-10-CM

## 2015-08-26 ENCOUNTER — Ambulatory Visit
Admission: RE | Admit: 2015-08-26 | Discharge: 2015-08-26 | Disposition: A | Discharge status: Home / Self Care | Payer: Federal, State, Local not specified - PPO | Source: Ambulatory Visit

## 2015-08-26 DIAGNOSIS — Z1231 Encounter for screening mammogram for malignant neoplasm of breast: Secondary | ICD-10-CM

## 2015-08-30 ENCOUNTER — Other Ambulatory Visit: Payer: Self-pay | Admitting: Internal Medicine

## 2015-08-30 DIAGNOSIS — R928 Other abnormal and inconclusive findings on diagnostic imaging of breast: Secondary | ICD-10-CM

## 2015-09-04 ENCOUNTER — Other Ambulatory Visit: Payer: Self-pay | Admitting: Internal Medicine

## 2015-09-07 ENCOUNTER — Telehealth: Payer: Self-pay | Admitting: *Deleted

## 2015-09-07 ENCOUNTER — Ambulatory Visit
Admission: RE | Admit: 2015-09-07 | Discharge: 2015-09-07 | Disposition: A | Discharge status: Home / Self Care | Payer: Federal, State, Local not specified - PPO | Source: Ambulatory Visit | Attending: Internal Medicine | Admitting: Internal Medicine

## 2015-09-07 DIAGNOSIS — R928 Other abnormal and inconclusive findings on diagnostic imaging of breast: Secondary | ICD-10-CM

## 2015-09-07 NOTE — Telephone Encounter (Signed)
Received via mail. Forwarded to Dr. Gaynelle Arabian. JG//CMA

## 2015-09-25 ENCOUNTER — Other Ambulatory Visit: Payer: Self-pay | Admitting: Internal Medicine

## 2015-10-23 ENCOUNTER — Other Ambulatory Visit: Payer: Self-pay | Admitting: Internal Medicine

## 2015-10-25 NOTE — Telephone Encounter (Signed)
Rx faxed to pharmacy/SLS 07/03 

## 2015-10-29 NOTE — Telephone Encounter (Signed)
In PCP red folder for review.  

## 2015-11-01 ENCOUNTER — Ambulatory Visit (INDEPENDENT_AMBULATORY_CARE_PROVIDER_SITE_OTHER): Payer: Federal, State, Local not specified - PPO | Admitting: Internal Medicine

## 2015-11-01 ENCOUNTER — Encounter: Payer: Self-pay | Admitting: Internal Medicine

## 2015-11-01 VITALS — BP 122/62 | HR 66 | Temp 98.1°F | Ht 68.0 in | Wt 255.0 lb

## 2015-11-01 DIAGNOSIS — Z09 Encounter for follow-up examination after completed treatment for conditions other than malignant neoplasm: Secondary | ICD-10-CM | POA: Diagnosis not present

## 2015-11-01 DIAGNOSIS — K625 Hemorrhage of anus and rectum: Secondary | ICD-10-CM

## 2015-11-01 DIAGNOSIS — Z1211 Encounter for screening for malignant neoplasm of colon: Secondary | ICD-10-CM | POA: Diagnosis not present

## 2015-11-01 DIAGNOSIS — Z9889 Other specified postprocedural states: Secondary | ICD-10-CM | POA: Diagnosis not present

## 2015-11-01 NOTE — Telephone Encounter (Signed)
done

## 2015-11-01 NOTE — Patient Instructions (Addendum)
Metamucil  1 or 2 capsules daily with breakfast, take it with lots of fluids  OTC hydrocortisone 1% cream or OTC  Nupercainal ointment as needed for discomfort-itching  Call or go to the ER if severe-persistent bleeding.

## 2015-11-01 NOTE — Assessment & Plan Note (Signed)
Rectal Bleeding: Likely due to internal hemorrhoids, will treat conservatively. See instructions. She is on Xarelto, I don't believe she is having worrisome blood loss but to be sure will check a CBC. She is actually due for a colonoscopy, she got a letter from GI Dr Alyson Locket, her local GI is Dr Carlean Purl, refer to him Also, records from her orthopedic doctor, Dr. Eulas Post reviewed --- she receive Decadron 2 mg daily for 3 days for flareups of right flank pain. Pt request a RF,will handle her request for steroids on case by case basis. Recommend to avoid excessive for steroids. RTC 11/19/2015 as scheduled

## 2015-11-01 NOTE — Progress Notes (Signed)
Subjective:    Patient ID: Jennifer [name], female    DOB: 1950/08/14, 65 y.o.   MRN: [id number]  DOS:  11/01/2015 Type of visit - description : Acute visit Interval history:  History of rectal bleeding felt to be due to hemorrhoids, here lately symptoms have been essentially every day with a bowel movement, sometimes as much as 2 or 3 teaspoons of blood are noted  Also, I review extensive notes from Dr. Eulas Post, orthopedic surgeon.  --She was seen with right knee pain, had physical therapy in 08/09/2012 -- Had a hematoma, left calf in 08/10/14, ultrasound negative for DVT 11-2014.  Review of Systems Denies any abdominal pain Mild rectal pain with no itching.   Past Medical History  Diagnosis Date  . Esophageal stenosis     Stricture after fundoplication REDO  . Tinnitus     Subjective  . Mitral valve prolapse   . GERD (gastroesophageal reflux disease)   . OSA (obstructive sleep apnea)     on CPAP-Mild - Sleep study 08-10-2002  . Pulmonary embolus ([place])     08-09-2008 and 12-2013  . Chronic chest pain   . Shortness of breath     with exertion  . Hypertension   . IBS (irritable bowel syndrome)   . Anemia   . Colon polyps     inflamed partially ulcerated hyperplastic polyp  . Diverticulosis   . Hemorrhoids   . Zoster 2012-08-09    R flank  . Diabetes ([place]) 09/24/2013  . Dysphagia esophageal phase - chronic after 3 fundoplications [id number]  . Anxiety     Past Surgical History  Procedure Laterality Date  . Brain surgery  August 09, 1993    Stem surgery, Arnold Chiari Malformation  . Breast reduction surgery    . Appendectomy    . Abdominal hysterectomy      still has 1 ovary   . Nissen fundoplication      X 3 lab, open x 2 last with mesh  . Cholecystectomy  2006/08/10  . Hernia repair      Hital Hernia  . Knee arthroscopy  05/16/2012    Procedure: ARTHROSCOPY KNEE;  Surgeon: Sharmon Revere, MD;  Location: [facility];  Service: Orthopedics;  Laterality: Left;  . Colonoscopy    . Esophagogastroduodenoscopy    .  Eye surgery Bilateral     cataracts, Dr. Herbert Deaner    Social History   Social History  . Marital Status: Married    Spouse Name: N/A  . Number of Children: 1  . Years of Education: N/A   Occupational History  . [employer]   . TRAINING [employer]    Social History Main Topics  . Smoking status: Never Smoker   . Smokeless tobacco: Never Used     Comment: never used tobacco  . Alcohol Use: No  . Drug Use: No  . Sexual Activity: Yes    Birth Control/ Protection: None   Other Topics Concern  . Not on file   Social History Narrative   [employer] Graduate - Technical school   Married Aug 09, 2068 - 2 years divorced; married 1984-08-09 - 2.5 years divorced; married 08-09-88 - 1 year divorced; married '00   1 son - 08/10/70   Sister - Died @ 20 [place]            Medication List       This list is accurate as of: 11/01/15  4:59 PM.  Always use your most recent med list.  ACIPHEX 20 MG tablet  Generic drug:  RABEprazole  TAKE ONE TABLET BY MOUTH TWICE DAILY     albuterol 108 (90 Base) MCG/ACT inhaler  Commonly known as:  VENTOLIN HFA  Inhale 2 puffs into the lungs every 6 (six) hours as needed for wheezing or shortness of breath.     ALPRAZolam 0.5 MG tablet  Commonly known as:  XANAX  TAKE ONE TABLET BY MOUTH ONCE DAILY AS NEEDED FOR ANXIETY     cycloSPORINE 0.05 % ophthalmic emulsion  Commonly known as:  RESTASIS  Place 1 drop into both eyes 2 (two) times daily.     doxycycline 100 MG capsule  Commonly known as:  VIBRAMYCIN  Take 100 mg by mouth daily. Reported on 11/01/2015     escitalopram 10 MG tablet  Commonly known as:  LEXAPRO  Take 1 tablet (10 mg total) by mouth daily.     hyoscyamine 0.125 MG SL tablet  Commonly known as:  LEVSIN SL  Place 1 tablet (0.125 mg total) under the tongue every 4 (four) hours as needed.     lidocaine 5 %  Commonly known as:  LIDODERM  Place 1-2 patches onto the skin daily as needed. Remove & Discard patch within 12 hours or as directed by MD       ONE TOUCH ULTRA TEST test strip  Generic drug:  glucose blood  CHECK BLOOD SUGAR ONCE DAILY     ONETOUCH DELICA LANCETS 99991111 Misc  Check blood sugar once daily     temazepam 30 MG capsule  Commonly known as:  RESTORIL  TAKE ONE CAPSULE BY MOUTH ONCE DAILY AT BEDTIME AS NEEDED FOR SLEEP     triamterene-hydrochlorothiazide 37.5-25 MG tablet  Commonly known as:  MAXZIDE-25  TAKE ONE TABLET BY MOUTH ONCE DAILY     XARELTO 20 MG Tabs tablet  Generic drug:  rivaroxaban  TAKE ONE TABLET BY MOUTH ONCE DAILY WITH SUPPER           Objective:   Physical Exam BP 122/62 mmHg  Pulse 66  Temp(Src) 98.1 F (36.7 C) (Oral)  Ht 5\' 8"  (1.727 m)  Wt 255 lb (115.667 kg)  BMI 38.78 kg/m2  SpO2 97% General:   Well developed, well nourished . NAD.  HEENT:  Normocephalic . Face symmetric, atraumatic Abdomen: Soft, nontender, nondistended Digital rectal exam: External examination normal, DRE w/ no rectal mass, brown stools. Anoscopy: Few small internal hemorrhoids, currently not bleeding. No excessive discomfort. Skin: Not pale. Not jaundice Neurologic:  alert & oriented X3.  Speech normal, gait appropriate for age and unassisted Psych--  Cognition and judgment appear intact.  Cooperative with normal attention span and concentration.  Behavior appropriate. No anxious or depressed appearing.      Assessment & Plan:   Assessment > DM, + neuropathy. Foot exam 02-2015 HTN Anxiety  Pulmonary: --OSA, sleep study 2004, on a CPAP --Pulmonary emboli  2010 and 12-2013 - saw hematology 12-2014, rec anticoag x 2 years (until 12-2015) -- multiple pulmonary nodule, last CT 11-2013: No further imaging suggested Fibromyalgia CV: --MVP GI: --GERD --Esophageal stenosis --chronic dysphagia s/p  fundoplication 3  -- IBS -- chronic right flank,RUQ pain :  since GB surgery 2008 Etiology not clear but likely multifactorial --> Adhesions from the surgery, neuropathy (DM), postherpetic neuralgia,  scarring from PE, etc.  MRI T spine done 06-2014: DJD  Zoster --  2014 right flank   PLAN Rectal Bleeding: Likely due to internal hemorrhoids, will treat conservatively. See  instructions. She is on Xarelto, I don't believe she is having worrisome blood loss but to be sure will check a CBC. She is actually due for a colonoscopy, she got a letter from GI Dr Alyson Locket, her local GI is Dr Carlean Purl, refer to him Also, records from her orthopedic doctor, Dr. Eulas Post reviewed --- she receive Decadron 2 mg daily for 3 days for flareups of right flank pain. Pt request a RF,will handle her request for steroids on case by case basis. Recommend to avoid excessive for steroids. RTC 11/19/2015 as scheduled

## 2015-11-01 NOTE — Progress Notes (Signed)
Pre visit review using our clinic review tool, if applicable. No additional management support is needed unless otherwise documented below in the visit note. 

## 2015-11-02 ENCOUNTER — Telehealth: Payer: Self-pay | Admitting: Internal Medicine

## 2015-11-02 NOTE — Telephone Encounter (Signed)
Can see APP in August

## 2015-11-02 NOTE — Telephone Encounter (Signed)
Patient has been scheduled for OV with Janett Billow 8/1 at 9:30am.

## 2015-11-19 ENCOUNTER — Ambulatory Visit (INDEPENDENT_AMBULATORY_CARE_PROVIDER_SITE_OTHER): Payer: Federal, State, Local not specified - PPO | Admitting: Internal Medicine

## 2015-11-19 ENCOUNTER — Encounter: Payer: Self-pay | Admitting: Internal Medicine

## 2015-11-19 VITALS — BP 118/64 | HR 89 | Temp 97.8°F | Ht 68.5 in | Wt 254.1 lb

## 2015-11-19 DIAGNOSIS — Z0000 Encounter for general adult medical examination without abnormal findings: Secondary | ICD-10-CM | POA: Diagnosis not present

## 2015-11-19 DIAGNOSIS — Z23 Encounter for immunization: Secondary | ICD-10-CM

## 2015-11-19 DIAGNOSIS — E118 Type 2 diabetes mellitus with unspecified complications: Secondary | ICD-10-CM

## 2015-11-19 LAB — CBC WITH DIFFERENTIAL/PLATELET
BASOS ABS: 0 {cells}/uL (ref 0–200)
BASOS PCT: 0 %
EOS PCT: 3 %
Eosinophils Absolute: 153 cells/uL (ref 15–500)
HCT: 38.1 % (ref 35.0–45.0)
HEMOGLOBIN: 12.6 g/dL (ref 11.7–15.5)
LYMPHS ABS: 2244 {cells}/uL (ref 850–3900)
Lymphocytes Relative: 44 %
MCH: 28.4 pg (ref 27.0–33.0)
MCHC: 33.1 g/dL (ref 32.0–36.0)
MCV: 85.8 fL (ref 80.0–100.0)
MONOS PCT: 9 %
MPV: 9.6 fL (ref 7.5–12.5)
Monocytes Absolute: 459 cells/uL (ref 200–950)
NEUTROS ABS: 2244 {cells}/uL (ref 1500–7800)
Neutrophils Relative %: 44 %
PLATELETS: 260 10*3/uL (ref 140–400)
RBC: 4.44 MIL/uL (ref 3.80–5.10)
RDW: 15.6 % — ABNORMAL HIGH (ref 11.0–15.0)
WBC: 5.1 10*3/uL (ref 3.8–10.8)

## 2015-11-19 LAB — LIPID PANEL
CHOLESTEROL: 183 mg/dL (ref 125–200)
HDL: 73 mg/dL (ref >=46)
LDL CALC: 89 mg/dL (ref <130)
TRIGLYCERIDES: 103 mg/dL (ref <150)
Total CHOL/HDL Ratio: 2.5 Ratio (ref <=5.0)
VLDL: 21 mg/dL (ref <30)

## 2015-11-19 LAB — HEMOGLOBIN A1C
HEMOGLOBIN A1C: 6.1 % — AB (ref <5.7)
MEAN PLASMA GLUCOSE: 128 mg/dL

## 2015-11-19 MED ORDER — ONETOUCH DELICA LANCETS 33G MISC: 12 refills | Status: DC

## 2015-11-19 MED ORDER — GLUCOSE BLOOD VI STRP: ORAL_STRIP | 12 refills | Status: DC

## 2015-11-19 NOTE — Progress Notes (Signed)
Pre visit review using our clinic review tool, if applicable. No additional management support is needed unless otherwise documented below in the visit note. 

## 2015-11-19 NOTE — Assessment & Plan Note (Signed)
Td 2014;  ostavax 2014 ;  Pneumonia shot 2016;  prevnar  Colonoscopy 2009, next 10 years Saw gynecology 2015, was told to return in 5 years Last mammogram 08-2015  Diet and exercise discussed

## 2015-11-19 NOTE — Progress Notes (Signed)
Subjective:    Patient ID: Jennifer [name], female    DOB: 1950-06-12, 65 y.o.   MRN: [id number]  DOS:  11/19/2015 Type of visit - description : CPX Interval history: Was recently seen with rectal bleeding, symptoms are definitely decreased but not completely gone. Will see GI in few days    Review of Systems + Review of systems: Occasional cough, difficulty swallowing and blood in the stools: No new symptoms. Otherwise ROS negative  Constitutional: No fever. No chills. No unexplained wt changes. No unusual sweats  HEENT: No dental problems, no ear discharge, no facial swelling, no voice changes. No eye discharge, no eye  redness , no  intolerance to light   Respiratory: No wheezing , no  difficulty breathing.   Cardiovascular: No CP, no leg swelling , no  Palpitations  GI: no nausea, no vomiting, no diarrhea , no  abdominal pain.  No blood in the stools.   Endocrine: No polyphagia, no polyuria , no polydipsia  GU: No dysuria, gross hematuria, difficulty urinating. No urinary urgency, no frequency.  Musculoskeletal: No joint swellings or unusual aches or pains  Skin: No change in the color of the skin, palor , no  Rash  Allergic, immunologic: No environmental allergies , no  food allergies  Neurological: No dizziness no  syncope. No headaches. No diplopia, no slurred, no slurred speech, no motor deficits, no facial  Numbness  Hematological: No enlarged lymph nodes, no easy bruising , no unusual bleedings  Psychiatry: No suicidal ideas, no hallucinations, no beavior problems, no confusion.  No unusual/severe anxiety, no depression    Past Medical History:  Diagnosis Date  . Anemia   . Anxiety   . Chronic chest pain   . Colon polyps    inflamed partially ulcerated hyperplastic polyp  . Diabetes ([place]) 09/24/2013  . Diverticulosis   . Dysphagia esophageal phase - chronic after 3 fundoplications [id number]  . Esophageal stenosis    Stricture after fundoplication REDO  .  GERD (gastroesophageal reflux disease)   . Hemorrhoids   . Hypertension   . IBS (irritable bowel syndrome)   . Mitral valve prolapse   . OSA (obstructive sleep apnea)    on CPAP-Mild - Sleep study 2004  . Pulmonary embolus ([place])    2010 and 12-2013  . Shortness of breath    with exertion  . Tinnitus    Subjective  . Zoster 2014   R flank    Past Surgical History:  Procedure Laterality Date  . ABDOMINAL HYSTERECTOMY     still has 1 ovary   . APPENDECTOMY    . [place]   Stem surgery, Arnold Chiari Malformation  . BREAST REDUCTION SURGERY    . CATARACT EXTRACTION Bilateral 2017   cataracts, Dr. Herbert Deaner  . CHOLECYSTECTOMY  2008  . COLONOSCOPY    . ESOPHAGOGASTRODUODENOSCOPY    . HERNIA REPAIR     Hital Hernia  . KNEE ARTHROSCOPY  05/16/2012   Procedure: ARTHROSCOPY KNEE;  Surgeon: Sharmon Revere, MD;  Location: [facility];  Service: Orthopedics;  Laterality: Left;  . NISSEN FUNDOPLICATION     X 3 lab, open x 2 last with mesh    Social History   Social History  . Marital status: Married    Spouse name: N/A  . Number of children: 1  . Years of education: N/A   Occupational History  . retired form the [employer] 04-2015 [employer]   Social History Main Topics  .  Smoking status: Never Smoker  . Smokeless tobacco: Never Used     Comment: never used tobacco  . Alcohol use No  . Drug use: No  . Sexual activity: Yes    Birth control/ protection: None   Other Topics Concern  . Not on file   Social History Narrative   [employer] Graduate - Technical school   Married 2068/07/27 - 2 years divorced; married 07-27-1984 - 2.5 years divorced; married 07-27-1988 - 1 year divorced; married '00   1 son - 2070/07/28   Sister - Died @ 59 [place]         Family History  Problem Relation Age of Onset  . Diabetes Mother   . Rheum arthritis Mother   . Hypertension Mother   . Hypertension Father   . Rheum arthritis Sister   . Hypertension Sister   . Colon cancer Maternal Grandmother   . Rheum  arthritis Sister   . Rheum arthritis Sister   . Rheum arthritis Sister   . CAD Sister     MIdx age 49  . Breast cancer Neg Hx        Medication List       Accurate as of 11/19/15 11:59 PM. Always use your most recent med list.          ACIPHEX 20 MG tablet Generic drug:  RABEprazole TAKE ONE TABLET BY MOUTH TWICE DAILY   ALPRAZolam 0.5 MG tablet Commonly known as:  XANAX TAKE ONE TABLET BY MOUTH ONCE DAILY AS NEEDED FOR ANXIETY   cycloSPORINE 0.05 % ophthalmic emulsion Commonly known as:  RESTASIS Place 1 drop into both eyes 2 (two) times daily.   doxycycline 100 MG capsule Commonly known as:  VIBRAMYCIN Take 100 mg by mouth daily. Reported on 11/01/2015   escitalopram 10 MG tablet Commonly known as:  LEXAPRO Take 1 tablet (10 mg total) by mouth daily.   glucose blood test strip Check blood sugar once daily   hyoscyamine 0.125 MG SL tablet Commonly known as:  LEVSIN SL Place 1 tablet (0.125 mg total) under the tongue every 4 (four) hours as needed.   lidocaine 5 % Commonly known as:  LIDODERM Place 1-2 patches onto the skin daily as needed. Remove & Discard patch within 12 hours or as directed by MD   [facility] DELICA LANCETS 99991111 Misc Check blood sugar once daily   temazepam 30 MG capsule Commonly known as:  RESTORIL TAKE ONE CAPSULE BY MOUTH ONCE DAILY AT BEDTIME AS NEEDED FOR SLEEP   triamterene-hydrochlorothiazide 37.5-25 MG tablet Commonly known as:  MAXZIDE-25 TAKE ONE TABLET BY MOUTH ONCE DAILY   XARELTO 20 MG Tabs tablet Generic drug:  rivaroxaban TAKE ONE TABLET BY MOUTH ONCE DAILY WITH SUPPER          Objective:   Physical Exam BP 118/64 (BP Location: Left Arm, Patient Position: Sitting, Cuff Size: Large)   Pulse 89   Temp 97.8 F (36.6 C) (Oral)   Ht 5' 8.5" (1.74 m)   Wt 254 lb 2 oz (115.3 kg)   BMI 38.08 kg/m   General:   Well developed, well nourished . NAD.  Neck: No  thyromegaly  HEENT:  Normocephalic . Face symmetric,  atraumatic Lungs:  CTA B Normal respiratory effort, no intercostal retractions, no accessory muscle use. Heart: RRR,  no murmur.  No pretibial edema bilaterally  Abdomen:  Not distended, soft, non-tender. No rebound or rigidity.   Skin: Exposed areas without rash. Not pale. Not jaundice  Neurologic:  alert & oriented X3.  Speech normal, gait appropriate for age and unassisted Strength symmetric and appropriate for age.  Psych: Cognition and judgment appear intact.  Cooperative with normal attention span and concentration.  Behavior appropriate. No anxious or depressed appearing.    Assessment & Plan:    Assessment > DM, + neuropathy. Foot exam 02-2015 HTN Anxiety  Thyroid nodule: Bx (non neoplastic goiter) 2014, last ultrasound 08/2013 Pulmonary: --OSA, sleep study 2004, on a CPAP --Pulmonary emboli  --2010 and 12-2013 - saw hematology 12-2014, rec anticoag x 2 years (until 12-2015) -- multiple pulmonary nodule, last CT 11-2013: No further imaging suggested Fibromyalgia CV: --MVP --  ectatic R carotid artery, repeat ultrasound 04-2016 GI: --GERD --Esophageal stenosis --chronic dysphagia s/p  fundoplication 3  -- IBS -- chronic right flank,RUQ pain :  since GB surgery 2008 Etiology not clear but likely multifactorial --> Adhesions from the surgery, neuropathy (DM), postherpetic neuralgia, scarring from PE, etc.  MRI T spine done 06-2014: DJD  Zoster --  2014 right flank   PLAN DM: Check A1c. HTN: On diuretics, last BMP satisfactory Anxiety: Controlled; contract and a UDS today due to Xanax and Restoril Rectal bleeding: Decreased, to see GI soon. RTC 6 months

## 2015-11-19 NOTE — Patient Instructions (Signed)
GO TO THE LAB : Get the blood work     GO TO THE FRONT DESK Schedule your next appointment for a  Check up in 6 months   

## 2015-11-21 NOTE — Assessment & Plan Note (Signed)
DM: Check A1c. HTN: On diuretics, last BMP satisfactory Anxiety: Controlled; contract and a UDS today due to Xanax and Restoril Rectal bleeding: Decreased, to see GI soon. RTC 6 months

## 2015-11-23 ENCOUNTER — Telehealth: Payer: Self-pay | Admitting: *Deleted

## 2015-11-23 ENCOUNTER — Other Ambulatory Visit: Payer: Self-pay | Admitting: Family Medicine

## 2015-11-23 ENCOUNTER — Ambulatory Visit (INDEPENDENT_AMBULATORY_CARE_PROVIDER_SITE_OTHER): Payer: Federal, State, Local not specified - PPO | Admitting: Gastroenterology

## 2015-11-23 ENCOUNTER — Encounter: Payer: Self-pay | Admitting: Gastroenterology

## 2015-11-23 ENCOUNTER — Other Ambulatory Visit: Payer: Self-pay | Admitting: Internal Medicine

## 2015-11-23 VITALS — BP 108/70 | HR 72 | Ht 68.5 in | Wt 258.0 lb

## 2015-11-23 DIAGNOSIS — R1013 Epigastric pain: Secondary | ICD-10-CM

## 2015-11-23 DIAGNOSIS — Z7901 Long term (current) use of anticoagulants: Secondary | ICD-10-CM

## 2015-11-23 DIAGNOSIS — Z860100 Personal history of colon polyps, unspecified: Secondary | ICD-10-CM

## 2015-11-23 DIAGNOSIS — K589 Irritable bowel syndrome without diarrhea: Secondary | ICD-10-CM

## 2015-11-23 DIAGNOSIS — R197 Diarrhea, unspecified: Secondary | ICD-10-CM

## 2015-11-23 MED ORDER — SUCRALFATE 1 GM/10ML PO SUSP: 1.0000 g | Freq: Three times a day (TID) | ORAL | 0 refills | Status: DC

## 2015-11-23 MED ORDER — HYOSCYAMINE SULFATE 0.125 MG SL SUBL: 0.1250 mg | SUBLINGUAL_TABLET | SUBLINGUAL | 2 refills | Status: DC | PRN

## 2015-11-23 MED ORDER — NA SULFATE-K SULFATE-MG SULF 17.5-3.13-1.6 GM/177ML PO SOLN: 1.0000 | Freq: Once | ORAL | 0 refills | Status: AC

## 2015-11-23 NOTE — Telephone Encounter (Signed)
  11/23/2015   RE: Jennifer [name] DOB: August 18, 1950 MRN: [id number]   Dear Dr. Burney Gauze,    We have scheduled the above patient for an endoscopic procedure. Our records show that she is on anticoagulation therapy.   Please advise as to how long the patient may come off her therapy of Xarelto prior to the procedure, which is scheduled for 12-14-2015.  Please fax back/ or route the completed form to [facility] at [phone].   Sincerely,    Alonza Bogus PA-C

## 2015-11-23 NOTE — Patient Instructions (Addendum)
We sent prescriptions to [facility]. 1. Carafate liquid 2. Levsin for spasms and abdominal pain.  We will call you once we hear from [name] regarding the Xarelto directions.  You have been scheduled for a colonoscopy. Please follow written instructions given to you at your visit today.  Please pick up your prep supplies at the pharmacy within the next 1-3 days. If you use inhalers (even only as needed), please bring them with you on the day of your procedure. Your physician has requested that you go to www.[email or web address].com and enter the access code given to you at your visit today. This web site gives a general overview about your procedure. However, you should still follow specific instructions given to you by our office regarding your preparation for the procedure.

## 2015-12-02 ENCOUNTER — Encounter: Payer: Self-pay | Admitting: Gastroenterology

## 2015-12-02 DIAGNOSIS — Z86010 Personal history of colonic polyps: Secondary | ICD-10-CM | POA: Insufficient documentation

## 2015-12-02 DIAGNOSIS — R1013 Epigastric pain: Secondary | ICD-10-CM | POA: Insufficient documentation

## 2015-12-02 DIAGNOSIS — R197 Diarrhea, unspecified: Secondary | ICD-10-CM | POA: Insufficient documentation

## 2015-12-02 NOTE — Progress Notes (Signed)
12/02/2015 Jennifer [name] [id number] 17-Feb-1951   History of Present Illness:  This is a 65 year old female who is known to [name].  She follows with him for irritable bowel syndrome with diarrhea. Takes Levsin for abdominal pain/cramping, which does help and she is asking for a refill on that.  She's also had chronic complaints of epigastric/upper abdominal pain and dysphagia since her fundoplication. In 2008.  EGD in October 2015 showed changes status post fundoplication but was otherwise unremarkable.  She continues with these complaints saying that food feels like it gets stuck whenever she eats and that in the morning her stomach hurts as soon as food hits it.  She is on Aciphex 20 mg BID.  She is here today primarily to discuss colonoscopy. She is on Xarelto for history of pulmonary embolism and follows with her PCP, Dr. Larose Kells, and oncology, [name], for this.  She tells me that as far she is aware [name] has plans to possibly permanentl discontinue her Xarelto in the near future.  Her last colonoscopy here was in May 2009 at which time she is found have a 5 mm descending colon polyp that was removed along with diverticulosis and internal hemorrhoids. The polyp was inflamed partially ulcerated hyperplastic polyp but no adenomatous changes were found. She was put in for a 10 year recall. In the interim, she had another colonoscopy by Dr. Alyson Locket at [facility] in July 2012 and apparently had polyps removed at that time. She just received a recall letter in the mail from them stating she was due for another colonoscopy.  We are going to try to obtain those records since we could not access the report in [facility].  She complains of rectal bleeding, says that she has some bright red blood on the TP and in the commode with every BM.  Dr. Larose Kells performed rectal exam and anoscopy where he reported small internal hemorrhoids with no active bleeding.  CBC showed normal/stable Hgb at 12.6  grams.   Current Medications, Allergies, Past Medical History, Past Surgical History, Family History and Social History were reviewed in [facility] record.   Physical Exam: BP 108/70   Pulse 72   Ht 5' 8.5" (1.74 m)   Wt 258 lb (117 kg) Comment: with shoes  BMI 38.66 kg/m  General:  Well developed black female in no acute distress Head: Normocephalic and atraumatic Eyes:  Sclerae anicteric, conjunctiva pink  Ears: Normal auditory acuity Lungs: Clear throughout to auscultation Heart: Regular rate and rhythm Abdomen: Soft, non-distended.  BS present.  Mild epigastric TTP.   Rectal:  Will be at the time of colonoscopy. Musculoskeletal: Symmetrical with no gross deformities  Extremities: No edema  Neurological: Alert oriented x 4, grossly non-focal Psychological:  Alert and cooperative. Normal mood and affect  Assessment and Recommendations: -Personal history of colon polyps:  Colonoscopy here in 2009 was ok but colonoscopy in 10/2010 by Dr. Alyson Locket apparently had polyps for which repeat was recommended in 5 years.  Will try to obtain records but will also schedule colonoscopy tentatively with [name] as well. -Rectal bleeding:  Likely hemorrhoidal.  If continues then may need banding although this may improve/resolve if she is taken off of the Xarelto as she says that she may be in the future. -Chronic anticoagulation:  On Xarelto.  Per patient, [name] may be taking her off of this permanently soon.  For now will hold Xarelto for 1-2 days  prior to endoscopic procedures - will instruct when and how to resume after procedure. Benefits and risks of procedure explained including risks of bleeding, perforation, infection, missed lesions, reactions to medications and possible need for hospitalization and surgery for complications. Additional rare but real risk of stroke or other vascular clotting events off of Xarelto also explained and need to seek urgent help if  any signs of these problems occur. Will communicate by phone or EMR with patient's  prescribing provider, [name], to confirm that holding Xarelto is reasonable in this case.  -IBS with diarrhea:  Stable overall.  Would like a refill on Levsin. -Epigastric pain and dysphagia:  These are chronic, ongoing complaints since her hiatal hernia repair in 2008.  Will give carafate to use for now in addition to her BID PPI to see if this helps.  Discussed changing PPI but she would like to try carafate first.

## 2015-12-04 NOTE — Progress Notes (Signed)
Agree with Ms. Zehr's management.  Milianna Ericsson E. Lisle Skillman, MD, FACG  

## 2015-12-08 ENCOUNTER — Telehealth: Payer: Self-pay | Admitting: Internal Medicine

## 2015-12-08 ENCOUNTER — Telehealth: Payer: Self-pay

## 2015-12-08 ENCOUNTER — Telehealth: Payer: Self-pay | Admitting: *Deleted

## 2015-12-08 ENCOUNTER — Encounter: Payer: Self-pay | Admitting: *Deleted

## 2015-12-08 NOTE — Telephone Encounter (Signed)
11/23/2015   RE: Jennifer [name] DOB: 02-07-1951 MRN: [id number]   Dear Dr. Burney Gauze,    We have scheduled the above patient for an endoscopic procedure. Our records show that she is on anticoagulation therapy.   Please advise as to how long the patient may come off her therapy of Xarelto prior to the procedure, which is scheduled for 12-14-2015.  Please fax back/ or route the completed form to [facility] at [phone].   Sincerely,    Alonza Bogus PA-C

## 2015-12-08 NOTE — Telephone Encounter (Signed)
Called the patient to advise I did get an answer on the Xarelto instructions from [name]. He said she can stop it on 8-21 and restart it on 12-15-2015/ Patient verbalized understanding instructions.

## 2015-12-08 NOTE — Telephone Encounter (Signed)
Spoke to the patient and advised her I just faxed the anticoagulation letter to [place] at Dr. Freddie Apley office. The patient is going to call [facility] to make sure she got our fax.

## 2015-12-08 NOTE — Telephone Encounter (Signed)
UDS: 11/19/2015  Negative for Alprazolam: PRN (rarely Rx'ed) Positive for Temazepam   Low risk per Dr. Larose Kells 12/07/2015

## 2015-12-14 ENCOUNTER — Encounter: Payer: Self-pay | Admitting: Internal Medicine

## 2015-12-14 ENCOUNTER — Ambulatory Visit (AMBULATORY_SURGERY_CENTER): Payer: Federal, State, Local not specified - PPO | Admitting: Internal Medicine

## 2015-12-14 VITALS — BP 128/76 | HR 61 | Temp 97.8°F | Resp 14 | Ht 68.5 in | Wt 258.0 lb

## 2015-12-14 DIAGNOSIS — D123 Benign neoplasm of transverse colon: Secondary | ICD-10-CM | POA: Diagnosis not present

## 2015-12-14 DIAGNOSIS — K588 Other irritable bowel syndrome: Secondary | ICD-10-CM | POA: Diagnosis not present

## 2015-12-14 DIAGNOSIS — D125 Benign neoplasm of sigmoid colon: Secondary | ICD-10-CM | POA: Diagnosis not present

## 2015-12-14 MED ORDER — SODIUM CHLORIDE 0.9 % IV SOLN: 500.0000 mL | INTRAVENOUS | Status: DC

## 2015-12-14 NOTE — Progress Notes (Signed)
Called to room to assist during endoscopic procedure.  Patient ID and intended procedure confirmed with present staff. Received instructions for my participation in the procedure from the performing physician.  

## 2015-12-14 NOTE — Patient Instructions (Addendum)
I found and removed 3 small polyps that look benign. You also have a condition called diverticulosis - common and not usually a problem. Please read the handout provided.   I will let you know pathology results and when to have another routine colonoscopy by mail.  Please resume Xarelto tomorrow.  I appreciate the opportunity to care for you. Gatha Mayer, MD, FACG   YOU HAD AN ENDOSCOPIC PROCEDURE TODAY AT [facility]:   Refer to the procedure report that was given to you for any specific questions about what was found during the examination.  If the procedure report does not answer your questions, please call your gastroenterologist to clarify.  If you requested that your care partner not be given the details of your procedure findings, then the procedure report has been included in a sealed envelope for you to review at your convenience later.  YOU SHOULD EXPECT: Some feelings of bloating in the abdomen. Passage of more gas than usual.  Walking can help get rid of the air that was put into your GI tract during the procedure and reduce the bloating. If you had a lower endoscopy (such as a colonoscopy or flexible sigmoidoscopy) you may notice spotting of blood in your stool or on the toilet paper. If you underwent a bowel prep for your procedure, you may not have a normal bowel movement for a few days.  Please Note:  You might notice some irritation and congestion in your nose or some drainage.  This is from the oxygen used during your procedure.  There is no need for concern and it should clear up in a day or so.  SYMPTOMS TO REPORT IMMEDIATELY:   Following lower endoscopy (colonoscopy or flexible sigmoidoscopy):  Excessive amounts of blood in the stool  Significant tenderness or worsening of abdominal pains  Swelling of the abdomen that is new, acute  Fever of 100F or higher   Following upper endoscopy (EGD)  Vomiting of blood or coffee ground  material  New chest pain or pain under the shoulder blades  Painful or persistently difficult swallowing  New shortness of breath  Fever of 100F or higher  Black, tarry-looking stools  For urgent or emergent issues, a gastroenterologist can be reached at any hour by calling [phone].   DIET:  We do recommend a small meal at first, but then you may proceed to your regular diet.  Drink plenty of fluids but you should avoid alcoholic beverages for 24 hours.  ACTIVITY:  You should plan to take it easy for the rest of today and you should NOT DRIVE or use heavy machinery until tomorrow (because of the sedation medicines used during the test).    FOLLOW UP: Our staff will call the number listed on your records the next business day following your procedure to check on you and address any questions or concerns that you may have regarding the information given to you following your procedure. If we do not reach you, we will leave a message.  However, if you are feeling well and you are not experiencing any problems, there is no need to return our call.  We will assume that you have returned to your regular daily activities without incident.  If any biopsies were taken you will be contacted by phone or by letter within the next 1-3 weeks.  Please call us at [phone] if you have not heard about the biopsies in 3 weeks.  SIGNATURES/CONFIDENTIALITY: You and/or your care partner have signed paperwork which will be entered into your electronic medical record.  These signatures attest to the fact that that the information above on your After Visit Summary has been reviewed and is understood.  Full responsibility of the confidentiality of this discharge information lies with you and/or your care-partner.   Information on polyps and diverticulosis given to you today

## 2015-12-14 NOTE — Progress Notes (Signed)
Patient awakening,vss,report to rn 

## 2015-12-14 NOTE — Op Note (Addendum)
[facility] Patient Name: Jennifer [name] Procedure Date: 12/14/2015 3:39 PM MRN: [id number] Endoscopist: Gatha Mayer , MD Age: 65 Referring MD:  Date of Birth: 1950-05-13 Gender: Female Account #: [id number] Procedure:                Colonoscopy Indications:              Personal history of colonic polyps Medicines:                Propofol per Anesthesia, Monitored Anesthesia Care Procedure:                Pre-Anesthesia Assessment:                           - Prior to the procedure, a History and Physical                            was performed, and patient medications and                            allergies were reviewed. The patient's tolerance of                            previous anesthesia was also reviewed. The risks                            and benefits of the procedure and the sedation                            options and risks were discussed with the patient.                            All questions were answered, and informed consent                            was obtained. Prior Anticoagulants: The patient                            last took Xarelto (rivaroxaban) 2 days prior to the                            procedure. ASA Grade Assessment: III - A patient                            with severe systemic disease. After reviewing the                            risks and benefits, the patient was deemed in                            satisfactory condition to undergo the procedure.                           After obtaining informed consent, the colonoscope  was passed under direct vision. Throughout the                            procedure, the patient's blood pressure, pulse, and                            oxygen saturations were monitored continuously. The                            Model CF-HQ190L [id number] scope was introduced                            through the anus and advanced to the the cecum,                             identified by appendiceal orifice and ileocecal                            valve. The colonoscopy was performed without                            difficulty. The patient tolerated the procedure                            well. The quality of the bowel preparation was                            excellent. The bowel preparation used was SUPREP.                            The ileocecal valve, appendiceal orifice, and                            rectum were photographed. Scope In: 4:01:14 PM Scope Out: 4:17:36 PM Scope Withdrawal Time: 0 hours 11 minutes 41 seconds  Total Procedure Duration: 0 hours 16 minutes 22 seconds  Findings:                 The perianal and digital rectal examinations were                            normal.                           Three sessile polyps were found in the sigmoid                            colon and transverse colon. The polyps were 2 to 6                            mm in size. These polyps were removed with a cold                            snare. Resection and retrieval were complete.  Verification of patient identification for the                            specimen was done. Estimated blood loss was minimal.                           Diverticula were found in the entire colon.                           The exam was otherwise without abnormality on                            direct and retroflexion views. Complications:            No immediate complications. Estimated Blood Loss:     Estimated blood loss was minimal. Impression:               - Three 2 to 6 mm polyps in the sigmoid colon and                            in the transverse colon, removed with a cold snare.                            Resected and retrieved.                           - Mild diverticulosis in the entire examined colon.                           - The examination was otherwise normal on direct                            and retroflexion  views. Recommendation:           - Patient has a contact number available for                            emergencies. The signs and symptoms of potential                            delayed complications were discussed with the                            patient. Return to normal activities tomorrow.                            Written discharge instructions were provided to the                            patient.                           - Resume previous diet.                           - Continue present medications.                           -  Await pathology results.                           - Repeat colonoscopy is recommended for                            surveillance. The colonoscopy date will be                            determined after pathology results from today's                            exam become available for review.                           - Resume Xarelto (rivaroxaban) at prior dose                            tomorrow. Gatha Mayer, MD 12/14/2015 4:24:05 PM This report has been signed electronically. Addendum Number: 1   Addendum Date: 12/14/2015 4:41:26 PM      rectal bleeding must be from hemorrhoids that have been seen on anoscopy.      if this fails to improve adequately after stopping Xarelto can consider       hemorrhoid ligation. Gatha Mayer, MD 12/14/2015 4:42:10 PM This report has been signed electronically.

## 2015-12-15 ENCOUNTER — Telehealth: Payer: Self-pay | Admitting: *Deleted

## 2015-12-15 NOTE — Telephone Encounter (Signed)
  Follow up Call-  Call back number 12/14/2015 01/29/2014  Post procedure Call Back phone  # [phone]  Permission to leave phone message Yes Yes  Some recent data might be hidden     Patient questions:  Do you have a fever, pain , or abdominal swelling? No. Pain Score  0 *  Have you tolerated food without any problems? Yes.    Have you been able to return to your normal activities? Yes.    Do you have any questions about your discharge instructions: Diet   No. Medications  No. Follow up visit  No.  Do you have questions or concerns about your Care? No.  Actions: * If pain score is 4 or above: No action needed, pain <4.

## 2015-12-20 ENCOUNTER — Other Ambulatory Visit: Payer: Self-pay | Admitting: Family Medicine

## 2015-12-20 ENCOUNTER — Other Ambulatory Visit: Payer: Self-pay | Admitting: Internal Medicine

## 2015-12-20 NOTE — Telephone Encounter (Signed)
Rx printed, awaiting MD signature.  

## 2015-12-20 NOTE — Telephone Encounter (Signed)
Pt is requesting refill on Temazepam.  Last OV: 11/19/2015 Last Fill: 08/24/2015 #30 and 3RF UDS: 11/19/2015 Low risk  Please advise.

## 2015-12-20 NOTE — Telephone Encounter (Signed)
Rx faxed to [facility] pharmacy.  

## 2015-12-20 NOTE — Telephone Encounter (Signed)
Okay #30 and 4 refills 

## 2015-12-21 ENCOUNTER — Encounter: Payer: Self-pay | Admitting: Internal Medicine

## 2015-12-21 DIAGNOSIS — Z86010 Personal history of colonic polyps: Secondary | ICD-10-CM

## 2015-12-21 NOTE — Progress Notes (Signed)
1 adenoma 1ssp/a 1 lymphoid aggregate Recall colonoscopy 2022

## 2015-12-28 ENCOUNTER — Encounter: Payer: Self-pay | Admitting: Internal Medicine

## 2015-12-31 ENCOUNTER — Telehealth: Payer: Self-pay | Admitting: Hematology & Oncology

## 2015-12-31 NOTE — Telephone Encounter (Signed)
Due to weather patient's 01/03/16 apt was cx and resch for 01/19/16.  Patient was called and message was left about apt change.  Patient was asked to call office back to confirm apt.

## 2016-01-03 ENCOUNTER — Other Ambulatory Visit: Payer: Federal, State, Local not specified - PPO

## 2016-01-03 ENCOUNTER — Ambulatory Visit: Payer: Federal, State, Local not specified - PPO | Admitting: Hematology & Oncology

## 2016-01-11 ENCOUNTER — Ambulatory Visit (INDEPENDENT_AMBULATORY_CARE_PROVIDER_SITE_OTHER): Payer: Federal, State, Local not specified - PPO | Admitting: Behavioral Health

## 2016-01-11 DIAGNOSIS — Z23 Encounter for immunization: Secondary | ICD-10-CM

## 2016-01-11 NOTE — Progress Notes (Addendum)
Pre visit review using our clinic review tool, if applicable. No additional management support is needed unless otherwise documented below in the visit note.  Patient in clinic for Influenza vaccination. IM given in Left Deltoid. Patient tolerated injection well.

## 2016-01-19 ENCOUNTER — Encounter: Payer: Self-pay | Admitting: Hematology & Oncology

## 2016-01-19 ENCOUNTER — Ambulatory Visit (HOSPITAL_BASED_OUTPATIENT_CLINIC_OR_DEPARTMENT_OTHER): Payer: Federal, State, Local not specified - PPO | Admitting: Hematology & Oncology

## 2016-01-19 ENCOUNTER — Other Ambulatory Visit (HOSPITAL_BASED_OUTPATIENT_CLINIC_OR_DEPARTMENT_OTHER): Payer: Federal, State, Local not specified - PPO

## 2016-01-19 VITALS — BP 123/68 | HR 65 | Temp 97.9°F | Resp 16 | Ht 68.5 in | Wt 252.0 lb

## 2016-01-19 DIAGNOSIS — I2699 Other pulmonary embolism without acute cor pulmonale: Secondary | ICD-10-CM

## 2016-01-19 DIAGNOSIS — I2692 Saddle embolus of pulmonary artery without acute cor pulmonale: Secondary | ICD-10-CM

## 2016-01-19 LAB — CBC WITH DIFFERENTIAL (CANCER CENTER ONLY)
BASO#: 0 10*3/uL (ref 0.0–0.2)
BASO%: 0.5 % (ref 0.0–2.0)
EOS%: 3.2 % (ref 0.0–7.0)
Eosinophils Absolute: 0.1 10*3/uL (ref 0.0–0.5)
HCT: 36 % (ref 34.8–46.6)
HGB: 12 g/dL (ref 11.6–15.9)
LYMPH#: 1.8 10*3/uL (ref 0.9–3.3)
LYMPH%: 48.9 % — AB (ref 14.0–48.0)
MCH: 28.7 pg (ref 26.0–34.0)
MCHC: 33.3 g/dL (ref 32.0–36.0)
MCV: 86 fL (ref 81–101)
MONO#: 0.5 10*3/uL (ref 0.1–0.9)
MONO%: 13.4 % — ABNORMAL HIGH (ref 0.0–13.0)
NEUT#: 1.3 10*3/uL — ABNORMAL LOW (ref 1.5–6.5)
NEUT%: 34 % — AB (ref 39.6–80.0)
PLATELETS: 266 10*3/uL (ref 145–400)
RBC: 4.18 10*6/uL (ref 3.70–5.32)
RDW: 15.1 % (ref 11.1–15.7)
WBC: 3.7 10*3/uL — ABNORMAL LOW (ref 3.9–10.0)

## 2016-01-19 LAB — COMPREHENSIVE METABOLIC PANEL
ALT: 18 U/L (ref 0–55)
ANION GAP: 10 meq/L (ref 3–11)
AST: 21 U/L (ref 5–34)
Albumin: 3.6 g/dL (ref 3.5–5.0)
Alkaline Phosphatase: 59 U/L (ref 40–150)
BUN: 19.5 mg/dL (ref 7.0–26.0)
CALCIUM: 8.8 mg/dL (ref 8.4–10.4)
CHLORIDE: 106 meq/L (ref 98–109)
CO2: 27 meq/L (ref 22–29)
CREATININE: 0.9 mg/dL (ref 0.6–1.1)
EGFR: 81 mL/min/{1.73_m2} — ABNORMAL LOW (ref >90)
Glucose: 104 mg/dl (ref 70–140)
POTASSIUM: 3.7 meq/L (ref 3.5–5.1)
Sodium: 142 mEq/L (ref 136–145)
Total Bilirubin: 0.31 mg/dL (ref 0.20–1.20)
Total Protein: 7.3 g/dL (ref 6.4–8.3)

## 2016-01-19 NOTE — Progress Notes (Signed)
Hematology and Oncology Follow Up Visit  Jennifer [name] [id number] May 31, 1950 65 y.o. 01/19/2016   Principle Diagnosis:   Recurrent pulmonary embolism  Current Therapy:   Xarelto 20 mg by mouth daily - to finish 12/2015 Xarelto 10 mg po q day - maintenance for 1 year      Interim History:  Jennifer [name] is back for follow-up. She's doing pretty well. She is doing pretty well. She and her husband went to [facility] this summer. Her son got to his myocarditis. He has recovered from this.  So far, she has had no problems with the Xarelto. She has been on full dose Xarelto. She'll finish up for those Xarelto next month and then we will get her on 10 mg dose.  She's had no cough or shortness of breath. She has had no nausea or vomiting. She is getting ready to go to [facility] for vacation.  She has had no bleeding. There's been no bruising. She has had no fever. She's had no nausea or vomiting.  Overall, her performance status is ECOG 0.   Medications:  Current Outpatient Prescriptions:  .  ACIPHEX 20 MG tablet, TAKE ONE TABLET BY MOUTH TWICE DAILY, Disp: 60 tablet, Rfl: 0 .  ALPRAZolam (XANAX) 0.5 MG tablet, TAKE ONE TABLET BY MOUTH ONCE DAILY AS NEEDED FOR ANXIETY, Disp: 21 tablet, Rfl: 0 .  cycloSPORINE (RESTASIS) 0.05 % ophthalmic emulsion, Place 1 drop into both eyes 2 (two) times daily. , Disp: , Rfl:  .  doxycycline (VIBRAMYCIN) 100 MG capsule, Take 100 mg by mouth daily. Reported on 11/01/2015, Disp: , Rfl:  .  escitalopram (LEXAPRO) 10 MG tablet, Take 1 tablet (10 mg total) by mouth daily., Disp: 30 tablet, Rfl: 6 .  glucose blood test strip, Check blood sugar once daily, Disp: 100 each, Rfl: 12 .  hyoscyamine (LEVSIN SL) 0.125 MG SL tablet, Place 1 tablet (0.125 mg total) under the tongue every 4 (four) hours as needed., Disp: 30 tablet, Rfl: 2 .  lidocaine (LIDODERM) 5 %, Place 1-2 patches onto the skin daily as needed. Remove & Discard patch within 12 hours or as directed  by MD, Disp: 30 patch, Rfl: 1 .  ONETOUCH DELICA LANCETS [id number] MISC, Check blood sugar once daily, Disp: 100 each, Rfl: 12 .  rivaroxaban (XARELTO) 20 MG TABS tablet, Take 1 tablet (20 mg total) by mouth daily with supper., Disp: 30 tablet, Rfl: 5 .  sucralfate (CARAFATE) 1 GM/10ML suspension, Take 10 mLs (1 g total) by mouth 4 (four) times daily -  with meals and at bedtime., Disp: 420 mL, Rfl: 0 .  temazepam (RESTORIL) 30 MG capsule, Take 1 capsule (30 mg total) by mouth at bedtime as needed for sleep., Disp: 30 capsule, Rfl: 4 .  triamterene-hydrochlorothiazide (MAXZIDE-25) 37.5-25 MG tablet, Take 1 tablet by mouth daily., Disp: 30 tablet, Rfl: 5  Current Facility-Administered Medications:  .  0.9 %  sodium chloride infusion, 500 mL, Intravenous, Continuous, Gatha Mayer, MD  Allergies:  Allergies  Allergen Reactions  . Morphine And Related Anaphylaxis and Shortness Of Breath  . Promethazine Hcl Other (See Comments)    Hallucinations  . Sulfonamide Derivatives Hives    Past Medical History, Surgical history, Social history, and Family History were reviewed and updated.  Review of Systems: As above  Physical Exam:  height is 5' 8.5" (1.74 m) and weight is 252 lb (114.3 kg). Her oral temperature is 97.9 F (36.6 C). Her blood pressure  is 123/68 and her pulse is 65. Her respiration is 16.   Well-developed and well-nourished African-American female. Head and neck exam shows no ocular or oral lesions. She has no palpable cervical or supraclavicular lymph nodes. Lungs are clear. Cardiac exam regular rate and rhythm with no murmurs, rubs or bruits. Abdomen is soft. She has good bowel sounds. There is no fluid wave. There is no palpable liver or spleen tip. Back exam shows no tenderness over the spine, ribs or hips. Extremities shows no clubbing, cyanosis or edema. No venous cord is noted in the legs. Neurological exam is nonfocal. Skin exam shows no rashes, ecchymoses or petechia.  Lab  Results  Component Value Date   WBC 3.7 (L) 01/19/2016   HGB 12.0 01/19/2016   HCT 36.0 01/19/2016   MCV 86 01/19/2016   PLT 266 01/19/2016     Chemistry      Component Value Date/Time   NA 137 07/05/2015 1422   NA 142 08/03/2014 1203   K 3.7 07/05/2015 1422   K 3.4 08/03/2014 1203   CL 103 07/05/2015 1422   CL 108 08/03/2014 1203   CO2 30 (H) 07/05/2015 1422   CO2 27 08/03/2014 1203   BUN 14 07/05/2015 1422   BUN 15 08/03/2014 1203   CREATININE 0.90 07/05/2015 1422   CREATININE 0.9 08/03/2014 1203      Component Value Date/Time   CALCIUM 8.8 07/05/2015 1422   CALCIUM 8.5 08/03/2014 1203   ALKPHOS 52 07/05/2015 1422   ALKPHOS 63 08/03/2014 1203   AST 18 07/05/2015 1422   AST 22 08/03/2014 1203   ALT 13 07/05/2015 1422   ALT 19 08/03/2014 1203   BILITOT 0.3 07/05/2015 1422   BILITOT 0.60 08/03/2014 1203         Impression and Plan: Jennifer [name] is 65 year old African-American female. She has history of recurrent pulmonary emboli. She is idiopathic from her hypercoagulable studies.  From the latest studies, I think that we should get her on low-dose Xarelto for one year. I did this would be very reasonable. After that, then we might be over get her on aspirin. She would feel whole lot better being on Xarelto at a low-dose. She does travel a lot. With her traveling, she just would worry about the possibility of another thrombus.   I will plan to get her back in 6 months. I think this would be reasonable for follow-up.   [name], MD 9/27/20171:10 PM

## 2016-01-20 LAB — D-DIMER, QUANTITATIVE: D-DIMER: 0.41 mg/L FEU (ref 0.00–0.49)

## 2016-01-27 ENCOUNTER — Other Ambulatory Visit: Payer: Self-pay | Admitting: Internal Medicine

## 2016-02-15 ENCOUNTER — Other Ambulatory Visit: Payer: Self-pay | Admitting: *Deleted

## 2016-02-15 MED ORDER — RIVAROXABAN 10 MG PO TABS: 10.0000 mg | ORAL_TABLET | Freq: Every day | ORAL | 11 refills | Status: DC

## 2016-02-16 ENCOUNTER — Ambulatory Visit (INDEPENDENT_AMBULATORY_CARE_PROVIDER_SITE_OTHER): Payer: Federal, State, Local not specified - PPO | Admitting: Internal Medicine

## 2016-02-16 ENCOUNTER — Encounter: Payer: Self-pay | Admitting: Internal Medicine

## 2016-02-16 VITALS — BP 118/66 | HR 70 | Temp 98.1°F | Wt 253.4 lb

## 2016-02-16 DIAGNOSIS — R05 Cough: Secondary | ICD-10-CM

## 2016-02-16 DIAGNOSIS — R059 Cough, unspecified: Secondary | ICD-10-CM

## 2016-02-16 MED ORDER — ALBUTEROL SULFATE HFA 108 (90 BASE) MCG/ACT IN AERS: 2.0000 | INHALATION_SPRAY | Freq: Four times a day (QID) | RESPIRATORY_TRACT | 1 refills | Status: DC | PRN

## 2016-02-16 MED ORDER — AMOXICILLIN 500 MG PO CAPS
1000.0000 mg | ORAL_CAPSULE | Freq: Two times a day (BID) | ORAL | 0 refills | Status: DC
Start: 2016-02-16 — End: 2016-04-14

## 2016-02-16 NOTE — Patient Instructions (Signed)
Rest, fluids , tylenol  For cough:  Take Mucinex DM twice a day as needed until better  If you hear wheezing, has shortness of breath or have persisting cough: Take 2 puffs of albuterol every 6 hours as needed   If  nasal congestion: Use OTC Nasocort or Flonase : 2 nasal sprays on each side of the nose in the morning until you feel better   Avoid decongestants such as  Pseudoephedrine or phenylephrine     Take the antibiotic as prescribed  (Amoxicillin)  Call if not gradually better over the next  10 days  Call anytime if the symptoms are severe

## 2016-02-16 NOTE — Progress Notes (Signed)
Subjective:    Patient ID: Jennifer [name], female    DOB: 05-20-1950, 65 y.o.   MRN: [id number]  DOS:  02/16/2016 Type of visit - description : acute Interval history: Sx started 2 weeks ago: Cough, shortness of breath, anterior chest pain with cough. Very little sputum production, malaise. Has not been taking any OTCs. She is not a smoker, no asthma but in the past she has been prescribing inhalers   Review of Systems No fever chills, no sore throat, some runny nose. No nausea or vomiting No lower extremity edema or calf pain.  Past Medical History:  Diagnosis Date  . Anemia   . Anxiety   . Chronic chest pain   . Colon polyps    inflamed partially ulcerated hyperplastic polyp  . Diabetes ([place]) 09/24/2013  . Diverticulosis   . Dysphagia esophageal phase - chronic after 3 fundoplications [id number]  . Esophageal stenosis    Stricture after fundoplication REDO  . GERD (gastroesophageal reflux disease)   . Hemorrhoids   . Hypertension   . IBS (irritable bowel syndrome)   . Mitral valve prolapse   . OSA (obstructive sleep apnea)    on CPAP-Mild - Sleep study 03-Aug-2002  . Pulmonary embolus ([place])    2008/08/02 and 12-2013  . Shortness of breath    with exertion  . Tinnitus    Subjective  . Zoster Aug 02, 2012   R flank    Past Surgical History:  Procedure Laterality Date  . ABDOMINAL HYSTERECTOMY     still has 1 ovary   . APPENDECTOMY    . [place]   Stem surgery, Arnold Chiari Malformation  . BREAST REDUCTION SURGERY    . CATARACT EXTRACTION Bilateral 08-03-2015   cataracts, Dr. Herbert Deaner  . CHOLECYSTECTOMY  03-Aug-2006  . COLONOSCOPY    . ESOPHAGOGASTRODUODENOSCOPY    . HERNIA REPAIR     Hital Hernia  . KNEE ARTHROSCOPY  05/16/2012   Procedure: ARTHROSCOPY KNEE;  Surgeon: Sharmon Revere, MD;  Location: [facility];  Service: Orthopedics;  Laterality: Left;  . NISSEN FUNDOPLICATION     X 3 lab, open x 2 last with mesh    Social History   Social History  . Marital status: Married   Spouse name: N/A  . Number of children: 1  . Years of education: N/A   Occupational History  . retired form the [employer] 04-2015 [employer]   Social History Main Topics  . Smoking status: Never Smoker  . Smokeless tobacco: Never Used     Comment: never used tobacco  . Alcohol use No  . Drug use: No  . Sexual activity: Yes    Birth control/ protection: None   Other Topics Concern  . Not on file   Social History Narrative   [employer] Graduate - Technical school   Married 2068-08-02 - 2 years divorced; married 08-02-1984 - 2.5 years divorced; married 02-Aug-1988 - 1 year divorced; married '00   1 son - Aug 03, 2070   Sister - Died @ 3 [place]            Medication List       Accurate as of 02/16/16 11:59 PM. Always use your most recent med list.          ACIPHEX 20 MG tablet Generic drug:  RABEprazole TAKE ONE TABLET BY MOUTH TWICE DAILY   albuterol 108 (90 Base) MCG/ACT inhaler Commonly known as:  VENTOLIN HFA Inhale 2 puffs into the lungs  every 6 (six) hours as needed for wheezing or shortness of breath.   ALPRAZolam 0.5 MG tablet Commonly known as:  XANAX TAKE ONE TABLET BY MOUTH ONCE DAILY AS NEEDED FOR ANXIETY   amoxicillin 500 MG capsule Commonly known as:  AMOXIL Take 2 capsules (1,000 mg total) by mouth 2 (two) times daily.   cycloSPORINE 0.05 % ophthalmic emulsion Commonly known as:  RESTASIS Place 1 drop into both eyes 2 (two) times daily.   doxycycline 100 MG capsule Commonly known as:  VIBRAMYCIN Take 100 mg by mouth daily. Reported on 11/01/2015   escitalopram 10 MG tablet Commonly known as:  LEXAPRO Take 1 tablet (10 mg total) by mouth daily.   glucose blood test strip Check blood sugar once daily   hyoscyamine 0.125 MG SL tablet Commonly known as:  LEVSIN SL Place 1 tablet (0.125 mg total) under the tongue every 4 (four) hours as needed.   lidocaine 5 % Commonly known as:  LIDODERM Place 1-2 patches onto the skin daily as needed. Remove & Discard patch within 12  hours or as directed by MD   [facility] DELICA LANCETS [id number] Misc Check blood sugar once daily   rivaroxaban 10 MG Tabs tablet Commonly known as:  XARELTO Take 1 tablet (10 mg total) by mouth daily with supper.   sucralfate 1 GM/10ML suspension Commonly known as:  CARAFATE Take 10 mLs (1 g total) by mouth 4 (four) times daily -  with meals and at bedtime.   temazepam 30 MG capsule Commonly known as:  RESTORIL Take 1 capsule (30 mg total) by mouth at bedtime as needed for sleep.   triamterene-hydrochlorothiazide 37.5-25 MG tablet Commonly known as:  MAXZIDE-25 Take 1 tablet by mouth daily.          Objective:   Physical Exam BP 118/66 (BP Location: Left Arm, Patient Position: Sitting, Cuff Size: Normal)   Pulse 70   Temp 98.1 F (36.7 C) (Oral)   Wt 253 lb 6.4 oz (114.9 kg)   SpO2 96%   BMI 37.97 kg/m  General:   Well developed, well nourished . NAD.  HEENT:  Normocephalic . Face symmetric, atraumatic. TMs normal, nose not congested, so symmetric. Lungs:  Very few end expiratory wheezes, worse with cough. Normal respiratory effort, no intercostal retractions, no accessory muscle use. Heart: RRR,  no murmur.  No pretibial edema bilaterally . Calves symmetric Skin: Not pale. Not jaundice Neurologic:  alert & oriented X3.  Speech normal, gait appropriate for age and unassisted Psych--  Cognition and judgment appear intact.  Cooperative with normal attention span and concentration.  Behavior appropriate. No anxious or depressed appearing.      Assessment & Plan:   Assessment > DM, + neuropathy. Foot exam 02-2015 HTN Anxiety  Thyroid nodule: Bx (non neoplastic goiter) 2014, last [place] 08/2013 Pulmonary: --OSA, sleep study 2004, on a CPAP --Pulmonary emboli  --2010 and 12-2013 - saw hematology 12-2014, rec anticoag x 2 years (until 12-2015), then xarelto decreased to 10 mg qd x 1 year -- multiple pulmonary nodule, last CT 11-2013: No further imaging  suggested Fibromyalgia CV: --MVP --  ectatic R carotid artery, repeat ultrasound 04-2016 GI: --GERD, Esophageal stenosis --chronic dysphagia s/p  fundoplication 3  -- IBS -- chronic right flank,RUQ pain : since GB surgery 2008 Etiology not clear but likely multifactorial --> Adhesions from the surgery, neuropathy (DM), postherpetic neuralgia, scarring from PE, etc.  MRI T spine done 06-2014: DJD  Zoster --  2014 right flank  PLAN Cough, difficulty breathing: 2 weeks history of cough and difficulty breathing, he has a history of PE, fully anticoagulated with Xarelto (today she switch to half dose). On exam there are few end expiratory wheezes particularly with cough. Suspect sx are due to bronchitis, bronchospasm. Will treat accordingly, see instructions. Recommend to call if no better. Pulmonary emboli: Currently on Xarelto 20 mg, will switch to 10 mg tomorrow  per hematology recommendations.

## 2016-02-16 NOTE — Progress Notes (Signed)
Pre visit review using our clinic review tool, if applicable. No additional management support is needed unless otherwise documented below in the visit note. 

## 2016-02-17 NOTE — Assessment & Plan Note (Signed)
Cough, difficulty breathing: 2 weeks history of cough and difficulty breathing, he has a history of PE, fully anticoagulated with Xarelto (today she switch to half dose). On exam there are few end expiratory wheezes particularly with cough. Suspect sx are due to bronchitis, bronchospasm. Will treat accordingly, see instructions. Recommend to call if no better. Pulmonary emboli: Currently on Xarelto 20 mg, will switch to 10 mg tomorrow  per hematology recommendations.

## 2016-02-26 ENCOUNTER — Other Ambulatory Visit: Payer: Self-pay | Admitting: Internal Medicine

## 2016-04-13 ENCOUNTER — Other Ambulatory Visit: Payer: Self-pay | Admitting: Internal Medicine

## 2016-04-14 ENCOUNTER — Ambulatory Visit: Payer: Federal, State, Local not specified - PPO | Admitting: Internal Medicine

## 2016-04-14 ENCOUNTER — Ambulatory Visit (HOSPITAL_BASED_OUTPATIENT_CLINIC_OR_DEPARTMENT_OTHER)
Admission: RE | Admit: 2016-04-14 | Discharge: 2016-04-14 | Disposition: A | Discharge status: Home / Self Care | Payer: Federal, State, Local not specified - PPO | Source: Ambulatory Visit | Attending: Internal Medicine | Admitting: Internal Medicine

## 2016-04-14 ENCOUNTER — Encounter: Payer: Self-pay | Admitting: Internal Medicine

## 2016-04-14 ENCOUNTER — Ambulatory Visit (INDEPENDENT_AMBULATORY_CARE_PROVIDER_SITE_OTHER): Payer: Federal, State, Local not specified - PPO | Admitting: Internal Medicine

## 2016-04-14 VITALS — BP 124/68 | HR 81 | Temp 97.8°F | Resp 14 | Ht 68.5 in | Wt 251.4 lb

## 2016-04-14 DIAGNOSIS — R042 Hemoptysis: Secondary | ICD-10-CM | POA: Diagnosis not present

## 2016-04-14 DIAGNOSIS — R05 Cough: Secondary | ICD-10-CM

## 2016-04-14 DIAGNOSIS — R059 Cough, unspecified: Secondary | ICD-10-CM

## 2016-04-14 MED ORDER — BENZONATATE 200 MG PO CAPS: 200.0000 mg | ORAL_CAPSULE | Freq: Three times a day (TID) | ORAL | 0 refills | Status: DC | PRN

## 2016-04-14 NOTE — Progress Notes (Signed)
Subjective:    Patient ID: Jennifer [name], female    DOB: April 16, 1951, 65 y.o.   MRN: [id number]  DOS:  04/14/2016 Type of visit - description : Acute Interval history: Was seen about 2 months ago, had bronchitis, prescribed amoxicillin. She improved significantly with minimal residual symptoms. Last week, she went to [facility] in a bus trip with a number of people, pt suspect she catch a virus then b/c shortly after she developed chest congestion, cough. Cough was initially dry, then she had some clear mucus. Symptoms peak 2 days ago, she had several episodes of very hard cough,   3 times she saw a small amount of red blood  with the clear sputum. No rusty type of sputum. No further symptoms. She feels better today in general.  Review of Systems Denies actual chest pain but she felt some tightness. Actual fever chills No leg swelling or pain + Clear nasal discharge. No nausea, vomiting, diarrhea  Past Medical History:  Diagnosis Date  . Anemia   . Anxiety   . Chronic chest pain   . Colon polyps    inflamed partially ulcerated hyperplastic polyp  . Diabetes ([place]) 09/24/2013  . Diverticulosis   . Dysphagia esophageal phase - chronic after 3 fundoplications [id number]  . Esophageal stenosis    Stricture after fundoplication REDO  . GERD (gastroesophageal reflux disease)   . Hemorrhoids   . Hypertension   . IBS (irritable bowel syndrome)   . Mitral valve prolapse   . OSA (obstructive sleep apnea)    on CPAP-Mild - Sleep study 13-Aug-2002  . Pulmonary embolus ([place])    2008-08-12 and 12-2013  . Shortness of breath    with exertion  . Tinnitus    Subjective  . Zoster 2012/08/12   R flank    Past Surgical History:  Procedure Laterality Date  . ABDOMINAL HYSTERECTOMY     still has 1 ovary   . APPENDECTOMY    . [place]   Stem surgery, Arnold Chiari Malformation  . BREAST REDUCTION SURGERY    . CATARACT EXTRACTION Bilateral August 13, 2015   cataracts, Dr. Herbert Deaner  . CHOLECYSTECTOMY  08/13/06    . COLONOSCOPY    . ESOPHAGOGASTRODUODENOSCOPY    . HERNIA REPAIR     Hital Hernia  . KNEE ARTHROSCOPY  05/16/2012   Procedure: ARTHROSCOPY KNEE;  Surgeon: Sharmon Revere, MD;  Location: [place];  Service: Orthopedics;  Laterality: Left;  . NISSEN FUNDOPLICATION     X 3 lab, open x 2 last with mesh    Social History   Social History  . Marital status: Married    Spouse name: N/A  . Number of children: 1  . Years of education: N/A   Occupational History  . retired form the [employer] 04-2015 [employer]   Social History Main Topics  . Smoking status: Never Smoker  . Smokeless tobacco: Never Used     Comment: never used tobacco  . Alcohol use No  . Drug use: No  . Sexual activity: Yes    Birth control/ protection: None   Other Topics Concern  . Not on file   Social History Narrative   [employer] Graduate - Technical school   Married 2068/08/12 for 2 years, divorced; re-married 08-12-1984 for 2.5 years, divorced; re-married 1988-08-12 for 1 year, divorced; re-married '00   1 son - 08/13/2070   Sister - Died @ 38 [place]  Allergies as of 04/14/2016      Reactions   Morphine And Related Anaphylaxis, Shortness Of Breath   Promethazine Hcl Other (See Comments)   Hallucinations   Sulfonamide Derivatives Hives      Medication List       Accurate as of 04/14/16 11:59 PM. Always use your most recent med list.          ACIPHEX 20 MG tablet Generic drug:  RABEprazole TAKE ONE TABLET BY MOUTH TWICE DAILY   albuterol 108 (90 Base) MCG/ACT inhaler Commonly known as:  VENTOLIN HFA Inhale 2 puffs into the lungs every 6 (six) hours as needed for wheezing or shortness of breath.   ALPRAZolam 0.5 MG tablet Commonly known as:  XANAX TAKE ONE TABLET BY MOUTH ONCE DAILY AS NEEDED FOR ANXIETY   benzonatate 200 MG capsule Commonly known as:  TESSALON Take 1 capsule (200 mg total) by mouth 3 (three) times daily as needed for cough.   cycloSPORINE 0.05 % ophthalmic emulsion Commonly known as:   RESTASIS Place 1 drop into both eyes 2 (two) times daily.   escitalopram 10 MG tablet Commonly known as:  LEXAPRO Take 1 tablet (10 mg total) by mouth daily.   glucose blood test strip Check blood sugar once daily   hyoscyamine 0.125 MG SL tablet Commonly known as:  LEVSIN SL Place 1 tablet (0.125 mg total) under the tongue every 4 (four) hours as needed.   lidocaine 5 % Commonly known as:  LIDODERM Place 1-2 patches onto the skin daily as needed. Remove & Discard patch within 12 hours or as directed by MD   [facility] DELICA LANCETS 99991111 Misc Check blood sugar once daily   rivaroxaban 10 MG Tabs tablet Commonly known as:  XARELTO Take 1 tablet (10 mg total) by mouth daily with supper.   sucralfate 1 GM/10ML suspension Commonly known as:  CARAFATE Take 10 mLs (1 g total) by mouth 4 (four) times daily -  with meals and at bedtime.   temazepam 30 MG capsule Commonly known as:  RESTORIL Take 1 capsule (30 mg total) by mouth at bedtime as needed for sleep.   triamterene-hydrochlorothiazide 37.5-25 MG tablet Commonly known as:  MAXZIDE-25 Take 1 tablet by mouth daily.          Objective:   Physical Exam BP 124/68 (BP Location: Left Arm, Patient Position: Sitting, Cuff Size: Normal)   Pulse 81   Temp 97.8 F (36.6 C) (Oral)   Resp 14   Ht 5' 8.5" (1.74 m)   Wt 251 lb 6 oz (114 kg)   SpO2 97%   BMI 37.67 kg/m  General:   Well developed, well nourished . NAD.  HEENT:  Normocephalic . Face symmetric, atraumatic. Nose is slightly congested. Throat symmetric. Sinuses no TTP Lungs:  Slightly prolonged expiratory time, minimal rhonchi with cough. Normal respiratory effort, no intercostal retractions, no accessory muscle use. Heart: RRR,  no murmur.  No pretibial edema bilaterally; calves symmetric no TTP  Skin: Not pale. Not jaundice Neurologic:  alert & oriented X3.  Speech normal, gait appropriate for age and unassisted Psych--  Cognition and judgment appear  intact.  Cooperative with normal attention span and concentration.  Behavior appropriate. No anxious or depressed appearing.      Assessment & Plan:  Assessment > DM, + neuropathy. Foot exam 02-2015 HTN Anxiety  Thyroid nodule: Bx (non neoplastic goiter) 2014, last [place] 08/2013 Pulmonary: --OSA, sleep study 2004, on a CPAP --Pulmonary emboli  --2010  and 12-2013 - saw hematology 12-2014, rec anticoag x 2 years (until 12-2015), then xarelto decreased to 10 mg qd x 1 year -- multiple pulmonary nodule, last CT 11-2013: No further imaging suggested Fibromyalgia CV: --MVP --  ectatic R carotid artery, repeat ultrasound 04-2016 GI: --GERD, Esophageal stenosis --chronic dysphagia s/p  fundoplication 3  -- IBS -- chronic right flank,RUQ pain : since GB surgery 2008 Etiology not clear but likely multifactorial --> Adhesions from the surgery, neuropathy (DM), postherpetic neuralgia, scarring from PE, etc.  MRI T spine done 06-2014: DJD  Zoster --  2014 right flank   PLAN Cough, hemoptysis:  presents with respiratory sx, sx s peaked 2 days ago, she feels  slightly better. She is on low-dose of Xarelto, reports hemoptysis associated with hard episodes of cough. VSS , O2 sat normal, legs without swelling. I suspect some degree of bronchospasm although she does not have a formal diagnosis of asthma. Sx likely due to viral bronchitis but given hemoptysis will get a chest x-ray. Plan:  Mucinex, rest, Tylenol, Tessalon, Flonase, albuterol if cough is persistent. Chest x-ray and definitely call if has more episodes of hemoptysis .

## 2016-04-14 NOTE — Patient Instructions (Addendum)
Get your x-rays downstairs  Rest, fluids , tylenol  For cough:  Take Mucinex DM twice a day as needed until better You can also take Tessalon Perles as needed If the cough persist or you hear any chest congestion: Use albuterol 2 puffs up to every 6 hours  If  nasal congestion: Use OTC Nasocort or Flonase : 2 nasal sprays on each side of the nose in the morning until you feel better   Avoid decongestants such as  Pseudoephedrine or phenylephrine     Call if not gradually better over the next  10 days  Call anytime if the symptoms are severe, you have chest pain, leg swelling or more blood when you cough

## 2016-04-14 NOTE — Progress Notes (Signed)
Pre visit review using our clinic review tool, if applicable. No additional management support is needed unless otherwise documented below in the visit note. 

## 2016-04-16 NOTE — Assessment & Plan Note (Signed)
Cough, hemoptysis:  presents with respiratory sx, sx s peaked 2 days ago, she feels  slightly better. She is on low-dose of Xarelto, reports hemoptysis associated with hard episodes of cough. VSS , O2 sat normal, legs without swelling. I suspect some degree of bronchospasm although she does not have a formal diagnosis of asthma. Sx likely due to viral bronchitis but given hemoptysis will get a chest x-ray. Plan:  Mucinex, rest, Tylenol, Tessalon, Flonase, albuterol if cough is persistent. Chest x-ray and definitely call if has more episodes of hemoptysis .

## 2016-05-08 LAB — HM DIABETES EYE EXAM

## 2016-05-16 ENCOUNTER — Encounter: Payer: Self-pay | Admitting: Internal Medicine

## 2016-05-22 ENCOUNTER — Telehealth: Payer: Self-pay

## 2016-05-22 DIAGNOSIS — I739 Peripheral vascular disease, unspecified: Principal | ICD-10-CM

## 2016-05-22 DIAGNOSIS — I779 Disorder of arteries and arterioles, unspecified: Secondary | ICD-10-CM

## 2016-05-22 NOTE — Telephone Encounter (Signed)
Enter a order for a carotid ultrasound , dx carotid artery disease. No urgent.  Received: [place], MD  Damita Dunnings, [place]

## 2016-05-22 NOTE — Telephone Encounter (Signed)
Order placed

## 2016-05-23 ENCOUNTER — Encounter: Payer: Self-pay | Admitting: Internal Medicine

## 2016-05-23 ENCOUNTER — Ambulatory Visit (INDEPENDENT_AMBULATORY_CARE_PROVIDER_SITE_OTHER): Payer: Federal, State, Local not specified - PPO | Admitting: Internal Medicine

## 2016-05-23 ENCOUNTER — Other Ambulatory Visit: Payer: Self-pay | Admitting: Internal Medicine

## 2016-05-23 VITALS — BP 126/64 | HR 75 | Temp 97.5°F | Resp 14 | Ht 69.0 in | Wt 252.0 lb

## 2016-05-23 DIAGNOSIS — E041 Nontoxic single thyroid nodule: Secondary | ICD-10-CM | POA: Diagnosis not present

## 2016-05-23 DIAGNOSIS — I10 Essential (primary) hypertension: Secondary | ICD-10-CM

## 2016-05-23 DIAGNOSIS — M797 Fibromyalgia: Secondary | ICD-10-CM

## 2016-05-23 NOTE — Telephone Encounter (Signed)
Rx printed, awaiting MD signature.  

## 2016-05-23 NOTE — Telephone Encounter (Signed)
Pt is requesting refill on Temazepam.  Last OV: 04/14/2016 Last Fill: 12/20/2015 #30 and 4RF UDS: 11/19/2015 Low risk  Please advise.

## 2016-05-23 NOTE — Progress Notes (Signed)
Pre visit review using our clinic review tool, if applicable. No additional management support is needed unless otherwise documented below in the visit note. 

## 2016-05-23 NOTE — Progress Notes (Signed)
Subjective:    Patient ID: Jennifer [name], female    DOB: 01-08-1951, 66 y.o.   MRN: [id number]  DOS:  05/23/2016 Type of visit - description : rov Interval history: Doing well, no major concerns  DM: Ambulatory blood sugars when checked 90, 110. Concerned about her thyroid, due for ultrasound?.   Review of Systems  Denies chest pain or difficulty breathing No cough Still has occasional difficulty swallowing, not a new issue  Past Medical History:  Diagnosis Date  . Anemia   . Anxiety   . Chronic chest pain   . Colon polyps    inflamed partially ulcerated hyperplastic polyp  . Diabetes ([place]) 09/24/2013  . Diverticulosis   . Dysphagia esophageal phase - chronic after 3 fundoplications [id number]  . Esophageal stenosis    Stricture after fundoplication REDO  . GERD (gastroesophageal reflux disease)   . Hemorrhoids   . Hypertension   . IBS (irritable bowel syndrome)   . Mitral valve prolapse   . OSA (obstructive sleep apnea)    on CPAP-Mild - Sleep study 07-26-02  . Pulmonary embolus ([place])    07/25/08 and 12-2013  . Shortness of breath    with exertion  . Tinnitus    Subjective  . Zoster 2012-07-25   R flank    Past Surgical History:  Procedure Laterality Date  . ABDOMINAL HYSTERECTOMY     still has 1 ovary   . APPENDECTOMY    . [place]   Stem surgery, Arnold Chiari Malformation  . BREAST REDUCTION SURGERY    . CATARACT EXTRACTION Bilateral 2015/07/26   cataracts, Dr. Herbert Deaner  . CHOLECYSTECTOMY  26-Jul-2006  . COLONOSCOPY    . ESOPHAGOGASTRODUODENOSCOPY    . HERNIA REPAIR     Hital Hernia  . KNEE ARTHROSCOPY  05/16/2012   Procedure: ARTHROSCOPY KNEE;  Surgeon: Sharmon Revere, MD;  Location: [place];  Service: Orthopedics;  Laterality: Left;  . NISSEN FUNDOPLICATION     X 3 lab, open x 2 last with mesh    Social History   Social History  . Marital status: Married    Spouse name: N/A  . Number of children: 1  . Years of education: N/A   Occupational History  . retired  form the [employer] 04-2015 [employer]   Social History Main Topics  . Smoking status: Never Smoker  . Smokeless tobacco: Never Used     Comment: never used tobacco  . Alcohol use No  . Drug use: No  . Sexual activity: Yes    Birth control/ protection: None   Other Topics Concern  . Not on file   Social History Narrative   [employer] Graduate - Technical school   Married July 25, 2068 for 2 years, divorced; re-married 07-25-1984 for 2.5 years, divorced; re-married 07-25-1988 for 1 year, divorced; re-married '00   1 son - 2070-07-26   Sister - Died @ 17 [place]          Allergies as of 05/23/2016      Reactions   Morphine And Related Anaphylaxis, Shortness Of Breath   Promethazine Hcl Other (See Comments)   Hallucinations   Sulfonamide Derivatives Hives      Medication List       Accurate as of 05/23/16 11:59 PM. Always use your most recent med list.          ACIPHEX 20 MG tablet Generic drug:  RABEprazole TAKE ONE TABLET BY MOUTH TWICE DAILY   albuterol  108 (90 Base) MCG/ACT inhaler Commonly known as:  VENTOLIN HFA Inhale 2 puffs into the lungs every 6 (six) hours as needed for wheezing or shortness of breath.   ALPRAZolam 0.5 MG tablet Commonly known as:  XANAX TAKE ONE TABLET BY MOUTH ONCE DAILY AS NEEDED FOR ANXIETY   cycloSPORINE 0.05 % ophthalmic emulsion Commonly known as:  RESTASIS Place 1 drop into both eyes 2 (two) times daily.   escitalopram 10 MG tablet Commonly known as:  LEXAPRO Take 1 tablet (10 mg total) by mouth daily.   glucose blood test strip Check blood sugar once daily   ONETOUCH DELICA LANCETS 99991111 Misc Check blood sugar once daily   rivaroxaban 10 MG Tabs tablet Commonly known as:  XARELTO Take 1 tablet (10 mg total) by mouth daily with supper.   temazepam 30 MG capsule Commonly known as:  RESTORIL Take 1 capsule (30 mg total) by mouth at bedtime as needed for sleep.   triamterene-hydrochlorothiazide 37.5-25 MG tablet Commonly known as:  MAXZIDE-25 Take 1  tablet by mouth daily.          Objective:   Physical Exam BP 126/64 (BP Location: Left Arm, Patient Position: Sitting, Cuff Size: Normal)   Pulse 75   Temp 97.5 F (36.4 C) (Oral)   Resp 14   Ht 5\' 9"  (1.753 m)   Wt 252 lb (114.3 kg)   SpO2 95%   BMI 37.21 kg/m  General:   Well developed, well nourished . NAD.  HEENT:  Normocephalic . Face symmetric, atraumatic. Neck: Enlarged thyroid? Lungs:  CTA B Normal respiratory effort, no intercostal retractions, no accessory muscle use. Heart: RRR,  no murmur.  No pretibial edema bilaterally  Skin: Not pale. Not jaundice Neurologic:  alert & oriented X3.  Speech normal, gait appropriate for age and unassisted Psych--  Cognition and judgment appear intact.  Cooperative with normal attention span and concentration.  Behavior appropriate. No anxious or depressed appearing.      Assessment & Plan:    Assessment > DM, + neuropathy. Foot exam 04-2016 HTN Anxiety, insomnia: On restoril, lexapro, xanax for flying only Thyroid nodule: Bx (non neoplastic goiter) 2014, last [place] 08/2013 Pulmonary: --OSA, sleep study 2004, on a CPAP --Pulmonary emboli --2010 and 12-2013 - saw hematology 12-2014, rec anticoag x 2 years (until 12-2015), then xarelto decreased to 10 mg qd x 1 year -- multiple pulmonary nodule, last CT 11-2013: No further imaging suggested Fibromyalgia CV: --MVP --  ectatic R carotid artery, repeat ultrasound 04-2016 GI: --GERD, Esophageal stenosis --chronic dysphagia s/p  fundoplication 3  -- IBS -- chronic right flank,RUQ pain : since GB surgery 2008 Etiology not clear but likely multifactorial --> Adhesions from the surgery, neuropathy (DM), postherpetic neuralgia, scarring from PE, etc.  MRI T spine done 06-2014: DJD  Zoster --  2014 right flank   PLAN DM: Diet control, last A1c satisfactory HTN: Continue Maxide, check a BMP Thyroid nodule:  thyroid exam is essentially normal, will check a ultrasound and  TFTs. Fibromyalgia: Currently feeling well, she is using compression stockings and since then leg pain has decreased significantly. RTC 6 months CPX

## 2016-05-23 NOTE — Telephone Encounter (Signed)
Okay #30, 4 refills

## 2016-05-23 NOTE — Telephone Encounter (Signed)
Rx faxed to [facility] pharmacy.  

## 2016-05-23 NOTE — Patient Instructions (Signed)
  GO TO THE FRONT DESK Schedule your next appointment for a  exam in 6 months, fasting  Schedule labs to be done this week, no need to be fasting  We will schedule ultrasound of your thyroid

## 2016-05-24 NOTE — Assessment & Plan Note (Signed)
DM: Diet control, last A1c satisfactory HTN: Continue Maxide, check a BMP Thyroid nodule:  thyroid exam is essentially normal, will check a ultrasound and TFTs. Fibromyalgia: Currently feeling well, she is using compression stockings and since then leg pain has decreased significantly. RTC 6 months CPX

## 2016-05-25 ENCOUNTER — Other Ambulatory Visit (INDEPENDENT_AMBULATORY_CARE_PROVIDER_SITE_OTHER): Payer: Federal, State, Local not specified - PPO

## 2016-05-25 ENCOUNTER — Ambulatory Visit (HOSPITAL_BASED_OUTPATIENT_CLINIC_OR_DEPARTMENT_OTHER)
Admission: RE | Admit: 2016-05-25 | Discharge: 2016-05-25 | Disposition: A | Discharge status: Home / Self Care | Payer: Federal, State, Local not specified - PPO | Source: Ambulatory Visit | Attending: Internal Medicine | Admitting: Internal Medicine

## 2016-05-25 DIAGNOSIS — I10 Essential (primary) hypertension: Secondary | ICD-10-CM

## 2016-05-25 DIAGNOSIS — I779 Disorder of arteries and arterioles, unspecified: Secondary | ICD-10-CM | POA: Diagnosis not present

## 2016-05-25 DIAGNOSIS — E041 Nontoxic single thyroid nodule: Secondary | ICD-10-CM

## 2016-05-25 DIAGNOSIS — I6523 Occlusion and stenosis of bilateral carotid arteries: Secondary | ICD-10-CM | POA: Diagnosis not present

## 2016-05-25 LAB — BASIC METABOLIC PANEL
BUN: 18 mg/dL (ref 6–23)
CHLORIDE: 102 meq/L (ref 96–112)
CO2: 29 meq/L (ref 19–32)
Calcium: 9.3 mg/dL (ref 8.4–10.5)
Creatinine, Ser: 0.98 mg/dL (ref 0.40–1.20)
GFR: 73.61 mL/min (ref >60.00)
GLUCOSE: 97 mg/dL (ref 70–99)
POTASSIUM: 3.7 meq/L (ref 3.5–5.1)
SODIUM: 138 meq/L (ref 135–145)

## 2016-05-25 LAB — TSH: TSH: 2.72 u[IU]/mL (ref 0.35–4.50)

## 2016-05-25 LAB — T3, FREE: T3, Free: 3.3 pg/mL (ref 2.3–4.2)

## 2016-05-25 LAB — T4, FREE: FREE T4: 0.71 ng/dL (ref 0.60–1.60)

## 2016-06-23 ENCOUNTER — Other Ambulatory Visit: Payer: Self-pay | Admitting: Internal Medicine

## 2016-07-18 ENCOUNTER — Ambulatory Visit: Payer: Federal, State, Local not specified - PPO | Admitting: Hematology & Oncology

## 2016-07-18 ENCOUNTER — Telehealth: Payer: Self-pay | Admitting: Hematology & Oncology

## 2016-07-18 ENCOUNTER — Other Ambulatory Visit: Payer: Federal, State, Local not specified - PPO

## 2016-07-18 NOTE — Telephone Encounter (Signed)
Patient called and cx 07/18/16 apt due to being sick.  She resch for 07/27/16

## 2016-07-27 ENCOUNTER — Ambulatory Visit: Payer: Federal, State, Local not specified - PPO | Admitting: Family

## 2016-07-27 ENCOUNTER — Other Ambulatory Visit: Payer: Federal, State, Local not specified - PPO

## 2016-07-31 ENCOUNTER — Other Ambulatory Visit: Payer: Self-pay | Admitting: Internal Medicine

## 2016-08-04 ENCOUNTER — Telehealth: Payer: Self-pay | Admitting: Hematology & Oncology

## 2016-08-04 ENCOUNTER — Telehealth: Payer: Self-pay | Admitting: Internal Medicine

## 2016-08-04 ENCOUNTER — Other Ambulatory Visit: Payer: Self-pay | Admitting: Internal Medicine

## 2016-08-04 DIAGNOSIS — Z1231 Encounter for screening mammogram for malignant neoplasm of breast: Secondary | ICD-10-CM

## 2016-08-04 NOTE — Telephone Encounter (Signed)
Patient called and resch 07/18/16 cx apt for 08/17/16.  Calendar was mailed to patient's home also

## 2016-08-04 NOTE — Telephone Encounter (Signed)
Relation to OJ:JKKX Call back number:[phone] or mobile [phone]    Reason for call:  Patient checking on the status of imaging orders, stating she was suppose to receive a call and never received, please advise

## 2016-08-04 NOTE — Telephone Encounter (Signed)
Do you know about which orders Pt is referring?

## 2016-08-07 NOTE — Telephone Encounter (Signed)
Spoke w/ Pt, she had not heard to schedule her thyroid ultrasound, informed that she had ultrasound completed on 05/25/2016. Pt verbalized understanding.

## 2016-08-07 NOTE — Telephone Encounter (Signed)
I check by recent notes and results, I'm not sure what she is referring to. If she could be more specific I could help.

## 2016-08-07 NOTE — Telephone Encounter (Signed)
Tried calling Pt, not home, left message to have her return my call.

## 2016-08-11 ENCOUNTER — Telehealth: Payer: Self-pay | Admitting: Internal Medicine

## 2016-08-11 DIAGNOSIS — R1310 Dysphagia, unspecified: Secondary | ICD-10-CM

## 2016-08-11 NOTE — Telephone Encounter (Signed)
°  Relation to TK:PTWS Call back [phone]   Reason for call:  Patient requesting a referral to Gatha Mayer, MD due to difficulty swallowing, please advise

## 2016-08-11 NOTE — Telephone Encounter (Signed)
Referral placed to [name] for dysphagia.

## 2016-08-14 ENCOUNTER — Ambulatory Visit: Payer: Federal, State, Local not specified - PPO | Admitting: Internal Medicine

## 2016-08-17 ENCOUNTER — Ambulatory Visit (HOSPITAL_BASED_OUTPATIENT_CLINIC_OR_DEPARTMENT_OTHER): Payer: Federal, State, Local not specified - PPO | Admitting: Hematology & Oncology

## 2016-08-17 ENCOUNTER — Other Ambulatory Visit (HOSPITAL_BASED_OUTPATIENT_CLINIC_OR_DEPARTMENT_OTHER): Payer: Federal, State, Local not specified - PPO

## 2016-08-17 VITALS — BP 123/64 | HR 72 | Temp 98.0°F | Resp 19 | Wt 251.1 lb

## 2016-08-17 DIAGNOSIS — I2699 Other pulmonary embolism without acute cor pulmonale: Secondary | ICD-10-CM

## 2016-08-17 DIAGNOSIS — I2692 Saddle embolus of pulmonary artery without acute cor pulmonale: Secondary | ICD-10-CM

## 2016-08-17 LAB — CBC WITH DIFFERENTIAL (CANCER CENTER ONLY)
BASO#: 0 10*3/uL (ref 0.0–0.2)
BASO%: 0.5 % (ref 0.0–2.0)
EOS%: 4 % (ref 0.0–7.0)
Eosinophils Absolute: 0.2 10*3/uL (ref 0.0–0.5)
HCT: 37.6 % (ref 34.8–46.6)
HGB: 12.7 g/dL (ref 11.6–15.9)
LYMPH#: 1.6 10*3/uL (ref 0.9–3.3)
LYMPH%: 36.8 % (ref 14.0–48.0)
MCH: 29 pg (ref 26.0–34.0)
MCHC: 33.8 g/dL (ref 32.0–36.0)
MCV: 86 fL (ref 81–101)
MONO#: 0.5 10*3/uL (ref 0.1–0.9)
MONO%: 12.6 % (ref 0.0–13.0)
NEUT#: 2 10*3/uL (ref 1.5–6.5)
NEUT%: 46.1 % (ref 39.6–80.0)
Platelets: 244 10*3/uL (ref 145–400)
RBC: 4.38 10*6/uL (ref 3.70–5.32)
RDW: 14.9 % (ref 11.1–15.7)
WBC: 4.3 10*3/uL (ref 3.9–10.0)

## 2016-08-17 LAB — CMP (CANCER CENTER ONLY)
ALK PHOS: 73 U/L (ref 26–84)
ALT: 23 U/L (ref 10–47)
AST: 31 U/L (ref 11–38)
Albumin: 3.9 g/dL (ref 3.3–5.5)
BUN, Bld: 15 mg/dL (ref 7–22)
CHLORIDE: 105 meq/L (ref 98–108)
CO2: 28 mEq/L (ref 18–33)
Calcium: 8.9 mg/dL (ref 8.0–10.3)
Creat: 1 mg/dl (ref 0.6–1.2)
GLUCOSE: 100 mg/dL (ref 73–118)
POTASSIUM: 3.5 meq/L (ref 3.3–4.7)
Sodium: 141 mEq/L (ref 128–145)
TOTAL PROTEIN: 7.5 g/dL (ref 6.4–8.1)
Total Bilirubin: 0.6 mg/dl (ref 0.20–1.60)

## 2016-08-17 NOTE — Progress Notes (Signed)
Hematology and Oncology Follow Up Visit  Jennifer [name] [id number] September 29, 1950 66 y.o. 08/17/2016   Principle Diagnosis:   Recurrent pulmonary embolism  Current Therapy:   Xarelto 20 mg by mouth daily - to finish 12/2015 Xarelto 10 mg po q day - maintenance for 1 year      Interim History:  Jennifer [name] is back for follow-up. She is feeling okay. She has not had any issues with bleeding. She's had no cough or shortness of breath. She's had no change in bowel or bladder habits.  She's had no fever. We last saw her back in September 2017. She now is on maintenance Xarelto.  She's had no rashes. She's had no issues with nausea or vomiting.  There's been no headache. She's had no visual issues.  There's been no change in medications.  Back in February, she had carotid Dopplers done. These looked fantastic with a less than 50% stenosis.  Overall, her performance status is ECOG 0.   Medications:  Current Outpatient Prescriptions:  .  ACIPHEX 20 MG tablet, TAKE ONE TABLET BY MOUTH TWICE DAILY, Disp: 60 tablet, Rfl: 5 .  albuterol (VENTOLIN HFA) 108 (90 Base) MCG/ACT inhaler, Inhale 2 puffs into the lungs every 6 (six) hours as needed for wheezing or shortness of breath., Disp: 1 Inhaler, Rfl: 1 .  ALPRAZolam (XANAX) 0.5 MG tablet, TAKE ONE TABLET BY MOUTH ONCE DAILY AS NEEDED FOR ANXIETY (Patient not taking: Reported on 05/23/2016), Disp: 21 tablet, Rfl: 0 .  cycloSPORINE (RESTASIS) 0.05 % ophthalmic emulsion, Place 1 drop into both eyes 2 (two) times daily. , Disp: , Rfl:  .  escitalopram (LEXAPRO) 10 MG tablet, Take 1 tablet (10 mg total) by mouth daily., Disp: 30 tablet, Rfl: 5 .  glucose blood test strip, Check blood sugar once daily (Patient not taking: Reported on 04/14/2016), Disp: 100 each, Rfl: 12 .  ONETOUCH DELICA LANCETS 70Y MISC, Check blood sugar once daily (Patient not taking: Reported on 04/14/2016), Disp: 100 each, Rfl: 12 .  rivaroxaban (XARELTO) 10 MG TABS tablet, Take 1  tablet (10 mg total) by mouth daily with supper., Disp: 30 tablet, Rfl: 11 .  temazepam (RESTORIL) 30 MG capsule, Take 1 capsule (30 mg total) by mouth at bedtime as needed for sleep., Disp: 30 capsule, Rfl: 4 .  triamterene-hydrochlorothiazide (MAXZIDE-25) 37.5-25 MG tablet, Take 1 tablet by mouth daily., Disp: 90 tablet, Rfl: 1  Current Facility-Administered Medications:  .  0.9 %  sodium chloride infusion, 500 mL, Intravenous, Continuous, Gatha Mayer, MD  Allergies:  Allergies  Allergen Reactions  . Morphine And Related Anaphylaxis and Shortness Of Breath  . Promethazine Hcl Other (See Comments)    Hallucinations  . Sulfonamide Derivatives Hives    Past Medical History, Surgical history, Social history, and Family History were reviewed and updated.  Review of Systems: As above  Physical Exam:  weight is 251 lb 1.9 oz (113.9 kg). Her oral temperature is 98 F (36.7 C). Her blood pressure is 123/64 and her pulse is 72. Her respiration is 19 and oxygen saturation is 93%.   Well-developed and well-nourished African-American female. Head and neck exam shows no ocular or oral lesions. She has no palpable cervical or supraclavicular lymph nodes. Lungs are clear. Cardiac exam regular rate and rhythm with no murmurs, rubs or bruits. Abdomen is soft. She has good bowel sounds. There is no fluid wave. There is no palpable liver or spleen tip. Back exam shows no tenderness over the  spine, ribs or hips. Extremities shows no clubbing, cyanosis or edema. No venous cord is noted in the legs. Neurological exam is nonfocal. Skin exam shows no rashes, ecchymoses or petechia.  Lab Results  Component Value Date   WBC 4.3 08/17/2016   HGB 12.7 08/17/2016   HCT 37.6 08/17/2016   MCV 86 08/17/2016   PLT 244 08/17/2016     Chemistry      Component Value Date/Time   NA 141 08/17/2016 1344   NA 142 01/19/2016 1141   K 3.5 08/17/2016 1344   K 3.7 01/19/2016 1141   CL 105 08/17/2016 1344   CO2 28  08/17/2016 1344   CO2 27 01/19/2016 1141   BUN 15 08/17/2016 1344   BUN 19.5 01/19/2016 1141   CREATININE 1.0 08/17/2016 1344   CREATININE 0.9 01/19/2016 1141      Component Value Date/Time   CALCIUM 8.9 08/17/2016 1344   CALCIUM 8.8 01/19/2016 1141   ALKPHOS 73 08/17/2016 1344   ALKPHOS 59 01/19/2016 1141   AST 31 08/17/2016 1344   AST 21 01/19/2016 1141   ALT 23 08/17/2016 1344   ALT 18 01/19/2016 1141   BILITOT 0.60 08/17/2016 1344   BILITOT 0.31 01/19/2016 1141         Impression and Plan: Jennifer [name] is 66 year old African-American female. She has history of recurrent pulmonary emboli. She is idiopathic from her hypercoagulable studies.  For right now, we will continue her on the low-dose Xarelto. I really think this is a good idea for her. The risk of bleeding is less than 5%.  I will plan to get her back in 4 more months. I would like to see how she is doing.   [name], MD 4/26/20182:58 PM

## 2016-08-18 LAB — D-DIMER, QUANTITATIVE (NOT AT ARMC): D-DIMER: 0.33 mg{FEU}/L (ref 0.00–0.49)

## 2016-08-24 ENCOUNTER — Encounter: Payer: Self-pay | Admitting: Family Medicine

## 2016-08-24 ENCOUNTER — Ambulatory Visit (INDEPENDENT_AMBULATORY_CARE_PROVIDER_SITE_OTHER): Payer: Federal, State, Local not specified - PPO | Admitting: Family Medicine

## 2016-08-24 ENCOUNTER — Ambulatory Visit (HOSPITAL_BASED_OUTPATIENT_CLINIC_OR_DEPARTMENT_OTHER)
Admission: RE | Admit: 2016-08-24 | Discharge: 2016-08-24 | Disposition: A | Discharge status: Home / Self Care | Payer: Federal, State, Local not specified - PPO | Source: Ambulatory Visit | Attending: Family Medicine | Admitting: Family Medicine

## 2016-08-24 VITALS — BP 126/78 | HR 86 | Temp 98.0°F | Resp 14 | Ht 69.0 in | Wt 251.2 lb

## 2016-08-24 DIAGNOSIS — M79605 Pain in left leg: Secondary | ICD-10-CM | POA: Diagnosis present

## 2016-08-24 MED ORDER — METHYLPREDNISOLONE 4 MG PO TBPK: ORAL_TABLET | ORAL | 0 refills | Status: DC

## 2016-08-24 NOTE — Progress Notes (Signed)
Musculoskeletal Exam  Patient: Jennifer [name] DOB: 26-Mar-1951  DOS: 08/24/2016  SUBJECTIVE:   Chief Complaint:   Chief Complaint  Patient presents with  . Leg Pain    x1 month, L lower leg  . Epistaxis    this AM, denies nasal congestion     Jennifer [name] is a 66 y.o.  female for evaluation and treatment of L leg pain.   Onset:  1 month ago. Sudden, No injury or change in activity/footwear.  Location: anterior LE Character:  aching  Progression of issue:  has worsened slightly Associated symptoms:  Treatment: to date has been acetaminophen.   Neurovascular symptoms: no Pt is on Xarelto for recurrent DVT and PE, seeing heme. She states that she has had a clot in hte past while on anticoagulation and is very concerned that she has another one.   ROS: Musculoskeletal/Extremities: +L LE pain Neurologic: no numbness, tingling no weakness   Past Medical History:  Diagnosis Date  . Anemia   . Anxiety   . Chronic chest pain   . Colon polyps    inflamed partially ulcerated hyperplastic polyp  . Diabetes ([place]) 09/24/2013  . Diverticulosis   . Dysphagia esophageal phase - chronic after 3 fundoplications 8/84/1660  . Esophageal stenosis    Stricture after fundoplication REDO  . GERD (gastroesophageal reflux disease)   . Hemorrhoids   . Hypertension   . IBS (irritable bowel syndrome)   . Mitral valve prolapse   . OSA (obstructive sleep apnea)    on CPAP-Mild - Sleep study 2004  . Pulmonary embolus ([place])    2010 and 12-2013  . Shortness of breath    with exertion  . Tinnitus    Subjective  . Zoster 2014   R flank   Past Surgical History:  Procedure Laterality Date  . ABDOMINAL HYSTERECTOMY     still has 1 ovary   . APPENDECTOMY    . [place]   Stem surgery, Arnold Chiari Malformation  . BREAST REDUCTION SURGERY    . CATARACT EXTRACTION Bilateral 2017   cataracts, Dr. Herbert Deaner  . CHOLECYSTECTOMY  2008  . COLONOSCOPY    . ESOPHAGOGASTRODUODENOSCOPY    .  HERNIA REPAIR     Hital Hernia  . KNEE ARTHROSCOPY  05/16/2012   Procedure: ARTHROSCOPY KNEE;  Surgeon: Sharmon Revere, MD;  Location: [facility];  Service: Orthopedics;  Laterality: Left;  . NISSEN FUNDOPLICATION     X 3 lab, open x 2 last with mesh   Family History  Problem Relation Age of Onset  . Diabetes Mother   . Rheum arthritis Mother   . Hypertension Mother   . Hypertension Father   . Rheum arthritis Sister   . Hypertension Sister   . Colon cancer Maternal Grandmother   . Rheum arthritis Sister   . Rheum arthritis Sister   . Rheum arthritis Sister   . CAD Sister     MIdx age 60  . Breast cancer Neg Hx    Current Outpatient Prescriptions  Medication Sig Dispense Refill  . ACIPHEX 20 MG tablet TAKE ONE TABLET BY MOUTH TWICE DAILY 60 tablet 5  . albuterol (VENTOLIN HFA) 108 (90 Base) MCG/ACT inhaler Inhale 2 puffs into the lungs every 6 (six) hours as needed for wheezing or shortness of breath. 1 Inhaler 1  . cycloSPORINE (RESTASIS) 0.05 % ophthalmic emulsion Place 1 drop into both eyes 2 (two) times daily.     Marland Kitchen escitalopram (LEXAPRO) 10  MG tablet Take 1 tablet (10 mg total) by mouth daily. 30 tablet 5  . rivaroxaban (XARELTO) 10 MG TABS tablet Take 1 tablet (10 mg total) by mouth daily with supper. 30 tablet 11  . temazepam (RESTORIL) 30 MG capsule Take 1 capsule (30 mg total) by mouth at bedtime as needed for sleep. 30 capsule 4  . triamterene-hydrochlorothiazide (MAXZIDE-25) 37.5-25 MG tablet Take 1 tablet by mouth daily. 90 tablet 1  . ALPRAZolam (XANAX) 0.5 MG tablet TAKE ONE TABLET BY MOUTH ONCE DAILY AS NEEDED FOR ANXIETY (Patient not taking: Reported on 05/23/2016) 21 tablet 0  . glucose blood test strip Check blood sugar once daily (Patient not taking: Reported on 04/14/2016) 100 each 12  . ONETOUCH DELICA LANCETS 97D MISC Check blood sugar once daily (Patient not taking: Reported on 04/14/2016) 100 each 12   Allergies  Allergen Reactions  . Morphine And Related  Anaphylaxis and Shortness Of Breath  . Promethazine Hcl Other (See Comments)    Hallucinations  . Sulfonamide Derivatives Hives   Social History   Social History  . Marital status: Married   Occupational History  . retired form the [employer] 04-2015 [employer]   Social History Main Topics  . Smoking status: Never Smoker  . Smokeless tobacco: Never Used     Comment: never used tobacco  . Alcohol use No  . Drug use: No  . 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 No acute distress. Cardiovascular: Brisk cap refill Thorax & Lungs: No accessory muscle use Extremities: No clubbing. No cyanosis. No edema.  Skin: Warm. Dry. No erythema. No rash.  Musculoskeletal: LLE.   Tenderness to palpation: yes over TA on L; no bony tenderness Deformity: no Ecchymosis: no Antalgic gait Neurologic: Normal sensory function. Psychiatric: Normal mood. Age appropriate judgment and insight. Alert & oriented x 3.    Assessment:  Pain of left lower extremity - Plan: Ambulatory referral to Physical Therapy, methylPREDNISolone (MEDROL DOSEPAK) 4 MG TBPK tablet, US Venous Img Lower Unilateral Left  Plan: Orders as above.  Steroid as she cannot take NSAIDs due to anticoagulation. LE doppler given hx of DVT on anticoagulation and high anxiety. PT for likely msk injury. F/u prn. The patient voiced understanding and agreement to the plan.   [place], DO 08/24/16  2:04 PM

## 2016-08-24 NOTE — Patient Instructions (Addendum)
If you do not hear anything about your physical therapy referral in the next 1-2 weeks, call our office and ask for an update.  Ice/cold pack over area for 10-15 min every 2-3 hours while awake.  Heat (pad or rice pillow in microwave) over affected area, 10-15 minutes every 2-3 hours while awake.   Do whichever is more helpful for your pain.   OK to take Tylenol 1000 mg (2 extra strength tabs) or 975 mg (3 regular strength tabs) every 6 hours as needed. OK to use with medicine.

## 2016-08-24 NOTE — Progress Notes (Signed)
Pre visit review using our clinic review tool, if applicable. No additional management support is needed unless otherwise documented below in the visit note. 

## 2016-08-26 ENCOUNTER — Other Ambulatory Visit: Payer: Self-pay | Admitting: Internal Medicine

## 2016-08-28 ENCOUNTER — Ambulatory Visit
Admission: RE | Admit: 2016-08-28 | Discharge: 2016-08-28 | Disposition: A | Discharge status: Home / Self Care | Payer: Federal, State, Local not specified - PPO | Source: Ambulatory Visit | Attending: Internal Medicine | Admitting: Internal Medicine

## 2016-08-28 DIAGNOSIS — Z1231 Encounter for screening mammogram for malignant neoplasm of breast: Secondary | ICD-10-CM

## 2016-09-11 ENCOUNTER — Ambulatory Visit: Payer: Federal, State, Local not specified - PPO | Attending: Family Medicine | Admitting: Physical Therapy

## 2016-09-11 DIAGNOSIS — R29898 Other symptoms and signs involving the musculoskeletal system: Secondary | ICD-10-CM | POA: Diagnosis present

## 2016-09-11 DIAGNOSIS — M6281 Muscle weakness (generalized): Secondary | ICD-10-CM

## 2016-09-11 DIAGNOSIS — M79662 Pain in left lower leg: Secondary | ICD-10-CM

## 2016-09-11 NOTE — Therapy (Signed)
[facility] [address]  [place], [place], [address] Phone: [phone]   Fax:  [phone]  Physical Therapy Evaluation  Patient Details  Name: ATISHA HAMIDI MRN: [id number] Date of Birth: [birth year] Referring Provider: Ames Coupe, DO  Encounter Date: 09/11/2016      PT End of Session - 09/11/16 1306    Visit Number 1   Number of Visits 12   Date for PT Re-Evaluation 10/27/16   Authorization Type Federal BCBS & T[employer]   PT Start Time 1306   PT Stop Time 1400   PT Time Calculation (min) 54 min   Activity Tolerance Patient tolerated treatment well   Behavior During Therapy [facility] for tasks assessed/performed      Past Medical History:  Diagnosis Date  . Anemia   . Anxiety   . Chronic chest pain   . Colon polyps    inflamed partially ulcerated hyperplastic polyp  . Diabetes ([place]) 09/24/2013  . Diverticulosis   . Dysphagia esophageal phase - chronic after 3 fundoplications 3/57/0177  . Esophageal stenosis    Stricture after fundoplication REDO  . GERD (gastroesophageal reflux disease)   . Hemorrhoids   . Hypertension   . IBS (irritable bowel syndrome)   . Mitral valve prolapse   . OSA (obstructive sleep apnea)    on CPAP-Mild - Sleep study 2004  . Pulmonary embolus ([place])    2010 and 12-2013  . Shortness of breath    with exertion  . Tinnitus    Subjective  . Zoster 2014   R flank    Past Surgical History:  Procedure Laterality Date  . ABDOMINAL HYSTERECTOMY     still has 1 ovary   . APPENDECTOMY    . [place]   Stem surgery, Arnold Chiari Malformation  . BREAST REDUCTION SURGERY    . CATARACT EXTRACTION Bilateral 2017   cataracts, Dr. Herbert Deaner  . CHOLECYSTECTOMY  2008  . COLONOSCOPY    . ESOPHAGOGASTRODUODENOSCOPY    . HERNIA REPAIR     Hital Hernia  . KNEE ARTHROSCOPY  05/16/2012   Procedure: ARTHROSCOPY KNEE;  Surgeon: Sharmon Revere, MD;  Location: [facility];  Service:  Orthopedics;  Laterality: Left;  . NISSEN FUNDOPLICATION     X 3 lab, open x 2 last with mesh  . REDUCTION MAMMAPLASTY Bilateral 2007    There were no vitals filed for this visit.       Subjective Assessment - 09/11/16 1308    Subjective Pt reporting onset of L anteriolateral shin pain ~ 3 months ago w/o known triggering event, worsening gradually with swelling also present. Was concerned for blood clot but doppler US negative. Pt reports h/o LE DVT with PE following fall with trauma to L knee. Has been on blood thinners since. Distal LE pain now lessened following steriod dose pack.   Pertinent History L knee arthroscopy 04/2012 (meniscal injury per pt report); h/o mutiple DVT's in L LE with PE   Diagnostic tests 08/24/16 LE venous doppler: No evidence of DVT within the left lower extremity.   Patient Stated Goals "to find out what's going on with my legs and fix it; get my legs strong"   Currently in Pain? Yes   Pain Score 2    Pain Location Leg   Pain Orientation Left;Anterior;Distal   Pain Descriptors / Indicators Aching   Pain Type Acute pain   Pain Onset More than a month ago  ~  3 months   Pain Frequency Intermittent   Aggravating Factors  weight bearing   Pain Relieving Factors steroid pack, wearing compression socks   Effect of Pain on Daily Activities n/a            [facility] PT Assessment - 09/11/16 1306      Assessment   Medical Diagnosis Pain in L LE - distal lateral thigh & anteriolateral lower leg   Referring Provider Ames Coupe, DO   Onset Date/Surgical Date --  ~3 months   Next MD Visit none scheduled   Prior Therapy 2014 following L knee arthroscopy     Balance Screen   Has the patient fallen in the past 6 months No   Has the patient had a decrease in activity level because of a fear of falling?  No   Is the patient reluctant to leave their home because of a fear of falling?  No     Home Environment   Living Environment Private residence   Type of  [place] to enter   Entrance Stairs-Number of Steps 1   [place] One level     Prior Function   Level of [place] Retired   Leisure gym 3-4x/wk for 1 hr - walking on track, primarily upper body at present     Observation/Other Assessments   Focus on Therapeutic Outcomes (FOTO)  Lower leg - 83% (17% limitation); predicted 79% (21% limitation)     ROM / Strength   AROM / PROM / Strength AROM;Strength     AROM   AROM Assessment Site Knee;Ankle   Right/Left Knee Right;Left   Right Knee Extension 0   Right Knee Flexion 114   Left Knee Extension 2  open chain; 0 degrees supported   Left Knee Flexion 103   Right/Left Ankle Right;Left   Right Ankle Dorsiflexion 6   Right Ankle Plantar Flexion 48   Right Ankle Inversion 32   Right Ankle Eversion 16   Left Ankle Dorsiflexion 8   Left Ankle Plantar Flexion 45   Left Ankle Inversion 30   Left Ankle Eversion 22     Strength   Strength Assessment Site Hip;Knee;Ankle   Right/Left Hip Right;Left   Right Hip Flexion 4/5   Right Hip Extension 3+/5   Right Hip External Rotation  4-/5   Right Hip Internal Rotation 4-/5   Right Hip ABduction 4/5   Right Hip ADduction 4-/5   Left Hip Flexion 4-/5   Left Hip Extension 3+/5   Left Hip External Rotation 4-/5   Left Hip Internal Rotation 3+/5   Left Hip ABduction 4/5   Left Hip ADduction 3+/5   Right/Left Knee Right;Left   Right Knee Flexion 4/5   Right Knee Extension 4/5   Left Knee Flexion 4-/5   Left Knee Extension 4-/5   Right/Left Ankle Right;Left   Right Ankle Dorsiflexion 4/5   Right Ankle Plantar Flexion 4-/5   Right Ankle Inversion 4-/5  pain   Right Ankle Eversion 4-/5   Left Ankle Dorsiflexion 4/5   Left Ankle Plantar Flexion 3+/5   Left Ankle Inversion 4-/5  pain   Left Ankle Eversion 4-/5     Flexibility   Soft Tissue Assessment /Muscle Length yes   Hamstrings mild tight B   Quadriceps mod to severe tight B    ITB mild tight B   Piriformis mod to severe tight B  PT Education - 2016-09-27 1400    Education provided Yes   Education Details PT eval findings, anticipated POC & initial stretching HEP.   Person(s) Educated Patient   Methods Explanation;Demonstration;Handout   Comprehension Verbalized understanding;Returned demonstration;Need further instruction          PT Short Term Goals - 09/27/2016 1400      PT SHORT TERM GOAL #1   Title Independent with initial HEP by 09/29/16   Status New           PT Long Term Goals - September 27, 2016 1400      PT LONG TERM GOAL #1   Title Independent with advanced HEP +/- gym program by 10/27/16   Status New     PT LONG TERM GOAL #2   Title B LE flexibility WFL to allow functional AROM at knee and ankle by 10/27/16   Status New     PT LONG TERM GOAL #3   Title B LE strength >/= 4/5 for improved LE stability by 10/27/16   Status New     PT LONG TERM GOAL #4   Title Pt will report ability to complete daily mobility, ambulation and household chores w/o limitation due to L LE pain by 10/27/16   Status New               Plan - 09-27-2016 1400    Clinical Impression Statement Shakya is a 66 y/o female who presents to OP PT for a low complexity eval for L LE pain of ~ 3 months duration, primarily in distal lateral thigh and anterior lateral shin. Pt has h/o L LE DVTs, currently on Xarelto, but recent LE venous doppler US negative for DVT. Pt reporting pain has improved since placed on steroid pack, but concerned about pain returning now that steroids complete. Assessment reveals minor limitations in ankle and knee ROM with significantly limited flexibility in proximal LE musculature, especially quads and hip flexors. Mild to moderate weakness present throughout B LE, slightly more pronounced on L than R. PT will benefit from PT to focus on improving LE soft tissue pliability, restoring maximal L knee ROM and B DF ROM,  core/LE strengthening and stability training, with manual therapy and modalities as indicated to prevent recurrence of LE pain.   Rehab Potential Good   Clinical Impairments Affecting Rehab Potential h/o multiple DVT's & PE's on Xarelto   PT Frequency 2x / week   PT Duration 6 weeks   PT Treatment/Interventions Patient/family education;Therapeutic exercise;Neuromuscular re-education;Manual techniques;Taping;Dry needling;Electrical Stimulation;Moist Heat;Cryotherapy;Iontophoresis 4mg /ml Dexamethasone;ADLs/Self Care Home Management   Consulted and Agree with Plan of Care Patient      Patient will benefit from skilled therapeutic intervention in order to improve the following deficits and impairments:  Pain, Impaired flexibility, Decreased range of motion, Decreased strength, Increased muscle spasms, Increased fascial restricitons, Difficulty walking  Visit Diagnosis: Pain in left lower leg  Other symptoms and signs involving the musculoskeletal system  Muscle weakness (generalized)      G-Codes - September 27, 2016 1400    Functional Assessment Tool Used (Outpatient Only) Lower leg FOTO - 83% (17% limitation) + clinical judgement   Functional Limitation Mobility: Walking and moving around   Mobility: Walking and Moving Around Current Status (W6568) At least 20 percent but less than 40 percent impaired, limited or restricted   Mobility: Walking and Moving Around Goal Status (L2751) At least 20 percent but less than 40 percent impaired, limited or restricted       Problem List  Patient Active Problem List   Diagnosis Date Noted  . Diarrhea 12/02/2015  . History of colonic polyps 12/02/2015  . Abdominal pain, epigastric 12/02/2015  . PCP NOTES >>>>>>>>>>>>>>>>>>>>>>>>> 02/13/2015  . Anxiety 08/04/2014  . Annual physical exam 04/29/2014  . Leg pain, bilateral 02/19/2014  . Fibromyalgia syndrome 02/04/2014  . Dysphagia esophageal phase - chronic after 3 fundoplications 38/33/3832  . Cough  01/08/2014  . Chest pressure 01/08/2014  . DOE (dyspnea on exertion) 12/26/2013  . RUQ pain 11/16/2013  . Diabetes ([place]) 09/24/2013  . Abdominal pain, chronic, right flank -upper quadrant 07/02/2012  . Overweight(278.02) 09/30/2009  . HYPERSOMNIA, ASSOCIATED WITH SLEEP APNEA 09/28/2009  . Essential hypertension 06/24/2009  . DIZZINESS 06/24/2009  . HEADACHE 06/24/2009  . VENOUS INSUFFICIENCY, LEGS 11/19/2008  . KNEE PAIN 06/01/2008  . Acute pulmonary embolism ([place]) 05/19/2008  . Pulmonary nodule 08/26/2007  . Stricture and stenosis of esophagus 05/22/2007  . GERD 01/31/2007  . IBS (irritable bowel syndrome) 01/31/2007  . MITRAL VALVE PROLAPSE, HX OF 01/31/2007  . APPENDECTOMY, HX OF 01/31/2007    Percival Spanish, PT, MPT 09/11/2016, 6:01 PM  [facility] [address]  [place], [place], [address] Phone: [phone]   Fax:  [phone]  Name: EPSIE WALTHALL MRN: [id number] Date of Birth: [birth year]

## 2016-09-13 ENCOUNTER — Ambulatory Visit: Payer: Federal, State, Local not specified - PPO | Admitting: Physical Therapy

## 2016-09-13 DIAGNOSIS — M6281 Muscle weakness (generalized): Secondary | ICD-10-CM

## 2016-09-13 DIAGNOSIS — R29898 Other symptoms and signs involving the musculoskeletal system: Secondary | ICD-10-CM

## 2016-09-13 DIAGNOSIS — M79662 Pain in left lower leg: Secondary | ICD-10-CM | POA: Diagnosis not present

## 2016-09-13 NOTE — Therapy (Signed)
[facility] [address]  [place], [place], [address] Phone: [phone]   Fax:  [phone]  Physical Therapy Treatment  Patient Details  Name: Jennifer [name] MRN: [id number] Date of Birth: 28-Feb-1951 Referring Provider: Ames Coupe, DO  Encounter Date: 09/13/2016      PT End of Session - 09/13/16 1407    Visit Number 2   Number of Visits 12   Date for PT Re-Evaluation 10/27/16   Authorization Type Federal BCBS & [employer]   PT Start Time 1403   PT Stop Time 1502   PT Time Calculation (min) 59 min   Activity Tolerance Patient tolerated treatment well   Behavior During Therapy [facility] for tasks assessed/performed      Past Medical History:  Diagnosis Date  . Anemia   . Anxiety   . Chronic chest pain   . Colon polyps    inflamed partially ulcerated hyperplastic polyp  . Diabetes ([place]) 09/24/2013  . Diverticulosis   . Dysphagia esophageal phase - chronic after 3 fundoplications 12/09/5629  . Esophageal stenosis    Stricture after fundoplication REDO  . GERD (gastroesophageal reflux disease)   . Hemorrhoids   . Hypertension   . IBS (irritable bowel syndrome)   . Mitral valve prolapse   . OSA (obstructive sleep apnea)    on CPAP-Mild - Sleep study 2004  . Pulmonary embolus ([place])    2010 and 12-2013  . Shortness of breath    with exertion  . Tinnitus    Subjective  . Zoster 2014   R flank    Past Surgical History:  Procedure Laterality Date  . ABDOMINAL HYSTERECTOMY     still has 1 ovary   . APPENDECTOMY    . [place]   Stem surgery, Arnold Chiari Malformation  . BREAST REDUCTION SURGERY    . CATARACT EXTRACTION Bilateral 2017   cataracts, Dr. Herbert Deaner  . CHOLECYSTECTOMY  2008  . COLONOSCOPY    . ESOPHAGOGASTRODUODENOSCOPY    . HERNIA REPAIR     Hital Hernia  . KNEE ARTHROSCOPY  05/16/2012   Procedure: ARTHROSCOPY KNEE;  Surgeon: Sharmon Revere, MD;  Location: [facility];  Service:  Orthopedics;  Laterality: Left;  . NISSEN FUNDOPLICATION     X 3 lab, open x 2 last with mesh  . REDUCTION MAMMAPLASTY Bilateral 2007    There were no vitals filed for this visit.      Subjective Assessment - 09/13/16 1404    Subjective Has had some pain since eval; has done HEP distributed to her; goes to the gym on off days from PT   Pertinent History L knee arthroscopy 04/2012 (meniscal injury per pt report); h/o mutiple DVT's in L LE with PE   Diagnostic tests 08/24/16 LE venous doppler: No evidence of DVT within the left lower extremity.   Patient Stated Goals "to find out what's going on with my legs and fix it; get my legs strong"   Currently in Pain? Yes   Pain Score 3    Pain Location Leg   Pain Orientation Left;Lower;Anterior   Pain Descriptors / Indicators Aching;Sore   Pain Type Acute pain                         [facility] Adult PT Treatment/Exercise - 09/13/16 1409      Exercises   Exercises Knee/Hip;Ankle     Knee/Hip Exercises: Aerobic  Nustep L5 x 6 minutes     Knee/Hip Exercises: Supine   Bridges Both;15 reps   Straight Leg Raises Strengthening;Left;15 reps   Straight Leg Raises Limitations 2#     Modalities   Modalities Vasopneumatic     Vasopneumatic   Number Minutes Vasopneumatic  15 minutes   Vasopnuematic Location  --  lower leg   Vasopneumatic Pressure Medium   Vasopneumatic Temperature  coldest temp     Manual Therapy   Manual Therapy Soft tissue mobilization   Manual therapy comments patient supine   Soft tissue mobilization marshmallow stick to anterior lower leg and this - patient reporting some tightness/soreness throughout     Ankle Exercises: Stretches   Soleus Stretch 3 reps;30 seconds   Soleus Stretch Limitations manual   Gastroc Stretch 3 reps;30 seconds   Gastroc Stretch Limitations manual     Ankle Exercises: Seated   Other Seated Ankle Exercises 4 way resisted ankle - red tband x 15 each direction                   PT Short Term Goals - 09/11/16 1400      PT SHORT TERM GOAL #1   Title Independent with initial HEP by 09/29/16   Status New           PT Long Term Goals - 09/11/16 1400      PT LONG TERM GOAL #1   Title Independent with advanced HEP +/- gym program by 10/27/16   Status New     PT LONG TERM GOAL #2   Title B LE flexibility WFL to allow functional AROM at knee and ankle by 10/27/16   Status New     PT LONG TERM GOAL #3   Title B LE strength >/= 4/5 for improved LE stability by 10/27/16   Status New     PT LONG TERM GOAL #4   Title Pt will report ability to complete daily mobility, ambulation and household chores w/o limitation due to L LE pain by 10/27/16   Status New               Plan - 09/13/16 1408    Clinical Impression Statement Jennifer [name] doing well today - feels like she had some increased soreness following eval, however tolerable to all activities today. Good incorporation of ankle and proximal hip strengthening today with little issue. STM to anterior thigh and lower leg today with some tightness noted throughout. Vaso to end session with patient reporting good benefit.    PT Treatment/Interventions Patient/family education;Therapeutic exercise;Neuromuscular re-education;Manual techniques;Taping;Dry needling;Electrical Stimulation;Moist Heat;Cryotherapy;Iontophoresis 4mg /ml Dexamethasone;ADLs/Self Care Home Management   Consulted and Agree with Plan of Care Patient      Patient will benefit from skilled therapeutic intervention in order to improve the following deficits and impairments:  Pain, Impaired flexibility, Decreased range of motion, Decreased strength, Increased muscle spasms, Increased fascial restricitons, Difficulty walking  Visit Diagnosis: Pain in left lower leg  Other symptoms and signs involving the musculoskeletal system  Muscle weakness (generalized)     Problem List Patient Active Problem List   Diagnosis Date Noted   . Diarrhea 12/02/2015  . History of colonic polyps 12/02/2015  . Abdominal pain, epigastric 12/02/2015  . PCP NOTES >>>>>>>>>>>>>>>>>>>>>>>>> 02/13/2015  . Anxiety 08/04/2014  . Annual physical exam 04/29/2014  . Leg pain, bilateral 02/19/2014  . Fibromyalgia syndrome 02/04/2014  . Dysphagia esophageal phase - chronic after 3 fundoplications 60/63/0160  . Cough 01/08/2014  . Chest pressure  01/08/2014  . DOE (dyspnea on exertion) 12/26/2013  . RUQ pain 11/16/2013  . Diabetes ([place]) 09/24/2013  . Abdominal pain, chronic, right flank -upper quadrant 07/02/2012  . Overweight(278.02) 09/30/2009  . HYPERSOMNIA, ASSOCIATED WITH SLEEP APNEA 09/28/2009  . Essential hypertension 06/24/2009  . DIZZINESS 06/24/2009  . HEADACHE 06/24/2009  . VENOUS INSUFFICIENCY, LEGS 11/19/2008  . KNEE PAIN 06/01/2008  . Acute pulmonary embolism ([place]) 05/19/2008  . Pulmonary nodule 08/26/2007  . Stricture and stenosis of esophagus 05/22/2007  . GERD 01/31/2007  . IBS (irritable bowel syndrome) 01/31/2007  . MITRAL VALVE PROLAPSE, HX OF 01/31/2007  . APPENDECTOMY, HX OF 01/31/2007     Lanney Gins, PT, DPT 09/13/16 5:06 PM   [facility] [address]  [place], [place], [address] Phone: [phone]   Fax:  [phone]  Name: Jennifer [name] MRN: [id number] Date of Birth: 09/15/1950

## 2016-09-15 ENCOUNTER — Encounter: Payer: Self-pay | Admitting: Internal Medicine

## 2016-09-15 ENCOUNTER — Ambulatory Visit (INDEPENDENT_AMBULATORY_CARE_PROVIDER_SITE_OTHER): Payer: Federal, State, Local not specified - PPO | Admitting: Internal Medicine

## 2016-09-15 VITALS — BP 122/68 | HR 76 | Ht 68.0 in | Wt 255.3 lb

## 2016-09-15 DIAGNOSIS — K649 Unspecified hemorrhoids: Secondary | ICD-10-CM | POA: Diagnosis not present

## 2016-09-15 DIAGNOSIS — R1319 Other dysphagia: Secondary | ICD-10-CM

## 2016-09-15 DIAGNOSIS — R1310 Dysphagia, unspecified: Secondary | ICD-10-CM | POA: Diagnosis not present

## 2016-09-15 MED ORDER — HYDROCORTISONE ACETATE 25 MG RE SUPP: 25.0000 mg | Freq: Two times a day (BID) | RECTAL | 0 refills | Status: DC | PRN

## 2016-09-15 NOTE — Patient Instructions (Addendum)
If you are age 64 or older, your body mass index should be between 23-30. Your Body mass index is 38.82 kg/m. If this is out of the aforementioned range listed, please consider follow up with your Primary Care Provider.  If you are age 65 or younger, your body mass index should be between 19-25. Your Body mass index is 38.82 kg/m. If this is out of the aformentioned range listed, please consider follow up with your Primary Care Provider.   You have been scheduled for an endoscopy. Please follow written instructions given to you at your visit today. If you use inhalers (even only as needed), please bring them with you on the day of your procedure. Your physician has requested that you go to www.startemmi.com and enter the access code given to you at your visit today. This web site gives a general overview about your procedure. However, you should still follow specific instructions given to you by our office regarding your preparation for the procedure.  We have sent the following medications to your pharmacy for you to pick up at your convenience:  Hydrocortisone Suppository (If this is too expensive please call [name] back).  Please call the office back if you wish to scheduled hemorrhoid banding.  We will contact your hematologist in regards to you holding your Xarelto.  Thank you.

## 2016-09-15 NOTE — Progress Notes (Signed)
   Jennifer [name] 66 y.o. 10/28/1950 [id number]  Assessment & Plan:   Encounter Diagnoses  Name Primary?  . Esophageal dysphagia Yes  . Odynophagia   . Bleeding hemorrhoids     Her chronic symptoms are worse. Maybe there different she thinks they are. She could have some sort of superimposed infection. I decided to go ahead and repeat an upper endoscopy it's been a number of years since we've done that. Will consider balloon dilation up to 20 mm at the GE junction. I wonder if Botox injection might make a difference for this lady though I have my doubts. Other studies could include barium swallow versus manometry. To complicated situation with 3 prior fundoplications and mesh insertion. All of which may be causing some problems. She reminds me again today that she did not have these type of symptoms until after the third surgery at least so she remembers.  Would like to hold Xarelto one day prior that should be enough to let it wear off. Reduce the risk of blood clot off anticoagulation. Other normal endoscopy risks of bleeding infection perforation medication reaction etc. explained  Hydrocortisone suppositories for hemorrhoids. Consider hemorrhoidal banding though would need to hold Xarelto for about a week.  I appreciate the opportunity to care for this patient. CC: Colon Branch, MD   Subjective:   Chief Complaint:Painful swallowing dysphagia rectal bleeding  HPI Jennifer [name] this year her problems have flared. She's had chronic dysphagia intermittent odynophagia had tolerated it for a while but she says things are worsening different I guess more frequent. She's quite bothered. She is interested in an evaluation and possible dilation again. She's been coughing up of the mucus and wonders if that's related to her GI issues. No postnasal drip. Denies seasonal allergies. Having some hoarseness  As a history of chronic intermittent rectal bleeding recent colonoscopy up-to-date, this is reviewed  in the chart. She has a history of hemorrhoids. Symptoms are compatible with that with blood in the bowl or wiping. Has used hydrocortisone suppositories in the past with relief and is requesting those again. Bleeding might be low but worse she is on chronic Xarelto because of the history of DVT and pulmonary emboli.  Medications, allergies, past medical history, past surgical history, family history and social history are reviewed and updated in the EMR.  Review of Systems As above.  Objective:   Physical Exam BP 122/68 (BP Location: Left Arm, Patient Position: Sitting, Cuff Size: Large)   Pulse 76   Ht 5\' 8"  (1.727 m)   Wt 255 lb 5 oz (115.8 kg)   BMI 38.82 kg/m  NAD Eyes anicteric Neck no mass TM andnontender Ht s1s2 no rmg Lungs clear Abdomen soft NT

## 2016-09-19 ENCOUNTER — Ambulatory Visit: Payer: Federal, State, Local not specified - PPO | Admitting: Rehabilitation

## 2016-09-19 ENCOUNTER — Telehealth: Payer: Self-pay

## 2016-09-19 NOTE — Telephone Encounter (Signed)
Pt informed if Dr. Antonieta Pert response

## 2016-09-19 NOTE — Telephone Encounter (Signed)
-----   Message from [name], MD sent at 09/16/2016  7:25 AM EDT ----- Caryl Pina:  I would just have her off Xarelto 2 days before the procedure!!!  Restart the Xarelto day after her procedure!!  Have a very blessed Memorial Day weekend!!  Laurey Arrow ----- Message ----- From: Alphonzo Dublin, LPN Sent: 5/61/5379   2:48 PM To: [name], MD

## 2016-09-21 ENCOUNTER — Ambulatory Visit: Payer: Federal, State, Local not specified - PPO | Admitting: Physical Therapy

## 2016-09-21 DIAGNOSIS — M79662 Pain in left lower leg: Secondary | ICD-10-CM | POA: Diagnosis not present

## 2016-09-21 DIAGNOSIS — M6281 Muscle weakness (generalized): Secondary | ICD-10-CM

## 2016-09-21 DIAGNOSIS — R29898 Other symptoms and signs involving the musculoskeletal system: Secondary | ICD-10-CM

## 2016-09-21 NOTE — Therapy (Signed)
[facility] [address]  [place], [place], [address] Phone: [phone]   Fax:  [phone]  Physical Therapy Treatment  Patient Details  Name: DYLYNN KETNER MRN: [id number] Date of Birth: [birth year] Referring Provider: Ames Coupe, DO  Encounter Date: 09/21/2016      PT End of Session - 09/21/16 1311    Visit Number 3   Number of Visits 12   Date for PT Re-Evaluation 10/27/16   Authorization Type [employer]   PT Start Time 1311   PT Stop Time 1416   PT Time Calculation (min) 65 min   Activity Tolerance Patient tolerated treatment well   Behavior During Therapy [facility] for tasks assessed/performed      Past Medical History:  Diagnosis Date  . Anemia   . Anxiety   . Chronic chest pain   . Colon polyps    inflamed partially ulcerated hyperplastic polyp  . Diabetes ([place]) 09/24/2013  . Diverticulosis   . Dysphagia esophageal phase - chronic after 3 fundoplications 2/77/4128  . Esophageal stenosis    Stricture after fundoplication REDO  . GERD (gastroesophageal reflux disease)   . Hemorrhoids   . Hypertension   . IBS (irritable bowel syndrome)   . Mitral valve prolapse   . OSA (obstructive sleep apnea)    on CPAP-Mild - Sleep study 2004  . Pulmonary embolus ([place])    2010 and 12-2013  . Shortness of breath    with exertion  . Tinnitus    Subjective  . Zoster 2014   R flank    Past Surgical History:  Procedure Laterality Date  . ABDOMINAL HYSTERECTOMY     still has 1 ovary   . APPENDECTOMY    . [place]   Stem surgery, Arnold Chiari Malformation  . BREAST REDUCTION SURGERY    . CATARACT EXTRACTION Bilateral 2017   cataracts, Dr. Herbert Deaner  . CHOLECYSTECTOMY  2008  . COLONOSCOPY    . ESOPHAGOGASTRODUODENOSCOPY    . HERNIA REPAIR     Hital Hernia  . KNEE ARTHROSCOPY  05/16/2012   Procedure: ARTHROSCOPY KNEE;  Surgeon: Sharmon Revere, MD;  Location: [facility];  Service:  Orthopedics;  Laterality: Left;  . NISSEN FUNDOPLICATION     X 3 lab, open x 2 last with mesh  . REDUCTION MAMMAPLASTY Bilateral 2007    There were no vitals filed for this visit.      Subjective Assessment - 09/21/16 1315    Subjective Pt feels like the pain is starting to come back now that the steroids are wearing off.   Pertinent History L knee arthroscopy 04/2012 (meniscal injury per pt report); h/o mutiple DVT's in L LE with PE   Diagnostic tests 08/24/16 LE venous doppler: No evidence of DVT within the left lower extremity.   Patient Stated Goals "to find out what's going on with my legs and fix it; get my legs strong"   Currently in Pain? Yes   Pain Score 6    Pain Location Leg   Pain Orientation Left;Lower;Anterior   Pain Descriptors / Indicators Aching;Sore   Pain Type Acute pain                         [facility] Adult PT Treatment/Exercise - 09/21/16 1311      Knee/Hip Exercises: Stretches   Passive Hamstring Stretch Left;30 seconds;2 reps   Passive Hamstring Stretch Limitations  supine with strap + gastroc     Knee/Hip Exercises: Aerobic   Nustep L5 x 6 minutes     Knee/Hip Exercises: Supine   Bridges with Ball Squeeze Both;10 reps   Other Supine Knee/Hip Exercises Alt hip ABD/ER with red TB x15     Modalities   Modalities Vasopneumatic     Vasopneumatic   Number Minutes Vasopneumatic  15 minutes   Vasopnuematic Location  --  lower leg   Vasopneumatic Pressure Medium   Vasopneumatic Temperature  coldest temp     Manual Therapy   Manual Therapy Soft tissue mobilization;Myofascial release;Taping   Manual therapy comments pt hooklying & R sidelying   Soft tissue mobilization STM & IASTM with roller stick and 4" ball to L anterior lower leg & L distal lateral HS   Myofascial Release TPR to L distal lateral HS   Kinesiotex Inhibit Muscle     Kinesiotix   Inhibit Muscle  L anterior tibialis - 30% from dorsum of foot to tibial tuberosity, 2 - 50%  perpendicular strips at areas of greatest pain/tenderness                  PT Short Term Goals - 09/21/16 1317      PT SHORT TERM GOAL #1   Title Independent with initial HEP by 09/29/16   Status On-going           PT Long Term Goals - 09/21/16 1318      PT LONG TERM GOAL #1   Title Independent with advanced HEP +/- gym program by 10/27/16   Status On-going     PT LONG TERM GOAL #2   Title B LE flexibility WFL to allow functional AROM at knee and ankle by 10/27/16   Status On-going     PT LONG TERM GOAL #3   Title B LE strength >/= 4/5 for improved LE stability by 10/27/16   Status On-going     PT LONG TERM GOAL #4   Title Pt will report ability to complete daily mobility, ambulation and household chores w/o limitation due to L LE pain by 10/27/16   Status On-going               Plan - 09/21/16 1318    Clinical Impression Statement Pt feeling like pain has been worsening as steriod pack effects are wearing off. She continues to demonstrate abnormal increased muscle tension in anterior distal lower extremity musculature and distal lateral HS on L. Limted tolerance for STM & IASTM noting increased soreness after IASTM last visit, therefore only utilized minimal IASTM and focused mostly on manual STM. Initiated trial of kinesiotaping to L anterior lower leg in shin splint pattern and if benefit noted, may also consider taping for HS and/or posterior calf in the future.   Rehab Potential Good   Clinical Impairments Affecting Rehab Potential h/o multiple DVT's & PE's on Xarelto   PT Treatment/Interventions Patient/family education;Therapeutic exercise;Neuromuscular re-education;Manual techniques;Taping;Dry needling;Electrical Stimulation;Moist Heat;Cryotherapy;Iontophoresis 4mg /ml Dexamethasone;ADLs/Self Care Home Management   PT Next Visit Plan Assess response to taping; manual therapy to address increased muscle tension + taping as indicated & benefit noted; LE  strengthening   Consulted and Agree with Plan of Care Patient      Patient will benefit from skilled therapeutic intervention in order to improve the following deficits and impairments:  Pain, Impaired flexibility, Decreased range of motion, Decreased strength, Increased muscle spasms, Increased fascial restricitons, Difficulty walking  Visit Diagnosis: Pain in left lower  leg  Other symptoms and signs involving the musculoskeletal system  Muscle weakness (generalized)     Problem List Patient Active Problem List   Diagnosis Date Noted  . Diarrhea 12/02/2015  . History of colonic polyps 12/02/2015  . Abdominal pain, epigastric 12/02/2015  . PCP NOTES >>>>>>>>>>>>>>>>>>>>>>>>> 02/13/2015  . Anxiety 08/04/2014  . Annual physical exam 04/29/2014  . Leg pain, bilateral 02/19/2014  . Fibromyalgia syndrome 02/04/2014  . Dysphagia esophageal phase - chronic after 3 fundoplications 02/20/1313  . Cough 01/08/2014  . Chest pressure 01/08/2014  . DOE (dyspnea on exertion) 12/26/2013  . RUQ pain 11/16/2013  . Diabetes ([place]) 09/24/2013  . Abdominal pain, chronic, right flank -upper quadrant 07/02/2012  . Overweight(278.02) 09/30/2009  . HYPERSOMNIA, ASSOCIATED WITH SLEEP APNEA 09/28/2009  . Essential hypertension 06/24/2009  . DIZZINESS 06/24/2009  . HEADACHE 06/24/2009  . VENOUS INSUFFICIENCY, LEGS 11/19/2008  . KNEE PAIN 06/01/2008  . Acute pulmonary embolism ([place]) 05/19/2008  . Pulmonary nodule 08/26/2007  . Stricture and stenosis of esophagus 05/22/2007  . GERD 01/31/2007  . IBS (irritable bowel syndrome) 01/31/2007  . MITRAL VALVE PROLAPSE, HX OF 01/31/2007  . APPENDECTOMY, HX OF 01/31/2007    Percival Spanish, PT, MPT 09/21/2016, 3:12 PM  [facility] [address]  [place], [place], [address] Phone: [phone]   Fax:  [phone]  Name: ROSSIE SCARFONE MRN: [id number] Date of Birth: May 13, 1950

## 2016-09-25 ENCOUNTER — Ambulatory Visit: Payer: Federal, State, Local not specified - PPO | Attending: Family Medicine | Admitting: Physical Therapy

## 2016-09-25 VITALS — BP 138/66 | HR 71

## 2016-09-25 DIAGNOSIS — M6281 Muscle weakness (generalized): Secondary | ICD-10-CM | POA: Diagnosis present

## 2016-09-25 DIAGNOSIS — M79662 Pain in left lower leg: Secondary | ICD-10-CM | POA: Diagnosis not present

## 2016-09-25 DIAGNOSIS — R29898 Other symptoms and signs involving the musculoskeletal system: Secondary | ICD-10-CM | POA: Insufficient documentation

## 2016-09-25 NOTE — Patient Instructions (Signed)
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
## 2016-09-27 ENCOUNTER — Ambulatory Visit: Payer: Federal, State, Local not specified - PPO | Admitting: Physical Therapy

## 2016-09-27 DIAGNOSIS — M79662 Pain in left lower leg: Secondary | ICD-10-CM

## 2016-09-27 DIAGNOSIS — R29898 Other symptoms and signs involving the musculoskeletal system: Secondary | ICD-10-CM

## 2016-09-27 DIAGNOSIS — M6281 Muscle weakness (generalized): Secondary | ICD-10-CM

## 2016-09-27 NOTE — Therapy (Signed)
[facility] [address]  [place], [place], [address] Phone: [phone]   Fax:  [phone]  Physical Therapy Treatment  Patient Details  Name: Jennifer [name] MRN: [id number] Date of Birth: 16-Nov-1950 Referring Provider: Ames Coupe, DO  Encounter Date: 09/27/2016      PT End of Session - 09/27/16 1430    Visit Number 5   Number of Visits 12   Date for PT Re-Evaluation 10/27/16   Authorization Type [place]   PT Start Time 1322  pt late   PT Stop Time 1415  ice at end of session   PT Time Calculation (min) 53 min   Activity Tolerance Patient tolerated treatment well   Behavior During Therapy [facility] for tasks assessed/performed      Past Medical History:  Diagnosis Date  . Anemia   . Anxiety   . Chronic chest pain   . Colon polyps    inflamed partially ulcerated hyperplastic polyp  . Diabetes ([place]) 09/24/2013  . Diverticulosis   . Dysphagia esophageal phase - chronic after 3 fundoplications 5/92/9244  . Esophageal stenosis    Stricture after fundoplication REDO  . GERD (gastroesophageal reflux disease)   . Hemorrhoids   . Hypertension   . IBS (irritable bowel syndrome)   . Mitral valve prolapse   . OSA (obstructive sleep apnea)    on CPAP-Mild - Sleep study 2004  . Pulmonary embolus ([place])    2010 and 12-2013  . Shortness of breath    with exertion  . Tinnitus    Subjective  . Zoster 2014   R flank    Past Surgical History:  Procedure Laterality Date  . ABDOMINAL HYSTERECTOMY     still has 1 ovary   . APPENDECTOMY    . [place]   Stem surgery, Arnold Chiari Malformation  . BREAST REDUCTION SURGERY    . CATARACT EXTRACTION Bilateral 2017   cataracts, Dr. Herbert Deaner  . CHOLECYSTECTOMY  2008  . COLONOSCOPY    . ESOPHAGOGASTRODUODENOSCOPY    . HERNIA REPAIR     Hital Hernia  . KNEE ARTHROSCOPY  05/16/2012   Procedure: ARTHROSCOPY KNEE;  Surgeon: Sharmon Revere, MD;   Location: [facility];  Service: Orthopedics;  Laterality: Left;  . NISSEN FUNDOPLICATION     X 3 lab, open x 2 last with mesh  . REDUCTION MAMMAPLASTY Bilateral 2007    There were no vitals filed for this visit.      Subjective Assessment - 09/27/16 1341    Subjective Taping is helping some. Unsire about dry needling.    Pertinent History L knee arthroscopy 04/2012 (meniscal injury per pt report); h/o mutiple DVT's in L LE with PE   Diagnostic tests 08/24/16 LE venous doppler: No evidence of DVT within the left lower extremity.   Patient Stated Goals "to find out what's going on with my legs and fix it; get my legs strong"   Currently in Pain? Yes   Pain Score 5    Pain Location Leg   Pain Orientation Left;Anterior;Lower   Pain Descriptors / Indicators Aching;Sore   Pain Type Acute pain                         [facility] Adult PT Treatment/Exercise - 09/27/16 0001      Knee/Hip Exercises: Stretches   Passive Hamstring Stretch Left;30 seconds;2 reps   Passive Hamstring Stretch  Limitations manual by PT   Gastroc Stretch Left;3 reps;30 seconds   Gastroc Stretch Limitations manual by PT     Knee/Hip Exercises: Aerobic   Nustep L6 x 6 minutes     Knee/Hip Exercises: Supine   Straight Leg Raises Strengthening;Left;15 reps   Straight Leg Raises Limitations 2#     Modalities   Modalities Cryotherapy     Cryotherapy   Number Minutes Cryotherapy 15 Minutes   Cryotherapy Location --  L lower leg   Type of Cryotherapy Ice pack     Manual Therapy   Manual Therapy Soft tissue mobilization   Manual therapy comments pt supine   Soft tissue mobilization STM to L tib anterior & gastroc     Ankle Exercises: Supine   Other Supine Ankle Exercises 4 way resisted ankle - green tband x 15 reps                  PT Short Term Goals - 09/25/16 1328      PT SHORT TERM GOAL #1   Title Independent with initial HEP by 09/29/16   Status Achieved           PT Long Term  Goals - 09/21/16 1318      PT LONG TERM GOAL #1   Title Independent with advanced HEP +/- gym program by 10/27/16   Status On-going     PT LONG TERM GOAL #2   Title B LE flexibility WFL to allow functional AROM at knee and ankle by 10/27/16   Status On-going     PT LONG TERM GOAL #3   Title B LE strength >/= 4/5 for improved LE stability by 10/27/16   Status On-going     PT LONG TERM GOAL #4   Title Pt will report ability to complete daily mobility, ambulation and household chores w/o limitation due to L LE pain by 10/27/16   Status On-going               Plan - 09/27/16 1431    Clinical Impression Statement Patient doing well today - some reported benefit from taping. Heavy education regarding dry needling technique with risks and benefits outlines. Patient, at this time, wishing to further think about this treatment intervention, with PT supporting this decision. Will plan to continue to progress as tolerated.    PT Treatment/Interventions Patient/family education;Therapeutic exercise;Neuromuscular re-education;Manual techniques;Taping;Dry needling;Electrical Stimulation;Moist Heat;Cryotherapy;Iontophoresis 4mg /ml Dexamethasone;ADLs/Self Care Home Management   PT Next Visit Plan possible trial of dry needling to L lower leg; manual therapy to address increased muscle tension + taping as indicated & benefit noted; LE strengthening   Consulted and Agree with Plan of Care Patient      Patient will benefit from skilled therapeutic intervention in order to improve the following deficits and impairments:  Pain, Impaired flexibility, Decreased range of motion, Decreased strength, Increased muscle spasms, Increased fascial restricitons, Difficulty walking  Visit Diagnosis: Pain in left lower leg  Other symptoms and signs involving the musculoskeletal system  Muscle weakness (generalized)     Problem List Patient Active Problem List   Diagnosis Date Noted  . Diarrhea 12/02/2015  .  History of colonic polyps 12/02/2015  . Abdominal pain, epigastric 12/02/2015  . PCP NOTES >>>>>>>>>>>>>>>>>>>>>>>>> 02/13/2015  . Anxiety 08/04/2014  . Annual physical exam 04/29/2014  . Leg pain, bilateral 02/19/2014  . Fibromyalgia syndrome 02/04/2014  . Dysphagia esophageal phase - chronic after 3 fundoplications 17/51/0258  . Cough 01/08/2014  . Chest pressure 01/08/2014  .  DOE (dyspnea on exertion) 12/26/2013  . RUQ pain 11/16/2013  . Diabetes ([place]) 09/24/2013  . Abdominal pain, chronic, right flank -upper quadrant 07/02/2012  . Overweight(278.02) 09/30/2009  . HYPERSOMNIA, ASSOCIATED WITH SLEEP APNEA 09/28/2009  . Essential hypertension 06/24/2009  . DIZZINESS 06/24/2009  . HEADACHE 06/24/2009  . VENOUS INSUFFICIENCY, LEGS 11/19/2008  . KNEE PAIN 06/01/2008  . Acute pulmonary embolism ([place]) 05/19/2008  . Pulmonary nodule 08/26/2007  . Stricture and stenosis of esophagus 05/22/2007  . GERD 01/31/2007  . IBS (irritable bowel syndrome) 01/31/2007  . MITRAL VALVE PROLAPSE, HX OF 01/31/2007  . APPENDECTOMY, HX OF 01/31/2007     Lanney Gins, PT, DPT 09/27/16 2:35 PM   [facility] [address]  [place], [place], [address] Phone: [phone]   Fax:  [phone]  Name: Jennifer [name] MRN: [id number] Date of Birth: [birth year]

## 2016-09-28 ENCOUNTER — Encounter: Payer: Self-pay | Admitting: Internal Medicine

## 2016-09-28 ENCOUNTER — Ambulatory Visit (AMBULATORY_SURGERY_CENTER): Payer: Federal, State, Local not specified - PPO | Admitting: Internal Medicine

## 2016-09-28 ENCOUNTER — Encounter: Payer: Federal, State, Local not specified - PPO | Admitting: Internal Medicine

## 2016-09-28 VITALS — BP 125/73 | HR 66 | Temp 97.3°F | Resp 16 | Ht 68.0 in | Wt 255.0 lb

## 2016-09-28 DIAGNOSIS — R1310 Dysphagia, unspecified: Secondary | ICD-10-CM | POA: Diagnosis present

## 2016-09-28 MED ORDER — SODIUM CHLORIDE 0.9 % IV SOLN: 500.0000 mL | INTRAVENOUS | Status: DC

## 2016-09-28 NOTE — Patient Instructions (Addendum)
Things looked ok - I dilated with a balloon to 20 mm. I hope that helps - let me know.  Restart Xarelto today/tonight.  I appreciate the opportunity to care for you. YOU HAD AN ENDOSCOPIC PROCEDURE TODAY AT [facility]:   Refer to the procedure report that was given to you for any specific questions about what was found during the examination.  If the procedure report does not answer your questions, please call your gastroenterologist to clarify.  If you requested that your care partner not be given the details of your procedure findings, then the procedure report has been included in a sealed envelope for you to review at your convenience later.  YOU SHOULD EXPECT: Some feelings of bloating in the abdomen. Passage of more gas than usual.  Walking can help get rid of the air that was put into your GI tract during the procedure and reduce the bloating. If you had a lower endoscopy (such as a colonoscopy or flexible sigmoidoscopy) you may notice spotting of blood in your stool or on the toilet paper. If you underwent a bowel prep for your procedure, you may not have a normal bowel movement for a few days.  Please Note:  You might notice some irritation and congestion in your nose or some drainage.  This is from the oxygen used during your procedure.  There is no need for concern and it should clear up in a day or so.  SYMPTOMS TO REPORT IMMEDIATELY:   Following lower endoscopy (colonoscopy or flexible sigmoidoscopy):  Excessive amounts of blood in the stool  Significant tenderness or worsening of abdominal pains  Swelling of the abdomen that is new, acute  Fever of 100F or higher   Following upper endoscopy (EGD)  Vomiting of blood or coffee ground material  New chest pain or pain under the shoulder blades  Painful or persistently difficult swallowing  New shortness of breath  Fever of 100F or higher  Black, tarry-looking stools  For urgent or emergent issues,  a gastroenterologist can be reached at any hour by calling [phone].   DIET:  Follow post dilatation diet .  Drink plenty of fluids but you should avoid alcoholic beverages for 24 hours.  ACTIVITY:  You should plan to take it easy for the rest of today and you should NOT DRIVE or use heavy machinery until tomorrow (because of the sedation medicines used during the test).    FOLLOW UP: Our staff will call the number listed on your records the next business day following your procedure to check on you and address any questions or concerns that you may have regarding the information given to you following your procedure. If we do not reach you, we will leave a message.  However, if you are feeling well and you are not experiencing any problems, there is no need to return our call.  We will assume that you have returned to your regular daily activities without incident.  If any biopsies were taken you will be contacted by phone or by letter within the next 1-3 weeks.  Please call us at [phone] if you have not heard about the biopsies in 3 weeks.   Post Esophageal Dilation Diet (handout given) Esophagitis and Stricture (handout given)   SIGNATURES/CONFIDENTIALITY: You and/or your care partner have signed paperwork which will be entered into your electronic medical record.  These signatures attest to the fact that that the information above on your After Visit Summary  has been reviewed and is understood.  Full responsibility of the confidentiality of this discharge information lies with you and/or your care-partner. 

## 2016-09-28 NOTE — Progress Notes (Signed)
Dental advisory given to patient 

## 2016-09-28 NOTE — Progress Notes (Signed)
Called to room to assist during endoscopic procedure.  Patient ID and intended procedure confirmed with present staff. Received instructions for my participation in the procedure from the performing physician.  

## 2016-09-28 NOTE — Progress Notes (Signed)
Alert and oriented x3, pleased with MAC, report to RN SarahDental advisory given to patient

## 2016-09-28 NOTE — Op Note (Signed)
[facility] Patient Name: Jennifer [name] Procedure Date: 09/28/2016 11:32 AM MRN: [id number] Endoscopist: Gatha Mayer , MD Age: 66 Referring MD:  Date of Birth: October 27, 1950 Gender: Female Account #: [id number] Procedure:                Upper GI endoscopy Indications:              Dysphagia Medicines:                Propofol per Anesthesia, Monitored Anesthesia Care Procedure:                Pre-Anesthesia Assessment:                           - Prior to the procedure, a History and Physical                            was performed, and patient medications and                            allergies were reviewed. The patient's tolerance of                            previous anesthesia was also reviewed. The risks                            and benefits of the procedure and the sedation                            options and risks were discussed with the patient.                            All questions were answered, and informed consent                            was obtained. Prior Anticoagulants: The patient                            last took Xarelto (rivaroxaban) 1 day prior to the                            procedure. ASA Grade Assessment: III - A patient                            with severe systemic disease. After reviewing the                            risks and benefits, the patient was deemed in                            satisfactory condition to undergo the procedure.                           After obtaining informed consent, the endoscope was  passed under direct vision. Throughout the                            procedure, the patient's blood pressure, pulse, and                            oxygen saturations were monitored continuously. The                            Model GIF-[id number]) scope was introduced                            through the mouth, and advanced to the second part                            of duodenum. The  upper GI endoscopy was                            accomplished without difficulty. The patient                            tolerated the procedure well. Scope In: Scope Out: Findings:                 Evidence of a Nissen fundoplication was found in                            the cardia. The wrap appeared tight. This was                            traversed. A TTS dilator was passed through the                            scope. Dilation with an 18-19-20 mm balloon dilator                            was performed to 20 mm. The dilateor was chnaged to                            18 mm and pullback technique used to dilate                            remaining esophagus to but not in the UES                           The exam was otherwise without abnormality. Complications:            No immediate complications. Estimated Blood Loss:     Estimated blood loss: none. Impression:               - A Nissen fundoplication was found. The wrap                            appears tight. Dilated.                           -  The examination was otherwise normal.                           - No specimens collected. Recommendation:           - Patient has a contact number available for                            emergencies. The signs and symptoms of potential                            delayed complications were discussed with the                            patient. Return to normal activities tomorrow.                            Written discharge instructions were provided to the                            patient.                           - Resume Xarelto (rivaroxaban) at prior dose today.                           - Repeat upper endoscopy PRN for retreatment.                           - Clear liquids x 1 hour then soft foods rest of                            day. Start prior diet tomorrow [Duration]. Gatha Mayer, MD 09/28/2016 11:53:56 AM This report has been signed electronically.

## 2016-09-29 ENCOUNTER — Telehealth: Payer: Self-pay

## 2016-09-29 ENCOUNTER — Other Ambulatory Visit: Payer: Self-pay | Admitting: Family Medicine

## 2016-09-29 NOTE — Telephone Encounter (Signed)
Rx printed, awaiting MD signature.  

## 2016-09-29 NOTE — Telephone Encounter (Signed)
Rx faxed to [facility] pharmacy.  

## 2016-09-29 NOTE — Telephone Encounter (Signed)
Pt is requesting refill on alprazolam 0.5mg   Last OV: 05/23/2016 Last Fill: 10/25/2015 #21 and 0RF UDS: 11/19/2015 Low risk  Please advise.

## 2016-09-29 NOTE — Telephone Encounter (Signed)
No answer, left voicemail

## 2016-09-29 NOTE — Telephone Encounter (Signed)
Okay #30, no refills 

## 2016-09-29 NOTE — Telephone Encounter (Signed)
  Follow up Call-  Call back number 09/28/2016 12/14/2015 01/29/2014  Post procedure Call Back phone  # [phone]  Permission to leave phone message Yes Yes Yes  Some recent data might be hidden     Patient questions:  Do you have a fever, pain , or abdominal swelling? No. Pain Score  0 *  Have you tolerated food without any problems? No.  Have you been able to return to your normal activities? Yes.    Do you have any questions about your discharge instructions: Diet   Yes.   Medications  No. Follow up visit  No.  Do you have questions or concerns about your Care? No.  Actions: * If pain score is 4 or above: No action needed, pain <4. Pt verbalize he throat was sore. She has not advance to a regular diet. Pt verbalize she is doing a warm water and salt gargle. Advised if no improvement or gets worse to call us.

## 2016-10-02 ENCOUNTER — Ambulatory Visit: Payer: Federal, State, Local not specified - PPO | Admitting: Physical Therapy

## 2016-10-04 ENCOUNTER — Ambulatory Visit: Payer: Federal, State, Local not specified - PPO | Admitting: Physical Therapy

## 2016-10-04 DIAGNOSIS — M79662 Pain in left lower leg: Secondary | ICD-10-CM | POA: Diagnosis not present

## 2016-10-04 DIAGNOSIS — R29898 Other symptoms and signs involving the musculoskeletal system: Secondary | ICD-10-CM

## 2016-10-04 DIAGNOSIS — M6281 Muscle weakness (generalized): Secondary | ICD-10-CM

## 2016-10-04 NOTE — Therapy (Signed)
[facility] [address]  [place], [place], [address] Phone: [phone]   Fax:  [phone]  Physical Therapy Treatment  Patient Details  Name: Jennifer [name] MRN: [id number] Date of Birth: 12-Dec-1950 Referring Provider: Ames Coupe, DO  Encounter Date: 10/04/2016      PT End of Session - 10/04/16 1323    Visit Number 6   Number of Visits 12   Date for PT Re-Evaluation 10/27/16   Authorization Type [employer]   PT Start Time 1320   PT Stop Time 1422   PT Time Calculation (min) 62 min   Activity Tolerance Patient tolerated treatment well   Behavior During Therapy [facility] for tasks assessed/performed      Past Medical History:  Diagnosis Date  . Anemia   . Anxiety   . Chronic chest pain   . Colon polyps    inflamed partially ulcerated hyperplastic polyp  . Diabetes ([place]) 09/24/2013  . Diverticulosis   . Dysphagia esophageal phase - chronic after 3 fundoplications 6/96/2952  . Esophageal stenosis    Stricture after fundoplication REDO  . GERD (gastroesophageal reflux disease)   . Hemorrhoids   . Hypertension   . IBS (irritable bowel syndrome)   . Mitral valve prolapse   . OSA (obstructive sleep apnea)    on CPAP-Mild - Sleep study 2004  . Pulmonary embolus ([place])    2010 and 12-2013  . Shortness of breath    with exertion  . Sleep apnea    wears CPAP  . Tinnitus    Subjective  . Zoster 2014   R flank    Past Surgical History:  Procedure Laterality Date  . ABDOMINAL HYSTERECTOMY     still has 1 ovary   . APPENDECTOMY    . [place]   Stem surgery, Arnold Chiari Malformation  . BREAST REDUCTION SURGERY    . CATARACT EXTRACTION Bilateral 2017   cataracts, Dr. Herbert Deaner  . CHOLECYSTECTOMY  2008  . COLONOSCOPY    . ESOPHAGOGASTRODUODENOSCOPY    . HERNIA REPAIR     Hital Hernia  . KNEE ARTHROSCOPY  05/16/2012   Procedure: ARTHROSCOPY KNEE;  Surgeon: Sharmon Revere, MD;   Location: [facility];  Service: Orthopedics;  Laterality: Left;  . NISSEN FUNDOPLICATION     X 3 lab, open x 2 last with mesh  . REDUCTION MAMMAPLASTY Bilateral 2007    There were no vitals filed for this visit.      Subjective Assessment - 10/04/16 1322    Subjective Some tenderness in anterior lower leg   Pertinent History L knee arthroscopy 04/2012 (meniscal injury per pt report); h/o mutiple DVT's in L LE with PE   Diagnostic tests 08/24/16 LE venous doppler: No evidence of DVT within the left lower extremity.   Patient Stated Goals "to find out what's going on with my legs and fix it; get my legs strong"   Currently in Pain? Yes   Pain Score 3    Pain Location Leg   Pain Orientation Left;Anterior;Lower   Pain Descriptors / Indicators Throbbing;Aching   Pain Type Chronic pain                         [facility] Adult PT Treatment/Exercise - 10/04/16 1326      Knee/Hip Exercises: Stretches   Passive Hamstring Stretch Left;3 reps;30 seconds   Passive Hamstring Stretch Limitations supine with  strap - VC for gentle stretch     Knee/Hip Exercises: Aerobic   Nustep L6 x 6 minutes     Knee/Hip Exercises: Standing   Heel Raises Both;15 reps   Heel Raises Limitations progression to L LE eccentric x 10 reps   Functional Squat 15 reps   Functional Squat Limitations B UE support on chair     Knee/Hip Exercises: Supine   Straight Leg Raises Strengthening;Left;15 reps   Straight Leg Raises Limitations 3#   Straight Leg Raise with External Rotation Strengthening;Left;10 reps   Straight Leg Raise with External Rotation Limitations 3#     Vasopneumatic   Number Minutes Vasopneumatic  15 minutes   Vasopnuematic Location  --  lower leg   Vasopneumatic Pressure Medium   Vasopneumatic Temperature  coldest temp     Manual Therapy   Manual Therapy Soft tissue mobilization;Myofascial release   Manual therapy comments pt supine   Soft tissue mobilization STM to L anterior tib,  gastroc, distal hamstring   Myofascial Release manual trigger point release to distal hamstring, anterior tib, lasteral gastroc                  PT Short Term Goals - 09/25/16 1328      PT SHORT TERM GOAL #1   Title Independent with initial HEP by 09/29/16   Status Achieved           PT Long Term Goals - 09/21/16 1318      PT LONG TERM GOAL #1   Title Independent with advanced HEP +/- gym program by 10/27/16   Status On-going     PT LONG TERM GOAL #2   Title B LE flexibility WFL to allow functional AROM at knee and ankle by 10/27/16   Status On-going     PT LONG TERM GOAL #3   Title B LE strength >/= 4/5 for improved LE stability by 10/27/16   Status On-going     PT LONG TERM GOAL #4   Title Pt will report ability to complete daily mobility, ambulation and household chores w/o limitation due to L LE pain by 10/27/16   Status On-going               Plan - 10/04/16 1325    Clinical Impression Statement Ms. Ocanas well today - has been going to the gym regularly for upper body work with L LE not interferring. Does continue to have soreness of L lower leg with taut bands and trigger points noted throughout anterior tib, lateral gastroc, and distal hamstring. Patient doing well with progression to standing ther ex today. Patient reporting she will consider DN when she returns from vacation.    Clinical Impairments Affecting Rehab Potential h/o multiple DVT's & PE's on Xarelto   PT Treatment/Interventions Patient/family education;Therapeutic exercise;Neuromuscular re-education;Manual techniques;Taping;Dry needling;Electrical Stimulation;Moist Heat;Cryotherapy;Iontophoresis 4mg /ml Dexamethasone;ADLs/Self Care Home Management   PT Next Visit Plan possible trial of dry needling to L lower leg; manual therapy to address increased muscle tension + taping as indicated & benefit noted; LE strengthening   Consulted and Agree with Plan of Care Patient      Patient will benefit  from skilled therapeutic intervention in order to improve the following deficits and impairments:  Pain, Impaired flexibility, Decreased range of motion, Decreased strength, Increased muscle spasms, Increased fascial restricitons, Difficulty walking  Visit Diagnosis: Pain in left lower leg  Other symptoms and signs involving the musculoskeletal system  Muscle weakness (generalized)     Problem  List Patient Active Problem List   Diagnosis Date Noted  . Diarrhea 12/02/2015  . History of colonic polyps 12/02/2015  . Abdominal pain, epigastric 12/02/2015  . PCP NOTES >>>>>>>>>>>>>>>>>>>>>>>>> 02/13/2015  . Anxiety 08/04/2014  . Annual physical exam 04/29/2014  . Leg pain, bilateral 02/19/2014  . Fibromyalgia syndrome 02/04/2014  . Dysphagia esophageal phase - chronic after 3 fundoplications 85/88/5027  . Cough 01/08/2014  . Chest pressure 01/08/2014  . DOE (dyspnea on exertion) 12/26/2013  . RUQ pain 11/16/2013  . Diabetes ([place]) 09/24/2013  . Abdominal pain, chronic, right flank -upper quadrant 07/02/2012  . Overweight(278.02) 09/30/2009  . HYPERSOMNIA, ASSOCIATED WITH SLEEP APNEA 09/28/2009  . Essential hypertension 06/24/2009  . DIZZINESS 06/24/2009  . HEADACHE 06/24/2009  . VENOUS INSUFFICIENCY, LEGS 11/19/2008  . KNEE PAIN 06/01/2008  . Acute pulmonary embolism ([place]) 05/19/2008  . Pulmonary nodule 08/26/2007  . Stricture and stenosis of esophagus 05/22/2007  . GERD 01/31/2007  . IBS (irritable bowel syndrome) 01/31/2007  . MITRAL VALVE PROLAPSE, HX OF 01/31/2007  . APPENDECTOMY, HX OF 01/31/2007    Lanney Gins, PT, DPT 10/04/16 2:53 PM   [facility] [address]  [place], [place], [address] Phone: [phone]   Fax:  [phone]  Name: DORIAN RENFRO MRN: [id number] Date of Birth: [birth year]

## 2016-10-09 ENCOUNTER — Ambulatory Visit: Payer: Federal, State, Local not specified - PPO | Admitting: Physical Therapy

## 2016-10-09 DIAGNOSIS — M79662 Pain in left lower leg: Secondary | ICD-10-CM | POA: Diagnosis not present

## 2016-10-09 DIAGNOSIS — M6281 Muscle weakness (generalized): Secondary | ICD-10-CM

## 2016-10-09 DIAGNOSIS — R29898 Other symptoms and signs involving the musculoskeletal system: Secondary | ICD-10-CM

## 2016-10-09 NOTE — Therapy (Signed)
[facility] [address]  [place], [place], [address] Phone: [phone]   Fax:  [phone]  Physical Therapy Treatment  Patient Details  Name: Jennifer [name] MRN: [id number] Date of Birth: [birth year] Referring Provider: Ames Coupe, DO  Encounter Date: 10/09/2016      PT End of Session - 10/09/16 1314    Visit Number 7   Number of Visits 12   Date for PT Re-Evaluation 10/27/16   Authorization Type Federal [employer] & [employer]   PT Start Time 1314   PT Stop Time 1401   PT Time Calculation (min) 47 min   Activity Tolerance Patient tolerated treatment well   Behavior During Therapy [facility] for tasks assessed/performed      Past Medical History:  Diagnosis Date  . Anemia   . Anxiety   . Chronic chest pain   . Colon polyps    inflamed partially ulcerated hyperplastic polyp  . Diabetes ([place]) 09/24/2013  . Diverticulosis   . Dysphagia esophageal phase - chronic after 3 fundoplications 10/22/1599  . Esophageal stenosis    Stricture after fundoplication REDO  . GERD (gastroesophageal reflux disease)   . Hemorrhoids   . Hypertension   . IBS (irritable bowel syndrome)   . Mitral valve prolapse   . OSA (obstructive sleep apnea)    on CPAP-Mild - Sleep study 2004  . Pulmonary embolus ([place])    2010 and 12-2013  . Shortness of breath    with exertion  . Sleep apnea    wears CPAP  . Tinnitus    Subjective  . Zoster 2014   R flank    Past Surgical History:  Procedure Laterality Date  . ABDOMINAL HYSTERECTOMY     still has 1 ovary   . APPENDECTOMY    . [place]   Stem surgery, Arnold Chiari Malformation  . BREAST REDUCTION SURGERY    . CATARACT EXTRACTION Bilateral 2017   cataracts, Dr. Herbert Deaner  . CHOLECYSTECTOMY  2008  . COLONOSCOPY    . ESOPHAGOGASTRODUODENOSCOPY    . HERNIA REPAIR     Hital Hernia  . KNEE ARTHROSCOPY  05/16/2012   Procedure: ARTHROSCOPY KNEE;  Surgeon: Sharmon Revere, MD;   Location: [facility];  Service: Orthopedics;  Laterality: Left;  . NISSEN FUNDOPLICATION     X 3 lab, open x 2 last with mesh  . REDUCTION MAMMAPLASTY Bilateral 2007    There were no vitals filed for this visit.      Subjective Assessment - 10/09/16 1317    Subjective Feels like things are getting better. Has been able to walk more (1.5 miles on treadmill) and has been keeping up with her stretches. Still wants to wait until she returns from vacation to consider DN.   Pertinent History L knee arthroscopy 04/2012 (meniscal injury per pt report); h/o mutiple DVT's in L LE with PE   Diagnostic tests 08/24/16 LE venous doppler: No evidence of DVT within the left lower extremity.   Patient Stated Goals "to find out what's going on with my legs and fix it; get my legs strong"   Currently in Pain? Yes   Pain Score 3    Pain Location Leg   Pain Orientation Left;Anterior;Lower   Pain Descriptors / Indicators Throbbing;Aching   Pain Type Chronic pain                         [facility]  Adult PT Treatment/Exercise - 10/09/16 1314      Knee/Hip Exercises: Stretches   Gastroc Stretch Left;30 seconds;3 reps   Company secretary     Knee/Hip Exercises: Aerobic   Nustep L6 x 6 minutes     Knee/Hip Exercises: Machines for Strengthening   Cybex Knee Extension 15# B con/ecc x10   Cybex Knee Flexion 25# B con/ecc x10, 20# B con/L ecc x10     Knee/Hip Exercises: Standing   Heel Raises Both;10 reps;3 seconds;2 sets   Heel Raises Limitations negative heel at back pf UBE; 2nd set with L eccentric lowering     Manual Therapy   Manual Therapy Taping   Kinesiotex Inhibit Muscle     Kinesiotix   Inhibit Muscle  L anterior tibialis - 30% from dorsum of foot to tibial tuberosity, 2 - 50% perpendicular strips at areas of greatest pain/tenderness; L gastroc - 30% from inferior heel to upper midline calf, 30% x 2 from medial/lateral inferior heel crossing over achilles to opposite  gastroc muscle belly     Ankle Exercises: Seated   Other Seated Ankle Exercises 4 way resisted ankle - green tband x 15 each direction                PT Education - 10/09/16 1444    Education provided Yes   Education Details 4 way ankle TB exercises with green TB added to HEP   Person(s) Educated Patient   Methods Explanation;Demonstration;Handout   Comprehension Verbalized understanding;Returned demonstration          PT Short Term Goals - 09/25/16 1328      PT SHORT TERM GOAL #1   Title Independent with initial HEP by 09/29/16   Status Achieved           PT Long Term Goals - 09/21/16 1318      PT LONG TERM GOAL #1   Title Independent with advanced HEP +/- gym program by 10/27/16   Status On-going     PT LONG TERM GOAL #2   Title B LE flexibility WFL to allow functional AROM at knee and ankle by 10/27/16   Status On-going     PT LONG TERM GOAL #3   Title B LE strength >/= 4/5 for improved LE stability by 10/27/16   Status On-going     PT LONG TERM GOAL #4   Title Pt will report ability to complete daily mobility, ambulation and household chores w/o limitation due to L LE pain by 10/27/16   Status On-going               Plan - 10/09/16 1320    Clinical Impression Statement Pt noting improving activity tolerance with walking with gradually decreasing tightness and soreness. Continued increased muscle tension with taut bands/TPs present in distal LE and pt would likely benefit from DN, but wanting to wait until she returns from vacation. Reviewed 4 way ankle TB exercises and provided HEP handout for performance while on vacation. Treatment session completed with reapplication of kinesiotaping at pt's request.   Rehab Potential Good   Clinical Impairments Affecting Rehab Potential h/o multiple DVT's & PE's on Xarelto   PT Treatment/Interventions Patient/family education;Therapeutic exercise;Neuromuscular re-education;Manual techniques;Taping;Dry  needling;Electrical Stimulation;Moist Heat;Cryotherapy;Iontophoresis 4mg /ml Dexamethasone;ADLs/Self Care Home Management   PT Next Visit Plan possible trial of dry needling to L lower leg; manual therapy to address increased muscle tension + taping as indicated & benefit noted; LE strengthening   Consulted and Agree with Plan  of Care Patient      Patient will benefit from skilled therapeutic intervention in order to improve the following deficits and impairments:  Pain, Impaired flexibility, Decreased range of motion, Decreased strength, Increased muscle spasms, Increased fascial restricitons, Difficulty walking  Visit Diagnosis: Pain in left lower leg  Other symptoms and signs involving the musculoskeletal system  Muscle weakness (generalized)     Problem List Patient Active Problem List   Diagnosis Date Noted  . Diarrhea 12/02/2015  . History of colonic polyps 12/02/2015  . Abdominal pain, epigastric 12/02/2015  . PCP NOTES >>>>>>>>>>>>>>>>>>>>>>>>> 02/13/2015  . Anxiety 08/04/2014  . Annual physical exam 04/29/2014  . Leg pain, bilateral 02/19/2014  . Fibromyalgia syndrome 02/04/2014  . Dysphagia esophageal phase - chronic after 3 fundoplications 18/33/5825  . Cough 01/08/2014  . Chest pressure 01/08/2014  . DOE (dyspnea on exertion) 12/26/2013  . RUQ pain 11/16/2013  . Diabetes ([place]) 09/24/2013  . Abdominal pain, chronic, right flank -upper quadrant 07/02/2012  . Overweight(278.02) 09/30/2009  . HYPERSOMNIA, ASSOCIATED WITH SLEEP APNEA 09/28/2009  . Essential hypertension 06/24/2009  . DIZZINESS 06/24/2009  . HEADACHE 06/24/2009  . VENOUS INSUFFICIENCY, LEGS 11/19/2008  . KNEE PAIN 06/01/2008  . Acute pulmonary embolism ([place]) 05/19/2008  . Pulmonary nodule 08/26/2007  . Stricture and stenosis of esophagus 05/22/2007  . GERD 01/31/2007  . IBS (irritable bowel syndrome) 01/31/2007  . MITRAL VALVE PROLAPSE, HX OF 01/31/2007  . APPENDECTOMY, HX OF 01/31/2007     Percival Spanish, PT, MPT 10/09/2016, 2:47 PM  [facility] [address]  Agency [place], [place], [address] Phone: [phone]   Fax:  [phone]  Name: Jennifer [name] MRN: [id number] Date of Birth: 01-25-1951

## 2016-10-11 ENCOUNTER — Ambulatory Visit: Payer: Federal, State, Local not specified - PPO | Admitting: Physical Therapy

## 2016-10-11 DIAGNOSIS — R29898 Other symptoms and signs involving the musculoskeletal system: Secondary | ICD-10-CM

## 2016-10-11 DIAGNOSIS — M79662 Pain in left lower leg: Secondary | ICD-10-CM | POA: Diagnosis not present

## 2016-10-11 DIAGNOSIS — M6281 Muscle weakness (generalized): Secondary | ICD-10-CM

## 2016-10-11 NOTE — Therapy (Signed)
[facility] [address]  [place], [place], [address] Phone: [phone]   Fax:  [phone]  Physical Therapy Treatment  Patient Details  Name: Jennifer [name] MRN: [id number] Date of Birth: December 05, 1950 Referring Provider: Ames Coupe, DO  Encounter Date: 10/11/2016      PT End of Session - 10/11/16 1617    Visit Number 8   Number of Visits 12   Date for PT Re-Evaluation 10/27/16   Authorization Type Federal [employer] & [employer]   PT Start Time 2706   PT Stop Time 1455   PT Time Calculation (min) 57 min   Activity Tolerance Patient tolerated treatment well   Behavior During Therapy [facility] for tasks assessed/performed      Past Medical History:  Diagnosis Date  . Anemia   . Anxiety   . Chronic chest pain   . Colon polyps    inflamed partially ulcerated hyperplastic polyp  . Diabetes ([place]) 09/24/2013  . Diverticulosis   . Dysphagia esophageal phase - chronic after 3 fundoplications 2/37/6283  . Esophageal stenosis    Stricture after fundoplication REDO  . GERD (gastroesophageal reflux disease)   . Hemorrhoids   . Hypertension   . IBS (irritable bowel syndrome)   . Mitral valve prolapse   . OSA (obstructive sleep apnea)    on CPAP-Mild - Sleep study 2004  . Pulmonary embolus ([place])    2010 and 12-2013  . Shortness of breath    with exertion  . Sleep apnea    wears CPAP  . Tinnitus    Subjective  . Zoster 2014   R flank    Past Surgical History:  Procedure Laterality Date  . ABDOMINAL HYSTERECTOMY     still has 1 ovary   . APPENDECTOMY    . [place]   Stem surgery, Arnold Chiari Malformation  . BREAST REDUCTION SURGERY    . CATARACT EXTRACTION Bilateral 2017   cataracts, Dr. Herbert Deaner  . CHOLECYSTECTOMY  2008  . COLONOSCOPY    . ESOPHAGOGASTRODUODENOSCOPY    . HERNIA REPAIR     Hital Hernia  . KNEE ARTHROSCOPY  05/16/2012   Procedure: ARTHROSCOPY KNEE;  Surgeon: Sharmon Revere, MD;   Location: [facility];  Service: Orthopedics;  Laterality: Left;  . NISSEN FUNDOPLICATION     X 3 lab, open x 2 last with mesh  . REDUCTION MAMMAPLASTY Bilateral 2007    There were no vitals filed for this visit.      Subjective Assessment - 10/11/16 1359    Subjective tpae bothered her foot, so she took it off.    Pertinent History L knee arthroscopy 04/2012 (meniscal injury per pt report); h/o mutiple DVT's in L LE with PE   Diagnostic tests 08/24/16 LE venous doppler: No evidence of DVT within the left lower extremity.   Patient Stated Goals "to find out what's going on with my legs and fix it; get my legs strong"   Currently in Pain? Yes   Pain Score 4    Pain Location Leg   Pain Orientation Left;Anterior;Lower   Pain Descriptors / Indicators Aching;Dull   Pain Type Chronic pain                         [facility] Adult PT Treatment/Exercise - 10/11/16 1405      Knee/Hip Exercises: Stretches   Passive Hamstring Stretch Left;3 reps;30 seconds  Knee/Hip Exercises: Aerobic   Nustep L6 x 6 minutes     Knee/Hip Exercises: Machines for Strengthening   Cybex Knee Extension 20# B con/L ecc x 15    Cybex Knee Flexion 20# B con/ L ecc x 15     Knee/Hip Exercises: Standing   Step Down Left;15 reps;Hand Hold: 2;Step Height: 6"   SLS SLS on foam - 3 pebble taps x 15 reps     Knee/Hip Exercises: Seated   Other Seated Knee/Hip Exercises Fitter - 2 blue x 15 - L LE     Knee/Hip Exercises: Supine   Straight Leg Raises Strengthening;Left;15 reps   Straight Leg Raises Limitations 3#     Vasopneumatic   Number Minutes Vasopneumatic  15 minutes   Vasopnuematic Location  --  lower leg   Vasopneumatic Pressure Medium   Vasopneumatic Temperature  coldest temp                  PT Short Term Goals - 09/25/16 1328      PT SHORT TERM GOAL #1   Title Independent with initial HEP by 09/29/16   Status Achieved           PT Long Term Goals - 09/21/16 1318      PT  LONG TERM GOAL #1   Title Independent with advanced HEP +/- gym program by 10/27/16   Status On-going     PT LONG TERM GOAL #2   Title B LE flexibility WFL to allow functional AROM at knee and ankle by 10/27/16   Status On-going     PT LONG TERM GOAL #3   Title B LE strength >/= 4/5 for improved LE stability by 10/27/16   Status On-going     PT LONG TERM GOAL #4   Title Pt will report ability to complete daily mobility, ambulation and household chores w/o limitation due to L LE pain by 10/27/16   Status On-going               Plan - 10/11/16 1619    Clinical Impression Statement Patient doing well today - feels like she is starting to notice an improvement in symptoms. Patient doing well with all strengthening and balance work today with fatigue limiting some activities requiring rest breaks. Taping not reapplied today due to reports of bug bite on superior foot. Ended session with vaso to lower leg as patient finds good benefit from this.    PT Treatment/Interventions Patient/family education;Therapeutic exercise;Neuromuscular re-education;Manual techniques;Taping;Dry needling;Electrical Stimulation;Moist Heat;Cryotherapy;Iontophoresis 4mg /ml Dexamethasone;ADLs/Self Care Home Management   PT Next Visit Plan possible trial of dry needling to L lower leg; manual therapy to address increased muscle tension + taping as indicated & benefit noted; LE strengthening   Consulted and Agree with Plan of Care Patient      Patient will benefit from skilled therapeutic intervention in order to improve the following deficits and impairments:  Pain, Impaired flexibility, Decreased range of motion, Decreased strength, Increased muscle spasms, Increased fascial restricitons, Difficulty walking  Visit Diagnosis: Pain in left lower leg  Other symptoms and signs involving the musculoskeletal system  Muscle weakness (generalized)     Problem List Patient Active Problem List   Diagnosis Date Noted   . Diarrhea 12/02/2015  . History of colonic polyps 12/02/2015  . Abdominal pain, epigastric 12/02/2015  . PCP NOTES >>>>>>>>>>>>>>>>>>>>>>>>> 02/13/2015  . Anxiety 08/04/2014  . Annual physical exam 04/29/2014  . Leg pain, bilateral 02/19/2014  . Fibromyalgia syndrome 02/04/2014  .  Dysphagia esophageal phase - chronic after 3 fundoplications 97/35/3299  . Cough 01/08/2014  . Chest pressure 01/08/2014  . DOE (dyspnea on exertion) 12/26/2013  . RUQ pain 11/16/2013  . Diabetes ([place]) 09/24/2013  . Abdominal pain, chronic, right flank -upper quadrant 07/02/2012  . Overweight(278.02) 09/30/2009  . HYPERSOMNIA, ASSOCIATED WITH SLEEP APNEA 09/28/2009  . Essential hypertension 06/24/2009  . DIZZINESS 06/24/2009  . HEADACHE 06/24/2009  . VENOUS INSUFFICIENCY, LEGS 11/19/2008  . KNEE PAIN 06/01/2008  . Acute pulmonary embolism ([place]) 05/19/2008  . Pulmonary nodule 08/26/2007  . Stricture and stenosis of esophagus 05/22/2007  . GERD 01/31/2007  . IBS (irritable bowel syndrome) 01/31/2007  . MITRAL VALVE PROLAPSE, HX OF 01/31/2007  . APPENDECTOMY, HX OF 01/31/2007     Lanney Gins, PT, DPT 10/11/16 4:22 PM   [facility] [address]  [place], [place], [address] Phone: [phone]   Fax:  [phone]  Name: Jennifer [name] MRN: [id number] Date of Birth: [birth year]

## 2016-10-23 ENCOUNTER — Ambulatory Visit: Payer: Federal, State, Local not specified - PPO | Attending: Family Medicine | Admitting: Physical Therapy

## 2016-10-23 DIAGNOSIS — M79662 Pain in left lower leg: Secondary | ICD-10-CM | POA: Insufficient documentation

## 2016-10-23 DIAGNOSIS — M6281 Muscle weakness (generalized): Secondary | ICD-10-CM | POA: Diagnosis present

## 2016-10-23 DIAGNOSIS — R29898 Other symptoms and signs involving the musculoskeletal system: Secondary | ICD-10-CM | POA: Diagnosis present

## 2016-10-23 NOTE — Therapy (Signed)
[facility] [address]  [place], [place], [address] Phone: [phone]   Fax:  [phone]  Physical Therapy Treatment  Patient Details  Name: Jennifer [name] MRN: [id number] Date of Birth: Jun 05, 1950 Referring Provider: Ames Coupe, DO  Encounter Date: 10/23/2016      PT End of Session - 10/23/16 1622    Visit Number 9   Number of Visits 12   Date for PT Re-Evaluation 10/27/16   Authorization Type Federal [employer] & [employer]   PT Start Time 1616   PT Stop Time 1714   PT Time Calculation (min) 58 min   Activity Tolerance Patient tolerated treatment well   Behavior During Therapy [facility] for tasks assessed/performed      Past Medical History:  Diagnosis Date  . Anemia   . Anxiety   . Chronic chest pain   . Colon polyps    inflamed partially ulcerated hyperplastic polyp  . Diabetes ([place]) 09/24/2013  . Diverticulosis   . Dysphagia esophageal phase - chronic after 3 fundoplications 2/29/7989  . Esophageal stenosis    Stricture after fundoplication REDO  . GERD (gastroesophageal reflux disease)   . Hemorrhoids   . Hypertension   . IBS (irritable bowel syndrome)   . Mitral valve prolapse   . OSA (obstructive sleep apnea)    on CPAP-Mild - Sleep study 2004  . Pulmonary embolus ([place])    2010 and 12-2013  . Shortness of breath    with exertion  . Sleep apnea    wears CPAP  . Tinnitus    Subjective  . Zoster 2014   R flank    Past Surgical History:  Procedure Laterality Date  . ABDOMINAL HYSTERECTOMY     still has 1 ovary   . APPENDECTOMY    . [place]   Stem surgery, Arnold Chiari Malformation  . BREAST REDUCTION SURGERY    . CATARACT EXTRACTION Bilateral 2017   cataracts, Dr. Herbert Deaner  . CHOLECYSTECTOMY  2008  . COLONOSCOPY    . ESOPHAGOGASTRODUODENOSCOPY    . HERNIA REPAIR     Hital Hernia  . KNEE ARTHROSCOPY  05/16/2012   Procedure: ARTHROSCOPY KNEE;  Surgeon: Sharmon Revere, MD;   Location: [facility];  Service: Orthopedics;  Laterality: Left;  . NISSEN FUNDOPLICATION     X 3 lab, open x 2 last with mesh  . REDUCTION MAMMAPLASTY Bilateral 2007    There were no vitals filed for this visit.      Subjective Assessment - 10/23/16 1619    Subjective walked a lot on her cruise.    Pertinent History L knee arthroscopy 04/2012 (meniscal injury per pt report); h/o mutiple DVT's in L LE with PE   Diagnostic tests 08/24/16 LE venous doppler: No evidence of DVT within the left lower extremity.   Patient Stated Goals "to find out what's going on with my legs and fix it; get my legs strong"   Currently in Pain? Yes   Pain Score 4    Pain Location Leg   Pain Orientation Left;Anterior;Lower   Pain Descriptors / Indicators Aching;Sore;Dull   Pain Type Chronic pain                         [facility] Adult PT Treatment/Exercise - 10/23/16 0001      Knee/Hip Exercises: Stretches   Other Knee/Hip Stretches L ITB stretch 3 x 30 sec  Knee/Hip Exercises: Aerobic   Nustep L6 x 6 minutes     Knee/Hip Exercises: Supine   Bridges Both;15 reps   Straight Leg Raises Strengthening;Left;15 reps   Straight Leg Raises Limitations 3#   Straight Leg Raise with External Rotation Strengthening;Left;15 reps   Straight Leg Raise with External Rotation Limitations 3#     Modalities   Modalities Cryotherapy     Cryotherapy   Number Minutes Cryotherapy 15 Minutes   Cryotherapy Location --  L lower leg   Type of Cryotherapy Ice pack     Manual Therapy   Manual Therapy Soft tissue mobilization   Manual therapy comments pt supine   Soft tissue mobilization STM to L anterior tib, ITB insertion and L VL at ITB     Ankle Exercises: Supine   Other Supine Ankle Exercises 4 way resisted ankle - green tband x 15 reps                  PT Short Term Goals - 09/25/16 1328      PT SHORT TERM GOAL #1   Title Independent with initial HEP by 09/29/16   Status Achieved            PT Long Term Goals - 09/21/16 1318      PT LONG TERM GOAL #1   Title Independent with advanced HEP +/- gym program by 10/27/16   Status On-going     PT LONG TERM GOAL #2   Title B LE flexibility WFL to allow functional AROM at knee and ankle by 10/27/16   Status On-going     PT LONG TERM GOAL #3   Title B LE strength >/= 4/5 for improved LE stability by 10/27/16   Status On-going     PT LONG TERM GOAL #4   Title Pt will report ability to complete daily mobility, ambulation and household chores w/o limitation due to L LE pain by 10/27/16   Status On-going               Plan - 10/23/16 1745    Clinical Impression Statement Patient today reporting continued mid-level pain - did well while on vacation, even with heavy walking. Patient tender at both anterior tibialis region as well as medial knee at ITB insertion. IT band stretching today with good response. Patient to continue to progress as tolerated.    PT Treatment/Interventions Patient/family education;Therapeutic exercise;Neuromuscular re-education;Manual techniques;Taping;Dry needling;Electrical Stimulation;Moist Heat;Cryotherapy;Iontophoresis 4mg /ml Dexamethasone;ADLs/Self Care Home Management   PT Next Visit Plan possible trial of dry needling to L lower leg; manual therapy to address increased muscle tension + taping as indicated & benefit noted; LE strengthening   Consulted and Agree with Plan of Care Patient      Patient will benefit from skilled therapeutic intervention in order to improve the following deficits and impairments:  Pain, Impaired flexibility, Decreased range of motion, Decreased strength, Increased muscle spasms, Increased fascial restricitons, Difficulty walking  Visit Diagnosis: Pain in left lower leg  Other symptoms and signs involving the musculoskeletal system  Muscle weakness (generalized)     Problem List Patient Active Problem List   Diagnosis Date Noted  . Diarrhea 12/02/2015   . History of colonic polyps 12/02/2015  . Abdominal pain, epigastric 12/02/2015  . PCP NOTES >>>>>>>>>>>>>>>>>>>>>>>>> 02/13/2015  . Anxiety 08/04/2014  . Annual physical exam 04/29/2014  . Leg pain, bilateral 02/19/2014  . Fibromyalgia syndrome 02/04/2014  . Dysphagia esophageal phase - chronic after 3 fundoplications 94/76/5465  . Cough  01/08/2014  . Chest pressure 01/08/2014  . DOE (dyspnea on exertion) 12/26/2013  . RUQ pain 11/16/2013  . Diabetes ([place]) 09/24/2013  . Abdominal pain, chronic, right flank -upper quadrant 07/02/2012  . Overweight(278.02) 09/30/2009  . HYPERSOMNIA, ASSOCIATED WITH SLEEP APNEA 09/28/2009  . Essential hypertension 06/24/2009  . DIZZINESS 06/24/2009  . HEADACHE 06/24/2009  . VENOUS INSUFFICIENCY, LEGS 11/19/2008  . KNEE PAIN 06/01/2008  . Acute pulmonary embolism ([place]) 05/19/2008  . Pulmonary nodule 08/26/2007  . Stricture and stenosis of esophagus 05/22/2007  . GERD 01/31/2007  . IBS (irritable bowel syndrome) 01/31/2007  . MITRAL VALVE PROLAPSE, HX OF 01/31/2007  . APPENDECTOMY, HX OF 01/31/2007     Lanney Gins, PT, DPT 10/23/16 5:51 PM   [facility] [address]  [place], [place], [address] Phone: [phone]   Fax:  [phone]  Name: Jennifer [name] MRN: [id number] Date of Birth: [birth year]

## 2016-10-30 ENCOUNTER — Ambulatory Visit: Payer: Federal, State, Local not specified - PPO | Admitting: Physical Therapy

## 2016-10-30 DIAGNOSIS — M79662 Pain in left lower leg: Secondary | ICD-10-CM | POA: Diagnosis not present

## 2016-10-30 DIAGNOSIS — R29898 Other symptoms and signs involving the musculoskeletal system: Secondary | ICD-10-CM

## 2016-10-30 DIAGNOSIS — M6281 Muscle weakness (generalized): Secondary | ICD-10-CM

## 2016-10-30 NOTE — Therapy (Signed)
[facility] [address]  [place], [place], [address] Phone: [phone]   Fax:  [phone]  Physical Therapy Treatment  Patient Details  Name: Jennifer [name] MRN: [id number] Date of Birth: 20-Aug-1950 Referring Provider: Ames Coupe, DO  Encounter Date: 10/30/2016      PT End of Session - 10/30/16 1410    Visit Number 10   Number of Visits 18   Date for PT Re-Evaluation 12/01/16   Authorization Type Federal [employer] & [employer]   PT Start Time 1410   PT Stop Time 1449   PT Time Calculation (min) 39 min   Activity Tolerance Patient tolerated treatment well   Behavior During Therapy [facility] for tasks assessed/performed      Past Medical History:  Diagnosis Date  . Anemia   . Anxiety   . Chronic chest pain   . Colon polyps    inflamed partially ulcerated hyperplastic polyp  . Diabetes ([place]) 09/24/2013  . Diverticulosis   . Dysphagia esophageal phase - chronic after 3 fundoplications 05/02/3233  . Esophageal stenosis    Stricture after fundoplication REDO  . GERD (gastroesophageal reflux disease)   . Hemorrhoids   . Hypertension   . IBS (irritable bowel syndrome)   . Mitral valve prolapse   . OSA (obstructive sleep apnea)    on CPAP-Mild - Sleep study 2004  . Pulmonary embolus ([place])    2010 and 12-2013  . Shortness of breath    with exertion  . Sleep apnea    wears CPAP  . Tinnitus    Subjective  . Zoster 2014   R flank    Past Surgical History:  Procedure Laterality Date  . ABDOMINAL HYSTERECTOMY     still has 1 ovary   . APPENDECTOMY    . [place]   Stem surgery, Arnold Chiari Malformation  . BREAST REDUCTION SURGERY    . CATARACT EXTRACTION Bilateral 2017   cataracts, Dr. Herbert Deaner  . CHOLECYSTECTOMY  2008  . COLONOSCOPY    . ESOPHAGOGASTRODUODENOSCOPY    . HERNIA REPAIR     Hital Hernia  . KNEE ARTHROSCOPY  05/16/2012   Procedure: ARTHROSCOPY KNEE;  Surgeon: Sharmon Revere, MD;   Location: [place];  Service: Orthopedics;  Laterality: Left;  . NISSEN FUNDOPLICATION     X 3 lab, open x 2 last with mesh  . REDUCTION MAMMAPLASTY Bilateral 2007    There were no vitals filed for this visit.      Subjective Assessment - 10/30/16 1413    Subjective Pt reports feeling better today. Has been able to walk on the TM for 30-45 minutes.   Pertinent History L knee arthroscopy 04/2012 (meniscal injury per pt report); h/o mutiple DVT's in L LE with PE   How long can you walk comfortably? 30-45 minutes   Diagnostic tests 08/24/16 LE venous doppler: No evidence of DVT within the left lower extremity.   Patient Stated Goals "to find out what's going on with my legs and fix it; get my legs strong"   Currently in Pain? No/denies   Pain Score 0-No pain            [facility] PT Assessment - 10/30/16 1410      Assessment   Medical Diagnosis Pain in L LE - distal lateral thigh & anteriolateral lower leg   Referring Provider Ames Coupe, DO   Onset Date/Surgical Date --  ~3 months  Next MD Visit 11/22/16 - Dr Larose Kells     Observation/Other Assessments   Focus on Therapeutic Outcomes (FOTO)  Lower leg - 59% (41% limitation)     Strength   Right Hip Flexion 4/5   Right Hip Extension 4-/5   Right Hip External Rotation  4/5   Right Hip Internal Rotation 4/5   Right Hip ABduction 4/5   Right Hip ADduction 4-/5   Left Hip Flexion 4/5   Left Hip Extension 4-/5   Left Hip External Rotation 4-/5   Left Hip Internal Rotation 4-/5   Left Hip ABduction 4/5   Left Hip ADduction 4-/5   Right Knee Flexion 4+/5   Right Knee Extension 4+/5   Left Knee Flexion 4/5   Left Knee Extension 4/5   Right Ankle Dorsiflexion 4+/5  pain   Right Ankle Plantar Flexion 4-/5   Right Ankle Inversion 4+/5  pain   Right Ankle Eversion 4+/5   Left Ankle Dorsiflexion 4+/5  pain   Left Ankle Plantar Flexion 4-/5   Left Ankle Inversion 4+/5  pain   Left Ankle Eversion 4+/5     Flexibility    Hamstrings WFL   Quadriceps mod tight B   ITB WFL   Piriformis mild tight B                     [facility] Adult PT Treatment/Exercise - 10/30/16 1410      Knee/Hip Exercises: Aerobic   Nustep L6 x 6 minutes                PT Education - 10/30/16 1440    Education provided Yes   Education Details Education on role of DN in addressing increased muscle tension & TPs for pain relief   Person(s) Educated Patient   Methods Explanation   Comprehension Verbalized understanding;Need further instruction          PT Short Term Goals - 09/25/16 1328      PT SHORT TERM GOAL #1   Title Independent with initial HEP by 09/29/16   Status Achieved           PT Long Term Goals - 10/30/16 1411      PT LONG TERM GOAL #1   Title Independent with advanced HEP +/- gym program by 12/01/16   Status Partially Met     PT LONG TERM GOAL #2   Title B LE flexibility WFL to allow functional AROM at knee and ankle by 12/01/16   Status Partially Met     PT LONG TERM GOAL #3   Title B LE strength >/= 4/5 for improved LE stability by 12/01/16   Status Partially Met     PT LONG TERM GOAL #4   Title Pt will report ability to complete daily mobility, ambulation and household chores w/o limitation due to L LE pain by 12/01/16   Status Partially Met               Plan - 10/30/16 1416    Clinical Impression Statement Pt noting benefit from PT but still reporting fairly consistent pain in distal LEs with signficant increased muscle tension still present. Flexibility and strength have imporved but LTGs only partially met at present. Readdressed possiblility of trial of dry needling to address persistent increased muscle tension and pain, with pt willing to try at next visit, therefore will recommend recert to extend POC x 4 weeks pending postive response to DN and continued progress toward goals.  Rehab Potential Good   Clinical Impairments Affecting Rehab Potential h/o multiple  DVT's & PE's on Xarelto, fibromyalgia, h/o Arnold-Chiari malformation   PT Frequency 2x / week   PT Duration 4 weeks   PT Treatment/Interventions Patient/family education;Therapeutic exercise;Neuromuscular re-education;Manual techniques;Taping;Dry needling;Electrical Stimulation;Moist Heat;Cryotherapy;Iontophoresis 46m/ml Dexamethasone;ADLs/Self Care Home Management;Balance training;Therapeutic activities;Functional mobility training;Gait training;Stair training   PT Next Visit Plan trial of dry needling to L lower leg; manual therapy to address increased muscle tension + taping as indicated & benefit noted; LE strengthening   Consulted and Agree with Plan of Care Patient      Patient will benefit from skilled therapeutic intervention in order to improve the following deficits and impairments:  Pain, Impaired flexibility, Decreased range of motion, Decreased strength, Increased muscle spasms, Increased fascial restricitons, Difficulty walking  Visit Diagnosis: Pain in left lower leg  Other symptoms and signs involving the musculoskeletal system  Muscle weakness (generalized)       G-Codes - 007/20/20181446    Functional Assessment Tool Used (Outpatient Only) Lower leg FOTO = 59% (41% limitation)   Functional Limitation Mobility: Walking and moving around   Mobility: Walking and Moving Around Current Status ((G6659 At least 40 percent but less than 60 percent impaired, limited or restricted   Mobility: Walking and Moving Around Goal Status ((D3570 At least 20 percent but less than 40 percent impaired, limited or restricted      Problem List Patient Active Problem List   Diagnosis Date Noted  . Diarrhea 12/02/2015  . History of colonic polyps 12/02/2015  . Abdominal pain, epigastric 12/02/2015  . PCP NOTES >>>>>>>>>>>>>>>>>>>>>>>>> 02/13/2015  . Anxiety 08/04/2014  . Annual physical exam 04/29/2014  . Leg pain, bilateral 02/19/2014  . Fibromyalgia syndrome 02/04/2014  . Dysphagia  esophageal phase - chronic after 3 fundoplications 017/79/3903 . Cough 01/08/2014  . Chest pressure 01/08/2014  . DOE (dyspnea on exertion) 12/26/2013  . RUQ pain 11/16/2013  . Diabetes ([place] 09/24/2013  . Abdominal pain, chronic, right flank -upper quadrant 07/02/2012  . Overweight(278.02) 09/30/2009  . HYPERSOMNIA, ASSOCIATED WITH SLEEP APNEA 09/28/2009  . Essential hypertension 06/24/2009  . DIZZINESS 06/24/2009  . HEADACHE 06/24/2009  . VENOUS INSUFFICIENCY, LEGS 11/19/2008  . KNEE PAIN 06/01/2008  . [place] pulmonary [place]ism ([place] 05/19/2008  . Pulmonary nodule 08/26/2007  . Stricture and stenosis of esophagus 05/22/2007  . GERD 01/31/2007  . IBS (irritable bowel syndrome) 01/31/2007  . MITRAL VALVE PROLAPSE, HX OF 01/31/2007  . APPENDECTOMY, HX OF 01/31/2007    JPercival Spanish PT, MPT [id number] 6:22 PM  [facility] [place] [address]Phone: [phone]  Fax:  [phone] Name: Jennifer GRAVERMRN: [id number] of Birth: [birth year]

## 2016-11-01 ENCOUNTER — Ambulatory Visit: Payer: Federal, State, Local not specified - PPO | Admitting: Physical Therapy

## 2016-11-01 DIAGNOSIS — M79662 Pain in left lower leg: Secondary | ICD-10-CM

## 2016-11-01 DIAGNOSIS — R29898 Other symptoms and signs involving the musculoskeletal system: Secondary | ICD-10-CM

## 2016-11-01 DIAGNOSIS — M6281 Muscle weakness (generalized): Secondary | ICD-10-CM

## 2016-11-01 NOTE — Patient Instructions (Signed)

## 2016-11-01 NOTE — Therapy (Signed)
[facility] [address]  [place], [place], [address] Phone: [phone]   Fax:  [phone]  Physical Therapy Treatment  Patient Details  Name: Jennifer [name] MRN: [id number] Date of Birth: 1950-06-11 Referring Provider: Ames Coupe, DO  Encounter Date: 11/01/2016      PT End of Session - 11/01/16 1410    Visit Number 11   Number of Visits 18   Date for PT Re-Evaluation 12/01/16   Authorization Type Federal [employer] & [employer]   PT Start Time 1408   PT Stop Time 1504  ice pack at end of session   PT Time Calculation (min) 56 min   Activity Tolerance Patient tolerated treatment well   Behavior During Therapy [facility] for tasks assessed/performed      Past Medical History:  Diagnosis Date  . Anemia   . Anxiety   . Chronic chest pain   . Colon polyps    inflamed partially ulcerated hyperplastic polyp  . Diabetes ([place]) 09/24/2013  . Diverticulosis   . Dysphagia esophageal phase - chronic after 3 fundoplications 6/96/2952  . Esophageal stenosis    Stricture after fundoplication REDO  . GERD (gastroesophageal reflux disease)   . Hemorrhoids   . Hypertension   . IBS (irritable bowel syndrome)   . Mitral valve prolapse   . OSA (obstructive sleep apnea)    on CPAP-Mild - Sleep study 2004  . Pulmonary embolus ([place])    2010 and 12-2013  . Shortness of breath    with exertion  . Sleep apnea    wears CPAP  . Tinnitus    Subjective  . Zoster 2014   R flank    Past Surgical History:  Procedure Laterality Date  . ABDOMINAL HYSTERECTOMY     still has 1 ovary   . APPENDECTOMY    . [place]   Stem surgery, Arnold Chiari Malformation  . BREAST REDUCTION SURGERY    . CATARACT EXTRACTION Bilateral 2017   cataracts, Dr. Herbert Deaner  . CHOLECYSTECTOMY  2008  . COLONOSCOPY    . ESOPHAGOGASTRODUODENOSCOPY    . HERNIA REPAIR     Hital Hernia  . KNEE ARTHROSCOPY  05/16/2012   Procedure: ARTHROSCOPY KNEE;   Surgeon: Sharmon Revere, MD;  Location: [place];  Service: Orthopedics;  Laterality: Left;  . NISSEN FUNDOPLICATION     X 3 lab, open x 2 last with mesh  . REDUCTION MAMMAPLASTY Bilateral 2007    There were no vitals filed for this visit.      Subjective Assessment - 11/01/16 1409    Subjective patient doing well today - no new complaints   Pertinent History L knee arthroscopy 04/2012 (meniscal injury per pt report); h/o mutiple DVT's in L LE with PE   Diagnostic tests 08/24/16 LE venous doppler: No evidence of DVT within the left lower extremity.   Patient Stated Goals "to find out what's going on with my legs and fix it; get my legs strong"   Currently in Pain? Yes   Pain Score 2    Pain Location Leg   Pain Orientation Left;Anterior;Lower   Pain Descriptors / Indicators Aching;Sore   Pain Type Chronic pain                         [facility] Adult PT Treatment/Exercise - 11/01/16 1412      Knee/Hip Exercises: Aerobic   Nustep L6 x 6 minutes  Knee/Hip Exercises: Supine   Straight Leg Raises Strengthening;Left;15 reps   Straight Leg Raises Limitations 3#   Straight Leg Raise with External Rotation Strengthening;Left;15 reps   Straight Leg Raise with External Rotation Limitations 3#     Cryotherapy   Number Minutes Cryotherapy 15 Minutes   Cryotherapy Location --  L lower leg   Type of Cryotherapy Ice pack     Manual Therapy   Manual Therapy Soft tissue mobilization   Manual therapy comments pt supine   Soft tissue mobilization STM to L anterior tib and peroneals     Ankle Exercises: Supine   Other Supine Ankle Exercises 4 way resisted ankle - green tband x 15 reps          Trigger Point Dry Needling - 11/01/16 1442    Consent Given? Yes   Education Handout Provided Yes   Muscles Treated Lower Body Tibialis anterior   Tibialis Anterior Response Twitch response elicited;Palpable increased muscle length                PT Short Term Goals -  09/25/16 1328      PT SHORT TERM GOAL #1   Title Independent with initial HEP by 09/29/16   Status Achieved           PT Long Term Goals - 10/30/16 1411      PT LONG TERM GOAL #1   Title Independent with advanced HEP +/- gym program by 12/01/16   Status Partially Met     PT LONG TERM GOAL #2   Title B LE flexibility WFL to allow functional AROM at knee and ankle by 12/01/16   Status Partially Met     PT LONG TERM GOAL #3   Title B LE strength >/= 4/5 for improved LE stability by 12/01/16   Status Partially Met     PT LONG TERM GOAL #4   Title Pt will report ability to complete daily mobility, ambulation and household chores w/o limitation due to L LE pain by 12/01/16   Status Partially Met               Plan - 11/01/16 1412    Clinical Impression Statement Patient wanting to try DN today. Patient and PT discussing precautions andjustification for treatment with patient verbally agreeing. DN to L tib anterior with twitch response noted. Will continue to progress towards goals per patient tolerance.    Clinical Impairments Affecting Rehab Potential h/o multiple DVT's & PE's on Xarelto, fibromyalgia, h/o Arnold-Chiari malformation   PT Treatment/Interventions Patient/family education;Therapeutic exercise;Neuromuscular re-education;Manual techniques;Taping;Dry needling;Electrical Stimulation;Moist Heat;Cryotherapy;Iontophoresis 90m/ml Dexamethasone;ADLs/Self Care Home Management;Balance training;Therapeutic activities;Functional mobility training;Gait training;Stair training   PT Next Visit Plan manual therapy to address increased muscle tension + taping as indicated & benefit noted; LE strengthening   Consulted and Agree with Plan of Care Patient      Patient will benefit from skilled therapeutic intervention in order to improve the following deficits and impairments:  Pain, Impaired flexibility, Decreased range of motion, Decreased strength, Increased muscle spasms, Increased  fascial restricitons, Difficulty walking  Visit Diagnosis: Pain in left lower leg  Other symptoms and signs involving the musculoskeletal system  Muscle weakness (generalized)     Problem List Patient Active Problem List   Diagnosis Date Noted  . Diarrhea 12/02/2015  . History of colonic polyps 12/02/2015  . Abdominal pain, epigastric 12/02/2015  . PCP NOTES >>>>>>>>>>>>>>>>>>>>>>>>> 02/13/2015  . Anxiety 08/04/2014  . Annual physical exam 04/29/2014  . Leg  pain, bilateral 02/19/2014  . Fibromyalgia syndrome 02/04/2014  . Dysphagia esophageal phase - chronic after 3 fundoplications 83/50/7573  . Cough 01/08/2014  . Chest pressure 01/08/2014  . DOE (dyspnea on exertion) 12/26/2013  . RUQ pain 11/16/2013  . Diabetes ([place]) 09/24/2013  . Abdominal pain, chronic, right flank -upper quadrant 07/02/2012  . Overweight(278.02) 09/30/2009  . HYPERSOMNIA, ASSOCIATED WITH SLEEP APNEA 09/28/2009  . Essential hypertension 06/24/2009  . DIZZINESS 06/24/2009  . HEADACHE 06/24/2009  . VENOUS INSUFFICIENCY, LEGS 11/19/2008  . KNEE PAIN 06/01/2008  . Acute pulmonary embolism ([place]) 05/19/2008  . Pulmonary nodule 08/26/2007  . Stricture and stenosis of esophagus 05/22/2007  . GERD 01/31/2007  . IBS (irritable bowel syndrome) 01/31/2007  . MITRAL VALVE PROLAPSE, HX OF 01/31/2007  . APPENDECTOMY, HX OF 01/31/2007     Lanney Gins, PT, DPT 11/01/16 3:09 PM   [facility] [address]  [place], [place], [address] Phone: [phone]   Fax:  [phone]  Name: Jennifer [name] MRN: [id number] Date of Birth: 1950/08/30

## 2016-11-02 ENCOUNTER — Encounter (HOSPITAL_COMMUNITY): Payer: Self-pay | Admitting: Emergency Medicine

## 2016-11-02 ENCOUNTER — Emergency Department (HOSPITAL_COMMUNITY)
Admission: EM | Admit: 2016-11-02 | Discharge: 2016-11-02 | Disposition: A | Discharge status: Home / Self Care | Payer: Federal, State, Local not specified - PPO | Attending: Emergency Medicine | Admitting: Emergency Medicine

## 2016-11-02 ENCOUNTER — Other Ambulatory Visit: Payer: Self-pay | Admitting: Internal Medicine

## 2016-11-02 DIAGNOSIS — Z79899 Other long term (current) drug therapy: Secondary | ICD-10-CM | POA: Insufficient documentation

## 2016-11-02 DIAGNOSIS — Y999 Unspecified external cause status: Secondary | ICD-10-CM | POA: Diagnosis not present

## 2016-11-02 DIAGNOSIS — Y93G1 Activity, food preparation and clean up: Secondary | ICD-10-CM | POA: Diagnosis not present

## 2016-11-02 DIAGNOSIS — X118XXA Contact with other hot tap-water, initial encounter: Secondary | ICD-10-CM | POA: Insufficient documentation

## 2016-11-02 DIAGNOSIS — E119 Type 2 diabetes mellitus without complications: Secondary | ICD-10-CM | POA: Insufficient documentation

## 2016-11-02 DIAGNOSIS — Y929 Unspecified place or not applicable: Secondary | ICD-10-CM | POA: Insufficient documentation

## 2016-11-02 DIAGNOSIS — Z7901 Long term (current) use of anticoagulants: Secondary | ICD-10-CM | POA: Insufficient documentation

## 2016-11-02 DIAGNOSIS — Z86711 Personal history of pulmonary embolism: Secondary | ICD-10-CM | POA: Diagnosis not present

## 2016-11-02 DIAGNOSIS — T23171A Burn of first degree of right wrist, initial encounter: Secondary | ICD-10-CM | POA: Diagnosis present

## 2016-11-02 MED ORDER — ACETAMINOPHEN 500 MG PO TABS: 500.0000 mg | ORAL_TABLET | Freq: Four times a day (QID) | ORAL | 0 refills | Status: AC | PRN

## 2016-11-02 MED ORDER — ACETAMINOPHEN 500 MG PO TABS
1000.0000 mg | ORAL_TABLET | Freq: Once | ORAL | Status: AC
  Administered 2016-11-02: 1000 mg via ORAL
  Filled 2016-11-02: qty 2

## 2016-11-02 MED ORDER — BACITRACIN ZINC 500 UNIT/GM EX OINT
TOPICAL_OINTMENT | Freq: Two times a day (BID) | CUTANEOUS | Status: DC
  Administered 2016-11-02: 1 via TOPICAL
  Filled 2016-11-02: qty 0.9

## 2016-11-02 MED ORDER — BACITRACIN ZINC 500 UNIT/GM EX OINT: 1.0000 "application " | TOPICAL_OINTMENT | Freq: Two times a day (BID) | CUTANEOUS | 0 refills | Status: DC

## 2016-11-02 NOTE — ED Triage Notes (Signed)
Patient was boiling water and was pour it out. Patient accidentally wasted boiling water on right lower arm and thumb. This happened an hour ago.

## 2016-11-02 NOTE — Telephone Encounter (Signed)
Pt is requesting refill on temazepam 30mg .  Last OV: 05/23/2016 w/ PCP, 08/24/2016 w/ Dr. Nani Ravens Last Fill: 05/23/2016 #30 and 4RF UDS: 11/19/2015 Low risk  [place] Controlled Substance Database printed; no issues noted.  Please advise.

## 2016-11-02 NOTE — Discharge Instructions (Signed)
Medications: Bacitracin, Tylenol  Treatment: Apply bacitracin ointment twice daily. Keep covered with nonstick gauze to help for comfort. Take Tylenol every 6 hours as needed for your pain. You can also use cool compresses.  Follow-up: Please follow-up with your primary care provider for wound recheck early next week. Please return to the emergency department if you develop any new or worsening symptoms including red streaking from the area, significant blistering, or any other new or concerning symptoms.

## 2016-11-02 NOTE — ED Notes (Signed)
Pt requesting to see PA-C stating "if nothing can be done I want to go home since my arm's not blistering."  Informed would speak to PA-C.

## 2016-11-02 NOTE — Telephone Encounter (Signed)
Okay #30, no refills. due for a OV

## 2016-11-02 NOTE — Telephone Encounter (Signed)
Rx phoned in to [facility].

## 2016-11-03 NOTE — ED Provider Notes (Signed)
[facility] DEPT Provider Note   CSN: [id number] Arrival date & time: 11/02/16  1914     History   Chief Complaint Chief Complaint  Patient presents with  . Hand Burn    HPI Jennifer [name] is a 66 y.o. female with history of PE on Xarelto, diabetes, hypertension who presents with burn to right wrist. Patient reports she was cooking and had scalding water splashed on her wrist. Patient immediately put water and ice on the burn. She also put Vaseline and mustard on the burn. Patient called a pharmacist was advised to be evaluated because she was on blood thinners. No blisters. Patient denies any other injuries, other than the burning pain to her right wrist. She does not take any medications prior to arrival.  HPI  Past Medical History:  Diagnosis Date  . Anemia   . Anxiety   . Chronic chest pain   . Colon polyps    inflamed partially ulcerated hyperplastic polyp  . Diabetes ([place]) 09/24/2013  . Diverticulosis   . Dysphagia esophageal phase - chronic after 3 fundoplications 8/76/8115  . Esophageal stenosis    Stricture after fundoplication REDO  . GERD (gastroesophageal reflux disease)   . Hemorrhoids   . Hypertension   . IBS (irritable bowel syndrome)   . Mitral valve prolapse   . OSA (obstructive sleep apnea)    on CPAP-Mild - Sleep study 2004  . Pulmonary embolus ([place])    2010 and 12-2013  . Shortness of breath    with exertion  . Sleep apnea    wears CPAP  . Tinnitus    Subjective  . Zoster 2014   R flank    Patient Active Problem List   Diagnosis Date Noted  . Diarrhea 12/02/2015  . History of colonic polyps 12/02/2015  . Abdominal pain, epigastric 12/02/2015  . PCP NOTES >>>>>>>>>>>>>>>>>>>>>>>>> 02/13/2015  . Anxiety 08/04/2014  . Annual physical exam 04/29/2014  . Leg pain, bilateral 02/19/2014  . Fibromyalgia syndrome 02/04/2014  . Dysphagia esophageal phase - chronic after 3 fundoplications 72/62/0355  . Cough 01/08/2014  . Chest pressure 01/08/2014   . DOE (dyspnea on exertion) 12/26/2013  . RUQ pain 11/16/2013  . Diabetes ([place]) 09/24/2013  . Abdominal pain, chronic, right flank -upper quadrant 07/02/2012  . Overweight(278.02) 09/30/2009  . HYPERSOMNIA, ASSOCIATED WITH SLEEP APNEA 09/28/2009  . Essential hypertension 06/24/2009  . DIZZINESS 06/24/2009  . HEADACHE 06/24/2009  . VENOUS INSUFFICIENCY, LEGS 11/19/2008  . KNEE PAIN 06/01/2008  . Acute pulmonary embolism ([place]) 05/19/2008  . Pulmonary nodule 08/26/2007  . Stricture and stenosis of esophagus 05/22/2007  . GERD 01/31/2007  . IBS (irritable bowel syndrome) 01/31/2007  . MITRAL VALVE PROLAPSE, HX OF 01/31/2007  . APPENDECTOMY, HX OF 01/31/2007    Past Surgical History:  Procedure Laterality Date  . ABDOMINAL HYSTERECTOMY     still has 1 ovary   . APPENDECTOMY    . [place]   Stem surgery, Arnold Chiari Malformation  . BREAST REDUCTION SURGERY    . CATARACT EXTRACTION Bilateral 2017   cataracts, Dr. Herbert Deaner  . CHOLECYSTECTOMY  2008  . COLONOSCOPY    . ESOPHAGOGASTRODUODENOSCOPY    . HERNIA REPAIR     Hital Hernia  . KNEE ARTHROSCOPY  05/16/2012   Procedure: ARTHROSCOPY KNEE;  Surgeon: Sharmon Revere, MD;  Location: [facility];  Service: Orthopedics;  Laterality: Left;  . NISSEN FUNDOPLICATION     X 3 lab, open x 2 last with mesh  .  REDUCTION MAMMAPLASTY Bilateral 2007    OB History    Gravida Para Term Preterm AB Living   1 1 1  0 0 1   SAB TAB Ectopic Multiple Live Births   0 0 0 0         Home Medications    Prior to Admission medications   Medication Sig Start Date End Date Taking? Authorizing Provider  acetaminophen (TYLENOL) 500 MG tablet Take 1 tablet (500 mg total) by mouth every 6 (six) hours as needed. 11/02/16   Aydeen Blume, Bea Graff, PA-C  ACIPHEX 20 MG tablet TAKE ONE TABLET BY MOUTH TWICE DAILY 08/01/16   Gatha Mayer, MD  albuterol (VENTOLIN HFA) 108 (90 Base) MCG/ACT inhaler Inhale 2 puffs into the lungs every 6 (six) hours as needed  for wheezing or shortness of breath. Patient not taking: Reported on 09/28/2016 02/16/16   Colon Branch, MD  ALPRAZolam Duanne Moron) 0.5 MG tablet Take 1 tablet (0.5 mg total) by mouth daily as needed for anxiety. 09/29/16   Colon Branch, MD  bacitracin ointment Apply 1 application topically 2 (two) times daily. 11/02/16   Casy Tavano, Bea Graff, PA-C  cycloSPORINE (RESTASIS) 0.05 % ophthalmic emulsion Place 1 drop into both eyes 2 (two) times daily.     [provider]  escitalopram (LEXAPRO) 10 MG tablet Take 1 tablet (10 mg total) by mouth daily. 08/28/16   Colon Branch, MD  glucose blood test strip Check blood sugar once daily 11/19/15   Colon Branch, MD  hydrocortisone (ANUSOL-HC) 25 MG suppository Place 1 suppository (25 mg total) rectally 2 (two) times daily as needed for hemorrhoids or itching (bleeding). 09/15/16   Gatha Mayer, MD  [facility] DELICA LANCETS 25K MISC Check blood sugar once daily 11/19/15   Colon Branch, MD  rivaroxaban (XARELTO) 10 MG TABS tablet Take 1 tablet (10 mg total) by mouth daily with supper. 02/15/16   [place], Holli Humbles, NP  temazepam (RESTORIL) 30 MG capsule Take 1 capsule (30 mg total) by mouth at bedtime as needed for sleep. 11/02/16   Colon Branch, MD  triamterene-hydrochlorothiazide (MAXZIDE-25) 37.5-25 MG tablet Take 1 tablet by mouth daily. 06/23/16   Colon Branch, MD    Family History Family History  Problem Relation Age of Onset  . Diabetes Mother   . Rheum arthritis Mother   . Hypertension Mother   . Hypertension Father   . Rheum arthritis Sister   . Hypertension Sister   . Colon cancer Maternal Grandmother   . Rheum arthritis Sister   . Rheum arthritis Sister   . Rheum arthritis Sister   . CAD Sister        MIdx age 81  . Stomach cancer Maternal Uncle   . Breast cancer Neg Hx   . Esophageal cancer Neg Hx   . Rectal cancer Neg Hx     Social History Social History  Substance Use Topics  . Smoking status: Never Smoker  . Smokeless tobacco: Never Used       Comment: never used tobacco  . Alcohol use No     Allergies   Morphine and related; Promethazine hcl; and Sulfonamide derivatives   Review of Systems Review of Systems  Constitutional: Negative for fever.  Skin: Positive for color change and wound.     Physical Exam Updated Vital Signs BP 114/62 (BP Location: Left Arm)   Pulse 65   Temp 97.7 F (36.5 C) (Oral)   Resp 18  Ht 5' 8.5" (1.74 m)   Wt 113.4 kg (250 lb)   SpO2 97%   BMI 37.46 kg/m   Physical Exam  Constitutional: She appears well-developed and well-nourished. No distress.  HENT:  Head: Normocephalic and atraumatic.  Mouth/Throat: Oropharynx is clear and moist. No oropharyngeal exudate.  Eyes: Pupils are equal, round, and reactive to light. Conjunctivae are normal. Right eye exhibits no discharge. Left eye exhibits no discharge. No scleral icterus.  Neck: Normal range of motion. Neck supple. No thyromegaly present.  Cardiovascular: Normal rate, regular rhythm, normal heart sounds and intact distal pulses.  Exam reveals no gallop and no friction rub.   No murmur heard. Pulmonary/Chest: Effort normal and breath sounds normal. No stridor. No respiratory distress. She has no wheezes. She has no rales.  Musculoskeletal: She exhibits no edema.  Lymphadenopathy:    She has no cervical adenopathy.  Neurological: She is alert. Coordination normal.  Skin: Skin is warm and dry. No rash noted. She is not diaphoretic. No pallor.  1% BSA to radial aspect of R wrist, not circumferential area of erythema and tenderness, no blistering, does not extend to hand  Psychiatric: She has a normal mood and affect.  Nursing note and vitals reviewed.    ED Treatments / Results  Labs (all labs ordered are listed, but only abnormal results are displayed) Labs Reviewed - No data to display  EKG  EKG Interpretation None       Radiology No results found.  Procedures Procedures (including critical care  time)  Medications Ordered in ED Medications  acetaminophen (TYLENOL) tablet 1,000 mg (1,000 mg Oral Given 11/02/16 2144)     Initial Impression / Assessment and Plan / ED Course  I have reviewed the triage vital signs and the nursing notes.  Pertinent labs & imaging results that were available during my care of the patient were reviewed by me and considered in my medical decision making (see chart for details).     Patient with superficial burn to the right wrist. Bacitracin ointment applied has patient has sulfa allergy. Nonstick dressing applied. No blistering or signs of infection at this time. Follow-up to PCP next week for recheck. Return precautions and wound care discussed. Patient understands and agrees with plan. Patient vitals stable throughout ED course and discharged in satisfactory condition. I discussed patient case with Dr. Sherry Ruffing who guided the patient's management and agrees with plan.   Final Clinical Impressions(s) / ED Diagnoses   Final diagnoses:  Superficial burn of right wrist, initial encounter    New Prescriptions Discharge Medication List as of 11/02/2016  9:50 PM    START taking these medications   Details  acetaminophen (TYLENOL) 500 MG tablet Take 1 tablet (500 mg total) by mouth every 6 (six) hours as needed., Starting Thu 11/02/2016, Print    bacitracin ointment Apply 1 application topically 2 (two) times daily., Starting Thu 11/02/2016, [address], [place], PA-C 11/03/16 0153    Tegeler, Gwenyth Allegra, MD 11/03/16 [phone]

## 2016-11-06 ENCOUNTER — Ambulatory Visit: Payer: Federal, State, Local not specified - PPO | Admitting: Physical Therapy

## 2016-11-08 ENCOUNTER — Ambulatory Visit: Payer: Federal, State, Local not specified - PPO | Admitting: Physical Therapy

## 2016-11-08 DIAGNOSIS — M6281 Muscle weakness (generalized): Secondary | ICD-10-CM

## 2016-11-08 DIAGNOSIS — M79662 Pain in left lower leg: Secondary | ICD-10-CM | POA: Diagnosis not present

## 2016-11-08 DIAGNOSIS — R29898 Other symptoms and signs involving the musculoskeletal system: Secondary | ICD-10-CM

## 2016-11-08 NOTE — Therapy (Signed)
[facility] [address]  [place], [place], [address] Phone: [phone]   Fax:  [phone]  Physical Therapy Treatment  Patient Details  Name: Jennifer [name] MRN: [id number] Date of Birth: [birth year] Referring Provider: Ames Coupe, DO  Encounter Date: 11/08/2016      PT End of Session - 11/08/16 1401    Visit Number 12   Number of Visits 18   Date for PT Re-Evaluation 12/01/16   Authorization Type Federal [employer]   PT Start Time 1401   PT Stop Time 7741  icepack at end of session   PT Time Calculation (min) 51 min   Activity Tolerance Patient tolerated treatment well   Behavior During Therapy [facility] for tasks assessed/performed      Past Medical History:  Diagnosis Date  . Anemia   . Anxiety   . Chronic chest pain   . Colon polyps    inflamed partially ulcerated hyperplastic polyp  . Diabetes ([place]) 09/24/2013  . Diverticulosis   . Dysphagia esophageal phase - chronic after 3 fundoplications 2/87/8676  . Esophageal stenosis    Stricture after fundoplication REDO  . GERD (gastroesophageal reflux disease)   . Hemorrhoids   . Hypertension   . IBS (irritable bowel syndrome)   . Mitral valve prolapse   . OSA (obstructive sleep apnea)    on CPAP-Mild - Sleep study 2004  . Pulmonary embolus ([place])    2010 and 12-2013  . Shortness of breath    with exertion  . Sleep apnea    wears CPAP  . Tinnitus    Subjective  . Zoster 2014   R flank    Past Surgical History:  Procedure Laterality Date  . ABDOMINAL HYSTERECTOMY     still has 1 ovary   . APPENDECTOMY    . [place]   Stem surgery, Arnold Chiari Malformation  . BREAST REDUCTION SURGERY    . CATARACT EXTRACTION Bilateral 2017   cataracts, Dr. Herbert Deaner  . CHOLECYSTECTOMY  2008  . COLONOSCOPY    . ESOPHAGOGASTRODUODENOSCOPY    . HERNIA REPAIR     Hital Hernia  . KNEE ARTHROSCOPY  05/16/2012   Procedure: ARTHROSCOPY KNEE;   Surgeon: Sharmon Revere, MD;  Location: [facility];  Service: Orthopedics;  Laterality: Left;  . NISSEN FUNDOPLICATION     X 3 lab, open x 2 last with mesh  . REDUCTION MAMMAPLASTY Bilateral 2007    There were no vitals filed for this visit.      Subjective Assessment - 11/08/16 1403    Subjective Went to MD for burn to R wrist; leg is feeling well since DN at last session - "I'm really surprised at how much its feeling better"   Pertinent History L knee arthroscopy 04/2012 (meniscal injury per pt report); h/o mutiple DVT's in L LE with PE   Diagnostic tests 08/24/16 LE venous doppler: No evidence of DVT within the left lower extremity.   Patient Stated Goals "to find out what's going on with my legs and fix it; get my legs strong"   Currently in Pain? Yes   Pain Score 3    Pain Location Leg   Pain Orientation Left   Pain Descriptors / Indicators Aching                         [facility] Adult PT Treatment/Exercise - 11/08/16 1406  Knee/Hip Exercises: Aerobic   Nustep L6 x 6 minutes     Knee/Hip Exercises: Standing   Forward Lunges Right;Left;10 reps   Forward Lunges Limitations front foot on BOSU   SLS L SLS on foam - 4 cone taps x 10     Cryotherapy   Number Minutes Cryotherapy 12 Minutes   Cryotherapy Location --  L lower leg   Type of Cryotherapy Ice pack     Manual Therapy   Manual Therapy Soft tissue mobilization   Manual therapy comments pt supine   Soft tissue mobilization STM to L anterior tib and peroneals     Ankle Exercises: Standing   Heel Walk (Round Trip) 1 lap   Toe Walk (Round Trip) 1 lap     Ankle Exercises: Stretches   Gastroc Stretch 3 reps;30 seconds   Gastroc Stretch Limitations L: prostretch          Trigger Point Dry Needling - 11/08/16 1430    Consent Given? Yes   Muscles Treated Lower Body Tibialis anterior;Soleus   Soleus Response Twitch response elicited;Palpable increased muscle length   Tibialis Anterior Response Twitch  response elicited;Palpable increased muscle length                PT Short Term Goals - 09/25/16 1328      PT SHORT TERM GOAL #1   Title Independent with initial HEP by 09/29/16   Status Achieved           PT Long Term Goals - 10/30/16 1411      PT LONG TERM GOAL #1   Title Independent with advanced HEP +/- gym program by 12/01/16   Status Partially Met     PT LONG TERM GOAL #2   Title B LE flexibility WFL to allow functional AROM at knee and ankle by 12/01/16   Status Partially Met     PT LONG TERM GOAL #3   Title B LE strength >/= 4/5 for improved LE stability by 12/01/16   Status Partially Met     PT LONG TERM GOAL #4   Title Pt will report ability to complete daily mobility, ambulation and household chores w/o limitation due to L LE pain by 12/01/16   Status Partially Met               Plan - 11/08/16 1406    Clinical Impression Statement Patient noting great relief of L lower leg pain since DN at last session with patient wishing to proceed with treatment for further pain relief. Patient doing well with all LE strengthening tasks today - Some balance deficits noted with SLS on compliant surface. Progressing well towards goals.    Clinical Impairments Affecting Rehab Potential h/o multiple DVT's & PE's on Xarelto, fibromyalgia, h/o Arnold-Chiari malformation   PT Treatment/Interventions Patient/family education;Therapeutic exercise;Neuromuscular re-education;Manual techniques;Taping;Dry needling;Electrical Stimulation;Moist Heat;Cryotherapy;Iontophoresis 69m/ml Dexamethasone;ADLs/Self Care Home Management;Balance training;Therapeutic activities;Functional mobility training;Gait training;Stair training   PT Next Visit Plan manual therapy to address increased muscle tension + taping as indicated & benefit noted; LE strengthening   Consulted and Agree with Plan of Care Patient      Patient will benefit from skilled therapeutic intervention in order to improve the  following deficits and impairments:  Pain, Impaired flexibility, Decreased range of motion, Decreased strength, Increased muscle spasms, Increased fascial restricitons, Difficulty walking  Visit Diagnosis: Pain in left lower leg  Other symptoms and signs involving the musculoskeletal system  Muscle weakness (generalized)     Problem List  Patient Active Problem List   Diagnosis Date Noted  . Diarrhea 12/02/2015  . History of colonic polyps 12/02/2015  . Abdominal pain, epigastric 12/02/2015  . PCP NOTES >>>>>>>>>>>>>>>>>>>>>>>>> 02/13/2015  . Anxiety 08/04/2014  . Annual physical exam 04/29/2014  . Leg pain, bilateral 02/19/2014  . Fibromyalgia syndrome 02/04/2014  . Dysphagia esophageal phase - chronic after 3 fundoplications 53/29/9242  . Cough 01/08/2014  . Chest pressure 01/08/2014  . DOE (dyspnea on exertion) 12/26/2013  . RUQ pain 11/16/2013  . Diabetes ([place]) 09/24/2013  . Abdominal pain, chronic, right flank -upper quadrant 07/02/2012  . Overweight(278.02) 09/30/2009  . HYPERSOMNIA, ASSOCIATED WITH SLEEP APNEA 09/28/2009  . Essential hypertension 06/24/2009  . DIZZINESS 06/24/2009  . HEADACHE 06/24/2009  . VENOUS INSUFFICIENCY, LEGS 11/19/2008  . KNEE PAIN 06/01/2008  . Acute pulmonary embolism ([place]) 05/19/2008  . Pulmonary nodule 08/26/2007  . Stricture and stenosis of esophagus 05/22/2007  . GERD 01/31/2007  . IBS (irritable bowel syndrome) 01/31/2007  . MITRAL VALVE PROLAPSE, HX OF 01/31/2007  . APPENDECTOMY, HX OF 01/31/2007     Lanney Gins, PT, DPT 11/08/16 4:30 PM   [facility] [address]  [place], [place], [address] Phone: [phone]   Fax:  [phone]  Name: Jennifer [name] MRN: [id number] Date of Birth: [birth year]

## 2016-11-13 ENCOUNTER — Ambulatory Visit: Payer: Federal, State, Local not specified - PPO | Admitting: Physical Therapy

## 2016-11-15 ENCOUNTER — Ambulatory Visit: Payer: Federal, State, Local not specified - PPO | Admitting: Physical Therapy

## 2016-11-15 DIAGNOSIS — M79662 Pain in left lower leg: Secondary | ICD-10-CM

## 2016-11-15 DIAGNOSIS — R29898 Other symptoms and signs involving the musculoskeletal system: Secondary | ICD-10-CM

## 2016-11-15 DIAGNOSIS — M6281 Muscle weakness (generalized): Secondary | ICD-10-CM

## 2016-11-15 NOTE — Therapy (Signed)
[facility] [address]  [place], [place], [address] Phone: [phone]   Fax:  [phone]  Physical Therapy Treatment  Patient Details  Name: Jennifer [name] MRN: [id number] Date of Birth: May 28, 1950 Referring Provider: Ames Coupe, DO  Encounter Date: 11/15/2016      PT End of Session - 11/15/16 1410    Visit Number 13   Number of Visits 18   Date for PT Re-Evaluation 12/01/16   Authorization Type Federal [employer] & [employer]   PT Start Time 1402   PT Stop Time 1440   PT Time Calculation (min) 38 min   Activity Tolerance Patient tolerated treatment well   Behavior During Therapy [facility] for tasks assessed/performed      Past Medical History:  Diagnosis Date  . Anemia   . Anxiety   . Chronic chest pain   . Colon polyps    inflamed partially ulcerated hyperplastic polyp  . Diabetes ([place]) 09/24/2013  . Diverticulosis   . Dysphagia esophageal phase - chronic after 3 fundoplications 12/04/7515  . Esophageal stenosis    Stricture after fundoplication REDO  . GERD (gastroesophageal reflux disease)   . Hemorrhoids   . Hypertension   . IBS (irritable bowel syndrome)   . Mitral valve prolapse   . OSA (obstructive sleep apnea)    on CPAP-Mild - Sleep study 2004  . Pulmonary embolus ([place])    2010 and 12-2013  . Shortness of breath    with exertion  . Sleep apnea    wears CPAP  . Tinnitus    Subjective  . Zoster 2014   R flank    Past Surgical History:  Procedure Laterality Date  . ABDOMINAL HYSTERECTOMY     still has 1 ovary   . APPENDECTOMY    . [place]   Stem surgery, Arnold Chiari Malformation  . BREAST REDUCTION SURGERY    . CATARACT EXTRACTION Bilateral 2017   cataracts, Dr. Herbert Deaner  . CHOLECYSTECTOMY  2008  . COLONOSCOPY    . ESOPHAGOGASTRODUODENOSCOPY    . HERNIA REPAIR     Hital Hernia  . KNEE ARTHROSCOPY  05/16/2012   Procedure: ARTHROSCOPY KNEE;  Surgeon: Sharmon Revere, MD;   Location: [facility];  Service: Orthopedics;  Laterality: Left;  . NISSEN FUNDOPLICATION     X 3 lab, open x 2 last with mesh  . REDUCTION MAMMAPLASTY Bilateral 2007    There were no vitals filed for this visit.      Subjective Assessment - 11/15/16 1408    Subjective having some posterior lower leg pain - feels like there is a large knot   Pertinent History L knee arthroscopy 04/2012 (meniscal injury per pt report); h/o mutiple DVT's in L LE with PE   Diagnostic tests 08/24/16 LE venous doppler: No evidence of DVT within the left lower extremity.   Patient Stated Goals "to find out what's going on with my legs and fix it; get my legs strong"   Currently in Pain? Yes   Pain Score 6    Pain Location Leg   Pain Orientation Left;Posterior   Pain Descriptors / Indicators Aching;Tender                         [facility] Adult PT Treatment/Exercise - 11/15/16 1413      Knee/Hip Exercises: Stretches   Gastroc Stretch Left;30 seconds;3 reps   Company secretary  Knee/Hip Exercises: Aerobic   Nustep L6 x 6 minutes     Knee/Hip Exercises: Machines for Strengthening   Cybex Knee Extension 25# B LE 2 15     Knee/Hip Exercises: Standing   Heel Raises 15 reps;2 sets   Heel Raises Limitations 2nd set with eccentric lowering of L LE   Forward Lunges Right;Left;15 reps   Forward Lunges Limitations front foot on BOSU   Functional Squat 15 reps   Functional Squat Limitations TRX                  PT Short Term Goals - 09/25/16 1328      PT SHORT TERM GOAL #1   Title Independent with initial HEP by 09/29/16   Status Achieved           PT Long Term Goals - 10/30/16 1411      PT LONG TERM GOAL #1   Title Independent with advanced HEP +/- gym program by 12/01/16   Status Partially Met     PT LONG TERM GOAL #2   Title B LE flexibility WFL to allow functional AROM at knee and ankle by 12/01/16   Status Partially Met     PT LONG TERM GOAL #3    Title B LE strength >/= 4/5 for improved LE stability by 12/01/16   Status Partially Met     PT LONG TERM GOAL #4   Title Pt will report ability to complete daily mobility, ambulation and household chores w/o limitation due to L LE pain by 12/01/16   Status Partially Met               Plan - 11/15/16 1410    Clinical Impression Statement Patient today with some posterior lower leg pain - palpable nodule felt. PT assessed for DVT with no redness, warmth, or difference in calf circumference. Alerted aptient to signs/symptoms of DVTand to seek care if noted with aptient demonstrating good understanding. Will continue to progress as patient tolerates.    Clinical Impairments Affecting Rehab Potential h/o multiple DVT's & PE's on Xarelto, fibromyalgia, h/o Arnold-Chiari malformation   PT Treatment/Interventions Patient/family education;Therapeutic exercise;Neuromuscular re-education;Manual techniques;Taping;Dry needling;Electrical Stimulation;Moist Heat;Cryotherapy;Iontophoresis 29m/ml Dexamethasone;ADLs/Self Care Home Management;Balance training;Therapeutic activities;Functional mobility training;Gait training;Stair training   PT Next Visit Plan manual therapy to address increased muscle tension + taping as indicated & benefit noted; LE strengthening   Consulted and Agree with Plan of Care Patient      Patient will benefit from skilled therapeutic intervention in order to improve the following deficits and impairments:  Pain, Impaired flexibility, Decreased range of motion, Decreased strength, Increased muscle spasms, Increased fascial restricitons, Difficulty walking  Visit Diagnosis: Pain in left lower leg  Other symptoms and signs involving the musculoskeletal system  Muscle weakness (generalized)     Problem List Patient Active Problem List   Diagnosis Date Noted  . Diarrhea 12/02/2015  . History of colonic polyps 12/02/2015  . Abdominal pain, epigastric 12/02/2015  . PCP  NOTES >>>>>>>>>>>>>>>>>>>>>>>>> 02/13/2015  . Anxiety 08/04/2014  . Annual physical exam 04/29/2014  . Leg pain, bilateral 02/19/2014  . Fibromyalgia syndrome 02/04/2014  . Dysphagia esophageal phase - chronic after 3 fundoplications 078/46/9629 . Cough 01/08/2014  . Chest pressure 01/08/2014  . DOE (dyspnea on exertion) 12/26/2013  . RUQ pain 11/16/2013  . Diabetes ([place] 09/24/2013  . Abdominal pain, chronic, right flank -upper quadrant 07/02/2012  . Overweight(278.02) 09/30/2009  . HYPERSOMNIA, ASSOCIATED WITH SLEEP APNEA 09/28/2009  . Essential  hypertension 06/24/2009  . DIZZINESS 06/24/2009  . HEADACHE 06/24/2009  . VENOUS INSUFFICIENCY, LEGS 11/19/2008  . KNEE PAIN 06/01/2008  . Acute pulmonary embolism ([place]) 05/19/2008  . Pulmonary nodule 08/26/2007  . Stricture and stenosis of esophagus 05/22/2007  . GERD 01/31/2007  . IBS (irritable bowel syndrome) 01/31/2007  . MITRAL VALVE PROLAPSE, HX OF 01/31/2007  . APPENDECTOMY, HX OF 01/31/2007     Lanney Gins, PT, DPT 11/15/16 3:52 PM   [facility] [address]  [place], [place], [address] Phone: [phone]   Fax:  [phone]  Name: Jennifer [name] MRN: [id number] Date of Birth: 11/03/1950

## 2016-11-20 ENCOUNTER — Ambulatory Visit: Payer: Federal, State, Local not specified - PPO | Admitting: Physical Therapy

## 2016-11-20 DIAGNOSIS — M6281 Muscle weakness (generalized): Secondary | ICD-10-CM

## 2016-11-20 DIAGNOSIS — M79662 Pain in left lower leg: Secondary | ICD-10-CM | POA: Diagnosis not present

## 2016-11-20 DIAGNOSIS — R29898 Other symptoms and signs involving the musculoskeletal system: Secondary | ICD-10-CM

## 2016-11-20 NOTE — Therapy (Signed)
[facility] [address]  [place], [place], [address] Phone: [phone]   Fax:  [phone]  Physical Therapy Treatment  Patient Details  Name: Jennifer [name] MRN: [id number] Date of Birth: 1950/07/18 Referring Provider: Ames Coupe, DO  Encounter Date: 11/20/2016      PT End of Session - 11/20/16 1449    Visit Number 14   Number of Visits 18   Date for PT Re-Evaluation 12/01/16   Authorization Type Federal BC[employer] & [employer]   PT Start Time 1407   PT Stop Time 1450   PT Time Calculation (min) 43 min   Activity Tolerance Patient tolerated treatment well   Behavior During Therapy [facility] for tasks assessed/performed      Past Medical History:  Diagnosis Date  . Anemia   . Anxiety   . Chronic chest pain   . Colon polyps    inflamed partially ulcerated hyperplastic polyp  . Diabetes ([place]) 09/24/2013  . Diverticulosis   . Dysphagia esophageal phase - chronic after 3 fundoplications 0/25/8527  . Esophageal stenosis    Stricture after fundoplication REDO  . GERD (gastroesophageal reflux disease)   . Hemorrhoids   . Hypertension   . IBS (irritable bowel syndrome)   . Mitral valve prolapse   . OSA (obstructive sleep apnea)    on CPAP-Mild - Sleep study 2004  . Pulmonary embolus ([place])    2010 and 12-2013  . Shortness of breath    with exertion  . Sleep apnea    wears CPAP  . Tinnitus    Subjective  . Zoster 2014   R flank    Past Surgical History:  Procedure Laterality Date  . ABDOMINAL HYSTERECTOMY     still has 1 ovary   . APPENDECTOMY    . [place]   Stem surgery, Arnold Chiari Malformation  . BREAST REDUCTION SURGERY    . CATARACT EXTRACTION Bilateral 2017   cataracts, Dr. Herbert Deaner  . CHOLECYSTECTOMY  2008  . COLONOSCOPY    . ESOPHAGOGASTRODUODENOSCOPY    . HERNIA REPAIR     Hital Hernia  . KNEE ARTHROSCOPY  05/16/2012   Procedure: ARTHROSCOPY KNEE;  Surgeon: Sharmon Revere, MD;   Location: [place];  Service: Orthopedics;  Laterality: Left;  . NISSEN FUNDOPLICATION     X 3 lab, open x 2 last with mesh  . REDUCTION MAMMAPLASTY Bilateral 2007    There were no vitals filed for this visit.      Subjective Assessment - 11/20/16 1410    Subjective reports complete resolution of anterior L lower leg pain; slight soreness in posterior L LE   Pertinent History L knee arthroscopy 04/2012 (meniscal injury per pt report); h/o mutiple DVT's in L LE with PE   Diagnostic tests 08/24/16 LE venous doppler: No evidence of DVT within the left lower extremity.   Patient Stated Goals "to find out what's going on with my legs and fix it; get my legs strong"   Currently in Pain? Yes   Pain Score 3    Pain Location Leg   Pain Orientation Left;Posterior;Lower   Pain Descriptors / Indicators Sore                         [facility] Adult PT Treatment/Exercise - 11/20/16 0001      Knee/Hip Exercises: Stretches   Passive Hamstring Stretch Left;3 reps;30 seconds   Passive Hamstring Stretch  Limitations supine with strap     Knee/Hip Exercises: Aerobic   Nustep L6 x 6 minutes     Knee/Hip Exercises: Machines for Strengthening   Cybex Knee Extension 25# BLE 2 x 15   Cybex Knee Flexion 25# BLE 2 x 15     Knee/Hip Exercises: Standing   Heel Raises Right;Left;20 reps   Heel Raises Limitations alternating eccentrics   Functional Squat 15 reps   Functional Squat Limitations TRX     Ankle Exercises: Standing   Heel Walk (Round Trip) 1 lap   Toe Walk (Round Trip) 1 lap     Ankle Exercises: Stretches   Gastroc Stretch 3 reps;30 seconds   Gastroc Stretch Limitations L: prostretch                  PT Short Term Goals - 09/25/16 1328      PT SHORT TERM GOAL #1   Title Independent with initial HEP by 09/29/16   Status Achieved           PT Long Term Goals - 10/30/16 1411      PT LONG TERM GOAL #1   Title Independent with advanced HEP +/- gym program by  12/01/16   Status Partially Met     PT LONG TERM GOAL #2   Title B LE flexibility WFL to allow functional AROM at knee and ankle by 12/01/16   Status Partially Met     PT LONG TERM GOAL #3   Title B LE strength >/= 4/5 for improved LE stability by 12/01/16   Status Partially Met     PT LONG TERM GOAL #4   Title Pt will report ability to complete daily mobility, ambulation and household chores w/o limitation due to L LE pain by 12/01/16   Status Partially Met               Plan - 11/20/16 1449    Clinical Impression Statement patient reporting resolution of L anterior lower leg pain, however, recent flare up of posterior lower L LE with associated palpable nodule is patients primary concern. Patient doing well with all stretching and strengthening activities with no increase in pain. MD follow-up on Wednesday with PT advising patient to bring nodule to MD's attention. WIll discuss POC following MD appointment.    PT Treatment/Interventions Patient/family education;Therapeutic exercise;Neuromuscular re-education;Manual techniques;Taping;Dry needling;Electrical Stimulation;Moist Heat;Cryotherapy;Iontophoresis 68m/ml Dexamethasone;ADLs/Self Care Home Management;Balance training;Therapeutic activities;Functional mobility training;Gait training;Stair training   Consulted and Agree with Plan of Care Patient      Patient will benefit from skilled therapeutic intervention in order to improve the following deficits and impairments:  Pain, Impaired flexibility, Decreased range of motion, Decreased strength, Increased muscle spasms, Increased fascial restricitons, Difficulty walking  Visit Diagnosis: Pain in left lower leg  Other symptoms and signs involving the musculoskeletal system  Muscle weakness (generalized)     Problem List Patient Active Problem List   Diagnosis Date Noted  . Diarrhea 12/02/2015  . History of colonic polyps 12/02/2015  . Abdominal pain, epigastric 12/02/2015   . PCP NOTES >>>>>>>>>>>>>>>>>>>>>>>>> 02/13/2015  . Anxiety 08/04/2014  . Annual physical exam 04/29/2014  . Leg pain, bilateral 02/19/2014  . Fibromyalgia syndrome 02/04/2014  . Dysphagia esophageal phase - chronic after 3 fundoplications 072/53/6644 . Cough 01/08/2014  . Chest pressure 01/08/2014  . DOE (dyspnea on exertion) 12/26/2013  . RUQ pain 11/16/2013  . Diabetes ([place] 09/24/2013  . Abdominal pain, chronic, right flank -upper quadrant 07/02/2012  . Overweight(278.02)  09/30/2009  . HYPERSOMNIA, ASSOCIATED WITH SLEEP APNEA 09/28/2009  . Essential hypertension 06/24/2009  . DIZZINESS 06/24/2009  . HEADACHE 06/24/2009  . VENOUS INSUFFICIENCY, LEGS 11/19/2008  . KNEE PAIN 06/01/2008  . Acute pulmonary embolism ([place]) 05/19/2008  . Pulmonary nodule 08/26/2007  . Stricture and stenosis of esophagus 05/22/2007  . GERD 01/31/2007  . IBS (irritable bowel syndrome) 01/31/2007  . MITRAL VALVE PROLAPSE, HX OF 01/31/2007  . APPENDECTOMY, HX OF 01/31/2007     Lanney Gins, PT, DPT 11/20/16 2:55 PM   [facility] [address]  [place], [place], [address] Phone: [phone]   Fax:  [phone]  Name: Jennifer [name] MRN: [id number] Date of Birth: 02/07/1951

## 2016-11-22 ENCOUNTER — Other Ambulatory Visit: Payer: Self-pay | Admitting: Internal Medicine

## 2016-11-22 ENCOUNTER — Ambulatory Visit: Payer: Federal, State, Local not specified - PPO | Attending: Family Medicine | Admitting: Physical Therapy

## 2016-11-22 ENCOUNTER — Encounter: Payer: Self-pay | Admitting: Internal Medicine

## 2016-11-22 ENCOUNTER — Ambulatory Visit (INDEPENDENT_AMBULATORY_CARE_PROVIDER_SITE_OTHER): Payer: Federal, State, Local not specified - PPO | Admitting: Internal Medicine

## 2016-11-22 VITALS — BP 132/64 | HR 74 | Temp 98.2°F | Resp 14 | Ht 69.0 in | Wt 253.0 lb

## 2016-11-22 DIAGNOSIS — M6281 Muscle weakness (generalized): Secondary | ICD-10-CM | POA: Insufficient documentation

## 2016-11-22 DIAGNOSIS — M79662 Pain in left lower leg: Secondary | ICD-10-CM | POA: Insufficient documentation

## 2016-11-22 DIAGNOSIS — E119 Type 2 diabetes mellitus without complications: Secondary | ICD-10-CM | POA: Diagnosis not present

## 2016-11-22 DIAGNOSIS — M7989 Other specified soft tissue disorders: Secondary | ICD-10-CM

## 2016-11-22 DIAGNOSIS — R29898 Other symptoms and signs involving the musculoskeletal system: Secondary | ICD-10-CM | POA: Insufficient documentation

## 2016-11-22 DIAGNOSIS — R5383 Other fatigue: Secondary | ICD-10-CM | POA: Diagnosis not present

## 2016-11-22 DIAGNOSIS — I10 Essential (primary) hypertension: Secondary | ICD-10-CM

## 2016-11-22 DIAGNOSIS — R2242 Localized swelling, mass and lump, left lower limb: Secondary | ICD-10-CM | POA: Diagnosis not present

## 2016-11-22 NOTE — Progress Notes (Signed)
Pre visit review using our clinic review tool, if applicable. No additional management support is needed unless otherwise documented below in the visit note. 

## 2016-11-22 NOTE — Patient Instructions (Addendum)
GO TO THE LAB : Get the blood work  . Also provide a urine sample for a UDS   GO TO THE FRONT DESK Schedule your next appointment for a  physical exam in 4 months

## 2016-11-22 NOTE — Progress Notes (Signed)
Subjective:    Patient ID: Jennifer [name], female    DOB: June 24, 1950, 66 y.o.   MRN: [id number]  DOS:  11/22/2016 Type of visit - description : rov Interval history: We discussed several issues. Taking Lexapro, Xanax and temazepam as prescribed. Was seen with left leg pain , few months ago by another provider. Ultrasound was negative for DVT. She is doing physical therapy and feeling better. About a month ago developed a tender  lump , located on the left leg. About 2 years ago had hematoma on the area, no injuries since. Also reports she is "tired all the time" and sometimes DOE. This is going on for more than a year.  Denies depression. No chest pain, no lower extremity edema or palpitations. No orthopnea. She does go to the gym, has a Physiological scientist, is able to follow-up her routine without major problems but   feels tired.    Review of Systems See above.   Past Medical History:  Diagnosis Date  . Anemia   . Anxiety   . Chronic chest pain   . Colon polyps    inflamed partially ulcerated hyperplastic polyp  . Diabetes ([place]) 09/24/2013  . Diverticulosis   . Dysphagia esophageal phase - chronic after 3 fundoplications 10/19/3660  . Esophageal stenosis    Stricture after fundoplication REDO  . GERD (gastroesophageal reflux disease)   . Hemorrhoids   . Hypertension   . IBS (irritable bowel syndrome)   . Mitral valve prolapse   . OSA (obstructive sleep apnea)    on CPAP-Mild - Sleep study 2004  . Pulmonary embolus ([place])    2010 and 12-2013  . Shortness of breath    with exertion  . Sleep apnea    wears CPAP  . Tinnitus    Subjective  . Zoster 2014   R flank    Past Surgical History:  Procedure Laterality Date  . ABDOMINAL HYSTERECTOMY     still has 1 ovary   . APPENDECTOMY    . [place]   Stem surgery, Arnold Chiari Malformation  . BREAST REDUCTION SURGERY    . CATARACT EXTRACTION Bilateral 2017   cataracts, Dr. Herbert Deaner  . CHOLECYSTECTOMY  2008  .  COLONOSCOPY    . ESOPHAGOGASTRODUODENOSCOPY    . HERNIA REPAIR     Hital Hernia  . KNEE ARTHROSCOPY  05/16/2012   Procedure: ARTHROSCOPY KNEE;  Surgeon: Sharmon Revere, MD;  Location: [place];  Service: Orthopedics;  Laterality: Left;  . NISSEN FUNDOPLICATION     X 3 lab, open x 2 last with mesh  . REDUCTION MAMMAPLASTY Bilateral 2007    Social History   Social History  . Marital status: Married    Spouse name: N/A  . Number of children: 1  . Years of education: N/A   Occupational History  . retired form the [employer] 04-2015 [employer]   Social History Main Topics  . Smoking status: Never Smoker  . Smokeless tobacco: Never Used     Comment: never used tobacco  . Alcohol use No  . Drug use: No  . Sexual activity: Yes    Birth control/ protection: None   Other Topics Concern  . Not on file   Social History Narrative   [employer] Graduate - Technical school   Married '70 for 2 years, divorced; re-married '86 for 2.5 years, divorced; re-married '90 for 1 year, divorced; re-married '00   1 son - '72   Sister -  Died @ [address] [place]          Allergies as of 11/22/2016      Reactions   Morphine And Related Anaphylaxis, Shortness Of Breath   Promethazine Hcl Other (See Comments)   Hallucinations   Sulfonamide Derivatives Hives      Medication List       Accurate as of 11/22/16 11:59 PM. Always use your most recent med list.          acetaminophen 500 MG tablet Commonly known as:  TYLENOL Take 1 tablet (500 mg total) by mouth every 6 (six) hours as needed.   ACIPHEX 20 MG tablet Generic drug:  RABEprazole TAKE ONE TABLET BY MOUTH TWICE DAILY   albuterol 108 (90 Base) MCG/ACT inhaler Commonly known as:  VENTOLIN HFA Inhale 2 puffs into the lungs every 6 (six) hours as needed for wheezing or shortness of breath.   ALPRAZolam 0.5 MG tablet Commonly known as:  XANAX Take 1 tablet (0.5 mg total) by mouth daily as needed for anxiety.   bacitracin ointment Apply 1  application topically 2 (two) times daily.   cycloSPORINE 0.05 % ophthalmic emulsion Commonly known as:  RESTASIS Place 1 drop into both eyes 2 (two) times daily.   escitalopram 10 MG tablet Commonly known as:  LEXAPRO Take 1 tablet (10 mg total) by mouth daily.   glucose blood test strip Check blood sugar once daily   hydrocortisone 25 MG suppository Commonly known as:  ANUSOL-HC Place 1 suppository (25 mg total) rectally 2 (two) times daily as needed for hemorrhoids or itching (bleeding).   ONETOUCH DELICA LANCETS 76B Misc Check blood sugar once daily   rivaroxaban 10 MG Tabs tablet Commonly known as:  XARELTO Take 1 tablet (10 mg total) by mouth daily with supper.   temazepam 30 MG capsule Commonly known as:  RESTORIL Take 1 capsule (30 mg total) by mouth at bedtime as needed for sleep.   triamterene-hydrochlorothiazide 37.5-25 MG tablet Commonly known as:  MAXZIDE-25 Take 1 tablet by mouth daily.          Objective:   Physical Exam  Musculoskeletal:       Legs:  BP 132/64 (BP Location: Left Arm, Patient Position: Sitting, Cuff Size: Normal)   Pulse 74   Temp 98.2 F (36.8 C) (Oral)   Resp 14   Ht 5\' 9"  (1.753 m)   Wt 253 lb (114.8 kg)   SpO2 97%   BMI 37.36 kg/m  General:   Well developed, well nourished . NAD.  HEENT:  Normocephalic . Face symmetric, atraumatic Lungs:  CTA B Normal respiratory effort, no intercostal retractions, no accessory muscle use. Heart: RRR,  no murmur.  Lower extremities: Calves symmetric, no pitting edema. See graphic Skin: Not pale. Not jaundice Neurologic:  alert & oriented X3.  Speech normal, gait appropriate for age and unassisted Psych--  Cognition and judgment appear intact.  Cooperative with normal attention span and concentration.  Behavior appropriate. No anxious or depressed appearing.      Assessment & Plan:   Assessment > DM, + neuropathy. Foot exam 04-2016 HTN Anxiety, insomnia: On restoril prn,  lexapro, xanax for flying only Thyroid nodule: Bx (non neoplastic goiter) 2014,  [place] 05-2016, next 2,3 years Pulmonary: --OSA, sleep study 2004, on a CPAP --Pulmonary emboli --2010 and 12-2013 - saw hematology 12-2014, rec anticoag x 2 years (until 12-2015), then xarelto decreased to 10 mg qd x 1 year -- multiple pulmonary nodule, last CT 11-2013:  No further imaging suggested Fibromyalgia CV: --MVP --  ectatic R carotid artery, [place] 05-2016, next 2 years GI: --GERD, Esophageal stenosis --chronic dysphagia s/p  fundoplication 3  -- IBS -- chronic right flank,RUQ pain : since GB surgery 2008 Etiology not clear but likely multifactorial --> Adhesions from the surgery, neuropathy (DM), postherpetic neuralgia, scarring from PE, etc.  MRI T spine done 06-2014: DJD  Zoster --  2014 right flank   PLAN DM: Diet control, check A1c HTN: Seems controlled on Maxide, check a BMP. Anxiety, insomnia:  Restoril for sleep and sporadically takes Xanax for flying. UDS and contract today Fatigue, some DOE: Able to go to the gym and seems to feels appropriately tired after exercise. ROS otherwise (-), no edema on exam. Recent CBC showed no anemia. History of fibromyalgia: ? related. Will check a TSH, vitamin Q-H47, folic acid. Otherwise observation Left leg pain: Improving on physical therapy Left calf lump: I review my old notes from 2016, she indeed had a hematoma on the the area and even then had a induration .  Refer  to sports medicine for consideration of a soft tissue ultrasound.  MRI?Marland Kitchen Warm compresses for now. Anticoagulation:  To see hematology in few weeks RTC 4 months, CPX

## 2016-11-22 NOTE — Therapy (Signed)
[facility] [address]  [place], [place], [address] Phone: [phone]   Fax:  [phone]  Physical Therapy Treatment  Patient Details  Name: Jennifer [name] MRN: [id number] Date of Birth: 07-24-1950 Referring Provider: Ames Coupe, DO  Encounter Date: 11/22/2016      PT End of Session - 11/22/16 1411    Visit Number 15   Number of Visits 18   Date for PT Re-Evaluation 12/01/16   Authorization Type Federal BCBS & T[employer]   PT Start Time 1408   PT Stop Time 1446   PT Time Calculation (min) 38 min   Activity Tolerance Patient tolerated treatment well   Behavior During Therapy [facility] for tasks assessed/performed      Past Medical History:  Diagnosis Date  . Anemia   . Anxiety   . Chronic chest pain   . Colon polyps    inflamed partially ulcerated hyperplastic polyp  . Diabetes ([place]) 09/24/2013  . Diverticulosis   . Dysphagia esophageal phase - chronic after 3 fundoplications 09/08/3356  . Esophageal stenosis    Stricture after fundoplication REDO  . GERD (gastroesophageal reflux disease)   . Hemorrhoids   . Hypertension   . IBS (irritable bowel syndrome)   . Mitral valve prolapse   . OSA (obstructive sleep apnea)    on CPAP-Mild - Sleep study 2004  . Pulmonary embolus ([place])    2010 and 12-2013  . Shortness of breath    with exertion  . Sleep apnea    wears CPAP  . Tinnitus    Subjective  . Zoster 2014   R flank    Past Surgical History:  Procedure Laterality Date  . ABDOMINAL HYSTERECTOMY     still has 1 ovary   . APPENDECTOMY    . [place]   Stem surgery, Arnold Chiari Malformation  . BREAST REDUCTION SURGERY    . CATARACT EXTRACTION Bilateral 2017   cataracts, Dr. Herbert Deaner  . CHOLECYSTECTOMY  2008  . COLONOSCOPY    . ESOPHAGOGASTRODUODENOSCOPY    . HERNIA REPAIR     Hital Hernia  . KNEE ARTHROSCOPY  05/16/2012   Procedure: ARTHROSCOPY KNEE;  Surgeon: Sharmon Revere, MD;   Location: [place];  Service: Orthopedics;  Laterality: Left;  . NISSEN FUNDOPLICATION     X 3 lab, open x 2 last with mesh  . REDUCTION MAMMAPLASTY Bilateral 2007    There were no vitals filed for this visit.      Subjective Assessment - 11/22/16 1410    Subjective "knot" in the back of her leg is hurting today   Pertinent History L knee arthroscopy 04/2012 (meniscal injury per pt report); h/o mutiple DVT's in L LE with PE   Diagnostic tests 08/24/16 LE venous doppler: No evidence of DVT within the left lower extremity.   Patient Stated Goals "to find out what's going on with my legs and fix it; get my legs strong"   Currently in Pain? Yes   Pain Score 6    Pain Location Leg   Pain Orientation Left;Posterior;Lower   Pain Descriptors / Indicators Aching;Discomfort                         [facility] Adult PT Treatment/Exercise - 11/22/16 1411      Knee/Hip Exercises: Stretches   Gastroc Stretch Left;30 seconds;3 reps   Gastroc Stretch Limitations runners stretch  Knee/Hip Exercises: Aerobic   Recumbent Bike L2 x 6 min     Knee/Hip Exercises: Machines for Strengthening   Cybex Knee Extension 30# B LE 2 x 15   Cybex Knee Flexion 30# BLE 2 x 15     Knee/Hip Exercises: Standing   Forward Lunges Right;Left;15 reps   Forward Lunges Limitations front foot on BOSU   Functional Squat 15 reps   Functional Squat Limitations TRX + heel raise   Other Standing Knee Exercises B lateral weight shifts on BOSU x 10    Other Standing Knee Exercises mini squat on BOSU x 15                  PT Short Term Goals - 09/25/16 1328      PT SHORT TERM GOAL #1   Title Independent with initial HEP by 09/29/16   Status Achieved           PT Long Term Goals - 10/30/16 1411      PT LONG TERM GOAL #1   Title Independent with advanced HEP +/- gym program by 12/01/16   Status Partially Met     PT LONG TERM GOAL #2   Title B LE flexibility WFL to allow functional AROM at knee  and ankle by 12/01/16   Status Partially Met     PT LONG TERM GOAL #3   Title B LE strength >/= 4/5 for improved LE stability by 12/01/16   Status Partially Met     PT LONG TERM GOAL #4   Title Pt will report ability to complete daily mobility, ambulation and household chores w/o limitation due to L LE pain by 12/01/16   Status Partially Met               Plan - 11/22/16 1625    Clinical Impression Statement patient today doing well - continued reports of posterior L lower leg pain with palpable nodule. Patient tolerating all strengthening and proprioception work today with no increase in pain. patient to follow-up with MD today regarding L lower leg pain. PT to await recommendation on continued treatment vs transition to HEP vs possible trial of DN to L lower leg after follow-up.    PT Treatment/Interventions Patient/family education;Therapeutic exercise;Neuromuscular re-education;Manual techniques;Taping;Dry needling;Electrical Stimulation;Moist Heat;Cryotherapy;Iontophoresis 34m/ml Dexamethasone;ADLs/Self Care Home Management;Balance training;Therapeutic activities;Functional mobility training;Gait training;Stair training   Consulted and Agree with Plan of Care Patient      Patient will benefit from skilled therapeutic intervention in order to improve the following deficits and impairments:  Pain, Impaired flexibility, Decreased range of motion, Decreased strength, Increased muscle spasms, Increased fascial restricitons, Difficulty walking  Visit Diagnosis: Pain in left lower leg  Other symptoms and signs involving the musculoskeletal system  Muscle weakness (generalized)     Problem List Patient Active Problem List   Diagnosis Date Noted  . Diarrhea 12/02/2015  . History of colonic polyps 12/02/2015  . Abdominal pain, epigastric 12/02/2015  . PCP NOTES >>>>>>>>>>>>>>>>>>>>>>>>> 02/13/2015  . Anxiety 08/04/2014  . Annual physical exam 04/29/2014  . Leg pain, bilateral  02/19/2014  . Fibromyalgia syndrome 02/04/2014  . Dysphagia esophageal phase - chronic after 3 fundoplications 003/47/4259 . Cough 01/08/2014  . Chest pressure 01/08/2014  . DOE (dyspnea on exertion) 12/26/2013  . RUQ pain 11/16/2013  . Diabetes ([place] 09/24/2013  . Abdominal pain, chronic, right flank -upper quadrant 07/02/2012  . Overweight(278.02) 09/30/2009  . HYPERSOMNIA, ASSOCIATED WITH SLEEP APNEA 09/28/2009  . Essential hypertension 06/24/2009  .  DIZZINESS 06/24/2009  . HEADACHE 06/24/2009  . VENOUS INSUFFICIENCY, LEGS 11/19/2008  . KNEE PAIN 06/01/2008  . Acute pulmonary embolism ([place]) 05/19/2008  . Pulmonary nodule 08/26/2007  . Stricture and stenosis of esophagus 05/22/2007  . GERD 01/31/2007  . IBS (irritable bowel syndrome) 01/31/2007  . MITRAL VALVE PROLAPSE, HX OF 01/31/2007  . APPENDECTOMY, HX OF 01/31/2007     Lanney Gins, PT, DPT 11/22/16 4:27 PM   [facility] [address]  [place], [place], [address] Phone: [phone]   Fax:  [phone]  Name: Jennifer [name] MRN: [id number] Date of Birth: August 20, 1950

## 2016-11-23 NOTE — Assessment & Plan Note (Signed)
DM: Diet control, check A1c HTN: Seems controlled on Maxide, check a BMP. Anxiety, insomnia:  Restoril for sleep and sporadically takes Xanax for flying. UDS and contract today Fatigue, some DOE: Able to go to the gym and seems to feels appropriately tired after exercise. ROS otherwise (-), no edema on exam. Recent CBC showed no anemia. History of fibromyalgia: ? related. Will check a TSH, vitamin O-V29, folic acid. Otherwise observation Left leg pain: Improving on physical therapy Left calf lump: I review my old notes from 2016, she indeed had a hematoma on the the area and even then had a induration .  Refer  to sports medicine for consideration of a soft tissue ultrasound.  MRI?Marland Kitchen Warm compresses for now. Anticoagulation:  To see hematology in few weeks RTC 4 months, CPX

## 2016-11-24 ENCOUNTER — Ambulatory Visit (INDEPENDENT_AMBULATORY_CARE_PROVIDER_SITE_OTHER): Payer: Federal, State, Local not specified - PPO | Admitting: Family Medicine

## 2016-11-24 ENCOUNTER — Encounter: Payer: Self-pay | Admitting: Internal Medicine

## 2016-11-24 ENCOUNTER — Encounter: Payer: Self-pay | Admitting: Family Medicine

## 2016-11-24 ENCOUNTER — Other Ambulatory Visit (INDEPENDENT_AMBULATORY_CARE_PROVIDER_SITE_OTHER): Payer: Federal, State, Local not specified - PPO

## 2016-11-24 DIAGNOSIS — R5383 Other fatigue: Secondary | ICD-10-CM

## 2016-11-24 DIAGNOSIS — R2242 Localized swelling, mass and lump, left lower limb: Secondary | ICD-10-CM | POA: Diagnosis not present

## 2016-11-24 DIAGNOSIS — I10 Essential (primary) hypertension: Secondary | ICD-10-CM

## 2016-11-24 DIAGNOSIS — E119 Type 2 diabetes mellitus without complications: Secondary | ICD-10-CM

## 2016-11-24 DIAGNOSIS — M79605 Pain in left leg: Secondary | ICD-10-CM | POA: Diagnosis not present

## 2016-11-24 LAB — BASIC METABOLIC PANEL
BUN: 21 mg/dL (ref 7–25)
CHLORIDE: 104 mmol/L (ref 98–110)
CO2: 20 mmol/L (ref 20–31)
Calcium: 9.2 mg/dL (ref 8.6–10.4)
Creat: 1.04 mg/dL — ABNORMAL HIGH (ref 0.50–0.99)
GLUCOSE: 110 mg/dL — AB (ref 65–99)
Potassium: 3.6 mmol/L (ref 3.5–5.3)
SODIUM: 142 mmol/L (ref 135–146)

## 2016-11-24 LAB — TSH: TSH: 2.62 mIU/L

## 2016-11-24 LAB — HEMOGLOBIN A1C: HEMOGLOBIN A1C: 6.2 % (ref 4.6–6.5)

## 2016-11-24 LAB — VITAMIN B12

## 2016-11-24 LAB — FOLATE

## 2016-11-24 NOTE — Patient Instructions (Signed)
Your ultrasound is reassuring. You do not have a blood clot, cancerous mass, calcified hematoma in this area. The architecture of the ultrasound is consistent with scar tissue. I would recommend continuing physical therapy for another 4 weeks and see how you feel then. If this is still bothering you you could see an orthopedic surgeon to have the area explored and removed. An MRI is another consideration but given this is not a cancerous mass it's unlikely to be beneficial. Follow up with me in 1 month for reevaluation.

## 2016-11-25 LAB — VITAMIN D 1,25 DIHYDROXY
Vitamin D 1, 25 (OH)2 Total: 35 pg/mL (ref 18–72)
Vitamin D2 1, 25 (OH)2: <8 pg/mL
Vitamin D3 1, 25 (OH)2: 35 pg/mL

## 2016-11-27 ENCOUNTER — Ambulatory Visit: Payer: Federal, State, Local not specified - PPO | Admitting: Physical Therapy

## 2016-11-27 DIAGNOSIS — M79662 Pain in left lower leg: Secondary | ICD-10-CM

## 2016-11-27 DIAGNOSIS — R29898 Other symptoms and signs involving the musculoskeletal system: Secondary | ICD-10-CM

## 2016-11-27 DIAGNOSIS — M6281 Muscle weakness (generalized): Secondary | ICD-10-CM

## 2016-11-27 NOTE — Therapy (Signed)
[facility] [address]  [place], [place], [address] Phone: [phone]   Fax:  [phone]  Physical Therapy Treatment  Patient Details  Name: Jennifer [name] MRN: [id number] Date of Birth: Oct 03, 1950 Referring Provider: Ames Coupe, DO  Encounter Date: 11/27/2016      PT End of Session - 11/27/16 1404    Visit Number 16   Number of Visits 24   Date for PT Re-Evaluation 12/25/16   Authorization Type Federal BCBS & Tric[employer]   PT Start Time 1404   PT Stop Time 1445   PT Time Calculation (min) 41 min   Activity Tolerance Patient tolerated treatment well   Behavior During Therapy [facility] for tasks assessed/performed      Past Medical History:  Diagnosis Date  . Anemia   . Anxiety   . Chronic chest pain   . Colon polyps    inflamed partially ulcerated hyperplastic polyp  . Diabetes ([place]) 09/24/2013  . Diverticulosis   . Dysphagia esophageal phase - chronic after 3 fundoplications 2/67/1245  . Esophageal stenosis    Stricture after fundoplication REDO  . GERD (gastroesophageal reflux disease)   . Hemorrhoids   . Hypertension   . IBS (irritable bowel syndrome)   . Mitral valve prolapse   . OSA (obstructive sleep apnea)    on CPAP-Mild - Sleep study 2004  . Pulmonary embolus ([place])    2010 and 12-2013  . Shortness of breath    with exertion  . Sleep apnea    wears CPAP  . Tinnitus    Subjective  . Zoster 2014   R flank    Past Surgical History:  Procedure Laterality Date  . ABDOMINAL HYSTERECTOMY     still has 1 ovary   . APPENDECTOMY    . [place]   Stem surgery, Arnold Chiari Malformation  . BREAST REDUCTION SURGERY    . CATARACT EXTRACTION Bilateral 2017   cataracts, Dr. Herbert Deaner  . CHOLECYSTECTOMY  2008  . COLONOSCOPY    . ESOPHAGOGASTRODUODENOSCOPY    . HERNIA REPAIR     Hital Hernia  . KNEE ARTHROSCOPY  05/16/2012   Procedure: ARTHROSCOPY KNEE;  Surgeon: Sharmon Revere, MD;   Location: [facility];  Service: Orthopedics;  Laterality: Left;  . NISSEN FUNDOPLICATION     X 3 lab, open x 2 last with mesh  . REDUCTION MAMMAPLASTY Bilateral 2007    There were no vitals filed for this visit.      Subjective Assessment - 11/27/16 1413    Subjective Pt reporting Dr. Larose Kells & Dr. Barbaraann Barthel unable to identifiy source of "knot" in her leg.   Pertinent History L knee arthroscopy 04/2012 (meniscal injury per pt report); h/o mutiple DVT's in L LE with PE   Diagnostic tests 08/24/16 LE venous doppler: No evidence of DVT within the left lower extremity.   Patient Stated Goals "to find out what's going on with my legs and fix it; get my legs strong"   Currently in Pain? Yes   Pain Score 5   when leg outstretched or pressure applied to "knot"   Pain Location Leg   Pain Orientation Left;Posterior;Lower   Pain Type Chronic pain   Pain Frequency Intermittent            [facility] PT Assessment - 11/27/16 1404      Assessment   Medical Diagnosis Pain in L LE - distal lateral thigh & anteriolateral lower  leg   Referring Provider Ames Coupe, DO   Next MD Visit 12/22/16 - Dr. Barbaraann Barthel; 03/26/17 - Dr. Larose Kells     Prior Function   Level of Independence Independent   Vocation Retired   Leisure gym 3-4x/wk for 1 hr - walking on track, primarily upper body at present                     [facility] Adult PT Treatment/Exercise - 11/27/16 1404      Knee/Hip Exercises: Stretches   Gastroc Stretch Left;30 seconds;3 reps   Company secretary     Knee/Hip Exercises: Aerobic   Recumbent Bike L2 x 6 min     Knee/Hip Exercises: Standing   Heel Raises Right;Left;20 reps   Heel Raises Limitations alternating eccentrics     Manual Therapy   Manual Therapy Soft tissue mobilization   Manual therapy comments pt prone   Soft tissue mobilization STM to L gastroc/soleus   Kinesiotex Inhibit Muscle     Kinesiotix   Inhibit Muscle  L gastroc/soleus - 30% from inferior  heel to midline proximal calf, 2 strips 30% from medial & lateral inferior heel crossing over achilles and extending to proximal med/lat gastroc muscle belly          Trigger Point Dry Needling - 11/27/16 1404    Consent Given? Yes   Muscles Treated Lower Body Gastrocnemius;Soleus   Gastrocnemius Response Twitch response elicited;Palpable increased muscle length  medial gastroc   Soleus Response Twitch response elicited;Palpable increased muscle length                PT Short Term Goals - 09/25/16 1328      PT SHORT TERM GOAL #1   Title Independent with initial HEP by 09/29/16   Status Achieved           PT Long Term Goals - 11/27/16 1445      PT LONG TERM GOAL #1   Title Independent with advanced HEP +/- gym program    Status Partially Met   Target Date 12/25/16     PT LONG TERM GOAL #2   Title B LE flexibility WFL to allow functional AROM at knee and ankle    Status Partially Met   Target Date 12/25/16     PT LONG TERM GOAL #3   Title B LE strength >/= 4/5 for improved LE stability   Status Partially Met   Target Date 12/25/16     PT LONG TERM GOAL #4   Title Pt will report ability to complete daily mobility, ambulation and household chores w/o limitation due to L LE pain    Status Partially Met   Target Date 12/25/16               Plan - 11/27/16 1444    Clinical Impression Statement Pt frustrated by MD inability to determine what the "knot" in her leg is but releaved that it is not cancerous. States MD wants her to continue with PT for 4 more weeks. Pt continues to report increased pain over palpable nodule, with pain primarily when pressure applied or knee fully extended in stretched position. Increased tension present in medial gastroc and soleus, therefore performed DN to these muscles with twitch response felt and decreased tension noted following needling. DN followed by STM and taping for gastroc to promote further muscle relaxation, as well  as stretching and strengthening exercises. Given continued point tenderness, may consider trial of ionto  patch at next visit if approved by MD.   Rehab Potential Good   Clinical Impairments Affecting Rehab Potential h/o multiple DVT's & PE's on Xarelto, fibromyalgia, h/o Arnold-Chiari malformation   PT Frequency 2x / week   PT Duration 4 weeks   PT Treatment/Interventions Patient/family education;Therapeutic exercise;Neuromuscular re-education;Manual techniques;Taping;Dry needling;Electrical Stimulation;Moist Heat;Cryotherapy;Iontophoresis 80m/ml Dexamethasone;ADLs/Self Care Home Management;Balance training;Therapeutic activities;Functional mobility training;Gait training;Stair training   PT Next Visit Plan possible trial of ionto patch (pending MD approval); manual therapy to address increased muscle tension + taping as indicated & benefit noted; LE strengthening   Consulted and Agree with Plan of Care Patient      Patient will benefit from skilled therapeutic intervention in order to improve the following deficits and impairments:  Pain, Impaired flexibility, Decreased range of motion, Decreased strength, Increased muscle spasms, Increased fascial restricitons, Difficulty walking  Visit Diagnosis: Pain in left lower leg  Other symptoms and signs involving the musculoskeletal system  Muscle weakness (generalized)     Problem List Patient Active Problem List   Diagnosis Date Noted  . Diarrhea 12/02/2015  . History of colonic polyps 12/02/2015  . Abdominal pain, epigastric 12/02/2015  . PCP NOTES >>>>>>>>>>>>>>>>>>>>>>>>> 02/13/2015  . Anxiety 08/04/2014  . Annual physical exam 04/29/2014  . Leg pain, bilateral 02/19/2014  . Fibromyalgia syndrome 02/04/2014  . Dysphagia esophageal phase - chronic after 3 fundoplications 084/72/0721 . Cough 01/08/2014  . Chest pressure 01/08/2014  . DOE (dyspnea on exertion) 12/26/2013  . RUQ pain 11/16/2013  . Diabetes ([place] 09/24/2013  .  Abdominal pain, chronic, right flank -upper quadrant 07/02/2012  . Overweight(278.02) 09/30/2009  . HYPERSOMNIA, ASSOCIATED WITH SLEEP APNEA 09/28/2009  . Essential hypertension 06/24/2009  . DIZZINESS 06/24/2009  . HEADACHE 06/24/2009  . VENOUS INSUFFICIENCY, LEGS 11/19/2008  . KNEE PAIN 06/01/2008  . [place] pulmonary [place]ism ([place] 05/19/2008  . Pulmonary nodule 08/26/2007  . Stricture and stenosis of esophagus 05/22/2007  . GERD 01/31/2007  . IBS (irritable bowel syndrome) 01/31/2007  . MITRAL VALVE PROLAPSE, HX OF 01/31/2007  . APPENDECTOMY, HX OF 01/31/2007    JPercival Spanish PT, MPT 11/27/2016, 3:18 PM  [facility] [place] [address]Phone: [phone]  Fax:  [phone] Name: RCHRYSTINA NAFFMRN: [id number] of Birth: 805/03/1951

## 2016-11-29 ENCOUNTER — Ambulatory Visit: Payer: Federal, State, Local not specified - PPO | Admitting: Physical Therapy

## 2016-11-29 DIAGNOSIS — M79605 Pain in left leg: Secondary | ICD-10-CM | POA: Insufficient documentation

## 2016-11-29 NOTE — Progress Notes (Addendum)
PCP and consultation requested by: Colon Branch, MD  Subjective:   HPI: Patient is a 66 y.o. female here for left leg mass.  Patient reports she fell about a year ago onto a solid wood bedframe. Had bleeding left lower leg and a hematoma. This improved though she's continued to have some pain below knee in left leg. Pain worse with walking, standing, with extension at the knee. Pain level 6/10 and can be sharp. Concerned about lump she has in area that is small and firm, tender to the touch. Feels like this mass is getting larger. No skin changes, numbness.  Past Medical History:  Diagnosis Date  . Anemia   . Anxiety   . Chronic chest pain   . Colon polyps    inflamed partially ulcerated hyperplastic polyp  . Diabetes ([place]) 09/24/2013  . Diverticulosis   . Dysphagia esophageal phase - chronic after 3 fundoplications 7/49/4496  . Esophageal stenosis    Stricture after fundoplication REDO  . GERD (gastroesophageal reflux disease)   . Hemorrhoids   . Hypertension   . IBS (irritable bowel syndrome)   . Mitral valve prolapse   . OSA (obstructive sleep apnea)    on CPAP-Mild - Sleep study 2004  . Pulmonary embolus ([place])    2010 and 12-2013  . Shortness of breath    with exertion  . Sleep apnea    wears CPAP  . Tinnitus    Subjective  . Zoster 2014   R flank    Current Outpatient Prescriptions on File Prior to Visit  Medication Sig Dispense Refill  . acetaminophen (TYLENOL) 500 MG tablet Take 1 tablet (500 mg total) by mouth every 6 (six) hours as needed. (Patient not taking: Reported on 11/22/2016) 30 tablet 0  . ACIPHEX 20 MG tablet TAKE ONE TABLET BY MOUTH TWICE DAILY 60 tablet 5  . albuterol (VENTOLIN HFA) 108 (90 Base) MCG/ACT inhaler Inhale 2 puffs into the lungs every 6 (six) hours as needed for wheezing or shortness of breath. 1 Inhaler 1  . ALPRAZolam (XANAX) 0.5 MG tablet Take 1 tablet (0.5 mg total) by mouth daily as needed for anxiety. 30 tablet 0  . bacitracin  ointment Apply 1 application topically 2 (two) times daily. 120 g 0  . cycloSPORINE (RESTASIS) 0.05 % ophthalmic emulsion Place 1 drop into both eyes 2 (two) times daily.     Marland Kitchen escitalopram (LEXAPRO) 10 MG tablet Take 1 tablet (10 mg total) by mouth daily. 90 tablet 1  . glucose blood test strip Check blood sugar once daily 100 each 12  . hydrocortisone (ANUSOL-HC) 25 MG suppository Place 1 suppository (25 mg total) rectally 2 (two) times daily as needed for hemorrhoids or itching (bleeding). (Patient not taking: Reported on 11/22/2016) 24 suppository 0  . ONETOUCH DELICA LANCETS 75F MISC Check blood sugar once daily 100 each 12  . rivaroxaban (XARELTO) 10 MG TABS tablet Take 1 tablet (10 mg total) by mouth daily with supper. 30 tablet 11  . temazepam (RESTORIL) 30 MG capsule Take 1 capsule (30 mg total) by mouth at bedtime as needed for sleep. 30 capsule 0  . triamterene-hydrochlorothiazide (MAXZIDE-25) 37.5-25 MG tablet Take 1 tablet by mouth daily. 90 tablet 1   Current Facility-Administered Medications on File Prior to Visit  Medication Dose Route Frequency Provider Last Rate Last Dose  . 0.9 %  sodium chloride infusion  500 mL Intravenous Continuous Gatha Mayer, MD        Past Surgical  History:  Procedure Laterality Date  . ABDOMINAL HYSTERECTOMY     still has 1 ovary   . APPENDECTOMY    . [place]   Stem surgery, Arnold Chiari Malformation  . BREAST REDUCTION SURGERY    . CATARACT EXTRACTION Bilateral 08/09/2015   cataracts, Dr. Herbert Deaner  . CHOLECYSTECTOMY  August 09, 2006  . COLONOSCOPY    . ESOPHAGOGASTRODUODENOSCOPY    . HERNIA REPAIR     Hital Hernia  . KNEE ARTHROSCOPY  05/16/2012   Procedure: ARTHROSCOPY KNEE;  Surgeon: Sharmon Revere, MD;  Location: [facility];  Service: Orthopedics;  Laterality: Left;  . NISSEN FUNDOPLICATION     X 3 lab, open x 2 last with mesh  . REDUCTION MAMMAPLASTY Bilateral 2005-08-08    Allergies  Allergen Reactions  . Morphine And Related Anaphylaxis and  Shortness Of Breath  . Promethazine Hcl Other (See Comments)    Hallucinations  . Sulfonamide Derivatives Hives    Social History   Social History  . Marital status: Married    Spouse name: N/A  . Number of children: 1  . Years of education: N/A   Occupational History  . retired form the [employer] 04-2015 [employer]   Social History Main Topics  . Smoking status: Never Smoker  . Smokeless tobacco: Never Used     Comment: never used tobacco  . Alcohol use No  . Drug use: No  . Sexual activity: Yes    Birth control/ protection: None   Other Topics Concern  . Not on file   Social History Narrative   [employer] Graduate - Technical school   Married 08-Aug-2068 for 2 years, divorced; re-married 08/08/1984 for 2.5 years, divorced; re-married 08-08-88 for 1 year, divorced; re-married '00   1 son - August 09, 2070   Sister - Died @ 27 [place]        Family History  Problem Relation Age of Onset  . Diabetes Mother   . Rheum arthritis Mother   . Hypertension Mother   . Hypertension Father   . Rheum arthritis Sister   . Hypertension Sister   . Colon cancer Maternal Grandmother   . Rheum arthritis Sister   . Rheum arthritis Sister   . Rheum arthritis Sister   . CAD Sister        MIdx age 22  . Stomach cancer Maternal Uncle   . Breast cancer Neg Hx   . Esophageal cancer Neg Hx   . Rectal cancer Neg Hx     BP 113/76   Pulse 76   Ht 5\' 9"  (1.753 m)   Wt 250 lb (113.4 kg)   BMI 36.92 kg/m   Review of Systems: See HPI above.     Objective:  Physical Exam:  Gen: NAD, comfortable in exam room  Left lower leg: No gross deformity, swelling, bruising. TTP within medial gastroc but also superficially over firm small <1cm mass in this area. FROM knee and ankle. Mild pain with calf raise and plantarflexion of ankle. Negative ant/post drawers. Negative valgus/varus testing. Negative lachmanns. Negative mcmurrays, apleys, patellar apprehension. NV intact distally.  MSK u/s left leg:  In area of  firm mass only superficial compressible vein here and connective/fatty tissue.  No neovascularity.  Gastroc deep to this appears normal without obvious tear.   Assessment & Plan:  1. Left leg pain - Ultrasound performed and reviewed - no evidence cancerous mass or superficial clot, calcified hematoma.  Consistent with scar tissue.  Encouraged to  continue physical therapy for 4 weeks.  Reassured regarding mass.  F/u in 1 month for reevaluation.  Addendum:  MRI reviewed and discussed with patient.  Confirmed scar tissue, benign.  Reassured patient.  Could consider localized cortisone injection to the area or general surgeon referral for removal but strongly recommended leaving this alone.  Continue with PT otherwise, call us if she needs [place].

## 2016-11-29 NOTE — Assessment & Plan Note (Signed)
Ultrasound performed and reviewed - no evidence cancerous mass or superficial clot, calcified hematoma.  Consistent with scar tissue.  Encouraged to continue physical therapy for 4 weeks.  Reassured regarding mass.  F/u in 1 month for reevaluation.

## 2016-11-30 NOTE — Addendum Note (Signed)
Addended byDamita Dunnings D on: 11/30/2016 03:48 PM   Modules accepted: Orders

## 2016-12-01 NOTE — Addendum Note (Signed)
Addended by: Sherrie George F on: 12/01/2016 10:10 AM   Modules accepted: Orders

## 2016-12-02 ENCOUNTER — Other Ambulatory Visit: Payer: Self-pay | Admitting: Internal Medicine

## 2016-12-02 ENCOUNTER — Ambulatory Visit (HOSPITAL_BASED_OUTPATIENT_CLINIC_OR_DEPARTMENT_OTHER): Admission: RE | Admit: 2016-12-02 | Payer: Federal, State, Local not specified - PPO | Source: Ambulatory Visit

## 2016-12-04 ENCOUNTER — Ambulatory Visit: Payer: Federal, State, Local not specified - PPO | Admitting: Physical Therapy

## 2016-12-04 DIAGNOSIS — R29898 Other symptoms and signs involving the musculoskeletal system: Secondary | ICD-10-CM

## 2016-12-04 DIAGNOSIS — M6281 Muscle weakness (generalized): Secondary | ICD-10-CM

## 2016-12-04 DIAGNOSIS — M79662 Pain in left lower leg: Secondary | ICD-10-CM

## 2016-12-04 NOTE — Telephone Encounter (Signed)
needs UDS.  Okay #30, no refills

## 2016-12-04 NOTE — Telephone Encounter (Signed)
Rx faxed to [facility] pharmacy.  

## 2016-12-04 NOTE — Telephone Encounter (Signed)
Pt is requesting refill on temazepam 30mg .  Last OV: 11/22/2016 Last Fill: 11/02/2016 #30 and 0RF UDS: 11/24/2016 Pending  [place] database printed 11/02/2016 (in media); no issues noted  Please advise.

## 2016-12-04 NOTE — Telephone Encounter (Signed)
Rx printed, awaiting MD signature.  

## 2016-12-04 NOTE — Therapy (Signed)
[facility] [address]  [place], [place], [address] Phone: [phone]   Fax:  [phone]  Physical Therapy Treatment  Patient Details  Name: Jennifer [name] MRN: [id number] Date of Birth: 11-14-1950 Referring Provider: Ames Coupe, DO  Encounter Date: 12/04/2016      PT End of Session - 12/04/16 1532    Visit Number 17   Number of Visits 24   Date for PT Re-Evaluation 12/25/16   Authorization Type Federal BCBS & Tricare   PT Start Time 1532   PT Stop Time 1617   PT Time Calculation (min) 45 min   Activity Tolerance Patient tolerated treatment well   Behavior During Therapy [facility] for tasks assessed/performed      Past Medical History:  Diagnosis Date  . Anemia   . Anxiety   . Chronic chest pain   . Colon polyps    inflamed partially ulcerated hyperplastic polyp  . Diabetes ([place]) 09/24/2013  . Diverticulosis   . Dysphagia esophageal phase - chronic after 3 fundoplications 09/01/2583  . Esophageal stenosis    Stricture after fundoplication REDO  . GERD (gastroesophageal reflux disease)   . Hemorrhoids   . Hypertension   . IBS (irritable bowel syndrome)   . Mitral valve prolapse   . OSA (obstructive sleep apnea)    on CPAP-Mild - Sleep study 2004  . Pulmonary embolus ([place])    2010 and 12-2013  . Shortness of breath    with exertion  . Sleep apnea    wears CPAP  . Tinnitus    Subjective  . Zoster 2014   R flank    Past Surgical History:  Procedure Laterality Date  . ABDOMINAL HYSTERECTOMY     still has 1 ovary   . APPENDECTOMY    . [place]   Stem surgery, Arnold Chiari Malformation  . BREAST REDUCTION SURGERY    . CATARACT EXTRACTION Bilateral 2017   cataracts, Dr. Herbert Deaner  . CHOLECYSTECTOMY  2008  . COLONOSCOPY    . ESOPHAGOGASTRODUODENOSCOPY    . HERNIA REPAIR     Hital Hernia  . KNEE ARTHROSCOPY  05/16/2012   Procedure: ARTHROSCOPY KNEE;  Surgeon: Sharmon Revere, MD;   Location: [facility];  Service: Orthopedics;  Laterality: Left;  . NISSEN FUNDOPLICATION     X 3 lab, open x 2 last with mesh  . REDUCTION MAMMAPLASTY Bilateral 2007    There were no vitals filed for this visit.      Subjective Assessment - 12/04/16 1537    Subjective Pt reporting she felt like the taping hurt this time but she left it on for 5 days. Had to reschedule her imaging from last Sat to this coming Sat due to wrong body part ordered.   Pertinent History L knee arthroscopy 04/2012 (meniscal injury per pt report); h/o mutiple DVT's in L LE with PE   Patient Stated Goals "to find out what's going on with my legs and fix it; get my legs strong"   Currently in Pain? Yes   Pain Score 3    Pain Location Leg   Pain Orientation Left;Posterior;Lower   Pain Descriptors / Indicators Aching;Discomfort   Pain Type Chronic pain   Pain Onset More than a month ago   Pain Frequency Constant   Aggravating Factors  stretching leg out straight, pressure on "knot"   Pain Relieving Factors tylenol  [facility] Adult PT Treatment/Exercise - 12/04/16 1532      Knee/Hip Exercises: Aerobic   Nustep L6 x 6 minutes     Knee/Hip Exercises: Standing   Heel Raises Right;Left;20 reps   Heel Raises Limitations alternating eccentrics, negative heel at back of UBE   Forward Lunges Right;Left;15 reps   Forward Lunges Limitations front foot on BOSU (up)   SLS L SLS on foam - 4 cone taps x 10     Modalities   Modalities Iontophoresis     Iontophoresis   Type of Iontophoresis Dexamethasone   Location L proximal medial calf (over nodule/area of tenderness)   Dose 80 mA-min, 1.0 mL   Time 4-6 hr patch (#1 of 6)                PT Education - 12/04/16 1605    Education provided Yes   Education Details Ionto patch wearing instructions, precautions & contraindications   Person(s) Educated Patient   Methods Explanation;Demonstration;Handout   Comprehension  Verbalized understanding          PT Short Term Goals - 09/25/16 1328      PT SHORT TERM GOAL #1   Title Independent with initial HEP by 09/29/16   Status Achieved           PT Long Term Goals - 11/27/16 1445      PT LONG TERM GOAL #1   Title Independent with advanced HEP +/- gym program    Status Partially Met   Target Date 12/25/16     PT LONG TERM GOAL #2   Title B LE flexibility WFL to allow functional AROM at knee and ankle    Status Partially Met   Target Date 12/25/16     PT LONG TERM GOAL #3   Title B LE strength >/= 4/5 for improved LE stability   Status Partially Met   Target Date 12/25/16     PT LONG TERM GOAL #4   Title Pt will report ability to complete daily mobility, ambulation and household chores w/o limitation due to L LE pain    Status Partially Met   Target Date 12/25/16               Plan - 12/04/16 1532    Clinical Impression Statement Pt reporting increased discomfort following DN +/- taping but left tape in place x 5 days despite this. Reminded pt that goal is not to increase pain, and we do not expect her to continue with something that causes her increased pain. Continued strengthening and propriocpetion/balance focus with exercises with good tolerance other than expected fatigue. Initiated trial of ionto patch to tender nodule on L medial upper calf and will assess response at next visit.   Rehab Potential Good   Clinical Impairments Affecting Rehab Potential h/o multiple DVT's & PE's on Xarelto, fibromyalgia, h/o Arnold-Chiari malformation   PT Frequency 2x / week   PT Duration 4 weeks   PT Treatment/Interventions Patient/family education;Therapeutic exercise;Neuromuscular re-education;Manual techniques;Taping;Dry needling;Electrical Stimulation;Moist Heat;Cryotherapy;Iontophoresis 50m/ml Dexamethasone;ADLs/Self Care Home Management;Balance training;Therapeutic activities;Functional mobility training;Gait training;Stair training   PT  Next Visit Plan assess response to ionto patch; manual therapy to address increased muscle tension + taping as indicated & benefit noted; LE strengthening   Consulted and Agree with Plan of Care Patient      Patient will benefit from skilled therapeutic intervention in order to improve the following deficits and impairments:  Pain, Impaired flexibility, Decreased range of motion, Decreased strength, Increased  muscle spasms, Increased fascial restricitons, Difficulty walking  Visit Diagnosis: Pain in left lower leg  Other symptoms and signs involving the musculoskeletal system  Muscle weakness (generalized)     Problem List Patient Active Problem List   Diagnosis Date Noted  . Left leg pain 11/29/2016  . Diarrhea 12/02/2015  . History of colonic polyps 12/02/2015  . Abdominal pain, epigastric 12/02/2015  . PCP NOTES >>>>>>>>>>>>>>>>>>>>>>>>> 02/13/2015  . Anxiety 08/04/2014  . Annual physical exam 04/29/2014  . Leg pain, bilateral 02/19/2014  . Fibromyalgia syndrome 02/04/2014  . Dysphagia esophageal phase - chronic after 3 fundoplications 76/72/0947  . Cough 01/08/2014  . Chest pressure 01/08/2014  . DOE (dyspnea on exertion) 12/26/2013  . RUQ pain 11/16/2013  . Diabetes ([place]) 09/24/2013  . Abdominal pain, chronic, right flank -upper quadrant 07/02/2012  . Overweight(278.02) 09/30/2009  . HYPERSOMNIA, ASSOCIATED WITH SLEEP APNEA 09/28/2009  . Essential hypertension 06/24/2009  . DIZZINESS 06/24/2009  . HEADACHE 06/24/2009  . VENOUS INSUFFICIENCY, LEGS 11/19/2008  . KNEE PAIN 06/01/2008  . Acute pulmonary embolism ([place]) 05/19/2008  . Pulmonary nodule 08/26/2007  . Stricture and stenosis of esophagus 05/22/2007  . GERD 01/31/2007  . IBS (irritable bowel syndrome) 01/31/2007  . MITRAL VALVE PROLAPSE, HX OF 01/31/2007  . APPENDECTOMY, HX OF 01/31/2007    Percival Spanish, PT, MPT 12/04/2016, 6:25 PM  [facility] [address]  [place], [place], [address] Phone: [phone]   Fax:  [phone]  Name: SHAMBHAVI SALLEY MRN: [id number] Date of Birth: [birth year]

## 2016-12-04 NOTE — Patient Instructions (Signed)

## 2016-12-06 ENCOUNTER — Ambulatory Visit: Payer: Federal, State, Local not specified - PPO | Admitting: Physical Therapy

## 2016-12-06 ENCOUNTER — Telehealth: Payer: Self-pay

## 2016-12-06 DIAGNOSIS — M79662 Pain in left lower leg: Secondary | ICD-10-CM

## 2016-12-06 DIAGNOSIS — M6281 Muscle weakness (generalized): Secondary | ICD-10-CM

## 2016-12-06 DIAGNOSIS — R29898 Other symptoms and signs involving the musculoskeletal system: Secondary | ICD-10-CM

## 2016-12-06 NOTE — Therapy (Signed)
[facility] [place] [address]  [place], [place], [address] Phone: [phone]   Fax:  [phone]  Physical Therapy Treatment  Patient Details  Name: Jennifer [name] MRN: [id number] Date of Birth: 11-20-1950 Referring Provider: Ames Coupe, DO  Encounter Date: 12/06/2016      PT End of Session - 12/06/16 1607    Visit Number 18   Number of Visits 24   Date for PT Re-Evaluation 12/25/16   Authorization Type Federal [employer] & [employer]   PT Start Time 1605   PT Stop Time 1647   PT Time Calculation (min) 42 min   Activity Tolerance Patient tolerated treatment well   Behavior During Therapy [facility] for tasks assessed/performed      Past Medical History:  Diagnosis Date  . Anemia   . Anxiety   . Chronic chest pain   . Colon polyps    inflamed partially ulcerated hyperplastic polyp  . Diabetes ([place]) 09/24/2013  . Diverticulosis   . Dysphagia esophageal phase - chronic after 3 fundoplications 05/16/4823  . Esophageal stenosis    Stricture after fundoplication REDO  . GERD (gastroesophageal reflux disease)   . Hemorrhoids   . Hypertension   . IBS (irritable bowel syndrome)   . Mitral valve prolapse   . OSA (obstructive sleep apnea)    on CPAP-Mild - Sleep study 2004  . Pulmonary embolus ([place])    2010 and 12-2013  . Shortness of breath    with exertion  . Sleep apnea    wears CPAP  . Tinnitus    Subjective  . Zoster 2014   R flank    Past Surgical History:  Procedure Laterality Date  . ABDOMINAL HYSTERECTOMY     still has 1 ovary   . APPENDECTOMY    . [place]   Stem surgery, Arnold Chiari Malformation  . BREAST REDUCTION SURGERY    . CATARACT EXTRACTION Bilateral 2017   cataracts, Dr. Herbert Deaner  . CHOLECYSTECTOMY  2008  . COLONOSCOPY    . ESOPHAGOGASTRODUODENOSCOPY    . HERNIA REPAIR     Hital Hernia  . KNEE ARTHROSCOPY  05/16/2012   Procedure: ARTHROSCOPY KNEE;  Surgeon: Sharmon Revere, MD;   Location: [facility];  Service: Orthopedics;  Laterality: Left;  . NISSEN FUNDOPLICATION     X 3 lab, open x 2 last with mesh  . REDUCTION MAMMAPLASTY Bilateral 2007    There were no vitals filed for this visit.      Subjective Assessment - 12/06/16 1606    Subjective Having MRI on Saturday; took tylenol   Pertinent History L knee arthroscopy 04/2012 (meniscal injury per pt report); h/o mutiple DVT's in L LE with PE   Diagnostic tests 08/24/16 LE venous doppler: No evidence of DVT within the left lower extremity.   Patient Stated Goals "to find out what's going on with my legs and fix it; get my legs strong"   Currently in Pain? No/denies   Pain Score 0-No pain                         [facility] Adult PT Treatment/Exercise - 12/06/16 1608      Knee/Hip Exercises: Stretches   Gastroc Stretch Left;30 seconds;3 reps   Gastroc Stretch Limitations Prostretch     Knee/Hip Exercises: Aerobic   Nustep L6 x 6 minutes     Knee/Hip Exercises: Standing   Heel Raises Right;Left;20  reps   Heel Raises Limitations alternating eccentrics, negative heel at back of UBE   Wall Squat 15 reps   Wall Squat Limitations with orange ball against wall   Other Standing Knee Exercises side stepping - each direction 1 lap around gym - green tband   Other Standing Knee Exercises split squat - B LE x 15 reps - foot posterior on 9" stool     Knee/Hip Exercises: Seated   Sit to Sand 15 reps  slow eccentric lowering - blue medball                  PT Short Term Goals - 09/25/16 1328      PT SHORT TERM GOAL #1   Title Independent with initial HEP by 09/29/16   Status Achieved           PT Long Term Goals - 11/27/16 1445      PT LONG TERM GOAL #1   Title Independent with advanced HEP +/- gym program    Status Partially Met   Target Date 12/25/16     PT LONG TERM GOAL #2   Title B LE flexibility WFL to allow functional AROM at knee and ankle    Status Partially Met   Target Date  12/25/16     PT LONG TERM GOAL #3   Title B LE strength >/= 4/5 for improved LE stability   Status Partially Met   Target Date 12/25/16     PT LONG TERM GOAL #4   Title Pt will report ability to complete daily mobility, ambulation and household chores w/o limitation due to L LE pain    Status Partially Met   Target Date 12/25/16               Plan - 12/06/16 1607    Clinical Impression Statement Patient reporitng slight nauseousness with ionto patch last visit - thus not reapplied today. Good strengthening progression with all weight bearing activities with no increase in pain. Is planning to have MRI on Saturday at L calf. WIll continue to progress towards goals.    PT Treatment/Interventions Patient/family education;Therapeutic exercise;Neuromuscular re-education;Manual techniques;Taping;Dry needling;Electrical Stimulation;Moist Heat;Cryotherapy;Iontophoresis 75m/ml Dexamethasone;ADLs/Self Care Home Management;Balance training;Therapeutic activities;Functional mobility training;Gait training;Stair training   PT Next Visit Plan assess response to ionto patch; manual therapy to address increased muscle tension + taping as indicated & benefit noted; LE strengthening   Consulted and Agree with Plan of Care Patient      Patient will benefit from skilled therapeutic intervention in order to improve the following deficits and impairments:  Pain, Impaired flexibility, Decreased range of motion, Decreased strength, Increased muscle spasms, Increased fascial restricitons, Difficulty walking  Visit Diagnosis: Pain in left lower leg  Other symptoms and signs involving the musculoskeletal system  Muscle weakness (generalized)     Problem List Patient Active Problem List   Diagnosis Date Noted  . Left leg pain 11/29/2016  . Diarrhea 12/02/2015  . History of colonic polyps 12/02/2015  . Abdominal pain, epigastric 12/02/2015  . PCP NOTES >>>>>>>>>>>>>>>>>>>>>>>>> 02/13/2015  .  Anxiety 08/04/2014  . Annual physical exam 04/29/2014  . Leg pain, bilateral 02/19/2014  . Fibromyalgia syndrome 02/04/2014  . Dysphagia esophageal phase - chronic after 3 fundoplications 067/34/1937 . Cough 01/08/2014  . Chest pressure 01/08/2014  . DOE (dyspnea on exertion) 12/26/2013  . RUQ pain 11/16/2013  . Diabetes ([place] 09/24/2013  . Abdominal pain, chronic, right flank -upper quadrant 07/02/2012  . Overweight(278.02) 09/30/2009  .  HYPERSOMNIA, ASSOCIATED WITH SLEEP APNEA 09/28/2009  . Essential hypertension 06/24/2009  . DIZZINESS 06/24/2009  . HEADACHE 06/24/2009  . VENOUS INSUFFICIENCY, LEGS 11/19/2008  . KNEE PAIN 06/01/2008  . Acute pulmonary embolism ([place]) 05/19/2008  . Pulmonary nodule 08/26/2007  . Stricture and stenosis of esophagus 05/22/2007  . GERD 01/31/2007  . IBS (irritable bowel syndrome) 01/31/2007  . MITRAL VALVE PROLAPSE, HX OF 01/31/2007  . APPENDECTOMY, HX OF 01/31/2007    Lanney Gins, PT, DPT 12/06/16 4:48 PM   [facility] [address]  [place], [place], [address] Phone: [phone]   Fax:  [phone]  Name: Jennifer [name] MRN: [id number] Date of Birth: 1950/09/05

## 2016-12-06 NOTE — Telephone Encounter (Signed)
UDS: 11/24/2016  Alprazolam: not detected: uses rarely Temazepam: detected   Low risk per PCP 12/06/2016

## 2016-12-09 ENCOUNTER — Ambulatory Visit (HOSPITAL_BASED_OUTPATIENT_CLINIC_OR_DEPARTMENT_OTHER)
Admission: RE | Admit: 2016-12-09 | Discharge: 2016-12-09 | Disposition: A | Discharge status: Home / Self Care | Payer: Federal, State, Local not specified - PPO | Source: Ambulatory Visit | Attending: Family Medicine | Admitting: Family Medicine

## 2016-12-09 DIAGNOSIS — R937 Abnormal findings on diagnostic imaging of other parts of musculoskeletal system: Secondary | ICD-10-CM | POA: Diagnosis not present

## 2016-12-09 DIAGNOSIS — M79605 Pain in left leg: Secondary | ICD-10-CM | POA: Insufficient documentation

## 2016-12-09 MED ORDER — GADOBENATE DIMEGLUMINE 529 MG/ML IV SOLN
15.0000 mL | Freq: Once | INTRAVENOUS | Status: AC | PRN
  Administered 2016-12-09: 15 mL via INTRAVENOUS

## 2016-12-11 ENCOUNTER — Ambulatory Visit: Payer: Federal, State, Local not specified - PPO | Admitting: Physical Therapy

## 2016-12-11 DIAGNOSIS — M79662 Pain in left lower leg: Secondary | ICD-10-CM

## 2016-12-11 DIAGNOSIS — M6281 Muscle weakness (generalized): Secondary | ICD-10-CM

## 2016-12-11 DIAGNOSIS — R29898 Other symptoms and signs involving the musculoskeletal system: Secondary | ICD-10-CM

## 2016-12-11 NOTE — Therapy (Signed)
[facility] [address]  [place], [place], [address] Phone: [phone]   Fax:  [phone]  Physical Therapy Treatment  Patient Details  Name: Jennifer [name] MRN: [id number] Date of Birth: 06-12-1950 Referring Provider: Ames Coupe, DO  Encounter Date: 12/11/2016      PT End of Session - 12/11/16 1446    Visit Number 19   Number of Visits 24   Date for PT Re-Evaluation 12/25/16   Authorization Type Federal [employer] & [employer]   PT Start Time 1446   PT Stop Time 1530   PT Time Calculation (min) 44 min   Activity Tolerance Patient tolerated treatment well   Behavior During Therapy [facility] for tasks assessed/performed      Past Medical History:  Diagnosis Date  . Anemia   . Anxiety   . Chronic chest pain   . Colon polyps    inflamed partially ulcerated hyperplastic polyp  . Diabetes ([place]) 09/24/2013  . Diverticulosis   . Dysphagia esophageal phase - chronic after 3 fundoplications 0/35/4656  . Esophageal stenosis    Stricture after fundoplication REDO  . GERD (gastroesophageal reflux disease)   . Hemorrhoids   . Hypertension   . IBS (irritable bowel syndrome)   . Mitral valve prolapse   . OSA (obstructive sleep apnea)    on CPAP-Mild - Sleep study 2004  . Pulmonary embolus ([place])    2010 and 12-2013  . Shortness of breath    with exertion  . Sleep apnea    wears CPAP  . Tinnitus    Subjective  . Zoster 2014   R flank    Past Surgical History:  Procedure Laterality Date  . ABDOMINAL HYSTERECTOMY     still has 1 ovary   . APPENDECTOMY    . [place]   Stem surgery, Arnold Chiari Malformation  . BREAST REDUCTION SURGERY    . CATARACT EXTRACTION Bilateral 2017   cataracts, Dr. Herbert Deaner  . CHOLECYSTECTOMY  2008  . COLONOSCOPY    . ESOPHAGOGASTRODUODENOSCOPY    . HERNIA REPAIR     Hital Hernia  . KNEE ARTHROSCOPY  05/16/2012   Procedure: ARTHROSCOPY KNEE;  Surgeon: Sharmon Revere, MD;   Location: [facility];  Service: Orthopedics;  Laterality: Left;  . NISSEN FUNDOPLICATION     X 3 lab, open x 2 last with mesh  . REDUCTION MAMMAPLASTY Bilateral 2007    There were no vitals filed for this visit.      Subjective Assessment - 12/11/16 1450    Subjective Pt reports MD caled her with MRI results this morning - just scar tissue.   Pertinent History L knee arthroscopy 04/2012 (meniscal injury per pt report); h/o mutiple DVT's in L LE with PE   Diagnostic tests 12/09/16 - L LE MRI: No well defined soft tissue mass is identified at site of clinical palpable abnormality. Irregular area of T1 intermediate signal and T2 hyperintensity in the subcutaneous fat of the proximal left posteromedial left lower leg with enhancement on post-contrast imaging. This likely reflects an area of fat necrosis versus contusion and fibrosis. 08/24/16 - LE venous doppler: No evidence of DVT within the left lower extremity.   Patient Stated Goals "to find out what's going on with my legs and fix it; get my legs strong"   Currently in Pain? No/denies   Pain Score 0-No pain  [facility] Adult PT Treatment/Exercise - 12/11/16 1446      Knee/Hip Exercises: Aerobic   Nustep L7 x 6 minutes     Knee/Hip Exercises: Machines for Strengthening   Cybex Knee Extension 35# B LE x15   Cybex Knee Flexion 35# B LE x15   Cybex Leg Press 25# B LE x15     Knee/Hip Exercises: Standing   Heel Raises Right;Left;20 reps   Heel Raises Limitations alternating eccentrics, negative heel at back of UBE   Step Down Right;Left;10 reps;Hand Hold: 1;Step Height: 4"   Step Down Limitations eccentric lowering with heel touch   Other Standing Knee Exercises walking lunge - supported 2 x 10ft; unsupported x 30ft     Ankle Exercises: Machines for Strengthening   Cybex Leg Press ankle press 20# x15     Ankle Exercises: Stretches   Gastroc Stretch 30 seconds;3 reps   Gastroc Stretch Limitations L:  prostretch                  PT Short Term Goals - 09/25/16 1328      PT SHORT TERM GOAL #1   Title Independent with initial HEP by 09/29/16   Status Achieved           PT Long Term Goals - 11/27/16 1445      PT LONG TERM GOAL #1   Title Independent with advanced HEP +/- gym program    Status Partially Met   Target Date 12/25/16     PT LONG TERM GOAL #2   Title B LE flexibility WFL to allow functional AROM at knee and ankle    Status Partially Met   Target Date 12/25/16     PT LONG TERM GOAL #3   Title B LE strength >/= 4/5 for improved LE stability   Status Partially Met   Target Date 12/25/16     PT LONG TERM GOAL #4   Title Pt will report ability to complete daily mobility, ambulation and household chores w/o limitation due to L LE pain    Status Partially Met   Target Date 12/25/16               Plan - 12/11/16 1455    Clinical Impression Statement Pt relieved by negative MRI showing nothing more than scar tissue and does not plan to have interventions done to address this. Given pt already active with a trainer at the gym and therapy no longer addressing any treatment interventions other than strengthening at this point, discussed possibility of working towards transition to HEP/gym program over next few visits with pt in agreement.   Rehab Potential Good   Clinical Impairments Affecting Rehab Potential h/o multiple DVT's & PE's on Xarelto, fibromyalgia, h/o Arnold-Chiari malformation   PT Treatment/Interventions Patient/family education;Therapeutic exercise;Neuromuscular re-education;Manual techniques;Taping;Dry needling;Electrical Stimulation;Moist Heat;Cryotherapy;Iontophoresis 4mg/ml Dexamethasone;ADLs/Self Care Home Management;Balance training;Therapeutic activities;Functional mobility training;Gait training;Stair training   PT Next Visit Plan manual therapy to address increased muscle tension + taping as indicated & benefit noted; LE strengthening    Consulted and Agree with Plan of Care Patient      Patient will benefit from skilled therapeutic intervention in order to improve the following deficits and impairments:  Pain, Impaired flexibility, Decreased range of motion, Decreased strength, Increased muscle spasms, Increased fascial restricitons, Difficulty walking  Visit Diagnosis: Pain in left lower leg  Other symptoms and signs involving the musculoskeletal system  Muscle weakness (generalized)     Problem List Patient Active Problem List     Diagnosis Date Noted  . Left leg pain 11/29/2016  . Diarrhea 12/02/2015  . History of colonic polyps 12/02/2015  . Abdominal pain, epigastric 12/02/2015  . PCP NOTES >>>>>>>>>>>>>>>>>>>>>>>>> 02/13/2015  . Anxiety 08/04/2014  . Annual physical exam 04/29/2014  . Leg pain, bilateral 02/19/2014  . Fibromyalgia syndrome 02/04/2014  . Dysphagia esophageal phase - chronic after 3 fundoplications 01/20/2014  . Cough 01/08/2014  . Chest pressure 01/08/2014  . DOE (dyspnea on exertion) 12/26/2013  . RUQ pain 11/16/2013  . Diabetes ([facility]) 09/24/2013  . Abdominal pain, chronic, right flank -upper quadrant 07/02/2012  . Overweight(278.02) 09/30/2009  . HYPERSOMNIA, ASSOCIATED WITH SLEEP APNEA 09/28/2009  . Essential hypertension 06/24/2009  . DIZZINESS 06/24/2009  . HEADACHE 06/24/2009  . VENOUS INSUFFICIENCY, LEGS 11/19/2008  . KNEE PAIN 06/01/2008  . Acute pulmonary embolism ([facility]) 05/19/2008  . Pulmonary nodule 08/26/2007  . Stricture and stenosis of esophagus 05/22/2007  . GERD 01/31/2007  . IBS (irritable bowel syndrome) 01/31/2007  . MITRAL VALVE PROLAPSE, HX OF 01/31/2007  . APPENDECTOMY, HX OF 01/31/2007    JoAnne M Kreis, PT, MPT 12/11/2016, 4:05 PM  [facility] [address]  [address] [place], [place], [address] Phone: [phone]   Fax:  [phone]  Name: Jennifer [name] MRN: [id number] Date of Birth:  07/13/1950   

## 2016-12-13 ENCOUNTER — Ambulatory Visit: Payer: Federal, State, Local not specified - PPO | Admitting: Physical Therapy

## 2016-12-13 DIAGNOSIS — M6281 Muscle weakness (generalized): Secondary | ICD-10-CM

## 2016-12-13 DIAGNOSIS — M79662 Pain in left lower leg: Secondary | ICD-10-CM | POA: Diagnosis not present

## 2016-12-13 DIAGNOSIS — R29898 Other symptoms and signs involving the musculoskeletal system: Secondary | ICD-10-CM

## 2016-12-13 NOTE — Therapy (Addendum)
[facility] [address]  [place], [place], [address] Phone: [phone]   Fax:  [phone]  Physical Therapy Treatment  Patient Details  Name: Jennifer [name] MRN: [id number] Date of Birth: [birth year] Referring Provider: Ames Coupe, DO  Encounter Date: 12/13/2016      PT End of Session - 12/13/16 1406    Visit Number 20   Number of Visits 24   Date for PT Re-Evaluation 12/25/16   Authorization Type Federal [employer] & [employer]   PT Start Time 1404   PT Stop Time 1447   PT Time Calculation (min) 43 min   Activity Tolerance Patient tolerated treatment well   Behavior During Therapy [facility] for tasks assessed/performed      Past Medical History:  Diagnosis Date  . Anemia   . Anxiety   . Chronic chest pain   . Colon polyps    inflamed partially ulcerated hyperplastic polyp  . Diabetes ([place]) 09/24/2013  . Diverticulosis   . Dysphagia esophageal phase - chronic after 3 fundoplications 12/23/5174  . Esophageal stenosis    Stricture after fundoplication REDO  . GERD (gastroesophageal reflux disease)   . Hemorrhoids   . Hypertension   . IBS (irritable bowel syndrome)   . Mitral valve prolapse   . OSA (obstructive sleep apnea)    on CPAP-Mild - Sleep study 2004  . Pulmonary embolus ([place])    2010 and 12-2013  . Shortness of breath    with exertion  . Sleep apnea    wears CPAP  . Tinnitus    Subjective  . Zoster 2014   R flank    Past Surgical History:  Procedure Laterality Date  . ABDOMINAL HYSTERECTOMY     still has 1 ovary   . APPENDECTOMY    . [place]   Stem surgery, Arnold Chiari Malformation  . BREAST REDUCTION SURGERY    . CATARACT EXTRACTION Bilateral 2017   cataracts, Dr. Herbert Deaner  . CHOLECYSTECTOMY  2008  . COLONOSCOPY    . ESOPHAGOGASTRODUODENOSCOPY    . HERNIA REPAIR     Hital Hernia  . KNEE ARTHROSCOPY  05/16/2012   Procedure: ARTHROSCOPY KNEE;  Surgeon: Sharmon Revere, MD;   Location: [facility];  Service: Orthopedics;  Laterality: Left;  . NISSEN FUNDOPLICATION     X 3 lab, open x 2 last with mesh  . REDUCTION MAMMAPLASTY Bilateral 2007    There were no vitals filed for this visit.      Subjective Assessment - 12/13/16 1406    Subjective Feeling well - no new complaints   Pertinent History L knee arthroscopy 04/2012 (meniscal injury per pt report); h/o mutiple DVT's in L LE with PE   Diagnostic tests 12/09/16 - L LE MRI: No well defined soft tissue mass is identified at site of clinical palpable abnormality. Irregular area of T1 intermediate signal and T2 hyperintensity in the subcutaneous fat of the proximal left posteromedial left lower leg with enhancement on post-contrast imaging. This likely reflects an area of fat necrosis versus contusion and fibrosis. 08/24/16 - LE venous doppler: No evidence of DVT within the left lower extremity.   Patient Stated Goals "to find out what's going on with my legs and fix it; get my legs strong"   Currently in Pain? No/denies   Pain Score 0-No pain  [facility] Adult PT Treatment/Exercise - 2016/12/27 1410      Knee/Hip Exercises: Aerobic   Recumbent Bike L3 x 6 min     Knee/Hip Exercises: Machines for Strengthening   Cybex Knee Extension 35# B LE x 20   Cybex Knee Flexion 35# B LE x 20   Cybex Leg Press 35# BLE 2 x 15   Other Machine heel raise on BATCA - 35# x 20     Knee/Hip Exercises: Standing   Forward Lunges Right;Left;15 reps   Forward Lunges Limitations front foot on BOSU (up)   Functional Squat 15 reps   Functional Squat Limitations TRX   Wall Squat 15 reps   Wall Squat Limitations with orange ball against wall                  PT Short Term Goals - 09/25/16 1328      PT SHORT TERM GOAL #1   Title Independent with initial HEP by 09/29/16   Status Achieved           PT Long Term Goals - 11/27/16 1445      PT LONG TERM GOAL #1   Title Independent with  advanced HEP +/- gym program    Status Partially Met   Target Date 12/25/16     PT LONG TERM GOAL #2   Title B LE flexibility WFL to allow functional AROM at knee and ankle    Status Partially Met   Target Date 12/25/16     PT LONG TERM GOAL #3   Title B LE strength >/= 4/5 for improved LE stability   Status Partially Met   Target Date 12/25/16     PT LONG TERM GOAL #4   Title Pt will report ability to complete daily mobility, ambulation and household chores w/o limitation due to L LE pain    Status Partially Met   Target Date 12/25/16               Plan - 12/27/2016 1453    Clinical Impression Statement Patient doing well today with all strengthening progressions. Patient and PT discussing transition to HEP with patient feeling most comfortable with completing POC as outlined and then switching to independent HEP. PT session focusing solely on strengthening rather than any manual therapy to reinforce need for continued strengthening.    Clinical Impairments Affecting Rehab Potential h/o multiple DVT's & PE's on Xarelto, fibromyalgia, h/o Arnold-Chiari malformation   PT Treatment/Interventions Patient/family education;Therapeutic exercise;Neuromuscular re-education;Manual techniques;Taping;Dry needling;Electrical Stimulation;Moist Heat;Cryotherapy;Iontophoresis 44m/ml Dexamethasone;ADLs/Self Care Home Management;Balance training;Therapeutic activities;Functional mobility training;Gait training;Stair training   PT Next Visit Plan manual therapy to address increased muscle tension + taping as indicated & benefit noted; LE strengthening   Consulted and Agree with Plan of Care Patient      Patient will benefit from skilled therapeutic intervention in order to improve the following deficits and impairments:  Pain, Impaired flexibility, Decreased range of motion, Decreased strength, Increased muscle spasms, Increased fascial restricitons, Difficulty walking  Visit Diagnosis: Pain in  left lower leg  Other symptoms and signs involving the musculoskeletal system  Muscle weakness (generalized)       G-Codes - 02018/09/051443    Functional Assessment Tool Used (Outpatient Only) Lower leg FOTO = 89% (11% limitation)   Functional Limitation Mobility: Walking and moving around   Mobility: Walking and Moving Around Current Status ((M3846 At least 1 percent but less than 20 percent impaired, limited or restricted   Mobility: Walking and Moving  Around Goal Status [phone]) At least 20 percent but less than 40 percent impaired, limited or restricted   Mobility: Walking and Moving Around Discharge Status [phone]) At least 1 percent but less than 20 percent impaired, limited or restricted        Problem List Patient Active Problem List   Diagnosis Date Noted  . Left leg pain 11/29/2016  . Diarrhea 12/02/2015  . History of colonic polyps 12/02/2015  . Abdominal pain, epigastric 12/02/2015  . PCP NOTES >>>>>>>>>>>>>>>>>>>>>>>>> 02/13/2015  . Anxiety 08/04/2014  . Annual physical exam 04/29/2014  . Leg pain, bilateral 02/19/2014  . Fibromyalgia syndrome 02/04/2014  . Dysphagia esophageal phase - chronic after 3 fundoplications 44/45/8483  . Cough 01/08/2014  . Chest pressure 01/08/2014  . DOE (dyspnea on exertion) 12/26/2013  . RUQ pain 11/16/2013  . Diabetes ([place]) 09/24/2013  . Abdominal pain, chronic, right flank -upper quadrant 07/02/2012  . Overweight(278.02) 09/30/2009  . HYPERSOMNIA, ASSOCIATED WITH SLEEP APNEA 09/28/2009  . Essential hypertension 06/24/2009  . DIZZINESS 06/24/2009  . HEADACHE 06/24/2009  . VENOUS INSUFFICIENCY, LEGS 11/19/2008  . KNEE PAIN 06/01/2008  . Acute pulmonary embolism ([place]) 05/19/2008  . Pulmonary nodule 08/26/2007  . Stricture and stenosis of esophagus 05/22/2007  . GERD 01/31/2007  . IBS (irritable bowel syndrome) 01/31/2007  . MITRAL VALVE PROLAPSE, HX OF 01/31/2007  . APPENDECTOMY, HX OF 01/31/2007     Lanney Gins, PT,  DPT 12/13/16 2:56 PM   [facility] [address]  [place], [place], [address] Phone: [phone]   Fax:  [phone]  Name: Jennifer [name] MRN: [id number] Date of Birth: May 07, 1950   PHYSICAL THERAPY DISCHARGE SUMMARY  Visits from Start of Care: 20  Current functional level related to goals / functional outcomes:   Unable to formally assess as pt failed to return for remaining visits in POC, calling to request to be discharged. Refer to above note for status as of last visit.   Remaining deficits:   As above.   Education / Equipment:   HEP  Plan: Patient agrees to discharge.  Patient goals were partially met. Patient is being discharged due to the patient's request.  ?????        Percival Spanish, PT, MPT 12/21/16, 1:24 PM  [facility] [address]  [place], [place], [address] Phone: [phone]   Fax:  [phone]

## 2016-12-18 ENCOUNTER — Ambulatory Visit: Payer: Federal, State, Local not specified - PPO | Admitting: Physical Therapy

## 2016-12-20 ENCOUNTER — Ambulatory Visit: Payer: Federal, State, Local not specified - PPO | Admitting: Physical Therapy

## 2016-12-22 ENCOUNTER — Ambulatory Visit: Payer: Federal, State, Local not specified - PPO | Admitting: Family Medicine

## 2016-12-26 ENCOUNTER — Ambulatory Visit (INDEPENDENT_AMBULATORY_CARE_PROVIDER_SITE_OTHER): Payer: Federal, State, Local not specified - PPO | Admitting: Family Medicine

## 2016-12-26 ENCOUNTER — Encounter: Payer: Self-pay | Admitting: Family Medicine

## 2016-12-26 DIAGNOSIS — M79605 Pain in left leg: Secondary | ICD-10-CM | POA: Diagnosis not present

## 2016-12-26 NOTE — Patient Instructions (Signed)
Do the home exercises 3-4 times a week for 6 more weeks then can discontinue. Follow up with me as needed. If this area becomes bothersome enough you would see a general surgeon to have it removed but I would recommend leaving it alone if possible.

## 2016-12-26 NOTE — Progress Notes (Signed)
PCP and consultation requested by: Jennifer Branch, MD  Subjective:   HPI: Patient is a 66 y.o. female here for left leg mass.  8/3: Patient reports she fell about a year ago onto a solid wood bedframe. Had bleeding left lower leg and a hematoma. This improved though she's continued to have some pain below knee in left leg. Pain worse with walking, standing, with extension at the knee. Pain level 6/10 and can be sharp. Concerned about lump she has in area that is small and firm, tender to the touch. Feels like this mass is getting larger. No skin changes, numbness.  9/4: Patient reports she's doing well. Only a little soreness at 3/10 level. Still has knot in left leg medially but pain has improved from this. No redness, skin changes, numbness. Doing home exercises she learned in physical therapy.  Past Medical History:  Diagnosis Date  . Anemia   . Anxiety   . Chronic chest pain   . Jennifer polyps    inflamed partially ulcerated hyperplastic polyp  . Diabetes ([place]) 09/24/2013  . Diverticulosis   . Dysphagia esophageal phase - chronic after 3 fundoplications 0/96/2836  . Esophageal stenosis    Stricture after fundoplication REDO  . GERD (gastroesophageal reflux disease)   . Hemorrhoids   . Hypertension   . IBS (irritable bowel syndrome)   . Mitral valve prolapse   . OSA (obstructive sleep apnea)    on CPAP-Mild - Sleep study 2004  . Pulmonary embolus ([place])    2010 and 12-2013  . Shortness of breath    with exertion  . Sleep apnea    wears CPAP  . Tinnitus    Subjective  . Zoster 2014   R flank    Current Outpatient Prescriptions on File Prior to Visit  Medication Sig Dispense Refill  . acetaminophen (TYLENOL) 500 MG tablet Take 1 tablet (500 mg total) by mouth every 6 (six) hours as needed. (Patient not taking: Reported on 11/22/2016) 30 tablet 0  . ACIPHEX 20 MG tablet TAKE ONE TABLET BY MOUTH TWICE DAILY 60 tablet 5  . albuterol (VENTOLIN HFA) 108 (90 Base) MCG/ACT  inhaler Inhale 2 puffs into the lungs every 6 (six) hours as needed for wheezing or shortness of breath. 1 Inhaler 1  . ALPRAZolam (XANAX) 0.5 MG tablet Take 1 tablet (0.5 mg total) by mouth daily as needed for anxiety. 30 tablet 0  . bacitracin ointment Apply 1 application topically 2 (two) times daily. 120 g 0  . cycloSPORINE (RESTASIS) 0.05 % ophthalmic emulsion Place 1 drop into both eyes 2 (two) times daily.     Marland Kitchen escitalopram (LEXAPRO) 10 MG tablet Take 1 tablet (10 mg total) by mouth daily. 90 tablet 1  . glucose blood test strip Check blood sugar once daily 100 each 12  . hydrocortisone (ANUSOL-HC) 25 MG suppository Place 1 suppository (25 mg total) rectally 2 (two) times daily as needed for hemorrhoids or itching (bleeding). (Patient not taking: Reported on 11/22/2016) 24 suppository 0  . ONETOUCH DELICA LANCETS 62H MISC Check blood sugar once daily 100 each 12  . rivaroxaban (XARELTO) 10 MG TABS tablet Take 1 tablet (10 mg total) by mouth daily with supper. 30 tablet 11  . temazepam (RESTORIL) 30 MG capsule Take 1 capsule (30 mg total) by mouth at bedtime as needed for sleep. 30 capsule 0  . triamterene-hydrochlorothiazide (MAXZIDE-25) 37.5-25 MG tablet Take 1 tablet by mouth daily. 90 tablet 1   Current Facility-Administered Medications  on File Prior to Visit  Medication Dose Route Frequency Provider Last Rate Last Dose  . 0.9 %  sodium chloride infusion  500 mL Intravenous Continuous Gatha Mayer, MD        Past Surgical History:  Procedure Laterality Date  . ABDOMINAL HYSTERECTOMY     still has 1 ovary   . APPENDECTOMY    . [place]   Stem surgery, Arnold Chiari Malformation  . BREAST REDUCTION SURGERY    . CATARACT EXTRACTION Bilateral 08-20-2015   cataracts, Dr. Herbert Deaner  . CHOLECYSTECTOMY  August 20, 2006  . COLONOSCOPY    . ESOPHAGOGASTRODUODENOSCOPY    . HERNIA REPAIR     Hital Hernia  . KNEE ARTHROSCOPY  05/16/2012   Procedure: ARTHROSCOPY KNEE;  Surgeon: Sharmon Revere,  MD;  Location: [facility];  Service: Orthopedics;  Laterality: Left;  . NISSEN FUNDOPLICATION     X 3 lab, open x 2 last with mesh  . REDUCTION MAMMAPLASTY Bilateral 19-Aug-2005    Allergies  Allergen Reactions  . Morphine And Related Anaphylaxis and Shortness Of Breath  . Promethazine Hcl Other (See Comments)    Hallucinations  . Sulfonamide Derivatives Hives    Social History   Social History  . Marital status: Married    Spouse name: N/A  . Number of children: 1  . Years of education: N/A   Occupational History  . retired form the [employer] 04-2015 [employer]   Social History Main Topics  . Smoking status: Never Smoker  . Smokeless tobacco: Never Used     Comment: never used tobacco  . Alcohol use No  . Drug use: No  . Sexual activity: Yes    Birth control/ protection: None   Other Topics Concern  . Not on file   Social History Narrative   [employer] Graduate - Technical school   Married 19-Aug-2068 for 2 years, divorced; re-married 08/19/84 for 2.5 years, divorced; re-married 08/19/1988 for 1 year, divorced; re-married '00   1 son - 08/20/2070   Sister - Died @ 80 [place]        Family History  Problem Relation Age of Onset  . Diabetes Mother   . Rheum arthritis Mother   . Hypertension Mother   . Hypertension Father   . Rheum arthritis Sister   . Hypertension Sister   . Jennifer cancer Maternal Grandmother   . Rheum arthritis Sister   . Rheum arthritis Sister   . Rheum arthritis Sister   . CAD Sister        MIdx age 25  . Stomach cancer Maternal Uncle   . Breast cancer Neg Hx   . Esophageal cancer Neg Hx   . Rectal cancer Neg Hx     BP 106/73   Pulse 68   Ht 5\' 9"  (1.753 m)   Wt 250 lb (113.4 kg)   BMI 36.92 kg/m   Review of Systems: See HPI above.     Objective:  Physical Exam:  Gen: NAD, comfortable in exam room  Left lower leg: No gross deformity, swelling, bruising. No TTP medial gastroc.  Mild TTP over firm small <1cm mass superficially medially. FROM knee and  ankle. No pain with calf raise and plantarflexion of ankle. Negative ant/post drawers ankle.   Negative thompsons. NV intact distally.   Assessment & Plan:  1. Left leg pain - Ultrasound and MRI performed, reviewed.  Confirmed scar tissue locally but no concerning features.  Reassured patient.  Continue  home exercises for 6 more weeks then discontinue.  F/u prn.  Tylenol if needed.  If bothers her would consider general surgery referral.

## 2016-12-26 NOTE — Assessment & Plan Note (Signed)
Ultrasound and MRI performed, reviewed.  Confirmed scar tissue locally but no concerning features.  Reassured patient.  Continue home exercises for 6 more weeks then discontinue.  F/u prn.  Tylenol if needed.  If bothers her would consider general surgery referral.

## 2017-01-02 ENCOUNTER — Other Ambulatory Visit: Payer: Self-pay | Admitting: Internal Medicine

## 2017-01-02 NOTE — Telephone Encounter (Signed)
Okay to refill temazepam No. 30 tablets and 3 refills. She takes Xanax as sporadically only when she flies.

## 2017-01-02 NOTE — Telephone Encounter (Signed)
Rx faxed to [facility].

## 2017-01-02 NOTE — Telephone Encounter (Signed)
Pt is requesting refill on temazepam 30mg .  Last OV: 11/27/2016 Last Fill: 12/04/2016 #30 and 0RF UDS: 11/24/2016 Low risk  [facility] database printed 11/07/2016 (in media); Pt is on Xanax and Restoril (both prescribed by PCP); no other issues were noted.  Please advise.

## 2017-01-02 NOTE — Telephone Encounter (Signed)
Rx printed, awaiting MD signature.  

## 2017-02-01 ENCOUNTER — Ambulatory Visit: Payer: Federal, State, Local not specified - PPO | Admitting: Internal Medicine

## 2017-02-02 ENCOUNTER — Other Ambulatory Visit: Payer: Self-pay | Admitting: Internal Medicine

## 2017-02-02 ENCOUNTER — Ambulatory Visit (INDEPENDENT_AMBULATORY_CARE_PROVIDER_SITE_OTHER): Payer: Federal, State, Local not specified - PPO | Admitting: Internal Medicine

## 2017-02-02 ENCOUNTER — Encounter: Payer: Self-pay | Admitting: Internal Medicine

## 2017-02-02 ENCOUNTER — Other Ambulatory Visit: Payer: Self-pay | Admitting: Family

## 2017-02-02 VITALS — BP 126/70 | HR 70 | Temp 97.9°F | Resp 14 | Ht 69.0 in | Wt 258.0 lb

## 2017-02-02 DIAGNOSIS — R5382 Chronic fatigue, unspecified: Secondary | ICD-10-CM

## 2017-02-02 DIAGNOSIS — M797 Fibromyalgia: Secondary | ICD-10-CM

## 2017-02-02 NOTE — Progress Notes (Signed)
Subjective:    Patient ID: Jennifer [name], female    DOB: [birth year], 66 y.o.   MRN: [id number]  DOS:  02/02/2017 Type of visit - description : acute Interval history: Chief complaint is fatigue, states she feels exhausted but other than that " I feel great" This is ongoing issue. I asked about anxiety depression: Denies any symptoms, on Lexapro States  there is no stress in her life. She has a history of fibromyalgia and continue hurting but states "that won't stop me, I keep going to the gym and take Tylenol".  Review of Systems   Past Medical History:  Diagnosis Date  . Anemia   . Anxiety   . Chronic chest pain   . Colon polyps    inflamed partially ulcerated hyperplastic polyp  . Diabetes ([place]) 09/24/2013  . Diverticulosis   . Dysphagia esophageal phase - chronic after 3 fundoplications 6/57/8469  . Esophageal stenosis    Stricture after fundoplication REDO  . GERD (gastroesophageal reflux disease)   . Hemorrhoids   . Hypertension   . IBS (irritable bowel syndrome)   . Mitral valve prolapse   . OSA (obstructive sleep apnea)    on CPAP-Mild - Sleep study 2002-08-01  . Pulmonary embolus ([place])    31-Jul-2008 and 12-2013  . Shortness of breath    with exertion  . Sleep apnea    wears CPAP  . Tinnitus    Subjective  . Zoster 2012-07-31   R flank    Past Surgical History:  Procedure Laterality Date  . ABDOMINAL HYSTERECTOMY     still has 1 ovary   . APPENDECTOMY    . [place]   Stem surgery, Arnold Chiari Malformation  . BREAST REDUCTION SURGERY    . CATARACT EXTRACTION Bilateral 2015/08/01   cataracts, Dr. Herbert Deaner  . CHOLECYSTECTOMY  01-Aug-2006  . COLONOSCOPY    . ESOPHAGOGASTRODUODENOSCOPY    . HERNIA REPAIR     Hital Hernia  . KNEE ARTHROSCOPY  05/16/2012   Procedure: ARTHROSCOPY KNEE;  Surgeon: Sharmon Revere, MD;  Location: [facility];  Service: Orthopedics;  Laterality: Left;  . NISSEN FUNDOPLICATION     X 3 lab, open x 2 last with mesh  . REDUCTION MAMMAPLASTY Bilateral 07-31-05     Social History   Social History  . Marital status: Married    Spouse name: N/A  . Number of children: 1  . Years of education: N/A   Occupational History  . retired form the [employer] 04-2015 [employer]   Social History Main Topics  . Smoking status: Never Smoker  . Smokeless tobacco: Never Used     Comment: never used tobacco  . Alcohol use No  . Drug use: No  . Sexual activity: Yes    Birth control/ protection: None   Other Topics Concern  . Not on file   Social History Narrative   [employer] Graduate - Technical school   Married 2068-07-31 for 2 years, divorced; re-married July 31, 1984 for 2.5 years, divorced; re-married 07-31-1988 for 1 year, divorced; re-married '00   1 son - 08/01/70   Sister - Died @ 67 [place]          Allergies as of 02/02/2017      Reactions   Morphine And Related Anaphylaxis, Shortness Of Breath   Promethazine Hcl Other (See Comments)   Hallucinations   Sulfonamide Derivatives Hives      Medication List       Accurate  as of 02/02/17 11:59 PM. Always use your most recent med list.          acetaminophen 500 MG tablet Commonly known as:  TYLENOL Take 1 tablet (500 mg total) by mouth every 6 (six) hours as needed.   ACIPHEX 20 MG tablet Generic drug:  RABEprazole TAKE 1 TABLET BY MOUTH TWICE DAILY   albuterol 108 (90 Base) MCG/ACT inhaler Commonly known as:  VENTOLIN HFA Inhale 2 puffs into the lungs every 6 (six) hours as needed for wheezing or shortness of breath.   ALPRAZolam 0.5 MG tablet Commonly known as:  XANAX Take 1 tablet (0.5 mg total) by mouth daily as needed for anxiety.   bacitracin ointment Apply 1 application topically 2 (two) times daily.   cycloSPORINE 0.05 % ophthalmic emulsion Commonly known as:  RESTASIS Place 1 drop into both eyes 2 (two) times daily.   escitalopram 10 MG tablet Commonly known as:  LEXAPRO Take 1 tablet (10 mg total) by mouth daily.   glucose blood test strip Check blood sugar once daily     hydrocortisone 25 MG suppository Commonly known as:  ANUSOL-HC Place 1 suppository (25 mg total) rectally 2 (two) times daily as needed for hemorrhoids or itching (bleeding).   ONETOUCH DELICA LANCETS 16X Misc Check blood sugar once daily   temazepam 30 MG capsule Commonly known as:  RESTORIL Take 1 capsule (30 mg total) by mouth at bedtime as needed for sleep.   triamterene-hydrochlorothiazide 37.5-25 MG tablet Commonly known as:  MAXZIDE-25 Take 1 tablet by mouth daily.   XARELTO 10 MG Tabs tablet Generic drug:  rivaroxaban TAKE ONE TABLET BY MOUTH ONCE DAILY WITH SUPPER          Objective:   Physical Exam BP 126/70 (BP Location: Left Arm, Patient Position: Sitting, Cuff Size: Normal)   Pulse 70   Temp 97.9 F (36.6 C) (Oral)   Resp 14   Ht 5\' 9"  (1.753 m)   Wt 258 lb (117 kg)   SpO2 98%   BMI 38.10 kg/m  General:   Well developed, obese appearing. NAD.  HEENT:  Normocephalic . Face symmetric, atraumatic Neurologic:  alert & oriented X3.  Speech normal, gait appropriate for age and unassisted Psych--  Cognition and judgment appear intact.  Cooperative with normal attention span and concentration.  Behavior appropriate. No anxious or depressed appearing.      Assessment & Plan:    Assessment > DM, + neuropathy. Foot exam 04-2016 HTN Anxiety, insomnia: On restoril prn, lexapro, xanax for flying only Thyroid nodule: Bx (non neoplastic goiter) 2014,  [place] 05-2016, next 2,3 years Pulmonary: --OSA, sleep study 2004, on a CPAP --Pulmonary emboli --2010 and 12-2013 - saw hematology 12-2014, rec anticoag x 2 years (until 12-2015), then xarelto decreased to 10 mg qd x 1 year -- multiple pulmonary nodule, last CT 11-2013: No further imaging suggested Fibromyalgia CV: --MVP --  ectatic R carotid artery, [place] 05-2016, next 2 years GI: --GERD, Esophageal stenosis --chronic dysphagia s/p  fundoplication 3  -- IBS -- chronic right flank,RUQ pain : since GB surgery 2008  Etiology not clear but likely multifactorial --> Adhesions from the surgery, neuropathy (DM), postherpetic neuralgia, scarring from PE, etc.  MRI T spine done 06-2014: DJD  Zoster --  2014 right flank   PLAN Fatigue: See last OV, labs were normal. Fibromyalgia: This is a clinical diagnosis I made few years ago based on generalized pain with normal CKs and with no good alternative for  sx explanation. I had a long conversation with the patient about what fibromyalgia is and the fact that sometimes is associated with severe fatigue. She also has OSA, at first the CPAP made a significant difference in her energy levels but not now. She already has an appointment to see a pulmonologist regards OSA (although she likes to see Dr. Elsworth Soho specifically). OSA: see above. To see pulmonary soon   Today, I spent more than 18   min with the patient: >50% of the time counseling regards fibromyalgia association with fatigue, the need to see pulmonology to be sure her CPAP is working well. Also listening to her sxs and concerns

## 2017-02-02 NOTE — Progress Notes (Signed)
Pre visit review using our clinic review tool, if applicable. No additional management support is needed unless otherwise documented below in the visit note. 

## 2017-02-03 ENCOUNTER — Encounter: Payer: Self-pay | Admitting: Internal Medicine

## 2017-02-03 DIAGNOSIS — R5382 Chronic fatigue, unspecified: Secondary | ICD-10-CM

## 2017-02-03 HISTORY — DX: Chronic fatigue, unspecified: R53.82

## 2017-02-03 NOTE — Assessment & Plan Note (Signed)
Fatigue: See last OV, labs were normal. Fibromyalgia: This is a clinical diagnosis I made few years ago based on generalized pain with normal CKs and with no good alternative for sx explanation. I had a long conversation with the patient about what fibromyalgia is and the fact that sometimes is associated with severe fatigue. She also has OSA, at first the CPAP made a significant difference in her energy levels but not now. She already has an appointment to see a pulmonologist regards OSA (although she likes to see Dr. Elsworth Soho specifically). OSA: see above. To see pulmonary soon

## 2017-02-09 ENCOUNTER — Institutional Professional Consult (permissible substitution): Payer: Federal, State, Local not specified - PPO | Admitting: Pulmonary Disease

## 2017-02-16 ENCOUNTER — Other Ambulatory Visit (HOSPITAL_BASED_OUTPATIENT_CLINIC_OR_DEPARTMENT_OTHER): Payer: Federal, State, Local not specified - PPO

## 2017-02-16 ENCOUNTER — Ambulatory Visit (HOSPITAL_BASED_OUTPATIENT_CLINIC_OR_DEPARTMENT_OTHER): Payer: Federal, State, Local not specified - PPO | Admitting: Family

## 2017-02-16 VITALS — BP 119/61 | HR 66 | Temp 98.2°F | Resp 16 | Wt 258.0 lb

## 2017-02-16 DIAGNOSIS — D500 Iron deficiency anemia secondary to blood loss (chronic): Secondary | ICD-10-CM

## 2017-02-16 DIAGNOSIS — I2699 Other pulmonary embolism without acute cor pulmonale: Secondary | ICD-10-CM | POA: Diagnosis not present

## 2017-02-16 DIAGNOSIS — R0609 Other forms of dyspnea: Secondary | ICD-10-CM

## 2017-02-16 DIAGNOSIS — R5383 Other fatigue: Secondary | ICD-10-CM | POA: Diagnosis not present

## 2017-02-16 DIAGNOSIS — Z23 Encounter for immunization: Secondary | ICD-10-CM | POA: Diagnosis not present

## 2017-02-16 LAB — CBC WITH DIFFERENTIAL (CANCER CENTER ONLY)
BASO#: 0 10*3/uL (ref 0.0–0.2)
BASO%: 0.7 % (ref 0.0–2.0)
EOS%: 4.6 % (ref 0.0–7.0)
Eosinophils Absolute: 0.2 10*3/uL (ref 0.0–0.5)
HCT: 36.6 % (ref 34.8–46.6)
HGB: 12.1 g/dL (ref 11.6–15.9)
LYMPH#: 1.9 10*3/uL (ref 0.9–3.3)
LYMPH%: 42.1 % (ref 14.0–48.0)
MCH: 28.7 pg (ref 26.0–34.0)
MCHC: 33.1 g/dL (ref 32.0–36.0)
MCV: 87 fL (ref 81–101)
MONO#: 0.6 10*3/uL (ref 0.1–0.9)
MONO%: 13.2 % — ABNORMAL HIGH (ref 0.0–13.0)
NEUT#: 1.8 10*3/uL (ref 1.5–6.5)
NEUT%: 39.4 % — ABNORMAL LOW (ref 39.6–80.0)
PLATELETS: 239 10*3/uL (ref 145–400)
RBC: 4.21 10*6/uL (ref 3.70–5.32)
RDW: 15.4 % (ref 11.1–15.7)
WBC: 4.6 10*3/uL (ref 3.9–10.0)

## 2017-02-16 LAB — CMP (CANCER CENTER ONLY)
ALBUMIN: 3.6 g/dL (ref 3.3–5.5)
ALK PHOS: 57 U/L (ref 26–84)
ALT: 27 U/L (ref 10–47)
AST: 27 U/L (ref 11–38)
BILIRUBIN TOTAL: 0.5 mg/dL (ref 0.20–1.60)
BUN: 14 mg/dL (ref 7–22)
CO2: 32 mEq/L (ref 18–33)
Calcium: 8.8 mg/dL (ref 8.0–10.3)
Chloride: 105 mEq/L (ref 98–108)
Creat: 1.2 mg/dl (ref 0.6–1.2)
Glucose, Bld: 88 mg/dL (ref 73–118)
Potassium: 3.7 mEq/L (ref 3.3–4.7)
Sodium: 143 mEq/L (ref 128–145)
TOTAL PROTEIN: 7.3 g/dL (ref 6.4–8.1)

## 2017-02-16 MED ORDER — INFLUENZA VAC SPLIT QUAD 0.5 ML IM SUSY
PREFILLED_SYRINGE | INTRAMUSCULAR | Status: AC
Start: 2017-02-16 — End: 2017-02-16
  Filled 2017-02-16: qty 0.5

## 2017-02-16 MED ORDER — INFLUENZA VAC SPLIT QUAD 0.5 ML IM SUSY
0.5000 mL | PREFILLED_SYRINGE | Freq: Once | INTRAMUSCULAR | Status: AC
  Administered 2017-02-16: 0.5 mL via INTRAMUSCULAR

## 2017-02-16 NOTE — Progress Notes (Signed)
Hematology and Oncology Follow Up Visit  NICKEY CANEDO [id number] [birth year] 66 y.o. 02/16/2017   Principle Diagnosis:  Recurrent pulmonary embolism  Current Therapy:   Xarelto 10 mg po q day - maintenance for 1 year       Interim History:  Ms. Mclaurin is here today for follow-up. She is symptomatic with fatigue and SOB with over exertion. She states that she has seen blood in her stool and plans to follow-up with [name] with GI for further work up. Her last colonoscopy was last year.  Hgb is stable at 12.1 with an MCV of 87. We will see what her iron studies show.  No fever, chills, cough, rash, dizziness, SOB, chest pain, palpitations, abdominal pain or changes in bowel or bladder habits.  She has had the Nissen surgery years ago and will occasional have nausea, no vomiting.   She is resting well with Restoril. She wears her CPAP every night.  No swelling or tenderness in her extremities. The neuropathy in her hands and feet is unchanged.  She has a good appetite and is staying well hydrated. Her weight is stable.   ECOG Performance Status: 1 - Symptomatic but completely ambulatory  Medications:  Allergies as of 02/16/2017      Reactions   Morphine And Related Anaphylaxis, Shortness Of Breath   Promethazine Hcl Other (See Comments)   Hallucinations   Sulfonamide Derivatives Hives      Medication List       Accurate as of 02/16/17  3:35 PM. Always use your most recent med list.          acetaminophen 500 MG tablet Commonly known as:  TYLENOL Take 1 tablet (500 mg total) by mouth every 6 (six) hours as needed.   ACIPHEX 20 MG tablet Generic drug:  RABEprazole TAKE 1 TABLET BY MOUTH TWICE DAILY   albuterol 108 (90 Base) MCG/ACT inhaler Commonly known as:  VENTOLIN HFA Inhale 2 puffs into the lungs every 6 (six) hours as needed for wheezing or shortness of breath.   ALPRAZolam 0.5 MG tablet Commonly known as:  XANAX Take 1 tablet (0.5 mg total) by mouth daily as  needed for anxiety.   bacitracin ointment Apply 1 application topically 2 (two) times daily.   cycloSPORINE 0.05 % ophthalmic emulsion Commonly known as:  RESTASIS Place 1 drop into both eyes 2 (two) times daily.   escitalopram 10 MG tablet Commonly known as:  LEXAPRO Take 1 tablet (10 mg total) by mouth daily.   glucose blood test strip Check blood sugar once daily   hydrocortisone 25 MG suppository Commonly known as:  ANUSOL-HC Place 1 suppository (25 mg total) rectally 2 (two) times daily as needed for hemorrhoids or itching (bleeding).   ONETOUCH DELICA LANCETS 22W Misc Check blood sugar once daily   temazepam 30 MG capsule Commonly known as:  RESTORIL Take 1 capsule (30 mg total) by mouth at bedtime as needed for sleep.   triamterene-hydrochlorothiazide 37.5-25 MG tablet Commonly known as:  MAXZIDE-25 Take 1 tablet by mouth daily.   XARELTO 10 MG Tabs tablet Generic drug:  rivaroxaban TAKE ONE TABLET BY MOUTH ONCE DAILY WITH SUPPER       Allergies:  Allergies  Allergen Reactions  . Morphine And Related Anaphylaxis and Shortness Of Breath  . Promethazine Hcl Other (See Comments)    Hallucinations  . Sulfonamide Derivatives Hives    Past Medical History, Surgical history, Social history, and Family History were reviewed and updated.  Review of Systems: All other 10 point review of systems is negative.   Physical Exam:  weight is 258 lb (117 kg). Her oral temperature is 98.2 F (36.8 C). Her blood pressure is 119/61 and her pulse is 66. Her respiration is 16 and oxygen saturation is 97%.   Wt Readings from Last 3 Encounters:  02/16/17 258 lb (117 kg)  02/02/17 258 lb (117 kg)  12/26/16 250 lb (113.4 kg)    Ocular: Sclerae unicteric, pupils equal, round and reactive to light Ear-nose-throat: Oropharynx clear, dentition fair Lymphatic: No cervical, supraclavicular or axillary adenopathy Lungs no rales or rhonchi, good excursion bilaterally Heart  regular rate and rhythm, no murmur appreciated Abd soft, nontender, positive bowel sounds, no liver or spleen tip palpated on exam, no fluid wave  MSK no focal spinal tenderness, no joint edema Neuro: non-focal, well-oriented, appropriate affect Breasts: Deferred   Lab Results  Component Value Date   WBC 4.6 02/16/2017   HGB 12.1 02/16/2017   HCT 36.6 02/16/2017   MCV 87 02/16/2017   PLT 239 02/16/2017   Lab Results  Component Value Date   IRON 55 10/14/2007   Lab Results  Component Value Date   RBC 4.21 02/16/2017   No results found for: KPAFRELGTCHN, LAMBDASER, KAPLAMBRATIO No results found for: IGGSERUM, IGA, IGMSERUM No results found for: [name], SPEI   Chemistry      Component Value Date/Time   NA 143 02/16/2017 1450   NA 142 01/19/2016 1141   K 3.7 02/16/2017 1450   K 3.7 01/19/2016 1141   CL 105 02/16/2017 1450   CO2 32 02/16/2017 1450   CO2 27 01/19/2016 1141   BUN 14 02/16/2017 1450   BUN 19.5 01/19/2016 1141   CREATININE 1.2 02/16/2017 1450   CREATININE 0.9 01/19/2016 1141      Component Value Date/Time   CALCIUM 8.8 02/16/2017 1450   CALCIUM 8.8 01/19/2016 1141   ALKPHOS 57 02/16/2017 1450   ALKPHOS 59 01/19/2016 1141   AST 27 02/16/2017 1450   AST 21 01/19/2016 1141   ALT 27 02/16/2017 1450   ALT 18 01/19/2016 1141   BILITOT 0.50 02/16/2017 1450   BILITOT 0.31 01/19/2016 1141      Impression and Plan: Ms. Quimby is a very pleasant 66 yo African American female with history of recurrent pulmonary emboli. She is finishing up a year of maintenance anticoagulation with Xarelto 10 mg PO daily. Will check with [name] Monday and see if he is ok with her stopping completely.  She has done well and so far has had no new recurrent clots.  She has noticed some blood in her stool and states that she is going to follow-up with her GI [name].  She is c/o fatigue and SOB with exertion. We will  see what her iron studies show and bring her back in next week for infusion if needed.  We will plan to see her back again in another 4 months for repeat lab work and follow-up.  She will contact our office with any questions or concerns. We can certainly see her sooner if need be.   Eliezer Bottom, NP 10/26/20183:35 PM

## 2017-02-17 LAB — D-DIMER, QUANTITATIVE: D-DIMER: 0.46 mg/L FEU (ref 0.00–0.49)

## 2017-02-19 LAB — FERRITIN: Ferritin: 66 ng/ml (ref 9–269)

## 2017-02-19 LAB — IRON AND TIBC
%SAT: 17 % — AB (ref 21–57)
IRON: 49 ug/dL (ref 41–142)
TIBC: 284 ug/dL (ref 236–444)
UIBC: 235 ug/dL (ref 120–384)

## 2017-02-20 ENCOUNTER — Other Ambulatory Visit: Payer: Self-pay | Admitting: Family

## 2017-02-20 DIAGNOSIS — D509 Iron deficiency anemia, unspecified: Secondary | ICD-10-CM | POA: Insufficient documentation

## 2017-02-20 DIAGNOSIS — D500 Iron deficiency anemia secondary to blood loss (chronic): Secondary | ICD-10-CM

## 2017-02-26 ENCOUNTER — Ambulatory Visit (HOSPITAL_BASED_OUTPATIENT_CLINIC_OR_DEPARTMENT_OTHER): Payer: Federal, State, Local not specified - PPO

## 2017-02-26 VITALS — BP 117/59 | HR 68 | Temp 98.1°F | Resp 18

## 2017-02-26 DIAGNOSIS — D500 Iron deficiency anemia secondary to blood loss (chronic): Secondary | ICD-10-CM | POA: Diagnosis not present

## 2017-02-26 MED ORDER — FERUMOXYTOL INJECTION 510 MG/17 ML
510.0000 mg | Freq: Once | INTRAVENOUS | Status: AC
  Administered 2017-02-26: 510 mg via INTRAVENOUS
  Filled 2017-02-26: qty 17

## 2017-02-26 MED ORDER — SODIUM CHLORIDE 0.9 % IV SOLN
Freq: Once | INTRAVENOUS | Status: AC
  Administered 2017-02-26: 14:00:00 via INTRAVENOUS

## 2017-02-26 NOTE — Patient Instructions (Signed)

## 2017-02-26 NOTE — Progress Notes (Signed)
Patient tolerated first feraheme infusion without complications.

## 2017-03-07 ENCOUNTER — Other Ambulatory Visit: Payer: Self-pay | Admitting: Internal Medicine

## 2017-03-16 ENCOUNTER — Ambulatory Visit (INDEPENDENT_AMBULATORY_CARE_PROVIDER_SITE_OTHER): Payer: Federal, State, Local not specified - PPO | Admitting: Family Medicine

## 2017-03-16 ENCOUNTER — Encounter: Payer: Self-pay | Admitting: Family Medicine

## 2017-03-16 VITALS — BP 120/90 | Temp 99.0°F | Ht 69.0 in | Wt 257.0 lb

## 2017-03-16 DIAGNOSIS — J020 Streptococcal pharyngitis: Secondary | ICD-10-CM

## 2017-03-16 DIAGNOSIS — J40 Bronchitis, not specified as acute or chronic: Secondary | ICD-10-CM | POA: Diagnosis not present

## 2017-03-16 MED ORDER — PREDNISONE 10 MG PO TABS: ORAL_TABLET | ORAL | 0 refills | Status: DC

## 2017-03-16 MED ORDER — BENZONATATE 200 MG PO CAPS: 200.0000 mg | ORAL_CAPSULE | Freq: Two times a day (BID) | ORAL | 0 refills | Status: DC | PRN

## 2017-03-16 MED ORDER — ALBUTEROL SULFATE HFA 108 (90 BASE) MCG/ACT IN AERS: 2.0000 | INHALATION_SPRAY | Freq: Four times a day (QID) | RESPIRATORY_TRACT | 1 refills | Status: DC | PRN

## 2017-03-16 MED ORDER — AMOXICILLIN-POT CLAVULANATE 875-125 MG PO TABS: 1.0000 | ORAL_TABLET | Freq: Two times a day (BID) | ORAL | 0 refills | Status: DC

## 2017-03-16 NOTE — Patient Instructions (Signed)

## 2017-03-16 NOTE — Progress Notes (Signed)
Patient ID: Jennifer [name], female    DOB: [birth year]  Age: 66 y.o. MRN: [id number]    Subjective:  Subjective  HPI Jennifer [name] presents for sore throat and cough since Monday.  + wheeze   Review of Systems  Constitutional: Positive for chills and fever.  HENT: Positive for congestion, postnasal drip, rhinorrhea and sore throat. Negative for sinus pressure.   Respiratory: Positive for cough, chest tightness, shortness of breath and wheezing.   Cardiovascular: Negative for chest pain, palpitations and leg swelling.  Allergic/Immunologic: Negative for environmental allergies.    History Past Medical History:  Diagnosis Date  . Anemia   . Anxiety   . Chronic chest pain   . Chronic fatigue 02/03/2017  . Colon polyps    inflamed partially ulcerated hyperplastic polyp  . Diabetes ([place]) 09/24/2013  . Diverticulosis   . Dysphagia esophageal phase - chronic after 3 fundoplications 3/55/7322  . Esophageal stenosis    Stricture after fundoplication REDO  . GERD (gastroesophageal reflux disease)   . Hemorrhoids   . Hypertension   . IBS (irritable bowel syndrome)   . Mitral valve prolapse   . OSA (obstructive sleep apnea)    on CPAP-Mild - Sleep study 2004  . Pulmonary embolus ([place])    2010 and 12-2013  . Shortness of breath    with exertion  . Sleep apnea    wears CPAP  . Tinnitus    Subjective  . Zoster 2014   R flank    She has a past surgical history that includes Brain surgery (1995); Breast reduction surgery; Appendectomy; Abdominal hysterectomy; Nissen fundoplication; Cholecystectomy (2008); Hernia repair; Knee arthroscopy (05/16/2012); Colonoscopy; Esophagogastroduodenoscopy; Cataract extraction (Bilateral, 2017); and Reduction mammaplasty (Bilateral, 2007).   Her family history includes CAD in her sister; Colon cancer in her maternal grandmother; Diabetes in her mother; Hypertension in her father, mother, and sister; Rheum arthritis in her mother, sister, sister, sister, and  sister; Stomach cancer in her maternal uncle.She reports that  has never smoked. she has never used smokeless tobacco. She reports that she does not drink alcohol or use drugs.  Current Outpatient Medications on File Prior to Visit  Medication Sig Dispense Refill  . acetaminophen (TYLENOL) 500 MG tablet Take 1 tablet (500 mg total) by mouth every 6 (six) hours as needed. 30 tablet 0  . ACIPHEX 20 MG tablet TAKE 1 TABLET BY MOUTH TWICE DAILY 60 tablet 5  . ALPRAZolam (XANAX) 0.5 MG tablet Take 1 tablet (0.5 mg total) by mouth daily as needed for anxiety. 30 tablet 0  . bacitracin ointment Apply 1 application topically 2 (two) times daily. 120 g 0  . cycloSPORINE (RESTASIS) 0.05 % ophthalmic emulsion Place 1 drop into both eyes 2 (two) times daily.     Marland Kitchen escitalopram (LEXAPRO) 10 MG tablet Take 1 tablet (10 mg total) daily by mouth. 90 tablet 0  . glucose blood test strip Check blood sugar once daily 100 each 12  . hydrocortisone (ANUSOL-HC) 25 MG suppository Place 1 suppository (25 mg total) rectally 2 (two) times daily as needed for hemorrhoids or itching (bleeding). 24 suppository 0  . ONETOUCH DELICA LANCETS 02R MISC Check blood sugar once daily 100 each 12  . temazepam (RESTORIL) 30 MG capsule Take 1 capsule (30 mg total) by mouth at bedtime as needed for sleep. 30 capsule 3  . triamterene-hydrochlorothiazide (MAXZIDE-25) 37.5-25 MG tablet Take 1 tablet by mouth daily. 90 tablet 1  . XARELTO 10 MG TABS tablet TAKE  ONE TABLET BY MOUTH ONCE DAILY WITH SUPPER 30 tablet 11   Current Facility-Administered Medications on File Prior to Visit  Medication Dose Route Frequency Provider Last Rate Last Dose  . 0.9 %  sodium chloride infusion  500 mL Intravenous Continuous Gatha Mayer, MD         Objective:  Objective  Physical Exam  Constitutional: She is oriented to person, place, and time. She appears well-developed and well-nourished.  HENT:  Right Ear: External ear normal.  Left Ear:  External ear normal.  + PND + errythema  Eyes: Conjunctivae are normal. Right eye exhibits no discharge. Left eye exhibits no discharge.  Cardiovascular: Normal rate, regular rhythm and normal heart sounds.  No murmur heard. Pulmonary/Chest: Effort normal. No respiratory distress. She has wheezes. She has no rales. She exhibits no tenderness.  Musculoskeletal: She exhibits no edema.  Lymphadenopathy:    She has cervical adenopathy.  Neurological: She is alert and oriented to person, place, and time.  Nursing note and vitals reviewed.  BP 120/90   Temp 99 F (37.2 C) (Oral)   Ht 5\' 9"  (1.753 m)   Wt 257 lb (116.6 kg)   BMI 37.95 kg/m  Wt Readings from Last 3 Encounters:  03/16/17 257 lb (116.6 kg)  02/16/17 258 lb (117 kg)  02/02/17 258 lb (117 kg)     Lab Results  Component Value Date   WBC 4.6 02/16/2017   HGB 12.1 02/16/2017   HCT 36.6 02/16/2017   PLT 239 02/16/2017   GLUCOSE 88 02/16/2017   CHOL 183 11/19/2015   TRIG 103 11/19/2015   HDL 73 11/19/2015   LDLCALC 89 11/19/2015   ALT 27 02/16/2017   AST 27 02/16/2017   NA 143 02/16/2017   K 3.7 02/16/2017   CL 105 02/16/2017   CREATININE 1.2 02/16/2017   BUN 14 02/16/2017   CO2 32 02/16/2017   TSH 2.62 11/22/2016   INR 0.94 05/08/2012   HGBA1C 6.2 11/24/2016   MICROALBUR <0.7 03/08/2015   Strep  + Mr Tibia Fibula Left W Wo Contrast  Result Date: 12/10/2016 CLINICAL DATA:  Palpable mass on the posteromedial left lower leg. Mass is growing in size and painful to the touch. Patient fell 2 years ago. EXAM: MRI OF LOWER LEFT EXTREMITY WITHOUT AND WITH CONTRAST TECHNIQUE: Multiplanar, multisequence MR imaging of the left lower leg was performed both before and after administration of intravenous contrast. CONTRAST:  70mL MULTIHANCE GADOBENATE DIMEGLUMINE 529 MG/ML IV SOLN COMPARISON:  None. FINDINGS: Bones/Joint/Cartilage No marrow signal abnormality. No fracture or dislocation. Normal alignment. No joint effusion.  Small left Baker cyst. Suggestion of tricompartmental osteoarthritis of bilateral knees. Muscles and Tendons Muscles are normal.  No muscle atrophy.  No muscle edema. Soft tissue No well defined soft tissue mass is identified at site of clinical palpable abnormality. Irregular area of T1 intermediate signal and T2 hyperintensity in the subcutaneous fat of the proximal left posteromedial left lower leg with enhancement on postcontrast imaging. This likely reflects an area of fat necrosis versus contusion and fibrosis. No fluid collection or hematoma.  No other soft tissue mass. IMPRESSION: No well defined soft tissue mass is identified at site of clinical palpable abnormality. Irregular area of T1 intermediate signal and T2 hyperintensity in the subcutaneous fat of the proximal left posteromedial left lower leg with enhancement on postcontrast imaging. This likely reflects an area of fat necrosis versus contusion and fibrosis. Electronically Signed   By: Kathreen Devoid  On: 12/10/2016 09:33     Assessment & Plan:  Plan  I am having Jennifer [name] start on amoxicillin-clavulanate, benzonatate, and predniSONE. I am also having her maintain her cycloSPORINE, glucose blood, ONETOUCH DELICA LANCETS 53I, hydrocortisone, ALPRAZolam, bacitracin, acetaminophen, triamterene-hydrochlorothiazide, temazepam, ACIPHEX, XARELTO, escitalopram, and albuterol. We will continue to administer sodium chloride.  Meds ordered this encounter  Medications  . albuterol (VENTOLIN HFA) 108 (90 Base) MCG/ACT inhaler    Sig: Inhale 2 puffs into the lungs every 6 (six) hours as needed for wheezing or shortness of breath.    Dispense:  1 Inhaler    Refill:  1  . amoxicillin-clavulanate (AUGMENTIN) 875-125 MG tablet    Sig: Take 1 tablet by mouth 2 (two) times daily.    Dispense:  20 tablet    Refill:  0  . benzonatate (TESSALON) 200 MG capsule    Sig: Take 1 capsule (200 mg total) by mouth 2 (two) times daily as needed for cough.     Dispense:  20 capsule    Refill:  0  . predniSONE (DELTASONE) 10 MG tablet    Sig: TAKE 3 TABLETS PO QD FOR 3 DAYS THEN TAKE 2 TABLETS PO QD FOR 3 DAYS THEN TAKE 1 TABLET PO QD FOR 3 DAYS THEN TAKE 1/2 TAB PO QD FOR 3 DAYS    Dispense:  20 tablet    Refill:  0    Problem List Items Addressed This Visit    None    Visit Diagnoses    Bronchitis    -  Primary   Relevant Medications   albuterol (VENTOLIN HFA) 108 (90 Base) MCG/ACT inhaler   amoxicillin-clavulanate (AUGMENTIN) 875-125 MG tablet   benzonatate (TESSALON) 200 MG capsule   predniSONE (DELTASONE) 10 MG tablet   Strep throat       Relevant Medications   amoxicillin-clavulanate (AUGMENTIN) 875-125 MG tablet   predniSONE (DELTASONE) 10 MG tablet    drink plenty of liquids F/u next week with pcp if no better  Follow-up: Return if symptoms worsen or fail to improve.  Ann Held, DO

## 2017-03-20 ENCOUNTER — Institutional Professional Consult (permissible substitution): Payer: Federal, State, Local not specified - PPO | Admitting: Pulmonary Disease

## 2017-03-26 ENCOUNTER — Encounter: Payer: Self-pay | Admitting: Pulmonary Disease

## 2017-03-26 ENCOUNTER — Encounter: Payer: Federal, State, Local not specified - PPO | Admitting: Internal Medicine

## 2017-03-26 ENCOUNTER — Ambulatory Visit (INDEPENDENT_AMBULATORY_CARE_PROVIDER_SITE_OTHER): Payer: Federal, State, Local not specified - PPO | Admitting: Pulmonary Disease

## 2017-03-26 VITALS — BP 136/76 | HR 81 | Ht 69.0 in | Wt 254.2 lb

## 2017-03-26 DIAGNOSIS — G4710 Hypersomnia, unspecified: Secondary | ICD-10-CM | POA: Diagnosis not present

## 2017-03-26 DIAGNOSIS — R0609 Other forms of dyspnea: Secondary | ICD-10-CM | POA: Diagnosis not present

## 2017-03-26 DIAGNOSIS — G4730 Sleep apnea, unspecified: Secondary | ICD-10-CM

## 2017-03-26 DIAGNOSIS — R0600 Dyspnea, unspecified: Secondary | ICD-10-CM

## 2017-03-26 NOTE — Progress Notes (Signed)
Subjective:    Patient ID: Jennifer [name], female    DOB: Apr 12, 1951, 66 y.o.   MRN: [id number]  HPI   66 yo never smoker with positional obstructive sleep apnea  presents to reestablish care  She has mild intermittent cough attributed to GERD - has Nisen's x 3 -underwent recent dilation by Carlean Purl  She was diagnosed with positional OSA, worse in supine position and has been on CPAP 9 cm since 2011 with good control of events and with improvement in her daytime somnolence and fatigue.  She retired about 3 years ago from the [employer].  She continues to report tiredness, she repeatedly received an iron infusion a few months ago and this seems to have helped somewhat.  For the last 3 weeks she has been struggling with a sore throat and bronchitis, was treated with Augmentin and prednisone and feels somewhat improved but still has occasional chest tightness.  Epworth sleepiness score is 8. Husband reports mild snoring even when she is on CPAP.  Bedtime is around 10:50 PM, sleep latency is minimal, sleeps on her with one pillow, reports 1-2 nocturnal awakenings and is out of bed feeling refreshed with occasional dryness of mouth but denies headaches.  She had a PE in 12/2013 and has been maintained on Xarelto since then-followed by [name].  Her weight is unchanged at 254 pounds  Significant tests/ events reviewed  PSG 01/2003 -wt 210 lbs showed a TST of 250 mins incl 37 mins of REM, RDI was 15/h with lowest desaturation of 84% & moderate snoring, predominantly REM related events.  PSG april'11 (256 lbs )>>Mild-to-moderate obstructive sleep apnea - RDI was 17/h, non supine AHI was only 1.7/h   June , 2011- portable study (using positional device) shows AHI of 10/h with desaturation index 12/h .  psg (250 lbs) aug'11 - cpap titrated to 9 cm, small full face mask      Past Medical History:  Diagnosis Date  . Anemia   . Anxiety   . Chronic chest pain   . Chronic fatigue 02/03/2017    . Colon polyps    inflamed partially ulcerated hyperplastic polyp  . Diabetes ([place]) 09/24/2013  . Diverticulosis   . Dysphagia esophageal phase - chronic after 3 fundoplications 9/37/9024  . Esophageal stenosis    Stricture after fundoplication REDO  . GERD (gastroesophageal reflux disease)   . Hemorrhoids   . Hypertension   . IBS (irritable bowel syndrome)   . Mitral valve prolapse   . OSA (obstructive sleep apnea)    on CPAP-Mild - Sleep study 2004  . Pulmonary embolus ([place])    2010 and 12-2013  . Shortness of breath    with exertion  . Sleep apnea    wears CPAP  . Tinnitus    Subjective  . Zoster 2014   R flank     Past Surgical History:  Procedure Laterality Date  . ABDOMINAL HYSTERECTOMY     still has 1 ovary   . APPENDECTOMY    . [place]   Stem surgery, Arnold Chiari Malformation  . BREAST REDUCTION SURGERY    . CATARACT EXTRACTION Bilateral 2017   cataracts, Dr. Herbert Deaner  . CHOLECYSTECTOMY  2008  . COLONOSCOPY    . ESOPHAGOGASTRODUODENOSCOPY    . HERNIA REPAIR     Hital Hernia  . KNEE ARTHROSCOPY  05/16/2012   Procedure: ARTHROSCOPY KNEE;  Surgeon: Sharmon Revere, MD;  Location: [facility];  Service: Orthopedics;  Laterality:  Left;  . NISSEN FUNDOPLICATION     X 3 lab, open x 2 last with mesh  . REDUCTION MAMMAPLASTY Bilateral 08-18-05    Allergies  Allergen Reactions  . Morphine And Related Anaphylaxis and Shortness Of Breath  . Promethazine Hcl Other (See Comments)    Hallucinations  . Sulfonamide Derivatives Hives    Social History   Socioeconomic History  . Marital status: Married    Spouse name: Not on file  . Number of children: 1  . Years of education: Not on file  . Highest education level: Not on file  Social Needs  . Financial resource strain: Not on file  . Food insecurity - worry: Not on file  . Food insecurity - inability: Not on file  . Transportation needs - medical: Not on file  . Transportation needs - non-medical: Not on  file  Occupational History  . Occupation: retired form the [employer] 04-2015    Employer: [employer]  Tobacco Use  . Smoking status: Never Smoker  . Smokeless tobacco: Never Used  . Tobacco comment: never used tobacco  Substance and Sexual Activity  . Alcohol use: No    Alcohol/week: 0.0 oz  . Drug use: No  . Sexual activity: Yes    Birth control/protection: None  Other Topics Concern  . Not on file  Social History Narrative   [employer] Graduate - [employer] school   Married Aug 18, 2068 for 2 years, divorced; re-married 1984-08-18 for 2.5 years, divorced; re-married 08/18/1988 for 1 year, divorced; re-married '00   1 son - 08/19/2070   Sister - Died @ 63 [place]           Family History  Problem Relation Age of Onset  . Diabetes Mother   . Rheum arthritis Mother   . Hypertension Mother   . Hypertension Father   . Rheum arthritis Sister   . Hypertension Sister   . Colon cancer Maternal Grandmother   . Rheum arthritis Sister   . Rheum arthritis Sister   . Rheum arthritis Sister   . CAD Sister        MIdx age 26  . Stomach cancer Maternal Uncle   . Breast cancer Neg Hx   . Esophageal cancer Neg Hx   . Rectal cancer Neg Hx     Review of Systems Positive for chest tightness, nonproductive cough, nasal bleeding, difficulty swallowing  Constitutional: negative for anorexia, fevers and sweats  Eyes: negative for irritation, redness and visual disturbance  Ears, nose, mouth, throat, and face: negative for earaches, epistaxis, nasal congestion and sore throat  Respiratory: negative for cough, dyspnea on exertion, sputum and wheezing  Cardiovascular: negative for chest pain, dyspnea, lower extremity edema, orthopnea, palpitations and syncope  Gastrointestinal: negative for abdominal pain, constipation, diarrhea, melena, nausea and vomiting  Genitourinary:negative for dysuria, frequency and hematuria  Hematologic/lymphatic: negative for bleeding, easy bruising and lymphadenopathy    Musculoskeletal:negative for arthralgias, muscle weakness and stiff joints  Neurological: negative for coordination problems, gait problems, headaches and weakness  Endocrine: negative for diabetic symptoms including polydipsia, polyuria and weight loss     Objective:   Physical Exam  Gen. Pleasant, obese, in no distress, normal affect ENT - no lesions, no post nasal drip, class 2-3 airway Neck: No JVD, no thyromegaly, no carotid bruits Lungs: no use of accessory muscles, no dullness to percussion, decreased without rales or rhonchi  Cardiovascular: Rhythm regular, heart sounds  normal, no murmurs or gallops, no peripheral edema Abdomen: soft  and non-tender, no hepatosplenomegaly, BS normal. Musculoskeletal: No deformities, no cyanosis or clubbing Neuro:  alert, non focal, no tremors        Assessment & Plan:

## 2017-03-26 NOTE — Assessment & Plan Note (Signed)
CPAP was definitely helped improve her daytime somnolence and fatigue.  She would be eligible for a new machine since she has had a current machine since 2011. Due to snoring on current pressure of 9 cm, we will set the new machine at the pressure of 10 cm and follow-up with her download in 1 month to reassess.  She continues to have daytime fatigue and wonders if this is related to her other medical issues  Weight loss encouraged, compliance with goal of at least 4-6 hrs every night is the expectation. Advised against medications with sedative side effects Cautioned against driving when sleepy - understanding that sleepiness will vary on a day to day basis

## 2017-03-26 NOTE — Patient Instructions (Signed)
Rx for new CPAP 10 cm will be sent

## 2017-03-27 ENCOUNTER — Telehealth: Payer: Self-pay | Admitting: Pulmonary Disease

## 2017-03-27 DIAGNOSIS — G4733 Obstructive sleep apnea (adult) (pediatric): Secondary | ICD-10-CM

## 2017-03-27 NOTE — Telephone Encounter (Signed)
Order placed with template. PCC's please fax to [place].

## 2017-03-27 NOTE — Telephone Encounter (Signed)
Order sent to [facility] @ [facility]

## 2017-04-09 ENCOUNTER — Other Ambulatory Visit: Payer: Self-pay | Admitting: Internal Medicine

## 2017-05-07 ENCOUNTER — Encounter: Payer: Self-pay | Admitting: Internal Medicine

## 2017-05-07 ENCOUNTER — Ambulatory Visit (INDEPENDENT_AMBULATORY_CARE_PROVIDER_SITE_OTHER): Payer: Federal, State, Local not specified - PPO | Admitting: Internal Medicine

## 2017-05-07 ENCOUNTER — Ambulatory Visit (HOSPITAL_BASED_OUTPATIENT_CLINIC_OR_DEPARTMENT_OTHER)
Admission: RE | Admit: 2017-05-07 | Discharge: 2017-05-07 | Disposition: A | Discharge status: Home / Self Care | Payer: Federal, State, Local not specified - PPO | Source: Ambulatory Visit | Attending: Internal Medicine | Admitting: Internal Medicine

## 2017-05-07 ENCOUNTER — Other Ambulatory Visit: Payer: Self-pay | Admitting: Internal Medicine

## 2017-05-07 VITALS — BP 126/64 | HR 71 | Temp 97.8°F | Resp 14 | Ht 69.0 in | Wt 255.4 lb

## 2017-05-07 DIAGNOSIS — Z13820 Encounter for screening for osteoporosis: Secondary | ICD-10-CM | POA: Insufficient documentation

## 2017-05-07 DIAGNOSIS — H15009 Unspecified scleritis, unspecified eye: Secondary | ICD-10-CM

## 2017-05-07 DIAGNOSIS — E119 Type 2 diabetes mellitus without complications: Secondary | ICD-10-CM | POA: Diagnosis not present

## 2017-05-07 DIAGNOSIS — Z0000 Encounter for general adult medical examination without abnormal findings: Secondary | ICD-10-CM | POA: Diagnosis not present

## 2017-05-07 DIAGNOSIS — Z780 Asymptomatic menopausal state: Secondary | ICD-10-CM | POA: Diagnosis not present

## 2017-05-07 LAB — BASIC METABOLIC PANEL
BUN: 14 mg/dL (ref 6–23)
CALCIUM: 9 mg/dL (ref 8.4–10.5)
CHLORIDE: 105 meq/L (ref 96–112)
CO2: 34 meq/L — AB (ref 19–32)
Creatinine, Ser: 0.93 mg/dL (ref 0.40–1.20)
GFR: 77.96 mL/min (ref >60.00)
Glucose, Bld: 97 mg/dL (ref 70–99)
Potassium: 4 mEq/L (ref 3.5–5.1)
SODIUM: 145 meq/L (ref 135–145)

## 2017-05-07 LAB — LIPID PANEL
CHOL/HDL RATIO: 2
CHOLESTEROL: 181 mg/dL (ref 0–200)
HDL: 79.2 mg/dL (ref >39.00)
LDL CALC: 90 mg/dL (ref 0–99)
NonHDL: 101.68
Triglycerides: 59 mg/dL (ref 0.0–149.0)
VLDL: 11.8 mg/dL (ref 0.0–40.0)

## 2017-05-07 LAB — URINALYSIS, ROUTINE W REFLEX MICROSCOPIC
Bilirubin Urine: NEGATIVE
HGB URINE DIPSTICK: NEGATIVE
Ketones, ur: NEGATIVE
Leukocytes, UA: NEGATIVE
NITRITE: NEGATIVE
RBC / HPF: NONE SEEN (ref 0–?)
SPECIFIC GRAVITY, URINE: 1.02 (ref 1.000–1.030)
Total Protein, Urine: NEGATIVE
URINE GLUCOSE: NEGATIVE
Urobilinogen, UA: 0.2 (ref 0.0–1.0)
pH: 7 (ref 5.0–8.0)

## 2017-05-07 LAB — MICROALBUMIN / CREATININE URINE RATIO
CREATININE, U: 214 mg/dL
MICROALB UR: 0.8 mg/dL (ref 0.0–1.9)
Microalb Creat Ratio: 0.4 mg/g (ref 0.0–30.0)

## 2017-05-07 LAB — HEMOGLOBIN A1C: Hgb A1c MFr Bld: 6.3 % (ref 4.6–6.5)

## 2017-05-07 NOTE — Telephone Encounter (Signed)
sent 

## 2017-05-07 NOTE — Assessment & Plan Note (Addendum)
-  Td 2014;  zostavax 2014 ;  Pneumonia shot 2016;  prevnar 2017; had a flu shot ; shingrix discussed, will get when available  - Female care: last visit 2015, told to RTC 5 years ; MMG 08-2016 - [facility]: Colonoscopy 2009, cscope 11-2015, + polyp, next per GI -DEXA recommended  -labs: BMP, A1c.  Also, extensive blood work due to recent diagnosis of scleritis. -Diet and exercise discussed

## 2017-05-07 NOTE — Progress Notes (Signed)
Pre visit review using our clinic review tool, if applicable. No additional management support is needed unless otherwise documented below in the visit note. 

## 2017-05-07 NOTE — Telephone Encounter (Signed)
Pt is requesting refill on temazepam 30mg .  Last OV: 02/02/2017 Last Fill: 01/02/2017 #30 and 3RF UDS: 11/24/2016 Low risk  NCCR printed; no issues noted   Please advise.

## 2017-05-07 NOTE — Progress Notes (Signed)
Subjective:    Patient ID: Jennifer [name], female    DOB: Jun 20, 1950, 67 y.o.   MRN: [id number]  DOS:  05/07/2017 Type of visit - description : cpx Interval history: Here for a CPX.  Overall has no new concerns   Review of Systems Fatigue has actually decreased lately. Overall feels okay.  Other than above, a 14 point review of systems is negative     Past Medical History:  Diagnosis Date  . Anemia   . Anxiety   . Chronic chest pain   . Chronic fatigue 02/03/2017  . Colon polyps    inflamed partially ulcerated hyperplastic polyp  . Diabetes ([place]) 09/24/2013  . Diverticulosis   . Dysphagia esophageal phase - chronic after 3 fundoplications 11/03/4578  . Esophageal stenosis    Stricture after fundoplication REDO  . GERD (gastroesophageal reflux disease)   . Hemorrhoids   . Hypertension   . IBS (irritable bowel syndrome)   . Mitral valve prolapse   . OSA (obstructive sleep apnea)    on CPAP-Mild - Sleep study 07-09-2002  . Pulmonary embolus ([place])    07/09/2008 and 12-2013  . Shortness of breath    with exertion  . Sleep apnea    wears CPAP  . Tinnitus    Subjective  . Zoster Jul 09, 2012   R flank    Past Surgical History:  Procedure Laterality Date  . ABDOMINAL HYSTERECTOMY     still has 1 ovary   . APPENDECTOMY    . [place]   Stem surgery, Arnold Chiari Malformation  . BREAST REDUCTION SURGERY    . CATARACT EXTRACTION Bilateral 07/10/15   cataracts, Dr. Herbert Deaner  . CHOLECYSTECTOMY  2006-07-09  . COLONOSCOPY    . ESOPHAGOGASTRODUODENOSCOPY    . HERNIA REPAIR     Hital Hernia  . KNEE ARTHROSCOPY  05/16/2012   Procedure: ARTHROSCOPY KNEE;  Surgeon: Sharmon Revere, MD;  Location: [place];  Service: Orthopedics;  Laterality: Left;  . NISSEN FUNDOPLICATION     X 3 lab, open x 2 last with mesh  . REDUCTION MAMMAPLASTY Bilateral 07-09-2005    Social History   Socioeconomic History  . Marital status: Married    Spouse name: Not on file  . Number of children: 1  . Years of education:  Not on file  . Highest education level: Not on file  Social Needs  . Financial resource strain: Not on file  . Food insecurity - worry: Not on file  . Food insecurity - inability: Not on file  . Transportation needs - medical: Not on file  . Transportation needs - non-medical: Not on file  Occupational History  . Occupation: retired form the [employer] 04-2015    Employer: [employer]  Tobacco Use  . Smoking status: Never Smoker  . Smokeless tobacco: Never Used  . Tobacco comment: never used tobacco  Substance and Sexual Activity  . Alcohol use: No    Alcohol/week: 0.0 oz  . Drug use: No  . Sexual activity: Yes    Birth control/protection: None  Other Topics Concern  . Not on file  Social History Narrative   [employer] Graduate - Technical school   Married 07-09-68 for 2 years, divorced; re-married 1984/07/09 for 2.5 years, divorced; re-married Jul 09, 1988 for 1 year, divorced; re-married '00   1 son - 2070-07-09   Sister - Died @ 74 [place]       Family History  Problem Relation Age of Onset  .  Diabetes Mother   . Rheum arthritis Mother   . Hypertension Mother   . Hypertension Father   . Rheum arthritis Sister   . Hypertension Sister   . Colon cancer Maternal Grandmother   . Rheum arthritis Sister   . Rheum arthritis Sister   . Rheum arthritis Sister   . CAD Sister        MIdx age 73  . Stomach cancer Maternal Uncle   . Breast cancer Neg Hx   . Esophageal cancer Neg Hx   . Rectal cancer Neg Hx        Allergies as of 05/07/2017      Reactions   Morphine And Related Anaphylaxis, Shortness Of Breath   Promethazine Hcl Other (See Comments)   Hallucinations   Sulfonamide Derivatives Hives      Medication List        Accurate as of 05/07/17 11:59 PM. Always use your most recent med list.          acetaminophen 500 MG tablet Commonly known as:  TYLENOL Take 1 tablet (500 mg total) by mouth every 6 (six) hours as needed.   ACIPHEX 20 MG tablet Generic drug:  RABEprazole TAKE 1  TABLET BY MOUTH TWICE DAILY   albuterol 108 (90 Base) MCG/ACT inhaler Commonly known as:  VENTOLIN HFA Inhale 2 puffs into the lungs every 6 (six) hours as needed for wheezing or shortness of breath.   ALPRAZolam 0.5 MG tablet Commonly known as:  XANAX Take 1 tablet (0.5 mg total) by mouth daily as needed for anxiety.   bacitracin ointment Apply 1 application topically 2 (two) times daily.   cycloSPORINE 0.05 % ophthalmic emulsion Commonly known as:  RESTASIS Place 1 drop into both eyes 2 (two) times daily.   escitalopram 10 MG tablet Commonly known as:  LEXAPRO Take 1 tablet (10 mg total) daily by mouth.   glucose blood test strip Check blood sugar once daily   ONETOUCH DELICA LANCETS 22Q Misc Check blood sugar once daily   temazepam 30 MG capsule Commonly known as:  RESTORIL TAKE 1 CAPSULE BY MOUTH ONCE DAILY AT BEDTIME AS NEEDED FOR SLEEP   triamterene-hydrochlorothiazide 37.5-25 MG tablet Commonly known as:  MAXZIDE-25 Take 1 tablet by mouth daily.   XARELTO 10 MG Tabs tablet Generic drug:  rivaroxaban TAKE ONE TABLET BY MOUTH ONCE DAILY WITH SUPPER          Objective:   Physical Exam BP 126/64 (BP Location: Right Arm, Patient Position: Sitting, Cuff Size: Normal)   Pulse 71   Temp 97.8 F (36.6 C) (Oral)   Resp 14   Ht '5\' 9"'  (1.753 m)   Wt 255 lb 6 oz (115.8 kg)   SpO2 95%   BMI 37.71 kg/m  General:   Well developed, well nourished . NAD.  Neck: No  thyromegaly  HEENT:  Normocephalic . Face symmetric, atraumatic Lungs:  CTA B Normal respiratory effort, no intercostal retractions, no accessory muscle use. Heart: RRR,  no murmur.  No pretibial edema bilaterally  Abdomen:  Not distended, soft, non-tender. No rebound or rigidity.   Skin: Exposed areas without rash. Not pale. Not jaundice Neurologic:  alert & oriented X3.  Speech normal, gait appropriate for age and unassisted Strength symmetric and appropriate for age.  Psych: Cognition and  judgment appear intact.  Cooperative with normal attention span and concentration.  Behavior appropriate. No anxious or depressed appearing.     Assessment & Plan:   Assessment  DM, + neuropathy. Foot exam 04-2016 HTN Anxiety, insomnia: On restoril prn, lexapro, xanax for flying only Thyroid nodule: Bx (non neoplastic goiter) 2014,  [place] 05-2016, next 2,3 years Pulmonary: --OSA, sleep study 2004, on a CPAP --Pulmonary emboli --2010 and 12-2013 - saw hematology 12-2014, rec anticoag x 2 years (until 12-2015), then xarelto decreased to 10 mg qd x 1 year -- multiple pulmonary nodule, last CT 11-2013: No further imaging suggested --RAD, inhalers rarely Fibromyalgia CV: --MVP --  ectatic R carotid artery, [place] 05-2016, next 2 years GI: --GERD, Esophageal stenosis --chronic dysphagia s/p  fundoplication 3  -- IBS -- chronic right flank,RUQ pain : since GB surgery 2008 Etiology not clear but likely multifactorial --> Adhesions from the surgery, neuropathy (DM), postherpetic neuralgia, scarring from PE, etc.  MRI T spine done 06-2014: DJD  Zoster --  2014 right flank   PLAN DM : Diet controlled, checking A1c HTN: On Maxzide, BP today is very good.  Checking labs Anxiety insomnia: Well-controlled OSA: On CPAP.  Fatigue better compared to previous months. Recurrent pulmonary emboli: On Xarelto, follow-up by hematology. Scleritis: Recently dx by ophthalmology they recommend: Chest x-ray, RPR, FTA, ANCA, rheumatoid factor, ACE levels, ANA, HLA-B27, Lyme titers, urinalysis , TB test RTC 6-8 months

## 2017-05-07 NOTE — Patient Instructions (Signed)
GO TO THE LAB : Get the blood work     GO TO THE FRONT DESK Schedule your next appointment for a checkup in 6-8 months    STOP BY THE FIRST FLOOR:  get the XR

## 2017-05-08 NOTE — Assessment & Plan Note (Signed)
DM : Diet controlled, checking A1c HTN: On Maxzide, BP today is very good.  Checking labs Anxiety insomnia: Well-controlled OSA: On CPAP.  Fatigue better compared to previous months. Recurrent pulmonary emboli: On Xarelto, follow-up by hematology. Scleritis: Recently dx by ophthalmology they recommend: Chest x-ray, RPR, FTA, ANCA, rheumatoid factor, ACE levels, ANA, HLA-B27, Lyme titers, urinalysis , TB test RTC 6-8 months

## 2017-05-09 LAB — ANCA SCREEN W REFLEX TITER: ANCA SCREEN: NEGATIVE

## 2017-05-09 LAB — RHEUMATOID FACTOR

## 2017-05-09 LAB — ANGIOTENSIN CONVERTING ENZYME: ANGIOTENSIN-CONVERTING ENZYME: 56 U/L (ref 9–67)

## 2017-05-09 LAB — ANA: ANA: NEGATIVE

## 2017-05-09 LAB — QUANTIFERON-TB GOLD PLUS
Mitogen-NIL: >10 IU/mL
NIL: 0.09 [IU]/mL
QuantiFERON-TB Gold Plus: NEGATIVE
TB2-NIL: 0 IU/mL

## 2017-05-09 LAB — B. BURGDORFI ANTIBODIES

## 2017-05-09 LAB — RPR: RPR Ser Ql: NONREACTIVE

## 2017-05-09 LAB — HLA-B27 ANTIGEN: HLA-B27 Antigen: NEGATIVE

## 2017-05-18 ENCOUNTER — Other Ambulatory Visit: Payer: Self-pay | Admitting: Gastroenterology

## 2017-05-18 NOTE — Telephone Encounter (Signed)
May I refill Sir? 

## 2017-05-18 NOTE — Telephone Encounter (Signed)
Refill # 90 w/ 3 refills

## 2017-05-24 ENCOUNTER — Encounter: Payer: Self-pay | Admitting: Internal Medicine

## 2017-05-24 LAB — HM DIABETES EYE EXAM

## 2017-05-31 ENCOUNTER — Other Ambulatory Visit: Payer: Self-pay | Admitting: Internal Medicine

## 2017-06-06 ENCOUNTER — Encounter: Payer: Self-pay | Admitting: Internal Medicine

## 2017-06-14 ENCOUNTER — Other Ambulatory Visit: Payer: Self-pay | Admitting: Internal Medicine

## 2017-06-18 ENCOUNTER — Inpatient Hospital Stay: Payer: Federal, State, Local not specified - PPO | Attending: Family | Admitting: Family

## 2017-06-18 ENCOUNTER — Inpatient Hospital Stay: Payer: Federal, State, Local not specified - PPO

## 2017-06-18 ENCOUNTER — Encounter: Payer: Self-pay | Admitting: Family

## 2017-06-18 ENCOUNTER — Other Ambulatory Visit: Payer: Self-pay

## 2017-06-18 VITALS — BP 128/67 | HR 69 | Temp 97.8°F | Resp 20 | Wt 259.0 lb

## 2017-06-18 DIAGNOSIS — K921 Melena: Secondary | ICD-10-CM | POA: Diagnosis not present

## 2017-06-18 DIAGNOSIS — Z79899 Other long term (current) drug therapy: Secondary | ICD-10-CM | POA: Diagnosis not present

## 2017-06-18 DIAGNOSIS — I2699 Other pulmonary embolism without acute cor pulmonale: Secondary | ICD-10-CM | POA: Diagnosis present

## 2017-06-18 DIAGNOSIS — D500 Iron deficiency anemia secondary to blood loss (chronic): Secondary | ICD-10-CM

## 2017-06-18 DIAGNOSIS — R5383 Other fatigue: Secondary | ICD-10-CM | POA: Diagnosis not present

## 2017-06-18 DIAGNOSIS — K589 Irritable bowel syndrome without diarrhea: Secondary | ICD-10-CM | POA: Insufficient documentation

## 2017-06-18 DIAGNOSIS — Z7901 Long term (current) use of anticoagulants: Secondary | ICD-10-CM | POA: Diagnosis not present

## 2017-06-18 LAB — CBC WITH DIFFERENTIAL (CANCER CENTER ONLY)
BASOS PCT: 1 %
Basophils Absolute: 0 10*3/uL (ref 0.0–0.1)
EOS ABS: 0.2 10*3/uL (ref 0.0–0.5)
EOS PCT: 4 %
HCT: 38.5 % (ref 34.8–46.6)
HEMOGLOBIN: 12.8 g/dL (ref 11.6–15.9)
Lymphocytes Relative: 45 %
Lymphs Abs: 2 10*3/uL (ref 0.9–3.3)
MCH: 29.3 pg (ref 26.0–34.0)
MCHC: 33.2 g/dL (ref 32.0–36.0)
MCV: 88.1 fL (ref 81.0–101.0)
MONOS PCT: 10 %
Monocytes Absolute: 0.5 10*3/uL (ref 0.1–0.9)
NEUTROS PCT: 40 %
Neutro Abs: 1.8 10*3/uL (ref 1.5–6.5)
Platelet Count: 226 10*3/uL (ref 145–400)
RBC: 4.37 MIL/uL (ref 3.70–5.32)
RDW: 14.9 % (ref 11.1–15.7)
WBC: 4.5 10*3/uL (ref 3.9–10.0)

## 2017-06-18 LAB — CMP (CANCER CENTER ONLY)
ALBUMIN: 3.7 g/dL (ref 3.5–5.0)
ALK PHOS: 62 U/L (ref 40–150)
ALT: 20 U/L (ref 0–55)
ANION GAP: 10 (ref 3–11)
AST: 21 U/L (ref 5–34)
BUN: 20 mg/dL (ref 7–26)
CALCIUM: 9.6 mg/dL (ref 8.4–10.4)
CHLORIDE: 104 mmol/L (ref 98–109)
CO2: 28 mmol/L (ref 22–29)
Creatinine: 1.07 mg/dL (ref 0.60–1.10)
GFR, Estimated: 54 mL/min — ABNORMAL LOW (ref >60)
GLUCOSE: 97 mg/dL (ref 70–140)
POTASSIUM: 4.1 mmol/L (ref 3.5–5.1)
SODIUM: 142 mmol/L (ref 136–145)
Total Bilirubin: 0.3 mg/dL (ref 0.2–1.2)
Total Protein: 7.5 g/dL (ref 6.4–8.3)

## 2017-06-18 LAB — D-DIMER, QUANTITATIVE: D-Dimer, Quant: 0.43 ug/mL-FEU (ref 0.00–0.50)

## 2017-06-18 NOTE — Progress Notes (Signed)
Hematology and Oncology Follow Up Visit  Jennifer DAGOSTINO [id number] May 18, 1950 67 y.o. 06/18/2017   Principle Diagnosis:  Recurrent pulmonary embolism  Current Therapy:   Xarelto 10 mg po q day - maintenance for 1 year   Interim History:  [name] is here today for follow-up. She is doing fairly well but is still having blood in her stool. She has not yet followed up with [name] but states she will contact his office today. She has been symptomatic with fatigue. She is taking 10 mg of Xarelto daily. She denies any other episodes of bleeding. No bruising or petechiae.  Hgb is stable at 12.8 with an MCV of 88.  No fever, chills, n/v, cough, rash, dizziness, SOB, chest pain, palpitations, abdominal pain or changes in bowel or bladder habits.  Her IBS is unchanged.  The neuropathy in her hands and feet is unchanged.  No tenderness in her extremities. She wears compression stockings daily for support.  No falls or syncopal episodes.   ECOG Performance Status: 1 - Symptomatic but completely ambulatory  Medications:  Allergies as of 06/18/2017      Reactions   Morphine And Related Anaphylaxis, Shortness Of Breath   Promethazine Hcl Other (See Comments)   Hallucinations   Sulfonamide Derivatives Hives      Medication List        Accurate as of 06/18/17  1:25 PM. Always use your most recent med list.          acetaminophen 500 MG tablet Commonly known as:  TYLENOL Take 1 tablet (500 mg total) by mouth every 6 (six) hours as needed.   ACIPHEX 20 MG tablet Generic drug:  RABEprazole TAKE 1 TABLET BY MOUTH TWICE DAILY   albuterol 108 (90 Base) MCG/ACT inhaler Commonly known as:  VENTOLIN HFA Inhale 2 puffs into the lungs every 6 (six) hours as needed for wheezing or shortness of breath.   ALPRAZolam 0.5 MG tablet Commonly known as:  XANAX Take 1 tablet (0.5 mg total) by mouth daily as needed for anxiety.   bacitracin ointment Apply 1 application topically 2 (two)  times daily.   cycloSPORINE 0.05 % ophthalmic emulsion Commonly known as:  RESTASIS Place 1 drop into both eyes 2 (two) times daily.   escitalopram 10 MG tablet Commonly known as:  LEXAPRO TAKE 1 TABLET BY MOUTH ONCE DAILY   glucose blood test strip Check blood sugar once daily   hyoscyamine 0.125 MG SL tablet Commonly known as:  LEVSIN SL DISSOLVE ONE TABLET IN MOUTH EVERY 4 HOURS AS NEEDED   ONETOUCH DELICA LANCETS 23J Misc Check blood sugar once daily   temazepam 30 MG capsule Commonly known as:  RESTORIL TAKE 1 CAPSULE BY MOUTH ONCE DAILY AT BEDTIME AS NEEDED FOR SLEEP   triamterene-hydrochlorothiazide 37.5-25 MG tablet Commonly known as:  MAXZIDE-25 Take 1 tablet by mouth daily.   XARELTO 10 MG Tabs tablet Generic drug:  rivaroxaban TAKE ONE TABLET BY MOUTH ONCE DAILY WITH SUPPER       Allergies:  Allergies  Allergen Reactions  . Morphine And Related Anaphylaxis and Shortness Of Breath  . Promethazine Hcl Other (See Comments)    Hallucinations  . Sulfonamide Derivatives Hives    Past Medical History, Surgical history, Social history, and Family History were reviewed and updated.  Review of Systems: All other 10 point review of systems is negative.   Physical Exam:  vitals were not taken for this visit.   Wt Readings from Last 3  Encounters:  05/07/17 255 lb 6 oz (115.8 kg)  03/26/17 254 lb 4 oz (115.3 kg)  03/16/17 257 lb (116.6 kg)    Ocular: Sclerae unicteric, pupils equal, round and reactive to light Ear-nose-throat: Oropharynx clear, dentition fair Lymphatic: No cervical, supraclavicular or axillary adenopathy Lungs no rales or rhonchi, good excursion bilaterally Heart regular rate and rhythm, no murmur appreciated Abd soft, nontender, positive bowel sounds, no liver or spleen tip palpated on exam, no fluid wave  MSK no focal spinal tenderness, no joint edema Neuro: non-focal, well-oriented, appropriate affect Breasts: Deferred   Lab Results   Component Value Date   WBC 4.6 02/16/2017   HGB 12.1 02/16/2017   HCT 36.6 02/16/2017   MCV 87 02/16/2017   PLT 239 02/16/2017   Lab Results  Component Value Date   FERRITIN 66 02/16/2017   IRON 49 02/16/2017   TIBC 284 02/16/2017   UIBC 235 02/16/2017   IRONPCTSAT 17 (L) 02/16/2017   Lab Results  Component Value Date   RBC 4.21 02/16/2017   No results found for: KPAFRELGTCHN, LAMBDASER, KAPLAMBRATIO No results found for: IGGSERUM, IGA, IGMSERUM No results found for: [name], SPEI   Chemistry      Component Value Date/Time   NA 145 05/07/2017 1353   NA 143 02/16/2017 1450   NA 142 01/19/2016 1141   K 4.0 05/07/2017 1353   K 3.7 02/16/2017 1450   K 3.7 01/19/2016 1141   CL 105 05/07/2017 1353   CL 105 02/16/2017 1450   CO2 34 (H) 05/07/2017 1353   CO2 32 02/16/2017 1450   CO2 27 01/19/2016 1141   BUN 14 05/07/2017 1353   BUN 14 02/16/2017 1450   BUN 19.5 01/19/2016 1141   CREATININE 0.93 05/07/2017 1353   CREATININE 1.2 02/16/2017 1450   CREATININE 0.9 01/19/2016 1141      Component Value Date/Time   CALCIUM 9.0 05/07/2017 1353   CALCIUM 8.8 02/16/2017 1450   CALCIUM 8.8 01/19/2016 1141   ALKPHOS 57 02/16/2017 1450   ALKPHOS 59 01/19/2016 1141   AST 27 02/16/2017 1450   AST 21 01/19/2016 1141   ALT 27 02/16/2017 1450   ALT 18 01/19/2016 1141   BILITOT 0.50 02/16/2017 1450   BILITOT 0.31 01/19/2016 1141      Impression and Plan: [name] is a very pleasant 67 yo African American female with history of recurrent PE. Her initial thrombus was diagnosed with her initial thrombus in 2012 and then recurred in 2015.  She is dong well on low does Xarelto 10 mg PO daily and has not had another recurrence.  She is still having blood in her stool and states that she will follow-up with [name] office today.  She is feeling fatigued.  I spoke with [name] and we will reduce her Xarelto to 5 mg PO  daily. She verbalized understanding.  Iron studies for today are pending. Hgb is stable at 12.8.  We will bring her back in for infusion later this week if needed.  We will go ahead and plan to see her back in another 2 months.  She will contact our office with any questions or concerns. We can certainly see her sooner if need be.   Laverna Peace, NP 2/25/20191:25 PM

## 2017-06-19 LAB — IRON AND TIBC
IRON: 59 ug/dL (ref 41–142)
Saturation Ratios: 24 % (ref 21–57)
TIBC: 243 ug/dL (ref 236–444)
UIBC: 184 ug/dL

## 2017-06-19 LAB — FERRITIN: Ferritin: 220 ng/mL (ref 9–269)

## 2017-06-27 ENCOUNTER — Ambulatory Visit (INDEPENDENT_AMBULATORY_CARE_PROVIDER_SITE_OTHER): Payer: Federal, State, Local not specified - PPO | Admitting: Gastroenterology

## 2017-06-27 ENCOUNTER — Encounter: Payer: Self-pay | Admitting: Gastroenterology

## 2017-06-27 VITALS — BP 112/80 | HR 80 | Ht 69.0 in | Wt 256.0 lb

## 2017-06-27 DIAGNOSIS — K648 Other hemorrhoids: Secondary | ICD-10-CM | POA: Diagnosis not present

## 2017-06-27 DIAGNOSIS — K625 Hemorrhage of anus and rectum: Secondary | ICD-10-CM | POA: Diagnosis not present

## 2017-06-27 HISTORY — DX: Other hemorrhoids: K64.8

## 2017-06-27 MED ORDER — HYDROCORTISONE ACETATE 25 MG RE SUPP: 25.0000 mg | Freq: Every day | RECTAL | 1 refills | Status: DC

## 2017-06-27 NOTE — Patient Instructions (Signed)
We have sent the following medications to your pharmacy for you to pick up at your convenience:  Hydrocortisone suppositories at bedtime

## 2017-06-27 NOTE — Progress Notes (Addendum)
06/27/2017 FLAVIA BRUSS [id number] 1950-12-11   HISTORY OF PRESENT ILLNESS: This is a 67 year old female who is known to [name].  Has history of IBS.  Her last colonoscopy was in August 2017 at which time she was found to have 3 polyps that were removed, one adenoma, one sessile serrated adenoma, one lymphoid aggregate.  Repeat colonoscopy recommended in 5 years.  Also had mild diverticulosis.  She presents here today with complaints of rectal bleeding.  She is on Xarelto for history of PEs.  She tells me that she has had rectal bleeding on and off for quite some time.  For the past month she's had bleeding with about every bowel movement.  Usually only occurs with bowel movements, but she did have an episode last week where she passed some clots without a bowel movement.  She tells me that [name] had mentioned hemorrhoid banding to her in the past.  Hgb is normal and iron studies are normal.  She did receive one dose of IV iron per [name] previously.     Past Medical History:  Diagnosis Date  . Anemia   . Anxiety   . Chronic chest pain   . Chronic fatigue 02/03/2017  . Colon polyps    inflamed partially ulcerated hyperplastic polyp  . Diabetes ([place]) 09/24/2013  . Diverticulosis   . Dysphagia esophageal phase - chronic after 3 fundoplications 3/87/5643  . Esophageal stenosis    Stricture after fundoplication REDO  . GERD (gastroesophageal reflux disease)   . Hemorrhoids   . Hypertension   . IBS (irritable bowel syndrome)   . Mitral valve prolapse   . OSA (obstructive sleep apnea)    on CPAP-Mild - Sleep study 2004  . Pulmonary embolus ([place])    2010 and 12-2013  . Shortness of breath    with exertion  . Sleep apnea    wears CPAP  . Tinnitus    Subjective  . Zoster 2014   R flank   Past Surgical History:  Procedure Laterality Date  . ABDOMINAL HYSTERECTOMY     still has 1 ovary   . APPENDECTOMY    . [place]   Stem surgery, Arnold Chiari  Malformation  . BREAST REDUCTION SURGERY    . CATARACT EXTRACTION Bilateral 2017   cataracts, Dr. Herbert Deaner  . CHOLECYSTECTOMY  2008  . COLONOSCOPY    . ESOPHAGOGASTRODUODENOSCOPY    . HERNIA REPAIR     Hital Hernia  . KNEE ARTHROSCOPY  05/16/2012   Procedure: ARTHROSCOPY KNEE;  Surgeon: Sharmon Revere, MD;  Location: [place];  Service: Orthopedics;  Laterality: Left;  . NISSEN FUNDOPLICATION     X 3 lab, open x 2 last with mesh  . REDUCTION MAMMAPLASTY Bilateral 2007    reports that  has never smoked. she has never used smokeless tobacco. She reports that she does not drink alcohol or use drugs. family history includes CAD in her sister; Colon cancer in her maternal grandmother; Diabetes in her mother; Hypertension in her father, mother, and sister; Rheum arthritis in her mother, sister, sister, sister, and sister; Stomach cancer in her maternal uncle. Allergies  Allergen Reactions  . Morphine And Related Anaphylaxis and Shortness Of Breath  . Promethazine Hcl Other (See Comments)    Hallucinations  . Sulfonamide Derivatives Hives      Outpatient Encounter Medications as of 06/27/2017  Medication Sig  . acetaminophen (TYLENOL) 500 MG tablet Take 1 tablet (500 mg  total) by mouth every 6 (six) hours as needed.  . ACIPHEX 20 MG tablet TAKE 1 TABLET BY MOUTH TWICE DAILY  . albuterol (VENTOLIN HFA) 108 (90 Base) MCG/ACT inhaler Inhale 2 puffs into the lungs every 6 (six) hours as needed for wheezing or shortness of breath.  . ALPRAZolam (XANAX) 0.5 MG tablet Take 1 tablet (0.5 mg total) by mouth daily as needed for anxiety.  . bacitracin ointment Apply 1 application topically 2 (two) times daily.  . cycloSPORINE (RESTASIS) 0.05 % ophthalmic emulsion Place 1 drop into both eyes 2 (two) times daily.   Marland Kitchen escitalopram (LEXAPRO) 10 MG tablet TAKE 1 TABLET BY MOUTH ONCE DAILY  . glucose blood test strip Check blood sugar once daily  . hyoscyamine (LEVSIN SL) 0.125 MG SL tablet DISSOLVE ONE TABLET  IN MOUTH EVERY 4 HOURS AS NEEDED  . ONETOUCH DELICA LANCETS 32K MISC Check blood sugar once daily  . temazepam (RESTORIL) 30 MG capsule TAKE 1 CAPSULE BY MOUTH ONCE DAILY AT BEDTIME AS NEEDED FOR SLEEP  . triamterene-hydrochlorothiazide (MAXZIDE-25) 37.5-25 MG tablet Take 1 tablet by mouth daily.  Alveda Reasons 10 MG TABS tablet TAKE ONE TABLET BY MOUTH ONCE DAILY WITH SUPPER (Patient taking differently: TAKE ONE half TABLET BY MOUTH ONCE DAILY WITH SUPPER)   Facility-Administered Encounter Medications as of 06/27/2017  Medication  . 0.9 %  sodium chloride infusion     REVIEW OF SYSTEMS  : All other systems reviewed and negative except where noted in the History of Present Illness.   PHYSICAL EXAM: BP 112/80   Pulse 80   Ht 5\' 9"  (1.753 m)   Wt 256 lb (116.1 kg)   BMI 37.80 kg/m  General: Well developed black female in no acute distress Head: Normocephalic and atraumatic Eyes:  Sclerae anicteric, conjunctiva pink. Ears: Normal auditory acuity Lungs: Clear throughout to auscultation; no increased WOB. Heart: Regular rate and rhythm; no M/R/G. Abdomen: Soft, non-distended.  BS present.  Non-tender. Rectal:  No external abnormalities noted.  DRE revealed a small amount of firm stool in rectal vault.  Light brown stool on exam glove.  Anoscopy revealed internal hemorrhoids with definite bleeding site identified.   Musculoskeletal: Symmetrical with no gross deformities  Skin: No lesions on visible extremities Extremities: No edema  Neurological: Alert oriented x 4, grossly non-focal Psychological:  Alert and cooperative. Normal mood and affect  ASSESSMENT AND PLAN: *Rectal bleeding:  Likely hemorrhoidal although no definite source of bleeding seen on exam today.  She is on xarelto so any bleeding that she experiences is going to be exaggerated.  She tells me that [name] mentioned banding to her in the past, but being on xarelto makes that a little more difficult to deal with.  I am  going to have her use hydrocortisone suppositories at bedtime for 7-10 days for now to see if that helps.  She will follow-up with [name] to discuss if banding is an option as she seems very focused on this.   **25 minutes spent with the patient in which at least 50% was spent discussing treatment options.  CC:  Colon Branch, MD  Agree with Ms. Alphia Kava management.  Gatha Mayer, MD, [facility]

## 2017-07-08 ENCOUNTER — Encounter: Payer: Self-pay | Admitting: Pulmonary Disease

## 2017-07-11 ENCOUNTER — Telehealth: Payer: Self-pay | Admitting: Pulmonary Disease

## 2017-07-11 DIAGNOSIS — G4733 Obstructive sleep apnea (adult) (pediatric): Secondary | ICD-10-CM

## 2017-07-11 NOTE — Telephone Encounter (Signed)
Attempted to call pt but no answer. Left message for pt to return our call x1. Will await to place order until pt has called [place] back.

## 2017-07-11 NOTE — Telephone Encounter (Signed)
Patient returning call - she can be reached at [phone]

## 2017-07-11 NOTE — Telephone Encounter (Signed)
Received CPAP download from [facility]. Per RA, he wants to increase her pressure to 12cm. Obtain a download in 4 weeks.

## 2017-07-12 NOTE — Telephone Encounter (Signed)
Spoke with the pt and notified of recs per RA  She verbalized understanding  Order was sent to [facility] 

## 2017-07-22 ENCOUNTER — Encounter: Payer: Self-pay | Admitting: Internal Medicine

## 2017-07-30 ENCOUNTER — Other Ambulatory Visit: Payer: Self-pay | Admitting: Internal Medicine

## 2017-08-08 ENCOUNTER — Other Ambulatory Visit: Payer: Self-pay | Admitting: Internal Medicine

## 2017-08-08 DIAGNOSIS — Z1231 Encounter for screening mammogram for malignant neoplasm of breast: Secondary | ICD-10-CM

## 2017-08-13 ENCOUNTER — Encounter: Payer: Self-pay | Admitting: Hematology & Oncology

## 2017-08-13 ENCOUNTER — Inpatient Hospital Stay: Payer: Federal, State, Local not specified - PPO | Attending: Family | Admitting: Hematology & Oncology

## 2017-08-13 ENCOUNTER — Inpatient Hospital Stay: Payer: Federal, State, Local not specified - PPO

## 2017-08-13 ENCOUNTER — Other Ambulatory Visit: Payer: Self-pay

## 2017-08-13 VITALS — BP 124/68 | HR 65 | Temp 97.9°F | Resp 16 | Wt 257.0 lb

## 2017-08-13 DIAGNOSIS — I2699 Other pulmonary embolism without acute cor pulmonale: Secondary | ICD-10-CM

## 2017-08-13 DIAGNOSIS — Z7901 Long term (current) use of anticoagulants: Secondary | ICD-10-CM | POA: Diagnosis not present

## 2017-08-13 DIAGNOSIS — K648 Other hemorrhoids: Secondary | ICD-10-CM | POA: Diagnosis not present

## 2017-08-13 DIAGNOSIS — Z79899 Other long term (current) drug therapy: Secondary | ICD-10-CM | POA: Insufficient documentation

## 2017-08-13 DIAGNOSIS — D500 Iron deficiency anemia secondary to blood loss (chronic): Secondary | ICD-10-CM

## 2017-08-13 LAB — CBC WITH DIFFERENTIAL (CANCER CENTER ONLY)
Basophils Absolute: 0 10*3/uL (ref 0.0–0.1)
Basophils Relative: 1 %
EOS ABS: 0.2 10*3/uL (ref 0.0–0.5)
EOS PCT: 4 %
HCT: 37 % (ref 34.8–46.6)
Hemoglobin: 12.3 g/dL (ref 11.6–15.9)
LYMPHS ABS: 1.7 10*3/uL (ref 0.9–3.3)
LYMPHS PCT: 44 %
MCH: 29.2 pg (ref 26.0–34.0)
MCHC: 33.2 g/dL (ref 32.0–36.0)
MCV: 87.9 fL (ref 81.0–101.0)
MONO ABS: 0.5 10*3/uL (ref 0.1–0.9)
Monocytes Relative: 12 %
Neutro Abs: 1.5 10*3/uL (ref 1.5–6.5)
Neutrophils Relative %: 39 %
PLATELETS: 237 10*3/uL (ref 145–400)
RBC: 4.21 MIL/uL (ref 3.70–5.32)
RDW: 14.7 % (ref 11.1–15.7)
WBC: 3.8 10*3/uL — AB (ref 3.9–10.0)

## 2017-08-13 LAB — CMP (CANCER CENTER ONLY)
ALT: 27 U/L (ref 0–55)
ANION GAP: 12 — AB (ref 3–11)
AST: 40 U/L — ABNORMAL HIGH (ref 5–34)
Albumin: 4 g/dL (ref 3.5–5.0)
Alkaline Phosphatase: 61 U/L (ref 40–150)
BUN: 16 mg/dL (ref 7–26)
CHLORIDE: 103 mmol/L (ref 98–109)
CO2: 27 mmol/L (ref 22–29)
CREATININE: 1.04 mg/dL (ref 0.60–1.10)
Calcium: 9.4 mg/dL (ref 8.4–10.4)
GFR, EST NON AFRICAN AMERICAN: 56 mL/min — AB (ref >60)
GFR, Est AFR Am: >60 mL/min (ref >60)
Glucose, Bld: 95 mg/dL (ref 70–140)
Potassium: 3.9 mmol/L (ref 3.5–5.1)
SODIUM: 142 mmol/L (ref 136–145)
Total Bilirubin: 0.3 mg/dL (ref 0.2–1.2)
Total Protein: 7.5 g/dL (ref 6.4–8.3)

## 2017-08-13 LAB — D-DIMER, QUANTITATIVE (NOT AT ARMC): D DIMER QUANT: 0.48 ug{FEU}/mL (ref 0.00–0.50)

## 2017-08-13 MED ORDER — RIVAROXABAN 2.5 MG PO TABS: 5.0000 mg | ORAL_TABLET | Freq: Every day | ORAL | 12 refills | Status: DC

## 2017-08-13 NOTE — Progress Notes (Signed)
Hematology and Oncology Follow Up Visit  Jennifer [name] [id number] [birth year] 67 y.o. 08/13/2017   Principle Diagnosis:  Recurrent pulmonary embolism  Current Therapy:   Xarelto 5 mg po q day   Interim History:  Jennifer [name] is here today for follow-up.  Jennifer [name] is back for follow-up.  Jennifer [name] is doing okay.  Jennifer [name] now is on Xarelto 5 mg a day.  I think we really had to keep Jennifer [name] on 5 mg daily for long-term blood clot and control.  Jennifer [name] has had 2 pulmonary emboli.  I does feel a little bit uncomfortable having Jennifer [name] off blood thinner.  Jennifer [name] is not bleeding.  Jennifer [name] sees the gastroenterologist this Friday to talk about Jennifer [name] internal hemorrhoids.  Jennifer [name] is retired.  Jennifer [name] is enjoying retirement.   Jennifer [name] has had no problems with cough or chest wall pain.  Jennifer [name] has had no nausea or vomiting.  Jennifer [name] is had no leg swelling.  Overall, Jennifer [name] performance status is ECOG 0.  Medications:  Allergies as of 08/13/2017      Reactions   Morphine And Related Anaphylaxis, Shortness Of Breath   Promethazine Hcl Other (See Comments)   Hallucinations   Sulfonamide Derivatives Hives      Medication List        Accurate as of 08/13/17  3:36 PM. Always use your most recent med list.          acetaminophen 500 MG tablet Commonly known as:  TYLENOL Take 1 tablet (500 mg total) by mouth every 6 (six) hours as needed.   ACIPHEX 20 MG tablet Generic drug:  RABEprazole TAKE 1 TABLET BY MOUTH TWICE DAILY   albuterol 108 (90 Base) MCG/ACT inhaler Commonly known as:  VENTOLIN HFA Inhale 2 puffs into the lungs every 6 (six) hours as needed for wheezing or shortness of breath.   ALPRAZolam 0.5 MG tablet Commonly known as:  XANAX Take 1 tablet (0.5 mg total) by mouth daily as needed for anxiety.   bacitracin ointment Apply 1 application topically 2 (two) times daily.   cycloSPORINE 0.05 % ophthalmic emulsion Commonly known as:  RESTASIS Place 1 drop into both eyes 2 (two) times daily.   escitalopram 10 MG tablet Commonly  known as:  LEXAPRO TAKE 1 TABLET BY MOUTH ONCE DAILY   glucose blood test strip Check blood sugar once daily   hydrocortisone 25 MG suppository Commonly known as:  ANUSOL-HC Place 1 suppository (25 mg total) rectally at bedtime.   hyoscyamine 0.125 MG SL tablet Commonly known as:  LEVSIN SL DISSOLVE ONE TABLET IN MOUTH EVERY 4 HOURS AS NEEDED   ONETOUCH DELICA LANCETS 63O Misc Check blood sugar once daily   temazepam 30 MG capsule Commonly known as:  RESTORIL TAKE 1 CAPSULE BY MOUTH ONCE DAILY AT BEDTIME AS NEEDED FOR SLEEP   triamterene-hydrochlorothiazide 37.5-25 MG tablet Commonly known as:  MAXZIDE-25 Take 1 tablet by mouth daily.   XARELTO 10 MG Tabs tablet Generic drug:  rivaroxaban TAKE ONE TABLET BY MOUTH ONCE DAILY WITH SUPPER       Allergies:  Allergies  Allergen Reactions  . Morphine And Related Anaphylaxis and Shortness Of Breath  . Promethazine Hcl Other (See Comments)    Hallucinations  . Sulfonamide Derivatives Hives    Past Medical History, Surgical history, Social history, and Family History were reviewed and updated.  Review of Systems: Review of Systems  Constitutional: Negative.   HENT: Negative.   Eyes: Negative.   Respiratory: Negative.   Cardiovascular: Negative.  Gastrointestinal: Negative.   Genitourinary: Negative.   Musculoskeletal: Negative.   Skin: Negative.   Neurological: Negative.   Endo/Heme/Allergies: Negative.   Psychiatric/Behavioral: Negative.      Physical Exam:  weight is 257 lb (116.6 kg). Jennifer [name] oral temperature is 97.9 F (36.6 C). Jennifer [name] blood pressure is 124/68 and Jennifer [name] pulse is 65. Jennifer [name] respiration is 16 and oxygen saturation is 95%.   Wt Readings from Last 3 Encounters:  08/13/17 257 lb (116.6 kg)  06/27/17 256 lb (116.1 kg)  06/18/17 259 lb (117.5 kg)    Physical Exam  Constitutional: Jennifer [name] is oriented to person, place, and time.  HENT:  Head: Normocephalic and atraumatic.  Mouth/Throat: Oropharynx is  clear and moist.  Eyes: Pupils are equal, round, and reactive to light. EOM are normal.  Neck: Normal range of motion.  Cardiovascular: Normal rate, regular rhythm and normal heart sounds.  Pulmonary/Chest: Effort normal and breath sounds normal.  Abdominal: Soft. Bowel sounds are normal.  Musculoskeletal: Normal range of motion. Jennifer [name] exhibits no edema, tenderness or deformity.  Lymphadenopathy:    Jennifer [name] has no cervical adenopathy.  Neurological: Jennifer [name] is alert and oriented to person, place, and time.  Skin: Skin is warm and dry. No rash noted. No erythema.  Psychiatric: Jennifer [name] has a normal mood and affect. Jennifer [name] behavior is normal. Judgment and thought content normal.  Vitals reviewed.    Lab Results  Component Value Date   WBC 3.8 (L) 08/13/2017   HGB 12.3 08/13/2017   HCT 37.0 08/13/2017   MCV 87.9 08/13/2017   PLT 237 08/13/2017   Lab Results  Component Value Date   FERRITIN 220 06/18/2017   IRON 59 06/18/2017   TIBC 243 06/18/2017   UIBC 184 06/18/2017   IRONPCTSAT 24 06/18/2017   Lab Results  Component Value Date   RBC 4.21 08/13/2017   No results found for: KPAFRELGTCHN, LAMBDASER, KAPLAMBRATIO No results found for: IGGSERUM, IGA, IGMSERUM No results found for: [name], SPEI   Chemistry      Component Value Date/Time   NA 142 06/18/2017 1309   NA 143 02/16/2017 1450   NA 142 01/19/2016 1141   K 4.1 06/18/2017 1309   K 3.7 02/16/2017 1450   K 3.7 01/19/2016 1141   CL 104 06/18/2017 1309   CL 105 02/16/2017 1450   CO2 28 06/18/2017 1309   CO2 32 02/16/2017 1450   CO2 27 01/19/2016 1141   BUN 20 06/18/2017 1309   BUN 14 02/16/2017 1450   BUN 19.5 01/19/2016 1141   CREATININE 1.07 06/18/2017 1309   CREATININE 1.2 02/16/2017 1450   CREATININE 0.9 01/19/2016 1141      Component Value Date/Time   CALCIUM 9.6 06/18/2017 1309   CALCIUM 8.8 02/16/2017 1450   CALCIUM 8.8 01/19/2016 1141   ALKPHOS 62  06/18/2017 1309   ALKPHOS 57 02/16/2017 1450   ALKPHOS 59 01/19/2016 1141   AST 21 06/18/2017 1309   AST 21 01/19/2016 1141   ALT 20 06/18/2017 1309   ALT 27 02/16/2017 1450   ALT 18 01/19/2016 1141   BILITOT 0.3 06/18/2017 1309   BILITOT 0.31 01/19/2016 1141      Impression and Plan: Jennifer [name] is a very pleasant 67 yo African American female with history of recurrent PE. Jennifer [name] initial thrombus was diagnosed with Jennifer [name] initial thrombus in 2012 and then recurred in 2015.   At this point, we will keep Jennifer [name] on 5 mg of Xarelto.  I think this is reasonable.  We will plan to get Jennifer [name] back to see [place] in another 6 months.     [name], MD 4/22/20193:36 PM

## 2017-08-14 LAB — FERRITIN: Ferritin: 165 ng/mL (ref 9–269)

## 2017-08-14 LAB — IRON AND TIBC
Iron: 60 ug/dL (ref 41–142)
Saturation Ratios: 23 % (ref 21–57)
TIBC: 260 ug/dL (ref 236–444)
UIBC: 200 ug/dL

## 2017-08-16 ENCOUNTER — Encounter: Payer: Self-pay | Admitting: Internal Medicine

## 2017-09-01 ENCOUNTER — Other Ambulatory Visit: Payer: Self-pay | Admitting: Family Medicine

## 2017-09-04 ENCOUNTER — Ambulatory Visit: Payer: Federal, State, Local not specified - PPO

## 2017-09-05 ENCOUNTER — Encounter: Payer: Self-pay | Admitting: Internal Medicine

## 2017-09-05 ENCOUNTER — Ambulatory Visit (INDEPENDENT_AMBULATORY_CARE_PROVIDER_SITE_OTHER): Payer: Federal, State, Local not specified - PPO | Admitting: Internal Medicine

## 2017-09-05 VITALS — BP 122/78 | HR 64 | Ht 69.0 in | Wt 256.4 lb

## 2017-09-05 DIAGNOSIS — K648 Other hemorrhoids: Secondary | ICD-10-CM

## 2017-09-05 DIAGNOSIS — K580 Irritable bowel syndrome with diarrhea: Secondary | ICD-10-CM

## 2017-09-05 DIAGNOSIS — Z7901 Long term (current) use of anticoagulants: Secondary | ICD-10-CM | POA: Diagnosis not present

## 2017-09-05 MED ORDER — ONDANSETRON 4 MG PO TBDP: 4.0000 mg | ORAL_TABLET | Freq: Three times a day (TID) | ORAL | 0 refills | Status: DC | PRN

## 2017-09-05 NOTE — Assessment & Plan Note (Signed)
Try ondansetron

## 2017-09-05 NOTE — Progress Notes (Signed)
Jennifer [name] Jennifer [name] 67 y.o. [birth year] [id number]  Assessment & Plan:   Encounter Diagnoses  Name Primary?  . Hemorrhoids, internal, with bleeding Yes  . Irritable bowel syndrome with diarrhea   . Long term current use of anticoagulant - Xarelto for Hx PE's     Banding seems reasonable  I have communicated w/ Jennifer [name] and he is ok with holding Xarelto afterwards - will do 5 days after banding  Subjective:   Chief Complaint: bleeding hemorrhoids  HPI Jennifer [name] is here after seeing Jennifer Bogus, PA-C - bleeding hemorrhoids identified at anoscopy and hydrocortisone suppositories Rxed which did lessen bleeding. She has also had Xarelto dose reduced.but still has bleeding. Some prolapse sxs now About 1 x a week she gets cramps and diarrhea - "my IBS" Levsin SL helps w/ cramps some Moves bowels fairly regular other days of week  Still has dysphagia as she has had x years after 3 Nissen fundoplications and no response to esophageal dilations (many)  Diarrhea 1x week Allergies  Allergen Reactions  . Morphine And Related Anaphylaxis and Shortness Of Breath  . Promethazine Hcl Other (See Comments)    Hallucinations  . Sulfonamide Derivatives Hives   Current Meds  Medication Sig  . acetaminophen (TYLENOL) 500 MG tablet Take 1 tablet (500 mg total) by mouth every 6 (six) hours as needed.  . ACIPHEX 20 MG tablet TAKE 1 TABLET BY MOUTH TWICE DAILY  . albuterol (VENTOLIN HFA) 108 (90 Base) MCG/ACT inhaler Inhale 2 puffs into the lungs every 6 (six) hours as needed for wheezing or shortness of breath.  . ALPRAZolam (XANAX) 0.5 MG tablet Take 1 tablet (0.5 mg total) by mouth daily as needed for anxiety.  . bacitracin ointment Apply 1 application topically 2 (two) times daily.  . cycloSPORINE (RESTASIS) 0.05 % ophthalmic emulsion Place 1 drop into both eyes 2 (two) times daily.   Marland Kitchen escitalopram (LEXAPRO) 10 MG tablet TAKE 1 TABLET BY MOUTH ONCE DAILY  . glucose blood test strip Check blood  sugar once daily  . hyoscyamine (LEVSIN SL) 0.125 MG SL tablet DISSOLVE ONE TABLET IN MOUTH EVERY 4 HOURS AS NEEDED  . ONETOUCH DELICA LANCETS 84X MISC Check blood sugar once daily  . rivaroxaban (XARELTO) 2.5 MG TABS tablet Take 2 tablets (5 mg total) by mouth daily.  . temazepam (RESTORIL) 30 MG capsule TAKE 1 CAPSULE BY MOUTH ONCE DAILY AT BEDTIME AS NEEDED FOR SLEEP  . triamterene-hydrochlorothiazide (MAXZIDE-25) 37.5-25 MG tablet Take 1 tablet by mouth daily.   Past Medical History:  Diagnosis Date  . Anemia   . Anxiety   . Chronic chest pain   . Chronic fatigue 02/03/2017  . Colon polyps    inflamed partially ulcerated hyperplastic polyp  . Diabetes ([place]) 09/24/2013  . Diverticulosis   . Dysphagia esophageal phase - chronic after 3 fundoplications 07/15/4008  . Esophageal stenosis    Stricture after fundoplication REDO  . GERD (gastroesophageal reflux disease)   . Hemorrhoids   . Hypertension   . IBS (irritable bowel syndrome)   . Mitral valve prolapse   . OSA (obstructive sleep apnea)    on CPAP-Mild - Sleep study 2004  . Pulmonary embolus ([place])    2010 and 12-2013  . Shortness of breath    with exertion  . Sleep apnea    wears CPAP  . Tinnitus    Subjective  . Z[place] 2014   R flank   Past Surgical History:  Procedure Laterality Date  .  ABDOMINAL HYSTERECTOMY     still has 1 ovary   . APPENDECTOMY    . [place]   Stem surgery, Arnold Chiari Malformation  . BREAST REDUCTION SURGERY    . CATARACT EXTRACTION Bilateral August 04, 2015   cataracts, Dr. Herbert Deaner  . CHOLECYSTECTOMY  2006-08-04  . COLONOSCOPY    . ESOPHAGOGASTRODUODENOSCOPY    . HERNIA REPAIR     Hital Hernia  . KNEE ARTHROSCOPY  05/16/2012   Procedure: ARTHROSCOPY KNEE;  Surgeon: Sharmon Revere, MD;  Location: [facility];  Service: Orthopedics;  Laterality: Left;  . NISSEN FUNDOPLICATION     X 3 lab, open x 2 last with mesh  . 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   Review of Systems   Objective:   Physical Exam BP 122/78   Pulse 64   Ht 5\' 9"  (1.753 m)   Wt 256 lb 6.4 oz (116.3 kg)   BMI 37.86 kg/m  NAD  Jennifer [name], CMA present. Rectal no mass, nontender  Anoscopy Gr 2 internal hemorrhoids all positions

## 2017-09-05 NOTE — Assessment & Plan Note (Signed)
Will band once I have a plan for Xarelto

## 2017-09-05 NOTE — Patient Instructions (Addendum)
Dr Carlean Purl is going to talk with Dr Marin Olp about holding your blood thinner for a hemorrhoid banding. We will be back in touch.   We have sent the following medications to your pharmacy for you to pick up at your convenience: Ondansetron  Try the ondansetron next time you have diarrhea instead of the hyoscyamine.    I appreciate the opportunity to care for you. Silvano Rusk, MD, [facility]

## 2017-09-12 ENCOUNTER — Telehealth: Payer: Self-pay | Admitting: Internal Medicine

## 2017-09-12 NOTE — Telephone Encounter (Signed)
Pt is requesting refill on temazepam.   Last OV: 05/07/2017 Last Fill: 05/07/2017 #30 and 3rf UDS: 11/24/2016 Low risk  NCCR printed- no discrepancies noted- sent for scanning  Please advise.

## 2017-09-12 NOTE — Telephone Encounter (Signed)
Sent!

## 2017-09-18 ENCOUNTER — Telehealth: Payer: Self-pay

## 2017-09-18 NOTE — Telephone Encounter (Signed)
Patient called back and booked a 10/01/17 appointment at 3:15pm. She is aware to hold her xarelto the day of the procedure and that Dr Carlean Purl will tell her when to re-start.

## 2017-09-18 NOTE — Telephone Encounter (Signed)
Left a detailed message to call me back to set up banding appointment.

## 2017-09-18 NOTE — Telephone Encounter (Signed)
-----   Message from Gatha Mayer, MD sent at 09/18/2017 12:47 PM EDT ----- Regarding: FW: hold xarelto ? Needs a banding appt  Will hold Xarelto after ----- Message ----- From: [name], MD Sent: 09/05/2017   4:48 PM To: Gatha Mayer, MD Subject: RE: hold xarelto ?                             No problem-o!!!  Laurey Arrow ----- Message ----- From: Gatha Mayer, MD Sent: 09/05/2017   4:34 PM To: [name], MD Subject: RE: hold xarelto ?                             This is different - bleeding occurs after so I stop it day of and hold after    ----- Message ----- From: [name], MD Sent: 09/05/2017   4:10 PM To: Gatha Mayer, MD Subject: RE: hold xarelto ?                             Glendell Docker:  We stop Xarelto 2 days prior to any surgical procedure.  We re-start day after!!  Laurey Arrow ----- Message ----- From: Gatha Mayer, MD Sent: 09/05/2017   3:36 PM To: [name], MD Subject: hold xarelto ?                                 Laurey Arrow,  [place] you are well.  This nice lady has rectal bleeding from hemorrhoids (I think) and takes Xarelto.  You recently lowered the dose.  Still bleeding and she wants to try hemorrhoid banding.  Due to risk of bleeding from post-banding ulcers I typically hold anti-coagulation at least 5 and hopefully 7 days after the banding.  Since Xarelto wears off quickly if she bleeds and we have to rehold it  if you think fewer days better than I can try that but 5-7 d my hope.  Thanks for your help,  Glendell Docker

## 2017-09-25 ENCOUNTER — Ambulatory Visit
Admission: RE | Admit: 2017-09-25 | Discharge: 2017-09-25 | Disposition: A | Discharge status: Home / Self Care | Payer: Federal, State, Local not specified - PPO | Source: Ambulatory Visit | Attending: Internal Medicine | Admitting: Internal Medicine

## 2017-09-25 DIAGNOSIS — Z1231 Encounter for screening mammogram for malignant neoplasm of breast: Secondary | ICD-10-CM

## 2017-09-26 ENCOUNTER — Other Ambulatory Visit: Payer: Self-pay | Admitting: Internal Medicine

## 2017-09-26 DIAGNOSIS — R928 Other abnormal and inconclusive findings on diagnostic imaging of breast: Secondary | ICD-10-CM

## 2017-09-28 ENCOUNTER — Telehealth: Payer: Self-pay | Admitting: *Deleted

## 2017-09-28 ENCOUNTER — Ambulatory Visit
Admission: RE | Admit: 2017-09-28 | Discharge: 2017-09-28 | Disposition: A | Discharge status: Home / Self Care | Payer: Federal, State, Local not specified - PPO | Source: Ambulatory Visit | Attending: Internal Medicine | Admitting: Internal Medicine

## 2017-09-28 DIAGNOSIS — R928 Other abnormal and inconclusive findings on diagnostic imaging of breast: Secondary | ICD-10-CM

## 2017-09-28 NOTE — Telephone Encounter (Signed)
Received Physician Orders from [place]; forwarded to provider/SLS 06/07

## 2017-10-01 ENCOUNTER — Encounter: Payer: Self-pay | Admitting: Internal Medicine

## 2017-10-01 ENCOUNTER — Ambulatory Visit (INDEPENDENT_AMBULATORY_CARE_PROVIDER_SITE_OTHER): Payer: Federal, State, Local not specified - PPO | Admitting: Internal Medicine

## 2017-10-01 VITALS — BP 104/66 | HR 72 | Ht 68.0 in | Wt 248.0 lb

## 2017-10-01 DIAGNOSIS — K648 Other hemorrhoids: Secondary | ICD-10-CM

## 2017-10-01 DIAGNOSIS — K641 Second degree hemorrhoids: Secondary | ICD-10-CM | POA: Diagnosis not present

## 2017-10-01 MED ORDER — HYOSCYAMINE SULFATE 0.125 MG SL SUBL: SUBLINGUAL_TABLET | SUBLINGUAL | 3 refills | Status: DC

## 2017-10-01 NOTE — Assessment & Plan Note (Signed)
All 3 columns banded.  She will resume Xarelto in 5 days on 16 June.  I will see her back in 2 months to reassess.  We could band and hold Xarelto again if necessary.

## 2017-10-01 NOTE — Progress Notes (Signed)
   HEMORRHOID LIGATION  SXS: Rectal bleeding, fecal smearing anal irritation burning and some pain  Prior a anoscopy has established grade 2 internal hemorrhoids in all 3 positions, she last took her Xarelto yesterday.  We have reviewed with her hematologist about stopping the Xarelto for several days afterwards to reduce the risk of bleeding.    PROCEDURE NOTE: The patient presents with symptomatic grade 2hemorrhoids, requesting rubber band ligation of his/her hemorrhoidal disease.  All risks, benefits and alternative forms of therapy were described and informed consent was obtained.   The anorectum was pre-medicated with 0.125% NTG and 5% lidocaine The decision was made to band all 3 internal hemorrhoid columns, and the [place] was used to perform band ligation without complication.  Digital anorectal examination was then performed to assure proper positioning of the band, and to adjust the banded tissue as required.  The patient was discharged home without pain or other issues.  Dietary and behavioral recommendations were given and along with follow-up instructions.     The following adjunctive treatments were recommended:  Levsin will be refilled to help her IBS D Restart Xarelto 6/16 RTC 2 mos   No complications were encountered and the patient tolerated the procedure well.  I appreciate the opportunity to care for this patient. CC: Colon Branch, MD

## 2017-10-01 NOTE — Patient Instructions (Addendum)
HEMORRHOID BANDING PROCEDURE    FOLLOW-UP CARE   1. The procedure you have had should have been relatively painless since the banding of the area involved does not have nerve endings and there is no pain sensation.  The rubber band cuts off the blood supply to the hemorrhoid and the band may fall off as soon as 48 hours after the banding (the band may occasionally be seen in the toilet bowl following a bowel movement). You may notice a temporary feeling of fullness in the rectum which should respond adequately to plain Tylenol or Motrin.  2. Following the banding, avoid strenuous exercise that evening and resume full activity the next day.  A sitz bath (soaking in a warm tub) or bidet is soothing, and can be useful for cleansing the area after bowel movements.     3. To avoid constipation, take two tablespoons of natural wheat bran, natural oat bran, flax, Benefiber or any over the counter fiber supplement and increase your water intake to 7-8 glasses daily.    4. Unless you have been prescribed anorectal medication, do not put anything inside your rectum for two weeks: No suppositories, enemas, fingers, etc.  5. Occasionally, you may have more bleeding than usual after the banding procedure.  This is often from the untreated hemorrhoids rather than the treated one.  Don't be concerned if there is a tablespoon or so of blood.  If there is more blood than this, lie flat with your bottom higher than your head and apply an ice pack to the area. If the bleeding does not stop within a half an hour or if you feel faint, call our office at [phone] or go to the emergency room.  6. Problems are not common; however, if there is a substantial amount of bleeding, severe pain, chills, fever or difficulty passing urine (very rare) or other problems, you should call us at [phone] or report to the nearest emergency room.  7. Do not stay seated continuously for more than 2-3 hours for a day or two  after the procedure.  Tighten your buttock muscles 10-15 times every two hours and take 10-15 deep breaths every 1-2 hours.  Do not spend more than a few minutes on the toilet if you cannot empty your bowel; instead re-visit the toilet at a later time.    Please follow up with Dr Carlean Purl in 2 months or sooner if needed.  We have sent the following medications to your pharmacy for you to pick up at your convenience: hyoscyamine    RESTART [place] June 16TH.

## 2017-10-02 NOTE — Telephone Encounter (Signed)
Orders faxed to [facility] at [phone]. Form sent for scanning.

## 2017-10-18 ENCOUNTER — Other Ambulatory Visit: Payer: Self-pay

## 2017-10-18 MED ORDER — ONETOUCH DELICA LANCETS 33G MISC: 12 refills | Status: DC

## 2017-10-18 MED ORDER — GLUCOSE BLOOD VI STRP: ORAL_STRIP | 12 refills | Status: DC

## 2017-11-05 ENCOUNTER — Ambulatory Visit (INDEPENDENT_AMBULATORY_CARE_PROVIDER_SITE_OTHER): Payer: Federal, State, Local not specified - PPO | Admitting: Internal Medicine

## 2017-11-05 ENCOUNTER — Encounter: Payer: Self-pay | Admitting: Internal Medicine

## 2017-11-05 VITALS — BP 132/68 | HR 69 | Temp 97.9°F | Resp 16 | Ht 69.0 in | Wt 253.0 lb

## 2017-11-05 DIAGNOSIS — Z79899 Other long term (current) drug therapy: Secondary | ICD-10-CM

## 2017-11-05 DIAGNOSIS — F419 Anxiety disorder, unspecified: Secondary | ICD-10-CM | POA: Diagnosis not present

## 2017-11-05 DIAGNOSIS — E119 Type 2 diabetes mellitus without complications: Secondary | ICD-10-CM | POA: Diagnosis not present

## 2017-11-05 DIAGNOSIS — I10 Essential (primary) hypertension: Secondary | ICD-10-CM | POA: Diagnosis not present

## 2017-11-05 LAB — HEMOGLOBIN A1C: Hgb A1c MFr Bld: 6.2 % (ref 4.6–6.5)

## 2017-11-05 NOTE — Progress Notes (Signed)
Subjective:    Patient ID: Jennifer [name], female    DOB: 06/05/1950, 67 y.o.   MRN: [id number]  DOS:  11/05/2017 Type of visit - description : rov Interval history: In general feeling well. Saw [name], continue with Xarelto HTN: No ambulatory BPs. OSA: Good compliance with CPAP   Review of Systems Had right knee injury, follow-up by Ortho, improving. Had a hemorrhoidal banding by GI, symptoms decreased, currently with no bleeding. Has varicose veins, the ones and the right lower extremity hurts sometimes.  Past Medical History:  Diagnosis Date  . Anemia   . Anxiety   . Chronic chest pain   . Chronic fatigue 02/03/2017  . Colon polyps    inflamed partially ulcerated hyperplastic polyp  . Diabetes ([place]) 09/24/2013  . Diverticulosis   . Dysphagia esophageal phase - chronic after 3 fundoplications 1/76/1607  . Esophageal stenosis    Stricture after fundoplication REDO  . GERD (gastroesophageal reflux disease)   . Hemorrhoids   . Hemorrhoids, internal, with bleeding 06/27/2017  . Hypertension   . IBS (irritable bowel syndrome)   . Mitral valve prolapse   . OSA (obstructive sleep apnea)    on CPAP-Mild - Sleep study 2004  . Pulmonary embolus ([place])    2010 and 12-2013  . Shortness of breath    with exertion  . Sleep apnea    wears CPAP  . Tinnitus    Subjective  . Zoster 2014   R flank    Past Surgical History:  Procedure Laterality Date  . ABDOMINAL HYSTERECTOMY     still has 1 ovary   . APPENDECTOMY    . [place]   Stem surgery, Arnold Chiari Malformation  . BREAST REDUCTION SURGERY    . CATARACT EXTRACTION Bilateral 2017   cataracts, Dr. Herbert Deaner  . CHOLECYSTECTOMY  2008  . COLONOSCOPY    . ESOPHAGOGASTRODUODENOSCOPY    . HERNIA REPAIR     Hital Hernia  . KNEE ARTHROSCOPY  05/16/2012   Procedure: ARTHROSCOPY KNEE;  Surgeon: Sharmon Revere, MD;  Location: [facility];  Service: Orthopedics;  Laterality: Left;  . NISSEN FUNDOPLICATION     X 3 lab,  open x 2 last with mesh  . REDUCTION MAMMAPLASTY Bilateral 2007    Social History   Socioeconomic History  . Marital status: Married    Spouse name: Not on file  . Number of children: 1  . Years of education: Not on file  . Highest education level: Not on file  Occupational History  . Occupation: retired form the [employer] 04-2015    Employer: [employer]  Social Needs  . Financial resource strain: Not on file  . Food insecurity:    Worry: Not on file    Inability: Not on file  . Transportation needs:    Medical: Not on file    Non-medical: Not on file  Tobacco Use  . Smoking status: Never Smoker  . Smokeless tobacco: Never Used  . Tobacco comment: never used tobacco  Substance and Sexual Activity  . Alcohol use: No    Alcohol/week: 0.0 oz  . Drug use: No  . Sexual activity: Yes    Birth control/protection: None  Lifestyle  . Physical activity:    Days per week: Not on file    Minutes per session: Not on file  . Stress: Not on file  Relationships  . Social connections:    Talks on phone: Not on file  Gets together: Not on file    Attends religious service: Not on file    Active member of club or organization: Not on file    Attends meetings of clubs or organizations: Not on file    Relationship status: Not on file  . Intimate partner violence:    Fear of current or ex partner: Not on file    Emotionally abused: Not on file    Physically abused: Not on file    Forced sexual activity: Not on file  Other Topics Concern  . Not on file  Social History Narrative   [employer] Graduate - Technical school   Married Jul 24, 2068 for 2 years, divorced; re-married 07-24-1984 for 2.5 years, divorced; re-married 07-24-1988 for 1 year, divorced; re-married '00   1 son - 2070-07-25   Sister - Died @ 74 [place]          Allergies as of 11/05/2017      Reactions   Morphine And Related Anaphylaxis, Shortness Of Breath   Promethazine Hcl Other (See Comments)   Hallucinations   Sulfonamide Derivatives  Hives      Medication List        Accurate as of 11/05/17  2:12 PM. Always use your most recent med list.          acetaminophen 500 MG tablet Commonly known as:  TYLENOL Take 1 tablet (500 mg total) by mouth every 6 (six) hours as needed.   ACIPHEX 20 MG tablet Generic drug:  RABEprazole TAKE 1 TABLET BY MOUTH TWICE DAILY   albuterol 108 (90 Base) MCG/ACT inhaler Commonly known as:  VENTOLIN HFA Inhale 2 puffs into the lungs every 6 (six) hours as needed for wheezing or shortness of breath.   ALPRAZolam 0.5 MG tablet Commonly known as:  XANAX Take 1 tablet (0.5 mg total) by mouth daily as needed for anxiety.   bacitracin ointment Apply 1 application topically 2 (two) times daily.   cycloSPORINE 0.05 % ophthalmic emulsion Commonly known as:  RESTASIS Place 1 drop into both eyes 2 (two) times daily.   escitalopram 10 MG tablet Commonly known as:  LEXAPRO TAKE 1 TABLET BY MOUTH ONCE DAILY   glucose blood test strip Check blood sugar once daily   hyoscyamine 0.125 MG SL tablet Commonly known as:  LEVSIN SL DISSOLVE ONE TABLET IN MOUTH EVERY 4 HOURS AS NEEDED   ondansetron 4 MG disintegrating tablet Commonly known as:  ZOFRAN-ODT Take 1 tablet (4 mg total) by mouth every 8 (eight) hours as needed for nausea or vomiting. Or diarrhea   ONETOUCH DELICA LANCETS 67T Misc Check blood sugar once daily   temazepam 30 MG capsule Commonly known as:  RESTORIL Take 1 capsule (30 mg total) by mouth at bedtime as needed for sleep.   triamterene-hydrochlorothiazide 37.5-25 MG tablet Commonly known as:  MAXZIDE-25 Take 1 tablet by mouth daily.   XARELTO 2.5 MG Tabs tablet Generic drug:  rivaroxaban Take 2.5 mg by mouth daily.          Objective:   Physical Exam  Skin:      BP 132/68 (BP Location: Left Arm, Patient Position: Sitting, Cuff Size: Normal)   Pulse 69   Temp 97.9 F (36.6 C) (Oral)   Resp 16   Ht 5\' 9"  (1.753 m)   Wt 253 lb (114.8 kg)   SpO2 96%    BMI 37.36 kg/m  General:   Well developed, NAD, see BMI.  HEENT:  Normocephalic . Face symmetric,  atraumatic Lungs:  CTA B Normal respiratory effort, no intercostal retractions, no accessory muscle use. Heart: RRR,  no murmur.  No pretibial edema bilaterally  Skin: Not pale. Not jaundice Neurologic:  alert & oriented X3.  Speech normal, gait appropriate for age and unassisted Psych--  Cognition and judgment appear intact.  Cooperative with normal attention span and concentration.  Behavior appropriate. No anxious or depressed appearing.      Assessment & Plan:    Assessment   DM, + neuropathy. Foot exam 04-2016 HTN Anxiety, insomnia: On restoril prn, lexapro, xanax for flying only Thyroid nodule: Bx (non neoplastic goiter) 2014,  [place] 05-2016, next 2,3 years Pulmonary: --OSA, sleep study 2004, on a CPAP --Pulmonary emboli --2010 and 12-2013 - saw hematology 12-2014, rec anticoag x 2 years (until 12-2015), then xarelto decreased to 10 mg qd, now on 2.95m qd as off 10/2017 -- multiple pulmonary nodule, last CT 11-2013: No further imaging suggested --RAD, inhalers rarely Fibromyalgia CV: --MVP --  ectatic R carotid artery, [place] 05-2016, next 2 years GI: --GERD, Esophageal stenosis --chronic dysphagia s/p  fundoplication 3  -- IBS -- chronic right flank,RUQ pain : since GB surgery 2008 Etiology not clear but likely multifactorial --> Adhesions from the surgery, neuropathy (DM), postherpetic neuralgia, scarring from PE, etc.  MRI T spine done 06-2014: DJD  Zoster --  2014 right flank   PLAN DM: Diet controlled, check a A1c HTN: BP is very good, no ambulatory BPs, last BMP satisfactory, continue Maxide Anxiety, insomnia: On Restoril to help with sleep and occasional Xanax for flying.  UDS and contract today Varicose veins: Has a group of superficial varicose veins on the right leg, no phlebitis on exam today, at this point I do not recommend any treatment unless symptoms are severe.   She is anticoagulated and at risk for complications from prcedures . RTC 04-2018 CPX

## 2017-11-05 NOTE — Progress Notes (Signed)
Pre visit review using our clinic review tool, if applicable. No additional management support is needed unless otherwise documented below in the visit note. 

## 2017-11-05 NOTE — Patient Instructions (Signed)
GO TO THE LAB : Get the blood work     GO TO THE FRONT DESK Schedule your next appointment for a  Physical exam by 04-2018

## 2017-11-06 NOTE — Assessment & Plan Note (Signed)
DM: Diet controlled, check a A1c HTN: BP is very good, no ambulatory BPs, last BMP satisfactory, continue Maxide Anxiety, insomnia: On Restoril to help with sleep and occasional Xanax for flying.  UDS and contract today Varicose veins: Has a group of superficial varicose veins on the right leg, no phlebitis on exam today, at this point I do not recommend any treatment unless symptoms are severe.  She is anticoagulated and at risk for complications from prcedures . RTC 04-2018 CPX

## 2017-11-08 LAB — PAIN MGMT, PROFILE 8 W/CONF, U
6 ACETYLMORPHINE: NEGATIVE ng/mL (ref <10)
ALPHAHYDROXYTRIAZOLAM: NEGATIVE ng/mL (ref <50)
AMINOCLONAZEPAM: NEGATIVE ng/mL (ref <25)
AMPHETAMINES: NEGATIVE ng/mL (ref <500)
Alcohol Metabolites: NEGATIVE ng/mL (ref <500)
Alphahydroxyalprazolam: NEGATIVE ng/mL (ref <25)
Alphahydroxymidazolam: NEGATIVE ng/mL (ref <50)
BENZODIAZEPINES: POSITIVE ng/mL — AB (ref <100)
BUPRENORPHINE, URINE: NEGATIVE ng/mL (ref <5)
Cocaine Metabolite: NEGATIVE ng/mL (ref <150)
Creatinine: 97.5 mg/dL
Hydroxyethylflurazepam: NEGATIVE ng/mL (ref <50)
Lorazepam: NEGATIVE ng/mL (ref <50)
MDMA: NEGATIVE ng/mL (ref <500)
Marijuana Metabolite: NEGATIVE ng/mL (ref <20)
NORDIAZEPAM: NEGATIVE ng/mL (ref <50)
OPIATES: NEGATIVE ng/mL (ref <100)
OXAZEPAM: 1790 ng/mL — AB (ref <50)
OXIDANT: NEGATIVE ug/mL (ref <200)
Oxycodone: NEGATIVE ng/mL (ref <100)
pH: 6.67 (ref 4.5–9.0)

## 2017-11-12 ENCOUNTER — Telehealth: Payer: Self-pay | Admitting: Internal Medicine

## 2017-11-12 NOTE — Telephone Encounter (Signed)
Patient reports severe rectal pain .  Pain is present all the time, but worse with ambulation.  She will come in tomorrow at 3:30

## 2017-11-13 ENCOUNTER — Other Ambulatory Visit (INDEPENDENT_AMBULATORY_CARE_PROVIDER_SITE_OTHER): Payer: Federal, State, Local not specified - PPO

## 2017-11-13 ENCOUNTER — Ambulatory Visit (HOSPITAL_BASED_OUTPATIENT_CLINIC_OR_DEPARTMENT_OTHER)
Admission: RE | Admit: 2017-11-13 | Discharge: 2017-11-13 | Disposition: A | Discharge status: Home / Self Care | Payer: Federal, State, Local not specified - PPO | Source: Ambulatory Visit | Attending: Internal Medicine | Admitting: Internal Medicine

## 2017-11-13 ENCOUNTER — Encounter: Payer: Self-pay | Admitting: Internal Medicine

## 2017-11-13 ENCOUNTER — Ambulatory Visit (INDEPENDENT_AMBULATORY_CARE_PROVIDER_SITE_OTHER): Payer: Federal, State, Local not specified - PPO | Admitting: Internal Medicine

## 2017-11-13 VITALS — BP 122/80 | HR 76 | Ht 67.75 in | Wt 253.0 lb

## 2017-11-13 DIAGNOSIS — K6289 Other specified diseases of anus and rectum: Secondary | ICD-10-CM | POA: Diagnosis not present

## 2017-11-13 DIAGNOSIS — K648 Other hemorrhoids: Secondary | ICD-10-CM | POA: Diagnosis not present

## 2017-11-13 DIAGNOSIS — K5790 Diverticulosis of intestine, part unspecified, without perforation or abscess without bleeding: Secondary | ICD-10-CM | POA: Insufficient documentation

## 2017-11-13 LAB — CBC WITH DIFFERENTIAL/PLATELET
BASOS PCT: 0.5 % (ref 0.0–3.0)
Basophils Absolute: 0 10*3/uL (ref 0.0–0.1)
EOS PCT: 3 % (ref 0.0–5.0)
Eosinophils Absolute: 0.1 10*3/uL (ref 0.0–0.7)
HEMATOCRIT: 37.7 % (ref 36.0–46.0)
HEMOGLOBIN: 12.6 g/dL (ref 12.0–15.0)
Lymphocytes Relative: 45.1 % (ref 12.0–46.0)
Lymphs Abs: 1.9 10*3/uL (ref 0.7–4.0)
MCHC: 33.5 g/dL (ref 30.0–36.0)
MCV: 87.6 fl (ref 78.0–100.0)
MONOS PCT: 12.3 % — AB (ref 3.0–12.0)
Monocytes Absolute: 0.5 10*3/uL (ref 0.1–1.0)
Neutro Abs: 1.7 10*3/uL (ref 1.4–7.7)
Neutrophils Relative %: 39.1 % — ABNORMAL LOW (ref 43.0–77.0)
Platelets: 252 10*3/uL (ref 150.0–400.0)
RBC: 4.31 Mil/uL (ref 3.87–5.11)
RDW: 15 % (ref 11.5–15.5)
WBC: 4.2 10*3/uL (ref 4.0–10.5)

## 2017-11-13 LAB — BASIC METABOLIC PANEL
BUN: 20 mg/dL (ref 6–23)
CHLORIDE: 102 meq/L (ref 96–112)
CO2: 32 mEq/L (ref 19–32)
Calcium: 9.1 mg/dL (ref 8.4–10.5)
Creatinine, Ser: 1.03 mg/dL (ref 0.40–1.20)
GFR: 69.18 mL/min (ref >60.00)
Glucose, Bld: 91 mg/dL (ref 70–99)
POTASSIUM: 3.9 meq/L (ref 3.5–5.1)
SODIUM: 141 meq/L (ref 135–145)

## 2017-11-13 MED ORDER — HYDROCODONE-ACETAMINOPHEN 5-325 MG PO TABS: 1.0000 | ORAL_TABLET | Freq: Four times a day (QID) | ORAL | 0 refills | Status: DC | PRN

## 2017-11-13 MED ORDER — IOPAMIDOL (ISOVUE-300) INJECTION 61%
100.0000 mL | Freq: Once | INTRAVENOUS | Status: AC | PRN
  Administered 2017-11-13: 100 mL via INTRAVENOUS

## 2017-11-13 NOTE — Patient Instructions (Signed)
  Your provider has requested that you go to the basement level for lab work before leaving today. Press "B" on the elevator. The lab is located at the first door on the left as you exit the elevator.   We are providing you with a printed rx for Vicodin to take to the pharmacy.    You have been scheduled for a CT scan of the pelvis at [facility].  You are scheduled on 11/13/17 at 5:30PM. You should arrive 20 minutes prior to your appointment time for registration. Please follow the written instructions below on the day of your exam:  WARNING: IF YOU ARE ALLERGIC TO IODINE/X-RAY DYE, PLEASE NOTIFY RADIOLOGY IMMEDIATELY AT [phone]! YOU WILL BE GIVEN A 13 HOUR PREMEDICATION PREP.   This test typically takes 30-45 minutes to complete.  If you have any questions regarding your exam or if you need to reschedule, you may call the CT department at [phone] between the hours of 8:00 am and 5:00 pm, Monday-Friday.  ________________________________________________________________________  I appreciate the opportunity to care for you. Silvano Rusk, MD, [facility]

## 2017-11-13 NOTE — Progress Notes (Signed)
Jennifer [name] 67 y.o. 12-21-1950 [id number]  Assessment & Plan:   Encounter Diagnoses  Name Primary?  . Rectal pain Yes  . Internal hemorrhoids with complication     I cannot find anything on exam to explain it so I think she needs a CT of the pelvis with contrast. Check CBC and be met.  It is possible that she could have developed a perirectal abscess related to the hemorrhoidal banding but that is quite rare, however I do need to look for it.  Vicodin 5/325 #15 given to the patient i.e. prescription to treat pain.  She is warned it might constipate her.  Further plans pending these results.  Subjective:   Chief Complaint: Rectal pain  HPI Jennifer [name] is here with a 4 to 5-day history of severe rectal pain.  She had hemorrhoidal banding about a month ago and has had a nice response with less bleeding and is pleased with the results.  She had some diarrhea and a flare of IBS last Friday and then started with intense deep rectal pain.  It is worse when she walks.  It might be on the left side but it is hard for her to tell.  There is nothing protruding from the anus or rectum.  Bowel habits have normalized and there is not really significant pain with defecation. Allergies  Allergen Reactions  . Morphine And Related Anaphylaxis and Shortness Of Breath  . Promethazine Hcl Other (See Comments)    Hallucinations  . Sulfonamide Derivatives Hives   Current Meds  Medication Sig  . acetaminophen (TYLENOL) 500 MG tablet Take 1 tablet (500 mg total) by mouth every 6 (six) hours as needed.  . ACIPHEX 20 MG tablet TAKE 1 TABLET BY MOUTH TWICE DAILY  . bacitracin ointment Apply 1 application topically 2 (two) times daily.  . cycloSPORINE (RESTASIS) 0.05 % ophthalmic emulsion Place 1 drop into both eyes 2 (two) times daily.   Marland Kitchen escitalopram (LEXAPRO) 10 MG tablet TAKE 1 TABLET BY MOUTH ONCE DAILY  . glucose blood test strip Check blood sugar once daily  . hyoscyamine (LEVSIN SL) 0.125 MG SL  tablet DISSOLVE ONE TABLET IN MOUTH EVERY 4 HOURS AS NEEDED  . ONETOUCH DELICA LANCETS 75I MISC Check blood sugar once daily  . rivaroxaban (XARELTO) 2.5 MG TABS tablet Take 2.5 mg by mouth daily.  . temazepam (RESTORIL) 30 MG capsule Take 1 capsule (30 mg total) by mouth at bedtime as needed for sleep.  Marland Kitchen triamterene-hydrochlorothiazide (MAXZIDE-25) 37.5-25 MG tablet Take 1 tablet by mouth daily.   Past Medical History:  Diagnosis Date  . Anemia   . Anxiety   . Chronic chest pain   . Chronic fatigue 02/03/2017  . Colon polyps    inflamed partially ulcerated hyperplastic polyp  . Diabetes ([place]) 09/24/2013  . Diverticulosis   . Dysphagia esophageal phase - chronic after 3 fundoplications 4/33/2951  . Esophageal stenosis    Stricture after fundoplication REDO  . GERD (gastroesophageal reflux disease)   . Hemorrhoids   . Hemorrhoids, internal, with bleeding 06/27/2017  . Hypertension   . IBS (irritable bowel syndrome)   . Mitral valve prolapse   . OSA (obstructive sleep apnea)    on CPAP-Mild - Sleep study 2004  . Pulmonary embolus ([place])    2010 and 12-2013  . Shortness of breath    with exertion  . Sleep apnea    wears CPAP  . Tinnitus    Subjective  . Zoster 2014  R flank   Past Surgical History:  Procedure Laterality Date  . ABDOMINAL HYSTERECTOMY     still has 1 ovary   . APPENDECTOMY    . [place]   Stem surgery, Arnold Chiari Malformation  . BREAST REDUCTION SURGERY    . CATARACT EXTRACTION Bilateral 12-Jul-2015   cataracts, Dr. Herbert Deaner  . CHOLECYSTECTOMY  2006/07/11  . HEMORRHOID BANDING    . HERNIA REPAIR     Hital Hernia  . KNEE ARTHROSCOPY  05/16/2012   Procedure: ARTHROSCOPY KNEE;  Surgeon: Sharmon Revere, MD;  Location: [facility];  Service: Orthopedics;  Laterality: Left;  . NISSEN FUNDOPLICATION     X 3 lab, open x 2 last with mesh  . 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   Review of Systems As above no fever reported  Objective:   Physical Exam BP 122/80 (BP Location: Left Arm, Patient Position: Sitting, Cuff Size: Normal)   Pulse 76   Ht 5' 7.75" (1.721 m) Comment: height measured without shoes  Wt 253 lb (114.8 kg)   BMI 38.75 kg/m  NAD Eyes anicteric Appropriate mood and affect Abdomen is soft with mild right upper quadrant tenderness which is chronic  Rectal exam with Jennifer Martinique, CMA present  Normal anoderm and no tenderness or fluctuance or mass or signs of perirectal abscess on the external area and the coccyx and sacrum are not tender  Digital rectal exam is nontender there is formed brown stool no significant mass or rectocele or anything present.  No hint of a fissure on rectal exam.  Anoscopy is performed and shows a smal healing ulcer from hemorrhoid banding in the left lateral position.  Hemorrhoids look improved compared to prior exam.  No bleeding.

## 2017-11-14 NOTE — Progress Notes (Signed)
My Chart note If not better significantly by next week call or message back

## 2017-11-16 ENCOUNTER — Telehealth: Payer: Self-pay | Admitting: Internal Medicine

## 2017-11-16 NOTE — Telephone Encounter (Signed)
I spoke with the patient and she reports that she is gradually feeling better.  As in the message she is taking a warm bath nightly and feels this is helping, I encouraged her to continue.  The pain has change from a sharp, stabbing to a dull ache.  I asked her to please let us know if she doesn't continue to progress.

## 2017-11-18 NOTE — Telephone Encounter (Signed)
Thanks Agree F/U prn

## 2017-12-03 ENCOUNTER — Other Ambulatory Visit: Payer: Self-pay | Admitting: Internal Medicine

## 2017-12-06 ENCOUNTER — Ambulatory Visit (INDEPENDENT_AMBULATORY_CARE_PROVIDER_SITE_OTHER): Payer: Medicare Other | Admitting: Internal Medicine

## 2017-12-06 ENCOUNTER — Encounter: Payer: Self-pay | Admitting: Internal Medicine

## 2017-12-06 DIAGNOSIS — K625 Hemorrhage of anus and rectum: Secondary | ICD-10-CM

## 2017-12-06 DIAGNOSIS — K648 Other hemorrhoids: Secondary | ICD-10-CM

## 2017-12-06 DIAGNOSIS — K582 Mixed irritable bowel syndrome: Secondary | ICD-10-CM

## 2017-12-06 DIAGNOSIS — R1314 Dysphagia, pharyngoesophageal phase: Secondary | ICD-10-CM

## 2017-12-06 NOTE — Patient Instructions (Signed)
   Glad you are better with the hemorrhoids and rectal pain.  Read the handout about esophageal manometry and let me know your thoughts and questions.  It might tell us something and suggest a new treatment.  I appreciate the opportunity to care for you. Gatha Mayer, MD, [facility]

## 2017-12-06 NOTE — Assessment & Plan Note (Addendum)
Improved at this time with only slight bleeding will not retreat Will observe

## 2017-12-06 NOTE — Assessment & Plan Note (Addendum)
We talked about this again today and possibility of dysmotility and treatment with Botox comes to mind to me.  For some reason I do not recall thinking about that in the past.  She will consider having an esophageal manometry to see if we might learn something that could respond to Botox perhaps or may be other medication.  It would most likely be an off label use of Botox but something to consider i.e. diffuse injection into the esophagus and less we saw problems only at the LES though her fundoplication x3 and mesh placement could cause that and I would not expect that to respond to Botox as it is not responded to dilation in the past.

## 2017-12-06 NOTE — Assessment & Plan Note (Signed)
Stable - Levsin helps

## 2017-12-06 NOTE — Progress Notes (Signed)
Jennifer [name] 67 y.o. [birth year] [id number]  Assessment & Plan:   Hemorrhoids, internal, with bleeding Improved at this time with only slight bleeding will not retreat Will observe  Dysphagia esophageal phase - chronic after 3 fundoplications We talked about this again today and possibility of dysmotility and treatment with Botox comes to mind to me.  For some reason I do not recall thinking about that in the past.  She will consider having an esophageal manometry to see if we might learn something that could respond to Botox perhaps or may be other medication.  It would most likely be an off label use of Botox but something to consider i.e. diffuse injection into the esophagus and less we saw problems only at the LES though her fundoplication x3 and mesh placement could cause that and I would not expect that to respond to Botox as it is not responded to dilation in the past.   IBS (irritable bowel syndrome) Stable - Levsin helps   I appreciate the opportunity to care for this patient. CC: Colon Branch, MD     Subjective:   Chief Complaint: Follow-up of bleeding hemorrhoids dysphagia IBS  HPI Jennifer [name] is satisfied with the improvement in hemorrhoidal bleeding after banding of all 3 columns in June.  She reports only occasional rare slight bleeding.  She did suffer with some pain after the banding and a work-up including a pelvic CT failed to reveal any major abnormality and the pain gradually subsided using warm sitz bath's.  She continues to have IBS symptoms with occasional cramps and urgent defecation sensations but Levsin adequately treats that.  She still has painful swallowing that she deals with but it is a bother and a disruption to her quality of life.  This has persisted despite prior attempts at numerous dilations, other medications including treatment of GERD etc.  She has never had an esophageal manometry or at least not in this setting.  She describes very painful first  swallows that improved but she still has painful swallowing after that though not as severe and modifies the types of food that she eats to minimize this but admits that she would rather not have this.  Dilations never did make a significant difference. Allergies  Allergen Reactions  . Morphine And Related Anaphylaxis and Shortness Of Breath  . Promethazine Hcl Other (See Comments)    Hallucinations  . Sulfonamide Derivatives Hives   Current Meds  Medication Sig  . acetaminophen (TYLENOL) 500 MG tablet Take 1 tablet (500 mg total) by mouth every 6 (six) hours as needed.  . ACIPHEX 20 MG tablet TAKE 1 TABLET BY MOUTH TWICE DAILY  . ALPRAZolam (XANAX) 0.5 MG tablet Take 1 tablet (0.5 mg total) by mouth daily as needed for anxiety.  . bacitracin ointment Apply 1 application topically 2 (two) times daily.  . cycloSPORINE (RESTASIS) 0.05 % ophthalmic emulsion Place 1 drop into both eyes 2 (two) times daily.   Marland Kitchen escitalopram (LEXAPRO) 10 MG tablet Take 1 tablet (10 mg total) by mouth daily.  Marland Kitchen glucose blood test strip Check blood sugar once daily  . HYDROcodone-acetaminophen (NORCO/VICODIN) 5-325 MG tablet Take 1 tablet by mouth every 6 (six) hours as needed for moderate pain.  . hyoscyamine (LEVSIN SL) 0.125 MG SL tablet DISSOLVE ONE TABLET IN MOUTH EVERY 4 HOURS AS NEEDED  . ONETOUCH DELICA LANCETS 85I MISC Check blood sugar once daily  . rivaroxaban (XARELTO) 2.5 MG TABS tablet Take 2.5 mg by mouth daily.  Marland Kitchen  temazepam (RESTORIL) 30 MG capsule Take 1 capsule (30 mg total) by mouth at bedtime as needed for sleep.  Marland Kitchen triamterene-hydrochlorothiazide (MAXZIDE-25) 37.5-25 MG tablet Take 1 tablet by mouth daily.   Past Medical History:  Diagnosis Date  . Anemia   . Anxiety   . Chronic chest pain   . Chronic fatigue 02/03/2017  . Colon polyps    inflamed partially ulcerated hyperplastic polyp  . Diabetes ([place]) 09/24/2013  . Diverticulosis   . Dysphagia esophageal phase - chronic after 3  fundoplications 5/36/6440  . Esophageal stenosis    Stricture after fundoplication REDO  . GERD (gastroesophageal reflux disease)   . Hemorrhoids, internal, with bleeding 06/27/2017  . Hypertension   . IBS (irritable bowel syndrome)   . Mitral valve prolapse   . OSA (obstructive sleep apnea)    on CPAP-Mild - Sleep study 28-Jul-2002  . Pulmonary embolus ([place])    July 27, 2008 and 12-2013  . Shortness of breath    with exertion  . Sleep apnea    wears CPAP  . Tinnitus    Subjective  . Zoster 07-27-2012   R flank   Past Surgical History:  Procedure Laterality Date  . ABDOMINAL HYSTERECTOMY     still has 1 ovary   . APPENDECTOMY    . [place]   Stem surgery, Arnold Chiari Malformation  . BREAST REDUCTION SURGERY    . CATARACT EXTRACTION Bilateral 28-Jul-2015   cataracts, Dr. Herbert Deaner  . CHOLECYSTECTOMY  2006-07-28  . HEMORRHOID BANDING    . HERNIA REPAIR     Hital Hernia  . KNEE ARTHROSCOPY  05/16/2012   Procedure: ARTHROSCOPY KNEE;  Surgeon: Sharmon Revere, MD;  Location: [facility];  Service: Orthopedics;  Laterality: Left;  . NISSEN FUNDOPLICATION     X 3 lab, open x 2 last with mesh  . 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   Review of Systems As per HPI  Objective:   Physical Exam BP 106/70 (BP Location: Left Arm, Patient Position: Sitting, Cuff Size: Normal)   Pulse 72   Ht 5' 7.75" (1.721 m)   Wt 253 lb 6 oz (114.9 kg)   BMI 38.81 kg/m  No acute distress pleasant  appropriate mood and affect  15 minutes time spent with patient > half in counseling coordination of care

## 2018-01-02 ENCOUNTER — Telehealth: Payer: Self-pay | Admitting: Internal Medicine

## 2018-01-02 DIAGNOSIS — H2000 Unspecified acute and subacute iridocyclitis: Secondary | ICD-10-CM | POA: Diagnosis not present

## 2018-01-02 DIAGNOSIS — H15101 Unspecified episcleritis, right eye: Secondary | ICD-10-CM | POA: Diagnosis not present

## 2018-01-02 NOTE — Telephone Encounter (Signed)
Copied from [place] [id number]. Topic: Quick Communication - See Telephone Encounter >> Jan 02, 2018  4:26 PM Rosalin Hawking wrote: CRM for notification. See Telephone encounter for: 01/02/18.    Pt dropped off document to be filled out by provider (Document from [facility] - 1 page in small white envelope) Pt would like to be called when document ready at [phone]. Document put at front office tray under providers name.

## 2018-01-04 NOTE — Telephone Encounter (Signed)
Received Medical/Surgical Clearance Form from [facility]; forwarded to provider/SLS 09/13

## 2018-01-07 NOTE — Telephone Encounter (Signed)
Patient is 89, not known cardiovascular disease.  Good compliance with CPAP.  She is clear from my side but needs also clearance from hematology, she takes Xarelto long-term.

## 2018-01-08 NOTE — Telephone Encounter (Signed)
Form faxed to [facility] at [phone]. Form sent for scanning.

## 2018-01-09 ENCOUNTER — Telehealth: Payer: Self-pay | Admitting: Internal Medicine

## 2018-01-09 NOTE — Telephone Encounter (Signed)
Pt is requesting refill on temazepam.   Last OV: 11/05/2017 Last Fill: 09/12/2017 #30 and 3RF UDS: 11/05/2017 Low risk  NCCR in media from 09/12/2017- no discrepancies

## 2018-01-09 NOTE — Telephone Encounter (Signed)
Sent!

## 2018-01-15 DIAGNOSIS — Z7901 Long term (current) use of anticoagulants: Secondary | ICD-10-CM | POA: Diagnosis not present

## 2018-01-15 DIAGNOSIS — M25561 Pain in right knee: Secondary | ICD-10-CM | POA: Diagnosis not present

## 2018-01-15 DIAGNOSIS — M1711 Unilateral primary osteoarthritis, right knee: Secondary | ICD-10-CM | POA: Diagnosis not present

## 2018-01-16 DIAGNOSIS — H15101 Unspecified episcleritis, right eye: Secondary | ICD-10-CM | POA: Diagnosis not present

## 2018-01-16 DIAGNOSIS — H2000 Unspecified acute and subacute iridocyclitis: Secondary | ICD-10-CM | POA: Diagnosis not present

## 2018-01-22 HISTORY — PX: KNEE ARTHROSCOPY: SHX127

## 2018-01-28 ENCOUNTER — Encounter

## 2018-01-28 ENCOUNTER — Ambulatory Visit (INDEPENDENT_AMBULATORY_CARE_PROVIDER_SITE_OTHER): Payer: Medicare Other | Admitting: *Deleted

## 2018-01-28 DIAGNOSIS — Z23 Encounter for immunization: Secondary | ICD-10-CM

## 2018-02-02 ENCOUNTER — Other Ambulatory Visit: Payer: Self-pay | Admitting: Internal Medicine

## 2018-02-04 DIAGNOSIS — S83271A Complex tear of lateral meniscus, current injury, right knee, initial encounter: Secondary | ICD-10-CM | POA: Diagnosis not present

## 2018-02-04 DIAGNOSIS — M2341 Loose body in knee, right knee: Secondary | ICD-10-CM | POA: Diagnosis not present

## 2018-02-04 DIAGNOSIS — S83271D Complex tear of lateral meniscus, current injury, right knee, subsequent encounter: Secondary | ICD-10-CM | POA: Diagnosis not present

## 2018-02-04 DIAGNOSIS — Y999 Unspecified external cause status: Secondary | ICD-10-CM | POA: Diagnosis not present

## 2018-02-04 DIAGNOSIS — M948X6 Other specified disorders of cartilage, lower leg: Secondary | ICD-10-CM | POA: Diagnosis not present

## 2018-02-04 DIAGNOSIS — X58XXXA Exposure to other specified factors, initial encounter: Secondary | ICD-10-CM | POA: Diagnosis not present

## 2018-02-04 DIAGNOSIS — M1711 Unilateral primary osteoarthritis, right knee: Secondary | ICD-10-CM | POA: Diagnosis not present

## 2018-02-04 DIAGNOSIS — G8918 Other acute postprocedural pain: Secondary | ICD-10-CM | POA: Diagnosis not present

## 2018-02-04 DIAGNOSIS — S83231A Complex tear of medial meniscus, current injury, right knee, initial encounter: Secondary | ICD-10-CM | POA: Diagnosis not present

## 2018-02-07 ENCOUNTER — Other Ambulatory Visit: Payer: Federal, State, Local not specified - PPO

## 2018-02-07 ENCOUNTER — Ambulatory Visit: Payer: Federal, State, Local not specified - PPO | Admitting: Family

## 2018-02-19 DIAGNOSIS — M25661 Stiffness of right knee, not elsewhere classified: Secondary | ICD-10-CM | POA: Diagnosis not present

## 2018-02-21 DIAGNOSIS — M25661 Stiffness of right knee, not elsewhere classified: Secondary | ICD-10-CM | POA: Diagnosis not present

## 2018-02-25 DIAGNOSIS — M25661 Stiffness of right knee, not elsewhere classified: Secondary | ICD-10-CM | POA: Diagnosis not present

## 2018-02-27 DIAGNOSIS — M25661 Stiffness of right knee, not elsewhere classified: Secondary | ICD-10-CM | POA: Diagnosis not present

## 2018-03-04 DIAGNOSIS — M25661 Stiffness of right knee, not elsewhere classified: Secondary | ICD-10-CM | POA: Diagnosis not present

## 2018-03-06 DIAGNOSIS — M25661 Stiffness of right knee, not elsewhere classified: Secondary | ICD-10-CM | POA: Diagnosis not present

## 2018-03-11 DIAGNOSIS — M25661 Stiffness of right knee, not elsewhere classified: Secondary | ICD-10-CM | POA: Diagnosis not present

## 2018-03-13 DIAGNOSIS — M25661 Stiffness of right knee, not elsewhere classified: Secondary | ICD-10-CM | POA: Diagnosis not present

## 2018-03-18 DIAGNOSIS — M25661 Stiffness of right knee, not elsewhere classified: Secondary | ICD-10-CM | POA: Diagnosis not present

## 2018-03-20 DIAGNOSIS — M25661 Stiffness of right knee, not elsewhere classified: Secondary | ICD-10-CM | POA: Diagnosis not present

## 2018-03-27 DIAGNOSIS — M25661 Stiffness of right knee, not elsewhere classified: Secondary | ICD-10-CM | POA: Diagnosis not present

## 2018-04-01 ENCOUNTER — Ambulatory Visit: Payer: Federal, State, Local not specified - PPO | Admitting: Pulmonary Disease

## 2018-04-08 DIAGNOSIS — M25661 Stiffness of right knee, not elsewhere classified: Secondary | ICD-10-CM | POA: Diagnosis not present

## 2018-04-10 DIAGNOSIS — M25661 Stiffness of right knee, not elsewhere classified: Secondary | ICD-10-CM | POA: Diagnosis not present

## 2018-04-11 ENCOUNTER — Ambulatory Visit: Payer: Federal, State, Local not specified - PPO | Admitting: Pulmonary Disease

## 2018-04-23 ENCOUNTER — Telehealth: Payer: Self-pay | Admitting: Internal Medicine

## 2018-04-23 NOTE — Telephone Encounter (Signed)
Sent!

## 2018-04-23 NOTE — Telephone Encounter (Signed)
Pt is requesting refill on alprazolam.   Last OV: 11/05/2017 Last Fill: 09/29/2016 #30 and 0RF UDS: 11/05/2017 Low risk  NCCR printed- no discrepancies noted- sent for scanning.

## 2018-05-07 ENCOUNTER — Ambulatory Visit (INDEPENDENT_AMBULATORY_CARE_PROVIDER_SITE_OTHER): Payer: Medicare Other | Admitting: Internal Medicine

## 2018-05-07 ENCOUNTER — Encounter: Payer: Self-pay | Admitting: Internal Medicine

## 2018-05-07 VITALS — BP 108/70 | HR 78 | Temp 98.0°F | Resp 16 | Ht 68.0 in | Wt 256.0 lb

## 2018-05-07 DIAGNOSIS — Z01419 Encounter for gynecological examination (general) (routine) without abnormal findings: Secondary | ICD-10-CM

## 2018-05-07 DIAGNOSIS — Z0000 Encounter for general adult medical examination without abnormal findings: Secondary | ICD-10-CM

## 2018-05-07 DIAGNOSIS — E119 Type 2 diabetes mellitus without complications: Secondary | ICD-10-CM | POA: Diagnosis not present

## 2018-05-07 MED ORDER — ZOSTER VAC RECOMB ADJUVANTED 50 MCG/0.5ML IM SUSR: 0.5000 mL | Freq: Once | INTRAMUSCULAR | 1 refills | Status: AC

## 2018-05-07 NOTE — Patient Instructions (Addendum)
Please schedule Medicare Wellness with Glenard Haring.   GO TO THE LAB : Get the blood work     GO TO THE FRONT DESK Schedule your next appointment for a checkup in 6 to 8 months

## 2018-05-07 NOTE — Progress Notes (Signed)
Subjective:    Patient ID: Jennifer [name], female    DOB: [birth year], 68 y.o.   MRN: [id number]  DOS:  05/07/2018 Type of visit - description: CPX In general feeling well. Good compliance with medication Had a knee arthroscopy and recovering well.  Wt Readings from Last 3 Encounters:  05/07/18 256 lb (116.1 kg)  12/06/17 253 lb 6 oz (114.9 kg)  11/13/17 253 lb (114.8 kg)     Review of Systems Chronic right-sided abdominal pain about the same Occasional cough  a 14 point review of systems is negative except for her chronic concern/sxs that are at baseline.    Past Medical History:  Diagnosis Date  . Anemia   . Anxiety   . Chronic chest pain   . Chronic fatigue 02/03/2017  . Colon polyps    inflamed partially ulcerated hyperplastic polyp  . Diabetes ([place]) 09/24/2013  . Diverticulosis   . Dysphagia esophageal phase - chronic after 3 fundoplications 11/03/4578  . Esophageal stenosis    Stricture after fundoplication REDO  . GERD (gastroesophageal reflux disease)   . Hemorrhoids, internal, with bleeding 06/27/2017  . Hypertension   . IBS (irritable bowel syndrome)   . Mitral valve prolapse   . OSA (obstructive sleep apnea)    on CPAP-Mild - Sleep study 2004  . Pulmonary embolus ([place])    2010 and 12-2013  . Shortness of breath    with exertion  . Sleep apnea    wears CPAP  . Tinnitus    Subjective  . Zoster 2014   R flank    Past Surgical History:  Procedure Laterality Date  . ABDOMINAL HYSTERECTOMY     still has 1 ovary   . APPENDECTOMY    . [place]   Stem surgery, Arnold Chiari Malformation  . BREAST REDUCTION SURGERY    . CATARACT EXTRACTION Bilateral 2017   cataracts, Dr. Herbert Deaner  . CHOLECYSTECTOMY  2008  . HEMORRHOID BANDING    . HERNIA REPAIR     Hital Hernia  . KNEE ARTHROSCOPY  05/16/2012   Procedure: ARTHROSCOPY KNEE;  Surgeon: Sharmon Revere, MD;  Location: [facility];  Service: Orthopedics;  Laterality: Left;  . KNEE ARTHROSCOPY Right 01/2018   . NISSEN FUNDOPLICATION     X 3 lab, open x 2 last with mesh  . REDUCTION MAMMAPLASTY Bilateral 2007    Social History   Socioeconomic History  . Marital status: Married    Spouse name: Not on file  . Number of children: 1  . Years of education: Not on file  . Highest education level: Not on file  Occupational History  . Occupation: retired form the [employer] 04-2015    Employer: [employer]  Social Needs  . Financial resource strain: Not on file  . Food insecurity:    Worry: Not on file    Inability: Not on file  . Transportation needs:    Medical: Not on file    Non-medical: Not on file  Tobacco Use  . Smoking status: Never Smoker  . Smokeless tobacco: Never Used  . Tobacco comment: never used tobacco  Substance and Sexual Activity  . Alcohol use: No    Alcohol/week: 0.0 standard drinks  . Drug use: No  . Sexual activity: Yes    Birth control/protection: None  Lifestyle  . Physical activity:    Days per week: Not on file    Minutes per session: Not on file  . Stress: Not on  file  Relationships  . Social connections:    Talks on phone: Not on file    Gets together: Not on file    Attends religious service: Not on file    Active member of club or organization: Not on file    Attends meetings of clubs or organizations: Not on file    Relationship status: Not on file  . Intimate partner violence:    Fear of current or ex partner: Not on file    Emotionally abused: Not on file    Physically abused: Not on file    Forced sexual activity: Not on file  Other Topics Concern  . Not on file  Social History Narrative   [employer] Graduate - [employer] school   Married 08/07/68 for 2 years, divorced; re-married 1984/08/07 for 2.5 years, divorced; re-married 08-07-1988 for 1 year, divorced; re-married '00   1 son - 08/08/70   Sister - Died @ 89 [place]         Family History  Problem Relation Age of Onset  . Diabetes Mother   . Rheum arthritis Mother   . Hypertension Mother   .  Hypertension Father   . Rheum arthritis Sister   . Hypertension Sister   . Colon cancer Maternal Grandmother   . Rheum arthritis Sister   . Rheum arthritis Sister   . Rheum arthritis Sister   . CAD Sister        MIdx age 52  . Stomach cancer Maternal Uncle   . Breast cancer Neg Hx   . Esophageal cancer Neg Hx   . Rectal cancer Neg Hx      Allergies as of 05/07/2018      Reactions   Morphine And Related Anaphylaxis, Shortness Of Breath   Promethazine Hcl Other (See Comments)   Hallucinations   Sulfonamide Derivatives Hives      Medication List       Accurate as of May 07, 2018 11:59 PM. Always use your most recent med list.        acetaminophen 500 MG tablet Commonly known as:  TYLENOL Take 1 tablet (500 mg total) by mouth every 6 (six) hours as needed.   ACIPHEX 20 MG tablet Generic drug:  RABEprazole TAKE 1 TABLET BY MOUTH TWICE DAILY   ALPRAZolam 0.5 MG tablet Commonly known as:  XANAX TAKE 1 TABLET BY MOUTH ONCE DAILY AS NEEDED FOR ANXIETY   bacitracin ointment Apply 1 application topically 2 (two) times daily.   cycloSPORINE 0.05 % ophthalmic emulsion Commonly known as:  RESTASIS Place 1 drop into both eyes 2 (two) times daily.   escitalopram 10 MG tablet Commonly known as:  LEXAPRO Take 1 tablet (10 mg total) by mouth daily.   glucose blood test strip Check blood sugar once daily   hyoscyamine 0.125 MG SL tablet Commonly known as:  LEVSIN SL DISSOLVE ONE TABLET IN MOUTH EVERY 4 HOURS AS NEEDED   ONETOUCH DELICA LANCETS 58I Misc Check blood sugar once daily   temazepam 30 MG capsule Commonly known as:  RESTORIL TAKE 1 CAPSULE BY MOUTH ONCE DAILY AT BEDTIME AS NEEDED FOR SLEEP   triamterene-hydrochlorothiazide 37.5-25 MG tablet Commonly known as:  MAXZIDE-25 Take 1 tablet by mouth daily.   XARELTO 2.5 MG Tabs tablet Generic drug:  rivaroxaban Take 2.5 mg by mouth daily.   Zoster Vaccine Adjuvanted injection Commonly known as:   SHINGRIX Inject 0.5 mLs into the muscle once for 1 dose.  Objective:   Physical Exam BP 108/70 (BP Location: Left Arm, Patient Position: Sitting, Cuff Size: Normal)   Pulse 78   Temp 98 F (36.7 C) (Oral)   Resp 16   Ht 5\' 8"  (1.727 m)   Wt 256 lb (116.1 kg)   SpO2 97%   BMI 38.92 kg/m  General: Well developed, NAD, BMI noted Neck: No  thyromegaly  HEENT:  Normocephalic . Face symmetric, atraumatic Lungs:  CTA B Normal respiratory effort, no intercostal retractions, no accessory muscle use. Heart: RRR,  no murmur.  No pretibial edema bilaterally  Abdomen:  Not distended, soft, non-tender. No rebound or rigidity.   Skin: Exposed areas without rash. Not pale. Not jaundice Neurologic:  alert & oriented X3.  Speech normal, gait appropriate for age and unassisted Strength symmetric and appropriate for age.  Psych: Cognition and judgment appear intact.  Cooperative with normal attention span and concentration.  Behavior appropriate. No anxious or depressed appearing.     Assessment      Assessment   DM, + neuropathy. Foot exam 04-2016 HTN Anxiety, insomnia: On restoril prn, lexapro, xanax for flying only Thyroid nodule: Bx (non neoplastic goiter) 2014,  [place] 05-2016, next 2,3 years Pulmonary: --OSA, sleep study 2004, on a CPAP --Pulmonary emboli --2010 and 12-2013 - saw hematology 12-2014, rec anticoag x 2 years (until 12-2015), then xarelto decreased to 10 mg qd, now on 2.62m qd as off 10/2017 -- multiple pulmonary nodule, last CT 11-2013: No further imaging suggested --RAD, inhalers rarely Fibromyalgia CV: --MVP --  ectatic R carotid artery, [place] 05-2016, next 2 years GI: --GERD, Esophageal stenosis --chronic dysphagia s/p  fundoplication 3  -- IBS -- chronic right flank,RUQ pain : since GB surgery 2008 Etiology not clear but likely multifactorial --> Adhesions from the surgery, neuropathy (DM), postherpetic neuralgia, scarring from PE, etc.  MRI T spine done  06-2014: DJD  Zoster --  2014 right flank   PLAN DM: Diet controlled, last A1c very good, she still would like to improve her lifestyle, recommend to consider the wellness clinic, info provided.  Check a A1c Morbid obesity: As above Neuropathy: Denies numbness, paresthesias, skin lesions.  We skipped a foot exam today because she has very hard to remove compression stockings. HTN: BP today is very good, continue diuretics. Anxiety, insomnia: Well-controlled on Lexapro, Xanax and Restoril. OSA: On CPAP, good compliance. Chronic anticoagulation: Good compliance. Here for CPX RTC 6 to 8 months

## 2018-05-07 NOTE — Assessment & Plan Note (Signed)
-  Td 2014;  zostavax 2014 ;  Pneumonia shot 2016;  prevnar 2017; had a flu shot ; shingrix rx printed    - Female care: last visit 2015, told to RTC 5 years, refer to gyn ; [facility] 09/2017 - CCS: Colonoscopy 2009, cscope 11-2015, + polyp, next 5 year  per GI letter  -DEXA wnl 04/2017  -labs: CMP, FLP, A1c, TSH -Diet and exercise discussed, would like to do better,   information about the wellness clinics provided

## 2018-05-07 NOTE — Progress Notes (Signed)
Pre visit review using our clinic review tool, if applicable. No additional management support is needed unless otherwise documented below in the visit note. 

## 2018-05-08 LAB — COMPREHENSIVE METABOLIC PANEL
ALT: 16 U/L (ref 0–35)
AST: 19 U/L (ref 0–37)
Albumin: 4.1 g/dL (ref 3.5–5.2)
Alkaline Phosphatase: 60 U/L (ref 39–117)
BUN: 22 mg/dL (ref 6–23)
CALCIUM: 9.3 mg/dL (ref 8.4–10.5)
CHLORIDE: 101 meq/L (ref 96–112)
CO2: 32 meq/L (ref 19–32)
CREATININE: 0.97 mg/dL (ref 0.40–1.20)
GFR: 74.03 mL/min (ref >60.00)
Glucose, Bld: 88 mg/dL (ref 70–99)
POTASSIUM: 4.1 meq/L (ref 3.5–5.1)
Sodium: 140 mEq/L (ref 135–145)
Total Bilirubin: 0.3 mg/dL (ref 0.2–1.2)
Total Protein: 7.1 g/dL (ref 6.0–8.3)

## 2018-05-08 LAB — LIPID PANEL
CHOL/HDL RATIO: 3
CHOLESTEROL: 181 mg/dL (ref 0–200)
HDL: 67.1 mg/dL (ref >39.00)
LDL CALC: 96 mg/dL (ref 0–99)
NonHDL: 113.92
TRIGLYCERIDES: 92 mg/dL (ref 0.0–149.0)
VLDL: 18.4 mg/dL (ref 0.0–40.0)

## 2018-05-08 LAB — MICROALBUMIN / CREATININE URINE RATIO
Creatinine,U: 92 mg/dL
MICROALB/CREAT RATIO: 0.8 mg/g (ref 0.0–30.0)

## 2018-05-08 LAB — TSH: TSH: 2.01 u[IU]/mL (ref 0.35–4.50)

## 2018-05-08 LAB — HEMOGLOBIN A1C: Hgb A1c MFr Bld: 6 % (ref 4.6–6.5)

## 2018-05-08 NOTE — Assessment & Plan Note (Signed)
  DM: Diet controlled, last A1c very good, she still would like to improve her lifestyle, recommend to consider the wellness clinic, info provided.  Check a A1c Morbid obesity: As above Neuropathy: Denies numbness, paresthesias, skin lesions.  We skipped a foot exam today because she has very hard to remove compression stockings. HTN: BP today is very good, continue diuretics. Anxiety, insomnia: Well-controlled on Lexapro, Xanax and Restoril. OSA: On CPAP, good compliance. Chronic anticoagulation: Good compliance. Here for CPX RTC 6 to 8 months

## 2018-05-23 DIAGNOSIS — H26493 Other secondary cataract, bilateral: Secondary | ICD-10-CM | POA: Diagnosis not present

## 2018-05-23 DIAGNOSIS — H40023 Open angle with borderline findings, high risk, bilateral: Secondary | ICD-10-CM | POA: Diagnosis not present

## 2018-05-23 DIAGNOSIS — H35033 Hypertensive retinopathy, bilateral: Secondary | ICD-10-CM | POA: Diagnosis not present

## 2018-05-23 DIAGNOSIS — E119 Type 2 diabetes mellitus without complications: Secondary | ICD-10-CM | POA: Diagnosis not present

## 2018-05-23 LAB — HM DIABETES EYE EXAM

## 2018-05-27 ENCOUNTER — Ambulatory Visit: Payer: Medicare Other | Admitting: *Deleted

## 2018-05-27 ENCOUNTER — Telehealth: Payer: Self-pay

## 2018-05-27 NOTE — Telephone Encounter (Signed)
All questions from [place] answered. Pt scheduled for 01/06/2019. Patient voiced satisfaction with call.

## 2018-05-27 NOTE — Telephone Encounter (Signed)
Copied from [name] [id number]. Topic: Medicare AWV >> May 27, 2018 12:34 PM Jennifer [name] wrote: Reason for CRM: Patient called inquiring why she had to cancel her AWV and states she has a couple of additional questions regarding AWV. Please advise.

## 2018-05-31 ENCOUNTER — Other Ambulatory Visit: Payer: Self-pay | Admitting: Internal Medicine

## 2018-06-08 ENCOUNTER — Other Ambulatory Visit: Payer: Self-pay | Admitting: Internal Medicine

## 2018-06-13 ENCOUNTER — Encounter: Payer: Self-pay | Admitting: Internal Medicine

## 2018-06-19 ENCOUNTER — Telehealth: Payer: Self-pay

## 2018-06-19 ENCOUNTER — Encounter (HOSPITAL_COMMUNITY): Payer: Self-pay | Admitting: *Deleted

## 2018-06-19 ENCOUNTER — Emergency Department (HOSPITAL_COMMUNITY): Payer: Medicare Other

## 2018-06-19 ENCOUNTER — Emergency Department (HOSPITAL_COMMUNITY)
Admission: EM | Admit: 2018-06-19 | Discharge: 2018-06-19 | Disposition: A | Discharge status: Home / Self Care | Payer: Medicare Other | Attending: Emergency Medicine | Admitting: Emergency Medicine

## 2018-06-19 DIAGNOSIS — I10 Essential (primary) hypertension: Secondary | ICD-10-CM | POA: Diagnosis not present

## 2018-06-19 DIAGNOSIS — R55 Syncope and collapse: Secondary | ICD-10-CM | POA: Insufficient documentation

## 2018-06-19 DIAGNOSIS — Z79899 Other long term (current) drug therapy: Secondary | ICD-10-CM | POA: Diagnosis not present

## 2018-06-19 DIAGNOSIS — R079 Chest pain, unspecified: Secondary | ICD-10-CM | POA: Insufficient documentation

## 2018-06-19 DIAGNOSIS — M25512 Pain in left shoulder: Secondary | ICD-10-CM | POA: Diagnosis not present

## 2018-06-19 DIAGNOSIS — Y999 Unspecified external cause status: Secondary | ICD-10-CM | POA: Diagnosis not present

## 2018-06-19 DIAGNOSIS — S0990XA Unspecified injury of head, initial encounter: Secondary | ICD-10-CM

## 2018-06-19 DIAGNOSIS — Y939 Activity, unspecified: Secondary | ICD-10-CM | POA: Diagnosis not present

## 2018-06-19 DIAGNOSIS — Y929 Unspecified place or not applicable: Secondary | ICD-10-CM | POA: Insufficient documentation

## 2018-06-19 DIAGNOSIS — E119 Type 2 diabetes mellitus without complications: Secondary | ICD-10-CM | POA: Diagnosis not present

## 2018-06-19 DIAGNOSIS — W1789XA Other fall from one level to another, initial encounter: Secondary | ICD-10-CM | POA: Insufficient documentation

## 2018-06-19 DIAGNOSIS — S4992XA Unspecified injury of left shoulder and upper arm, initial encounter: Secondary | ICD-10-CM | POA: Diagnosis not present

## 2018-06-19 LAB — URINALYSIS, ROUTINE W REFLEX MICROSCOPIC
Bacteria, UA: NONE SEEN
Bilirubin Urine: NEGATIVE
Glucose, UA: NEGATIVE mg/dL
Hgb urine dipstick: NEGATIVE
Ketones, ur: NEGATIVE mg/dL
Nitrite: NEGATIVE
Protein, ur: NEGATIVE mg/dL
Specific Gravity, Urine: 1.021 (ref 1.005–1.030)
pH: 5 (ref 5.0–8.0)

## 2018-06-19 LAB — BASIC METABOLIC PANEL WITH GFR
Anion gap: 11 (ref 5–15)
BUN: 17 mg/dL (ref 8–23)
CO2: 29 mmol/L (ref 22–32)
Calcium: 9.1 mg/dL (ref 8.9–10.3)
Chloride: 100 mmol/L (ref 98–111)
Creatinine, Ser: 1.05 mg/dL — ABNORMAL HIGH (ref 0.44–1.00)
GFR calc Af Amer: >60 mL/min
GFR calc non Af Amer: 56 mL/min — ABNORMAL LOW
Glucose, Bld: 109 mg/dL — ABNORMAL HIGH (ref 70–99)
Potassium: 3.7 mmol/L (ref 3.5–5.1)
Sodium: 140 mmol/L (ref 135–145)

## 2018-06-19 LAB — CBC
HEMATOCRIT: 39.3 % (ref 36.0–46.0)
Hemoglobin: 12 g/dL (ref 12.0–15.0)
MCH: 27.6 pg (ref 26.0–34.0)
MCHC: 30.5 g/dL (ref 30.0–36.0)
MCV: 90.3 fL (ref 80.0–100.0)
PLATELETS: 257 10*3/uL (ref 150–400)
RBC: 4.35 MIL/uL (ref 3.87–5.11)
RDW: 14.8 % (ref 11.5–15.5)
WBC: 5.7 10*3/uL (ref 4.0–10.5)
nRBC: 0 % (ref 0.0–0.2)

## 2018-06-19 LAB — I-STAT TROPONIN, ED: TROPONIN I, POC: 0 ng/mL (ref 0.00–0.08)

## 2018-06-19 MED ORDER — IOPAMIDOL (ISOVUE-370) INJECTION 76%
INTRAVENOUS | Status: AC
  Administered 2018-06-19: 80 mL
  Filled 2018-06-19: qty 100

## 2018-06-19 MED ORDER — SODIUM CHLORIDE 0.9% FLUSH: 3.0000 mL | Freq: Once | INTRAVENOUS | Status: DC

## 2018-06-19 MED ORDER — ONDANSETRON HCL 4 MG/2ML IJ SOLN
4.0000 mg | Freq: Once | INTRAMUSCULAR | Status: AC
  Administered 2018-06-19: 4 mg via INTRAVENOUS
  Filled 2018-06-19: qty 2

## 2018-06-19 MED ORDER — FENTANYL CITRATE (PF) 100 MCG/2ML IJ SOLN
50.0000 ug | Freq: Once | INTRAMUSCULAR | Status: AC
  Administered 2018-06-19: 50 ug via INTRAVENOUS
  Filled 2018-06-19: qty 2

## 2018-06-19 NOTE — ED Triage Notes (Signed)
To ED for eval after having a syncopal episode after laughing 'real hard'. Pt states this has happened before but today she fell, hitting her shoulder and head on the wall. Pts husband was shaking pt when she came to. Complains of HA and left shoulder pain now. No vomiting. This happened approx 2 hrs pta

## 2018-06-19 NOTE — Telephone Encounter (Signed)
Pt will come in Friday

## 2018-06-19 NOTE — Telephone Encounter (Signed)
FYI

## 2018-06-19 NOTE — ED Notes (Signed)
Pt discharged from ED; instructions provided; Pt encouraged to return to ED if symptoms worsen and to f/u with PCP; Pt verbalized understanding of all instructions 

## 2018-06-19 NOTE — Discharge Instructions (Signed)
You may take Tylenol 1000 mg every 6 hours as needed for pain.  Your CT scan of your head and chest were normal today.  X-ray of your left shoulder showed no acute injury.  Your labs, urine and EKG were also reassuring.

## 2018-06-19 NOTE — ED Provider Notes (Signed)
TIME SEEN: 2:34 AM  CHIEF COMPLAINT: Syncope  HPI: Patient is a 68 year old RHD female with history of hypertension, PE on Xarelto who presents to the emergency department with a syncopal event.  States that she was in her normal state of health and laughing with her husband.  She states she began laughing very hard and passed out.  Husband states that she passed out for only several seconds.  No seizure activity or postictal state.  She hit her left shoulder and head on the wall.  Complaining of headache and left shoulder pain.  No numbness, tingling or focal weakness.  No current chest pain or shortness of breath.  She states with her first pulmonary embolus she felt short of breath but with her second pulmonary embolus she had no symptoms.  States she has had episodes where she last very hard and feels like she is going to pass out but has never passed out before.  No recent fevers, cough, vomiting, diarrhea, bloody stools, melena.  Reports compliance with her Xarelto.  ROS: See HPI Constitutional: no fever  Eyes: no drainage  ENT: no runny nose   Cardiovascular:  no chest pain  Resp: no SOB  GI: no vomiting GU: no dysuria Integumentary: no rash  Allergy: no hives  Musculoskeletal: no leg swelling  Neurological: no slurred speech ROS otherwise negative  PAST MEDICAL HISTORY/PAST SURGICAL HISTORY:  Past Medical History:  Diagnosis Date  . Anemia   . Anxiety   . Chronic chest pain   . Chronic fatigue 02/03/2017  . Colon polyps    inflamed partially ulcerated hyperplastic polyp  . Diabetes ([place]) 09/24/2013  . Diverticulosis   . Dysphagia esophageal phase - chronic after 3 fundoplications 3/53/6144  . Esophageal stenosis    Stricture after fundoplication REDO  . GERD (gastroesophageal reflux disease)   . Hemorrhoids, internal, with bleeding 06/27/2017  . Hypertension   . IBS (irritable bowel syndrome)   . Mitral valve prolapse   . OSA (obstructive sleep apnea)    on CPAP-Mild -  Sleep study 2004  . Pulmonary embolus ([place])    2010 and 12-2013  . Shortness of breath    with exertion  . Sleep apnea    wears CPAP  . Tinnitus    Subjective  . Zoster 2014   R flank    MEDICATIONS:  Prior to Admission medications   Medication Sig Start Date End Date Taking? Authorizing Provider  acetaminophen (TYLENOL) 500 MG tablet Take 1 tablet (500 mg total) by mouth every 6 (six) hours as needed. 11/02/16   Frederica Kuster, PA-C  ACIPHEX 20 MG tablet TAKE 1 TABLET BY MOUTH TWICE DAILY 02/04/18   Gatha Mayer, MD  ALPRAZolam Duanne Moron) 0.5 MG tablet TAKE 1 TABLET BY MOUTH ONCE DAILY AS NEEDED FOR ANXIETY 04/23/18   Colon Branch, MD  bacitracin ointment Apply 1 application topically 2 (two) times daily. 11/02/16   Law, Bea Graff, PA-C  cycloSPORINE (RESTASIS) 0.05 % ophthalmic emulsion Place 1 drop into both eyes 2 (two) times daily.     [provider]  escitalopram (LEXAPRO) 10 MG tablet TAKE 1 TABLET BY MOUTH ONCE DAILY 05/31/18   Kathlene November E, MD  glucose blood test strip Check blood sugar once daily 10/18/17   Colon Branch, MD  hyoscyamine (LEVSIN SL) 0.125 MG SL tablet DISSOLVE ONE TABLET IN MOUTH EVERY 4 HOURS AS NEEDED 10/01/17   Gatha Mayer, MD  [facility] DELICA LANCETS 31V MISC Check  blood sugar once daily 10/18/17   Colon Branch, MD  rivaroxaban (XARELTO) 2.5 MG TABS tablet Take 2.5 mg by mouth daily.    [provider]  temazepam (RESTORIL) 30 MG capsule TAKE 1 CAPSULE BY MOUTH ONCE DAILY AT BEDTIME AS NEEDED FOR SLEEP 01/09/18   Colon Branch, MD  triamterene-hydrochlorothiazide (MAXZIDE-25) 37.5-25 MG tablet Take 1 tablet by mouth daily. 06/10/18   Colon Branch, MD    ALLERGIES:  Allergies  Allergen Reactions  . Morphine And Related Anaphylaxis and Shortness Of Breath  . Promethazine Hcl Other (See Comments)    Hallucinations  . Sulfonamide Derivatives Hives    SOCIAL HISTORY:  Social History   Tobacco Use  . Smoking status: Never Smoker  .  Smokeless tobacco: Never Used  . Tobacco comment: never used tobacco  Substance Use Topics  . Alcohol use: No    Alcohol/week: 0.0 standard drinks    FAMILY HISTORY: Family History  Problem Relation Age of Onset  . Diabetes Mother   . Rheum arthritis Mother   . Hypertension Mother   . Hypertension Father   . Rheum arthritis Sister   . Hypertension Sister   . Colon cancer Maternal Grandmother   . Rheum arthritis Sister   . Rheum arthritis Sister   . Rheum arthritis Sister   . CAD Sister        MIdx age 65  . Stomach cancer Maternal Uncle   . Breast cancer Neg Hx   . Esophageal cancer Neg Hx   . Rectal cancer Neg Hx     EXAM: BP 128/65 (BP Location: Right Arm)   Pulse 77   Temp 98.3 F (36.8 C) (Oral)   Resp 17   Ht 5' 8.5" (1.74 m)   Wt 114.3 kg   SpO2 93%   BMI 37.76 kg/m  CONSTITUTIONAL: Alert and oriented x 4 and responds appropriately to questions. Well-appearing; well-nourished; GCS 15 HEAD: Normocephalic; atraumatic EYES: Conjunctivae clear, PERRL, EOMI ENT: normal nose; no rhinorrhea; moist mucous membranes; pharynx without lesions noted; no dental injury; no septal hematoma NECK: Supple, no meningismus, no LAD; no midline spinal tenderness, step-off or deformity; trachea midline CARD: RRR; S1 and S2 appreciated; no murmurs, no clicks, no rubs, no gallops RESP: Normal chest excursion without splinting or tachypnea; breath sounds clear and equal bilaterally; no wheezes, no rhonchi, no rales; no hypoxia or respiratory distress CHEST:  chest wall stable, no crepitus or ecchymosis or deformity, nontender to palpation; no flail chest ABD/GI: Normal bowel sounds; non-distended; soft, non-tender, no rebound, no guarding; no ecchymosis or other lesions noted PELVIS:  stable, nontender to palpation BACK:  The back appears normal and is non-tender to palpation, there is no CVA tenderness; no midline spinal tenderness, step-off or deformity EXT: Tender to palpation over  the left shoulder with no obvious deformity or loss of fullness of the joint.  She has normal range of motion in all joints.  No joint effusions noted.  Compartments are soft.  Extremities warm and well-perfused.  Otherwise extremities are nontender to palpation. SKIN: Normal color for age and race; warm NEURO: Moves all extremities equally, strength 5/5 in all 4 extremities, cranial nerves II through XII intact, normal speech, normal sensation diffusely PSYCH: The patient's mood and manner are appropriate. Grooming and personal hygiene are appropriate.  MEDICAL DECISION MAKING: Patient here with syncopal event likely secondary to laughter induced syncope/vasovagal syncope.  She does however have a history of pulmonary embolus and has had  previous PEs that are asymptomatic.  Will obtain a CTA of her chest to ensure there is no large PE that could have contributed to her syncopal event today.  She reports compliance with her Xarelto.  We will also check screening troponin as well.  EKG shows no ischemic changes, arrhythmia or interval abnormality.  She denies any infectious symptoms, vomiting or diarrhea, bloody stools or melena.  Her labs are unremarkable including normal hemoglobin, electrolytes.  Urine shows no ketones to suggest dehydration.  Will obtain CT of her head given she hit her head and is complaining of headache and is on Xarelto.  Also obtain x-ray of the left shoulder.  Will give fentanyl, Zofran for symptomatic relief.  ED PROGRESS: CT had unremarkable.  X-ray of the left shoulder shows no acute injury.  CTA of the chest shows no pulmonary embolus.  Troponin negative.  Patient reports feeling better.  Still neurologically intact.  No chest pain or shortness of breath.  Will discharge home.  Suspect laughter induced syncope as the cause of her syncopal event today.  She has a PCP for follow-up.   At this time, I do not feel there is any life-threatening condition present. I have reviewed and  discussed all results (EKG, imaging, lab, urine as appropriate) and exam findings with patient/family. I have reviewed nursing notes and appropriate previous records.  I feel the patient is safe to be discharged home without further emergent workup and can continue workup as an outpatient as needed. Discussed usual and customary return precautions. Patient/family verbalize understanding and are comfortable with this plan.  Outpatient follow-up has been provided as needed. All questions have been answered.      EKG Interpretation  Date/Time:  Wednesday June 19 2018 02:13:21 EST Ventricular Rate:  74 PR Interval:  204 QRS Duration: 82 QT Interval:  396 QTC Calculation: 439 R Axis:   71 Text Interpretation:  Normal sinus rhythm Normal ECG No significant change since last tracing Confirmed by , Cyril Mourning [phone]) on 06/19/2018 2:34:46 AM         , Delice Bison, DO 06/19/18 [id number]

## 2018-06-19 NOTE — Telephone Encounter (Signed)
Needs ED f/u- please schedule at Pt's earliest convenience. Thank you.

## 2018-06-20 ENCOUNTER — Ambulatory Visit (INDEPENDENT_AMBULATORY_CARE_PROVIDER_SITE_OTHER): Payer: Medicare Other | Admitting: Pulmonary Disease

## 2018-06-20 ENCOUNTER — Encounter: Payer: Self-pay | Admitting: Pulmonary Disease

## 2018-06-20 VITALS — BP 104/68 | HR 71 | Ht 68.0 in | Wt 260.0 lb

## 2018-06-20 DIAGNOSIS — G4730 Sleep apnea, unspecified: Secondary | ICD-10-CM | POA: Diagnosis not present

## 2018-06-20 DIAGNOSIS — G4710 Hypersomnia, unspecified: Secondary | ICD-10-CM

## 2018-06-20 DIAGNOSIS — G4733 Obstructive sleep apnea (adult) (pediatric): Secondary | ICD-10-CM

## 2018-06-20 DIAGNOSIS — I10 Essential (primary) hypertension: Secondary | ICD-10-CM

## 2018-06-20 DIAGNOSIS — K219 Gastro-esophageal reflux disease without esophagitis: Secondary | ICD-10-CM

## 2018-06-20 NOTE — Progress Notes (Signed)
   Subjective:    Patient ID: Jennifer [name], female    DOB: 1950-09-20, 68 y.o.   MRN: [id number]  HPI  68 yo never smoker for FU of  positional obstructive sleep apnea   She has mild intermittent cough attributed to GERD - has Nisen's x 3 -last dilation 2018 by Carlean Purl She had a PE in 12/2013 and is on Xarelto since then-followed by [name].    Routine visit today for follow-up of OSA. However she had an ED visit 2/26 for laughter induced syncope.  CT angiogram was negative for PE.  Head CT was negative, troponins negative She did have a bad fall, hurt her left shoulder and a concussion.  Was tearful during the interview today  Alert pressure has been learning our 100s, on Maxide. She is maintained on low-dose rivaroxaban due to prior history of PE  CPAP is working well, denies daytime somnolence or fatigue tremor has settled down with full facemask. No problems with mask or pressure. Download was reviewed which shows residual AHI 21/hour, mainly obstructive events with no leak and good compliance Weight is unchanged   Significant tests/ events reviewed  PSG 01/2003 -wt 210 lbs showed a TST of 250 mins incl 37 mins of REM, RDI was 15/h with lowest desaturation of 84% &moderate snoring, predominantly REM related events.  PSG april'11 (256 lbs )>>Mild-to-moderate obstructive sleep apnea - RDI was 17/h, non supine AHI was only 1.7/h   June , 2011- portable study shows AHI of 10/h with desaturation index 12/h .  psg (250 lbs) aug'11 - cpap titrated to 9 cm, small full face mask   Review of Systems     Objective:   Physical Exam        Assessment & Plan:

## 2018-06-20 NOTE — Assessment & Plan Note (Signed)
She seems to have significant residual obstructive events,  We will increase pressure to auto settings 12 to 15 cm and hopefully this should correct events, we will follow-up with download in 1 month.  Weight loss encouraged, compliance with goal of at least 4-6 hrs every night is the expectation. Advised against medications with sedative side effects Cautioned against driving when sleepy - understanding that sleepiness will vary on a day to day basis

## 2018-06-20 NOTE — Patient Instructions (Signed)
Change to auto settings 12 to 15 cm and repeat download in 1 month. Okay to stop taking Maxide and monitor blood pressure and discussed with Dr. Larose Kells  Cough may be related to reflux

## 2018-06-20 NOTE — Assessment & Plan Note (Signed)
Okay to discontinue Maxide and track blood pressure. Running low 100s in recent episode of syncope

## 2018-06-20 NOTE — Assessment & Plan Note (Signed)
Probably causing cough. She has had Nissen's x3 followed by dilation procedure and is on AcipHex twice daily. - will need GI follow-up

## 2018-06-21 ENCOUNTER — Ambulatory Visit (INDEPENDENT_AMBULATORY_CARE_PROVIDER_SITE_OTHER): Payer: Medicare Other | Admitting: Internal Medicine

## 2018-06-21 ENCOUNTER — Encounter: Payer: Self-pay | Admitting: Internal Medicine

## 2018-06-21 VITALS — BP 102/60 | HR 79 | Temp 98.0°F | Resp 16 | Ht 68.0 in | Wt 261.5 lb

## 2018-06-21 DIAGNOSIS — R55 Syncope and collapse: Secondary | ICD-10-CM

## 2018-06-21 DIAGNOSIS — S060X0D Concussion without loss of consciousness, subsequent encounter: Secondary | ICD-10-CM

## 2018-06-21 DIAGNOSIS — S40012D Contusion of left shoulder, subsequent encounter: Secondary | ICD-10-CM | POA: Diagnosis not present

## 2018-06-21 NOTE — Patient Instructions (Signed)
Please to schedule a visit in 1 month  Stop Maxide your blood pressure medication  Please call if you are not gradually better in the next 5 to 10 days  Go to the ER if: Severe headache, nausea, vomiting, neck pain, increase "cloudiness".  Please read the information about concussion.    Check the  blood pressure 2 or 3 times a   week   Be sure your blood pressure is between 110/65 and  135/85. If it is consistently higher or lower, let me know     Concussion, Adult A concussion is a brain injury from a direct hit (blow) to the head or body. This injury causes the brain to shake quickly back and forth inside the skull. It is caused by:  A hit to the head.  A quick and sudden movement (jolt) of the head or neck. How fast you will get better from a concussion depends on many things. Recovery can take time. It is important to wait to return to activity until a doctor says it is safe and your symptoms are all gone. Follow these instructions at home: Activity  Limit activities that need a lot of thought or concentration. You may need to talk with your work Freight forwarder or teachers about this. Limit activities such as: ? Homework or work for your job. ? Watching TV. ? Computer work. ? Playing memory games and puzzles.  Rest. Rest helps the brain to heal. Make sure you: ? Get plenty of sleep at night. Do not stay up late. ? Rest during the day. Take naps or rest breaks when you feel tired.  Do not do activities that could cause a second concussion, such as riding a bike or playing sports. It can be dangerous if you get another concussion before the first one has healed.  Ask your doctor when you can return to your normal activities, like driving, riding a bike, or using machinery. Your ability to react may be slower. Do not do these activities if you are dizzy. Your doctor will likely give you a plan for slowly going back to activities. General instructions  Take over-the-counter and  prescription medicines only as told by your doctor.  Do not drink alcohol until your doctor says you can.  Watch your symptoms and tell other people to do the same. Other problems (complications) can happen after a concussion. Older adults with a brain injury may have a higher risk of serious problems, such as a blood clot in the brain.  Tell your work Freight forwarder, teachers, Government social research officer, school counselor, coach, or Product/process development scientist about your injury and symptoms. Tell them about what you can or cannot do. They should watch you for: ? More problems with attention or concentration. ? More trouble remembering or learning new information. ? More time needed to do tasks or assignments. ? Being more annoyed (irritable) or having a harder time dealing with stress. ? Any other symptoms that get worse.  Keep all follow-up visits as told by your doctor. This is important. Prevention  It is very important that you donot get another brain injury, especially before you have healed. In rare cases, another injury can cause permanent brain damage, brain swelling, or death. You have the most risk if you get another head injury in the first 7-10 days after you were hurt before. To avoid injuries: ? Avoid activities that could make you get a second concussion, like contact sports. ? When you have returned to sports or activities:  Avoid plays or moves that can cause you to crash into another person. This is how most concussions happen.  Follow the rules and be respectful of other players. ? Get regular exercise that includes strength and balance training. ? Wear a helmet when you do activities like:  Biking.  Skiing.  Skateboarding.  Skating. ? Helmets can help protect you from serious skull and brain injuries, but they do not protect your from a concussion. Even when wearing a helmet, you should avoid being hit in the head. Contact a doctor if:  Your symptoms get worse or they do not get  better.  You have new symptoms.  You have another injury. Get help right away if:  You have bad headaches or your headaches get worse.  You have weakness in any part of your body.  You are confused.  Your coordination gets worse.  You keep throwing up (vomiting).  You feel more sleepy than normal.  You twitch or shake violently (convulse) or have a seizure.  Your speech is not clear (is slurred).  You have strange behavior changes.  You have changes in how you see (vision).  You pass out (lose consciousness). Summary  A concussion is a brain injury from a direct hit (blow) to the head or body.  This condition is treated with rest and careful watching of symptoms.  If you keep having symptoms, call your doctor. This information is not intended to replace advice given to you by your health care provider. Make sure you discuss any questions you have with your health care provider. Document Released: 03/29/2009 Document Revised: 05/22/2017 Document Reviewed: 05/22/2017 Elsevier Interactive Patient Education  2019 [employer].

## 2018-06-21 NOTE — Progress Notes (Signed)
Subjective:    Patient ID: Jennifer [name], female    DOB: November 27, 1950, 68 y.o.   MRN: [id number]  DOS:  06/21/2018 Type of visit - description: ER follow-up  Went to the ER 2 days ago after a syncopal event in the context of laughing really hard.  No seizure activity, episode was witnessed by the husband.  She bumped her left shoulder left and L side of the head on her way down. At the ER, urine and blood work were essentially normal, due to her history of previous PE, CT chest was done with no acute findings. After the fall, she complained of some headache and left shoulder pain, CT head and x-ray of the shoulder were negative.  Yesterday, was seen by pulmonary for her sleep apnea follow-up and BP was low, it is low today as well.  Review of Systems She is here today for a follow-up. She still have a headache, mostly at the site of the impact (L parietal) when she fell   Still has shoulder pain.  She feels tired. Denies any more syncopal episodes.  She however feels a little "mentally cloudy". No confusion per se.  Denies chest pain, difficulty breathing or palpitations No nausea or vomiting. No diplopia, slurred speech or motor deficits.  Past Medical History:  Diagnosis Date  . Anemia   . Anxiety   . Chronic chest pain   . Chronic fatigue 02/03/2017  . Colon polyps    inflamed partially ulcerated hyperplastic polyp  . Diabetes ([place]) 09/24/2013  . Diverticulosis   . Dysphagia esophageal phase - chronic after 3 fundoplications 9/92/4268  . Esophageal stenosis    Stricture after fundoplication REDO  . GERD (gastroesophageal reflux disease)   . Hemorrhoids, internal, with bleeding 06/27/2017  . Hypertension   . IBS (irritable bowel syndrome)   . Mitral valve prolapse   . OSA (obstructive sleep apnea)    on CPAP-Mild - Sleep study 2004  . Pulmonary embolus ([place])    2010 and 12-2013  . Shortness of breath    with exertion  . Sleep apnea    wears CPAP  . Tinnitus    Subjective   . Zoster 2014   R flank    Past Surgical History:  Procedure Laterality Date  . ABDOMINAL HYSTERECTOMY     still has 1 ovary   . APPENDECTOMY    . [place]   Stem surgery, Arnold Chiari Malformation  . BREAST REDUCTION SURGERY    . CATARACT EXTRACTION Bilateral 2017   cataracts, Dr. Herbert Deaner  . CHOLECYSTECTOMY  2008  . HEMORRHOID BANDING    . HERNIA REPAIR     Hital Hernia  . KNEE ARTHROSCOPY  05/16/2012   Procedure: ARTHROSCOPY KNEE;  Surgeon: Sharmon Revere, MD;  Location: [facility];  Service: Orthopedics;  Laterality: Left;  . KNEE ARTHROSCOPY Right 01/2018  . NISSEN FUNDOPLICATION     X 3 lab, open x 2 last with mesh  . REDUCTION MAMMAPLASTY Bilateral 2007    Social History   Socioeconomic History  . Marital status: Married    Spouse name: Not on file  . Number of children: 1  . Years of education: Not on file  . Highest education level: Not on file  Occupational History  . Occupation: retired form the [employer] 04-2015    Employer: [employer]  Social Needs  . Financial resource strain: Not on file  . Food insecurity:    Worry: Not on  file    Inability: Not on file  . Transportation needs:    Medical: Not on file    Non-medical: Not on file  Tobacco Use  . Smoking status: Never Smoker  . Smokeless tobacco: Never Used  . Tobacco comment: never used tobacco  Substance and Sexual Activity  . Alcohol use: No    Alcohol/week: 0.0 standard drinks  . Drug use: No  . Sexual activity: Yes    Birth control/protection: None  Lifestyle  . Physical activity:    Days per week: Not on file    Minutes per session: Not on file  . Stress: Not on file  Relationships  . Social connections:    Talks on phone: Not on file    Gets together: Not on file    Attends religious service: Not on file    Active member of club or organization: Not on file    Attends meetings of clubs or organizations: Not on file    Relationship status: Not on file  . Intimate partner  violence:    Fear of current or ex partner: Not on file    Emotionally abused: Not on file    Physically abused: Not on file    Forced sexual activity: Not on file  Other Topics Concern  . Not on file  Social History Narrative   [employer] Graduate - Technical school   Married 07/31/2068 for 2 years, divorced; re-married 1984/07/31 for 2.5 years, divorced; re-married Jul 31, 1988 for 1 year, divorced; re-married '00   1 son - 08-01-70   Sister - Died @ 25 [place]          Allergies as of 06/21/2018      Reactions   Morphine And Related Anaphylaxis, Shortness Of Breath   Promethazine Hcl Other (See Comments)   Hallucinations   Sulfonamide Derivatives Hives      Medication List       Accurate as of June 21, 2018 11:59 PM. Always use your most recent med list.        acetaminophen 500 MG tablet Commonly known as:  TYLENOL Take 1 tablet (500 mg total) by mouth every 6 (six) hours as needed.   ACIPHEX 20 MG tablet Generic drug:  RABEprazole TAKE 1 TABLET BY MOUTH TWICE DAILY   ALPRAZolam 0.5 MG tablet Commonly known as:  XANAX TAKE 1 TABLET BY MOUTH ONCE DAILY AS NEEDED FOR ANXIETY   bacitracin ointment Apply 1 application topically 2 (two) times daily.   cycloSPORINE 0.05 % ophthalmic emulsion Commonly known as:  RESTASIS Place 1 drop into both eyes 2 (two) times daily.   escitalopram 10 MG tablet Commonly known as:  LEXAPRO TAKE 1 TABLET BY MOUTH ONCE DAILY   glucose blood test strip Check blood sugar once daily   hydrocortisone valerate cream 0.2 % Commonly known as:  WESTCORT as needed.   hyoscyamine 0.125 MG SL tablet Commonly known as:  LEVSIN SL DISSOLVE ONE TABLET IN MOUTH EVERY 4 HOURS AS NEEDED   ONETOUCH DELICA LANCETS 33A Misc Check blood sugar once daily   temazepam 30 MG capsule Commonly known as:  RESTORIL TAKE 1 CAPSULE BY MOUTH ONCE DAILY AT BEDTIME AS NEEDED FOR SLEEP   XARELTO 2.5 MG Tabs tablet Generic drug:  rivaroxaban Take 2.5 mg by mouth daily.            Objective:   Physical Exam BP 102/60 (BP Location: Right Arm, Patient Position: Sitting, Cuff Size: Normal)   Pulse 79  Temp 98 F (36.7 C) (Oral)   Resp 16   Ht 5\' 8"  (1.727 m)   Wt 261 lb 8 oz (118.6 kg)   SpO2 98%   BMI 39.76 kg/m  General:   Well developed, NAD, BMI noted. HEENT:  Normocephalic . Face symmetric, atraumatic. Neck: Full range of motion, no TTP at the cervical spine Lungs:  CTA B Normal respiratory effort, no intercostal retractions, no accessory muscle use. Heart: RRR,  no murmur.  No pretibial edema bilaterally  Skin: Not pale. Not jaundice MSK: Right shoulder normal Left shoulder: Has a small ecchymosis at the deltoid area, range of motion is normal except with forced elevation which caused mild pain. Neurologic:  alert & oriented X3.  Speech normal, gait appropriate for age and unassisted EOMI, pupils equal and reactive, motor and DTR symmetric. Psych--  Cognition and judgment appear intact.  Cooperative with normal attention span and concentration.  Behavior appropriate. No anxious or depressed appearing.      Assessment      Assessment   DM, + neuropathy. Foot exam 04-2016 HTN Anxiety, insomnia: On restoril prn, lexapro, xanax for flying only Thyroid nodule: Bx (non neoplastic goiter) 2014,  [place] 05-2016, next 2,3 years Pulmonary: --OSA, sleep study 2004, on a CPAP --Pulmonary emboli --2010 and 12-2013 - saw hematology 12-2014, rec anticoag x 2 years (until 12-2015), then xarelto decreased to 10 mg qd, now on 2.79m qd as off 10/2017 -- multiple pulmonary nodule, last CT 11-2013: No further imaging suggested --RAD, inhalers rarely Fibromyalgia CV: --MVP --  ectatic R carotid artery, [place] 05-2016, next 2 years GI: --GERD, Esophageal stenosis --chronic dysphagia s/p  fundoplication 3  -- IBS -- chronic right flank,RUQ pain : since GB surgery 2008 Etiology not clear but likely multifactorial --> Adhesions from the surgery, neuropathy  (DM), postherpetic neuralgia, scarring from PE, etc.  MRI T spine done 06-2014: DJD  Zoster --  2014 right flank   PLAN Syncope: Felt to be benign, work-up negative.  No further events.  Observation. Head concussion: The patient injured her head landing on a wall when she fell during the syncope, she has a headache located at the site of the impact.  Reports her mind is a slightly "cloudy" but she is alert and oriented x3.  Not dizzy per se.  Most likely symptoms are postconcussion, recommend close observation,  warning symptoms discussed.  Otherwise follow-up in 1 month. Shoulder contusion: Observation for now  pulmonary emboli: On low-dose Xarelto. RTC 1 month F2F > 25 d/t chart review

## 2018-06-21 NOTE — Progress Notes (Signed)
Pre visit review using our clinic review tool, if applicable. No additional management support is needed unless otherwise documented below in the visit note. 

## 2018-06-22 ENCOUNTER — Telehealth: Payer: Self-pay | Admitting: Internal Medicine

## 2018-06-22 NOTE — Assessment & Plan Note (Signed)
Syncope: Felt to be benign, work-up negative.  No further events.  Observation. Head concussion: The patient injured her head landing on a wall when she fell during the syncope, she has a headache located at the site of the impact.  Reports her mind is a slightly "cloudy" but she is alert and oriented x3.  Not dizzy per se.  Most likely symptoms are postconcussion, recommend close observation,  warning symptoms discussed.  Otherwise follow-up in 1 month. Shoulder contusion: Observation for now  pulmonary emboli: On low-dose Xarelto. RTC 1 month

## 2018-06-24 NOTE — Telephone Encounter (Signed)
Sent!

## 2018-06-24 NOTE — Telephone Encounter (Signed)
Pt is requesting refill on temazepam.   Last OV: 06/21/2018 Last Fill: 01/09/2018 #30 and 4RF UDS: 11/05/2017 Low risk

## 2018-07-19 ENCOUNTER — Telehealth (INDEPENDENT_AMBULATORY_CARE_PROVIDER_SITE_OTHER): Payer: Medicare Other | Admitting: Internal Medicine

## 2018-07-19 ENCOUNTER — Encounter: Payer: Self-pay | Admitting: Internal Medicine

## 2018-07-19 ENCOUNTER — Other Ambulatory Visit: Payer: Self-pay

## 2018-07-19 DIAGNOSIS — Z09 Encounter for follow-up examination after completed treatment for conditions other than malignant neoplasm: Secondary | ICD-10-CM

## 2018-07-19 DIAGNOSIS — R519 Headache, unspecified: Secondary | ICD-10-CM

## 2018-07-19 DIAGNOSIS — R51 Headache: Secondary | ICD-10-CM | POA: Diagnosis not present

## 2018-07-19 DIAGNOSIS — I10 Essential (primary) hypertension: Secondary | ICD-10-CM

## 2018-07-19 NOTE — Progress Notes (Signed)
Virtual Visit via Telephone Note  I connected with Jennifer [name] on 07/19/18 at  1:00 PM EDT by telephone and verified that I am speaking with the correct person using two identifiers.  THIS ENCOUNTER IS A VIRTUAL VISIT DUE TO COVID-19 - PATIENT WAS NOT SEEN IN THE OFFICE. PATIENT HAS CONSENTED TO VIRTUAL VISIT / TELEMEDICINE VISIT   Location of patient: home  Location of provider: office   I discussed the limitations, risks, security and privacy concerns of performing an evaluation and management service by telephone and the availability of in person appointments. I also discussed with the patient that there may be a patient responsible charge related to this service. The patient expressed understanding and agreed to proceed.   History of Present Illness: This is a follow-up from the last visit.  Was seen with a syncope, head contusion and shoulder contusion Since the last visit, states she is feeling a lot better.  Headache has decreased significantly. Denies nausea or vomiting No neck pain. Shoulder pain essentially resolved. Ambulatory BPs 136/77, 134/76, 142/76. Occasional check a CBG: 102. Her mental status is okay, previously she reported that she was "slightly cloudy" and now that is also much improved.   Observations/Objective: We had a normal conversation over the phone, no distress, no issues noted.  Assessment and Plan:  Assessment   DM, + neuropathy. Foot exam 04-2016 HTN Anxiety, insomnia: On restoril prn, lexapro, xanax for flying only Thyroid nodule: Bx (non neoplastic goiter) 2014,  [place] 05-2016, next 2,3 years Pulmonary: --OSA, sleep study 2004, on a CPAP --Pulmonary emboli --2010 and 12-2013 - saw hematology 12-2014, rec anticoag x 2 years (until 12-2015), then xarelto decreased to 10 mg qd, now on 2.41m qd as off 10/2017 -- multiple pulmonary nodule, last CT 11-2013: No further imaging suggested --RAD, inhalers rarely Fibromyalgia CV: --MVP --  ectatic R carotid  artery, [place] 05-2016, next 2 years GI: --GERD, Esophageal stenosis --chronic dysphagia s/p  fundoplication 3  -- IBS -- chronic right flank,RUQ pain : since GB surgery 2008 Etiology not clear but likely multifactorial --> Adhesions from the surgery, neuropathy (DM), postherpetic neuralgia, scarring from PE, etc.  MRI T spine done 06-2014: DJD  Zoster --  2014 right flank   PLAN Syncope: No further events Head concussion :Improving, headache significantly decreased, no nausea or vomiting.  Recommend observation. Shoulder contusion: Resolved. HTN: Not on any medications, BP in the 130s/70.  Occasionally 142.  Will continue monitoring.   A summary  of my recommendations was sent to the patient via [place].  Follow Up Instructions: Keep the appointment 01/06/2019, call sooner if needed   I discussed the assessment and treatment plan with the patient. The patient was provided an opportunity to ask questions and all were answered. The patient agreed with the plan and demonstrated an understanding of the instructions.   The patient was advised to call back or seek an in-person evaluation if the symptoms worsen or if the condition fails to improve as anticipated.  I provided 14 minutes of non-face-to-face time during this encounter.   Kathlene November, MD

## 2018-07-22 ENCOUNTER — Inpatient Hospital Stay: Payer: Federal, State, Local not specified - PPO | Attending: Hematology & Oncology

## 2018-07-22 ENCOUNTER — Inpatient Hospital Stay: Payer: Federal, State, Local not specified - PPO | Admitting: Hematology & Oncology

## 2018-07-22 NOTE — Assessment & Plan Note (Signed)
Syncope: No further events Head concussion :Improving, headache significantly decreased, no nausea or vomiting.  Recommend observation. Shoulder contusion: Resolved. HTN: Not on any medications, BP in the 130s/70.  Occasionally 142.  Will continue monitoring.   A summary  of my recommendations was sent to the patient via [place].  Follow Up Instructions: Keep the appointment 01/06/2019, call sooner if needed

## 2018-08-03 ENCOUNTER — Other Ambulatory Visit: Payer: Self-pay | Admitting: Internal Medicine

## 2018-08-26 ENCOUNTER — Other Ambulatory Visit: Payer: Self-pay | Admitting: Internal Medicine

## 2018-08-30 ENCOUNTER — Other Ambulatory Visit: Payer: Self-pay | Admitting: Internal Medicine

## 2018-08-30 DIAGNOSIS — Z1231 Encounter for screening mammogram for malignant neoplasm of breast: Secondary | ICD-10-CM

## 2018-10-05 ENCOUNTER — Other Ambulatory Visit: Payer: Self-pay | Admitting: Hematology & Oncology

## 2018-10-21 DIAGNOSIS — H40023 Open angle with borderline findings, high risk, bilateral: Secondary | ICD-10-CM | POA: Diagnosis not present

## 2018-10-21 DIAGNOSIS — H04123 Dry eye syndrome of bilateral lacrimal glands: Secondary | ICD-10-CM | POA: Diagnosis not present

## 2018-10-23 ENCOUNTER — Other Ambulatory Visit: Payer: Self-pay

## 2018-10-23 ENCOUNTER — Ambulatory Visit: Payer: Medicare Other

## 2018-10-23 ENCOUNTER — Ambulatory Visit
Admission: RE | Admit: 2018-10-23 | Discharge: 2018-10-23 | Disposition: A | Discharge status: Home / Self Care | Payer: Federal, State, Local not specified - PPO | Source: Ambulatory Visit | Attending: Internal Medicine | Admitting: Internal Medicine

## 2018-10-23 DIAGNOSIS — Z1231 Encounter for screening mammogram for malignant neoplasm of breast: Secondary | ICD-10-CM

## 2018-10-29 ENCOUNTER — Other Ambulatory Visit: Payer: Self-pay

## 2018-10-29 DIAGNOSIS — I779 Disorder of arteries and arterioles, unspecified: Secondary | ICD-10-CM

## 2018-11-04 ENCOUNTER — Telehealth: Payer: Self-pay | Admitting: *Deleted

## 2018-11-04 NOTE — Telephone Encounter (Signed)
Call received from patient stating that she has been taking Xarelto 2.5 mg tablets since last appt with [name] in 07/2017.  She states that recent prescription sent in was written with instructions to take Xarelto 2.5 mg-two tablets daily.  Pt instructed per Dr. Antonieta Pert last note from 07/2017 that he instructed her to take 5mg  daily.  Jory Ee NP notified of above and order received for patient to continue Xarelto 2.5 mg as she has been doing until appt with [name] in August/2020.  Pt appreciative of assistance and has no further questions or concerns at this time.

## 2018-11-05 DIAGNOSIS — M25561 Pain in right knee: Secondary | ICD-10-CM | POA: Diagnosis not present

## 2018-11-05 DIAGNOSIS — Z4889 Encounter for other specified surgical aftercare: Secondary | ICD-10-CM | POA: Diagnosis not present

## 2018-11-13 ENCOUNTER — Other Ambulatory Visit: Payer: Self-pay

## 2018-11-13 ENCOUNTER — Ambulatory Visit (HOSPITAL_COMMUNITY)
Admission: RE | Admit: 2018-11-13 | Discharge: 2018-11-13 | Disposition: A | Discharge status: Home / Self Care | Payer: Medicare Other | Source: Ambulatory Visit | Attending: Cardiovascular Disease | Admitting: Cardiovascular Disease

## 2018-11-13 DIAGNOSIS — I739 Peripheral vascular disease, unspecified: Secondary | ICD-10-CM | POA: Diagnosis not present

## 2018-11-13 DIAGNOSIS — M1711 Unilateral primary osteoarthritis, right knee: Secondary | ICD-10-CM | POA: Diagnosis not present

## 2018-11-20 DIAGNOSIS — M25561 Pain in right knee: Secondary | ICD-10-CM | POA: Diagnosis not present

## 2018-11-23 ENCOUNTER — Telehealth: Payer: Self-pay | Admitting: Internal Medicine

## 2018-11-23 ENCOUNTER — Other Ambulatory Visit: Payer: Self-pay | Admitting: Internal Medicine

## 2018-11-25 NOTE — Telephone Encounter (Signed)
sent 

## 2018-11-25 NOTE — Telephone Encounter (Signed)
Temazepam refill.   Last OV: 06/21/2018 Last Fill: 06/24/2018 #30 and 4RF Pt sig: 1 capsule qhs prn UDS: 11/05/2017 Low risk

## 2018-12-02 ENCOUNTER — Encounter: Payer: Self-pay | Admitting: Hematology & Oncology

## 2018-12-02 ENCOUNTER — Inpatient Hospital Stay: Payer: Medicare Other

## 2018-12-02 ENCOUNTER — Inpatient Hospital Stay: Payer: Medicare Other | Attending: Hematology & Oncology | Admitting: Hematology & Oncology

## 2018-12-02 ENCOUNTER — Ambulatory Visit (HOSPITAL_BASED_OUTPATIENT_CLINIC_OR_DEPARTMENT_OTHER)
Admission: RE | Admit: 2018-12-02 | Discharge: 2018-12-02 | Disposition: A | Discharge status: Home / Self Care | Payer: Medicare Other | Source: Ambulatory Visit | Attending: Hematology & Oncology | Admitting: Hematology & Oncology

## 2018-12-02 ENCOUNTER — Other Ambulatory Visit: Payer: Self-pay

## 2018-12-02 VITALS — BP 145/78 | HR 67 | Temp 98.0°F | Resp 18 | Ht 68.0 in | Wt 260.8 lb

## 2018-12-02 DIAGNOSIS — Z86711 Personal history of pulmonary embolism: Secondary | ICD-10-CM | POA: Diagnosis not present

## 2018-12-02 DIAGNOSIS — Z79899 Other long term (current) drug therapy: Secondary | ICD-10-CM | POA: Insufficient documentation

## 2018-12-02 DIAGNOSIS — Z7901 Long term (current) use of anticoagulants: Secondary | ICD-10-CM | POA: Diagnosis not present

## 2018-12-02 DIAGNOSIS — I2690 Septic pulmonary embolism without acute cor pulmonale: Secondary | ICD-10-CM | POA: Diagnosis not present

## 2018-12-02 DIAGNOSIS — I2699 Other pulmonary embolism without acute cor pulmonale: Secondary | ICD-10-CM | POA: Insufficient documentation

## 2018-12-02 DIAGNOSIS — M79604 Pain in right leg: Secondary | ICD-10-CM | POA: Insufficient documentation

## 2018-12-02 DIAGNOSIS — M79661 Pain in right lower leg: Secondary | ICD-10-CM | POA: Diagnosis not present

## 2018-12-02 LAB — CMP (CANCER CENTER ONLY)
ALT: 19 U/L (ref 0–44)
AST: 18 U/L (ref 15–41)
Albumin: 4 g/dL (ref 3.5–5.0)
Alkaline Phosphatase: 65 U/L (ref 38–126)
Anion gap: 7 (ref 5–15)
BUN: 20 mg/dL (ref 8–23)
CO2: 32 mmol/L (ref 22–32)
Calcium: 8.7 mg/dL — ABNORMAL LOW (ref 8.9–10.3)
Chloride: 101 mmol/L (ref 98–111)
Creatinine: 0.92 mg/dL (ref 0.44–1.00)
GFR, Est AFR Am: >60 mL/min (ref >60)
GFR, Estimated: >60 mL/min (ref >60)
Glucose, Bld: 82 mg/dL (ref 70–99)
Potassium: 3.8 mmol/L (ref 3.5–5.1)
Sodium: 140 mmol/L (ref 135–145)
Total Bilirubin: 0.4 mg/dL (ref 0.3–1.2)
Total Protein: 7.1 g/dL (ref 6.5–8.1)

## 2018-12-02 LAB — CBC WITH DIFFERENTIAL (CANCER CENTER ONLY)
Abs Immature Granulocytes: 0.02 10*3/uL (ref 0.00–0.07)
Basophils Absolute: 0 10*3/uL (ref 0.0–0.1)
Basophils Relative: 1 %
Eosinophils Absolute: 0.1 10*3/uL (ref 0.0–0.5)
Eosinophils Relative: 1 %
HCT: 38.8 % (ref 36.0–46.0)
Hemoglobin: 12.5 g/dL (ref 12.0–15.0)
Immature Granulocytes: 0 %
Lymphocytes Relative: 37 %
Lymphs Abs: 2.2 10*3/uL (ref 0.7–4.0)
MCH: 28.3 pg (ref 26.0–34.0)
MCHC: 32.2 g/dL (ref 30.0–36.0)
MCV: 87.8 fL (ref 80.0–100.0)
Monocytes Absolute: 0.7 10*3/uL (ref 0.1–1.0)
Monocytes Relative: 12 %
Neutro Abs: 2.8 10*3/uL (ref 1.7–7.7)
Neutrophils Relative %: 49 %
Platelet Count: 268 10*3/uL (ref 150–400)
RBC: 4.42 MIL/uL (ref 3.87–5.11)
RDW: 15.7 % — ABNORMAL HIGH (ref 11.5–15.5)
WBC Count: 5.8 10*3/uL (ref 4.0–10.5)
nRBC: 0 % (ref 0.0–0.2)

## 2018-12-02 MED ORDER — XARELTO 2.5 MG PO TABS: 5.0000 mg | ORAL_TABLET | Freq: Every day | ORAL | 5 refills | Status: DC

## 2018-12-02 NOTE — Progress Notes (Signed)
Hematology and Oncology Follow Up Visit  Jennifer [name] [id number] [birth year] 68 y.o. 12/02/2018   Principle Diagnosis:  Recurrent pulmonary embolism  Current Therapy:   Xarelto 5 mg po q day   Interim History:  Jennifer [name] is here today for a long awaited follow-up.  Last saw her back in April 2019.  There is confusion about the Xarelto.  Should be taking 5 mg daily.  Unfortunately, the company does not make a 5 mg pill.  As such, she is to take two 2.5 mg pills at the same time daily.  She is also complaining about pain in the right leg.  She had arthroscopic knee surgery I think back in October 2019.  There is still pain in the leg.  I will have to get a Doppler on her leg.  She is been asked to physical therapy.  I want to make sure that there is no blood clot in that leg before she goes to physical therapy.  Thankfully, radiology not building is do the Doppler today.  Otherwise she is doing okay.  She has had no problems with cough.  There is no chest wall pain.  She is watching out for the coronavirus carefully.  She has had no issues with nausea or vomiting.  There is no bleeding.  There is no change in bowel or bladder habits.  Overall, her performance status is ECOG 1.  Medications:  Allergies as of 12/02/2018      Reactions   Morphine And Related Anaphylaxis, Shortness Of Breath   Promethazine Hcl Other (See Comments)   Hallucinations   Sulfonamide Derivatives Hives      Medication List       Accurate as of December 02, 2018  5:43 PM. If you have any questions, ask your nurse or doctor.        acetaminophen 500 MG tablet Commonly known as: TYLENOL Take 1 tablet (500 mg total) by mouth every 6 (six) hours as needed. What changed: reasons to take this   Aciphex 20 MG tablet Generic drug: RABEprazole Take 1 tablet by mouth twice daily   ALPRAZolam 0.5 MG tablet Commonly known as: XANAX TAKE 1 TABLET BY MOUTH ONCE DAILY AS NEEDED FOR ANXIETY What changed: See the  new instructions.   bacitracin ointment Apply 1 application topically 2 (two) times daily.   cycloSPORINE 0.05 % ophthalmic emulsion Commonly known as: RESTASIS Place 1 drop into both eyes 2 (two) times daily.   escitalopram 10 MG tablet Commonly known as: LEXAPRO TAKE 1 TABLET BY MOUTH ONCE DAILY   glucose blood test strip Check blood sugar once daily   hydrocortisone valerate cream 0.2 % Commonly known as: WESTCORT as needed.   hyoscyamine 0.125 MG SL tablet Commonly known as: LEVSIN SL DISSOLVE ONE TABLET IN MOUTH EVERY 4 HOURS AS NEEDED What changed:   how much to take  how to take this  when to take this  reasons to take this  additional instructions   OneTouch Delica Lancets 92E Misc Check blood sugar once daily   temazepam 30 MG capsule Commonly known as: RESTORIL TAKE 1 CAPSULE BY MOUTH AT BEDTIME AS NEEDED FOR SLEEP   Xarelto 2.5 MG Tabs tablet Generic drug: rivaroxaban Take 2 tablets (5 mg total) by mouth daily. What changed: how much to take Changed by: Jennifer Napoleon, MD       Allergies:  Allergies  Allergen Reactions  . Morphine And Related Anaphylaxis and Shortness Of Breath  .  Promethazine Hcl Other (See Comments)    Hallucinations  . Sulfonamide Derivatives Hives    Past Medical History, Surgical history, Social history, and Family History were reviewed and updated.  Review of Systems: Review of Systems  Constitutional: Negative.   HENT: Negative.   Eyes: Negative.   Respiratory: Negative.   Cardiovascular: Negative.   Gastrointestinal: Negative.   Genitourinary: Negative.   Musculoskeletal: Negative.   Skin: Negative.   Neurological: Negative.   Endo/Heme/Allergies: Negative.   Psychiatric/Behavioral: Negative.      Physical Exam:  height is 5\' 8"  (1.727 m) and weight is 260 lb 12.8 oz (118.3 kg). Her temporal temperature is 98 F (36.7 C). Her blood pressure is 145/78 (abnormal) and her pulse is 67. Her respiration is  18 and oxygen saturation is 98%.   Wt Readings from Last 3 Encounters:  12/02/18 260 lb 12.8 oz (118.3 kg)  06/21/18 261 lb 8 oz (118.6 kg)  06/20/18 260 lb (117.9 kg)    Physical Exam Vitals signs reviewed.  HENT:     Head: Normocephalic and atraumatic.  Eyes:     Pupils: Pupils are equal, round, and reactive to light.  Neck:     Musculoskeletal: Normal range of motion.  Cardiovascular:     Rate and Rhythm: Normal rate and regular rhythm.     Heart sounds: Normal heart sounds.  Pulmonary:     Effort: Pulmonary effort is normal.     Breath sounds: Normal breath sounds.  Abdominal:     General: Bowel sounds are normal.     Palpations: Abdomen is soft.  Musculoskeletal: Normal range of motion.        General: No tenderness or deformity.  Lymphadenopathy:     Cervical: No cervical adenopathy.  Skin:    General: Skin is warm and dry.     Findings: No erythema or rash.  Neurological:     Mental Status: She is alert and oriented to person, place, and time.  Psychiatric:        Behavior: Behavior normal.        Thought Content: Thought content normal.        Judgment: Judgment normal.      Lab Results  Component Value Date   WBC 5.8 12/02/2018   HGB 12.5 12/02/2018   HCT 38.8 12/02/2018   MCV 87.8 12/02/2018   PLT 268 12/02/2018   Lab Results  Component Value Date   FERRITIN 165 08/13/2017   IRON 60 08/13/2017   TIBC 260 08/13/2017   UIBC 200 08/13/2017   IRONPCTSAT 23 08/13/2017   Lab Results  Component Value Date   RBC 4.42 12/02/2018   No results found for: KPAFRELGTCHN, LAMBDASER, KAPLAMBRATIO No results found for: IGGSERUM, IGA, IGMSERUM No results found for: [name], SPEI   Chemistry      Component Value Date/Time   NA 140 12/02/2018 1506   NA 143 02/16/2017 1450   NA 142 01/19/2016 1141   K 3.8 12/02/2018 1506   K 3.7 02/16/2017 1450   K 3.7 01/19/2016 1141   CL 101 12/02/2018 1506    CL 105 02/16/2017 1450   CO2 32 12/02/2018 1506   CO2 32 02/16/2017 1450   CO2 27 01/19/2016 1141   BUN 20 12/02/2018 1506   BUN 14 02/16/2017 1450   BUN 19.5 01/19/2016 1141   CREATININE 0.92 12/02/2018 1506   CREATININE 1.2 02/16/2017 1450   CREATININE 0.9 01/19/2016 1141  Component Value Date/Time   CALCIUM 8.7 (L) 12/02/2018 1506   CALCIUM 8.8 02/16/2017 1450   CALCIUM 8.8 01/19/2016 1141   ALKPHOS 65 12/02/2018 1506   ALKPHOS 57 02/16/2017 1450   ALKPHOS 59 01/19/2016 1141   AST 18 12/02/2018 1506   AST 21 01/19/2016 1141   ALT 19 12/02/2018 1506   ALT 27 02/16/2017 1450   ALT 18 01/19/2016 1141   BILITOT 0.4 12/02/2018 1506   BILITOT 0.31 01/19/2016 1141      Impression and Plan: Ms. Mines is a very pleasant 68 yo African American female with history of recurrent PE. Her initial thrombus was diagnosed with her initial thrombus in 2012 and then recurred in 2015.   Again, she wanted to be on life long anticoagulation.  I think 5 mg daily would be reasonable.  We will see what the Doppler study shows.  I really do want to get her back for follow-up.  I do need to follow-up on the lupus anticoagulant test.  I spent about 30 minutes with her today.  We have not seen her in a while.  She has no issues that have to be addressed.   Jennifer Napoleon, MD 8/10/20205:43 PM

## 2018-12-03 ENCOUNTER — Encounter: Payer: Self-pay | Admitting: *Deleted

## 2018-12-03 DIAGNOSIS — M25661 Stiffness of right knee, not elsewhere classified: Secondary | ICD-10-CM | POA: Diagnosis not present

## 2018-12-04 ENCOUNTER — Other Ambulatory Visit: Payer: Self-pay | Admitting: Hematology & Oncology

## 2018-12-04 NOTE — Telephone Encounter (Signed)
Previous Therapist, [employer] by [employer], RN reviewed with pt via phone. Pt verbalizes understanding of medication administration using teach back. dph

## 2018-12-06 DIAGNOSIS — M25661 Stiffness of right knee, not elsewhere classified: Secondary | ICD-10-CM | POA: Diagnosis not present

## 2018-12-09 DIAGNOSIS — M25661 Stiffness of right knee, not elsewhere classified: Secondary | ICD-10-CM | POA: Diagnosis not present

## 2018-12-12 DIAGNOSIS — M25661 Stiffness of right knee, not elsewhere classified: Secondary | ICD-10-CM | POA: Diagnosis not present

## 2018-12-16 DIAGNOSIS — M25661 Stiffness of right knee, not elsewhere classified: Secondary | ICD-10-CM | POA: Diagnosis not present

## 2018-12-17 ENCOUNTER — Other Ambulatory Visit: Payer: Self-pay | Admitting: Internal Medicine

## 2018-12-31 ENCOUNTER — Other Ambulatory Visit: Payer: Self-pay | Admitting: Hematology & Oncology

## 2019-01-03 ENCOUNTER — Other Ambulatory Visit: Payer: Self-pay

## 2019-01-03 NOTE — Progress Notes (Signed)
Subjective:   Jennifer [name] is a 68 y.o. female who presents for an Initial Medicare Annual Wellness Visit.  Review of Systems     Home Safety/Smoke Alarms: Feels safe in home. Smoke alarms in place.  Lives with husband in 1 story home.  Step over tub w/ grab bar.    Female:       Mammo- 10/24/18      Dexa scan-   05/07/17     [facility]- 12/14/15. [facility]. UTD per pt.    Objective:    Today's Vitals   01/06/19 1308  BP: 126/78  Pulse: 69  Temp: (!) 96.4 F (35.8 C)  TempSrc: Temporal  SpO2: 98%  Weight: 264 lb 9.6 oz (120 kg)  Height: 5\' 8"  (1.727 m)   Body mass index is 40.23 kg/m.  Advanced Directives 01/06/2019 12/02/2018 08/13/2017 06/18/2017 02/26/2017 11/02/2016 09/28/2016  Does Patient Have a Medical Advance Directive? No No No No No No No  Copy of Healthcare Power of Attorney in Chart? - - - - - - -  Would patient like information on creating a medical advance directive? No - Patient declined No - Patient declined - No - Patient declined No - Patient declined No - Patient declined -  Pre-existing out of facility DNR order (yellow form or pink MOST form) - - - - - - -    Current Medications (verified) Outpatient Encounter Medications as of 01/06/2019  Medication Sig  . acetaminophen (TYLENOL) 500 MG tablet Take 1 tablet (500 mg total) by mouth every 6 (six) hours as needed. (Patient taking differently: Take 500 mg by mouth every 6 (six) hours as needed for mild pain or fever. )  . ACIPHEX 20 MG tablet Take 1 tablet by mouth twice daily  . ALPRAZolam (XANAX) 0.5 MG tablet TAKE 1 TABLET BY MOUTH ONCE DAILY AS NEEDED FOR ANXIETY (Patient taking differently: Take 0.5 mg by mouth at bedtime as needed for anxiety. )  . bacitracin ointment Apply 1 application topically 2 (two) times daily.  . cycloSPORINE (RESTASIS) 0.05 % ophthalmic emulsion Place 1 drop into both eyes 2 (two) times daily.   Marland Kitchen escitalopram (LEXAPRO) 10 MG tablet Take 1 tablet (10 mg total) by  mouth daily.  Marland Kitchen glucose blood test strip Check blood sugar once daily  . hydrocortisone valerate cream (WESTCORT) 0.2 % as needed.  . hyoscyamine (LEVSIN SL) 0.125 MG SL tablet DISSOLVE ONE TABLET IN MOUTH EVERY 4 HOURS AS NEEDED (Patient taking differently: Take 0.125 mg by mouth every 4 (four) hours as needed for cramping. )  . ONETOUCH DELICA LANCETS 99991111 MISC Check blood sugar once daily  . temazepam (RESTORIL) 30 MG capsule TAKE 1 CAPSULE BY MOUTH AT BEDTIME AS NEEDED FOR SLEEP  . XARELTO 2.5 MG TABS tablet Take 2 tablets by mouth once daily  . [DISCONTINUED] ACIPHEX 20 MG tablet Take 1 tablet by mouth twice daily   No facility-administered encounter medications on file as of 01/06/2019.     Allergies (verified) Morphine and related, Promethazine hcl, and Sulfonamide derivatives   History: Past Medical History:  Diagnosis Date  . Anemia   . Anxiety   . Chronic chest pain   . Chronic fatigue 02/03/2017  . Colon polyps    inflamed partially ulcerated hyperplastic polyp  . Diabetes ([place]) 09/24/2013  . Diverticulosis   . Dysphagia esophageal phase - chronic after 3 fundoplications [id number]  . Esophageal stenosis    Stricture after fundoplication  REDO  . GERD (gastroesophageal reflux disease)   . Hemorrhoids, internal, with bleeding 06/27/2017  . Hypertension   . IBS (irritable bowel syndrome)   . Mitral valve prolapse   . OSA (obstructive sleep apnea)    on CPAP-Mild - Sleep study 2004  . Pulmonary embolus ([place])    2010 and 12-2013  . Shortness of breath    with exertion  . Sleep apnea    wears CPAP  . Tinnitus    Subjective  . Zoster 2014   R flank   Past Surgical History:  Procedure Laterality Date  . ABDOMINAL HYSTERECTOMY     still has 1 ovary   . APPENDECTOMY    . [place]   Stem surgery, Arnold Chiari Malformation  . BREAST REDUCTION SURGERY    . CATARACT EXTRACTION Bilateral 2017   cataracts, Dr. Herbert Deaner  . CHOLECYSTECTOMY  2008  . HEMORRHOID  BANDING    . HERNIA REPAIR     Hital Hernia  . KNEE ARTHROSCOPY  05/16/2012   Procedure: ARTHROSCOPY KNEE;  Surgeon: Sharmon Revere, MD;  Location: [place];  Service: Orthopedics;  Laterality: Left;  . KNEE ARTHROSCOPY Right 01/2018  . NISSEN FUNDOPLICATION     X 3 lab, open x 2 last with mesh  . REDUCTION MAMMAPLASTY Bilateral 2007   Family History  Problem Relation Age of Onset  . Diabetes Mother   . Rheum arthritis Mother   . Hypertension Mother   . Hypertension Father   . Rheum arthritis Sister   . Hypertension Sister   . Colon cancer Maternal Grandmother   . Rheum arthritis Sister   . Rheum arthritis Sister   . Rheum arthritis Sister   . CAD Sister        MIdx age 4  . Stomach cancer Maternal Uncle   . Breast cancer Neg Hx   . Esophageal cancer Neg Hx   . Rectal cancer Neg Hx    Social History   Socioeconomic History  . Marital status: Married    Spouse name: Not on file  . Number of children: 1  . Years of education: Not on file  . Highest education level: Not on file  Occupational History  . Occupation: retired form the [employer] 04-2015    Employer: [employer]  Social Needs  . Financial resource strain: Not on file  . Food insecurity    Worry: Not on file    Inability: Not on file  . Transportation needs    Medical: Not on file    Non-medical: Not on file  Tobacco Use  . Smoking status: Never Smoker  . Smokeless tobacco: Never Used  . Tobacco comment: never used tobacco  Substance and Sexual Activity  . Alcohol use: No    Alcohol/week: 0.0 standard drinks  . Drug use: No  . Sexual activity: Yes    Birth control/protection: None  Lifestyle  . Physical activity    Days per week: Not on file    Minutes per session: Not on file  . Stress: Not on file  Relationships  . Social Herbalist on phone: Not on file    Gets together: Not on file    Attends religious service: Not on file    Active member of club or organization: Not on file     Attends meetings of clubs or organizations: Not on file    Relationship status: Not on file  Other Topics Concern  .  Not on file  Social History Narrative   [employer] Graduate - Technical school   Married 15-Aug-2068 for 2 years, divorced; re-married 15-Aug-1984 for 2.5 years, divorced; re-married Aug 15, 1988 for 1 year, divorced; re-married '00   1 son - 08-16-70   Sister - Died @ 13 [place]        Tobacco Counseling Counseling given: Not Answered Comment: never used tobacco   Clinical Intake: Pain : No/denies pain   Activities of Daily Living In your present state of health, do you have any difficulty performing the following activities: 01/06/2019 05/07/2018  Hearing? N N  Vision? N N  Difficulty concentrating or making decisions? N N  Walking or climbing stairs? N N  Dressing or bathing? N N  Doing errands, shopping? N N  Preparing Food and eating ? N -  Using the Toilet? N -  In the past six months, have you accidently leaked urine? N -  Do you have problems with loss of bowel control? N -  Managing your Medications? N -  Managing your Finances? N -  Housekeeping or managing your Housekeeping? N -  Some recent data might be hidden     Immunizations and Health Maintenance Immunization History  Administered Date(s) Administered  . Influenza Split 01/17/2011, 01/08/2012  . Influenza Whole 01/15/2008, 01/21/2009, 01/25/2010  . Influenza, High Dose Seasonal PF 01/28/2018  . Influenza,inj,Quad PF,6+ Mos 12/26/2012, 01/07/2014, 01/04/2015, 01/11/2016, 02/16/2017  . Influenza,inj,quad, With Preservative 01/22/2017  . Influenza-Unspecified 12/06/2018  . Pneumococcal Conjugate-13 11/19/2015, 12/06/2018  . Pneumococcal Polysaccharide-23 04/29/2014  . Tdap 08/23/2012  . Zoster 10/03/2012  . Zoster Recombinat (Shingrix) 05/23/2018, 10/30/2018   Health Maintenance Due  Topic Date Due  . FOOT EXAM  05/23/2017  . HEMOGLOBIN A1C  11/05/2018    Patient Care Team: Colon Branch, MD as PCP - General  (Internal Medicine) Marin Olp Rudell Cobb, MD as Consulting Physician (Oncology) Gatha Mayer, MD as Consulting Physician (Gastroenterology) Susa Day, MD as Consulting Physician (Orthopedic Surgery) Monna Fam, MD as Consulting Physician (Ophthalmology)  Indicate any recent Medical Services you may have received from other than [facility] providers in the past year (date may be approximate).     Assessment:   This is a routine wellness examination for K[place]. Physical assessment deferred to PCP.   Hearing/Vision screen  Hearing Screening   125Hz  250Hz  500Hz  1000Hz  2000Hz  3000Hz  4000Hz  6000Hz  8000Hz   Right ear:           Left ear:           Comments: Passed whisper   Visual Acuity Screening   Right eye Left eye Both eyes  Without correction: 20/20 20/20 20/20   With correction:       Dietary issues and exercise activities discussed: Current Exercise Habits: The patient does not participate in regular exercise at present, Exercise limited by: None identified Diet (meal preparation, eat out, water intake, caffeinated beverages, dairy products, fruits and vegetables): 24 hr recall Breakfast: premium protein Lunch: skip Dinner:  Pork chop and rice 2 cookies  Goals    . Increase physical activity      Depression Screen PHQ 2/9 Scores 01/06/2019 05/07/2018 11/22/2016 11/01/2015 11/24/2014  PHQ - 2 Score 0 0 0 0 0  PHQ- 9 Score - 3 - - -    Fall Risk Fall Risk  01/06/2019 05/07/2018 11/22/2016 01/19/2016 11/01/2015  Falls in the past year? 0 1 No No No  Number falls in past yr: - 0 - - -  Injury with Fall? - 1 - - -  Follow up - - - - -    Cognitive Function: Ad8 score reviewed for issues:  Issues making decisions:no  Less interest in hobbies / activities:no  Repeats questions, stories (family complaining):no  Trouble using ordinary gadgets (microwave, computer, phone):no  Forgets the month or year: no  Mismanaging finances: no  Remembering appts:no  Daily problems with  thinking and/or memory:no Ad8 score is=0       Screening Tests Health Maintenance  Topic Date Due  . FOOT EXAM  05/23/2017  . HEMOGLOBIN A1C  11/05/2018  . URINE MICROALBUMIN  05/08/2019  . OPHTHALMOLOGY EXAM  05/24/2019  . MAMMOGRAM  10/23/2019  . PNA vac Low Risk Adult (2 of 2 - PPSV23) 12/06/2019  . COLONOSCOPY  12/13/2020  . TETANUS/TDAP  08/24/2022  . INFLUENZA VACCINE  Completed  . DEXA SCAN  Completed  . Hepatitis C Screening  Completed     Plan:   See you next year!  Continue to eat heart healthy diet (full of fruits, vegetables, whole grains, lean protein, water--limit salt, fat, and sugar intake) and increase physical activity as tolerated.  Continue doing brain stimulating activities (puzzles, reading, adult coloring books, staying active) to keep memory sharp.     I have personally reviewed and noted the following in the patient's chart:   . Medical and social history . Use of alcohol, tobacco or illicit drugs  . Current medications and supplements . Functional ability and status . Nutritional status . Physical activity . Advanced directives . List of other physicians . Hospitalizations, surgeries, and ER visits in previous 12 months . Vitals . Screenings to include cognitive, depression, and falls . Referrals and appointments  In addition, I have reviewed and discussed with patient certain preventive protocols, quality metrics, and best practice recommendations. A written personalized care plan for preventive services as well as general preventive health recommendations were provided to patient.     Shela Nevin, [place]   01/06/2019

## 2019-01-06 ENCOUNTER — Ambulatory Visit (INDEPENDENT_AMBULATORY_CARE_PROVIDER_SITE_OTHER): Payer: Medicare Other | Admitting: Internal Medicine

## 2019-01-06 ENCOUNTER — Other Ambulatory Visit: Payer: Self-pay

## 2019-01-06 ENCOUNTER — Other Ambulatory Visit: Payer: Self-pay | Admitting: Internal Medicine

## 2019-01-06 ENCOUNTER — Ambulatory Visit (INDEPENDENT_AMBULATORY_CARE_PROVIDER_SITE_OTHER): Payer: Medicare Other | Admitting: *Deleted

## 2019-01-06 ENCOUNTER — Encounter: Payer: Self-pay | Admitting: Internal Medicine

## 2019-01-06 ENCOUNTER — Encounter: Payer: Self-pay | Admitting: *Deleted

## 2019-01-06 VITALS — BP 126/78 | HR 69 | Temp 96.4°F | Ht 68.0 in | Wt 264.6 lb

## 2019-01-06 DIAGNOSIS — E119 Type 2 diabetes mellitus without complications: Secondary | ICD-10-CM

## 2019-01-06 DIAGNOSIS — Z0000 Encounter for general adult medical examination without abnormal findings: Secondary | ICD-10-CM | POA: Diagnosis not present

## 2019-01-06 DIAGNOSIS — F419 Anxiety disorder, unspecified: Secondary | ICD-10-CM

## 2019-01-06 DIAGNOSIS — M25561 Pain in right knee: Secondary | ICD-10-CM | POA: Diagnosis not present

## 2019-01-06 DIAGNOSIS — G4730 Sleep apnea, unspecified: Secondary | ICD-10-CM | POA: Diagnosis not present

## 2019-01-06 DIAGNOSIS — G4710 Hypersomnia, unspecified: Secondary | ICD-10-CM

## 2019-01-06 DIAGNOSIS — I10 Essential (primary) hypertension: Secondary | ICD-10-CM | POA: Diagnosis not present

## 2019-01-06 DIAGNOSIS — Z79899 Other long term (current) drug therapy: Secondary | ICD-10-CM

## 2019-01-06 MED ORDER — TRIAMTERENE-HCTZ 37.5-25 MG PO TABS: 0.5000 | ORAL_TABLET | Freq: Every day | ORAL | 1 refills | Status: DC

## 2019-01-06 NOTE — Progress Notes (Signed)
Subjective:    Patient ID: Jennifer [name], female    DOB: 03/24/1951, 68 y.o.   MRN: [id number]  DOS:  01/06/2019 Type of visit - description: Routine office visit HTN: Ambulatory BPs were getting a little higher, self restarted diuretics, BPs are better now. Having problems with right knee pain, needs a second opinion. DM: Ambulatory CBGs 104.  Due for A1c    Review of Systems Denies lower extremity paresthesias. Ongoing right knee pain.  Past Medical History:  Diagnosis Date  . Anemia   . Anxiety   . Chronic chest pain   . Chronic fatigue 02/03/2017  . Colon polyps    inflamed partially ulcerated hyperplastic polyp  . Diabetes ([place]) 09/24/2013  . Diverticulosis   . Dysphagia esophageal phase - chronic after 3 fundoplications [id number]  . Esophageal stenosis    Stricture after fundoplication REDO  . GERD (gastroesophageal reflux disease)   . Hemorrhoids, internal, with bleeding 06/27/2017  . Hypertension   . IBS (irritable bowel syndrome)   . Mitral valve prolapse   . OSA (obstructive sleep apnea)    on CPAP-Mild - Sleep study 2004  . Pulmonary embolus ([place])    2010 and 12-2013  . Shortness of breath    with exertion  . Sleep apnea    wears CPAP  . Tinnitus    Subjective  . Zoster 2014   R flank    Past Surgical History:  Procedure Laterality Date  . ABDOMINAL HYSTERECTOMY     still has 1 ovary   . APPENDECTOMY    . [place]   Stem surgery, Arnold Chiari Malformation  . BREAST REDUCTION SURGERY    . CATARACT EXTRACTION Bilateral 2017   cataracts, Dr. Herbert Deaner  . CHOLECYSTECTOMY  2008  . HEMORRHOID BANDING    . HERNIA REPAIR     Hital Hernia  . KNEE ARTHROSCOPY  05/16/2012   Procedure: ARTHROSCOPY KNEE;  Surgeon: Sharmon Revere, MD;  Location: [facility];  Service: Orthopedics;  Laterality: Left;  . KNEE ARTHROSCOPY Right 01/2018  . NISSEN FUNDOPLICATION     X 3 lab, open x 2 last with mesh  . REDUCTION MAMMAPLASTY Bilateral 2007    Social History    Socioeconomic History  . Marital status: Married    Spouse name: Not on file  . Number of children: 1  . Years of education: Not on file  . Highest education level: Not on file  Occupational History  . Occupation: retired form the [employer] 04-2015    Employer: [employer]  Social Needs  . Financial resource strain: Not on file  . Food insecurity    Worry: Not on file    Inability: Not on file  . Transportation needs    Medical: Not on file    Non-medical: Not on file  Tobacco Use  . Smoking status: Never Smoker  . Smokeless tobacco: Never Used  . Tobacco comment: never used tobacco  Substance and Sexual Activity  . Alcohol use: No    Alcohol/week: 0.0 standard drinks  . Drug use: No  . Sexual activity: Yes    Birth control/protection: None  Lifestyle  . Physical activity    Days per week: Not on file    Minutes per session: Not on file  . Stress: Not on file  Relationships  . Social Herbalist on phone: Not on file    Gets together: Not on file    Attends religious  service: Not on file    Active member of club or organization: Not on file    Attends meetings of clubs or organizations: Not on file    Relationship status: Not on file  . Intimate partner violence    Fear of current or ex partner: Not on file    Emotionally abused: Not on file    Physically abused: Not on file    Forced sexual activity: Not on file  Other Topics Concern  . Not on file  Social History Narrative   [employer] Graduate - Technical school   Married 07/25/68 for 2 years, divorced; re-married 07/25/84 for 2.5 years, divorced; re-married 1988/07/25 for 1 year, divorced; re-married '00   1 son - July 26, 2070   Sister - Died @ 50 [place]          Allergies as of 01/06/2019      Reactions   Morphine And Related Anaphylaxis, Shortness Of Breath   Promethazine Hcl Other (See Comments)   Hallucinations   Sulfonamide Derivatives Hives      Medication List       Accurate as of January 06, 2019 11:59  PM. If you have any questions, ask your nurse or doctor.        acetaminophen 500 MG tablet Commonly known as: TYLENOL Take 1 tablet (500 mg total) by mouth every 6 (six) hours as needed. What changed: reasons to take this   Aciphex 20 MG tablet Generic drug: RABEprazole Take 1 tablet by mouth twice daily   ALPRAZolam 0.5 MG tablet Commonly known as: XANAX TAKE 1 TABLET BY MOUTH ONCE DAILY AS NEEDED FOR ANXIETY What changed: See the new instructions.   bacitracin ointment Apply 1 application topically 2 (two) times daily.   cycloSPORINE 0.05 % ophthalmic emulsion Commonly known as: RESTASIS Place 1 drop into both eyes 2 (two) times daily.   escitalopram 10 MG tablet Commonly known as: LEXAPRO Take 1 tablet (10 mg total) by mouth daily.   glucose blood test strip Check blood sugar once daily   hydrocortisone valerate cream 0.2 % Commonly known as: WESTCORT as needed.   hyoscyamine 0.125 MG SL tablet Commonly known as: LEVSIN SL DISSOLVE ONE TABLET IN MOUTH EVERY 4 HOURS AS NEEDED What changed:   how much to take  how to take this  when to take this  reasons to take this  additional instructions   OneTouch Delica Lancets 99991111 Misc Check blood sugar once daily   temazepam 30 MG capsule Commonly known as: RESTORIL TAKE 1 CAPSULE BY MOUTH AT BEDTIME AS NEEDED FOR SLEEP   triamterene-hydrochlorothiazide 37.5-25 MG tablet Commonly known as: MAXZIDE-25 Take 0.5 tablets by mouth daily.   Xarelto 2.5 MG Tabs tablet Generic drug: rivaroxaban Take 2 tablets by mouth once daily           Objective:   Physical Exam BP 126/78 (BP Location: Left Arm, Patient Position: Sitting, Cuff Size: Normal)   Pulse 69   Temp (!) 96.4 F (35.8 C) (Temporal)   Ht 5\' 8"  (1.727 m)   Wt 264 lb 9 oz (120 kg)   SpO2 98%   BMI 40.23 kg/m  General:   Well developed, NAD, BMI noted. HEENT:  Normocephalic . Face symmetric, atraumatic Lungs:  CTA B Normal respiratory  effort, no intercostal retractions, no accessory muscle use. Heart: RRR,  no murmur.  No pretibial edema bilaterally  DM foot exam: No edema, good pedal pulses, pinprick examination normal Neurologic:  alert &  oriented X3.  Speech normal, gait appropriate for age and unassisted Psych--  Cognition and judgment appear intact.  Cooperative with normal attention span and concentration.  Behavior appropriate. No anxious or depressed appearing.      Assessment      Assessment   DM, + neuropathy. Foot exam 04-2016 HTN Anxiety, insomnia: On restoril prn, lexapro, xanax for flying only Thyroid nodule: Bx (non neoplastic goiter) 2014,  [place] 05-2016, next 2,3 years Pulmonary: --OSA, sleep study 2004, on a CPAP --Pulmonary emboli --2010 and 12-2013 - saw hematology 12-2014, rec anticoag x 2 years (until 12-2015), then xarelto decreased to 10 mg qd, now on 2.5 mg qd as off 10/2017 -- multiple pulmonary nodule, last CT 11-2013: No further imaging suggested --RAD, inhalers rarely Fibromyalgia CV: --MVP -- carotid US 1-39%s GI: --GERD, Esophageal stenosis --chronic dysphagia s/p  fundoplication 3  -- IBS -- chronic right flank,RUQ pain : since GB surgery 2008 Etiology not clear but likely multifactorial --> Adhesions from the surgery, neuropathy (DM), postherpetic neuralgia, scarring from PE, etc.  MRI T spine done 06-2014: DJD  Zoster --  2014 right flank  PLAN DM: Diet control, check A1c and micro.  Feet exam negative. HTN: BPs were in the 145, 150, self restart Maxide, half tablet daily.  BPs are now in the 120s.  Check a BMP Right knee pain: Seen by Dr. Tonita Cong, recent MRI was done, she was offered a TKR, Pt  Asking a referral to Dr. Maureen Ralphs for 2nd opinion.  Will arrange Preventive care: Had a flu shot already RTC 04-2019 CPX

## 2019-01-06 NOTE — Progress Notes (Signed)
Pre visit review using our clinic review tool, if applicable. No additional management support is needed unless otherwise documented below in the visit note. 

## 2019-01-06 NOTE — Patient Instructions (Addendum)
GO TO THE LAB : Get the blood work     GO TO THE FRONT DESK Schedule your next appointment   for physical exam 04-2019   Check the  blood pressure 2 or 3 times a month BP GOAL is between 110/65 and  135/85. If it is consistently higher or lower, let me know

## 2019-01-06 NOTE — Patient Instructions (Signed)
See you next year!  Continue to eat heart healthy diet (full of fruits, vegetables, whole grains, lean protein, water--limit salt, fat, and sugar intake) and increase physical activity as tolerated.  Continue doing brain stimulating activities (puzzles, reading, adult coloring books, staying active) to keep memory sharp.    Jennifer [name] , Thank you for taking time to come for your Medicare Wellness Visit. I appreciate your ongoing commitment to your health goals. Please review the following plan we discussed and let me know if I can assist you in the future.   These are the goals we discussed: Goals    . Increase physical activity       This is a list of the screening recommended for you and due dates:  Health Maintenance  Topic Date Due  . Complete foot exam   05/23/2017  . Hemoglobin A1C  11/05/2018  . Urine Protein Check  05/08/2019  . Eye exam for diabetics  05/24/2019  . Mammogram  10/23/2019  . Pneumonia vaccines (2 of 2 - PPSV23) 12/06/2019  . Colon Cancer Screening  12/13/2020  . Tetanus Vaccine  08/24/2022  . Flu Shot  Completed  . DEXA scan (bone density measurement)  Completed  .  Hepatitis C: One time screening is recommended by Center for Disease Control  (CDC) for  adults born from [birth year] through 1965.   Completed    Health Maintenance After Age 3 After age 30, you are at a higher risk for certain long-term diseases and infections as well as injuries from falls. Falls are a major cause of broken bones and head injuries in people who are older than age 28. Getting regular preventive care can help to keep you healthy and well. Preventive care includes getting regular testing and making lifestyle changes as recommended by your health care provider. Talk with your health care provider about:  Which screenings and tests you should have. A screening is a test that checks for a disease when you have no symptoms.  A diet and exercise plan that is right for you. What should I  know about screenings and tests to prevent falls? Screening and testing are the best ways to find a health problem early. Early diagnosis and treatment give you the best chance of managing medical conditions that are common after age 67. Certain conditions and lifestyle choices may make you more likely to have a fall. Your health care provider may recommend:  Regular vision checks. Poor vision and conditions such as cataracts can make you more likely to have a fall. If you wear glasses, make sure to get your prescription updated if your vision changes.  Medicine review. Work with your health care provider to regularly review all of the medicines you are taking, including over-the-counter medicines. Ask your health care provider about any side effects that may make you more likely to have a fall. Tell your health care provider if any medicines that you take make you feel dizzy or sleepy.  Osteoporosis screening. Osteoporosis is a condition that causes the bones to get weaker. This can make the bones weak and cause them to break more easily.  Blood pressure screening. Blood pressure changes and medicines to control blood pressure can make you feel dizzy.  Strength and balance checks. Your health care provider may recommend certain tests to check your strength and balance while standing, walking, or changing positions.  Foot health exam. Foot pain and numbness, as well as not wearing proper footwear, can make you more  likely to have a fall.  Depression screening. You may be more likely to have a fall if you have a fear of falling, feel emotionally low, or feel unable to do activities that you used to do.  Alcohol use screening. Using too much alcohol can affect your balance and may make you more likely to have a fall. What actions can I take to lower my risk of falls? General instructions  Talk with your health care provider about your risks for falling. Tell your health care provider if: ? You  fall. Be sure to tell your health care provider about all falls, even ones that seem minor. ? You feel dizzy, sleepy, or off-balance.  Take over-the-counter and prescription medicines only as told by your health care provider. These include any supplements.  Eat a healthy diet and maintain a healthy weight. A healthy diet includes low-fat dairy products, low-fat (lean) meats, and fiber from whole grains, beans, and lots of fruits and vegetables. Home safety  Remove any tripping hazards, such as rugs, cords, and clutter.  Install safety equipment such as grab bars in bathrooms and safety rails on stairs.  Keep rooms and walkways well-lit. Activity   Follow a regular exercise program to stay fit. This will help you maintain your balance. Ask your health care provider what types of exercise are appropriate for you.  If you need a cane or walker, use it as recommended by your health care provider.  Wear supportive shoes that have nonskid soles. Lifestyle  Do not drink alcohol if your health care provider tells you not to drink.  If you drink alcohol, limit how much you have: ? 0-1 drink a day for women. ? 0-2 drinks a day for men.  Be aware of how much alcohol is in your drink. In the [place] one drink equals one typical bottle of beer (12 oz), one-half glass of wine (5 oz), or one shot of hard liquor (1 oz).  Do not use any products that contain nicotine or tobacco, such as cigarettes and e-cigarettes. If you need help quitting, ask your health care provider. Summary  Having a healthy lifestyle and getting preventive care can help to protect your health and wellness after age 78.  Screening and testing are the best way to find a health problem early and help you avoid having a fall. Early diagnosis and treatment give you the best chance for managing medical conditions that are more common for people who are older than age 43.  Falls are a major cause of broken bones and head  injuries in people who are older than age 56. Take precautions to prevent a fall at home.  Work with your health care provider to learn what changes you can make to improve your health and wellness and to prevent falls. This information is not intended to replace advice given to you by your health care provider. Make sure you discuss any questions you have with your health care provider. Document Released: 02/21/2017 Document Revised: 08/01/2018 Document Reviewed: 02/21/2017 Elsevier Patient Education  2020 [employer].

## 2019-01-07 LAB — BASIC METABOLIC PANEL
BUN: 18 mg/dL (ref 6–23)
CO2: 31 mEq/L (ref 19–32)
Calcium: 8.9 mg/dL (ref 8.4–10.5)
Chloride: 102 mEq/L (ref 96–112)
Creatinine, Ser: 0.9 mg/dL (ref 0.40–1.20)
GFR: 75.79 mL/min (ref >60.00)
Glucose, Bld: 89 mg/dL (ref 70–99)
Potassium: 4 mEq/L (ref 3.5–5.1)
Sodium: 140 mEq/L (ref 135–145)

## 2019-01-07 LAB — MICROALBUMIN / CREATININE URINE RATIO
Creatinine,U: 151.7 mg/dL
Microalb Creat Ratio: 0.5 mg/g (ref 0.0–30.0)
Microalb, Ur: <0.7 mg/dL (ref 0.0–1.9)

## 2019-01-07 LAB — HEMOGLOBIN A1C: Hgb A1c MFr Bld: 6.2 % (ref 4.6–6.5)

## 2019-01-07 NOTE — Assessment & Plan Note (Signed)
DM: Diet control, check A1c and micro.  Feet exam negative. HTN: BPs were in the 145, 150, self restart Maxide, half tablet daily.  BPs are now in the 120s.  Check a BMP Right knee pain: Seen by Dr. Tonita Cong, recent MRI was done, she was offered a TKR, Pt  Asking a referral to Dr. Maureen Ralphs for 2nd opinion.  Will arrange Preventive care: Had a flu shot already RTC 04-2019 CPX

## 2019-01-08 ENCOUNTER — Telehealth: Payer: Self-pay | Admitting: Hematology & Oncology

## 2019-01-08 LAB — PAIN MGMT, PROFILE 8 W/CONF, U
6 Acetylmorphine: NEGATIVE ng/mL
Alcohol Metabolites: NEGATIVE ng/mL (ref <500)
Alphahydroxyalprazolam: NEGATIVE ng/mL
Alphahydroxymidazolam: NEGATIVE ng/mL
Alphahydroxytriazolam: NEGATIVE ng/mL
Aminoclonazepam: NEGATIVE ng/mL
Amphetamines: NEGATIVE ng/mL
Benzodiazepines: POSITIVE ng/mL
Buprenorphine, Urine: NEGATIVE ng/mL
Cocaine Metabolite: NEGATIVE ng/mL
Creatinine: 154.4 mg/dL
Hydroxyethylflurazepam: NEGATIVE ng/mL
Lorazepam: NEGATIVE ng/mL
MDMA: NEGATIVE ng/mL
Marijuana Metabolite: NEGATIVE ng/mL
Nordiazepam: NEGATIVE ng/mL
Opiates: NEGATIVE ng/mL
Oxazepam: 2978 ng/mL
Oxidant: NEGATIVE ug/mL
Oxycodone: NEGATIVE ng/mL
Temazepam: >8000 ng/mL
pH: 5.5 (ref 4.5–9.0)

## 2019-01-08 NOTE — Telephone Encounter (Signed)
Patient called to confirm appointments for 9/17.

## 2019-01-09 ENCOUNTER — Inpatient Hospital Stay: Payer: Medicare Other

## 2019-01-09 ENCOUNTER — Inpatient Hospital Stay: Payer: Medicare Other | Attending: Hematology & Oncology | Admitting: Family

## 2019-01-09 ENCOUNTER — Encounter: Payer: Self-pay | Admitting: Family

## 2019-01-09 ENCOUNTER — Other Ambulatory Visit: Payer: Self-pay

## 2019-01-09 VITALS — BP 137/72 | HR 70 | Temp 97.5°F | Resp 19 | Ht 68.0 in | Wt 262.0 lb

## 2019-01-09 DIAGNOSIS — I2690 Septic pulmonary embolism without acute cor pulmonale: Secondary | ICD-10-CM | POA: Diagnosis not present

## 2019-01-09 DIAGNOSIS — Z882 Allergy status to sulfonamides status: Secondary | ICD-10-CM | POA: Diagnosis not present

## 2019-01-09 DIAGNOSIS — Z7901 Long term (current) use of anticoagulants: Secondary | ICD-10-CM | POA: Diagnosis not present

## 2019-01-09 DIAGNOSIS — I2699 Other pulmonary embolism without acute cor pulmonale: Secondary | ICD-10-CM | POA: Diagnosis not present

## 2019-01-09 DIAGNOSIS — M79604 Pain in right leg: Secondary | ICD-10-CM

## 2019-01-09 LAB — CMP (CANCER CENTER ONLY)
ALT: 17 U/L (ref 0–44)
AST: 21 U/L (ref 15–41)
Albumin: 4 g/dL (ref 3.5–5.0)
Alkaline Phosphatase: 61 U/L (ref 38–126)
Anion gap: 6 (ref 5–15)
BUN: 15 mg/dL (ref 8–23)
CO2: 32 mmol/L (ref 22–32)
Calcium: 9.3 mg/dL (ref 8.9–10.3)
Chloride: 102 mmol/L (ref 98–111)
Creatinine: 1.06 mg/dL — ABNORMAL HIGH (ref 0.44–1.00)
GFR, Est AFR Am: >60 mL/min (ref >60)
GFR, Estimated: 55 mL/min — ABNORMAL LOW (ref >60)
Glucose, Bld: 99 mg/dL (ref 70–99)
Potassium: 3.5 mmol/L (ref 3.5–5.1)
Sodium: 140 mmol/L (ref 135–145)
Total Bilirubin: 0.4 mg/dL (ref 0.3–1.2)
Total Protein: 7.1 g/dL (ref 6.5–8.1)

## 2019-01-09 LAB — CBC WITH DIFFERENTIAL (CANCER CENTER ONLY)
Abs Immature Granulocytes: 0.01 10*3/uL (ref 0.00–0.07)
Basophils Absolute: 0 10*3/uL (ref 0.0–0.1)
Basophils Relative: 1 %
Eosinophils Absolute: 0.2 10*3/uL (ref 0.0–0.5)
Eosinophils Relative: 4 %
HCT: 38 % (ref 36.0–46.0)
Hemoglobin: 12.1 g/dL (ref 12.0–15.0)
Immature Granulocytes: 0 %
Lymphocytes Relative: 40 %
Lymphs Abs: 1.7 10*3/uL (ref 0.7–4.0)
MCH: 28.3 pg (ref 26.0–34.0)
MCHC: 31.8 g/dL (ref 30.0–36.0)
MCV: 89 fL (ref 80.0–100.0)
Monocytes Absolute: 0.5 10*3/uL (ref 0.1–1.0)
Monocytes Relative: 12 %
Neutro Abs: 1.8 10*3/uL (ref 1.7–7.7)
Neutrophils Relative %: 43 %
Platelet Count: 219 10*3/uL (ref 150–400)
RBC: 4.27 MIL/uL (ref 3.87–5.11)
RDW: 15.5 % (ref 11.5–15.5)
WBC Count: 4.3 10*3/uL (ref 4.0–10.5)
nRBC: 0 % (ref 0.0–0.2)

## 2019-01-09 NOTE — Progress Notes (Signed)
Hematology and Oncology Follow Up Visit  KHYLE PROUD [id number] 08/25/1950 68 y.o. 01/09/2019   Principle Diagnosis:  Recurrent pulmonary embolism  Current Therapy:   Xarelto 5 mg po q day   Interim History:  Ms. Munos is here today for follow-up. She states that she has right knee pain and swelling due to a torn meniscus but is putting of surgery. She really does not want to have this done.  She is doing well on Xarelto 5 mg total taking 2 tablets PO daily. No episodes of bleeding, no bruising or petechiae.  No fever, chills, n/v, cough, rash, dizziness, SOB, chest pain, palpitations, abdominal pain or changes in bowel or bladder habits.  No numbness or tingling in her extremities.  No falls or syncopal episodes to report.  She has maintained a good appetite and is staying well hydrated. Her weight is stable.   ECOG Performance Status: 1 - Symptomatic but completely ambulatory  Medications:  Allergies as of 01/09/2019      Reactions   Morphine And Related Anaphylaxis, Shortness Of Breath   Promethazine Hcl Other (See Comments)   Hallucinations   Sulfonamide Derivatives Hives      Medication List       Accurate as of January 09, 2019 12:28 PM. If you have any questions, ask your nurse or doctor.        acetaminophen 500 MG tablet Commonly known as: TYLENOL Take 1 tablet (500 mg total) by mouth every 6 (six) hours as needed. What changed: reasons to take this   Aciphex 20 MG tablet Generic drug: RABEprazole Take 1 tablet by mouth twice daily   ALPRAZolam 0.5 MG tablet Commonly known as: XANAX TAKE 1 TABLET BY MOUTH ONCE DAILY AS NEEDED FOR ANXIETY What changed: See the new instructions.   bacitracin ointment Apply 1 application topically 2 (two) times daily.   cycloSPORINE 0.05 % ophthalmic emulsion Commonly known as: RESTASIS Place 1 drop into both eyes 2 (two) times daily.   escitalopram 10 MG tablet Commonly known as: LEXAPRO Take 1 tablet (10 mg  total) by mouth daily.   glucose blood test strip Check blood sugar once daily   hydrocortisone valerate cream 0.2 % Commonly known as: WESTCORT as needed.   hyoscyamine 0.125 MG SL tablet Commonly known as: LEVSIN SL DISSOLVE ONE TABLET IN MOUTH EVERY 4 HOURS AS NEEDED What changed:   how much to take  how to take this  when to take this  reasons to take this  additional instructions   OneTouch Delica Lancets 99991111 Misc Check blood sugar once daily   temazepam 30 MG capsule Commonly known as: RESTORIL TAKE 1 CAPSULE BY MOUTH AT BEDTIME AS NEEDED FOR SLEEP   triamterene-hydrochlorothiazide 37.5-25 MG tablet Commonly known as: MAXZIDE-25 Take 0.5 tablets by mouth daily.   Xarelto 2.5 MG Tabs tablet Generic drug: rivaroxaban Take 2 tablets by mouth once daily       Allergies:  Allergies  Allergen Reactions  . Morphine And Related Anaphylaxis and Shortness Of Breath  . Promethazine Hcl Other (See Comments)    Hallucinations  . Sulfonamide Derivatives Hives    Past Medical History, Surgical history, Social history, and Family History were reviewed and updated.  Review of Systems: All other 10 point review of systems is negative.   Physical Exam:  vitals were not taken for this visit.   Wt Readings from Last 3 Encounters:  01/06/19 264 lb 9 oz (120 kg)  01/06/19 264 lb 9.6  oz (120 kg)  12/02/18 260 lb 12.8 oz (118.3 kg)    Ocular: Sclerae unicteric, pupils equal, round and reactive to light Ear-nose-throat: Oropharynx clear, dentition fair Lymphatic: No cervical or supraclavicular adenopathy Lungs no rales or rhonchi, good excursion bilaterally Heart regular rate and rhythm, no murmur appreciated Abd soft, nontender, positive bowel sounds, no liver or spleen tip palpated on exam, no fluid wave  MSK no focal spinal tenderness, no joint edema Neuro: non-focal, well-oriented, appropriate affect Breasts: Deferred    Lab Results  Component Value Date    WBC 4.3 01/09/2019   HGB 12.1 01/09/2019   HCT 38.0 01/09/2019   MCV 89.0 01/09/2019   PLT 219 01/09/2019   Lab Results  Component Value Date   FERRITIN 165 08/13/2017   IRON 60 08/13/2017   TIBC 260 08/13/2017   UIBC 200 08/13/2017   IRONPCTSAT 23 08/13/2017   Lab Results  Component Value Date   RBC 4.27 01/09/2019   No results found for: KPAFRELGTCHN, LAMBDASER, KAPLAMBRATIO No results found for: IGGSERUM, IGA, IGMSERUM No results found for: [name], SPEI   Chemistry      Component Value Date/Time   NA 140 01/09/2019 1148   NA 143 02/16/2017 1450   NA 142 01/19/2016 1141   K 3.5 01/09/2019 1148   K 3.7 02/16/2017 1450   K 3.7 01/19/2016 1141   CL 102 01/09/2019 1148   CL 105 02/16/2017 1450   CO2 32 01/09/2019 1148   CO2 32 02/16/2017 1450   CO2 27 01/19/2016 1141   BUN 15 01/09/2019 1148   BUN 14 02/16/2017 1450   BUN 19.5 01/19/2016 1141   CREATININE 1.06 (H) 01/09/2019 1148   CREATININE 1.2 02/16/2017 1450   CREATININE 0.9 01/19/2016 1141      Component Value Date/Time   CALCIUM 9.3 01/09/2019 1148   CALCIUM 8.8 02/16/2017 1450   CALCIUM 8.8 01/19/2016 1141   ALKPHOS 61 01/09/2019 1148   ALKPHOS 57 02/16/2017 1450   ALKPHOS 59 01/19/2016 1141   AST 21 01/09/2019 1148   AST 21 01/19/2016 1141   ALT 17 01/09/2019 1148   ALT 27 02/16/2017 1450   ALT 18 01/19/2016 1141   BILITOT 0.4 01/09/2019 1148   BILITOT 0.31 01/19/2016 1141       Impression and Plan: Ms. Chino is a very pleasant 68 yo African American female with history of recurrent PE. Initial thrombus was in 2012 and recurrence in 2015.  She continues to do well on maintenance Xarelto and has had no new recurrences.  She will continue her same regimen.  We will see her back in another 4 months.  She will contact our office with any questions or concerns. We can certainly see her sooner if needed.   Laverna Peace, NP 9/17/202012:28  PM

## 2019-01-11 LAB — LUPUS ANTICOAGULANT PANEL
DRVVT: 41.2 s (ref 0.0–47.0)
PTT Lupus Anticoagulant: 35.5 s (ref 0.0–51.9)

## 2019-01-12 LAB — CARDIOLIPIN ANTIBODIES, IGG, IGM, IGA
Anticardiolipin IgA: <9 APL U/mL (ref 0–11)
Anticardiolipin IgG: <9 GPL U/mL (ref 0–14)
Anticardiolipin IgM: 13 MPL U/mL — ABNORMAL HIGH (ref 0–12)

## 2019-01-13 ENCOUNTER — Telehealth: Payer: Self-pay | Admitting: Hematology & Oncology

## 2019-01-13 NOTE — Telephone Encounter (Signed)
Called and LMVm for patient w/ updated appointments letter/calendar mailed as well per 9/17 los

## 2019-01-22 ENCOUNTER — Telehealth: Payer: Self-pay | Admitting: Internal Medicine

## 2019-01-23 NOTE — Telephone Encounter (Signed)
Temazepam refill.   Last OV:  01/06/2019 Last Fill: 11/25/2018 #30 and 1RF Pt sig: 1 capsule qhs prn UDS: 01/06/2019 Low risk

## 2019-01-23 NOTE — Telephone Encounter (Signed)
Sent!

## 2019-01-30 ENCOUNTER — Other Ambulatory Visit: Payer: Self-pay | Admitting: Hematology & Oncology

## 2019-01-30 ENCOUNTER — Other Ambulatory Visit: Payer: Self-pay | Admitting: Internal Medicine

## 2019-01-30 DIAGNOSIS — M1711 Unilateral primary osteoarthritis, right knee: Secondary | ICD-10-CM | POA: Diagnosis not present

## 2019-02-18 DIAGNOSIS — Z6838 Body mass index (BMI) 38.0-38.9, adult: Secondary | ICD-10-CM | POA: Diagnosis not present

## 2019-02-18 DIAGNOSIS — M1711 Unilateral primary osteoarthritis, right knee: Secondary | ICD-10-CM | POA: Diagnosis not present

## 2019-02-19 ENCOUNTER — Ambulatory Visit (INDEPENDENT_AMBULATORY_CARE_PROVIDER_SITE_OTHER): Payer: Medicare Other | Admitting: Pulmonary Disease

## 2019-02-19 ENCOUNTER — Encounter: Payer: Self-pay | Admitting: Pulmonary Disease

## 2019-02-19 ENCOUNTER — Other Ambulatory Visit: Payer: Self-pay

## 2019-02-19 DIAGNOSIS — G4710 Hypersomnia, unspecified: Secondary | ICD-10-CM | POA: Diagnosis not present

## 2019-02-19 DIAGNOSIS — G4730 Sleep apnea, unspecified: Secondary | ICD-10-CM | POA: Diagnosis not present

## 2019-02-19 NOTE — Assessment & Plan Note (Signed)
Weight loss encouraged 

## 2019-02-19 NOTE — Addendum Note (Signed)
Addended by: Amado Coe on: 02/19/2019 03:14 PM   Modules accepted: Orders

## 2019-02-19 NOTE — Assessment & Plan Note (Signed)
Still has breakthrough and apneas -which may be related to weight gain Increase auto CPAP settings to 12 to 18 cm and repeat download in 1 month.  Weight loss encouraged, compliance with goal of at least 4-6 hrs every night is the expectation. Advised against medications with sedative side effects Cautioned against driving when sleepy - understanding that sleepiness will vary on a day to day basis   You are cleared for knee surgery

## 2019-02-19 NOTE — Progress Notes (Signed)
Subjective:    Patient ID: Jennifer [name], female    DOB: 11/15/1950, 68 y.o.   MRN: [id number]  HPI  43 yonever smoker for FU of  positional obstructive sleep apnea She has mild intermittent cough attributed to GERD - has Nisen's x 3 -last dilation 2018 byGessner She had a PE in 12/2013 and is on Xarelto since then-followed by [name]. ED visit 2/26 for laughter induced syncope.  CT angiogram was negative for PE.  Head CT was negative, troponins negative  She reports excessive daytime somnolence.  She is compliant with her CPAP this is objectively confirmed on download which shows great compliance more than 6 hours every night but has residual events about 15/hour with average pressure 15 cm on auto CPAP 12 to 15 cm  She also reports some degree of insomnia, she takes Restoril as needed and also Xanax. She is compliant with Xarelto  She is contemplating right knee surgery but would like to hold off until Covid situation improves  Unfortunately she has gained weight during this pandemic. Her episodes of syncope and cough have resolved   Significant tests/ events reviewed  PSG 01/2003-wt210 lbs showed a TST of 250 mins incl 37 mins of REM, RDI was 15/h with lowest desaturation of 84% &moderate snoring, predominantly REM related events.  PSG april'11 (256 lbs )>>Mild-to-moderate obstructive sleep apnea - RDI was 17/h, non supine AHI was only 1.7/h   June , 2011- portable study shows AHI of 10/h with desaturation index 12/h .  psg (250 lbs) aug'11 - cpap titrated to 9 cm, small full face mask    Past Medical History:  Diagnosis Date  . Anemia   . Anxiety   . Chronic chest pain   . Chronic fatigue 02/03/2017  . Colon polyps    inflamed partially ulcerated hyperplastic polyp  . Diabetes ([place]) 09/24/2013  . Diverticulosis   . Dysphagia esophageal phase - chronic after 3 fundoplications [id number]  . Esophageal stenosis    Stricture after fundoplication REDO  . GERD  (gastroesophageal reflux disease)   . Hemorrhoids, internal, with bleeding 06/27/2017  . Hypertension   . IBS (irritable bowel syndrome)   . Mitral valve prolapse   . OSA (obstructive sleep apnea)    on CPAP-Mild - Sleep study 2004  . Pulmonary embolus ([place])    2010 and 12-2013  . Shortness of breath    with exertion  . Sleep apnea    wears CPAP  . Tinnitus    Subjective  . Zoster 2014   R flank    Review of Systems neg for any significant sore throat, dysphagia, itching, sneezing, nasal congestion or excess/ purulent secretions, fever, chills, sweats, unintended wt loss, pleuritic or exertional cp, hempoptysis, orthopnea pnd or change in chronic leg swelling. Also denies presyncope, palpitations, heartburn, abdominal pain, nausea, vomiting, diarrhea or change in bowel or urinary habits, dysuria,hematuria, rash, arthralgias, visual complaints, headache, numbness weakness or ataxia.     Objective:   Physical Exam   Gen. Pleasant, obese, in no distress, normal affect ENT - no pallor,icterus, no post nasal drip, class 2-3 airway Neck: No JVD, no thyromegaly, no carotid bruits Lungs: no use of accessory muscles, no dullness to percussion, decreased without rales or rhonchi  Cardiovascular: Rhythm regular, heart sounds  normal, no murmurs or gallops, no peripheral edema Abdomen: soft and non-tender, no hepatosplenomegaly, BS normal. Musculoskeletal: No deformities, no cyanosis or clubbing Neuro:  alert, non focal, no tremors  Assessment & Plan:

## 2019-02-19 NOTE — Patient Instructions (Signed)
Increase auto CPAP settings to 12 to 18 cm and repeat download in 1 month.  You are cleared for knee surgery

## 2019-02-21 ENCOUNTER — Other Ambulatory Visit: Payer: Self-pay | Admitting: Hematology & Oncology

## 2019-02-21 ENCOUNTER — Other Ambulatory Visit: Payer: Self-pay | Admitting: Internal Medicine

## 2019-03-01 ENCOUNTER — Other Ambulatory Visit: Payer: Self-pay | Admitting: Internal Medicine

## 2019-03-01 ENCOUNTER — Other Ambulatory Visit: Payer: Self-pay | Admitting: Hematology & Oncology

## 2019-03-03 ENCOUNTER — Other Ambulatory Visit: Payer: Self-pay | Admitting: *Deleted

## 2019-03-03 MED ORDER — XARELTO 2.5 MG PO TABS: 5.0000 mg | ORAL_TABLET | Freq: Every day | ORAL | 0 refills | Status: DC

## 2019-03-25 ENCOUNTER — Other Ambulatory Visit: Payer: Self-pay | Admitting: Internal Medicine

## 2019-04-22 LAB — HM DIABETES EYE EXAM

## 2019-04-30 ENCOUNTER — Encounter: Payer: Self-pay | Admitting: *Deleted

## 2019-05-02 ENCOUNTER — Encounter: Payer: Self-pay | Admitting: *Deleted

## 2019-05-03 ENCOUNTER — Other Ambulatory Visit: Payer: Self-pay | Admitting: Hematology & Oncology

## 2019-05-03 ENCOUNTER — Other Ambulatory Visit: Payer: Self-pay | Admitting: Internal Medicine

## 2019-05-05 ENCOUNTER — Telehealth: Payer: Self-pay

## 2019-05-05 NOTE — Telephone Encounter (Signed)
I have started a prior authorization for patient's Aciphex 20mg  one BID for patient's GERD K21.9 thru [employer].

## 2019-05-09 ENCOUNTER — Encounter: Payer: Medicare Other | Admitting: Internal Medicine

## 2019-05-09 NOTE — Telephone Encounter (Signed)
Patient's Rabeprazole 20mg  has been approved thru 05/06/2020. Pharmacy informed.

## 2019-05-12 ENCOUNTER — Inpatient Hospital Stay (HOSPITAL_BASED_OUTPATIENT_CLINIC_OR_DEPARTMENT_OTHER): Payer: Medicare Other | Admitting: Hematology & Oncology

## 2019-05-12 ENCOUNTER — Other Ambulatory Visit: Payer: Self-pay

## 2019-05-12 ENCOUNTER — Telehealth: Payer: Self-pay | Admitting: Hematology & Oncology

## 2019-05-12 ENCOUNTER — Inpatient Hospital Stay: Payer: Medicare Other | Attending: Hematology & Oncology

## 2019-05-12 VITALS — BP 133/62 | HR 70 | Temp 97.6°F | Wt 264.1 lb

## 2019-05-12 DIAGNOSIS — R911 Solitary pulmonary nodule: Secondary | ICD-10-CM | POA: Diagnosis not present

## 2019-05-12 DIAGNOSIS — Z86711 Personal history of pulmonary embolism: Secondary | ICD-10-CM | POA: Insufficient documentation

## 2019-05-12 DIAGNOSIS — I2699 Other pulmonary embolism without acute cor pulmonale: Secondary | ICD-10-CM

## 2019-05-12 DIAGNOSIS — Z7901 Long term (current) use of anticoagulants: Secondary | ICD-10-CM | POA: Diagnosis not present

## 2019-05-12 DIAGNOSIS — M1990 Unspecified osteoarthritis, unspecified site: Secondary | ICD-10-CM | POA: Diagnosis not present

## 2019-05-12 DIAGNOSIS — Z79899 Other long term (current) drug therapy: Secondary | ICD-10-CM | POA: Insufficient documentation

## 2019-05-12 DIAGNOSIS — I2609 Other pulmonary embolism with acute cor pulmonale: Secondary | ICD-10-CM | POA: Diagnosis not present

## 2019-05-12 DIAGNOSIS — I2690 Septic pulmonary embolism without acute cor pulmonale: Secondary | ICD-10-CM

## 2019-05-12 LAB — CBC WITH DIFFERENTIAL (CANCER CENTER ONLY)
Abs Immature Granulocytes: 0.02 10*3/uL (ref 0.00–0.07)
Basophils Absolute: 0.1 10*3/uL (ref 0.0–0.1)
Basophils Relative: 1 %
Eosinophils Absolute: 0.2 10*3/uL (ref 0.0–0.5)
Eosinophils Relative: 4 %
HCT: 38.5 % (ref 36.0–46.0)
Hemoglobin: 12.6 g/dL (ref 12.0–15.0)
Immature Granulocytes: 0 %
Lymphocytes Relative: 42 %
Lymphs Abs: 1.9 10*3/uL (ref 0.7–4.0)
MCH: 28.6 pg (ref 26.0–34.0)
MCHC: 32.7 g/dL (ref 30.0–36.0)
MCV: 87.5 fL (ref 80.0–100.0)
Monocytes Absolute: 0.6 10*3/uL (ref 0.1–1.0)
Monocytes Relative: 12 %
Neutro Abs: 1.9 10*3/uL (ref 1.7–7.7)
Neutrophils Relative %: 41 %
Platelet Count: 227 10*3/uL (ref 150–400)
RBC: 4.4 MIL/uL (ref 3.87–5.11)
RDW: 15.2 % (ref 11.5–15.5)
WBC Count: 4.6 10*3/uL (ref 4.0–10.5)
nRBC: 0 % (ref 0.0–0.2)

## 2019-05-12 LAB — CMP (CANCER CENTER ONLY)
ALT: 16 U/L (ref 0–44)
AST: 19 U/L (ref 15–41)
Albumin: 4.3 g/dL (ref 3.5–5.0)
Alkaline Phosphatase: 55 U/L (ref 38–126)
Anion gap: 8 (ref 5–15)
BUN: 19 mg/dL (ref 8–23)
CO2: 28 mmol/L (ref 22–32)
Calcium: 9.4 mg/dL (ref 8.9–10.3)
Chloride: 103 mmol/L (ref 98–111)
Creatinine: 0.92 mg/dL (ref 0.44–1.00)
GFR, Est AFR Am: >60 mL/min (ref >60)
GFR, Estimated: >60 mL/min (ref >60)
Glucose, Bld: 108 mg/dL — ABNORMAL HIGH (ref 70–99)
Potassium: 3.6 mmol/L (ref 3.5–5.1)
Sodium: 139 mmol/L (ref 135–145)
Total Bilirubin: 0.3 mg/dL (ref 0.3–1.2)
Total Protein: 6.9 g/dL (ref 6.5–8.1)

## 2019-05-12 NOTE — Telephone Encounter (Signed)
Appointments scheduled calendar printed per 11/8 los 

## 2019-05-12 NOTE — Progress Notes (Signed)
Patient states she has chronic right knee pain and is need of a knew replacement.  She also states her right shoulder feels strained from picking heavy bottled water.

## 2019-05-12 NOTE — Progress Notes (Signed)
Hematology and Oncology Follow Up Visit  Jennifer [name] [id number] 1951-04-08 69 y.o. 05/12/2019   Principle Diagnosis:  Recurrent pulmonary embolism  Current Therapy:   Xarelto 5 mg po q day   Interim History:  [name] is here today for for follow-up.  She is doing okay.  We last saw her back in September.  At that time, she recently had a Doppler of the legs.  This was done on 12/02/2018.  There is no evidence of deep vein thrombosis.  The real problem she has is her right knee.  She has arthritis.  She likely will need surgery at some point.  Currently, she is going to start injections into the right knee.  I told her that if she has the injections, that she needs to stop the Xarelto 2 days before each injection and then restart the day of the injection after she has her injection.  She has had no other difficulties.  She has been very cautious with the coronavirus.  She has had no chest wall pain.  She has had no shortness of breath.  She has had no cough.  There is been no leg swelling.  She has had no change in bowel or bladder habits.  Overall, her performance status is ECOG 1\  Medications:  Allergies as of 05/12/2019      Reactions   Morphine And Related Anaphylaxis, Shortness Of Breath   Promethazine Hcl Other (See Comments)   Hallucinations   Sulfonamide Derivatives Hives      Medication List       Accurate as of May 12, 2019 12:46 PM. If you have any questions, ask your nurse or doctor.        acetaminophen 500 MG tablet Commonly known as: TYLENOL Take 1 tablet (500 mg total) by mouth every 6 (six) hours as needed. What changed: reasons to take this   Aciphex 20 MG tablet Generic drug: RABEprazole Take 1 tablet by mouth twice daily   ALPRAZolam 0.5 MG tablet Commonly known as: XANAX TAKE 1 TABLET BY MOUTH ONCE DAILY AS NEEDED FOR ANXIETY What changed: See the new instructions.   bacitracin ointment Apply 1 application topically 2 (two) times  daily.   cycloSPORINE 0.05 % ophthalmic emulsion Commonly known as: RESTASIS Place 1 drop into both eyes 2 (two) times daily.   escitalopram 10 MG tablet Commonly known as: LEXAPRO Take 1 tablet (10 mg total) by mouth daily.   glucose blood test strip Check blood sugar once daily   hydrocortisone valerate cream 0.2 % Commonly known as: WESTCORT as needed.   hyoscyamine 0.125 MG SL tablet Commonly known as: LEVSIN SL DISSOLVE ONE TABLET IN MOUTH EVERY 4 HOURS AS NEEDED What changed:   how much to take  how to take this  when to take this  reasons to take this  additional instructions   OneTouch Delica Lancets 99991111 Misc Check blood sugar once daily   temazepam 30 MG capsule Commonly known as: RESTORIL TAKE 1 CAPSULE BY MOUTH AT BEDTIME AS NEEDED FOR SLEEP   triamterene-hydrochlorothiazide 37.5-25 MG tablet Commonly known as: MAXZIDE-25 Take 0.5 tablets by mouth daily.   Xarelto 2.5 MG Tabs tablet Generic drug: rivaroxaban Take 2 tablets by mouth once daily       Allergies:  Allergies  Allergen Reactions  . Morphine And Related Anaphylaxis and Shortness Of Breath  . Promethazine Hcl Other (See Comments)    Hallucinations  . Sulfonamide Derivatives Hives    Past  Medical History, Surgical history, Social history, and Family History were reviewed and updated.  Review of Systems: Review of Systems  Constitutional: Negative.   HENT: Negative.   Eyes: Negative.   Respiratory: Negative.   Cardiovascular: Negative.   Gastrointestinal: Negative.   Genitourinary: Negative.   Musculoskeletal: Negative.   Skin: Negative.   Neurological: Negative.   Endo/Heme/Allergies: Negative.   Psychiatric/Behavioral: Negative.      Physical Exam:  weight is 264 lb 1.6 oz (119.8 kg). Her other (comment) temperature is 97.6 F (36.4 C). Her blood pressure is 133/62 and her pulse is 70.   Wt Readings from Last 3 Encounters:  05/12/19 264 lb 1.6 oz (119.8 kg)   02/19/19 266 lb 3.2 oz (120.7 kg)  01/09/19 262 lb (118.8 kg)    Physical Exam Vitals reviewed.  HENT:     Head: Normocephalic and atraumatic.  Eyes:     Pupils: Pupils are equal, round, and reactive to light.  Cardiovascular:     Rate and Rhythm: Normal rate and regular rhythm.     Heart sounds: Normal heart sounds.  Pulmonary:     Effort: Pulmonary effort is normal.     Breath sounds: Normal breath sounds.  Abdominal:     General: Bowel sounds are normal.     Palpations: Abdomen is soft.  Musculoskeletal:        General: No tenderness or deformity. Normal range of motion.     Cervical back: Normal range of motion.  Lymphadenopathy:     Cervical: No cervical adenopathy.  Skin:    General: Skin is warm and dry.     Findings: No erythema or rash.  Neurological:     Mental Status: She is alert and oriented to person, place, and time.  Psychiatric:        Behavior: Behavior normal.        Thought Content: Thought content normal.        Judgment: Judgment normal.      Lab Results  Component Value Date   WBC 4.6 05/12/2019   HGB 12.6 05/12/2019   HCT 38.5 05/12/2019   MCV 87.5 05/12/2019   PLT 227 05/12/2019   Lab Results  Component Value Date   FERRITIN 165 08/13/2017   IRON 60 08/13/2017   TIBC 260 08/13/2017   UIBC 200 08/13/2017   IRONPCTSAT 23 08/13/2017   Lab Results  Component Value Date   RBC 4.40 05/12/2019   No results found for: KPAFRELGTCHN, LAMBDASER, KAPLAMBRATIO No results found for: IGGSERUM, IGA, IGMSERUM No results found for: [name], SPEI   Chemistry      Component Value Date/Time   NA 139 05/12/2019 1204   NA 143 02/16/2017 1450   NA 142 01/19/2016 1141   K 3.6 05/12/2019 1204   K 3.7 02/16/2017 1450   K 3.7 01/19/2016 1141   CL 103 05/12/2019 1204   CL 105 02/16/2017 1450   CO2 28 05/12/2019 1204   CO2 32 02/16/2017 1450   CO2 27 01/19/2016 1141   BUN 19 05/12/2019  1204   BUN 14 02/16/2017 1450   BUN 19.5 01/19/2016 1141   CREATININE 0.92 05/12/2019 1204   CREATININE 1.2 02/16/2017 1450   CREATININE 0.9 01/19/2016 1141      Component Value Date/Time   CALCIUM 9.4 05/12/2019 1204   CALCIUM 8.8 02/16/2017 1450   CALCIUM 8.8 01/19/2016 1141   ALKPHOS 55 05/12/2019 1204   ALKPHOS 57 02/16/2017 1450  ALKPHOS 59 01/19/2016 1141   AST 19 05/12/2019 1204   AST 21 01/19/2016 1141   ALT 16 05/12/2019 1204   ALT 27 02/16/2017 1450   ALT 18 01/19/2016 1141   BILITOT 0.3 05/12/2019 1204   BILITOT 0.31 01/19/2016 1141      Impression and Plan: Ms. Evens is a very pleasant 69 yo African American female with history of recurrent PE. Her initial thrombus was diagnosed with her initial thrombus in 2012 and then recurred in 2015.   Again, she wanted to be on life long anticoagulation.  I think 5 mg daily would be reasonable.  I don't see any problem with her having knee surgery.  I would not think that she needs to have a filter placed since there is no thrombotic events in her leg right now.  I will plan to see her back in 4 months.  [name], MD 1/18/202112:46 PM

## 2019-05-13 ENCOUNTER — Telehealth: Payer: Self-pay | Admitting: Hematology & Oncology

## 2019-05-13 LAB — LUPUS ANTICOAGULANT PANEL
DRVVT: 39.7 s (ref 0.0–47.0)
PTT Lupus Anticoagulant: 31.4 s (ref 0.0–51.9)

## 2019-05-13 NOTE — Telephone Encounter (Signed)
Verbal los was given by Dr Marin Olp 1/18 los

## 2019-05-14 LAB — CARDIOLIPIN ANTIBODIES, IGG, IGM, IGA
Anticardiolipin IgA: <9 APL U/mL (ref 0–11)
Anticardiolipin IgG: <9 GPL U/mL (ref 0–14)
Anticardiolipin IgM: 13 MPL U/mL — ABNORMAL HIGH (ref 0–12)

## 2019-05-19 ENCOUNTER — Ambulatory Visit (HOSPITAL_BASED_OUTPATIENT_CLINIC_OR_DEPARTMENT_OTHER)
Admission: RE | Admit: 2019-05-19 | Discharge: 2019-05-19 | Disposition: A | Discharge status: Home / Self Care | Payer: Medicare Other | Source: Ambulatory Visit | Attending: Internal Medicine | Admitting: Internal Medicine

## 2019-05-19 ENCOUNTER — Other Ambulatory Visit: Payer: Self-pay

## 2019-05-19 ENCOUNTER — Ambulatory Visit (INDEPENDENT_AMBULATORY_CARE_PROVIDER_SITE_OTHER): Payer: Medicare Other | Admitting: Internal Medicine

## 2019-05-19 ENCOUNTER — Encounter: Payer: Self-pay | Admitting: Internal Medicine

## 2019-05-19 VITALS — BP 129/65 | HR 76 | Temp 96.6°F | Resp 16 | Ht 69.0 in | Wt 263.5 lb

## 2019-05-19 DIAGNOSIS — E119 Type 2 diabetes mellitus without complications: Secondary | ICD-10-CM

## 2019-05-19 DIAGNOSIS — Z01419 Encounter for gynecological examination (general) (routine) without abnormal findings: Secondary | ICD-10-CM | POA: Diagnosis not present

## 2019-05-19 DIAGNOSIS — E041 Nontoxic single thyroid nodule: Secondary | ICD-10-CM | POA: Diagnosis not present

## 2019-05-19 DIAGNOSIS — Z0000 Encounter for general adult medical examination without abnormal findings: Secondary | ICD-10-CM

## 2019-05-19 DIAGNOSIS — R1310 Dysphagia, unspecified: Secondary | ICD-10-CM | POA: Diagnosis not present

## 2019-05-19 MED ORDER — PANTOPRAZOLE SODIUM 40 MG PO TBEC: 40.0000 mg | DELAYED_RELEASE_TABLET | Freq: Every day | ORAL | 6 refills | Status: DC

## 2019-05-19 NOTE — Patient Instructions (Addendum)
  GO TO THE FRONT DESK Schedule labs to be done at your earliest convenience fasting  Next visit with me in 6 months, please schedule  STOP BY THE FIRST FLOOR: Schedule ultrasound of your thyroid  Take pantoprazole 40 mg 1 before breakfast

## 2019-05-19 NOTE — Progress Notes (Signed)
Subjective:    Patient ID: Jennifer [name], female    DOB: 1951-02-23, 69 y.o.   MRN: [id number]  DOS:  05/19/2019 Type of visit - description: cpx Here for CPX, in general feels well, she still has some GI and DJD problems.    Review of Systems Occasional dysphagia, not taking PPIs Occasional blood in the stools she thinks due to hemorrhoids.  Other than above, a 14 point review of systems is negative      Past Medical History:  Diagnosis Date  . Anemia   . Anxiety   . Chronic chest pain   . Chronic fatigue 02/03/2017  . Colon polyps    inflamed partially ulcerated hyperplastic polyp  . Diabetes ([place]) 09/24/2013  . Diverticulosis   . Dysphagia esophageal phase - chronic after 3 fundoplications [id number]  . Esophageal stenosis    Stricture after fundoplication REDO  . GERD (gastroesophageal reflux disease)   . Hemorrhoids, internal, with bleeding 06/27/2017  . Hypertension   . IBS (irritable bowel syndrome)   . Mitral valve prolapse   . OSA (obstructive sleep apnea)    on CPAP-Mild - Sleep study 2004  . Pulmonary embolus ([place])    2010 and 12-2013  . Shortness of breath    with exertion  . Sleep apnea    wears CPAP  . Tinnitus    Subjective  . Zoster 2014   R flank    Past Surgical History:  Procedure Laterality Date  . ABDOMINAL HYSTERECTOMY     still has 1 ovary   . APPENDECTOMY    . [place]   Stem surgery, Arnold Chiari Malformation  . BREAST REDUCTION SURGERY    . CATARACT EXTRACTION Bilateral 2017   cataracts, Dr. Herbert Deaner  . CHOLECYSTECTOMY  2008  . HEMORRHOID BANDING    . HERNIA REPAIR     Hital Hernia  . KNEE ARTHROSCOPY  05/16/2012   Procedure: ARTHROSCOPY KNEE;  Surgeon: Sharmon Revere, MD;  Location: [facility];  Service: Orthopedics;  Laterality: Left;  . KNEE ARTHROSCOPY Right 01/2018  . NISSEN FUNDOPLICATION     X 3 lab, open x 2 last with mesh  . REDUCTION MAMMAPLASTY Bilateral 2007   Family History  Problem Relation Age of Onset  .  Diabetes Mother   . Rheum arthritis Mother   . Hypertension Mother   . Hypertension Father   . Rheum arthritis Sister   . Hypertension Sister   . Colon cancer Maternal Grandmother   . Rheum arthritis Sister   . Rheum arthritis Sister   . Rheum arthritis Sister   . CAD Sister        MIdx age 69  . Stomach cancer Maternal Uncle   . Breast cancer Neg Hx   . Esophageal cancer Neg Hx   . Rectal cancer Neg Hx         Objective:   Physical Exam BP 129/65 (BP Location: Left Arm, Patient Position: Sitting, Cuff Size: Normal)   Pulse 76   Temp (!) 96.6 F (35.9 C) (Temporal)   Resp 16   Ht 5\' 9"  (1.753 m)   Wt 263 lb 8 oz (119.5 kg)   SpO2 97%   BMI 38.91 kg/m   General: Well developed, NAD, BMI noted Neck: No  thyromegaly  HEENT:  Normocephalic . Face symmetric, atraumatic Lungs:  CTA B Normal respiratory effort, no intercostal retractions, no accessory muscle use. Heart: RRR,  no murmur.  No pretibial edema bilaterally  Abdomen:  Not distended, soft, non-tender. No rebound or rigidity.   Skin: Exposed areas without rash. Not pale. Not jaundice Neurologic:  alert & oriented X3.  Speech normal, gait somewhat limited due to DJD Strength symmetric and appropriate for age.  Psych: Cognition and judgment appear intact.  Cooperative with normal attention span and concentration.  Behavior appropriate. No anxious or depressed appearing.     Assessment     Assessment   DM, + neuropathy. Foot exam 04-2016 HTN Anxiety, insomnia: On restoril prn, lexapro, xanax for flying only Thyroid nodule: Bx (non neoplastic goiter) 2014,  [place] 05-2016, next 2,3 years Pulmonary: --OSA, sleep study 2004, on a CPAP --Pulmonary emboli: Lifelong anticoagulation PEs:2010 and 12-2013 - saw hematology 12-2014, rec anticoag x 2 years (until 12-2015), then xarelto dose  decreased   -- multiple pulmonary nodule, last CT 11-2013: No further imaging suggested --RAD, inhalers  rarely Fibromyalgia CV: --MVP -- carotid US 1-39%s GI: --GERD, Esophageal stenosis --chronic dysphagia s/p  fundoplication 3  -- IBS -- chronic right flank,RUQ pain : since GB surgery 2008 Etiology not clear but likely multifactorial --> Adhesions from the surgery, neuropathy (DM), postherpetic neuralgia, scarring from PE, etc.  MRI T spine done 06-2014: DJD  Zoster --  2014 right flank  PLAN Here for CPX DM: Diet controlled, check A1c HTN: On Maxide, last BMP satisfactory, ambulatory BPs normal, no change Anxiety, insomnia: Continue Restoril and Lexapro. Thyroid nodule: Exam is normal, check ultrasound GERD, esophageal stenosis: Unable to get AcipHex and the generic is not working for her, having some dysphagia.  Likes to see [name], referral sent.  She also has intermittent bleeding from hemorrhoids.  To discuss with GI. For now we will try pantoprazole. DJD: Still has problems with the knee, doing the "gel shots". RTC 6 months   This visit occurred during the SARS-CoV-2 public health emergency.  Safety protocols were in place, including screening questions prior to the visit, additional usage of staff PPE, and extensive cleaning of exam room while observing appropriate contact time as indicated for disinfecting solutions.

## 2019-05-19 NOTE — Progress Notes (Signed)
Pre visit review using our clinic review tool, if applicable. No additional management support is needed unless otherwise documented below in the visit note. 

## 2019-05-20 ENCOUNTER — Other Ambulatory Visit: Payer: Medicare Other

## 2019-05-20 ENCOUNTER — Telehealth: Payer: Self-pay | Admitting: Internal Medicine

## 2019-05-20 NOTE — Assessment & Plan Note (Signed)
Here for CPX DM: Diet controlled, check A1c HTN: On Maxide, last BMP satisfactory, ambulatory BPs normal, no change Anxiety, insomnia: Continue Restoril and Lexapro. Thyroid nodule: Exam is normal, check ultrasound GERD, esophageal stenosis: Unable to get AcipHex and the generic is not working for her, having some dysphagia.  Likes to see [name], referral sent.  She also has intermittent bleeding from hemorrhoids.  To discuss with GI. For now we will try pantoprazole. DJD: Still has problems with the knee, doing the "gel shots". RTC 6 months

## 2019-05-20 NOTE — Telephone Encounter (Signed)
Patient is going to email the form we need to fill out to my [facility] email.

## 2019-05-20 NOTE — Assessment & Plan Note (Signed)
-  Td 2014 -  zostavax 2014 , s/p shingrex x 2 -Pneumonia shot 2016;  prevnar 2017 - had a flu shot    - Female care: last visit 2015, told to RTC 5 years, previous referral failed, will try again [facility] 10/2018 - [facility]: Colonoscopy 2009, cscope 11-2015, + polyp, next 5 year  per GI letter  -DEXA wnl 04/2017  -labs:  Reviewed, she will come back for FLP and A1c -Diet discussed.  Exercise very limited due to DJD

## 2019-05-21 ENCOUNTER — Telehealth: Payer: Self-pay | Admitting: Internal Medicine

## 2019-05-21 ENCOUNTER — Other Ambulatory Visit (INDEPENDENT_AMBULATORY_CARE_PROVIDER_SITE_OTHER): Payer: Federal, State, Local not specified - PPO

## 2019-05-21 ENCOUNTER — Other Ambulatory Visit: Payer: Self-pay

## 2019-05-21 DIAGNOSIS — E119 Type 2 diabetes mellitus without complications: Secondary | ICD-10-CM

## 2019-05-21 DIAGNOSIS — Z0000 Encounter for general adult medical examination without abnormal findings: Secondary | ICD-10-CM

## 2019-05-21 LAB — LIPID PANEL
Cholesterol: 169 mg/dL (ref 0–200)
HDL: 67 mg/dL (ref >39.00)
LDL Cholesterol: 84 mg/dL (ref 0–99)
NonHDL: 101.84
Total CHOL/HDL Ratio: 3
Triglycerides: 88 mg/dL (ref 0.0–149.0)
VLDL: 17.6 mg/dL (ref 0.0–40.0)

## 2019-05-21 LAB — HEMOGLOBIN A1C: Hgb A1c MFr Bld: 6.2 % (ref 4.6–6.5)

## 2019-05-21 NOTE — Telephone Encounter (Signed)
Sent, PDMP okay 

## 2019-05-21 NOTE — Telephone Encounter (Signed)
Temazepam refill.   Last OV: 05/19/2019 Last Fill: 01/23/2019 #30 and 3RF Pt sig: 1 tab qhs prn UDS: 01/06/2019 Low risk

## 2019-05-21 NOTE — Telephone Encounter (Signed)
I called and left Jennifer [name] a message that I got the form and Jennifer [name] signed it and I have faxed it in. We will await the outcome to get her brand name Aciphex.

## 2019-05-22 DIAGNOSIS — M25561 Pain in right knee: Secondary | ICD-10-CM | POA: Diagnosis not present

## 2019-05-22 DIAGNOSIS — M1711 Unilateral primary osteoarthritis, right knee: Secondary | ICD-10-CM | POA: Diagnosis not present

## 2019-05-26 NOTE — Telephone Encounter (Signed)
Jennifer [name] from [facility] Pharmacist and I spoke  He said he will approve the name brand aciphex 20 mg bid # [phone] 3 RF since Jennifer [name] cannot swallow anything but this one   Fax coming later today he says

## 2019-05-26 NOTE — Telephone Encounter (Signed)
BC/BS has denied patient's brand name Aciphex. She needs the brand name due to it being the size she can swallow. Other medicines are too large for her to swallow easily. Could you at your convenience call [phone] call to discuss this with them? I will put the paper in Dr Celesta Aver office.

## 2019-05-27 NOTE — Telephone Encounter (Signed)
We have received a fax from [place] with the approval for brand name Aciphex. Valid from 05/26/2019- 05/25/2020.

## 2019-05-27 NOTE — Telephone Encounter (Signed)
Patient informed. 

## 2019-05-30 DIAGNOSIS — M25561 Pain in right knee: Secondary | ICD-10-CM | POA: Diagnosis not present

## 2019-05-30 DIAGNOSIS — M1711 Unilateral primary osteoarthritis, right knee: Secondary | ICD-10-CM | POA: Diagnosis not present

## 2019-06-04 ENCOUNTER — Other Ambulatory Visit: Payer: Self-pay | Admitting: Hematology & Oncology

## 2019-06-04 ENCOUNTER — Other Ambulatory Visit: Payer: Self-pay | Admitting: Internal Medicine

## 2019-06-06 DIAGNOSIS — M179 Osteoarthritis of knee, unspecified: Secondary | ICD-10-CM | POA: Diagnosis not present

## 2019-06-06 DIAGNOSIS — M1711 Unilateral primary osteoarthritis, right knee: Secondary | ICD-10-CM | POA: Diagnosis not present

## 2019-06-16 ENCOUNTER — Encounter: Payer: Self-pay | Admitting: Internal Medicine

## 2019-06-16 ENCOUNTER — Ambulatory Visit (INDEPENDENT_AMBULATORY_CARE_PROVIDER_SITE_OTHER): Payer: Medicare Other | Admitting: Internal Medicine

## 2019-06-16 VITALS — BP 122/76 | HR 72 | Temp 98.4°F | Ht 67.75 in | Wt 259.1 lb

## 2019-06-16 DIAGNOSIS — K580 Irritable bowel syndrome with diarrhea: Secondary | ICD-10-CM

## 2019-06-16 DIAGNOSIS — R1310 Dysphagia, unspecified: Secondary | ICD-10-CM | POA: Diagnosis not present

## 2019-06-16 DIAGNOSIS — R1319 Other dysphagia: Secondary | ICD-10-CM

## 2019-06-16 MED ORDER — AMBULATORY NON FORMULARY MEDICATION: 1 refills | Status: DC

## 2019-06-16 NOTE — Progress Notes (Signed)
Jennifer [name] 69 y.o. 1950-08-24 [id number]  Assessment & Plan:   Encounter Diagnoses  Name Primary?  . Odynophagia Yes  . Esophageal dysphagia   . Irritable bowel syndrome with diarrhea     Regarding the esophageal symptoms I wonder if this is not an enhanced visceral nociception problem more than anything.  Nothing seems to completely control it and it has been a problem for many years.  She goes through periods of time where she is more tolerant of the symptoms.  Change in her PPI recently may have flared it as she is definitely worse.  Looking through her medications I do not think she is ever been on a neuromodulator for this.   My plan is to reassess with a barium swallow and tablet.  Consider amitriptyline or possible other neuromodulator though we discussed amitriptyline today.  She does not have a prolonged QTc interval.  This might help her with sleep she may need to reduce or eliminate chronic temazepam.  Other options would be something like duloxetine or perhaps gabapentin.  I would keep it simple with amitriptyline first which may help her irritable bowel syndrome with postprandial defecation problems.  Neither she nor I think EGD is needed at this point though it could be.  She is on anticoagulants.  Dilation requires holding them.  I appreciate the opportunity to care for this patient. CC: Colon Branch, MD   Subjective:   Chief Complaint: Dysphagia IBS  HPI Jennifer [name] is a 69 year old African-American woman with a long history of dysphagia after 3 Nissen fundoplication procedures, as well as IBS.  She most recently was denied her AcipHex, I got prior authorization on that and slowly she is better was better with less esophageal pain but is having a flare of painful swallowing.  She can feel everything go down and it feels like things stick at times.  This is nothing new and is not really clear that esophageal dilation is ever helped.  She does not have burning pain or classic  heartburn.  She remembers that we talked about the possibility of doing an esophageal manometry at some point.  In addition she is having bowel movements after most meals and will skip some days with moving her bowels but the biggest problem is urgent defecation after meals.  Once or twice a month she will have a very watery mouth and sometimes she is coughing up phlegm.  She was told by pulmonary that that was probably related to reflux.  "Because my lungs were clear".  Wt Readings from Last 3 Encounters:  06/16/19 259 lb 2 oz (117.5 kg)  05/19/19 263 lb 8 oz (119.5 kg)  05/12/19 264 lb 1.6 oz (119.8 kg)     Allergies  Allergen Reactions  . Morphine And Related Anaphylaxis and Shortness Of Breath  . Promethazine Hcl Other (See Comments)    Hallucinations  . Sulfonamide Derivatives Hives   Current Meds  Medication Sig  . acetaminophen (TYLENOL) 500 MG tablet Take 1 tablet (500 mg total) by mouth every 6 (six) hours as needed. (Patient taking differently: Take 500 mg by mouth every 6 (six) hours as needed for mild pain or fever. )  . ALPRAZolam (XANAX) 0.5 MG tablet TAKE 1 TABLET BY MOUTH ONCE DAILY AS NEEDED FOR ANXIETY (Patient taking differently: Take 0.5 mg by mouth at bedtime as needed for anxiety. )  . bacitracin ointment Apply 1 application topically 2 (two) times daily.  . cycloSPORINE (RESTASIS) 0.05 % ophthalmic  emulsion Place 1 drop into both eyes 2 (two) times daily.   Marland Kitchen escitalopram (LEXAPRO) 10 MG tablet Take 1 tablet (10 mg total) by mouth daily.  Marland Kitchen glucose blood test strip Check blood sugar once daily  . hydrocortisone valerate cream (WESTCORT) 0.2 % as needed.  . hyoscyamine (LEVSIN SL) 0.125 MG SL tablet DISSOLVE ONE TABLET IN MOUTH EVERY 4 HOURS AS NEEDED (Patient taking differently: Take 0.125 mg by mouth every 4 (four) hours as needed for cramping. )  . ONETOUCH DELICA LANCETS 99991111 MISC Check blood sugar once daily  . pantoprazole (PROTONIX) 40 MG tablet Take 1 tablet  (40 mg total) by mouth daily.  . temazepam (RESTORIL) 30 MG capsule TAKE 1 CAPSULE BY MOUTH AT BEDTIME AS NEEDED FOR SLEEP  . triamterene-hydrochlorothiazide (MAXZIDE-25) 37.5-25 MG tablet Take 0.5 tablets by mouth daily.  Alveda Reasons 2.5 MG TABS tablet Take 2 tablets by mouth once daily   Past Medical History:  Diagnosis Date  . Anemia   . Anxiety   . Chronic chest pain   . Chronic fatigue 02/03/2017  . Colon polyps    inflamed partially ulcerated hyperplastic polyp  . Diabetes ([place]) 09/24/2013  . Diverticulosis   . Dysphagia esophageal phase - chronic after 3 fundoplications [id number]  . Esophageal stenosis    Stricture after fundoplication REDO  . GERD (gastroesophageal reflux disease)   . Hemorrhoids, internal, with bleeding 06/27/2017  . Hypertension   . IBS (irritable bowel syndrome)   . Mitral valve prolapse   . OSA (obstructive sleep apnea)    on CPAP-Mild - Sleep study 25-Jul-2002  . Pulmonary embolus ([place])    24-Jul-2008 and 12-2013  . Shortness of breath    with exertion  . Sleep apnea    wears CPAP  . Tinnitus    Subjective  . Zoster 07/24/12   R flank   Past Surgical History:  Procedure Laterality Date  . ABDOMINAL HYSTERECTOMY     still has 1 ovary   . APPENDECTOMY    . [place]   Stem surgery, Arnold Chiari Malformation  . BREAST REDUCTION SURGERY    . CATARACT EXTRACTION Bilateral 2015/07/25   cataracts, Dr. Herbert Deaner  . CHOLECYSTECTOMY  Jul 25, 2006  . HEMORRHOID BANDING    . HERNIA REPAIR     Hital Hernia  . KNEE ARTHROSCOPY  05/16/2012   Procedure: ARTHROSCOPY KNEE;  Surgeon: Sharmon Revere, MD;  Location: [facility];  Service: Orthopedics;  Laterality: Left;  . KNEE ARTHROSCOPY Right 01/2018  . NISSEN FUNDOPLICATION     X 3 lab, open x 2 last with mesh  . 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   Review of Systems As above.   Objective:   Physical Exam BP 122/76 (BP Location: Left Arm, Patient Position: Sitting, Cuff Size: Normal)   Pulse 72   Temp 98.4 F (36.9 C)   Ht 5' 7.75" (1.721 m)  Wt 259 lb 2 oz (117.5 kg)   BMI 39.69 kg/m

## 2019-06-16 NOTE — Patient Instructions (Addendum)
Today we have given you a printed rx for a GI cocktail to take to the pharmacy.   You have been scheduled for a Barium Esophogram at [facility] on __3/2/2021____ at ______11:15am_____. Please arrive 20 minutes prior to your appointment for registration. Make certain not to have anything to eat or drink 3 hours prior to your test. If you need to reschedule for any reason, please contact radiology at [phone] to do so. __________________________________________________________________ A barium swallow is an examination that concentrates on views of the esophagus. This tends to be a double contrast exam (barium and two liquids which, when combined, create a gas to distend the wall of the oesophagus) or single contrast (non-ionic iodine based). The study is usually tailored to your symptoms so a good history is essential. Attention is paid during the study to the form, structure and configuration of the esophagus, looking for functional disorders (such as aspiration, dysphagia, achalasia, motility and reflux) EXAMINATION You may be asked to change into a gown, depending on the type of swallow being performed. A radiologist and radiographer will perform the procedure. The radiologist will advise you of the type of contrast selected for your procedure and direct you during the exam. You will be asked to stand, sit or lie in several different positions and to hold a small amount of fluid in your mouth before being asked to swallow while the imaging is performed .In some instances you may be asked to swallow barium coated marshmallows to assess the motility of a solid food bolus. The exam can be recorded as a digital or video fluoroscopy procedure. POST PROCEDURE It will take 1-2 days for the barium to pass through your system. To facilitate this, it is important, unless otherwise directed, to increase your fluids for the next 24-48hrs and to resume your normal diet.  This test typically takes about  30 minutes to perform. __________________________________________________________________________________  I appreciate the opportunity to care for you. Silvano Rusk, MD, [facility]

## 2019-06-23 ENCOUNTER — Other Ambulatory Visit: Payer: Self-pay | Admitting: Internal Medicine

## 2019-06-24 ENCOUNTER — Ambulatory Visit
Admission: RE | Admit: 2019-06-24 | Discharge: 2019-06-24 | Disposition: A | Discharge status: Home / Self Care | Payer: Federal, State, Local not specified - PPO | Source: Ambulatory Visit | Attending: Internal Medicine | Admitting: Internal Medicine

## 2019-06-24 DIAGNOSIS — R1319 Other dysphagia: Secondary | ICD-10-CM

## 2019-06-24 DIAGNOSIS — K222 Esophageal obstruction: Secondary | ICD-10-CM | POA: Diagnosis not present

## 2019-06-24 DIAGNOSIS — R1310 Dysphagia, unspecified: Secondary | ICD-10-CM

## 2019-06-27 ENCOUNTER — Other Ambulatory Visit: Payer: Self-pay | Admitting: Hematology & Oncology

## 2019-07-03 ENCOUNTER — Ambulatory Visit (INDEPENDENT_AMBULATORY_CARE_PROVIDER_SITE_OTHER): Payer: Medicare Other | Admitting: Obstetrics & Gynecology

## 2019-07-03 ENCOUNTER — Encounter: Payer: Self-pay | Admitting: Obstetrics & Gynecology

## 2019-07-03 ENCOUNTER — Other Ambulatory Visit: Payer: Self-pay

## 2019-07-03 VITALS — BP 117/73 | HR 65 | Ht 69.0 in | Wt 260.1 lb

## 2019-07-03 DIAGNOSIS — Z1239 Encounter for other screening for malignant neoplasm of breast: Secondary | ICD-10-CM

## 2019-07-03 DIAGNOSIS — Z01419 Encounter for gynecological examination (general) (routine) without abnormal findings: Secondary | ICD-10-CM | POA: Diagnosis not present

## 2019-07-03 NOTE — Progress Notes (Signed)
Subjective:     Jennifer [name] is a 69 y.o. female here for a routine exam. G1P1 s/p SVD.  Current complaints: none. Pt had a hyst for severe endometriosis in her late 20's. Has one ovary left.     Gynecologic History No LMP recorded. Patient has had a hysterectomy. Contraception: post menopausal status Last Pap: >5 years prev.  Last mammogram: 10/23/2018 WNL  Obstetric History OB History  Gravida Para Term Preterm AB Living  1 1 1  0 0 1  SAB TAB Ectopic Multiple Live Births  0 0 0 0 1    # Outcome Date GA Lbr Len/2nd Weight Sex Delivery Anes PTL Lv  1 Term 09/27/70 [redacted]w[redacted]d  10 lb 8 oz (4.763 kg) M Vag-Spont   LIV   The following portions of the patient's history were reviewed and updated as appropriate: allergies, current medications, past family history, past medical history, past social history, past surgical history and problem list.  Review of Systems Pertinent items are noted in HPI.    Objective:  BP 117/73   Pulse 65   Ht 5\' 9"  (1.753 m)   Wt 260 lb 1.9 oz (118 kg)   BMI 38.41 kg/m   General Appearance:    Alert, cooperative, no distress, appears stated age  Head:    Normocephalic, without obvious abnormality, atraumatic  Eyes:    conjunctiva/corneas clear, EOM's intact, both eyes  Ears:    Normal external ear canals, both ears  Nose:   Nares normal, septum midline, mucosa normal, no drainage    or sinus tenderness  Throat:   Lips, mucosa, and tongue normal; teeth and gums normal  Neck:   Supple, symmetrical, trachea midline, no adenopathy;    thyroid:  no enlargement/tenderness/nodules  Back:     Symmetric, no curvature, ROM normal, no CVA tenderness  Lungs:     respirations unlabored  Chest Wall:    No tenderness or deformity   Heart:    Regular rate and rhythm  Breast Exam:    No tenderness, masses, or nipple abnormality; well healed redxn mammoplasty incisions  Abdomen:     Soft, non-tender, bowel sounds active all four quadrants,    no masses, no organomegaly   Genitalia:    Normal female without lesion, discharge or tenderness   Uterus and cervix surgically absent   Extremities:   Extremities normal, atraumatic, no cyanosis or edema  Pulses:   2+ and symmetric all extremities  Skin:   Skin color, texture, turgor normal, no rashes or lesions    Assessment:    Healthy female exam.   Breast cancer screening.  Plan:    Pt does not have cervix. PAP smears not indicated.  Screening mammogram  F/u in 1 year or sooner prn   Trice Aspinall L. Harraway-Smith, M.D., [facility]

## 2019-07-08 ENCOUNTER — Other Ambulatory Visit: Payer: Self-pay | Admitting: *Deleted

## 2019-07-08 ENCOUNTER — Encounter: Payer: Self-pay | Admitting: *Deleted

## 2019-07-08 ENCOUNTER — Telehealth: Payer: Self-pay | Admitting: Internal Medicine

## 2019-07-08 DIAGNOSIS — R1310 Dysphagia, unspecified: Secondary | ICD-10-CM

## 2019-07-08 DIAGNOSIS — R079 Chest pain, unspecified: Secondary | ICD-10-CM

## 2019-07-08 DIAGNOSIS — R1319 Other dysphagia: Secondary | ICD-10-CM

## 2019-07-08 NOTE — Telephone Encounter (Signed)
See result note.  

## 2019-07-09 ENCOUNTER — Other Ambulatory Visit: Payer: Self-pay | Admitting: *Deleted

## 2019-07-09 DIAGNOSIS — R079 Chest pain, unspecified: Secondary | ICD-10-CM

## 2019-07-09 DIAGNOSIS — R1310 Dysphagia, unspecified: Secondary | ICD-10-CM

## 2019-07-09 DIAGNOSIS — R1319 Other dysphagia: Secondary | ICD-10-CM

## 2019-07-18 DIAGNOSIS — M1711 Unilateral primary osteoarthritis, right knee: Secondary | ICD-10-CM | POA: Diagnosis not present

## 2019-07-21 ENCOUNTER — Other Ambulatory Visit: Payer: Self-pay | Admitting: Internal Medicine

## 2019-07-21 ENCOUNTER — Telehealth: Payer: Self-pay

## 2019-07-21 DIAGNOSIS — Z01818 Encounter for other preprocedural examination: Secondary | ICD-10-CM

## 2019-07-21 NOTE — Telephone Encounter (Signed)
Received surgical clearance form from [facility]- Pt scheduled for R TKA w/ Dr. Wynelle Link on 08/25/2019. Pt has had recent CPE on 05/19/2019, last EKG 06/19/2018. Form given to PCP- please advise if surgical clearance appt is needed.

## 2019-07-23 DIAGNOSIS — M25561 Pain in right knee: Secondary | ICD-10-CM | POA: Diagnosis not present

## 2019-07-23 NOTE — Telephone Encounter (Signed)
At the last visit she had no cardiopulmonary symptoms. She has a history of pulmonary emboli, on lifelong anticoagulation. Last EKG 05/2018: NSR Advise patient: She is clear from my standpoint but also needs clearance from hematology, recommend to contact them. If she develop any symptoms such as chest pain, difficulty breathing or cough she needs to let me know.

## 2019-07-24 NOTE — Telephone Encounter (Signed)
Mychart message sent to Pt. LMOM for Jennifer [name] at [facility] requesting new surgical clearance form--(she is out of the office until Monday, April 5).

## 2019-07-28 ENCOUNTER — Other Ambulatory Visit: Payer: Self-pay | Admitting: Hematology & Oncology

## 2019-07-28 NOTE — Telephone Encounter (Signed)
Surgical clearance faxed to ATTN: Claiborne Billings at [phone]. Form sent for scanning.

## 2019-07-28 NOTE — Telephone Encounter (Signed)
Form completed, recommend clearance by [name], will send the referral

## 2019-07-28 NOTE — Telephone Encounter (Signed)
Received new form- handed to PCP.

## 2019-07-28 NOTE — Telephone Encounter (Signed)
Received fax confirmation

## 2019-08-02 ENCOUNTER — Other Ambulatory Visit (HOSPITAL_COMMUNITY)
Admission: RE | Admit: 2019-08-02 | Discharge: 2019-08-02 | Disposition: A | Discharge status: Home / Self Care | Payer: Medicare Other | Source: Ambulatory Visit | Attending: Internal Medicine | Admitting: Internal Medicine

## 2019-08-02 DIAGNOSIS — Z20822 Contact with and (suspected) exposure to covid-19: Secondary | ICD-10-CM | POA: Diagnosis not present

## 2019-08-02 DIAGNOSIS — Z01812 Encounter for preprocedural laboratory examination: Secondary | ICD-10-CM | POA: Insufficient documentation

## 2019-08-02 LAB — SARS CORONAVIRUS 2 (TAT 6-24 HRS): SARS Coronavirus 2: NEGATIVE

## 2019-08-06 ENCOUNTER — Ambulatory Visit (HOSPITAL_COMMUNITY)
Admission: RE | Admit: 2019-08-06 | Discharge: 2019-08-06 | Disposition: A | Discharge status: Home Health | Payer: Medicare Other | Attending: Internal Medicine | Admitting: Internal Medicine

## 2019-08-06 ENCOUNTER — Encounter (HOSPITAL_COMMUNITY)
Admission: RE | Disposition: A | Discharge status: Home Health | Payer: Self-pay | Source: Home / Self Care | Attending: Internal Medicine

## 2019-08-06 DIAGNOSIS — R079 Chest pain, unspecified: Secondary | ICD-10-CM | POA: Diagnosis not present

## 2019-08-06 DIAGNOSIS — R0789 Other chest pain: Secondary | ICD-10-CM | POA: Diagnosis not present

## 2019-08-06 DIAGNOSIS — R1319 Other dysphagia: Secondary | ICD-10-CM | POA: Diagnosis not present

## 2019-08-06 DIAGNOSIS — R1310 Dysphagia, unspecified: Secondary | ICD-10-CM | POA: Diagnosis not present

## 2019-08-06 HISTORY — PX: ESOPHAGEAL MANOMETRY: SHX5429

## 2019-08-06 SURGERY — MANOMETRY, ESOPHAGUS

## 2019-08-06 MED ORDER — LIDOCAINE VISCOUS HCL 2 % MT SOLN
OROMUCOSAL | Status: AC
  Filled 2019-08-06: qty 15

## 2019-08-06 SURGICAL SUPPLY — 2 items
FACESHIELD LNG OPTICON STERILE (SAFETY) IMPLANT
GLOVE BIO SURGEON STRL SZ8 (GLOVE) ×4 IMPLANT

## 2019-08-06 NOTE — Progress Notes (Signed)
Esophageal manometry performed per protocol without complications.  Patient tolerated well. 

## 2019-08-07 ENCOUNTER — Encounter: Payer: Self-pay | Admitting: *Deleted

## 2019-08-11 DIAGNOSIS — R0789 Other chest pain: Secondary | ICD-10-CM

## 2019-08-13 ENCOUNTER — Other Ambulatory Visit: Payer: Self-pay

## 2019-08-13 NOTE — Progress Notes (Signed)
Received high warning with Lexapro and amitriptyline.  I pended the order.  Please review and advise

## 2019-08-14 ENCOUNTER — Telehealth: Payer: Self-pay | Admitting: Internal Medicine

## 2019-08-14 NOTE — Patient Instructions (Addendum)
DUE TO COVID-19 ONLY ONE VISITOR IS ALLOWED TO COME WITH YOU AND STAY IN THE WAITING ROOM ONLY DURING PRE OP AND PROCEDURE DAY OF SURGERY. TWO  VISITOR MAY VISIT WITH YOU AFTER SURGERY IN YOUR PRIVATE ROOM DURING VISITING HOURS ONLY!  10a-8p  YOU NEED TO HAVE A COVID 19 TEST ON  _ 4-29-21______ @__2 :45pm_____, THIS TEST MUST BE DONE BEFORE SURGERY, COME  [place], [place] , [id number].  ([place]) ONCE YOUR COVID TEST IS COMPLETED, PLEASE BEGIN THE QUARANTINE INSTRUCTIONS AS OUTLINED IN YOUR HANDOUT.                Jennifer [name]  08/14/2019   Your procedure is scheduled on:   08-25-19   Report to [facility] Main  Entrance   Report to admitting at       1000 AM         BRING CPAP [place]     Call this number if you have problems the morning of surgery [phone]    Remember: NO SOLID FOOD AFTER MIDNIGHT THE NIGHT PRIOR TO SURGERY. NOTHING BY MOUTH EXCEPT CLEAR LIQUIDS UNTIL    0930 am . PLEASE FINISH G2 DRINK PER SURGEON ORDER  WHICH NEEDS TO BE COMPLETED AT        0930 am then nothing by mouth.    CLEAR LIQUID DIET   Foods Allowed                                                                               Foods Excluded  Coffee and tea, regular and decaf no creamer                          liquids that you cannot  Plain Jell-O any favor except red or purple                                           see through such as: Fruit ices (not with fruit pulp)                                                        milk, soups, orange juice  Iced Popsicles                                                        All solid food Carbonated beverages, regular and diet                                    Cranberry, grape and apple juices Sports drinks like Gatorade Lightly seasoned clear broth or consume(fat free)  Sugar, honey syrup   _____________________________________________________________________   BRUSH YOUR TEETH MORNING OF SURGERY  AND RINSE YOUR MOUTH OUT, NO CHEWING GUM CANDY OR MINTS.     Take these medicines the morning of surgery with A SIP OF WATER: lexapro, eye drops, aciphex  DO NOT TAKE ANY DIABETIC MEDICATIONS DAY OF YOUR SURGERY                               You may not have any metal on your body including hair pins and              piercings  Do not wear jewelry, make-up, lotions, powders or perfumes, deodorant             Do not wear nail polish on your fingernails.  Do not shave  48 hours prior to surgery.           Do not bring valuables to the hospital. [place].  Contacts, dentures or bridgework may not be worn into surgery.      Patients discharged the day of surgery will not be allowed to drive home. IF YOU ARE HAVING SURGERY AND GOING HOME THE SAME DAY, YOU MUST HAVE AN ADULT TO DRIVE YOU HOME AND BE WITH YOU FOR 24 HOURS. YOU MAY GO HOME BY TAXI OR UBER OR ORTHERWISE, BUT AN ADULT MUST ACCOMPANY YOU HOME AND STAY WITH YOU FOR 24 HOURS.  Name and phone number of your driver:  Special Instructions: N/A              Please read over the following fact sheets you were given: _____________________________________________________________________            [facility] - Preparing for Surgery Before surgery, you can play an important role.  Because skin is not sterile, your skin needs to be as free of germs as possible.  You can reduce the number of germs on your skin by washing with CHG (chlorahexidine gluconate) soap before surgery.  CHG is an antiseptic cleaner which kills germs and bonds with the skin to continue killing germs even after washing. Please DO NOT use if you have an allergy to CHG or antibacterial soaps.  If your skin becomes reddened/irritated stop using the CHG and inform your nurse when you arrive at Short Stay. Do not shave (including legs and underarms) for at least 48 hours prior to the first CHG shower.  You may shave your  face/neck. Please follow these instructions carefully:  1.  Shower with CHG Soap the night before surgery and the  morning of Surgery.  2.  If you choose to wash your hair, wash your hair first as usual with your  normal  shampoo.  3.  After you shampoo, rinse your hair and body thoroughly to remove the  shampoo.                           4.  Use CHG as you would any other liquid soap.  You can apply chg directly  to the skin and wash                       Gently with a scrungie or clean washcloth.  5.  Apply the CHG Soap to your body  ONLY FROM THE NECK DOWN.   Do not use on face/ open                           Wound or open sores. Avoid contact with eyes, ears mouth and genitals (private parts).                       Wash face,  Genitals (private parts) with your normal soap.             6.  Wash thoroughly, paying special attention to the area where your surgery  will be performed.  7.  Thoroughly rinse your body with warm water from the neck down.  8.  DO NOT shower/wash with your normal soap after using and rinsing off  the CHG Soap.                9.  Pat yourself dry with a clean towel.            10.  Wear clean pajamas.            11.  Place clean sheets on your bed the night of your first shower and do not  sleep with pets. Day of Surgery : Do not apply any lotions/deodorants the morning of surgery.  Please wear clean clothes to the hospital/surgery center.  FAILURE TO FOLLOW THESE INSTRUCTIONS MAY RESULT IN THE CANCELLATION OF YOUR SURGERY PATIENT SIGNATURE_________________________________  NURSE SIGNATURE__________________________________  ________________________________________________________________________   Jennifer [name]  An incentive spirometer is a tool that can help keep your lungs clear and active. This tool measures how well you are filling your lungs with each breath. Taking long deep breaths may help reverse or decrease the chance of developing breathing  (pulmonary) problems (especially infection) following:  A long period of time when you are unable to move or be active. BEFORE THE PROCEDURE   If the spirometer includes an indicator to show your best effort, your nurse or respiratory therapist will set it to a desired goal.  If possible, sit up straight or lean slightly forward. Try not to slouch.  Hold the incentive spirometer in an upright position. INSTRUCTIONS FOR USE  1. Sit on the edge of your bed if possible, or sit up as far as you can in bed or on a chair. 2. Hold the incentive spirometer in an upright position. 3. Breathe out normally. 4. Place the mouthpiece in your mouth and seal your lips tightly around it. 5. Breathe in slowly and as deeply as possible, raising the piston or the ball toward the top of the column. 6. Hold your breath for 3-5 seconds or for as long as possible. Allow the piston or ball to fall to the bottom of the column. 7. Remove the mouthpiece from your mouth and breathe out normally. 8. Rest for a few seconds and repeat Steps 1 through 7 at least 10 times every 1-2 hours when you are awake. Take your time and take a few normal breaths between deep breaths. 9. The spirometer may include an indicator to show your best effort. Use the indicator as a goal to work toward during each repetition. 10. After each set of 10 deep breaths, practice coughing to be sure your lungs are clear. If you have an incision (the cut made at the time of surgery), support your incision when coughing by placing a pillow or rolled up towels firmly against it. Once  you are able to get out of bed, walk around indoors and cough well. You may stop using the incentive spirometer when instructed by your caregiver.  RISKS AND COMPLICATIONS  Take your time so you do not get dizzy or light-headed.  If you are in pain, you may need to take or ask for pain medication before doing incentive spirometry. It is harder to take a deep breath if you  are having pain. AFTER USE  Rest and breathe slowly and easily.  It can be helpful to keep track of a log of your progress. Your caregiver can provide you with a simple table to help with this. If you are using the spirometer at home, follow these instructions: Ord IF:   You are having difficultly using the spirometer.  You have trouble using the spirometer as often as instructed.  Your pain medication is not giving enough relief while using the spirometer.  You develop fever of 100.5 F (38.1 C) or higher. SEEK IMMEDIATE MEDICAL CARE IF:   You cough up bloody sputum that had not been present before.  You develop fever of 102 F (38.9 C) or greater.  You develop worsening pain at or near the incision site. MAKE SURE YOU:   Understand these instructions.  Will watch your condition.  Will get help right away if you are not doing well or get worse. Document Released: 08/21/2006 Document Revised: 07/03/2011 Document Reviewed: 10/22/2006 ExitCare Patient Information 2014 ExitCare, [place].   ________________________________________________________________________  WHAT IS A BLOOD TRANSFUSION? Blood Transfusion Information  A transfusion is the replacement of blood or some of its parts. Blood is made up of multiple cells which provide different functions.  Red blood cells carry oxygen and are used for blood loss replacement.  White blood cells fight against infection.  Platelets control bleeding.  Plasma helps clot blood.  Other blood products are available for specialized needs, such as hemophilia or other clotting disorders. BEFORE THE TRANSFUSION  Who gives blood for transfusions?   Healthy volunteers who are fully evaluated to make sure their blood is safe. This is blood bank blood. Transfusion therapy is the safest it has ever been in the practice of medicine. Before blood is taken from a donor, a complete history is taken to make sure that person has  no history of diseases nor engages in risky social behavior (examples are intravenous drug use or sexual activity with multiple partners). The donor's travel history is screened to minimize risk of transmitting infections, such as malaria. The donated blood is tested for signs of infectious diseases, such as HIV and hepatitis. The blood is then tested to be sure it is compatible with you in order to minimize the chance of a transfusion reaction. If you or a relative donates blood, this is often done in anticipation of surgery and is not appropriate for emergency situations. It takes many days to process the donated blood. RISKS AND COMPLICATIONS Although transfusion therapy is very safe and saves many lives, the main dangers of transfusion include:   Getting an infectious disease.  Developing a transfusion reaction. This is an allergic reaction to something in the blood you were given. Every precaution is taken to prevent this. The decision to have a blood transfusion has been considered carefully by your caregiver before blood is given. Blood is not given unless the benefits outweigh the risks. AFTER THE TRANSFUSION  Right after receiving a blood transfusion, you will usually feel much better and more energetic. This  is especially true if your red blood cells have gotten low (anemic). The transfusion raises the level of the red blood cells which carry oxygen, and this usually causes an energy increase.  The nurse administering the transfusion will monitor you carefully for complications. HOME CARE INSTRUCTIONS  No special instructions are needed after a transfusion. You may find your energy is better. Speak with your caregiver about any limitations on activity for underlying diseases you may have. SEEK MEDICAL CARE IF:   Your condition is not improving after your transfusion.  You develop redness or irritation at the intravenous (IV) site. SEEK IMMEDIATE MEDICAL CARE IF:  Any of the following  symptoms occur over the next 12 hours:  Shaking chills.  You have a temperature by mouth above 102 F (38.9 C), not controlled by medicine.  Chest, back, or muscle pain.  People around you feel you are not acting correctly or are confused.  Shortness of breath or difficulty breathing.  Dizziness and fainting.  You get a rash or develop hives.  You have a decrease in urine output.  Your urine turns a dark color or changes to pink, red, or brown. Any of the following symptoms occur over the next 10 days:  You have a temperature by mouth above 102 F (38.9 C), not controlled by medicine.  Shortness of breath.  Weakness after normal activity.  The white part of the eye turns yellow (jaundice).  You have a decrease in the amount of urine or are urinating less often.  Your urine turns a dark color or changes to pink, red, or brown. Document Released: 04/07/2000 Document Revised: 07/03/2011 Document Reviewed: 11/25/2007 [facility] Patient Information 2014 [place], [place].  _______________________________________________________________________

## 2019-08-14 NOTE — Progress Notes (Addendum)
PCP - Jennifer [name]  Clearance on chart/ 07-28-19 pt. To see [name] 08-19-19 @ 11:00  . Clearance on chart  Cardiologist -   Chest x-ray -  EKG - 06-19-18 on chart Stress Test -  ECHO -  Cardiac Cath -   Sleep Study -  CPAP -   Fasting Blood Sugar -  Checks Blood Sugar _____ times a day  Blood Thinner Instructions:Xarelto waiting for appt with [name] for instructions  Stop xarelto 08-22-19 Aspirin Instructions: Last Dose:  Anesthesia review: osa cpap, PE  Patient denies shortness of breath, fever, cough and chest pain at PAT appointment     NONE   Patient verbalized understanding of instructions that were given to them at the PAT appointment. Patient was also instructed that they will need to review over the PAT instructions again at home before surgery.

## 2019-08-14 NOTE — Telephone Encounter (Signed)
Left message on VM re: manometry results in and to discuss + sent My Chart message

## 2019-08-15 ENCOUNTER — Other Ambulatory Visit: Payer: Self-pay

## 2019-08-15 ENCOUNTER — Encounter (HOSPITAL_COMMUNITY): Payer: Self-pay

## 2019-08-15 ENCOUNTER — Encounter (HOSPITAL_COMMUNITY)
Admission: RE | Admit: 2019-08-15 | Discharge: 2019-08-15 | Disposition: A | Discharge status: Home / Self Care | Payer: Medicare Other | Source: Ambulatory Visit | Attending: Orthopedic Surgery | Admitting: Orthopedic Surgery

## 2019-08-15 DIAGNOSIS — Z01818 Encounter for other preprocedural examination: Secondary | ICD-10-CM | POA: Diagnosis not present

## 2019-08-15 HISTORY — DX: Fibromyalgia: M79.7

## 2019-08-15 HISTORY — DX: Unspecified osteoarthritis, unspecified site: M19.90

## 2019-08-15 HISTORY — DX: Nontoxic single thyroid nodule: E04.1

## 2019-08-15 HISTORY — DX: Personal history of other diseases of the digestive system: Z87.19

## 2019-08-15 LAB — GLUCOSE, CAPILLARY: Glucose-Capillary: 89 mg/dL (ref 70–99)

## 2019-08-15 LAB — HEMOGLOBIN A1C
Hgb A1c MFr Bld: 5.9 % — ABNORMAL HIGH (ref 4.8–5.6)
Mean Plasma Glucose: 122.63 mg/dL

## 2019-08-15 LAB — CBC
HCT: 40.5 % (ref 36.0–46.0)
Hemoglobin: 12.9 g/dL (ref 12.0–15.0)
MCH: 28.3 pg (ref 26.0–34.0)
MCHC: 31.9 g/dL (ref 30.0–36.0)
MCV: 88.8 fL (ref 80.0–100.0)
Platelets: 232 10*3/uL (ref 150–400)
RBC: 4.56 MIL/uL (ref 3.87–5.11)
RDW: 15.2 % (ref 11.5–15.5)
WBC: 3.9 10*3/uL — ABNORMAL LOW (ref 4.0–10.5)
nRBC: 0 % (ref 0.0–0.2)

## 2019-08-15 LAB — COMPREHENSIVE METABOLIC PANEL
ALT: 22 U/L (ref 0–44)
AST: 25 U/L (ref 15–41)
Albumin: 4.1 g/dL (ref 3.5–5.0)
Alkaline Phosphatase: 57 U/L (ref 38–126)
Anion gap: 8 (ref 5–15)
BUN: 18 mg/dL (ref 8–23)
CO2: 28 mmol/L (ref 22–32)
Calcium: 8.8 mg/dL — ABNORMAL LOW (ref 8.9–10.3)
Chloride: 105 mmol/L (ref 98–111)
Creatinine, Ser: 0.79 mg/dL (ref 0.44–1.00)
GFR calc Af Amer: >60 mL/min (ref >60)
GFR calc non Af Amer: >60 mL/min (ref >60)
Glucose, Bld: 94 mg/dL (ref 70–99)
Potassium: 3.8 mmol/L (ref 3.5–5.1)
Sodium: 141 mmol/L (ref 135–145)
Total Bilirubin: 0.5 mg/dL (ref 0.3–1.2)
Total Protein: 7.2 g/dL (ref 6.5–8.1)

## 2019-08-15 LAB — SURGICAL PCR SCREEN
MRSA, PCR: NEGATIVE
Staphylococcus aureus: NEGATIVE

## 2019-08-15 LAB — PROTIME-INR
INR: 0.9 (ref 0.8–1.2)
Prothrombin Time: 12.4 seconds (ref 11.4–15.2)

## 2019-08-15 LAB — APTT: aPTT: 35 seconds (ref 24–36)

## 2019-08-16 LAB — ABO/RH: ABO/RH(D): AB POS

## 2019-08-19 ENCOUNTER — Encounter: Payer: Self-pay | Admitting: Hematology & Oncology

## 2019-08-19 ENCOUNTER — Other Ambulatory Visit: Payer: Self-pay

## 2019-08-19 ENCOUNTER — Inpatient Hospital Stay: Payer: Medicare Other | Attending: Hematology & Oncology | Admitting: Hematology & Oncology

## 2019-08-19 ENCOUNTER — Other Ambulatory Visit: Payer: Self-pay | Admitting: Hematology & Oncology

## 2019-08-19 VITALS — BP 136/77 | HR 81 | Temp 97.4°F | Resp 19 | Wt 256.0 lb

## 2019-08-19 DIAGNOSIS — I2601 Septic pulmonary embolism with acute cor pulmonale: Secondary | ICD-10-CM

## 2019-08-19 DIAGNOSIS — Z7901 Long term (current) use of anticoagulants: Secondary | ICD-10-CM | POA: Insufficient documentation

## 2019-08-19 DIAGNOSIS — M7989 Other specified soft tissue disorders: Secondary | ICD-10-CM | POA: Insufficient documentation

## 2019-08-19 DIAGNOSIS — I2699 Other pulmonary embolism without acute cor pulmonale: Secondary | ICD-10-CM | POA: Insufficient documentation

## 2019-08-19 NOTE — Telephone Encounter (Signed)
Discussed with her last week and learned that she is having more dysphagia than I realized and that when she has had dilations in past she had very short-lived relief of dysphagia.  So sounds like amitriptyline will not help very much  The problem, as we have suspected is likely the tightness of her Nissen fundoplication - one option would be to dilate with a bigger balloon.  Please contact her and explain this/that I have discussed with Dr. Silverio Decamp our esophageal expert and think meeting Dr. Silverio Decamp and reviewing her options for treatment is my recommendation.  I would like her to set up an appointment - she is supposed to have a knee replacement soon and would wait for that to be done and over with.

## 2019-08-19 NOTE — Progress Notes (Signed)
Hematology and Oncology Follow Up Visit  ILISSA BONNIN [id number] Nov 16, 1950 69 y.o. 08/19/2019   Principle Diagnosis:  Recurrent pulmonary embolism  Current Therapy:   Xarelto 5 mg po q day   Interim History:  Ms. Juhasz is here today for for follow-up.  She is doing okay.  She now has a definitive time for her surgery for her right knee.  She will have replacement of her right knee.  This will happen on May 3.  Her lab work looks great today.  There is no problems with her labs with respect to having surgery.  She is on Xarelto at 5 mg a day.  I told her to stop the Xarelto on 08/22/2019.  She will then restart Xarelto on 08/26/2019.  She has had no other problems.  There is been no cough or shortness of breath.  She has had no change in bowel or bladder habits.  She has had no swelling of the legs.  Her right knee looks a little swollen today.  She has had no fever.  She has been vaccinated for the coronavirus.  Overall, her performance status is ECOG 1.  Medications:  Allergies as of 08/19/2019      Reactions   Morphine And Related Anaphylaxis, Shortness Of Breath   Sulfonamide Derivatives Hives   Burn from inside out   Promethazine Hcl Other (See Comments)   Hallucinations      Medication List       Accurate as of August 19, 2019 12:06 PM. If you have any questions, ask your nurse or doctor.        acetaminophen 500 MG tablet Commonly known as: TYLENOL Take 1 tablet (500 mg total) by mouth every 6 (six) hours as needed. What changed: reasons to take this   Aciphex 20 MG tablet Generic drug: RABEprazole Take 1 tablet by mouth twice daily What changed:   how much to take  when to take this   ALPRAZolam 0.5 MG tablet Commonly known as: XANAX Take 0.5 mg by mouth at bedtime as needed for anxiety.   AMBULATORY NON FORMULARY MEDICATION Medication Name:  90 ml 2% viscous xylocaine 90 ml dicyclomine 10mg /78ml 270 mg Maalox 400mg   450 ml total  15-30 ml qid  prn   cycloSPORINE 0.05 % ophthalmic emulsion Commonly known as: RESTASIS Place 1 drop into both eyes 2 (two) times daily.   escitalopram 10 MG tablet Commonly known as: LEXAPRO Take 1 tablet (10 mg total) by mouth daily.   glucose blood test strip Check blood sugar once daily   hydrocortisone valerate cream 0.2 % Commonly known as: WESTCORT Apply 1 application topically daily as needed (eczema). eczema   hyoscyamine 0.125 MG SL tablet Commonly known as: LEVSIN SL DISSOLVE ONE TABLET IN MOUTH EVERY 4 HOURS AS NEEDED What changed:   how much to take  how to take this  when to take this  reasons to take this  additional instructions   multivitamin with minerals tablet Take 1 tablet by mouth daily. Rainbow light   OneTouch Delica Lancets 99991111 Misc Check blood sugar once daily   Prevagen 10 MG Caps Generic drug: Apoaequorin Take 10 mg by mouth daily. prevagen   temazepam 30 MG capsule Commonly known as: RESTORIL TAKE 1 CAPSULE BY MOUTH AT BEDTIME AS NEEDED FOR SLEEP What changed: See the new instructions.   triamterene-hydrochlorothiazide 37.5-25 MG tablet Commonly known as: MAXZIDE-25 Take 0.5 tablets by mouth daily.   Xarelto 2.5 MG Tabs tablet  Generic drug: rivaroxaban Take 2 tablets by mouth once daily What changed: how much to take       Allergies:  Allergies  Allergen Reactions  . Morphine And Related Anaphylaxis and Shortness Of Breath  . Sulfonamide Derivatives Hives    Burn from inside out  . Promethazine Hcl Other (See Comments)    Hallucinations    Past Medical History, Surgical history, Social history, and Family History were reviewed and updated.  Review of Systems: Review of Systems  Constitutional: Negative.   HENT: Negative.   Eyes: Negative.   Respiratory: Negative.   Cardiovascular: Negative.   Gastrointestinal: Negative.   Genitourinary: Negative.   Musculoskeletal: Negative.   Skin: Negative.   Neurological: Negative.    Endo/Heme/Allergies: Negative.   Psychiatric/Behavioral: Negative.      Physical Exam:  weight is 256 lb (116.1 kg). Her temporal temperature is 97.4 F (36.3 C) (abnormal). Her blood pressure is 136/77 and her pulse is 81. Her respiration is 19 and oxygen saturation is 93%.   Wt Readings from Last 3 Encounters:  08/19/19 256 lb (116.1 kg)  08/15/19 252 lb 5 oz (114.4 kg)  08/15/19 255 lb (115.7 kg)    Physical Exam Vitals reviewed.  HENT:     Head: Normocephalic and atraumatic.  Eyes:     Pupils: Pupils are equal, round, and reactive to light.  Cardiovascular:     Rate and Rhythm: Normal rate and regular rhythm.     Heart sounds: Normal heart sounds.  Pulmonary:     Effort: Pulmonary effort is normal.     Breath sounds: Normal breath sounds.  Abdominal:     General: Bowel sounds are normal.     Palpations: Abdomen is soft.  Musculoskeletal:        General: No tenderness or deformity. Normal range of motion.     Cervical back: Normal range of motion.  Lymphadenopathy:     Cervical: No cervical adenopathy.  Skin:    General: Skin is warm and dry.     Findings: No erythema or rash.  Neurological:     Mental Status: She is alert and oriented to person, place, and time.  Psychiatric:        Behavior: Behavior normal.        Thought Content: Thought content normal.        Judgment: Judgment normal.      Lab Results  Component Value Date   WBC 3.9 (L) 08/15/2019   HGB 12.9 08/15/2019   HCT 40.5 08/15/2019   MCV 88.8 08/15/2019   PLT 232 08/15/2019   Lab Results  Component Value Date   FERRITIN 165 08/13/2017   IRON 60 08/13/2017   TIBC 260 08/13/2017   UIBC 200 08/13/2017   IRONPCTSAT 23 08/13/2017   Lab Results  Component Value Date   RBC 4.56 08/15/2019   No results found for: KPAFRELGTCHN, LAMBDASER, KAPLAMBRATIO No results found for: IGGSERUM, IGA, IGMSERUM No results found for: [name],  SPEI   Chemistry      Component Value Date/Time   NA 141 08/15/2019 1521   NA 143 02/16/2017 1450   NA 142 01/19/2016 1141   K 3.8 08/15/2019 1521   K 3.7 02/16/2017 1450   K 3.7 01/19/2016 1141   CL 105 08/15/2019 1521   CL 105 02/16/2017 1450   CO2 28 08/15/2019 1521   CO2 32 02/16/2017 1450   CO2 27 01/19/2016 1141   BUN  18 08/15/2019 1521   BUN 14 02/16/2017 1450   BUN 19.5 01/19/2016 1141   CREATININE 0.79 08/15/2019 1521   CREATININE 0.92 05/12/2019 1204   CREATININE 1.2 02/16/2017 1450   CREATININE 0.9 01/19/2016 1141      Component Value Date/Time   CALCIUM 8.8 (L) 08/15/2019 1521   CALCIUM 8.8 02/16/2017 1450   CALCIUM 8.8 01/19/2016 1141   ALKPHOS 57 08/15/2019 1521   ALKPHOS 57 02/16/2017 1450   ALKPHOS 59 01/19/2016 1141   AST 25 08/15/2019 1521   AST 19 05/12/2019 1204   AST 21 01/19/2016 1141   ALT 22 08/15/2019 1521   ALT 16 05/12/2019 1204   ALT 27 02/16/2017 1450   ALT 18 01/19/2016 1141   BILITOT 0.5 08/15/2019 1521   BILITOT 0.3 05/12/2019 1204   BILITOT 0.31 01/19/2016 1141      Impression and Plan: Ms. Faustino is a very pleasant 69 yo African American female with history of recurrent PE. Her initial thrombus was diagnosed with her initial thrombus in 2012 and then recurred in 2015.   Again, she wanted to be on life long anticoagulation.  I think 5 mg daily would be reasonable.  I really do not see that she will have an issue with surgery.  She is only on the 5 mg of Xarelto.  We did Dopplers of her legs last year and everything looked fine with no blood clots.  I suspect she should get through surgery nicely.  She will go home the following day.  We will plan to get her back to see [place] in about 4 months.  By then, she should be well into her physical therapy and hopefully feeling a whole lot better.     [name], MD 4/27/202112:06 PM

## 2019-08-20 NOTE — Telephone Encounter (Signed)
She would like to wait until July.  She is scheduled for 7/12

## 2019-08-20 NOTE — Telephone Encounter (Signed)
Left message for patient to call back  

## 2019-08-21 ENCOUNTER — Other Ambulatory Visit (HOSPITAL_COMMUNITY)
Admission: RE | Admit: 2019-08-21 | Discharge: 2019-08-21 | Disposition: A | Discharge status: Home / Self Care | Payer: Medicare Other | Source: Ambulatory Visit | Attending: Orthopedic Surgery | Admitting: Orthopedic Surgery

## 2019-08-21 DIAGNOSIS — Z20822 Contact with and (suspected) exposure to covid-19: Secondary | ICD-10-CM | POA: Diagnosis not present

## 2019-08-21 DIAGNOSIS — Z01812 Encounter for preprocedural laboratory examination: Secondary | ICD-10-CM | POA: Insufficient documentation

## 2019-08-21 LAB — SARS CORONAVIRUS 2 (TAT 6-24 HRS): SARS Coronavirus 2: NEGATIVE

## 2019-08-21 NOTE — Progress Notes (Signed)
Anesthesia Chart Review   Case: [id number] Date/Time: 08/25/19 1224   Procedure: TOTAL KNEE ARTHROPLASTY (Right Knee) - 58min   Anesthesia type: Choice   Pre-op diagnosis: right knee osteoarthritis   Location: [facility] ROOM 10 / [facility]   Surgeons: Gaynelle Arabian, MD      DISCUSSION:68 y.o. never smoker with h/o GERD, HTN, sleep apnea w/CPAP, MVP, recurrent PE (on Xarelto), DM II, right knee OA scheduled for above procedure 08/25/2019 with Dr. Gaynelle Arabian.   Recurrent PE on life long anticoagulation followed by Dr. Burney Gauze.  Per OV note 08/19/2019, "I really do not see that she will have an issue with surgery.  She is only on the 5 mg of Xarelto.  We did Dopplers of her legs last year and everything looked fine with no blood clots.  I suspect she should get through surgery nicely.  She will go home the following day."  Advised by [name] to stop Xarelto 08/22/2019 and resume 08/26/2019.   Anticipate pt can proceed with planned procedure barring acute status change.   VS: BP (!) 147/79 (BP Location: Right Arm)   Pulse 78   Resp 18   Ht 5' 8.5" (1.74 m)   Wt 114.4 kg   SpO2 99%   BMI 37.81 kg/m   PROVIDERS: Colon Branch, MD is PCP    LABS: Labs reviewed: Acceptable for surgery. (all labs ordered are listed, but only abnormal results are displayed)  Labs Reviewed  CBC - Abnormal; Notable for the following components:      Result Value   WBC 3.9 (*)    All other components within normal limits  COMPREHENSIVE METABOLIC PANEL - Abnormal; Notable for the following components:   Calcium 8.8 (*)    All other components within normal limits  HEMOGLOBIN A1C - Abnormal; Notable for the following components:   Hgb A1c MFr Bld 5.9 (*)    All other components within normal limits  SURGICAL PCR SCREEN  APTT  PROTIME-INR  GLUCOSE, CAPILLARY  TYPE AND SCREEN  ABO/RH     IMAGES:   EKG: 08/15/2019 Rate 75 bpm Normal sinus rhythm Cannot rule out Anterior infarct , age  undetermined Abnormal ECG  CV: Echo 04/30/2008 LEFT VENTRICLE:  - Left ventricular size was normal.  - Overall left ventricular systolic function was normal.  - Left ventricular ejection fraction was estimated , range being 55     % to 60 %.  - This study was inadequate for the evaluation of left ventricular     regional wall motion.  - Left ventricular wall thickness was mildly increased.   Past Medical History:  Diagnosis Date  . Anemia   . Arthritis   . Chronic chest pain   . Chronic fatigue 02/03/2017  . Colon polyps    inflamed partially ulcerated hyperplastic polyp  . Diabetes ([place]) 09/24/2013  . Diverticulosis   . Dysphagia esophageal phase - chronic after 3 fundoplications [id number]  . Esophageal stenosis    Stricture after fundoplication REDO  . Fibromyalgia   . GERD (gastroesophageal reflux disease)   . Heart murmur    no problems   . Hemorrhoids, internal, with bleeding 06/27/2017  . History of hiatal hernia   . Hypertension   . IBS (irritable bowel syndrome)   . Mitral valve prolapse   . OSA (obstructive sleep apnea)    on CPAP-Mild - Sleep study 2004  . Pulmonary embolus ([place])    2010 and 12-2013  . Sleep apnea  wears CPAP  . Thyroid nodule      dr. watching   . Tinnitus    Subjective  . Zoster 2014   R flank    Past Surgical History:  Procedure Laterality Date  . ABDOMINAL HYSTERECTOMY     still has 1 ovary   . APPENDECTOMY    . [place]   Stem surgery, Arnold Chiari Malformation  . BREAST REDUCTION SURGERY    . CATARACT EXTRACTION Bilateral 2017   cataracts, Dr. Herbert Deaner  . ESOPHAGEAL MANOMETRY N/A 08/06/2019   Procedure: ESOPHAGEAL MANOMETRY (EM);  Surgeon: Gatha Mayer, MD;  [facility];  Service: Endoscopy;  Laterality: N/A;  . HEMORRHOID BANDING    . HERNIA REPAIR     Hital Hernia  . KNEE ARTHROSCOPY  05/16/2012   Procedure: ARTHROSCOPY KNEE;  Surgeon: Sharmon Revere, MD;  Location: [place];   Service: Orthopedics;  Laterality: Left;  . KNEE ARTHROSCOPY Right 01/2018  . NISSEN FUNDOPLICATION     X 3 lab, open x 2 last with mesh  . REDUCTION MAMMAPLASTY Bilateral 2007    MEDICATIONS: . acetaminophen (TYLENOL) 500 MG tablet  . ACIPHEX 20 MG tablet  . ALPRAZolam (XANAX) 0.5 MG tablet  . AMBULATORY NON FORMULARY MEDICATION  . Apoaequorin (PREVAGEN) 10 MG CAPS  . cycloSPORINE (RESTASIS) 0.05 % ophthalmic emulsion  . escitalopram (LEXAPRO) 10 MG tablet  . glucose blood test strip  . hydrocortisone valerate cream (WESTCORT) 0.2 %  . hyoscyamine (LEVSIN SL) 0.125 MG SL tablet  . Multiple Vitamins-Minerals (MULTIVITAMIN WITH MINERALS) tablet  . ONETOUCH DELICA LANCETS 99991111 MISC  . temazepam (RESTORIL) 30 MG capsule  . triamterene-hydrochlorothiazide (MAXZIDE-25) 37.5-25 MG tablet  . XARELTO 2.5 MG TABS tablet   No current facility-administered medications for this encounter.     Maia Plan [facility] Pre-Surgical Testing [phone] 08/21/19  11:56 AM

## 2019-08-24 ENCOUNTER — Encounter (HOSPITAL_COMMUNITY): Payer: Self-pay | Admitting: Orthopedic Surgery

## 2019-08-24 MED ORDER — BUPIVACAINE LIPOSOME 1.3 % IJ SUSP
20.0000 mL | Freq: Once | INTRAMUSCULAR | Status: DC
  Filled 2019-08-24: qty 20

## 2019-08-24 MED ORDER — TRANEXAMIC ACID 1000 MG/10ML IV SOLN
2000.0000 mg | Freq: Once | INTRAVENOUS | Status: DC
  Filled 2019-08-24: qty 20

## 2019-08-24 NOTE — H&P (Signed)
TOTAL KNEE ADMISSION H&P  Patient is being admitted for right total knee arthroplasty.  Subjective:  Chief Complaint:right knee pain.  HPI: Jennifer [name], 69 y.o. female, has a history of pain and functional disability in the right knee due to arthritis and has failed non-surgical conservative treatments for greater than 12 weeks to includecorticosteriod injections, viscosupplementation injections and activity modification.  Onset of symptoms was gradual, starting 3 years ago with gradually worsening course since that time. The patient noted prior procedures on the knee to include  arthroscopy and menisectomy on the right knee(s).  Patient currently rates pain in the right knee(s) at 8 out of 10 with activity. Patient has worsening of pain with activity and weight bearing and pain that interferes with activities of daily living.  Patient has evidence of joint space narrowing by imaging studies. There is no active infection.  Patient Active Problem List   Diagnosis Date Noted  . Atypical chest pain   . Morbid obesity ([place]) 05/08/2018  . Hemorrhoids, internal, with bleeding 06/27/2017  . IDA (iron deficiency anemia) 02/20/2017  . Chronic fatigue 02/03/2017  . Left leg pain 11/29/2016  . History of colonic polyps 12/02/2015  . PCP NOTES >>>>>>>>>>>>>>>>>>>>>>>>> 02/13/2015  . Anxiety 08/04/2014  . Annual physical exam 04/29/2014  . Fibromyalgia syndrome 02/04/2014  . Dysphagia 01/20/2014  . DOE (dyspnea on exertion) 12/26/2013  . Diabetes ([place]) 09/24/2013  . Abdominal pain, chronic, right flank -upper quadrant 07/02/2012  . Morbid obesity due to excess calories ([place]) 09/30/2009  . Hypersomnia with sleep apnea 09/28/2009  . Essential hypertension 06/24/2009  . DIZZINESS 06/24/2009  . HEADACHE 06/24/2009  . VENOUS INSUFFICIENCY, LEGS 11/19/2008  . Acute pulmonary embolism ([place]) 05/19/2008  . Pulmonary nodule 08/26/2007  . Stricture and stenosis of esophagus 05/22/2007  . GERD 01/31/2007   . IBS (irritable bowel syndrome) 01/31/2007  . MITRAL VALVE PROLAPSE, HX OF 01/31/2007   Past Medical History:  Diagnosis Date  . Anemia   . Arthritis   . Chronic chest pain   . Chronic fatigue 02/03/2017  . Colon polyps    inflamed partially ulcerated hyperplastic polyp  . Diabetes ([place]) 09/24/2013  . Diverticulosis   . Dysphagia esophageal phase - chronic after 3 fundoplications [id number]  . Esophageal stenosis    Stricture after fundoplication REDO  . Fibromyalgia   . GERD (gastroesophageal reflux disease)   . Heart murmur    no problems   . Hemorrhoids, internal, with bleeding 06/27/2017  . History of hiatal hernia   . Hypertension   . IBS (irritable bowel syndrome)   . Mitral valve prolapse   . OSA (obstructive sleep apnea)    on CPAP-Mild - Sleep study 2004  . Pulmonary embolus ([place])    2010 and 12-2013  . Sleep apnea    wears CPAP  . Thyroid nodule      dr. watching   . Tinnitus    Subjective  . Zoster 2014   R flank    Past Surgical History:  Procedure Laterality Date  . ABDOMINAL HYSTERECTOMY     still has 1 ovary   . APPENDECTOMY    . [place]   Stem surgery, Arnold Chiari Malformation  . BREAST REDUCTION SURGERY    . CATARACT EXTRACTION Bilateral 2017   cataracts, Dr. Herbert Deaner  . ESOPHAGEAL MANOMETRY N/A 08/06/2019   Procedure: ESOPHAGEAL MANOMETRY (EM);  Surgeon: Gatha Mayer, MD;  [facility];  Service: Endoscopy;  Laterality: N/A;  . HEMORRHOID  BANDING    . HERNIA REPAIR     Hital Hernia  . KNEE ARTHROSCOPY  05/16/2012   Procedure: ARTHROSCOPY KNEE;  Surgeon: Sharmon Revere, MD;  Location: [place];  Service: Orthopedics;  Laterality: Left;  . KNEE ARTHROSCOPY Right 01/2018  . NISSEN FUNDOPLICATION     X 3 lab, open x 2 last with mesh  . REDUCTION MAMMAPLASTY Bilateral 2007    Current Facility-Administered Medications  Medication Dose Route Frequency Provider Last Rate Last Admin  . [START ON 08/25/2019] bupivacaine liposome  (EXPAREL) 1.3 % injection 266 mg  20 mL Other Once Gaynelle Arabian, MD      . Derrill Memo ON 08/25/2019] tranexamic acid (CYKLOKAPRON) 2,000 mg in sodium chloride 0.9 % 50 mL Topical Application  [id number] mg Topical Once Gaynelle Arabian, MD       Current Outpatient Medications  Medication Sig Dispense Refill Last Dose  . acetaminophen (TYLENOL) 500 MG tablet Take 1 tablet (500 mg total) by mouth every 6 (six) hours as needed. (Patient taking differently: Take 500 mg by mouth every 6 (six) hours as needed for mild pain or fever. ) 30 tablet 0   . ACIPHEX 20 MG tablet Take 1 tablet by mouth twice daily (Patient taking differently: Take 20 mg by mouth in the morning and at bedtime. ) 60 tablet 3   . Apoaequorin (PREVAGEN) 10 MG CAPS Take 10 mg by mouth daily. prevagen     . cycloSPORINE (RESTASIS) 0.05 % ophthalmic emulsion Place 1 drop into both eyes 2 (two) times daily.      Marland Kitchen escitalopram (LEXAPRO) 10 MG tablet Take 1 tablet (10 mg total) by mouth daily. 90 tablet 1   . hydrocortisone valerate cream (WESTCORT) 0.2 % Apply 1 application topically daily as needed (eczema). eczema     . hyoscyamine (LEVSIN SL) 0.125 MG SL tablet DISSOLVE ONE TABLET IN MOUTH EVERY 4 HOURS AS NEEDED (Patient taking differently: Take 0.125 mg by mouth every 4 (four) hours as needed for cramping. ) 90 tablet 3   . Multiple Vitamins-Minerals (MULTIVITAMIN WITH MINERALS) tablet Take 1 tablet by mouth daily. Rainbow light     . temazepam (RESTORIL) 30 MG capsule TAKE 1 CAPSULE BY MOUTH AT BEDTIME AS NEEDED FOR SLEEP (Patient taking differently: Take 30 mg by mouth at bedtime. ) 30 capsule 3   . triamterene-hydrochlorothiazide (MAXZIDE-25) 37.5-25 MG tablet Take 0.5 tablets by mouth daily. 45 tablet 1   . ALPRAZolam (XANAX) 0.5 MG tablet Take 0.5 mg by mouth at bedtime as needed for anxiety.     . AMBULATORY NON FORMULARY MEDICATION Medication Name:  90 ml 2% viscous xylocaine 90 ml dicyclomine 10mg /79ml 270 mg Maalox 400mg   450 ml  total  15-30 ml qid prn (Patient not taking: Reported on 07/03/2019) 450 mL 1   . glucose blood test strip Check blood sugar once daily 100 each 12   . ONETOUCH DELICA LANCETS 99991111 MISC Check blood sugar once daily 100 each 12   . XARELTO 2.5 MG TABS tablet Take 2 tablets by mouth once daily 60 tablet 0    Allergies  Allergen Reactions  . Morphine And Related Anaphylaxis and Shortness Of Breath  . Sulfonamide Derivatives Hives    Burn from inside out  . Promethazine Hcl Other (See Comments)    Hallucinations    Social History   Tobacco Use  . Smoking status: Never Smoker  . Smokeless tobacco: Never Used  . Tobacco comment: never used tobacco  Substance  Use Topics  . Alcohol use: No    Alcohol/week: 0.0 standard drinks    Family History  Problem Relation Age of Onset  . Diabetes Mother   . Rheum arthritis Mother   . Hypertension Mother   . Hypertension Father   . Rheum arthritis Sister   . Hypertension Sister   . Colon cancer Maternal Grandmother   . Rheum arthritis Sister   . Rheum arthritis Sister   . Rheum arthritis Sister   . CAD Sister        MIdx age 85  . Stomach cancer Maternal Uncle   . Breast cancer Neg Hx   . Esophageal cancer Neg Hx   . Rectal cancer Neg Hx      Review of Systems  Constitutional: Negative for chills and fever.  Respiratory: Negative for shortness of breath.   Cardiovascular: Negative for chest pain.  Gastrointestinal: Negative for nausea and vomiting.  Musculoskeletal: Positive for arthralgias.    Objective:  Physical Exam Patient is a 69 year old female.  Well nourished and well developed. General: Alert and oriented x3, cooperative and pleasant, no acute distress. Head: normocephalic, atraumatic, neck supple. Eyes: EOMI. Respiratory: breath sounds clear in all fields, no wheezing, rales, or rhonchi. Cardiovascular: Regular rate and rhythm, no murmurs, gallops or rubs. Abdomen: non-tender to palpation and soft, normoactive  bowel sounds.  Musculoskeletal: Right Knee Exam: No effusion present. No swelling present. The range of motion is: 5 to 125 degrees. Moderate crepitus on range of motion of the knee. Lateral greater than medial joint line tenderness. The knee is stable.  Calves soft and nontender. Motor function intact in LE. Strength 5/5 LE bilaterally. Neuro: Distal pulses 2+. Sensation to light touch intact in LE.  Vital signs in last 24 hours:    Labs:   Estimated body mass index is 38.36 kg/m as calculated from the following:   Height as of 08/15/19: 5' 8.5" (1.74 m).   Weight as of 08/19/19: 116.1 kg.   Imaging Review Plain radiographs demonstrate severe degenerative joint disease of the right knee(s). The overall alignment isneutral. The bone quality appears to be adequate for age and reported activity level.      Assessment/Plan:  End stage arthritis, right knee   The patient history, physical examination, clinical judgment of the provider and imaging studies are consistent with end stage degenerative joint disease of the right knee(s) and total knee arthroplasty is deemed medically necessary. The treatment options including medical management, injection therapy arthroscopy and arthroplasty were discussed at length. The risks and benefits of total knee arthroplasty were presented and reviewed. The risks due to aseptic loosening, infection, stiffness, patella tracking problems, thromboembolic complications and other imponderables were discussed. The patient acknowledged the explanation, agreed to proceed with the plan and consent was signed. Patient is being admitted for inpatient treatment for surgery, pain control, PT, OT, prophylactic antibiotics, VTE prophylaxis, progressive ambulation and ADL's and discharge planning. The patient is planning to be discharged home.  Therapy Plans: outpatient therapy at [facility] Disposition: Home with Planned DVT Prophylaxis: Xarelto 10mg  daily (Hx  of DVT/PE) DME needed: walker, 3-n-1 PCP: Dr. Larose Kells, clearance received Hematology: [name]  TXA: IV Allergies: morphine - difficulty breathing, phenergan - nightmares, sulfa drugs - rash Anesthesia Concerns: none BMI: 39.4 Last HgbA1c: 6.2%  Other: Takes Xarelto 5 mg daily. Planning to stay the night. She thinks she tolerates Oxycodone, but will check records.     - Patient was instructed on what medications to  stop prior to surgery. - Follow-up visit in 2 weeks with Dr. Wynelle Link - Begin physical therapy following surgery - Pre-operative lab work as pre-surgical testing - Prescriptions will be provided in hospital at time of discharge   Patient's anticipated LOS is less than 2 midnights, meeting these requirements: - Younger than 27 - Lives within 1 hour of care - Has a competent adult at home to recover with post-op recover - NO history of  - Chronic pain requiring opiods  - Diabetes  - Coronary Artery Disease  - Heart failure  - Heart attack  - Stroke  - DVT/VTE  - Cardiac arrhythmia  - Respiratory Failure/COPD  - Renal failure  - Anemia  - Advanced Liver disease  Griffith Citron, PA-C Orthopedic Surgery [facility] [place] [phone]

## 2019-08-25 ENCOUNTER — Inpatient Hospital Stay (HOSPITAL_COMMUNITY)
Admission: RE | Admit: 2019-08-25 | Discharge: 2019-08-30 | DRG: 470 | Disposition: A | Discharge status: Home / Self Care | Payer: Medicare Other | Source: Ambulatory Visit | Attending: Orthopedic Surgery | Admitting: Orthopedic Surgery

## 2019-08-25 ENCOUNTER — Encounter (HOSPITAL_COMMUNITY)
Admission: RE | Disposition: A | Discharge status: Home / Self Care | Payer: Self-pay | Source: Ambulatory Visit | Attending: Orthopedic Surgery

## 2019-08-25 ENCOUNTER — Other Ambulatory Visit: Payer: Self-pay

## 2019-08-25 ENCOUNTER — Encounter (HOSPITAL_COMMUNITY): Payer: Self-pay | Admitting: Orthopedic Surgery

## 2019-08-25 ENCOUNTER — Inpatient Hospital Stay (HOSPITAL_COMMUNITY): Payer: Medicare Other | Admitting: Physician Assistant

## 2019-08-25 ENCOUNTER — Inpatient Hospital Stay (HOSPITAL_COMMUNITY): Payer: Medicare Other | Admitting: Anesthesiology

## 2019-08-25 DIAGNOSIS — I10 Essential (primary) hypertension: Secondary | ICD-10-CM | POA: Diagnosis present

## 2019-08-25 DIAGNOSIS — Z8261 Family history of arthritis: Secondary | ICD-10-CM

## 2019-08-25 DIAGNOSIS — E119 Type 2 diabetes mellitus without complications: Secondary | ICD-10-CM | POA: Diagnosis present

## 2019-08-25 DIAGNOSIS — Z888 Allergy status to other drugs, medicaments and biological substances status: Secondary | ICD-10-CM

## 2019-08-25 DIAGNOSIS — M659 Synovitis and tenosynovitis, unspecified: Secondary | ICD-10-CM | POA: Diagnosis present

## 2019-08-25 DIAGNOSIS — M1710 Unilateral primary osteoarthritis, unspecified knee: Secondary | ICD-10-CM

## 2019-08-25 DIAGNOSIS — Z86711 Personal history of pulmonary embolism: Secondary | ICD-10-CM | POA: Diagnosis not present

## 2019-08-25 DIAGNOSIS — I341 Nonrheumatic mitral (valve) prolapse: Secondary | ICD-10-CM | POA: Diagnosis not present

## 2019-08-25 DIAGNOSIS — Z6838 Body mass index (BMI) 38.0-38.9, adult: Secondary | ICD-10-CM | POA: Diagnosis not present

## 2019-08-25 DIAGNOSIS — G4733 Obstructive sleep apnea (adult) (pediatric): Secondary | ICD-10-CM | POA: Diagnosis present

## 2019-08-25 DIAGNOSIS — Z833 Family history of diabetes mellitus: Secondary | ICD-10-CM

## 2019-08-25 DIAGNOSIS — Z9842 Cataract extraction status, left eye: Secondary | ICD-10-CM

## 2019-08-25 DIAGNOSIS — Z885 Allergy status to narcotic agent status: Secondary | ICD-10-CM

## 2019-08-25 DIAGNOSIS — M797 Fibromyalgia: Secondary | ICD-10-CM | POA: Diagnosis present

## 2019-08-25 DIAGNOSIS — M1711 Unilateral primary osteoarthritis, right knee: Secondary | ICD-10-CM | POA: Diagnosis not present

## 2019-08-25 DIAGNOSIS — Z8249 Family history of ischemic heart disease and other diseases of the circulatory system: Secondary | ICD-10-CM

## 2019-08-25 DIAGNOSIS — Z96651 Presence of right artificial knee joint: Secondary | ICD-10-CM

## 2019-08-25 DIAGNOSIS — M179 Osteoarthritis of knee, unspecified: Secondary | ICD-10-CM | POA: Diagnosis present

## 2019-08-25 DIAGNOSIS — Q0700 Arnold-Chiari syndrome without spina bifida or hydrocephalus: Secondary | ICD-10-CM

## 2019-08-25 DIAGNOSIS — Z9841 Cataract extraction status, right eye: Secondary | ICD-10-CM

## 2019-08-25 DIAGNOSIS — Z882 Allergy status to sulfonamides status: Secondary | ICD-10-CM

## 2019-08-25 DIAGNOSIS — G8918 Other acute postprocedural pain: Secondary | ICD-10-CM | POA: Diagnosis not present

## 2019-08-25 DIAGNOSIS — F419 Anxiety disorder, unspecified: Secondary | ICD-10-CM | POA: Diagnosis present

## 2019-08-25 DIAGNOSIS — K219 Gastro-esophageal reflux disease without esophagitis: Secondary | ICD-10-CM | POA: Diagnosis not present

## 2019-08-25 DIAGNOSIS — Z79899 Other long term (current) drug therapy: Secondary | ICD-10-CM | POA: Diagnosis not present

## 2019-08-25 HISTORY — PX: TOTAL KNEE ARTHROPLASTY: SHX125

## 2019-08-25 LAB — TYPE AND SCREEN
ABO/RH(D): AB POS
Antibody Screen: NEGATIVE

## 2019-08-25 LAB — GLUCOSE, CAPILLARY: Glucose-Capillary: 91 mg/dL (ref 70–99)

## 2019-08-25 SURGERY — ARTHROPLASTY, KNEE, TOTAL
Anesthesia: Spinal | Site: Knee | Laterality: Right

## 2019-08-25 MED ORDER — SODIUM CHLORIDE 0.9 % IR SOLN
Status: DC | PRN
  Administered 2019-08-25: 1000 mL

## 2019-08-25 MED ORDER — SODIUM CHLORIDE (PF) 0.9 % IJ SOLN
INTRAMUSCULAR | Status: DC | PRN
  Administered 2019-08-25: 60 mL

## 2019-08-25 MED ORDER — CYCLOSPORINE 0.05 % OP EMUL
1.0000 [drp] | Freq: Two times a day (BID) | OPHTHALMIC | Status: DC
  Administered 2019-08-25 – 2019-08-30 (×10): 1 [drp] via OPHTHALMIC
  Filled 2019-08-25 (×10): qty 1

## 2019-08-25 MED ORDER — HYDROMORPHONE HCL 1 MG/ML IJ SOLN: 0.2500 mg | INTRAMUSCULAR | Status: DC | PRN

## 2019-08-25 MED ORDER — HYDROMORPHONE HCL 1 MG/ML IJ SOLN
0.5000 mg | INTRAMUSCULAR | Status: DC | PRN
  Administered 2019-08-25 (×2): 1 mg via INTRAVENOUS
  Filled 2019-08-25 (×2): qty 1

## 2019-08-25 MED ORDER — TRIAMTERENE-HCTZ 37.5-25 MG PO TABS
0.5000 | ORAL_TABLET | Freq: Every day | ORAL | Status: DC
  Administered 2019-08-26 – 2019-08-30 (×5): 0.5 via ORAL
  Filled 2019-08-25 (×5): qty 1

## 2019-08-25 MED ORDER — ACETAMINOPHEN 500 MG PO TABS
1000.0000 mg | ORAL_TABLET | Freq: Four times a day (QID) | ORAL | Status: AC
  Administered 2019-08-25 – 2019-08-26 (×4): 1000 mg via ORAL
  Filled 2019-08-25 (×4): qty 2

## 2019-08-25 MED ORDER — MIDAZOLAM HCL 2 MG/2ML IJ SOLN
1.0000 mg | INTRAMUSCULAR | Status: DC
  Administered 2019-08-25: 1 mg via INTRAVENOUS
  Filled 2019-08-25: qty 2

## 2019-08-25 MED ORDER — TRAMADOL HCL 50 MG PO TABS
50.0000 mg | ORAL_TABLET | Freq: Four times a day (QID) | ORAL | Status: DC | PRN
  Administered 2019-08-25 – 2019-08-30 (×12): 100 mg via ORAL
  Filled 2019-08-25 (×13): qty 2

## 2019-08-25 MED ORDER — MIDAZOLAM HCL 5 MG/5ML IJ SOLN
INTRAMUSCULAR | Status: DC | PRN
  Administered 2019-08-25: 2 mg via INTRAVENOUS

## 2019-08-25 MED ORDER — 0.9 % SODIUM CHLORIDE (POUR BTL) OPTIME
TOPICAL | Status: DC | PRN
  Administered 2019-08-25: 1000 mL

## 2019-08-25 MED ORDER — DOCUSATE SODIUM 100 MG PO CAPS
100.0000 mg | ORAL_CAPSULE | Freq: Two times a day (BID) | ORAL | Status: DC
  Administered 2019-08-25 – 2019-08-30 (×10): 100 mg via ORAL
  Filled 2019-08-25 (×10): qty 1

## 2019-08-25 MED ORDER — ACETAMINOPHEN 10 MG/ML IV SOLN
1000.0000 mg | Freq: Four times a day (QID) | INTRAVENOUS | Status: DC
  Administered 2019-08-25: 1000 mg via INTRAVENOUS
  Filled 2019-08-25: qty 100

## 2019-08-25 MED ORDER — METHOCARBAMOL 500 MG PO TABS
500.0000 mg | ORAL_TABLET | Freq: Four times a day (QID) | ORAL | Status: DC | PRN
  Administered 2019-08-25 – 2019-08-28 (×5): 500 mg via ORAL
  Filled 2019-08-25 (×5): qty 1

## 2019-08-25 MED ORDER — PHENYLEPHRINE HCL-NACL 10-0.9 MG/250ML-% IV SOLN
INTRAVENOUS | Status: DC | PRN
  Administered 2019-08-25: 25 ug/min via INTRAVENOUS

## 2019-08-25 MED ORDER — METHOCARBAMOL 1000 MG/10ML IJ SOLN
500.0000 mg | Freq: Four times a day (QID) | INTRAVENOUS | Status: DC | PRN
  Filled 2019-08-25: qty 5

## 2019-08-25 MED ORDER — OXYCODONE HCL 5 MG PO TABS
5.0000 mg | ORAL_TABLET | ORAL | Status: DC | PRN
  Administered 2019-08-25 (×2): 10 mg via ORAL
  Administered 2019-08-26: 5 mg via ORAL
  Administered 2019-08-26 (×2): 10 mg via ORAL
  Administered 2019-08-26: 5 mg via ORAL
  Administered 2019-08-27 (×3): 10 mg via ORAL
  Filled 2019-08-25 (×6): qty 2
  Filled 2019-08-25: qty 1
  Filled 2019-08-25 (×2): qty 2

## 2019-08-25 MED ORDER — BUPIVACAINE IN DEXTROSE 0.75-8.25 % IT SOLN
INTRATHECAL | Status: DC | PRN
Start: 2019-08-25 — End: 2019-08-25
  Administered 2019-08-25: 1.7 mL via INTRATHECAL

## 2019-08-25 MED ORDER — DIPHENHYDRAMINE HCL 12.5 MG/5ML PO ELIX
12.5000 mg | ORAL_SOLUTION | ORAL | Status: DC | PRN
  Administered 2019-08-25: 25 mg via ORAL
  Administered 2019-08-26: 12.5 mg via ORAL
  Filled 2019-08-25: qty 5
  Filled 2019-08-25: qty 10

## 2019-08-25 MED ORDER — PROPOFOL 500 MG/50ML IV EMUL
INTRAVENOUS | Status: DC | PRN
  Administered 2019-08-25: 100 ug/kg/min via INTRAVENOUS

## 2019-08-25 MED ORDER — RIVAROXABAN 10 MG PO TABS
10.0000 mg | ORAL_TABLET | Freq: Every day | ORAL | Status: DC
  Administered 2019-08-26 – 2019-08-30 (×5): 10 mg via ORAL
  Filled 2019-08-25 (×5): qty 1

## 2019-08-25 MED ORDER — BUPIVACAINE HCL (PF) 0.5 % IJ SOLN
INTRAMUSCULAR | Status: DC | PRN
Start: 2019-08-25 — End: 2019-08-25
  Administered 2019-08-25: 20 mL via PERINEURAL

## 2019-08-25 MED ORDER — LIDOCAINE 2% (20 MG/ML) 5 ML SYRINGE
INTRAMUSCULAR | Status: DC | PRN
  Administered 2019-08-25 (×2): 50 mg via INTRAVENOUS

## 2019-08-25 MED ORDER — CEFAZOLIN SODIUM-DEXTROSE 2-4 GM/100ML-% IV SOLN
2.0000 g | Freq: Four times a day (QID) | INTRAVENOUS | Status: AC
  Administered 2019-08-25 – 2019-08-26 (×2): 2 g via INTRAVENOUS
  Filled 2019-08-25 (×2): qty 100

## 2019-08-25 MED ORDER — METOCLOPRAMIDE HCL 5 MG/ML IJ SOLN
5.0000 mg | Freq: Three times a day (TID) | INTRAMUSCULAR | Status: DC | PRN
  Administered 2019-08-25 – 2019-08-26 (×2): 10 mg via INTRAVENOUS
  Filled 2019-08-25 (×2): qty 2

## 2019-08-25 MED ORDER — MIDAZOLAM HCL 2 MG/2ML IJ SOLN
INTRAMUSCULAR | Status: AC
  Filled 2019-08-25: qty 2

## 2019-08-25 MED ORDER — POVIDONE-IODINE 10 % EX SWAB
2.0000 "application " | Freq: Once | CUTANEOUS | Status: AC
  Administered 2019-08-25: 2 via TOPICAL

## 2019-08-25 MED ORDER — POLYETHYLENE GLYCOL 3350 17 G PO PACK
17.0000 g | PACK | Freq: Every day | ORAL | Status: DC | PRN
  Administered 2019-08-30: 17 g via ORAL
  Filled 2019-08-25: qty 1

## 2019-08-25 MED ORDER — LACTATED RINGERS IV SOLN: INTRAVENOUS | Status: DC

## 2019-08-25 MED ORDER — GABAPENTIN 300 MG PO CAPS
300.0000 mg | ORAL_CAPSULE | Freq: Three times a day (TID) | ORAL | Status: DC
  Administered 2019-08-25 – 2019-08-28 (×10): 300 mg via ORAL
  Filled 2019-08-25 (×10): qty 1

## 2019-08-25 MED ORDER — MENTHOL 3 MG MT LOZG: 1.0000 | LOZENGE | OROMUCOSAL | Status: DC | PRN

## 2019-08-25 MED ORDER — ONDANSETRON HCL 4 MG PO TABS
4.0000 mg | ORAL_TABLET | Freq: Four times a day (QID) | ORAL | Status: DC | PRN
  Administered 2019-08-26 – 2019-08-27 (×2): 4 mg via ORAL
  Filled 2019-08-25 (×3): qty 1

## 2019-08-25 MED ORDER — ONDANSETRON HCL 4 MG/2ML IJ SOLN
INTRAMUSCULAR | Status: DC | PRN
  Administered 2019-08-25: 4 mg via INTRAVENOUS

## 2019-08-25 MED ORDER — DEXAMETHASONE SODIUM PHOSPHATE 10 MG/ML IJ SOLN
INTRAMUSCULAR | Status: DC | PRN
  Administered 2019-08-25: 8 mg via INTRAVENOUS

## 2019-08-25 MED ORDER — SODIUM CHLORIDE (PF) 0.9 % IJ SOLN
INTRAMUSCULAR | Status: AC
  Filled 2019-08-25: qty 10

## 2019-08-25 MED ORDER — PHENOL 1.4 % MT LIQD: 1.0000 | OROMUCOSAL | Status: DC | PRN

## 2019-08-25 MED ORDER — ESCITALOPRAM OXALATE 10 MG PO TABS
10.0000 mg | ORAL_TABLET | Freq: Every day | ORAL | Status: DC
  Administered 2019-08-26 – 2019-08-30 (×5): 10 mg via ORAL
  Filled 2019-08-25 (×5): qty 1

## 2019-08-25 MED ORDER — TEMAZEPAM 15 MG PO CAPS
30.0000 mg | ORAL_CAPSULE | Freq: Every day | ORAL | Status: DC
  Administered 2019-08-25 – 2019-08-26 (×2): 30 mg via ORAL
  Filled 2019-08-25 (×3): qty 2

## 2019-08-25 MED ORDER — SODIUM CHLORIDE (PF) 0.9 % IJ SOLN
INTRAMUSCULAR | Status: AC
  Filled 2019-08-25: qty 50

## 2019-08-25 MED ORDER — ONDANSETRON HCL 4 MG/2ML IJ SOLN
4.0000 mg | Freq: Four times a day (QID) | INTRAMUSCULAR | Status: DC | PRN
  Administered 2019-08-25 – 2019-08-27 (×3): 4 mg via INTRAVENOUS
  Filled 2019-08-25 (×3): qty 2

## 2019-08-25 MED ORDER — CEFAZOLIN SODIUM-DEXTROSE 2-4 GM/100ML-% IV SOLN
2.0000 g | INTRAVENOUS | Status: AC
  Administered 2019-08-25: 2 g via INTRAVENOUS
  Filled 2019-08-25: qty 100

## 2019-08-25 MED ORDER — DEXAMETHASONE SODIUM PHOSPHATE 10 MG/ML IJ SOLN
10.0000 mg | Freq: Once | INTRAMUSCULAR | Status: AC
  Administered 2019-08-26: 10 mg via INTRAVENOUS
  Filled 2019-08-25: qty 1

## 2019-08-25 MED ORDER — FENTANYL CITRATE (PF) 100 MCG/2ML IJ SOLN
50.0000 ug | INTRAMUSCULAR | Status: DC
  Administered 2019-08-25: 50 ug via INTRAVENOUS
  Filled 2019-08-25: qty 2

## 2019-08-25 MED ORDER — SODIUM CHLORIDE 0.9 % IV SOLN: INTRAVENOUS | Status: DC

## 2019-08-25 MED ORDER — DEXAMETHASONE SODIUM PHOSPHATE 10 MG/ML IJ SOLN: 8.0000 mg | Freq: Once | INTRAMUSCULAR | Status: DC

## 2019-08-25 MED ORDER — HYOSCYAMINE SULFATE 0.125 MG SL SUBL
0.1250 mg | SUBLINGUAL_TABLET | SUBLINGUAL | Status: DC | PRN
  Filled 2019-08-25: qty 1

## 2019-08-25 MED ORDER — TRANEXAMIC ACID-NACL 1000-0.7 MG/100ML-% IV SOLN
INTRAVENOUS | Status: AC | PRN
  Administered 2019-08-25: 2000 mg via INTRAVENOUS

## 2019-08-25 MED ORDER — METOCLOPRAMIDE HCL 5 MG PO TABS
5.0000 mg | ORAL_TABLET | Freq: Three times a day (TID) | ORAL | Status: DC | PRN
  Administered 2019-08-27: 10 mg via ORAL
  Filled 2019-08-25: qty 2

## 2019-08-25 MED ORDER — PANTOPRAZOLE SODIUM 40 MG PO TBEC
40.0000 mg | DELAYED_RELEASE_TABLET | Freq: Every day | ORAL | Status: DC
  Administered 2019-08-26 – 2019-08-30 (×5): 40 mg via ORAL
  Filled 2019-08-25 (×5): qty 1

## 2019-08-25 MED ORDER — BUPIVACAINE LIPOSOME 1.3 % IJ SUSP
INTRAMUSCULAR | Status: DC | PRN
  Administered 2019-08-25: 20 mL

## 2019-08-25 MED ORDER — BISACODYL 10 MG RE SUPP: 10.0000 mg | Freq: Every day | RECTAL | Status: DC | PRN

## 2019-08-25 MED ORDER — FLEET ENEMA 7-19 GM/118ML RE ENEM: 1.0000 | ENEMA | Freq: Once | RECTAL | Status: DC | PRN

## 2019-08-25 MED ORDER — PROPOFOL 10 MG/ML IV BOLUS
INTRAVENOUS | Status: DC | PRN
  Administered 2019-08-25: 30 mg via INTRAVENOUS

## 2019-08-25 SURGICAL SUPPLY — 58 items
ATTUNE MED DOME PAT 41 KNEE (Knees) ×1 IMPLANT
ATTUNE PS FEM RT SZ 7 CEM KNEE (Femur) ×1 IMPLANT
ATTUNE PSRP INSR SZ7 8 KNEE (Insert) ×1 IMPLANT
BAG SPEC THK2 15X12 ZIP CLS (MISCELLANEOUS) ×1
BAG ZIPLOCK 12X15 (MISCELLANEOUS) ×2 IMPLANT
BASE TIBIAL ROT PLAT SZ 7 KNEE (Knees) IMPLANT
BLADE SAG 18X100X1.27 (BLADE) ×2 IMPLANT
BLADE SAW SGTL 11.0X1.19X90.0M (BLADE) ×2 IMPLANT
BLADE SURG SZ10 CARB STEEL (BLADE) ×4 IMPLANT
BNDG ELASTIC 6X5.8 VLCR STR LF (GAUZE/BANDAGES/DRESSINGS) ×2 IMPLANT
BOWL SMART MIX CTS (DISPOSABLE) ×2 IMPLANT
BSPLAT TIB 7 CMNT ROT PLAT STR (Knees) ×1 IMPLANT
CEMENT HV SMART SET (Cement) ×3 IMPLANT
COVER SURGICAL LIGHT HANDLE (MISCELLANEOUS) ×2 IMPLANT
COVER WAND RF STERILE (DRAPES) ×1 IMPLANT
CUFF TOURN SGL QUICK 34 (TOURNIQUET CUFF)
CUFF TOURN SGL QUICK 42 (TOURNIQUET CUFF) ×1 IMPLANT
CUFF TRNQT CYL 34X4.125X (TOURNIQUET CUFF) ×1 IMPLANT
DECANTER SPIKE VIAL GLASS SM (MISCELLANEOUS) ×2 IMPLANT
DRAPE U-SHAPE 47X51 STRL (DRAPES) ×2 IMPLANT
DRSG AQUACEL AG ADV 3.5X10 (GAUZE/BANDAGES/DRESSINGS) ×2 IMPLANT
DURAPREP 26ML APPLICATOR (WOUND CARE) ×2 IMPLANT
ELECT REM PT RETURN 15FT ADLT (MISCELLANEOUS) ×2 IMPLANT
EVACUATOR 1/8 PVC DRAIN (DRAIN) ×1 IMPLANT
GAUZE SPONGE 2X2 8PLY STRL LF (GAUZE/BANDAGES/DRESSINGS) ×1 IMPLANT
GLOVE BIO SURGEON STRL SZ7 (GLOVE) ×2 IMPLANT
GLOVE BIO SURGEON STRL SZ8 (GLOVE) ×2 IMPLANT
GLOVE BIOGEL PI IND STRL 7.0 (GLOVE) ×1 IMPLANT
GLOVE BIOGEL PI IND STRL 8 (GLOVE) ×1 IMPLANT
GLOVE BIOGEL PI INDICATOR 7.0 (GLOVE) ×1
GLOVE BIOGEL PI INDICATOR 8 (GLOVE) ×1
GOWN STRL REUS W/TWL LRG LVL3 (GOWN DISPOSABLE) ×4 IMPLANT
HANDPIECE INTERPULSE COAX TIP (DISPOSABLE) ×2
HOLDER FOLEY CATH W/STRAP (MISCELLANEOUS) ×1 IMPLANT
IMMOBILIZER KNEE 20 (SOFTGOODS) ×2
IMMOBILIZER KNEE 20 THIGH 36 (SOFTGOODS) ×1 IMPLANT
KIT TURNOVER KIT A (KITS) ×1 IMPLANT
MANIFOLD NEPTUNE II (INSTRUMENTS) ×2 IMPLANT
NS IRRIG 1000ML POUR BTL (IV SOLUTION) ×2 IMPLANT
PACK TOTAL KNEE CUSTOM (KITS) ×2 IMPLANT
PADDING CAST COTTON 6X4 STRL (CAST SUPPLIES) ×4 IMPLANT
PENCIL SMOKE EVACUATOR (MISCELLANEOUS) IMPLANT
PIN DRILL FIX HALF THREAD (BIT) ×1 IMPLANT
PIN STEINMAN FIXATION KNEE (PIN) ×1 IMPLANT
PROTECTOR NERVE ULNAR (MISCELLANEOUS) ×2 IMPLANT
SET HNDPC FAN SPRY TIP SCT (DISPOSABLE) ×1 IMPLANT
SPONGE GAUZE 2X2 STER 10/PKG (GAUZE/BANDAGES/DRESSINGS) ×1
STRIP CLOSURE SKIN 1/2X4 (GAUZE/BANDAGES/DRESSINGS) ×4 IMPLANT
SUT MNCRL AB 4-0 PS2 18 (SUTURE) ×2 IMPLANT
SUT STRATAFIX 0 PDS 27 VIOLET (SUTURE) ×2
SUT VIC AB 2-0 CT1 27 (SUTURE) ×6
SUT VIC AB 2-0 CT1 TAPERPNT 27 (SUTURE) ×3 IMPLANT
SUTURE STRATFX 0 PDS 27 VIOLET (SUTURE) ×1 IMPLANT
TIBIAL BASE ROT PLAT SZ 7 KNEE (Knees) ×2 IMPLANT
TRAY FOLEY MTR SLVR 16FR STAT (SET/KITS/TRAYS/PACK) ×2 IMPLANT
WATER STERILE IRR 1000ML POUR (IV SOLUTION) ×4 IMPLANT
WRAP KNEE MAXI GEL POST OP (GAUZE/BANDAGES/DRESSINGS) ×2 IMPLANT
YANKAUER SUCT BULB TIP 10FT TU (MISCELLANEOUS) ×2 IMPLANT

## 2019-08-25 NOTE — Progress Notes (Signed)
PT Cancellation Note  Patient Details Name: Jennifer [name] MRN: [id number] DOB: 02-Mar-1951   Cancelled Treatment:    Reason Eval/Treat Not Completed: Pain limiting ability to participate;Patient not medically ready(Patient reporting 8/10 pain and spinal block not fully worn off, pt unable to perform SLR with non-operative LE and unable to activate glueals. Will follow up at later date/time as schedule allows.)  Verner Mould, DPT Physical Therapist with [facility] [phone]  08/25/2019 6:54 PM

## 2019-08-25 NOTE — Transfer of Care (Signed)
Immediate Anesthesia Transfer of Care Note  Patient: Jennifer [name]  Procedure(s) Performed: RIGHT TOTAL KNEE ARTHROPLASTY (Right Knee)  Patient Location: PACU  Anesthesia Type:Spinal  Level of Consciousness: awake, alert  and oriented  Airway & Oxygen Therapy: Patient Spontanous Breathing and Patient connected to face mask oxygen  Post-op Assessment: Report given to RN and Post -op Vital signs reviewed and stable  Post vital signs: Reviewed and stable  Last Vitals:  Vitals Value Taken Time  BP 119/75 08/25/19 1448  Temp    Pulse 61 08/25/19 1450  Resp 13 08/25/19 1450  SpO2 100 % 08/25/19 1450  Vitals shown include unvalidated device data.  Last Pain:  Vitals:   08/25/19 1204  TempSrc:   PainSc: 0-No pain      Patients Stated Pain Goal: 6 ([id number])  Complications: No apparent anesthesia complications

## 2019-08-25 NOTE — Interval H&P Note (Signed)
History and Physical Interval Note:  08/25/2019 10:57 AM  Jennifer [name]  has presented today for surgery, with the diagnosis of right knee osteoarthritis.  The various methods of treatment have been discussed with the patient and family. After consideration of risks, benefits and other options for treatment, the patient has consented to  Procedure(s) with comments: TOTAL KNEE ARTHROPLASTY (Right) - 43min as a surgical intervention.  The patient's history has been reviewed, patient examined, no change in status, stable for surgery.  I have reviewed the patient's chart and labs.  Questions were answered to the patient's satisfaction.     Pilar Plate Alfonzia Woolum

## 2019-08-25 NOTE — Anesthesia Procedure Notes (Signed)
Anesthesia Regional Block: Adductor canal block   Pre-Anesthetic Checklist: ,, timeout performed, Correct Patient, Correct Site, Correct Laterality, Correct Procedure, Correct Position, site marked, Risks and benefits discussed,  Surgical consent,  Pre-op evaluation,  At surgeon's request and post-op pain management  Laterality: Right  Prep: chloraprep       Needles:  Injection technique: Single-shot  Needle Type: Echogenic Needle     Needle Length: 9cm      Additional Needles:   Procedures:,,,, ultrasound used (permanent image in chart),,,,  Narrative:  Start time: 08/25/2019 11:56 AM End time: 08/25/2019 12:03 PM Injection made incrementally with aspirations every 5 mL.  Performed by: Personally  Anesthesiologist: Myrtie Soman, MD  Additional Notes: Patient tolerated the procedure well without complications

## 2019-08-25 NOTE — Anesthesia Preprocedure Evaluation (Signed)
Anesthesia Evaluation  Patient identified by MRN, date of birth, ID band Patient awake    Reviewed: Allergy & Precautions, NPO status , Patient's Chart, lab work & pertinent test results  Airway Mallampati: II  TM Distance: >3 FB Neck ROM: Full    Dental no notable dental hx.    Pulmonary sleep apnea ,    Pulmonary exam normal breath sounds clear to auscultation       Cardiovascular hypertension, + DOE  Normal cardiovascular exam Rhythm:Regular Rate:Normal     Neuro/Psych negative neurological ROS  negative psych ROS   GI/Hepatic Neg liver ROS, GERD  ,  Endo/Other  diabetesMorbid obesity  Renal/GU negative Renal ROS  negative genitourinary   Musculoskeletal  (+) Fibromyalgia -  Abdominal   Peds negative pediatric ROS (+)  Hematology negative hematology ROS (+)   Anesthesia Other Findings   Reproductive/Obstetrics negative OB ROS                             Anesthesia Physical Anesthesia Plan  ASA: III  Anesthesia Plan: Spinal   Post-op Pain Management:  Regional for Post-op pain   Induction: Intravenous  PONV Risk Score and Plan: 2 and Ondansetron, Dexamethasone and Treatment may vary due to age or medical condition  Airway Management Planned: Simple Face Mask  Additional Equipment:   Intra-op Plan:   Post-operative Plan:   Informed Consent: I have reviewed the patients History and Physical, chart, labs and discussed the procedure including the risks, benefits and alternatives for the proposed anesthesia with the patient or authorized representative who has indicated his/her understanding and acceptance.     Dental advisory given  Plan Discussed with: CRNA and Surgeon  Anesthesia Plan Comments:         Anesthesia Quick Evaluation

## 2019-08-25 NOTE — Op Note (Signed)
OPERATIVE REPORT-TOTAL KNEE ARTHROPLASTY   Pre-operative diagnosis- Osteoarthritis  Right knee(s)  Post-operative diagnosis- Osteoarthritis Right knee(s)  Procedure-  Right  Total Knee Arthroplasty  Surgeon- Jennifer Plover. Halcyon Heck, MD  Assistant- Grover Canavan, PA-C   Anesthesia-  Adductor canal block and spinal  EBL- 25 ml   Drains Hemovac  Tourniquet time-  Total Tourniquet Time Documented: Thigh (Right) - 42 minutes Total: Thigh (Right) - 42 minutes     Complications- None  Condition-PACU - hemodynamically stable.   Brief Clinical Note  Jennifer [name] is a 69 y.o. year old female with end stage OA of her right knee with progressively worsening pain and dysfunction. She has constant pain, with activity and at rest and significant functional deficits with difficulties even with ADLs. She has had extensive non-op management including analgesics, injections of cortisone and viscosupplements, and home exercise program, but remains in significant pain with significant dysfunction.Radiographs show bone on bone arthritis lateral and patellofemoral. She presents now for right Total Knee Arthroplasty.    Procedure in detail---   The patient is brought into the operating room and positioned supine on the operating table. After successful administration of  Adductor canal block and spinal,   a tourniquet is placed high on the  Right thigh(s) and the lower extremity is prepped and draped in the usual sterile fashion. Time out is performed by the operating team and then the  Right lower extremity is wrapped in Esmarch, knee flexed and the tourniquet inflated to 300 mmHg.       A midline incision is made with a ten blade through the subcutaneous tissue to the level of the extensor mechanism. A fresh blade is used to make a medial parapatellar arthrotomy. Soft tissue over the proximal medial tibia is subperiosteally elevated to the joint line with a knife and into the semimembranosus bursa with a  Cobb elevator. Soft tissue over the proximal lateral tibia is elevated with attention being paid to avoiding the patellar tendon on the tibial tubercle. The patella is everted, knee flexed 90 degrees and the ACL and PCL are removed. Findings are bone on bone arthritis all 3 compartments with massive global osteophytes and significant synovitis        The drill is used to create a starting hole in the distal femur and the canal is thoroughly irrigated with sterile saline to remove the fatty contents. The 5 degree Right  valgus alignment guide is placed into the femoral canal and the distal femoral cutting block is pinned to remove 9 mm off the distal femur. Resection is made with an oscillating saw.      The tibia is subluxed forward and the menisci are removed. The extramedullary alignment guide is placed referencing proximally at the medial aspect of the tibial tubercle and distally along the second metatarsal axis and tibial crest. The block is pinned to remove 13mm off the more deficient lateral  side. Resection is made with an oscillating saw. Size 7is the most appropriate size for the tibia and the proximal tibia is prepared with the modular drill and keel punch for that size.      The femoral sizing guide is placed and size 7 is most appropriate. Rotation is marked off the epicondylar axis and confirmed by creating a rectangular flexion gap at 90 degrees. The size 7 cutting block is pinned in this rotation and the anterior, posterior and chamfer cuts are made with the oscillating saw. The intercondylar block is then placed and that  cut is made.      Trial size 7 tibial component, trial size 7 posterior stabilized femur and a 8  mm posterior stabilized rotating platform insert trial is placed. Full extension is achieved with excellent varus/valgus and anterior/posterior balance throughout full range of motion. The patella is everted and thickness measured to be 24  mm. Free hand resection is taken to 13 mm,  a 41 template is placed, lug holes are drilled, trial patella is placed, and it tracks normally. Osteophytes are removed off the posterior femur with the trial in place. All trials are removed and the cut bone surfaces prepared with pulsatile lavage. Cement is mixed and once ready for implantation, the size 7 tibial implant, size  7 posterior stabilized femoral component, and the size 41 patella are cemented in place and the patella is held with the clamp. The trial insert is placed and the knee held in full extension. The Exparel (20 ml mixed with 60 ml saline) is injected into the extensor mechanism, posterior capsule, medial and lateral gutters and subcutaneous tissues.  All extruded cement is removed and once the cement is hard the permanent 8 mm posterior stabilized rotating platform insert is placed into the tibial tray.      The wound is copiously irrigated with saline solution and the extensor mechanism closed over a hemovac drain with #1 V-loc suture. The tourniquet is released for a total tourniquet time of 42  minutes. Flexion against gravity is 140 degrees and the patella tracks normally. Subcutaneous tissue is closed with 2.0 vicryl and subcuticular with running 4.0 Monocryl. The incision is cleaned and dried and steri-strips and a bulky sterile dressing are applied. The limb is placed into a knee immobilizer and the patient is awakened and transported to recovery in stable condition.      Please note that a surgical assistant was a medical necessity for this procedure in order to perform it in a safe and expeditious manner. Surgical assistant was necessary to retract the ligaments and vital neurovascular structures to prevent injury to them and also necessary for proper positioning of the limb to allow for anatomic placement of the prosthesis.   Jennifer Plover Raziya Aveni, MD    08/25/2019, 2:22 PM

## 2019-08-25 NOTE — Care Plan (Signed)
Ortho Bundle Case Management Note  Patient Details  Name: Jennifer [name] MRN: [id number] Date of Birth: 1950-11-04  R TKA on 08-25-19 DCP:  Home with spouse.  1 story home with 2 ste.   DME:  RW and 3-in-1 ordered through [place] PT:  [facility].  PT eval scheduled on 08-28-19.                   DME Arranged:  Walker rolling, 3-N-1 DME Agency:  [employer]  HH Arranged:  NA HH Agency:  NA  Additional Comments: Please contact me with any questions of if this plan should need to change.  Marianne Sofia, RN,CCM [facility]  [phone] 08/25/2019, 3:00 PM

## 2019-08-25 NOTE — Anesthesia Postprocedure Evaluation (Signed)
Anesthesia Post Note  Patient: Jennifer [name]  Procedure(s) Performed: RIGHT TOTAL KNEE ARTHROPLASTY (Right Knee)     Patient location during evaluation: PACU Anesthesia Type: Spinal Level of consciousness: oriented and awake and alert Pain management: pain level controlled Vital Signs Assessment: post-procedure vital signs reviewed and stable Respiratory status: spontaneous breathing, respiratory function stable and patient connected to nasal cannula oxygen Cardiovascular status: blood pressure returned to baseline and stable Postop Assessment: no headache, no backache and no apparent nausea or vomiting Anesthetic complications: no    Last Vitals:  Vitals:   08/25/19 1500 08/25/19 1515  BP: 138/78 (!) 147/82  Pulse: 64 65  Resp: 18 17  Temp: (!) 35.4 C (!) 35.8 C  SpO2: 99% 98%    Last Pain:  Vitals:   08/25/19 1515  TempSrc:   PainSc: 0-No pain    LLE Motor Response: Purposeful movement (08/25/19 1515)   RLE Motor Response: No movement due to regional block (08/25/19 1515)   L Sensory Level: L5-Outer lower leg, top of foot, great toe (08/25/19 1515) R Sensory Level: L3-Anterior knee, lower leg (08/25/19 1515)  Novalie Leamy S

## 2019-08-25 NOTE — Anesthesia Procedure Notes (Signed)
Spinal  Patient location during procedure: OR Start time: 08/25/2019 1:42 PM End time: 08/25/2019 1:42 PM Staffing Performed: resident/CRNA  Resident/CRNA: Lavina Hamman, CRNA Preanesthetic Checklist Completed: patient identified, IV checked, site marked, risks and benefits discussed, surgical consent, monitors and equipment checked, pre-op evaluation and timeout performed Spinal Block Patient position: sitting Prep: DuraPrep Patient monitoring: heart rate, cardiac monitor, continuous pulse ox and blood pressure Approach: midline Location: L3-4 Injection technique: single-shot Needle Needle type: Sprotte  Needle gauge: 24 G Needle length: 9 cm Needle insertion depth: 8 cm Assessment Sensory level: T4 Additional Notes IV functioning, monitors applied to pt. Expiration date of kit checked and confirmed to be in date. Sterile prep and drape, hand hygiene and sterile gloved used. Pt was positioned and spine was prepped in sterile fashion. Skin was anesthetized with lidocaine. Free flow of clear CSF obtained prior to injecting local anesthetic into CSF x 1 attempt. Spinal needle aspirated freely following injection. Needle was carefully withdrawn, and pt tolerated procedure well. Loss of motor and sensory on exam post injection.

## 2019-08-25 NOTE — Progress Notes (Signed)
Assisted Dr. Rose with right, ultrasound guided, adductor canal block. Side rails up, monitors on throughout procedure. See vital signs in flow sheet. Tolerated Procedure well.  

## 2019-08-25 NOTE — Anesthesia Procedure Notes (Signed)
Anesthesia Procedure Image    

## 2019-08-25 NOTE — Discharge Instructions (Addendum)
Gaynelle Arabian, MD Total Joint Specialist [facility] [place] [address], [address] [place], [place] [address] [phone]  TOTAL KNEE REPLACEMENT POSTOPERATIVE DIRECTIONS    Knee Rehabilitation, Guidelines Following Surgery  Results after knee surgery are often greatly improved when you follow the exercise, range of motion and muscle strengthening exercises prescribed by your doctor. Safety measures are also important to protect the knee from further injury. If any of these exercises cause you to have increased pain or swelling in your knee joint, decrease the amount until you are comfortable again and slowly increase them. If you have problems or questions, call your caregiver or physical therapist for advice.   BLOOD CLOT PREVENTION . Take a 10 mg Xarelto once a day for three weeks following surgery. Then resume 5 mg Xarelto once a day. . You may resume your vitamins/supplements once you return home.  HOME CARE INSTRUCTIONS  . Remove items at home which could result in a fall. This includes throw rugs or furniture in walking pathways.  . ICE to the affected knee as much as tolerated. Icing helps control swelling. If the swelling is well controlled you will be more comfortable and rehab easier. Continue to use ice on the knee for pain and swelling from surgery. You may notice swelling that will progress down to the foot and ankle. This is normal after surgery. Elevate the leg when you are not up walking on it.    . Continue to use the breathing machine which will help keep your temperature down. It is common for your temperature to cycle up and down following surgery, especially at night when you are not up moving around and exerting yourself. The breathing machine keeps your lungs expanded and your temperature down. . Do not place pillow under the operative knee, focus on keeping the knee straight while resting  DIET You may resume your previous home diet once you are  discharged from the hospital.  DRESSING / Ves[place] / SHOWERING . Keep your bulky bandage on for 2 days. On the third post-operative day you may remove the Ace bandage and gauze. There is a waterproof adhesive bandage on your skin which will stay in place until your first follow-up appointment. Once you remove this you will not need to place another bandage . You may begin showering 3 days following surgery, but do not submerge the incision under water.  ACTIVITY For the first 5 days, the key is rest and control of pain and swelling . Do your home exercises twice a day starting on post-operative day 3. On the days you go to physical therapy, just do the home exercises once that day. . You should rest, ice and elevate the leg for 50 minutes out of every hour. Get up and walk/stretch for 10 minutes per hour. After 5 days you can increase your activity slowly as tolerated. . Walk with your walker as instructed. Use the walker until you are comfortable transitioning to a cane. Walk with the cane in the opposite hand of the operative leg. You may discontinue the cane once you are comfortable and walking steadily. . Avoid periods of inactivity such as sitting longer than an hour when not asleep. This helps prevent blood clots.  . You may discontinue the knee immobilizer once you are able to perform a straight leg raise while lying down. . You may resume a sexual relationship in one month or when given the OK by your doctor.  . You may return  to work once you are cleared by your doctor.  . Do not drive a car for 6 weeks or until released by your surgeon.  . Do not drive while taking narcotics.  TED HOSE STOCKINGS Wear the elastic stockings on both legs for three weeks following surgery during the day. You may remove them at night for sleeping.  WEIGHT BEARING Weight bearing as tolerated with assist device (walker, cane, etc) as directed, use it as long as suggested by your surgeon or therapist,  typically at least 4-6 weeks.  POSTOPERATIVE CONSTIPATION PROTOCOL Constipation - defined medically as fewer than three stools per week and severe constipation as less than one stool per week.  One of the most common issues patients have following surgery is constipation.  Even if you have a regular bowel pattern at home, your normal regimen is likely to be disrupted due to multiple reasons following surgery.  Combination of anesthesia, postoperative narcotics, change in appetite and fluid intake all can affect your bowels.  In order to avoid complications following surgery, here are some recommendations in order to help you during your recovery period.  . Colace (docusate) - Pick up an over-the-counter form of Colace or another stool softener and take twice a day as long as you are requiring postoperative pain medications.  Take with a full glass of water daily.  If you experience loose stools or diarrhea, hold the colace until you stool forms back up. If your symptoms do not get better within 1 week or if they get worse, check with your doctor. . Dulcolax (bisacodyl) - Pick up over-the-counter and take as directed by the product packaging as needed to assist with the movement of your bowels.  Take with a full glass of water.  Use this product as needed if not relieved by Colace only.  . MiraLax (polyethylene glycol) - Pick up over-the-counter to have on hand. MiraLax is a solution that will increase the amount of water in your bowels to assist with bowel movements.  Take as directed and can mix with a glass of water, juice, soda, coffee, or tea. Take if you go more than two days without a movement. Do not use MiraLax more than once per day. Call your doctor if you are still constipated or irregular after using this medication for 7 days in a row.  If you continue to have problems with postoperative constipation, please contact the office for further assistance and recommendations.  If you experience  "the worst abdominal pain ever" or develop nausea or vomiting, please contact the office immediatly for further recommendations for treatment.  ITCHING If you experience itching with your medications, try taking only a single pain pill, or even half a pain pill at a time.  You can also use Benadryl over the counter for itching or also to help with sleep.   MEDICATIONS See your medication summary on the "After Visit Summary" that the nursing staff will review with you prior to discharge.  You may have some home medications which will be placed on hold until you complete the course of blood thinner medication.  It is important for you to complete the blood thinner medication as prescribed by your surgeon.  Continue your approved medications as instructed at time of discharge.  PRECAUTIONS . If you experience chest pain or shortness of breath - call 911 immediately for transfer to the hospital emergency department.  . If you develop a fever greater that 101 F, purulent drainage from wound, increased  redness or drainage from wound, foul odor from the wound/dressing, or calf pain - CONTACT YOUR SURGEON.                                                   FOLLOW-UP APPOINTMENTS Make sure you keep all of your appointments after your operation with your surgeon and caregivers. You should call the office at the above phone number and make an appointment for approximately two weeks after the date of your surgery or on the date instructed by your surgeon outlined in the "After Visit Summary".  RANGE OF MOTION AND STRENGTHENING EXERCISES  Rehabilitation of the knee is important following a knee injury or an operation. After just a few days of immobilization, the muscles of the thigh which control the knee become weakened and shrink (atrophy). Knee exercises are designed to build up the tone and strength of the thigh muscles and to improve knee motion. Often times heat used for twenty to thirty minutes before  working out will loosen up your tissues and help with improving the range of motion but do not use heat for the first two weeks following surgery. These exercises can be done on a training (exercise) mat, on the floor, on a table or on a bed. Use what ever works the best and is most comfortable for you Knee exercises include:  . Leg Lifts - While your knee is still immobilized in a splint or cast, you can do straight leg raises. Lift the leg to 60 degrees, hold for 3 sec, and slowly lower the leg. Repeat 10-20 times 2-3 times daily. Perform this exercise against resistance later as your knee gets better.  Javier Docker and Hamstring Sets - Tighten up the muscle on the front of the thigh (Quad) and hold for 5-10 sec. Repeat this 10-20 times hourly. Hamstring sets are done by pushing the foot backward against an object and holding for 5-10 sec. Repeat as with quad sets.   Leg Slides: Lying on your back, slowly slide your foot toward your buttocks, bending your knee up off the floor (only go as far as is comfortable). Then slowly slide your foot back down until your leg is flat on the floor again.  Angel Wings: Lying on your back spread your legs to the side as far apart as you can without causing discomfort.  A rehabilitation program following serious knee injuries can speed recovery and prevent re-injury in the future due to weakened muscles. Contact your doctor or a physical therapist for more information on knee rehabilitation.   IF YOU ARE TRANSFERRED TO A SKILLED REHAB FACILITY If the patient is transferred to a skilled rehab facility following release from the hospital, a list of the current medications will be sent to the facility for the patient to continue.  When discharged from the skilled rehab facility, please have the facility set up the patient's [place] prior to being released. Also, the skilled facility will be responsible for providing the patient with their medications at time  of release from the facility to include their pain medication, the muscle relaxants, and their blood thinner medication. If the patient is still at the rehab facility at time of the two week follow up appointment, the skilled rehab facility will also need to assist the patient in arranging follow up appointment in  our office and any transportation needs.  MAKE SURE YOU:  . Understand these instructions.  . Get help right away if you are not doing well or get worse.   DENTAL ANTIBIOTICS:  In most cases prophylactic antibiotics for Dental procdeures after total joint surgery are not necessary.  Exceptions are as follows:  1. History of prior total joint infection  2. Severely immunocompromised (Organ Transplant, cancer chemotherapy, Rheumatoid biologic meds such as [place])  3. Poorly controlled diabetes (A1C &gt; 8.0, blood glucose over 200)  If you have one of these conditions, contact your surgeon for an antibiotic prescription, prior to your dental procedure.    Pick up stool softner and laxative for home use following surgery while on pain medications. Do not submerge incision under water. Please use good hand washing techniques while changing dressing each day. May shower starting three days after surgery. Please use a clean towel to pat the incision dry following showers. Continue to use ice for pain and swelling after surgery. Do not use any lotions or creams on the incision until instructed by your surgeon.  Information on my medicine - XARELTO (Rivaroxaban)  This medication education was reviewed with me or my healthcare representative as part of my discharge preparation.  The pharmacist that spoke with me during my hospital stay was:  Minda Ditto, [facility]  Why was Xarelto prescribed for you? Xarelto was prescribed for you to reduce the risk of blood clots forming after orthopedic surgery. The medical term for these abnormal blood clots is venous thromboembolism  (VTE).  What do you need to know about xarelto ? Take your Xarelto ONCE DAILY at the same time every day. You may take it either with or without food.  If you have difficulty swallowing the tablet whole, you may crush it and mix in applesauce just prior to taking your dose.  Take Xarelto exactly as prescribed by your doctor and DO NOT stop taking Xarelto without talking to the doctor who prescribed the medication.  Stopping without other VTE prevention medication to take the place of Xarelto may increase your risk of developing a clot.  After discharge, you should have regular check-up appointments with your healthcare provider that is prescribing your Xarelto.    What do you do if you miss a dose? If you miss a dose, take it as soon as you remember on the same day then continue your regularly scheduled once daily regimen the next day. Do not take two doses of Xarelto on the same day.   Important Safety Information A possible side effect of Xarelto is bleeding. You should call your healthcare provider right away if you experience any of the following: ? Bleeding from an injury or your nose that does not stop. ? Unusual colored urine (red or dark brown) or unusual colored stools (red or black). ? Unusual bruising for unknown reasons. ? A serious fall or if you hit your head (even if there is no bleeding).  Some medicines may interact with Xarelto and might increase your risk of bleeding while on Xarelto. To help avoid this, consult your healthcare provider or pharmacist prior to using any new prescription or non-prescription medications, including herbals, vitamins, non-steroidal anti-inflammatory drugs (NSAIDs) and supplements.  This website has more information on Xarelto: [email or web address]-benson.[email or web address]/.  Discharge Xarelto dose is 10mg  daily x 20 days, then resume 5mg  daily on 5/25 per Ortho instructions

## 2019-08-25 NOTE — Progress Notes (Signed)
Orthopedic Tech Progress Note Patient Details:  Jennifer [name] 1950/07/12 [id number] Patient taken out of [facility] at 1800 Patient ID: [name], female   DOB: April 03, 1951, 69 y.o.   MRN: [id number]   [name] 08/25/2019, 6:07 PM

## 2019-08-26 ENCOUNTER — Encounter: Payer: Self-pay | Admitting: *Deleted

## 2019-08-26 LAB — CBC
HCT: 39.3 % (ref 36.0–46.0)
Hemoglobin: 12.2 g/dL (ref 12.0–15.0)
MCH: 28.4 pg (ref 26.0–34.0)
MCHC: 31 g/dL (ref 30.0–36.0)
MCV: 91.4 fL (ref 80.0–100.0)
Platelets: 247 10*3/uL (ref 150–400)
RBC: 4.3 MIL/uL (ref 3.87–5.11)
RDW: 15.2 % (ref 11.5–15.5)
WBC: 8.1 10*3/uL (ref 4.0–10.5)
nRBC: 0 % (ref 0.0–0.2)

## 2019-08-26 LAB — BASIC METABOLIC PANEL
Anion gap: 11 (ref 5–15)
BUN: 17 mg/dL (ref 8–23)
CO2: 23 mmol/L (ref 22–32)
Calcium: 8.1 mg/dL — ABNORMAL LOW (ref 8.9–10.3)
Chloride: 104 mmol/L (ref 98–111)
Creatinine, Ser: 0.97 mg/dL (ref 0.44–1.00)
GFR calc Af Amer: >60 mL/min (ref >60)
GFR calc non Af Amer: >60 mL/min (ref >60)
Glucose, Bld: 157 mg/dL — ABNORMAL HIGH (ref 70–99)
Potassium: 4.2 mmol/L (ref 3.5–5.1)
Sodium: 138 mmol/L (ref 135–145)

## 2019-08-26 MED ORDER — OXYCODONE HCL 5 MG PO TABS: 5.0000 mg | ORAL_TABLET | Freq: Four times a day (QID) | ORAL | 0 refills | Status: DC | PRN

## 2019-08-26 MED ORDER — GABAPENTIN 300 MG PO CAPS: ORAL_CAPSULE | ORAL | 0 refills | Status: DC

## 2019-08-26 MED ORDER — RIVAROXABAN 10 MG PO TABS: 10.0000 mg | ORAL_TABLET | Freq: Every day | ORAL | 0 refills | Status: DC

## 2019-08-26 MED ORDER — TRAMADOL HCL 50 MG PO TABS: 50.0000 mg | ORAL_TABLET | Freq: Four times a day (QID) | ORAL | 0 refills | Status: DC | PRN

## 2019-08-26 MED ORDER — METHOCARBAMOL 500 MG PO TABS: 500.0000 mg | ORAL_TABLET | Freq: Four times a day (QID) | ORAL | 0 refills | Status: DC | PRN

## 2019-08-26 NOTE — Progress Notes (Signed)
Physical Therapy Treatment Patient Details Name: Jennifer [name] MRN: [id number] DOB: 17-Dec-1950 Today's Date: 08/26/2019    History of Present Illness Patient is 69 y.o. female s/p Rt TKA on 08/25/19 with PMH significant for OSA, HTN, GERD, fibromyalgia, DM, OA, anemia, prior knee arthroscopy, brain surgery for chiari malformation.    PT Comments    Pt assisted to bathroom (had not yet voided, and not successful) and then ambulated very short distance in hallway.  Pt limited by pain and nausea so assisted back to bed.  Pt unable to tolerate exercises at this time however encouraged to bend knee later (if feeling stiff) and perform ankle pumps.  Pt not yet ready for d/c home.   Follow Up Recommendations  Follow surgeon's recommendation for DC plan and follow-up therapies     Equipment Recommendations  None recommended by PT    Recommendations for Other Services       Precautions / Restrictions Precautions Precautions: Fall;Knee Required Braces or Orthoses: Knee Immobilizer - Right Restrictions Weight Bearing Restrictions: No    Mobility  Bed Mobility Overal bed mobility: Needs Assistance Bed Mobility: Sit to Supine     Supine to sit: Min assist;HOB elevated Sit to supine: Min assist   General bed mobility comments: assist for R LE  Transfers Overall transfer level: Needs assistance Equipment used: Rolling walker (2 wheeled) Transfers: Sit to/from Stand Sit to Stand: Min assist         General transfer comment: verbal cues for UE and LE positioning  Ambulation/Gait Ambulation/Gait assistance: Min assist Gait Distance (Feet): 22 Feet Assistive device: Rolling walker (2 wheeled) Gait Pattern/deviations: Step-to pattern;Decreased stance time - right;Antalgic Gait velocity: decreased   General Gait Details: verbal cues for sequence, RW positioning, posture, step length, distance limited by pain and nausea   Stairs             Wheelchair Mobility     Modified Rankin (Stroke Patients Only)       Balance                                            Cognition Arousal/Alertness: Awake/alert Behavior During Therapy: WFL for tasks assessed/performed Overall Cognitive Status: No family/caregiver present to determine baseline cognitive functioning                                 General Comments: more awake and alert this afternoon, had pain meds just prior to session and nausea again with ambulation      Exercises      General Comments        Pertinent Vitals/Pain Pain Assessment: Faces Faces Pain Scale: Hurts whole lot Pain Location: right knee Pain Descriptors / Indicators: Grimacing;Aching Pain Intervention(s): Repositioned;RN gave pain meds during session;Monitored during session    Home Living Family/patient expects to be discharged to:: Private residence Living Arrangements: Spouse/significant other   Type of Home: House Home Access: Level entry   Home Layout: One level Home Equipment: Environmental consultant - 2 wheels      Prior Function Level of Independence: Independent          PT Goals (current goals can now be found in the care plan section) Acute Rehab PT Goals PT Goal Formulation: With patient Time For Goal Achievement: 09/09/19 Potential to Achieve Goals: Good Progress  towards PT goals: Progressing toward goals    Frequency    7X/week      PT Plan Current plan remains appropriate    Co-evaluation              AM-PAC PT "6 Clicks" Mobility   Outcome Measure  Help needed turning from your back to your side while in a flat bed without using bedrails?: A Little Help needed moving from lying on your back to sitting on the side of a flat bed without using bedrails?: A Little Help needed moving to and from a bed to a chair (including a wheelchair)?: A Little Help needed standing up from a chair using your arms (e.g., wheelchair or bedside chair)?: A Little Help  needed to walk in hospital room?: A Little Help needed climbing 3-5 steps with a railing? : A Lot 6 Click Score: 17    End of Session Equipment Utilized During Treatment: Gait belt Activity Tolerance: Patient limited by fatigue;Patient limited by pain Patient left: with call bell/phone within reach;in bed;with bed alarm set Nurse Communication: Mobility status PT Visit Diagnosis: Other abnormalities of gait and mobility (R26.89)     Time: [id number] PT Time Calculation (min) (ACUTE ONLY): 26 min  Charges:  $Gait Training: 8-22 mins $Therapeutic Activity: 8-22 mins                    Arlyce Dice, DPT Acute Rehabilitation Services Office: [place] E 08/26/2019, 4:08 PM

## 2019-08-26 NOTE — TOC Progression Note (Signed)
Transition of Care [facility]) - Progression Note    Patient Details  Name: Jennifer [name] MRN: [id number] Date of Birth: 10-12-1950  Transition of Care [facility] CM/SW [place], [place] Phone Number: 08/26/2019, 10:31 AM  Clinical Narrative:    Patient insurance has paid for a RW in the past 5 years and will not pay for another. Patient reports a friend borrowed her RW. She plans to retrieve it. Patient understands she will need the RW prior to discharge.  3 in 1 delivered by [facility]      Expected Discharge Plan and Services           Expected Discharge Date: 08/26/19               DME Arranged: Gilford Rile rolling, 3-N-1 DME Agency: [employer]       HH Arranged: NA HH Agency: NA         Social Determinants of Health (SDOH) Interventions    Readmission Risk Interventions No flowsheet data found.

## 2019-08-26 NOTE — Plan of Care (Signed)
  Problem: Clinical Measurements: Goal: Will remain free from infection Outcome: Progressing   Problem: Clinical Measurements: Goal: Respiratory complications will improve Outcome: Progressing   Problem: Clinical Measurements: Goal: Cardiovascular complication will be avoided Outcome: Progressing   Problem: Activity: Goal: Risk for activity intolerance will decrease Outcome: Progressing   Problem: Nutrition: Goal: Adequate nutrition will be maintained Outcome: Progressing   Problem: Coping: Goal: Level of anxiety will decrease Outcome: Progressing   Problem: Pain Managment: Goal: General experience of comfort will improve Outcome: Progressing   

## 2019-08-26 NOTE — Progress Notes (Signed)
Subjective: 1 Day Post-Op Procedure(s) (LRB): RIGHT TOTAL KNEE ARTHROPLASTY (Right) Patient reports pain as mild.   Patient seen in rounds by Dr. Wynelle Link. Patient is well, and has had no acute complaints or problems other than pain in the right knee. No issues overnight. Denies chest pain, SOB, or calf pain. Foley catheter to be removed this AM. We will begin therapy today.   Objective: Vital signs in last 24 hours: Temp:  [95.3 F (35.2 C)-98.9 F (37.2 C)] 98.9 F (37.2 C) (05/04 0610) Pulse Rate:  [54-92] 80 (05/04 0610) Resp:  [10-23] 15 (05/04 0610) BP: (119-164)/(66-125) 132/110 (05/04 0610) SpO2:  [93 %-100 %] 98 % (05/04 0610) Weight:  [116.1 kg] 116.1 kg (05/03 1036)  Intake/Output from previous day:  Intake/Output Summary (Last 24 hours) at 08/26/2019 0732 Last data filed at 08/26/2019 0610 Gross per 24 hour  Intake 2494.95 ml  Output 2350 ml  Net 144.95 ml     Intake/Output this shift: No intake/output data recorded.  Labs: Recent Labs    08/26/19 0307  HGB 12.2   Recent Labs    08/26/19 0307  WBC 8.1  RBC 4.30  HCT 39.3  PLT 247   Recent Labs    08/26/19 0307  NA 138  K 4.2  CL 104  CO2 23  BUN 17  CREATININE 0.97  GLUCOSE 157*  CALCIUM 8.1*   No results for input(s): LABPT, INR in the last 72 hours.  Exam: General - Patient is Alert and Oriented Extremity - Neurologically intact Neurovascular intact Sensation intact distally Dorsiflexion/Plantar flexion intact Dressing - dressing C/D/I Motor Function - intact, moving foot and toes well on exam.   Past Medical History:  Diagnosis Date  . Anemia   . Arthritis   . Chronic chest pain   . Chronic fatigue 02/03/2017  . Colon polyps    inflamed partially ulcerated hyperplastic polyp  . Diabetes ([place]) 09/24/2013  . Diverticulosis   . Dysphagia esophageal phase - chronic after 3 fundoplications [id number]  . Esophageal stenosis    Stricture after fundoplication REDO  . Fibromyalgia     . GERD (gastroesophageal reflux disease)   . Heart murmur    no problems   . Hemorrhoids, internal, with bleeding 06/27/2017  . History of hiatal hernia   . Hypertension   . IBS (irritable bowel syndrome)   . Mitral valve prolapse   . OSA (obstructive sleep apnea)    on CPAP-Mild - Sleep study 2004  . Pulmonary embolus ([place])    2010 and 12-2013  . Sleep apnea    wears CPAP  . Thyroid nodule      dr. watching   . Tinnitus    Subjective  . [place] 2014   R flank    Assessment/Plan: 1 Day Post-Op Procedure(s) (LRB): RIGHT TOTAL KNEE ARTHROPLASTY (Right) Principal Problem:   OA (osteoarthritis) of knee Active Problems:   Primary osteoarthritis of right knee  Estimated body mass index is 38.36 kg/m as calculated from the following:   Height as of this encounter: 5' 8.5" (1.74 m).   Weight as of this encounter: 116.1 kg. Advance diet Up with therapy D/C IV fluids  Anticipated LOS equal to or greater than 2 midnights due to - Age 18 and older with one or more of the following:  - Obesity  - Expected need for hospital services (PT, OT, Nursing) required for safe  discharge  - Anticipated need for postoperative skilled nursing care or inpatient rehab  -  Active co-morbidities: Diabetes and DVT/VTE OR   - Unanticipated findings during/Post Surgery: None  - Patient is a high risk of re-admission due to: None    DVT Prophylaxis - Xarelto Weight bearing as tolerated. D/C O2 and pulse ox and try on room air. Hemovac pulled without difficulty, will begintherapy today.  Plan is to go Home after hospital stay.  Possible discharge later today if she progresses with therapy and is meeting her goals. Scheduled for OPPT at [facility]. Follow-up in the office in 2 weeks.   Theresa Duty, PA-C Orthopedic Surgery [phone] 08/26/2019, 7:32 AM

## 2019-08-26 NOTE — Evaluation (Signed)
Physical Therapy Evaluation Patient Details Name: Jennifer [name] MRN: [id number] DOB: December 22, 1950 Today's Date: 08/26/2019   History of Present Illness  Patient is 69 y.o. female s/p Rt TKA on 08/25/19 with PMH significant for OSA, HTN, GERD, fibromyalgia, DM, OA, anemia, prior knee arthroscopy, brain surgery for chiari malformation.  Clinical Impression  Pt is s/p TKA resulting in the deficits listed below (see PT Problem List). Pt will benefit from skilled PT to increase their independence and safety with mobility to allow discharge to the venue listed below.  Pt presents with lethargy and grogginess.  Pt agreeable to mobilize as tolerated. Pt with nausea upon returning to room during ambulation as well.  RN notified.     Follow Up Recommendations Follow surgeon's recommendation for DC plan and follow-up therapies    Equipment Recommendations  None recommended by PT    Recommendations for Other Services       Precautions / Restrictions Precautions Precautions: Fall;Knee Required Braces or Orthoses: Knee Immobilizer - Right Restrictions Weight Bearing Restrictions: No      Mobility  Bed Mobility Overal bed mobility: Needs Assistance Bed Mobility: Supine to Sit     Supine to sit: Min assist;HOB elevated     General bed mobility comments: assist for R LE over EOB  Transfers Overall transfer level: Needs assistance Equipment used: Rolling walker (2 wheeled) Transfers: Sit to/from Stand Sit to Stand: Min assist         General transfer comment: verbal cues for UE and LE positioning  Ambulation/Gait Ambulation/Gait assistance: Min assist Gait Distance (Feet): 40 Feet Assistive device: Rolling walker (2 wheeled) Gait Pattern/deviations: Step-to pattern;Decreased stance time - right;Antalgic Gait velocity: decreased   General Gait Details: cues to keep eyes open, verbal cues for sequence, RW position, posture, pt with nausea and belching upon returning to room, provided  emesis bag and cold washclothes upon return to recliner  Stairs            Wheelchair Mobility    Modified Rankin (Stroke Patients Only)       Balance                                             Pertinent Vitals/Pain Pain Assessment: Faces Faces Pain Scale: Hurts even more Pain Location: right knee Pain Descriptors / Indicators: Grimacing;Aching Pain Intervention(s): Monitored during session;Repositioned    Home Living Family/patient expects to be discharged to:: Private residence Living Arrangements: Spouse/significant other   Type of Home: House Home Access: Level entry     Home Layout: One level Home Equipment: Environmental consultant - 2 wheels      Prior Function Level of Independence: Independent               Hand Dominance        Extremity/Trunk Assessment        Lower Extremity Assessment Lower Extremity Assessment: RLE deficits/detail RLE Deficits / Details: unable to perform SLR, limited by pain       Communication   Communication: No difficulties  Cognition Arousal/Alertness: Lethargic;Suspect due to medications Behavior During Therapy: Flat affect Overall Cognitive Status: No family/caregiver present to determine baseline cognitive functioning                                 General Comments: pt appears groggy  and then became nauseated end of session      General Comments      Exercises     Assessment/Plan    PT Assessment Patient needs continued PT services  PT Problem List Decreased mobility;Decreased strength;Decreased range of motion;Decreased knowledge of precautions;Pain;Decreased knowledge of use of DME;Decreased balance;Decreased activity tolerance       PT Treatment Interventions Functional mobility training;Stair training;Balance training;Gait training;Therapeutic exercise;Patient/family education;DME instruction;Therapeutic activities    PT Goals (Current goals can be found in the Care Plan  section)  Acute Rehab PT Goals PT Goal Formulation: With patient Time For Goal Achievement: 09/09/19 Potential to Achieve Goals: Good    Frequency 7X/week   Barriers to discharge        Co-evaluation               AM-PAC PT "6 Clicks" Mobility  Outcome Measure Help needed turning from your back to your side while in a flat bed without using bedrails?: A Little Help needed moving from lying on your back to sitting on the side of a flat bed without using bedrails?: A Little Help needed moving to and from a bed to a chair (including a wheelchair)?: A Little Help needed standing up from a chair using your arms (e.g., wheelchair or bedside chair)?: A Little Help needed to walk in hospital room?: A Little Help needed climbing 3-5 steps with a railing? : A Lot 6 Click Score: 17    End of Session Equipment Utilized During Treatment: Gait belt Activity Tolerance: Patient limited by fatigue Patient left: with call bell/phone within reach;with chair alarm set;in chair Nurse Communication: Mobility status PT Visit Diagnosis: Other abnormalities of gait and mobility (R26.89)    Time: [id number] PT Time Calculation (min) (ACUTE ONLY): 19 min   Charges:   PT Evaluation $PT Eval Low Complexity: 1 Low          Kati PT, DPT Acute Rehabilitation Services Office: [place] E 08/26/2019, 12:36 PM

## 2019-08-26 NOTE — Plan of Care (Signed)

## 2019-08-27 DIAGNOSIS — F419 Anxiety disorder, unspecified: Secondary | ICD-10-CM | POA: Diagnosis present

## 2019-08-27 DIAGNOSIS — Z6838 Body mass index (BMI) 38.0-38.9, adult: Secondary | ICD-10-CM | POA: Diagnosis not present

## 2019-08-27 DIAGNOSIS — K219 Gastro-esophageal reflux disease without esophagitis: Secondary | ICD-10-CM | POA: Diagnosis present

## 2019-08-27 DIAGNOSIS — M797 Fibromyalgia: Secondary | ICD-10-CM | POA: Diagnosis present

## 2019-08-27 DIAGNOSIS — Z885 Allergy status to narcotic agent status: Secondary | ICD-10-CM | POA: Diagnosis not present

## 2019-08-27 DIAGNOSIS — Q0700 Arnold-Chiari syndrome without spina bifida or hydrocephalus: Secondary | ICD-10-CM | POA: Diagnosis not present

## 2019-08-27 DIAGNOSIS — Z833 Family history of diabetes mellitus: Secondary | ICD-10-CM | POA: Diagnosis not present

## 2019-08-27 DIAGNOSIS — Z9842 Cataract extraction status, left eye: Secondary | ICD-10-CM | POA: Diagnosis not present

## 2019-08-27 DIAGNOSIS — M659 Synovitis and tenosynovitis, unspecified: Secondary | ICD-10-CM | POA: Diagnosis present

## 2019-08-27 DIAGNOSIS — E119 Type 2 diabetes mellitus without complications: Secondary | ICD-10-CM | POA: Diagnosis present

## 2019-08-27 DIAGNOSIS — Z96651 Presence of right artificial knee joint: Secondary | ICD-10-CM

## 2019-08-27 DIAGNOSIS — G4733 Obstructive sleep apnea (adult) (pediatric): Secondary | ICD-10-CM | POA: Diagnosis present

## 2019-08-27 DIAGNOSIS — Z9841 Cataract extraction status, right eye: Secondary | ICD-10-CM | POA: Diagnosis not present

## 2019-08-27 DIAGNOSIS — Z888 Allergy status to other drugs, medicaments and biological substances status: Secondary | ICD-10-CM | POA: Diagnosis not present

## 2019-08-27 DIAGNOSIS — Z882 Allergy status to sulfonamides status: Secondary | ICD-10-CM | POA: Diagnosis not present

## 2019-08-27 DIAGNOSIS — Z8261 Family history of arthritis: Secondary | ICD-10-CM | POA: Diagnosis not present

## 2019-08-27 DIAGNOSIS — Z86711 Personal history of pulmonary embolism: Secondary | ICD-10-CM | POA: Diagnosis not present

## 2019-08-27 DIAGNOSIS — Z79899 Other long term (current) drug therapy: Secondary | ICD-10-CM | POA: Diagnosis not present

## 2019-08-27 DIAGNOSIS — Z8249 Family history of ischemic heart disease and other diseases of the circulatory system: Secondary | ICD-10-CM | POA: Diagnosis not present

## 2019-08-27 DIAGNOSIS — I10 Essential (primary) hypertension: Secondary | ICD-10-CM | POA: Diagnosis present

## 2019-08-27 DIAGNOSIS — M1711 Unilateral primary osteoarthritis, right knee: Secondary | ICD-10-CM | POA: Diagnosis present

## 2019-08-27 LAB — CBC
HCT: 34.5 % — ABNORMAL LOW (ref 36.0–46.0)
Hemoglobin: 10.8 g/dL — ABNORMAL LOW (ref 12.0–15.0)
MCH: 28.6 pg (ref 26.0–34.0)
MCHC: 31.3 g/dL (ref 30.0–36.0)
MCV: 91.3 fL (ref 80.0–100.0)
Platelets: 218 10*3/uL (ref 150–400)
RBC: 3.78 MIL/uL — ABNORMAL LOW (ref 3.87–5.11)
RDW: 15.5 % (ref 11.5–15.5)
WBC: 13.7 10*3/uL — ABNORMAL HIGH (ref 4.0–10.5)
nRBC: 0 % (ref 0.0–0.2)

## 2019-08-27 LAB — BASIC METABOLIC PANEL
Anion gap: 6 (ref 5–15)
BUN: 21 mg/dL (ref 8–23)
CO2: 30 mmol/L (ref 22–32)
Calcium: 8.2 mg/dL — ABNORMAL LOW (ref 8.9–10.3)
Chloride: 103 mmol/L (ref 98–111)
Creatinine, Ser: 0.86 mg/dL (ref 0.44–1.00)
GFR calc Af Amer: >60 mL/min (ref >60)
GFR calc non Af Amer: >60 mL/min (ref >60)
Glucose, Bld: 141 mg/dL — ABNORMAL HIGH (ref 70–99)
Potassium: 4 mmol/L (ref 3.5–5.1)
Sodium: 139 mmol/L (ref 135–145)

## 2019-08-27 MED ORDER — ONDANSETRON HCL 4 MG PO TABS: 4.0000 mg | ORAL_TABLET | Freq: Three times a day (TID) | ORAL | 1 refills | Status: DC | PRN

## 2019-08-27 MED ORDER — HYDROMORPHONE HCL 2 MG PO TABS: 2.0000 mg | ORAL_TABLET | ORAL | Status: DC | PRN

## 2019-08-27 MED ORDER — HYDROMORPHONE HCL 2 MG PO TABS
2.0000 mg | ORAL_TABLET | ORAL | Status: DC | PRN
  Administered 2019-08-27: 2 mg via ORAL
  Filled 2019-08-27 (×2): qty 1

## 2019-08-27 MED ORDER — HYDROMORPHONE HCL 2 MG PO TABS: 4.0000 mg | ORAL_TABLET | ORAL | Status: DC | PRN

## 2019-08-27 NOTE — Progress Notes (Signed)
Orthopedic Tech Progress Note Patient Details:  Jennifer [name] August 20, 1950 [id number]  CPM Right Knee CPM Right Knee: On Right Knee Flexion (Degrees): 40 Right Knee Extension (Degrees): 10  Post Interventions Patient Tolerated: Unable to use device properly Instructions Provided: Adjustment of device, Care of device  [name] 08/27/2019, 4:41 PM

## 2019-08-27 NOTE — Progress Notes (Signed)
Physical Therapy Treatment Patient Details Name: Jennifer [name] MRN: [id number] DOB: 05/18/1950 Today's Date: 08/27/2019    History of Present Illness Patient is 69 y.o. female s/p Rt TKA on 08/25/19 with PMH significant for OSA, HTN, GERD, fibromyalgia, DM, OA, anemia, prior knee arthroscopy, brain surgery for chiari malformation.    PT Comments    Pt performed LE exercises in recliner and then assisted with ambulating. Pt continues to be limited with mobility due to reported nausea and 9/10 pain this afternoon, also reporting dizziness.  Pt has spouse at home however pt does not appear ready for d/c today.  Due to limited mobility and pain, pt may benefit from HHPT prior to OPPT upon d/c.    Follow Up Recommendations  Follow surgeon's recommendation for DC plan and follow-up therapies;Home health PT     Equipment Recommendations  None recommended by PT    Recommendations for Other Services       Precautions / Restrictions Precautions Precautions: Fall;Knee Required Braces or Orthoses: Knee Immobilizer - Right Restrictions Weight Bearing Restrictions: No    Mobility  Bed Mobility Overal bed mobility: Needs Assistance Bed Mobility: Sit to Supine     Supine to sit: Min assist;HOB elevated Sit to supine: Min assist   General bed mobility comments: assist for R LE  Transfers Overall transfer level: Needs assistance Equipment used: Rolling walker (2 wheeled) Transfers: Sit to/from Stand Sit to Stand: Mod assist         General transfer comment: verbal cues for UE and LE positioning, assist to rise, steady, and control descent; more assist from recliner surface and assist to steady  Ambulation/Gait Ambulation/Gait assistance: Min assist Gait Distance (Feet): 20 Feet Assistive device: Rolling walker (2 wheeled) Gait Pattern/deviations: Step-to pattern;Decreased stance time - right;Antalgic Gait velocity: decreased   General Gait Details: verbal cues for sequence, RW  positioning, posture, step length, distance limited by pain and nausea and dizziness   Stairs             Wheelchair Mobility    Modified Rankin (Stroke Patients Only)       Balance                                            Cognition Arousal/Alertness: Awake/alert Behavior During Therapy: WFL for tasks assessed/performed Overall Cognitive Status: Within Functional Limits for tasks assessed                                        Exercises Total Joint Exercises Ankle Circles/Pumps: AROM;Both;10 reps Quad Sets: AROM;10 reps;Right Short Arc QuadSinclair Ship;Right;10 reps Heel Slides: AAROM;Right;10 reps Hip ABduction/ADduction: AAROM;Right;10 reps Straight Leg Raises: AAROM;Right;10 reps    General Comments        Pertinent Vitals/Pain Pain Assessment: Faces Pain Score: 9  Pain Location: right knee Pain Descriptors / Indicators: Grimacing;Aching;Sore Pain Intervention(s): Repositioned;Monitored during session;Patient requesting pain meds-RN notified    Home Living                      Prior Function            PT Goals (current goals can now be found in the care plan section) Progress towards PT goals: Progressing toward goals    Frequency  7X/week      PT Plan Current plan remains appropriate    Co-evaluation              AM-PAC PT "6 Clicks" Mobility   Outcome Measure  Help needed turning from your back to your side while in a flat bed without using bedrails?: A Little Help needed moving from lying on your back to sitting on the side of a flat bed without using bedrails?: A Little Help needed moving to and from a bed to a chair (including a wheelchair)?: A Little Help needed standing up from a chair using your arms (e.g., wheelchair or bedside chair)?: A Little Help needed to walk in hospital room?: A Little Help needed climbing 3-5 steps with a railing? : A Lot 6 Click Score: 17    End of  Session Equipment Utilized During Treatment: Gait belt Activity Tolerance: Patient limited by fatigue;Patient limited by pain Patient left: with call bell/phone within reach;in bed;with bed alarm set Nurse Communication: Mobility status;Patient requests pain meds PT Visit Diagnosis: Other abnormalities of gait and mobility (R26.89)     Time: [id number] PT Time Calculation (min) (ACUTE ONLY): 24 min  Charges:  $Gait Training: 8-22 mins $Therapeutic Exercise: 8-22 mins                    Arlyce Dice, DPT Acute Rehabilitation Services Office: [phone]  [place] E 08/27/2019, 2:56 PM

## 2019-08-27 NOTE — Progress Notes (Signed)
Physical Therapy Treatment Patient Details Name: Jennifer [name] MRN: [id number] DOB: [birth year] Today's Date: 08/27/2019    History of Present Illness Patient is 70 y.o. female s/p Rt TKA on 08/25/19 with PMH significant for OSA, HTN, GERD, fibromyalgia, DM, OA, anemia, prior knee arthroscopy, brain surgery for chiari malformation.    PT Comments    Pt again limited with mobility due to pain and nausea, mostly nausea.  Pt also trying to assist as much as possible however does not look like she feels well.  Pt currently min assist for mobility.    Follow Up Recommendations  Follow surgeon's recommendation for DC plan and follow-up therapies     Equipment Recommendations  None recommended by PT    Recommendations for Other Services       Precautions / Restrictions Precautions Precautions: Fall;Knee Required Braces or Orthoses: Knee Immobilizer - Right Restrictions Weight Bearing Restrictions: No    Mobility  Bed Mobility Overal bed mobility: Needs Assistance Bed Mobility: Supine to Sit     Supine to sit: Min assist;HOB elevated     General bed mobility comments: assist for R LE  Transfers Overall transfer level: Needs assistance Equipment used: Rolling walker (2 wheeled) Transfers: Sit to/from Stand Sit to Stand: Min assist         General transfer comment: verbal cues for UE and LE positioning, assist to rise, steady, and control descent  Ambulation/Gait Ambulation/Gait assistance: Min assist Gait Distance (Feet): 24 Feet Assistive device: Rolling walker (2 wheeled) Gait Pattern/deviations: Step-to pattern;Decreased stance time - right;Antalgic Gait velocity: decreased   General Gait Details: verbal cues for sequence, RW positioning, posture, step length, distance limited by pain and nausea   Stairs             Wheelchair Mobility    Modified Rankin (Stroke Patients Only)       Balance                                             Cognition Arousal/Alertness: Awake/alert Behavior During Therapy: WFL for tasks assessed/performed Overall Cognitive Status: Within Functional Limits for tasks assessed                                        Exercises Total Joint Exercises Ankle Circles/Pumps: AROM;Both;10 reps Quad Sets: AROM;10 reps;Right Heel Slides: AAROM;Right;10 reps    General Comments        Pertinent Vitals/Pain Pain Assessment: 0-10 Pain Score: 7  Pain Location: right knee Pain Descriptors / Indicators: Grimacing;Aching;Sore Pain Intervention(s): Repositioned;Premedicated before session;Monitored during session    Home Living                      Prior Function            PT Goals (current goals can now be found in the care plan section) Progress towards PT goals: Progressing toward goals    Frequency    7X/week      PT Plan Current plan remains appropriate    Co-evaluation              AM-PAC PT "6 Clicks" Mobility   Outcome Measure  Help needed turning from your back to your side while in a flat bed without using bedrails?: A  Little Help needed moving from lying on your back to sitting on the side of a flat bed without using bedrails?: A Little Help needed moving to and from a bed to a chair (including a wheelchair)?: A Little Help needed standing up from a chair using your arms (e.g., wheelchair or bedside chair)?: A Little Help needed to walk in hospital room?: A Little Help needed climbing 3-5 steps with a railing? : A Lot 6 Click Score: 17    End of Session Equipment Utilized During Treatment: Gait belt Activity Tolerance: Patient limited by fatigue;Patient limited by pain Patient left: with call bell/phone within reach;in chair;with chair alarm set   PT Visit Diagnosis: Other abnormalities of gait and mobility (R26.89)     Time: [id number] PT Time Calculation (min) (ACUTE ONLY): 22 min  Charges:  $Gait Training: 8-22 mins                     Arlyce Dice, DPT Acute Rehabilitation Services Office: [place] E 08/27/2019, 11:56 AM

## 2019-08-27 NOTE — Progress Notes (Signed)
Ms. Derflinger was not able to meet all goals with physical therapy today secondary to pain and nausea. We have switched her pain medication to Dilaudid in hopes that this may allow her to have better pain control and progress with PT tomorrow. She will need to stay overnight to continue working with PT towards safe discharge home, hopefully tomorrow.   Griffith Citron, PA-C Orthopedic Surgery [facility] [place] [phone]

## 2019-08-27 NOTE — Progress Notes (Signed)
Subjective: 2 Days Post-Op Procedure(s) (LRB): RIGHT TOTAL KNEE ARTHROPLASTY (Right) Patient reports pain as mild.   Patient seen in rounds for Dr. Wynelle Link. Patient is well, and has had no acute complaints or problems other than discomfort in the right knee. No acute events overnight. She states she was able to void yesterday, positive flatus. Denies CP, SHOB. She is set up for OPPT tomorrow.  Plan is to go Home after hospital stay.  Objective: Vital signs in last 24 hours: Temp:  [98 F (36.7 C)-98.9 F (37.2 C)] 98.9 F (37.2 C) (05/05 0456) Pulse Rate:  [72-93] 93 (05/05 0456) Resp:  [14-17] 14 (05/05 0456) BP: (115-142)/(53-82) 125/53 (05/05 0456) SpO2:  [83 %-99 %] 93 % (05/05 0456)  Intake/Output from previous day:  Intake/Output Summary (Last 24 hours) at 08/27/2019 0850 Last data filed at 08/27/2019 0827 Gross per 24 hour  Intake 3587.53 ml  Output 1300 ml  Net 2287.53 ml    Intake/Output this shift: Total I/O In: 113.9 [I.V.:113.9] Out: -   Labs: Recent Labs    08/26/19 0307 08/27/19 0304  HGB 12.2 10.8*   Recent Labs    08/26/19 0307 08/27/19 0304  WBC 8.1 13.7*  RBC 4.30 3.78*  HCT 39.3 34.5*  PLT 247 218   Recent Labs    08/26/19 0307 08/27/19 0304  NA 138 139  K 4.2 4.0  CL 104 103  CO2 23 30  BUN 17 21  CREATININE 0.97 0.86  GLUCOSE 157* 141*  CALCIUM 8.1* 8.2*   No results for input(s): LABPT, INR in the last 72 hours.  Exam: General - Patient is Alert and Oriented Extremity - Neurologically intact Sensation intact distally Intact pulses distally Dorsiflexion/Plantar flexion intact Dressing/Incision - clean, dry, no drainage Motor Function - intact, moving foot and toes well on exam.   Past Medical History:  Diagnosis Date  . Anemia   . Arthritis   . Chronic chest pain   . Chronic fatigue 02/03/2017  . Colon polyps    inflamed partially ulcerated hyperplastic polyp  . Diabetes ([place]) 09/24/2013  . Diverticulosis   .  Dysphagia esophageal phase - chronic after 3 fundoplications [id number]  . Esophageal stenosis    Stricture after fundoplication REDO  . Fibromyalgia   . GERD (gastroesophageal reflux disease)   . Heart murmur    no problems   . Hemorrhoids, internal, with bleeding 06/27/2017  . History of hiatal hernia   . Hypertension   . IBS (irritable bowel syndrome)   . Mitral valve prolapse   . OSA (obstructive sleep apnea)    on CPAP-Mild - Sleep study 2004  . Pulmonary embolus ([place])    2010 and 12-2013  . Sleep apnea    wears CPAP  . Thyroid nodule      dr. watching   . Tinnitus    Subjective  . Zoster 2014   R flank    Assessment/Plan: 2 Days Post-Op Procedure(s) (LRB): RIGHT TOTAL KNEE ARTHROPLASTY (Right) Principal Problem:   OA (osteoarthritis) of knee Active Problems:   Primary osteoarthritis of right knee  Estimated body mass index is 38.36 kg/m as calculated from the following:   Height as of this encounter: 5' 8.5" (1.74 m).   Weight as of this encounter: 116.1 kg. Advance diet Up with therapy D/C IV fluids  DVT Prophylaxis - Xarelto Weight-bearing as tolerated  Hemoglobin stable at 10.8 today. Plan for likely discharge today following 1-2 sessions of therapy as long as she is  meeting her goals. She is scheduled for OPPT tomorrow at [facility]. Aquacel to remain in place until follow up. Follow up in the office in 2 weeks.   Griffith Citron, PA-C Orthopedic Surgery [phone] 08/27/2019, 8:50 AM

## 2019-08-27 NOTE — Progress Notes (Deleted)
Subjective: 2 Days Post-Op Procedure(s) (LRB): RIGHT TOTAL KNEE ARTHROPLASTY (Right) Patient reports pain as mild.   Patient seen in rounds for Dr. Wynelle Link. Patient is well, and has had no acute complaints or problems Plan is to go Home after hospital stay.  Objective: Vital signs in last 24 hours: Temp:  [98 F (36.7 C)-98.9 F (37.2 C)] 98.9 F (37.2 C) (05/05 0456) Pulse Rate:  [72-93] 93 (05/05 0456) Resp:  [14-17] 14 (05/05 0456) BP: (115-142)/(53-82) 125/53 (05/05 0456) SpO2:  [83 %-99 %] 93 % (05/05 0456)  Intake/Output from previous day:  Intake/Output Summary (Last 24 hours) at 08/27/2019 0815 Last data filed at 08/27/2019 0527 Gross per 24 hour  Intake 3473.64 ml  Output 1300 ml  Net 2173.64 ml    Intake/Output this shift: No intake/output data recorded.  Labs: Recent Labs    08/26/19 0307 08/27/19 0304  HGB 12.2 10.8*   Recent Labs    08/26/19 0307 08/27/19 0304  WBC 8.1 13.7*  RBC 4.30 3.78*  HCT 39.3 34.5*  PLT 247 218   Recent Labs    08/26/19 0307 08/27/19 0304  NA 138 139  K 4.2 4.0  CL 104 103  CO2 23 30  BUN 17 21  CREATININE 0.97 0.86  GLUCOSE 157* 141*  CALCIUM 8.1* 8.2*   No results for input(s): LABPT, INR in the last 72 hours.  Exam: General - Patient is Alert and Oriented Extremity - Neurologically intact Sensation intact distally Intact pulses distally Dorsiflexion/Plantar flexion intact Dressing/Incision - clean, dry, no drainage Motor Function - intact, moving foot and toes well on exam.   Past Medical History:  Diagnosis Date  . Anemia   . Arthritis   . Chronic chest pain   . Chronic fatigue 02/03/2017  . Colon polyps    inflamed partially ulcerated hyperplastic polyp  . Diabetes ([place]) 09/24/2013  . Diverticulosis   . Dysphagia esophageal phase - chronic after 3 fundoplications [id number]  . Esophageal stenosis    Stricture after fundoplication REDO  . Fibromyalgia   . GERD (gastroesophageal reflux disease)    . Heart murmur    no problems   . Hemorrhoids, internal, with bleeding 06/27/2017  . History of hiatal hernia   . Hypertension   . IBS (irritable bowel syndrome)   . Mitral valve prolapse   . OSA (obstructive sleep apnea)    on CPAP-Mild - Sleep study 2004  . Pulmonary embolus ([place])    2010 and 12-2013  . Sleep apnea    wears CPAP  . Thyroid nodule      dr. watching   . Tinnitus    Subjective  . Zoster 2014   R flank    Assessment/Plan: 2 Days Post-Op Procedure(s) (LRB): RIGHT TOTAL KNEE ARTHROPLASTY (Right) Principal Problem:   OA (osteoarthritis) of knee Active Problems:   Primary osteoarthritis of right knee  Estimated body mass index is 38.36 kg/m as calculated from the following:   Height as of this encounter: 5' 8.5" (1.74 m).   Weight as of this encounter: 116.1 kg. Advance diet Up with therapy D/C IV fluids  DVT Prophylaxis - Xarelto Weight-bearing as tolerated  Ms. Wubben is doing well this morning. Hemoglobin stable at 10.8. Plan for likely discharge today following 1-2 sessions of physical therapy as long as she is meeting her goals and safe for discharge home. Scheduled for OPPT at [facility] tomorrow. Follow up in the office in 2 weeks.   Griffith Citron, PA-C Orthopedic  Surgery [phone] 08/27/2019, 8:15 AM

## 2019-08-27 NOTE — Progress Notes (Signed)
Orthopedic Tech Progress Note Patient Details:  Jennifer [name] May 25, 1950 [id number]  CPM Right Knee CPM Right Knee: Off Right Knee Flexion (Degrees): 40 Right Knee Extension (Degrees): 10 Additional Comments: Pt in pain  Post Interventions Patient Tolerated: Unable to use device properly Instructions Provided: Adjustment of device, Care of device  [name] 08/27/2019, 4:52 PM

## 2019-08-28 LAB — CBC
HCT: 33.3 % — ABNORMAL LOW (ref 36.0–46.0)
Hemoglobin: 10.4 g/dL — ABNORMAL LOW (ref 12.0–15.0)
MCH: 28.7 pg (ref 26.0–34.0)
MCHC: 31.2 g/dL (ref 30.0–36.0)
MCV: 92 fL (ref 80.0–100.0)
Platelets: 213 10*3/uL (ref 150–400)
RBC: 3.62 MIL/uL — ABNORMAL LOW (ref 3.87–5.11)
RDW: 15.8 % — ABNORMAL HIGH (ref 11.5–15.5)
WBC: 9.8 10*3/uL (ref 4.0–10.5)
nRBC: 0 % (ref 0.0–0.2)

## 2019-08-28 NOTE — Progress Notes (Signed)
Physical Therapy Treatment Patient Details Name: Jennifer [name] MRN: [id number] DOB: 1950/09/12 Today's Date: 08/28/2019    History of Present Illness Patient is 69 y.o. female s/p Rt TKA on 08/25/19 with PMH significant for OSA, HTN, GERD, fibromyalgia, DM, OA, anemia, prior knee arthroscopy, brain surgery for chiari malformation.    PT Comments    Pt continues cooperative but progressing slowly with mobility 2* c/o fatigue/dizziness (BP 128/72) and limited WB tolerance on R LE 2* pain.   Follow Up Recommendations  Follow surgeon's recommendation for DC plan and follow-up therapies;Home health PT     Equipment Recommendations  None recommended by PT    Recommendations for Other Services       Precautions / Restrictions Precautions Precautions: Fall;Knee Required Braces or Orthoses: Knee Immobilizer - Right Knee Immobilizer - Right: Discontinue once straight leg raise with < 10 degree lag Restrictions Weight Bearing Restrictions: No Other Position/Activity Restrictions: WBAT    Mobility  Bed Mobility Overal bed mobility: Needs Assistance Bed Mobility: Sit to Supine       Sit to supine: Min assist   General bed mobility comments: increased time with cues for sequence and physical assist to manage R LE  Transfers Overall transfer level: Needs assistance Equipment used: Rolling walker (2 wheeled) Transfers: Sit to/from Stand Sit to Stand: Min assist         General transfer comment: verbal cues for UE and LE positioning, assist to rise, steady, and control descent  Ambulation/Gait Ambulation/Gait assistance: Min assist Gait Distance (Feet): 26 Feet Assistive device: Rolling walker (2 wheeled) Gait Pattern/deviations: Step-to pattern;Decreased stance time - right;Antalgic Gait velocity: decreased   General Gait Details: verbal cues for sequence, RW positioning, posture, step length, distance limited by pain and dizziness - BP 128/72; pt tolerating min WB on R  LE   Stairs             Wheelchair Mobility    Modified Rankin (Stroke Patients Only)       Balance Overall balance assessment: Needs assistance Sitting-balance support: No upper extremity supported;Feet supported Sitting balance-Leahy Scale: Good     Standing balance support: Bilateral upper extremity supported Standing balance-Leahy Scale: Poor                              Cognition Arousal/Alertness: Awake/alert Behavior During Therapy: WFL for tasks assessed/performed Overall Cognitive Status: Within Functional Limits for tasks assessed                                        Exercises Total Joint Exercises Ankle Circles/Pumps: AROM;Both;15 reps;Supine Quad Sets: AROM;10 reps;Right Heel Slides: AAROM;Right;10 reps;Supine Straight Leg Raises: AAROM;Right;10 reps;Supine    General Comments        Pertinent Vitals/Pain Pain Assessment: 0-10 Pain Score: 8  Pain Location: right knee with attempts to WB Pain Descriptors / Indicators: Grimacing;Aching;Sore Pain Intervention(s): Limited activity within patient's tolerance;Monitored during session;Premedicated before session;Ice applied    Home Living                      Prior Function            PT Goals (current goals can now be found in the care plan section) Acute Rehab PT Goals PT Goal Formulation: With patient Time For Goal Achievement: 09/09/19 Potential to Achieve Goals:  Good Progress towards PT goals: Progressing toward goals    Frequency    7X/week      PT Plan Current plan remains appropriate    Co-evaluation              AM-PAC PT "6 Clicks" Mobility   Outcome Measure  Help needed turning from your back to your side while in a flat bed without using bedrails?: A Little Help needed moving from lying on your back to sitting on the side of a flat bed without using bedrails?: A Little Help needed moving to and from a bed to a chair  (including a wheelchair)?: A Little Help needed standing up from a chair using your arms (e.g., wheelchair or bedside chair)?: A Little Help needed to walk in hospital room?: A Little Help needed climbing 3-5 steps with a railing? : A Lot 6 Click Score: 17    End of Session Equipment Utilized During Treatment: Gait belt;Right knee immobilizer Activity Tolerance: Patient limited by fatigue;Patient limited by pain Patient left: in bed;with call bell/phone within reach;with family/visitor present Nurse Communication: Mobility status;Patient requests pain meds PT Visit Diagnosis: Other abnormalities of gait and mobility (R26.89)     Time: 1442-1530 PT Time Calculation (min) (ACUTE ONLY): 48 min  Charges:  $Gait Training: 8-22 mins $Therapeutic Exercise: 8-22 mins $Therapeutic Activity: 8-22 mins                     Debe Coder PT Acute Rehabilitation Services Pager [phone] Office [phone]    [name] 08/28/2019, 4:41 PM

## 2019-08-28 NOTE — Progress Notes (Signed)
Subjective: 3 Days Post-Op Procedure(s) (LRB): RIGHT TOTAL KNEE ARTHROPLASTY (Right) Patient reports pain as mild.   Patient seen in rounds with Dr. Wynelle Link. Patient is well this AM. Pain well controlled with medications. Denies chest pain or SOB. No issues overnight. Voiding without difficulty and positive flatus.  Plan is to go Home after hospital stay.  Objective: Vital signs in last 24 hours: Temp:  [98.9 F (37.2 C)-100.2 F (37.9 C)] 99 F (37.2 C) (05/06 0649) Pulse Rate:  [88-102] 94 (05/06 0649) Resp:  [16-20] 18 (05/06 0602) BP: (129-144)/(63-70) 144/70 (05/06 0602) SpO2:  [90 %-96 %] 96 % (05/06 0649)  Intake/Output from previous day:  Intake/Output Summary (Last 24 hours) at 08/28/2019 0748 Last data filed at 08/28/2019 0600 Gross per 24 hour  Intake 693.89 ml  Output 500 ml  Net 193.89 ml    Intake/Output this shift: No intake/output data recorded.  Labs: Recent Labs    08/26/19 0307 08/27/19 0304 08/28/19 0259  HGB 12.2 10.8* 10.4*   Recent Labs    08/27/19 0304 08/28/19 0259  WBC 13.7* 9.8  RBC 3.78* 3.62*  HCT 34.5* 33.3*  PLT 218 213   Recent Labs    08/26/19 0307 08/27/19 0304  NA 138 139  K 4.2 4.0  CL 104 103  CO2 23 30  BUN 17 21  CREATININE 0.97 0.86  GLUCOSE 157* 141*  CALCIUM 8.1* 8.2*   No results for input(s): LABPT, INR in the last 72 hours.  Exam: General - Patient is Alert and Oriented Extremity - Neurologically intact Neurovascular intact Sensation intact distally Dorsiflexion/Plantar flexion intact Dressing/Incision - clean, dry, no drainage Motor Function - intact, moving foot and toes well on exam.   Past Medical History:  Diagnosis Date  . Anemia   . Arthritis   . Chronic chest pain   . Chronic fatigue 02/03/2017  . Colon polyps    inflamed partially ulcerated hyperplastic polyp  . Diabetes ([place]) 09/24/2013  . Diverticulosis   . Dysphagia esophageal phase - chronic after 3 fundoplications [id number]  .  Esophageal stenosis    Stricture after fundoplication REDO  . Fibromyalgia   . GERD (gastroesophageal reflux disease)   . Heart murmur    no problems   . Hemorrhoids, internal, with bleeding 06/27/2017  . History of hiatal hernia   . Hypertension   . IBS (irritable bowel syndrome)   . Mitral valve prolapse   . OSA (obstructive sleep apnea)    on CPAP-Mild - Sleep study 2004  . Pulmonary embolus ([place])    2010 and 12-2013  . Sleep apnea    wears CPAP  . Thyroid nodule      dr. watching   . Tinnitus    Subjective  . Zoster 2014   R flank    Assessment/Plan: 3 Days Post-Op Procedure(s) (LRB): RIGHT TOTAL KNEE ARTHROPLASTY (Right) Principal Problem:   OA (osteoarthritis) of knee Active Problems:   Primary osteoarthritis of right knee   S/P total knee arthroplasty, right  Estimated body mass index is 38.36 kg/m as calculated from the following:   Height as of this encounter: 5' 8.5" (1.74 m).   Weight as of this encounter: 116.1 kg. Up with therapy  DVT Prophylaxis - Xarelto Weight-bearing as tolerated  Plan for discharge later today once cleared by PT. Scheduled for outpatient PT at [facility], will move appointment to Monday.  Follow-up in the office in 2 weeks.   Theresa Duty, PA-C Orthopedic Surgery [phone] 08/28/2019,  7:48 AM

## 2019-08-28 NOTE — Progress Notes (Signed)
Physical Therapy Treatment Patient Details Name: Jennifer [name] MRN: [id number] DOB: 30-Jan-1951 Today's Date: 08/28/2019    History of Present Illness Patient is 69 y.o. female s/p Rt TKA on 08/25/19 with PMH significant for OSA, HTN, GERD, fibromyalgia, DM, OA, anemia, prior knee arthroscopy, brain surgery for chiari malformation.    PT Comments    Pt is cooperative but initially lethargic and is limited by increased pain with activity and with min WB tolerance on R LE 2* pain.   Follow Up Recommendations  Follow surgeon's recommendation for DC plan and follow-up therapies;Home health PT     Equipment Recommendations  None recommended by PT    Recommendations for Other Services       Precautions / Restrictions Precautions Precautions: Fall;Knee Required Braces or Orthoses: Knee Immobilizer - Right Knee Immobilizer - Right: Discontinue once straight leg raise with < 10 degree lag Restrictions Weight Bearing Restrictions: No Other Position/Activity Restrictions: WBAT    Mobility  Bed Mobility               General bed mobility comments: Up in chair with nursing  Transfers Overall transfer level: Needs assistance Equipment used: Rolling walker (2 wheeled) Transfers: Sit to/from Stand Sit to Stand: Mod assist         General transfer comment: verbal cues for UE and LE positioning, assist to rise, steady, and control descent  Ambulation/Gait Ambulation/Gait assistance: Min assist Gait Distance (Feet): 17 Feet Assistive device: Rolling walker (2 wheeled) Gait Pattern/deviations: Step-to pattern;Decreased stance time - right;Antalgic     General Gait Details: verbal cues for sequence, RW positioning, posture, step length, distance limited by pain and dizziness; pt tolerating min WB on R LE   Stairs             Wheelchair Mobility    Modified Rankin (Stroke Patients Only)       Balance Overall balance assessment: Needs assistance Sitting-balance  support: No upper extremity supported;Feet supported Sitting balance-Leahy Scale: Good     Standing balance support: Bilateral upper extremity supported Standing balance-Leahy Scale: Poor                              Cognition Arousal/Alertness: Awake/alert Behavior During Therapy: WFL for tasks assessed/performed Overall Cognitive Status: Within Functional Limits for tasks assessed                                 General Comments: Initially lethargic but then perked up; ltd by pain with activity      Exercises Total Joint Exercises Ankle Circles/Pumps: AROM;Both;15 reps;Supine Quad Sets: AROM;10 reps;Right Heel Slides: AAROM;Right;10 reps;Supine Straight Leg Raises: AAROM;Right;10 reps;Supine    General Comments        Pertinent Vitals/Pain Pain Assessment: 0-10 Pain Score: 10-Worst pain ever Pain Location: right knee with attempts to WB Pain Descriptors / Indicators: Grimacing;Aching;Sore(ltd meds 2* lethargy) Pain Intervention(s): Limited activity within patient's tolerance;Monitored during session;Ice applied    Home Living                      Prior Function            PT Goals (current goals can now be found in the care plan section) Acute Rehab PT Goals PT Goal Formulation: With patient Time For Goal Achievement: 09/09/19 Potential to Achieve Goals: Good Progress towards PT goals: Progressing  toward goals    Frequency    7X/week      PT Plan Current plan remains appropriate    Co-evaluation              AM-PAC PT "6 Clicks" Mobility   Outcome Measure  Help needed turning from your back to your side while in a flat bed without using bedrails?: A Little Help needed moving from lying on your back to sitting on the side of a flat bed without using bedrails?: A Little Help needed moving to and from a bed to a chair (including a wheelchair)?: A Little Help needed standing up from a chair using your arms (e.g.,  wheelchair or bedside chair)?: A Lot Help needed to walk in hospital room?: A Little Help needed climbing 3-5 steps with a railing? : A Lot 6 Click Score: 16    End of Session Equipment Utilized During Treatment: Gait belt;Right knee immobilizer Activity Tolerance: Patient limited by fatigue;Patient limited by pain Patient left: in chair;with call bell/phone within reach;with chair alarm set Nurse Communication: Mobility status;Patient requests pain meds PT Visit Diagnosis: Other abnormalities of gait and mobility (R26.89)     Time: [id number] PT Time Calculation (min) (ACUTE ONLY): 32 min  Charges:  $Gait Training: 8-22 mins $Therapeutic Exercise: 8-22 mins                     Debe Coder PT Acute Rehabilitation Services Pager [phone] Office [phone]    Jennifer [name] 08/28/2019, 12:59 PM

## 2019-08-28 NOTE — Progress Notes (Signed)
Orthopedic Tech Progress Note Patient Details:  Jennifer [name] 05-Apr-1951 [id number]  Ortho Devices Type of Ortho Device: CPM padding Ortho Device/Splint Interventions: Application, Ordered   Post Interventions Patient Tolerated: Unable to use device properly Instructions Provided: Adjustment of device, Care of device   [name] 08/28/2019, 3:43 PM

## 2019-08-29 MED ORDER — HYDROMORPHONE HCL 2 MG PO TABS: 2.0000 mg | ORAL_TABLET | Freq: Four times a day (QID) | ORAL | 0 refills | Status: AC | PRN

## 2019-08-29 NOTE — Progress Notes (Signed)
Physical Therapy Treatment Patient Details Name: Jennifer [name] MRN: [id number] DOB: 02/04/1951 Today's Date: 08/29/2019    History of Present Illness Patient is 69 y.o. female s/p Rt TKA on 08/25/19 with PMH significant for OSA, HTN, GERD, fibromyalgia, DM, OA, anemia, prior knee arthroscopy, brain surgery for chiari malformation.    PT Comments    Pt more alert and continues very cooperative.  Pt progressing slowly but with noted increased activity tolerance and slowly improving WB tolerance on R LE.  Pt reviewed don/doff KI.  Pt again requesting HHPT and reports she does not think she has an OP PT appt set up - pt to clarify with spouse if this is the case.  Follow Up Recommendations  Follow surgeon's recommendation for DC plan and follow-up therapies;Home health PT     Equipment Recommendations  None recommended by PT    Recommendations for Other Services       Precautions / Restrictions Precautions Precautions: Fall;Knee Required Braces or Orthoses: Knee Immobilizer - Right Knee Immobilizer - Right: Discontinue once straight leg raise with < 10 degree lag Restrictions Weight Bearing Restrictions: Yes Other Position/Activity Restrictions: WBAT    Mobility  Bed Mobility Overal bed mobility: Needs Assistance Bed Mobility: Supine to Sit     Supine to sit: Min assist;HOB elevated     General bed mobility comments: increased time with cues for sequence and physical assist to manage R LE  Transfers Overall transfer level: Needs assistance Equipment used: Rolling walker (2 wheeled) Transfers: Sit to/from Stand Sit to Stand: Min guard;From elevated surface         General transfer comment: cues for LE management and use of UEs to self assist  Ambulation/Gait Ambulation/Gait assistance: Min assist Gait Distance (Feet): 39 Feet Assistive device: Rolling walker (2 wheeled) Gait Pattern/deviations: Step-to pattern;Decreased stance time - right;Antalgic Gait velocity:  decreased   General Gait Details: verbal cues for sequence, RW positioning, posture, step length, distance limited by pain and c/o dizziness   Stairs             Wheelchair Mobility    Modified Rankin (Stroke Patients Only)       Balance Overall balance assessment: Needs assistance Sitting-balance support: No upper extremity supported;Feet supported Sitting balance-Leahy Scale: Good     Standing balance support: Bilateral upper extremity supported Standing balance-Leahy Scale: Poor                              Cognition Arousal/Alertness: Awake/alert Behavior During Therapy: WFL for tasks assessed/performed Overall Cognitive Status: Within Functional Limits for tasks assessed                                        Exercises Total Joint Exercises Ankle Circles/Pumps: AROM;Both;15 reps;Supine Quad Sets: AROM;Right;15 reps;Supine Heel Slides: AAROM;Right;Supine;20 reps Straight Leg Raises: AAROM;Right;Supine;20 reps Goniometric ROM: AAROM R knee -5 - 65    General Comments        Pertinent Vitals/Pain Pain Assessment: 0-10 Pain Score: 8  Pain Location: right knee with WB Pain Descriptors / Indicators: Grimacing;Aching;Sore Pain Intervention(s): Limited activity within patient's tolerance;Monitored during session;Premedicated before session;Ice applied    Home Living                      Prior Function  PT Goals (current goals can now be found in the care plan section) Acute Rehab PT Goals PT Goal Formulation: With patient Time For Goal Achievement: 09/09/19 Potential to Achieve Goals: Good Progress towards PT goals: Progressing toward goals    Frequency    7X/week      PT Plan Current plan remains appropriate    Co-evaluation              AM-PAC PT "6 Clicks" Mobility   Outcome Measure  Help needed turning from your back to your side while in a flat bed without using bedrails?: A  Little Help needed moving from lying on your back to sitting on the side of a flat bed without using bedrails?: A Little Help needed moving to and from a bed to a chair (including a wheelchair)?: A Little Help needed standing up from a chair using your arms (e.g., wheelchair or bedside chair)?: A Little Help needed to walk in hospital room?: A Little Help needed climbing 3-5 steps with a railing? : A Lot 6 Click Score: 17    End of Session Equipment Utilized During Treatment: Gait belt;Right knee immobilizer Activity Tolerance: Patient limited by fatigue;Patient limited by pain Patient left: in chair;with call bell/phone within reach;with chair alarm set Nurse Communication: Mobility status PT Visit Diagnosis: Other abnormalities of gait and mobility (R26.89)     Time: [id number] PT Time Calculation (min) (ACUTE ONLY): 46 min  Charges:  $Gait Training: 8-22 mins $Therapeutic Exercise: 8-22 mins $Therapeutic Activity: 8-22 mins                     Debe Coder PT Acute Rehabilitation Services Pager [phone] Office [phone]    Jennifer [name] 08/29/2019, 10:31 AM

## 2019-08-29 NOTE — Care Management Important Message (Signed)
Important Message  Patient Details IM Letter given to [place] Case Manager to present to the Patient Name: ALIKI SANTIZO MRN: [id number] Date of Birth: [birth year]   Medicare Important Message Given:  Yes     Kerin Salen 08/29/2019, 12:00 PM

## 2019-08-29 NOTE — Progress Notes (Signed)
Physical Therapy Treatment Patient Details Name: Jennifer [name] MRN: [id number] DOB: Jan 23, 1951 Today's Date: 08/29/2019    History of Present Illness Patient is 69 y.o. female s/p Rt TKA on 08/25/19 with PMH significant for OSA, HTN, GERD, fibromyalgia, DM, OA, anemia, prior knee arthroscopy, brain surgery for chiari malformation.    PT Comments    Pt continues slow steady progress with mobility.  Pt spouse present for family ed regarding HEP, stairs and don/doff KI.  Pt continues very cooperative with requiring increased time and fatigues easily.  Pt would benefit from follow up HHPT for safety and to allow pt to expend limited energy on therapy focussed on TKR rehab vs prep for and transit to OP PT.   Follow Up Recommendations  Follow surgeon's recommendation for DC plan and follow-up therapies;Home health PT     Equipment Recommendations  None recommended by PT    Recommendations for Other Services       Precautions / Restrictions Precautions Precautions: Fall;Knee Required Braces or Orthoses: Knee Immobilizer - Right Knee Immobilizer - Right: Discontinue once straight leg raise with < 10 degree lag Restrictions Weight Bearing Restrictions: No Other Position/Activity Restrictions: WBAT    Mobility  Bed Mobility               General bed mobility comments: NT - pt up in chair and returned to same at session end  Transfers Overall transfer level: Needs assistance Equipment used: Rolling walker (2 wheeled) Transfers: Sit to/from Stand Sit to Stand: Min guard         General transfer comment: cues for LE management and use of UEs to self assist  Ambulation/Gait Ambulation/Gait assistance: Min assist Gait Distance (Feet): 15 Feet Assistive device: Rolling walker (2 wheeled) Gait Pattern/deviations: Step-to pattern;Decreased stance time - right;Antalgic Gait velocity: decreased   General Gait Details: verbal cues for sequence, RW positioning, posture, step length,  distance limited by pain and fatigue   Stairs Stairs: Yes Stairs assistance: Min assist;Mod assist;+2 safety/equipment Stair Management: No rails;Step to pattern;Backwards;With walker Number of Stairs: 1 General stair comments: Increased time; cues for sequence and foot/RW placement.     Wheelchair Mobility    Modified Rankin (Stroke Patients Only)       Balance Overall balance assessment: Needs assistance Sitting-balance support: No upper extremity supported;Feet supported Sitting balance-Leahy Scale: Good     Standing balance support: Bilateral upper extremity supported Standing balance-Leahy Scale: Fair                              Cognition Arousal/Alertness: Awake/alert Behavior During Therapy: WFL for tasks assessed/performed Overall Cognitive Status: Within Functional Limits for tasks assessed                                        Exercises Total Joint Exercises Ankle Circles/Pumps: AROM;Both;15 reps;Supine Quad Sets: AROM;Right;15 reps;Supine Short Arc Quad: AAROM;Right;10 reps Heel Slides: AAROM;Right;Supine;15 reps Hip ABduction/ADduction: AAROM;Right;10 reps Straight Leg Raises: AAROM;Right;Supine;15 reps    General Comments        Pertinent Vitals/Pain Pain Assessment: 0-10 Pain Score: 8  Pain Location: right knee with WB Pain Descriptors / Indicators: Grimacing;Aching;Sore Pain Intervention(s): Premedicated before session;Monitored during session;Limited activity within patient's tolerance;Ice applied    Home Living  Prior Function            PT Goals (current goals can now be found in the care plan section) Acute Rehab PT Goals PT Goal Formulation: With patient Time For Goal Achievement: 09/09/19 Potential to Achieve Goals: Good Progress towards PT goals: Progressing toward goals    Frequency    7X/week      PT Plan Current plan remains appropriate    Co-evaluation               AM-PAC PT "6 Clicks" Mobility   Outcome Measure  Help needed turning from your back to your side while in a flat bed without using bedrails?: A Little Help needed moving from lying on your back to sitting on the side of a flat bed without using bedrails?: A Little Help needed moving to and from a bed to a chair (including a wheelchair)?: A Little Help needed standing up from a chair using your arms (e.g., wheelchair or bedside chair)?: A Little Help needed to walk in hospital room?: A Little Help needed climbing 3-5 steps with a railing? : A Lot 6 Click Score: 17    End of Session Equipment Utilized During Treatment: Gait belt;Right knee immobilizer Activity Tolerance: Patient limited by fatigue;Patient limited by pain Patient left: in chair;with call bell/phone within reach;with chair alarm set;with family/visitor present Nurse Communication: Mobility status PT Visit Diagnosis: Other abnormalities of gait and mobility (R26.89)     Time: [id number] PT Time Calculation (min) (ACUTE ONLY): 42 min  Charges:  $Gait Training: 8-22 mins $Therapeutic Exercise: 8-22 mins $Therapeutic Activity: 8-22 mins                     Debe Coder PT Acute Rehabilitation Services Pager [phone] Office [phone]    Jennifer [name] 08/29/2019, 5:25 PM

## 2019-08-29 NOTE — Progress Notes (Signed)
Physical Therapy Treatment Patient Details Name: Jennifer [name] MRN: [id number] DOB: [birth year] Today's Date: 08/29/2019    History of Present Illness Patient is 69 y.o. female s/p Rt TKA on 08/25/19 with PMH significant for OSA, HTN, GERD, fibromyalgia, DM, OA, anemia, prior knee arthroscopy, brain surgery for chiari malformation.    PT Comments    Pt continues very cooperative but requiring increased time for all tasks and fatigues very easily.  This session, pt able to ambulate short distance and negotiate 2 steps with increased time, cues and encouragement and increased assist level.  Following descent from stairs, pt transferred to chair to return to room 2* fatigue.   Follow Up Recommendations  Follow surgeon's recommendation for DC plan and follow-up therapies;Home health PT     Equipment Recommendations       Recommendations for Other Services       Precautions / Restrictions Precautions Precautions: Fall;Knee Required Braces or Orthoses: Knee Immobilizer - Right Knee Immobilizer - Right: Discontinue once straight leg raise with < 10 degree lag Restrictions Weight Bearing Restrictions: No Other Position/Activity Restrictions: WBAT    Mobility  Bed Mobility Overal bed mobility: Needs Assistance Bed Mobility: Supine to Sit     Supine to sit: HOB elevated;Min guard     General bed mobility comments: increased time with cues for sequence; Pt self assisting R LE with UEs  Transfers Overall transfer level: Needs assistance Equipment used: Rolling walker (2 wheeled) Transfers: Sit to/from Stand Sit to Stand: Min guard         General transfer comment: cues for LE management and use of UEs to self assist  Ambulation/Gait Ambulation/Gait assistance: Min assist Gait Distance (Feet): 20 Feet Assistive device: Rolling walker (2 wheeled) Gait Pattern/deviations: Step-to pattern;Decreased stance time - right;Antalgic Gait velocity: decreased   General Gait Details:  verbal cues for sequence, RW positioning, posture, step length, distance limited by pain and fatigue   Stairs Stairs: Yes Stairs assistance: Min assist;Mod assist;+2 safety/equipment Stair Management: No rails;Step to pattern;Backwards;With walker Number of Stairs: 2 General stair comments: Increased time; cues for sequence and foot/RW placement.     Wheelchair Mobility    Modified Rankin (Stroke Patients Only)       Balance Overall balance assessment: Needs assistance Sitting-balance support: No upper extremity supported;Feet supported Sitting balance-Leahy Scale: Good     Standing balance support: Bilateral upper extremity supported Standing balance-Leahy Scale: Fair                              Cognition Arousal/Alertness: Awake/alert Behavior During Therapy: WFL for tasks assessed/performed Overall Cognitive Status: Within Functional Limits for tasks assessed                                        Exercises      General Comments        Pertinent Vitals/Pain Pain Assessment: 0-10 Pain Score: 8  Pain Location: right knee with WB Pain Descriptors / Indicators: Grimacing;Aching;Sore Pain Intervention(s): Limited activity within patient's tolerance;Monitored during session;Premedicated before session    Home Living                      Prior Function            PT Goals (current goals can now be found in the care plan section)  Acute Rehab PT Goals PT Goal Formulation: With patient Time For Goal Achievement: 09/09/19 Potential to Achieve Goals: Good Progress towards PT goals: Progressing toward goals    Frequency    7X/week      PT Plan Current plan remains appropriate    Co-evaluation              AM-PAC PT "6 Clicks" Mobility   Outcome Measure  Help needed turning from your back to your side while in a flat bed without using bedrails?: A Little Help needed moving from lying on your back to sitting  on the side of a flat bed without using bedrails?: A Little Help needed moving to and from a bed to a chair (including a wheelchair)?: A Little Help needed standing up from a chair using your arms (e.g., wheelchair or bedside chair)?: A Little Help needed to walk in hospital room?: A Little Help needed climbing 3-5 steps with a railing? : A Lot 6 Click Score: 17    End of Session Equipment Utilized During Treatment: Gait belt;Right knee immobilizer Activity Tolerance: Patient limited by fatigue;Patient limited by pain Patient left: in chair;with call bell/phone within reach;with chair alarm set Nurse Communication: Mobility status PT Visit Diagnosis: Other abnormalities of gait and mobility (R26.89)     Time: [id number] PT Time Calculation (min) (ACUTE ONLY): 20 min  Charges:  $Gait Training: 8-22 mins                     [place] Pager [phone] Office [phone]    Tylisa Alcivar 08/29/2019, 5:19 PM

## 2019-08-29 NOTE — Progress Notes (Signed)
Subjective: 4 Days Post-Op Procedure(s) (LRB): RIGHT TOTAL KNEE ARTHROPLASTY (Right) Patient reports pain as mild.   Patient seen in rounds by Dr. Wynelle Link. Patient is well, and has had no acute complaints or problems other than discomfort in the right knee. No acute events overnight. Progressing slowly with PT, ambulated 26 feet yesterday. Denies CP, SHOB, N/V.  Plan is to go Home after hospital stay.  Objective: Vital signs in last 24 hours: Temp:  [98.4 F (36.9 C)-99.6 F (37.6 C)] 99.3 F (37.4 C) (05/07 0555) Pulse Rate:  [82-97] 82 (05/07 0555) Resp:  [16-17] 16 (05/07 0555) BP: (132-155)/(72-75) 155/74 (05/07 0555) SpO2:  [92 %-98 %] 98 % (05/07 0555)  Intake/Output from previous day:  Intake/Output Summary (Last 24 hours) at 08/29/2019 0714 Last data filed at 08/29/2019 0400 Gross per 24 hour  Intake 960 ml  Output 300 ml  Net 660 ml    Intake/Output this shift: No intake/output data recorded.  Labs: Recent Labs    08/27/19 0304 08/28/19 0259  HGB 10.8* 10.4*   Recent Labs    08/27/19 0304 08/28/19 0259  WBC 13.7* 9.8  RBC 3.78* 3.62*  HCT 34.5* 33.3*  PLT 218 213   Recent Labs    08/27/19 0304  NA 139  K 4.0  CL 103  CO2 30  BUN 21  CREATININE 0.86  GLUCOSE 141*  CALCIUM 8.2*   No results for input(s): LABPT, INR in the last 72 hours.  Exam: General - Patient is Alert and Oriented Extremity - Neurologically intact Sensation intact distally Intact pulses distally Dorsiflexion/Plantar flexion intact Dressing/Incision - clean, dry, no drainage Motor Function - intact, moving foot and toes well on exam.   Past Medical History:  Diagnosis Date  . Anemia   . Arthritis   . Chronic chest pain   . Chronic fatigue 02/03/2017  . Colon polyps    inflamed partially ulcerated hyperplastic polyp  . Diabetes ([place]) 09/24/2013  . Diverticulosis   . Dysphagia esophageal phase - chronic after 3 fundoplications [id number]  . Esophageal stenosis    Stricture after fundoplication REDO  . Fibromyalgia   . GERD (gastroesophageal reflux disease)   . Heart murmur    no problems   . Hemorrhoids, internal, with bleeding 06/27/2017  . History of hiatal hernia   . Hypertension   . IBS (irritable bowel syndrome)   . Mitral valve prolapse   . OSA (obstructive sleep apnea)    on CPAP-Mild - Sleep study 2004  . Pulmonary embolus ([place])    2010 and 12-2013  . Sleep apnea    wears CPAP  . Thyroid nodule      dr. watching   . Tinnitus    Subjective  . [place] 2014   R flank    Assessment/Plan: 4 Days Post-Op Procedure(s) (LRB): RIGHT TOTAL KNEE ARTHROPLASTY (Right) Principal Problem:   OA (osteoarthritis) of knee Active Problems:   Primary osteoarthritis of right knee   S/P total knee arthroplasty, right  Estimated body mass index is 38.36 kg/m as calculated from the following:   Height as of this encounter: 5' 8.5" (1.74 m).   Weight as of this encounter: 116.1 kg. Advance diet Up with therapy D/C IV fluids  DVT Prophylaxis - Xarelto Weight-bearing as tolerated  Plan for discharge today following 1-2 sessions of therapy as long as she is meeting her goals. We discussed with her that she needs to be motivated to work her best with therapy here and outpatient  in order to achieve the best result following this surgery. She is scheduled for OPPT on Monday. She will follow up in 2 weeks.   Griffith Citron, PA-C Orthopedic Surgery [phone] 08/29/2019, 7:14 AM

## 2019-08-29 NOTE — Plan of Care (Signed)

## 2019-08-29 NOTE — Progress Notes (Signed)
Orthopedic Tech Progress Note Patient Details:  Jennifer [name] [birth year] [id number]  Patient ID: [name], female   DOB: [birth year], 69 y.o.   MRN: [id number]   [name] 08/29/2019, 6:08 PMPatient is requesting to be put in the CPM at 11pm.

## 2019-08-30 NOTE — Plan of Care (Signed)
  Problem: Health Behavior/Discharge Planning: Goal: Ability to manage health-related needs will improve Outcome: Adequate for Discharge   Problem: Clinical Measurements: Goal: Ability to maintain clinical measurements within normal limits will improve Outcome: Adequate for Discharge Goal: Will remain free from infection Outcome: Adequate for Discharge Goal: Diagnostic test results will improve Outcome: Adequate for Discharge Goal: Respiratory complications will improve Outcome: Adequate for Discharge Goal: Cardiovascular complication will be avoided Outcome: Adequate for Discharge   Problem: Activity: Goal: Risk for activity intolerance will decrease Outcome: Adequate for Discharge   Problem: Nutrition: Goal: Adequate nutrition will be maintained Outcome: Adequate for Discharge   Problem: Coping: Goal: Level of anxiety will decrease Outcome: Adequate for Discharge   Problem: Elimination: Goal: Will not experience complications related to bowel motility Outcome: Adequate for Discharge Goal: Will not experience complications related to urinary retention Outcome: Adequate for Discharge   Problem: Pain Managment: Goal: General experience of comfort will improve Outcome: Adequate for Discharge   Problem: Safety: Goal: Ability to remain free from injury will improve Outcome: Adequate for Discharge   Problem: Skin Integrity: Goal: Risk for impaired skin integrity will decrease Outcome: Adequate for Discharge   Problem: Education: Goal: Knowledge of the prescribed therapeutic regimen will improve Outcome: Adequate for Discharge Goal: Individualized Educational Video(s) Outcome: Adequate for Discharge   Problem: Activity: Goal: Ability to avoid complications of mobility impairment will improve Outcome: Adequate for Discharge Goal: Range of joint motion will improve Outcome: Adequate for Discharge   Problem: Clinical Measurements: Goal: Postoperative complications  will be avoided or minimized Outcome: Adequate for Discharge   Problem: Pain Management: Goal: Pain level will decrease with appropriate interventions Outcome: Adequate for Discharge   Problem: Skin Integrity: Goal: Will show signs of wound healing Outcome: Adequate for Discharge   Problem: Skin Integrity: Goal: Will show signs of wound healing Outcome: Adequate for Discharge

## 2019-08-30 NOTE — Progress Notes (Signed)
Physical Therapy Treatment Patient Details Name: Jennifer [name] MRN: [id number] DOB: [birth year] Today's Date: 08/30/2019    History of Present Illness Patient is 70 y.o. female s/p Rt TKA on 08/25/19 with PMH significant for OSA, HTN, GERD, fibromyalgia, DM, OA, anemia, prior knee arthroscopy, brain surgery for chiari malformation.    PT Comments    Pt performed HEP with assist.  Pt has written instruction for home.   Follow Up Recommendations  Follow surgeon's recommendation for DC plan and follow-up therapies;Home health PT     Equipment Recommendations  None recommended by PT    Recommendations for Other Services       Precautions / Restrictions Precautions Precautions: Fall;Knee Required Braces or Orthoses: Knee Immobilizer - Right Knee Immobilizer - Right: Discontinue once straight leg raise with < 10 degree lag Restrictions Weight Bearing Restrictions: No Other Position/Activity Restrictions: WBAT    Mobility  Bed Mobility               General bed mobility comments: Pt up in chair with nursing  Transfers                    Ambulation/Gait                 Stairs             Wheelchair Mobility    Modified Rankin (Stroke Patients Only)       Balance                                            Cognition Arousal/Alertness: Awake/alert Behavior During Therapy: WFL for tasks assessed/performed Overall Cognitive Status: Within Functional Limits for tasks assessed                                        Exercises Total Joint Exercises Ankle Circles/Pumps: AROM;Both;15 reps;Supine Quad Sets: AROM;Right;15 reps;Supine Short Arc Quad: AAROM;Right;10 reps Heel Slides: AAROM;Right;Supine;20 reps Hip ABduction/ADduction: AAROM;Right;20 reps;Supine Straight Leg Raises: AAROM;Right;Supine;20 reps Goniometric ROM: AAROM R knee -5 - 65    General Comments        Pertinent Vitals/Pain Pain  Assessment: 0-10 Pain Score: 7  Pain Location: right knee  Pain Descriptors / Indicators: Grimacing;Aching;Sore Pain Intervention(s): Limited activity within patient's tolerance;Monitored during session;Premedicated before session;Ice applied    Home Living                      Prior Function            PT Goals (current goals can now be found in the care plan section) Acute Rehab PT Goals PT Goal Formulation: With patient Time For Goal Achievement: 09/09/19 Potential to Achieve Goals: Good Progress towards PT goals: Progressing toward goals    Frequency    7X/week      PT Plan Current plan remains appropriate    Co-evaluation              AM-PAC PT "6 Clicks" Mobility   Outcome Measure  Help needed turning from your back to your side while in a flat bed without using bedrails?: A Little Help needed moving from lying on your back to sitting on the side of a flat bed without using bedrails?: A Little Help needed moving  to and from a bed to a chair (including a wheelchair)?: A Little Help needed standing up from a chair using your arms (e.g., wheelchair or bedside chair)?: A Little Help needed to walk in hospital room?: A Little Help needed climbing 3-5 steps with a railing? : A Little 6 Click Score: 18    End of Session Equipment Utilized During Treatment: Gait belt;Right knee immobilizer Activity Tolerance: Patient tolerated treatment well Patient left: in chair;with call bell/phone within reach;with chair alarm set Nurse Communication: Mobility status PT Visit Diagnosis: Other abnormalities of gait and mobility (R26.89)     Time: [id number] PT Time Calculation (min) (ACUTE ONLY): 24 min  Charges:  $Therapeutic Exercise: 23-37 mins                     [place] Pager [phone] Office [phone]    Anaja Monts 08/30/2019, 1:02 PM

## 2019-08-30 NOTE — Progress Notes (Signed)
   Subjective: 5 Days Post-Op Procedure(s) (LRB): RIGHT TOTAL KNEE ARTHROPLASTY (Right) Patient reports pain as mild.   Patient is well, and has had no acute complaints or problems other than discomfort in the right knee. No acute events overnight. Progressing slowly with PT, states that she did do stairs yesterday. Denies CP, SHOB, N/V.  Plan is to go Home after hospital stay.  Objective: Vital signs in last 24 hours: Temp:  [98.2 F (36.8 C)-99.8 F (37.7 C)] 98.6 F (37 C) (05/08 0626) Pulse Rate:  [79-87] 86 (05/08 0626) Resp:  [16-18] 16 (05/08 0626) BP: (132-151)/(59-77) 151/77 (05/08 0626) SpO2:  [96 %-98 %] 96 % (05/08 0626)  Intake/Output from previous day:  Intake/Output Summary (Last 24 hours) at 08/30/2019 1005 Last data filed at 08/30/2019 0800 Gross per 24 hour  Intake 600 ml  Output 800 ml  Net -200 ml    Intake/Output this shift: No intake/output data recorded.  Labs: Recent Labs    08/28/19 0259  HGB 10.4*   Recent Labs    08/28/19 0259  WBC 9.8  RBC 3.62*  HCT 33.3*  PLT 213   No results for input(s): NA, K, CL, CO2, BUN, CREATININE, GLUCOSE, CALCIUM in the last 72 hours. No results for input(s): LABPT, INR in the last 72 hours.  Exam: General - Patient is Alert and Oriented Extremity - Neurologically intact Sensation intact distally Intact pulses distally Dorsiflexion/Plantar flexion intact Dressing/Incision - clean, dry, no drainage Motor Function - intact, moving foot and toes well on exam.   Past Medical History:  Diagnosis Date  . Anemia   . Arthritis   . Chronic chest pain   . Chronic fatigue 02/03/2017  . Colon polyps    inflamed partially ulcerated hyperplastic polyp  . Diabetes ([place]) 09/24/2013  . Diverticulosis   . Dysphagia esophageal phase - chronic after 3 fundoplications [id number]  . Esophageal stenosis    Stricture after fundoplication REDO  . Fibromyalgia   . GERD (gastroesophageal reflux disease)   . Heart murmur    no problems   . Hemorrhoids, internal, with bleeding 06/27/2017  . History of hiatal hernia   . Hypertension   . IBS (irritable bowel syndrome)   . Mitral valve prolapse   . OSA (obstructive sleep apnea)    on CPAP-Mild - Sleep study 2004  . Pulmonary embolus ([place])    2010 and 12-2013  . Sleep apnea    wears CPAP  . Thyroid nodule      dr. watching   . Tinnitus    Subjective  . [place] 2014   R flank    Assessment/Plan: 5 Days Post-Op Procedure(s) (LRB): RIGHT TOTAL KNEE ARTHROPLASTY (Right) Principal Problem:   OA (osteoarthritis) of knee Active Problems:   Primary osteoarthritis of right knee   S/P total knee arthroplasty, right  Estimated body mass index is 38.36 kg/m as calculated from the following:   Height as of this encounter: 5' 8.5" (1.74 m).   Weight as of this encounter: 116.1 kg. Advance diet Up with therapy D/C IV fluids  DVT Prophylaxis - Xarelto Weight-bearing as tolerated  Plan for discharge today following clearance of stairs with inpt PT She is scheduled for OPPT on Monday. She will follow up in 2 weeks.   Stann Mainland, MD  08/30/2019, 10:05 AM

## 2019-08-30 NOTE — Progress Notes (Signed)
Physical Therapy Treatment Patient Details Name: Jennifer [name] MRN: [id number] DOB: 01-01-1951 Today's Date: 08/30/2019    History of Present Illness Patient is 69 y.o. female s/p Rt TKA on 08/25/19 with PMH significant for OSA, HTN, GERD, fibromyalgia, DM, OA, anemia, prior knee arthroscopy, brain surgery for chiari malformation.    PT Comments    Good progress this am with mobility - pt able to ambulate increased distance in hall and negotiate steps with min assist.  Pt eager for dc home.   Follow Up Recommendations  Follow surgeon's recommendation for DC plan and follow-up therapies;Home health PT     Equipment Recommendations  None recommended by PT    Recommendations for Other Services       Precautions / Restrictions Precautions Precautions: Fall;Knee Required Braces or Orthoses: Knee Immobilizer - Right Knee Immobilizer - Right: Discontinue once straight leg raise with < 10 degree lag Restrictions Weight Bearing Restrictions: No Other Position/Activity Restrictions: WBAT    Mobility  Bed Mobility Overal bed mobility: Needs Assistance Bed Mobility: Sit to Supine       Sit to supine: Min guard   General bed mobility comments: increased time with cues for sequence  Transfers Overall transfer level: Needs assistance Equipment used: Rolling walker (2 wheeled) Transfers: Sit to/from Stand Sit to Stand: Supervision         General transfer comment: cues for LE management and use of UEs to self assist  Ambulation/Gait Ambulation/Gait assistance: Min guard Gait Distance (Feet): 72 Feet Assistive device: Rolling walker (2 wheeled) Gait Pattern/deviations: Step-to pattern;Decreased stance time - right;Antalgic     General Gait Details: verbal cues for sequence, RW positioning, posture, step length   Stairs Stairs: Yes Stairs assistance: Min assist Stair Management: No rails;Step to pattern;Backwards;With walker Number of Stairs: 4 General stair comments: 2  steps twice with RW and cues for sequence and foot/RW placement   Wheelchair Mobility    Modified Rankin (Stroke Patients Only)       Balance Overall balance assessment: Needs assistance Sitting-balance support: No upper extremity supported;Feet supported Sitting balance-Leahy Scale: Good     Standing balance support: Bilateral upper extremity supported Standing balance-Leahy Scale: Fair                              Cognition Arousal/Alertness: Awake/alert Behavior During Therapy: WFL for tasks assessed/performed Overall Cognitive Status: Within Functional Limits for tasks assessed                                        Exercises Total Joint Exercises Ankle Circles/Pumps: AROM;Both;15 reps;Supine Quad Sets: AROM;Right;15 reps;Supine Short Arc Quad: AAROM;Right;10 reps Heel Slides: AAROM;Right;Supine;20 reps Hip ABduction/ADduction: AAROM;Right;20 reps;Supine Straight Leg Raises: AAROM;Right;Supine;20 reps Goniometric ROM: AAROM R knee -5 - 65    General Comments        Pertinent Vitals/Pain Pain Assessment: 0-10 Pain Score: 7  Pain Location: right knee  Pain Descriptors / Indicators: Grimacing;Aching;Sore Pain Intervention(s): Monitored during session;Limited activity within patient's tolerance;Premedicated before session;Ice applied    Home Living                      Prior Function            PT Goals (current goals can now be found in the care plan section) Acute Rehab PT Goals PT  Goal Formulation: With patient Time For Goal Achievement: 09/09/19 Potential to Achieve Goals: Good Progress towards PT goals: Progressing toward goals    Frequency    7X/week      PT Plan Current plan remains appropriate    Co-evaluation              AM-PAC PT "6 Clicks" Mobility   Outcome Measure  Help needed turning from your back to your side while in a flat bed without using bedrails?: A Little Help needed moving  from lying on your back to sitting on the side of a flat bed without using bedrails?: A Little Help needed moving to and from a bed to a chair (including a wheelchair)?: A Little Help needed standing up from a chair using your arms (e.g., wheelchair or bedside chair)?: A Little Help needed to walk in hospital room?: A Little Help needed climbing 3-5 steps with a railing? : A Little 6 Click Score: 18    End of Session Equipment Utilized During Treatment: Gait belt;Right knee immobilizer Activity Tolerance: Patient tolerated treatment well Patient left: in bed;with call bell/phone within reach;with bed alarm set Nurse Communication: Mobility status PT Visit Diagnosis: Other abnormalities of gait and mobility (R26.89)     Time: [id number] PT Time Calculation (min) (ACUTE ONLY): 20 min  Charges:  $Gait Training: 8-22 mins $Therapeutic Exercise: 23-37 mins                     Debe Coder PT Acute Rehabilitation Services Pager [phone] Office [phone]    Saurabh Hettich 08/30/2019, 1:06 PM

## 2019-09-01 DIAGNOSIS — M25561 Pain in right knee: Secondary | ICD-10-CM | POA: Diagnosis not present

## 2019-09-01 NOTE — Discharge Summary (Signed)
Physician Discharge Summary   Patient ID: LISABELLA PAO MRN: [id number] DOB/AGE: 1950-12-30 69 y.o.  Admit date: 08/25/2019 Discharge date: 08/30/2019  Primary Diagnosis: Osteoarthritis, right knee   Admission Diagnoses:  Past Medical History:  Diagnosis Date  . Anemia   . Arthritis   . Chronic chest pain   . Chronic fatigue 02/03/2017  . Colon polyps    inflamed partially ulcerated hyperplastic polyp  . Diabetes ([place]) 09/24/2013  . Diverticulosis   . Dysphagia esophageal phase - chronic after 3 fundoplications [id number]  . Esophageal stenosis    Stricture after fundoplication REDO  . Fibromyalgia   . GERD (gastroesophageal reflux disease)   . Heart murmur    no problems   . Hemorrhoids, internal, with bleeding 06/27/2017  . History of hiatal hernia   . Hypertension   . IBS (irritable bowel syndrome)   . Mitral valve prolapse   . OSA (obstructive sleep apnea)    on CPAP-[place] - Sleep study 2004  . Pulmonary embolus ([place])    2010 and 12-2013  . Sleep apnea    wears CPAP  . Thyroid nodule      dr. watching   . Tinnitus    Subjective  . [place] 2014   R flank   Discharge Diagnoses:   Principal Problem:   OA (osteoarthritis) of knee Active Problems:   Primary osteoarthritis of right knee   S/P total knee arthroplasty, right  Estimated body mass index is 38.36 kg/m as calculated from the following:   Height as of this encounter: 5' 8.5" (1.74 m).   Weight as of this encounter: 116.1 kg.  Procedure:  Procedure(s) (LRB): RIGHT TOTAL KNEE ARTHROPLASTY (Right)   Consults: None  HPI: Jennifer [name] is a 69 y.o. year old female with end stage OA of her right knee with progressively worsening pain and dysfunction. She has constant pain, with activity and at rest and significant functional deficits with difficulties even with ADLs. She has had extensive non-op management including analgesics, injections of cortisone and viscosupplements, and home exercise program, but remains in  significant pain with significant dysfunction.Radiographs show bone on bone arthritis lateral and patellofemoral. She presents now for right Total Knee Arthroplasty.    Laboratory Data: Admission on 08/25/2019, Discharged on 08/30/2019  Component Date Value Ref Range Status  . Glucose-Capillary 08/25/2019 91  70 - 99 mg/dL Final   Glucose reference range applies only to samples taken after fasting for at least 8 hours.  . Comment 1 08/25/2019 Notify RN   Final  . Comment 2 08/25/2019 Document in Chart   Final  . WBC 08/26/2019 8.1  4.0 - 10.5 K/uL Final  . RBC 08/26/2019 4.30  3.87 - 5.11 MIL/uL Final  . Hemoglobin 08/26/2019 12.2  12.0 - 15.0 g/dL Final  . HCT 08/26/2019 39.3  36.0 - 46.0 % Final  . MCV 08/26/2019 91.4  80.0 - 100.0 fL Final  . MCH 08/26/2019 28.4  26.0 - 34.0 pg Final  . MCHC 08/26/2019 31.0  30.0 - 36.0 g/dL Final  . RDW 08/26/2019 15.2  11.5 - 15.5 % Final  . Platelets 08/26/2019 247  150 - 400 K/uL Final  . nRBC 08/26/2019 0.0  0.0 - 0.2 % Final   Performed at [facility], [place] [address] [place], [place] [address]  . Sodium 08/26/2019 138  135 - 145 mmol/L Final  . Potassium 08/26/2019 4.2  3.5 - 5.1 mmol/L Final  . Chloride 08/26/2019 104  98 -  111 mmol/L Final  . CO2 08/26/2019 23  22 - 32 mmol/L Final  . Glucose, Bld 08/26/2019 157* 70 - 99 mg/dL Final   Glucose reference range applies only to samples taken after fasting for at least 8 hours.  . BUN 08/26/2019 17  8 - 23 mg/dL Final  . Creatinine, Ser 08/26/2019 0.97  0.44 - 1.00 mg/dL Final  . Calcium 08/26/2019 8.1* 8.9 - 10.3 mg/dL Final  . GFR calc non Af Amer 08/26/2019 >60  >60 mL/min Final  . GFR calc Af Amer 08/26/2019 >60  >60 mL/min Final  . Anion gap 08/26/2019 11  5 - 15 Final   Performed at [facility], [place] [address], [place], [place] [address]  . WBC 08/27/2019 13.7* 4.0 - 10.5 K/uL Final  . RBC 08/27/2019 3.78* 3.87 - 5.11 MIL/uL Final  . Hemoglobin  08/27/2019 10.8* 12.0 - 15.0 g/dL Final  . HCT 08/27/2019 34.5* 36.0 - 46.0 % Final  . MCV 08/27/2019 91.3  80.0 - 100.0 fL Final  . MCH 08/27/2019 28.6  26.0 - 34.0 pg Final  . MCHC 08/27/2019 31.3  30.0 - 36.0 g/dL Final  . RDW 08/27/2019 15.5  11.5 - 15.5 % Final  . Platelets 08/27/2019 218  150 - 400 K/uL Final  . nRBC 08/27/2019 0.0  0.0 - 0.2 % Final   Performed at [facility], [place] [address], [place], [place] [address]  . Sodium 08/27/2019 139  135 - 145 mmol/L Final  . Potassium 08/27/2019 4.0  3.5 - 5.1 mmol/L Final  . Chloride 08/27/2019 103  98 - 111 mmol/L Final  . CO2 08/27/2019 30  22 - 32 mmol/L Final  . Glucose, Bld 08/27/2019 141* 70 - 99 mg/dL Final   Glucose reference range applies only to samples taken after fasting for at least 8 hours.  . BUN 08/27/2019 21  8 - 23 mg/dL Final  . Creatinine, Ser 08/27/2019 0.86  0.44 - 1.00 mg/dL Final  . Calcium 08/27/2019 8.2* 8.9 - 10.3 mg/dL Final  . GFR calc non Af Amer 08/27/2019 >60  >60 mL/min Final  . GFR calc Af Amer 08/27/2019 >60  >60 mL/min Final  . Anion gap 08/27/2019 6  5 - 15 Final   Performed at [facility], [place] [address] [place], [place] [address]  . WBC 08/28/2019 9.8  4.0 - 10.5 K/uL Final  . RBC 08/28/2019 3.62* 3.87 - 5.11 MIL/uL Final  . Hemoglobin 08/28/2019 10.4* 12.0 - 15.0 g/dL Final  . HCT 08/28/2019 33.3* 36.0 - 46.0 % Final  . MCV 08/28/2019 92.0  80.0 - 100.0 fL Final  . MCH 08/28/2019 28.7  26.0 - 34.0 pg Final  . MCHC 08/28/2019 31.2  30.0 - 36.0 g/dL Final  . RDW 08/28/2019 15.8* 11.5 - 15.5 % Final  . Platelets 08/28/2019 213  150 - 400 K/uL Final  . nRBC 08/28/2019 0.0  0.0 - 0.2 % Final   Performed at [facility], [place] [address] [place], [place] [address]  Hospital Outpatient Visit on 08/21/2019  Component Date Value Ref Range Status  . SARS Coronavirus 2 08/21/2019 NEGATIVE  NEGATIVE Final   Comment: (NOTE) SARS-CoV-2 target  nucleic acids are NOT DETECTED. The SARS-CoV-2 RNA is generally detectable in upper and lower respiratory specimens during the acute phase of infection. Negative results do not preclude SARS-CoV-2 infection, do not rule out co-infections with other pathogens, and should not be used as the  sole basis for treatment or other patient management decisions. Negative results must be combined with clinical observations, patient history, and epidemiological information. The expected result is Negative. Fact Sheet for Patients: SugarRoll.be Fact Sheet for Healthcare Providers: https://www.woods-mathews.com/ This test is not yet approved or cleared by the [place] FDA and  has been authorized for detection and/or diagnosis of SARS-CoV-2 by FDA under an Emergency Use Authorization (EUA). This EUA will remain  in effect (meaning this test can be used) for the duration of the COVID-19 declaration under Section 56                          4(b)(1) of the Act, 21 U.S.C. section 360bbb-3(b)(1), unless the authorization is terminated or revoked sooner. Performed at [facility], [place] [address] [place], [place] [address]   Hospital Outpatient Visit on 08/15/2019  Component Date Value Ref Range Status  . aPTT 08/15/2019 35  24 - 36 seconds Final   Performed at [facility], [place] [address] [place], [place] [address]  . WBC 08/15/2019 3.9* 4.0 - 10.5 K/uL Final  . RBC 08/15/2019 4.56  3.87 - 5.11 MIL/uL Final  . Hemoglobin 08/15/2019 12.9  12.0 - 15.0 g/dL Final  . HCT 08/15/2019 40.5  36.0 - 46.0 % Final  . MCV 08/15/2019 88.8  80.0 - 100.0 fL Final  . MCH 08/15/2019 28.3  26.0 - 34.0 pg Final  . MCHC 08/15/2019 31.9  30.0 - 36.0 g/dL Final  . RDW 08/15/2019 15.2  11.5 - 15.5 % Final  . Platelets 08/15/2019 232  150 - 400 K/uL Final  . nRBC 08/15/2019 0.0  0.0 - 0.2 % Final   Performed at [facility], [place]  [address] [place],  [address]  . Sodium 08/15/2019 141  135 - 145 mmol/L Final  . Potassium 08/15/2019 3.8  3.5 - 5.1 mmol/L Final  . Chloride 08/15/2019 105  98 - 111 mmol/L Final  . CO2 08/15/2019 28  22 - 32 mmol/L Final  . Glucose, Bld 08/15/2019 94  70 - 99 mg/dL Final   Glucose reference range applies only to samples taken after fasting for at least 8 hours.  . BUN 08/15/2019 18  8 - 23 mg/dL Final  . Creatinine, Ser 08/15/2019 0.79  0.44 - 1.00 mg/dL Final  . Calcium 08/15/2019 8.8* 8.9 - 10.3 mg/dL Final  . Total Protein 08/15/2019 7.2  6.5 - 8.1 g/dL Final  . Albumin 08/15/2019 4.1  3.5 - 5.0 g/dL Final  . AST 08/15/2019 25  15 - 41 U/L Final  . ALT 08/15/2019 22  0 - 44 U/L Final  . Alkaline Phosphatase 08/15/2019 57  38 - 126 U/L Final  . Total Bilirubin 08/15/2019 0.5  0.3 - 1.2 mg/dL Final  . GFR calc non Af Amer 08/15/2019 >60  >60 mL/min Final  . GFR calc Af Amer 08/15/2019 >60  >60 mL/min Final  . Anion gap 08/15/2019 8  5 - 15 Final   Performed at [facility], [place] [address], [place],  [address]  . Prothrombin Time 08/15/2019 12.4  11.4 - 15.2 seconds Final  . INR 08/15/2019 0.9  0.8 - 1.2 Final   Comment: (NOTE) INR goal varies based on device and disease states. Performed at [facility], [place] [address] [place],  [address]   . ABO/RH(D) 08/15/2019 AB POS   Final  . Antibody Screen  08/15/2019 NEG   Final  . Sample Expiration 08/15/2019 08/28/2019,2359   Final  . Extend sample reason 08/15/2019    Final                   Value:NO TRANSFUSIONS OR PREGNANCY IN THE PAST 3 MONTHS Performed at [facility], [place] [address], [place], [place] [address]   . MRSA, PCR 08/15/2019 NEGATIVE  NEGATIVE Final  . Staphylococcus aureus 08/15/2019 NEGATIVE  NEGATIVE Final   Comment: (NOTE) The Xpert SA Assay (FDA approved for NASAL specimens in patients 67 years of age and older), is one component of a  comprehensive surveillance program. It is not intended to diagnose infection nor to guide or monitor treatment. Performed at [facility], [place] [address] [place], [place] [address]   . Hgb A1c MFr Bld 08/15/2019 5.9* 4.8 - 5.6 % Final   Comment: (NOTE) Pre diabetes:          5.7%-6.4% Diabetes:              >6.4% Glycemic control for   <7.0% adults with diabetes   . Mean Plasma Glucose 08/15/2019 122.63  mg/dL Final   Performed at [place] [address], [place], [place] [address]  . Glucose-Capillary 08/15/2019 89  70 - 99 mg/dL Final   Glucose reference range applies only to samples taken after fasting for at least 8 hours.  . ABO/RH(D) 08/15/2019    Final                   Value:AB POS Performed at [facility], [place] [address], [place], [place] [address]   Hospital Outpatient Visit on 08/02/2019  Component Date Value Ref Range Status  . SARS Coronavirus 2 08/02/2019 NEGATIVE  NEGATIVE Final   Comment: (NOTE) SARS-CoV-2 target nucleic acids are NOT DETECTED. The SARS-CoV-2 RNA is generally detectable in upper and lower respiratory specimens during the acute phase of infection. Negative results do not preclude SARS-CoV-2 infection, do not rule out co-infections with other pathogens, and should not be used as the sole basis for treatment or other patient management decisions. Negative results must be combined with clinical observations, patient history, and epidemiological information. The expected result is Negative. Fact Sheet for Patients: SugarRoll.be Fact Sheet for Healthcare Providers: https://www.woods-mathews.com/ This test is not yet approved or cleared by the [place] FDA and  has been authorized for detection and/or diagnosis of SARS-CoV-2 by FDA under an Emergency Use Authorization (EUA). This EUA will remain  in effect (meaning this test can be used) for the duration  of the COVID-19 declaration under Section 56                          4(b)(1) of the Act, 21 U.S.C. section 360bbb-3(b)(1), unless the authorization is terminated or revoked sooner. Performed at [facility], [place] [address], [place], [place] [address]      X-Rays:No results found.  EKG: Orders placed or performed during the hospital encounter of 08/15/19  . EKG 12-Lead  . EKG 12-Lead     Hospital Course: CADANCE SCHUMAN is a 69 y.o. who was admitted to [facility] They were brought to the operating room on 08/25/2019 and underwent Procedure(s): RIGHT TOTAL KNEE ARTHROPLASTY.  Patient tolerated the procedure well and was later transferred to the recovery room and then to the orthopaedic floor for postoperative care. They were  given PO and IV analgesics for pain control following their surgery. They were given 24 hours of postoperative antibiotics of  Anti-infectives (From admission, onward)   Start     Dose/Rate Route Frequency Ordered Stop   08/25/19 1900  ceFAZolin (ANCEF) IVPB 2g/100 mL premix     2 g 200 mL/hr over 30 Minutes Intravenous Every 6 hours 08/25/19 1634 08/26/19 0213   08/25/19 1030  ceFAZolin (ANCEF) IVPB 2g/100 mL premix     2 g 200 mL/hr over 30 Minutes Intravenous On call to O.R. 08/25/19 1019 08/25/19 1309     and started on DVT prophylaxis in the form of Xarelto.   PT and OT were ordered for total joint protocol. Discharge planning consulted to help with postop disposition and equipment needs. Patient had a good night on the evening of surgery. They started to get up OOB with therapy on POD #1. Hemovac drain was pulled without difficulty on day one. Continued to work with therapy into POD #2. Patient had slower progression with therapy and was not cleared for safe discharge until POD #5. Incision was healing well, dressing was clean, dry and intact without drainage. On POD #5, patient was cleared by physical therapy and was ready to discharge home in  stable condition.   Diet: Diabetic diet Activity: WBAT Follow-up: in 2 weeks Disposition: Home with outpatient physical therapy Discharged Condition: stable   Discharge Instructions    Call MD / Call 911   Complete by: As directed    If you experience chest pain or shortness of breath, CALL 911 and be transported to the hospital emergency room.  If you develope a fever above 101 F, pus (white drainage) or increased drainage or redness at the wound, or calf pain, call your surgeon's office.   Call MD / Call 911   Complete by: As directed    If you experience chest pain or shortness of breath, CALL 911 and be transported to the hospital emergency room.  If you develope a fever above 101 F, pus (white drainage) or increased drainage or redness at the wound, or calf pain, call your surgeon's office.   Change dressing   Complete by: As directed    You may remove the bulky bandage (ACE wrap and gauze) two days after surgery. You will have an adhesive waterproof bandage underneath. Leave this in place until your first follow-up appointment.   Constipation Prevention   Complete by: As directed    Drink plenty of fluids.  Prune juice may be helpful.  You may use a stool softener, such as Colace (over the counter) 100 mg twice a day.  Use MiraLax (over the counter) for constipation as needed.   Constipation Prevention   Complete by: As directed    Drink plenty of fluids.  Prune juice may be helpful.  You may use a stool softener, such as Colace (over the counter) 100 mg twice a day.  Use MiraLax (over the counter) for constipation as needed.   Diet - low sodium heart healthy   Complete by: As directed    Diet - low sodium heart healthy   Complete by: As directed    Do not put a pillow under the knee. Place it under the heel.   Complete by: As directed    Driving restrictions   Complete by: As directed    No driving for two weeks   Increase activity slowly as tolerated   Complete by: As  directed  TED hose   Complete by: As directed    Use stockings (TED hose) for three weeks on both leg(s).  You may remove them at night for sleeping.   Weight bearing as tolerated   Complete by: As directed      Allergies as of 08/30/2019      Reactions   Morphine And Related Anaphylaxis, Shortness Of Breath   Sulfonamide Derivatives Hives   Burn from inside out   Promethazine Hcl Other (See Comments)   Hallucinations      Medication List    STOP taking these medications   AMBULATORY NON FORMULARY MEDICATION     TAKE these medications   acetaminophen 500 MG tablet Commonly known as: TYLENOL Take 1 tablet (500 mg total) by mouth every 6 (six) hours as needed. What changed: reasons to take this   Aciphex 20 MG tablet Generic drug: RABEprazole Take 1 tablet by mouth twice daily What changed:   how much to take  when to take this   ALPRAZolam 0.5 MG tablet Commonly known as: XANAX Take 0.5 mg by mouth at bedtime as needed for anxiety.   cycloSPORINE 0.05 % ophthalmic emulsion Commonly known as: RESTASIS Place 1 drop into both eyes 2 (two) times daily.   escitalopram 10 MG tablet Commonly known as: LEXAPRO Take 1 tablet (10 mg total) by mouth daily.   gabapentin 300 MG capsule Commonly known as: NEURONTIN Take a 300 mg capsule three times a day for two weeks following surgery.Then take a 300 mg capsule two times a day for two weeks. Then take a 300 mg capsule once a day for two weeks. Then discontinue.   glucose blood test strip Check blood sugar once daily   hydrocortisone valerate cream 0.2 % Commonly known as: WESTCORT Apply 1 application topically daily as needed (eczema). eczema   HYDROmorphone 2 MG tablet Commonly known as: Dilaudid Take 1-2 tablets (2-4 mg total) by mouth every 6 (six) hours as needed for up to 7 days for severe pain.   hyoscyamine 0.125 MG SL tablet Commonly known as: LEVSIN SL DISSOLVE ONE TABLET IN MOUTH EVERY 4 HOURS AS  NEEDED What changed:   how much to take  how to take this  when to take this  reasons to take this  additional instructions   methocarbamol 500 MG tablet Commonly known as: ROBAXIN Take 1 tablet (500 mg total) by mouth every 6 (six) hours as needed for muscle spasms.   multivitamin with minerals tablet Take 1 tablet by mouth daily. Rainbow light   ondansetron 4 MG tablet Commonly known as: Zofran Take 1 tablet (4 mg total) by mouth every 8 (eight) hours as needed for nausea or vomiting.   OneTouch Delica Lancets [id number] Misc Check blood sugar once daily   oxyCODONE 5 MG immediate release tablet Commonly known as: Oxy IR/ROXICODONE Take 1-2 tablets (5-10 mg total) by mouth every 6 (six) hours as needed for severe pain. Not to exceed 6 tablets a day.   Prevagen 10 MG Caps Generic drug: Apoaequorin Take 10 mg by mouth daily. prevagen   rivaroxaban 10 MG Tabs tablet Commonly known as: XARELTO Take 1 tablet (10 mg total) by mouth daily with breakfast for 20 days. Then resume 5 mg Xarelto once a day. What changed:   medication strength  how much to take  when to take this  additional instructions   temazepam 30 MG capsule Commonly known as: RESTORIL TAKE 1 CAPSULE BY MOUTH AT BEDTIME AS  NEEDED FOR SLEEP What changed: See the new instructions.   traMADol 50 MG tablet Commonly known as: ULTRAM Take 1-2 tablets (50-100 mg total) by mouth every 6 (six) hours as needed for moderate pain.   triamterene-hydrochlorothiazide 37.5-25 MG tablet Commonly known as: MAXZIDE-25 Take 0.5 tablets by mouth daily.            Discharge Care Instructions  (From admission, onward)         Start     Ordered   08/26/19 0000  Weight bearing as tolerated     08/26/19 0737   08/26/19 0000  Change dressing    Comments: You may remove the bulky bandage (ACE wrap and gauze) two days after surgery. You will have an adhesive waterproof bandage underneath. Leave this in place until  your first follow-up appointment.   08/26/19 0737         Follow-up Information    Gaynelle Arabian, MD. Schedule an appointment as soon as possible for a visit on 09/09/2019.   Specialty: Orthopedic Surgery Contact information: [address] [place] [address] [id number]        Rosilyn Mings.. Go on 08/28/2019.   Why: You are scheduled for a physical therapy appointment on 08-28-19 at 11:00 am. Contact information: [place] [address] [id number]           Signed: Theresa Duty, PA-C Orthopedic Surgery 09/01/2019, 7:24 AM

## 2019-09-04 DIAGNOSIS — M25561 Pain in right knee: Secondary | ICD-10-CM | POA: Diagnosis not present

## 2019-09-08 DIAGNOSIS — M25561 Pain in right knee: Secondary | ICD-10-CM | POA: Diagnosis not present

## 2019-09-10 DIAGNOSIS — M25561 Pain in right knee: Secondary | ICD-10-CM | POA: Diagnosis not present

## 2019-09-12 DIAGNOSIS — M25561 Pain in right knee: Secondary | ICD-10-CM | POA: Diagnosis not present

## 2019-09-15 DIAGNOSIS — M25561 Pain in right knee: Secondary | ICD-10-CM | POA: Diagnosis not present

## 2019-09-17 ENCOUNTER — Ambulatory Visit: Payer: Medicare Other | Admitting: Hematology & Oncology

## 2019-09-17 ENCOUNTER — Other Ambulatory Visit: Payer: Medicare Other

## 2019-09-17 DIAGNOSIS — M25561 Pain in right knee: Secondary | ICD-10-CM | POA: Diagnosis not present

## 2019-09-19 DIAGNOSIS — M25561 Pain in right knee: Secondary | ICD-10-CM | POA: Diagnosis not present

## 2019-09-23 DIAGNOSIS — M25561 Pain in right knee: Secondary | ICD-10-CM | POA: Diagnosis not present

## 2019-09-26 DIAGNOSIS — M25561 Pain in right knee: Secondary | ICD-10-CM | POA: Diagnosis not present

## 2019-09-29 DIAGNOSIS — M25561 Pain in right knee: Secondary | ICD-10-CM | POA: Diagnosis not present

## 2019-09-30 DIAGNOSIS — Z96651 Presence of right artificial knee joint: Secondary | ICD-10-CM | POA: Diagnosis not present

## 2019-09-30 DIAGNOSIS — Z471 Aftercare following joint replacement surgery: Secondary | ICD-10-CM | POA: Diagnosis not present

## 2019-10-01 DIAGNOSIS — M25561 Pain in right knee: Secondary | ICD-10-CM | POA: Diagnosis not present

## 2019-10-03 DIAGNOSIS — M25561 Pain in right knee: Secondary | ICD-10-CM | POA: Diagnosis not present

## 2019-10-06 DIAGNOSIS — M25561 Pain in right knee: Secondary | ICD-10-CM | POA: Diagnosis not present

## 2019-10-08 DIAGNOSIS — M25561 Pain in right knee: Secondary | ICD-10-CM | POA: Diagnosis not present

## 2019-10-25 ENCOUNTER — Other Ambulatory Visit: Payer: Self-pay | Admitting: Hematology & Oncology

## 2019-10-25 ENCOUNTER — Other Ambulatory Visit: Payer: Self-pay | Admitting: Internal Medicine

## 2019-10-29 DIAGNOSIS — M25561 Pain in right knee: Secondary | ICD-10-CM | POA: Diagnosis not present

## 2019-11-03 ENCOUNTER — Ambulatory Visit: Payer: Medicare Other | Admitting: Gastroenterology

## 2019-11-03 NOTE — Progress Notes (Deleted)
Jennifer [name]    [id number]    Jun 01, 1950  Primary Care Physician:Paz, Alda Berthold, MD  Referring Physician: Colon Branch, MD [address] [place],   [id number]   Chief complaint:  ***  HPI:  *** Esophageal manometry April 2021: EG junction outflow obstruction with intact peristalsis, not consistent with achalasia  Barium esophagram March 2021: Intact Nissen fundoplication, no hiatal hernia no gastroesophageal reflux.  Esophageal motility within normal limits.  Abrupt caliber transition in lower thoracic esophagus near the EG junction with smooth margins, moderate to high-grade luminal narrowing at which barium tablet became lodged  EGD September 28, 2016 by [name]: Evidence of Nissen fundoplication, esophagus dilated with TTS balloon 18 to 20 mm.  Otherwise unremarkable exam.  Colonoscopy December 14, 2015: 3 small sessile polyps, one SSA, 1 TA and one lymphoid aggregate, colonic diverticulosis otherwise unremarkable exam.  Recommended recall colonoscopy in 5 years, due August 2022   Outpatient Encounter Medications as of 11/03/2019  Medication Sig  . acetaminophen (TYLENOL) 500 MG tablet Take 1 tablet (500 mg total) by mouth every 6 (six) hours as needed. (Patient taking differently: Take 500 mg by mouth every 6 (six) hours as needed for mild pain or fever. )  . ACIPHEX 20 MG tablet Take 1 tablet by mouth twice daily  . ALPRAZolam (XANAX) 0.5 MG tablet Take 0.5 mg by mouth at bedtime as needed for anxiety.  Marland Kitchen Apoaequorin (PREVAGEN) 10 MG CAPS Take 10 mg by mouth daily. prevagen  . cycloSPORINE (RESTASIS) 0.05 % ophthalmic emulsion Place 1 drop into both eyes 2 (two) times daily.   Marland Kitchen escitalopram (LEXAPRO) 10 MG tablet Take 1 tablet (10 mg total) by mouth daily.  Marland Kitchen gabapentin (NEURONTIN) 300 MG capsule Take a 300 mg capsule three times a day for two weeks following surgery.Then take a 300 mg capsule two times a day for two weeks. Then take a 300 mg capsule  once a day for two weeks. Then discontinue.  Marland Kitchen glucose blood test strip Check blood sugar once daily  . hydrocortisone valerate cream (WESTCORT) 0.2 % Apply 1 application topically daily as needed (eczema). eczema  . hyoscyamine (LEVSIN SL) 0.125 MG SL tablet DISSOLVE ONE TABLET IN MOUTH EVERY 4 HOURS AS NEEDED (Patient taking differently: Take 0.125 mg by mouth every 4 (four) hours as needed for cramping. )  . methocarbamol (ROBAXIN) 500 MG tablet Take 1 tablet (500 mg total) by mouth every 6 (six) hours as needed for muscle spasms.  . Multiple Vitamins-Minerals (MULTIVITAMIN WITH MINERALS) tablet Take 1 tablet by mouth daily. Rainbow light  . ondansetron (ZOFRAN) 4 MG tablet Take 1 tablet (4 mg total) by mouth every 8 (eight) hours as needed for nausea or vomiting.  Glory Rosebush DELICA LANCETS 17E MISC Check blood sugar once daily  . oxyCODONE (OXY IR/ROXICODONE) 5 MG immediate release tablet Take 1-2 tablets (5-10 mg total) by mouth every 6 (six) hours as needed for severe pain. Not to exceed 6 tablets a day.  . rivaroxaban (XARELTO) 10 MG TABS tablet Take 1 tablet (10 mg total) by mouth daily with breakfast for 20 days. Then resume 5 mg Xarelto once a day.  . temazepam (RESTORIL) 30 MG capsule TAKE 1 CAPSULE BY MOUTH AT BEDTIME AS NEEDED FOR SLEEP (Patient taking differently: Take 30 mg by mouth at bedtime. )  . traMADol (ULTRAM) 50 MG tablet Take 1-2 tablets (50-100 mg total)  by mouth every 6 (six) hours as needed for moderate pain.  Marland Kitchen triamterene-hydrochlorothiazide (MAXZIDE-25) 37.5-25 MG tablet Take 0.5 tablets by mouth daily.  Alveda Reasons 2.5 MG TABS tablet Take 2 tablets by mouth once daily   No facility-administered encounter medications on file as of 11/03/2019.    Allergies as of 11/03/2019 - Review Complete 08/25/2019  Allergen Reaction Noted  . Morphine and related Anaphylaxis and Shortness Of Breath 12/01/2013  . Sulfonamide derivatives Hives 12/02/2018  . Promethazine hcl Other (See  Comments) 12/02/2018    Past Medical History:  Diagnosis Date  . Anemia   . Arthritis   . Chronic chest pain   . Chronic fatigue 02/03/2017  . Colon polyps    inflamed partially ulcerated hyperplastic polyp  . Diabetes ([place]) 09/24/2013  . Diverticulosis   . Dysphagia esophageal phase - chronic after 3 fundoplications 09/18/7822  . Esophageal stenosis    Stricture after fundoplication REDO  . Fibromyalgia   . GERD (gastroesophageal reflux disease)   . Heart murmur    no problems   . Hemorrhoids, internal, with bleeding 06/27/2017  . History of hiatal hernia   . Hypertension   . IBS (irritable bowel syndrome)   . Mitral valve prolapse   . OSA (obstructive sleep apnea)    on CPAP-Mild - Sleep study 2004  . Pulmonary embolus ([place])    2010 and 12-2013  . Sleep apnea    wears CPAP  . Thyroid nodule      dr. watching   . Tinnitus    Subjective  . Zoster 2014   R flank    Past Surgical History:  Procedure Laterality Date  . ABDOMINAL HYSTERECTOMY     still has 1 ovary   . APPENDECTOMY    . [place]   Stem surgery, Arnold Chiari Malformation  . BREAST REDUCTION SURGERY    . CATARACT EXTRACTION Bilateral 2017   cataracts, Dr. Herbert Deaner  . ESOPHAGEAL MANOMETRY N/A 08/06/2019   Procedure: ESOPHAGEAL MANOMETRY (EM);  Surgeon: Gatha Mayer, MD;  Location: [facility];  Service: Endoscopy;  Laterality: N/A;  . HEMORRHOID BANDING    . HERNIA REPAIR     Hital Hernia  . KNEE ARTHROSCOPY  05/16/2012   Procedure: ARTHROSCOPY KNEE;  Surgeon: Sharmon Revere, MD;  Location: [facility];  Service: Orthopedics;  Laterality: Left;  . KNEE ARTHROSCOPY Right 01/2018  . NISSEN FUNDOPLICATION     X 3 lab, open x 2 last with mesh  . REDUCTION MAMMAPLASTY Bilateral 2007  . TOTAL KNEE ARTHROPLASTY Right 08/25/2019   Procedure: RIGHT TOTAL KNEE ARTHROPLASTY;  Surgeon: Gaynelle Arabian, MD;  Location: [facility];  Service: Orthopedics;  Laterality: Right;  61min    Family History  Problem  Relation Age of Onset  . Diabetes Mother   . Rheum arthritis Mother   . Hypertension Mother   . Hypertension Father   . Rheum arthritis Sister   . Hypertension Sister   . Colon cancer Maternal Grandmother   . Rheum arthritis Sister   . Rheum arthritis Sister   . Rheum arthritis Sister   . CAD Sister        MIdx age 39  . Stomach cancer Maternal Uncle   . Breast cancer Neg Hx   . Esophageal cancer Neg Hx   . Rectal cancer Neg Hx     Social History   Socioeconomic History  . Marital status: Married    Spouse name: Not on file  .  Number of children: 1  . Years of education: Not on file  . Highest education level: Not on file  Occupational History  . Occupation: retired form the [employer] 04-2015    Employer: [employer]  Tobacco Use  . Smoking status: Never Smoker  . Smokeless tobacco: Never Used  . Tobacco comment: never used tobacco  Vaping Use  . Vaping Use: Never used  Substance and Sexual Activity  . Alcohol use: No    Alcohol/week: 0.0 standard drinks  . Drug use: No  . Sexual activity: Not Currently    Birth control/protection: None  Other Topics Concern  . Not on file  Social History Narrative   [employer] Graduate - Technical school   Married 2068/07/24 for 2 years, divorced; re-married 07-24-84 for 2.5 years, divorced; re-married 07/24/1988 for 1 year, divorced; re-married '00   1 son - 25-Jul-2070   Sister - Died @ 52 [place]       Social Determinants of Radio broadcast assistant Strain:   . Difficulty of Paying Living Expenses:   Food Insecurity:   . Worried About Charity fundraiser in the Last Year:   . Arboriculturist in the Last Year:   Transportation Needs:   . Film/video editor (Medical):   Marland Kitchen Lack of Transportation (Non-Medical):   Physical Activity:   . Days of Exercise per Week:   . Minutes of Exercise per Session:   Stress:   . Feeling of Stress :   Social Connections:   . Frequency of Communication with Friends and Family:   . Frequency of Social  Gatherings with Friends and Family:   . Attends Religious Services:   . Active Member of Clubs or Organizations:   . Attends Archivist Meetings:   Marland Kitchen Marital Status:   Intimate Partner Violence:   . Fear of Current or Ex-Partner:   . Emotionally Abused:   Marland Kitchen Physically Abused:   . Sexually Abused:       Review of systems: Positive for *** All other review of systems negative except as mentioned in the HPI.   Physical Exam: There were no vitals filed for this visit. There is no height or weight on file to calculate BMI. Gen:      No acute distress HEENT:  EOMI, sclera anicteric Neck:     No masses; no thyromegaly Lungs:    Clear to auscultation bilaterally; normal respiratory effort CV:         Regular rate and rhythm; no murmurs Abd:      + bowel sounds; soft, non-tender; no palpable masses, no distension Ext:    No edema; adequate peripheral perfusion Skin:      Warm and dry; no rash Neuro: alert and oriented x 3 Psych: normal mood and affect  Data Reviewed:  Reviewed labs, radiology imaging, old records and pertinent past GI work up   Assessment and Plan/Recommendations:  ***  This visit required *** minutes of patient care (this includes precharting, chart review, review of results, face-to-face time used for counseling as well as treatment plan and follow-up. The patient was provided an opportunity to ask questions and all were answered. The patient agreed with the plan and demonstrated an understanding of the instructions.  Damaris Hippo , MD    CC: Colon Branch, MD

## 2019-11-06 ENCOUNTER — Ambulatory Visit: Payer: Medicare Other

## 2019-11-17 ENCOUNTER — Ambulatory Visit (INDEPENDENT_AMBULATORY_CARE_PROVIDER_SITE_OTHER): Payer: Medicare Other | Admitting: Internal Medicine

## 2019-11-17 ENCOUNTER — Other Ambulatory Visit: Payer: Self-pay

## 2019-11-17 ENCOUNTER — Other Ambulatory Visit: Payer: Self-pay | Admitting: Internal Medicine

## 2019-11-17 ENCOUNTER — Encounter: Payer: Self-pay | Admitting: Internal Medicine

## 2019-11-17 VITALS — BP 101/68 | HR 82 | Temp 98.2°F | Resp 16 | Ht 69.0 in | Wt 233.0 lb

## 2019-11-17 DIAGNOSIS — E119 Type 2 diabetes mellitus without complications: Secondary | ICD-10-CM

## 2019-11-17 DIAGNOSIS — G4710 Hypersomnia, unspecified: Secondary | ICD-10-CM

## 2019-11-17 DIAGNOSIS — M1710 Unilateral primary osteoarthritis, unspecified knee: Secondary | ICD-10-CM

## 2019-11-17 DIAGNOSIS — Z79899 Other long term (current) drug therapy: Secondary | ICD-10-CM

## 2019-11-17 DIAGNOSIS — I10 Essential (primary) hypertension: Secondary | ICD-10-CM

## 2019-11-17 DIAGNOSIS — F419 Anxiety disorder, unspecified: Secondary | ICD-10-CM

## 2019-11-17 DIAGNOSIS — G4730 Sleep apnea, unspecified: Secondary | ICD-10-CM | POA: Diagnosis not present

## 2019-11-17 DIAGNOSIS — M179 Osteoarthritis of knee, unspecified: Secondary | ICD-10-CM

## 2019-11-17 NOTE — Telephone Encounter (Signed)
Please arrange a ROV ==== Rx sent, PDMP ok, getting pain meds from ortho

## 2019-11-17 NOTE — Telephone Encounter (Signed)
Appt scheduled later today.

## 2019-11-17 NOTE — Patient Instructions (Signed)
  GO TO THE LAB : Get the blood work     [place], [place] Come back for a physical exam by 04-2020

## 2019-11-17 NOTE — Assessment & Plan Note (Signed)
DM: Diet controlled, check A1c HTN: Seems well controlled on Maxide.  Last BMP satisfactory except for low calcium, check a ionizec Ca++. DJD: S/p R TKR May 2021, still having a great deal of pain, has been prescribed tramadol and oxycodone by Ortho. Insomnia, on Restoril, check a UDS. GERD, esophageal stenosis: Saw GI, s/p esophageal barium study f/u by a  esophageal motility study April 2021: Evidence of EG junction outflow obstruction  not consistent with achalasia. See reports  Hemorrhoids: Status post banding around 09/2017, still has occasional bleeding, less than before, recommend to contact GI if symptoms persist or severe. RTC 04-2020 CPX.

## 2019-11-17 NOTE — Telephone Encounter (Signed)
Temazepam refill.   Last OV: 05/19/2019 Last Fill: 05/21/2019 #30 and 3RF Pt sig: 1 capsule qhs prn UDS: 01/06/2019 Low risk

## 2019-11-17 NOTE — Progress Notes (Signed)
Subjective:    Patient ID: Jennifer [name], female    DOB: Oct 19, 1950, 69 y.o.   MRN: [id number]  DOS:  11/17/2019 Type of visit - description: f/u Since the last office visit, had a right knee replacement, still having a great deal of pain. Labs reviewed, calcium is slightly low. History of hemorrhoids, she continue with on and off bleeding although bleeding he is less noticeable compared to before the banding procedure.  Review of Systems See above   Past Medical History:  Diagnosis Date   Anemia    Arthritis    Chronic chest pain    Chronic fatigue 02/03/2017   Colon polyps    inflamed partially ulcerated hyperplastic polyp   Diabetes ([place]) 09/24/2013   Diverticulosis    Dysphagia esophageal phase - chronic after 3 fundoplications 10/21/5282   Esophageal stenosis    Stricture after fundoplication REDO   Fibromyalgia    GERD (gastroesophageal reflux disease)    Heart murmur    no problems    Hemorrhoids, internal, with bleeding 06/27/2017   History of hiatal hernia    Hypertension    IBS (irritable bowel syndrome)    Mitral valve prolapse    OSA (obstructive sleep apnea)    on CPAP-Mild - Sleep study 2004   Pulmonary embolus ([place])    2010 and 12-2013   Sleep apnea    wears CPAP   Thyroid nodule      dr. watching    Tinnitus    Subjective   Zoster 2014   R flank    Past Surgical History:  Procedure Laterality Date   ABDOMINAL HYSTERECTOMY     still has 1 ovary    [place]   Stem surgery, Arnold Chiari Malformation   BREAST REDUCTION SURGERY     CATARACT EXTRACTION Bilateral 2017   cataracts, Dr. Herbert Deaner   ESOPHAGEAL MANOMETRY N/A 08/06/2019   Procedure: ESOPHAGEAL MANOMETRY (EM);  Surgeon: Gatha Mayer, MD;  [facility];  Service: Endoscopy;  Laterality: N/A;   HEMORRHOID BANDING     HERNIA REPAIR     Hital Hernia   KNEE ARTHROSCOPY  05/16/2012   Procedure: ARTHROSCOPY KNEE;  Surgeon:  Sharmon Revere, MD;  Location: [place] [facility] [place];  Service: Orthopedics;  Laterality: Left;   KNEE ARTHROSCOPY Right 13/2440   NISSEN FUNDOPLICATION     X 3 lab, open x 2 last with mesh   REDUCTION MAMMAPLASTY Bilateral 2007   TOTAL KNEE ARTHROPLASTY Right 08/25/2019   Procedure: RIGHT TOTAL KNEE ARTHROPLASTY;  Surgeon: Gaynelle Arabian, MD;  [facility];  Service: Orthopedics;  Laterality: Right;  17min    Allergies as of 11/17/2019      Reactions   Morphine And Related Anaphylaxis, Shortness Of Breath   Sulfonamide Derivatives Hives   Burn from inside out   Promethazine Hcl Other (See Comments)   Hallucinations      Medication List       Accurate as of November 17, 2019  9:33 PM. If you have any questions, ask your nurse or doctor.        STOP taking these medications   gabapentin 300 MG capsule Commonly known as: NEURONTIN Stopped by: Kathlene November, MD   methocarbamol 500 MG tablet Commonly known as: ROBAXIN Stopped by: Kathlene November, MD   ondansetron 4 MG tablet Commonly known as: Zofran Stopped by: Kathlene November, MD     TAKE these medications   acetaminophen 500  MG tablet Commonly known as: TYLENOL Take 1 tablet (500 mg total) by mouth every 6 (six) hours as needed. What changed: reasons to take this   Aciphex 20 MG tablet Generic drug: RABEprazole Take 1 tablet by mouth twice daily   ALPRAZolam 0.5 MG tablet Commonly known as: XANAX Take 0.5 mg by mouth at bedtime as needed for anxiety.   cycloSPORINE 0.05 % ophthalmic emulsion Commonly known as: RESTASIS Place 1 drop into both eyes 2 (two) times daily.   escitalopram 10 MG tablet Commonly known as: LEXAPRO Take 1 tablet (10 mg total) by mouth daily.   glucose blood test strip Check blood sugar once daily   hydrocortisone valerate cream 0.2 % Commonly known as: WESTCORT Apply 1 application topically daily as needed (eczema). eczema   hyoscyamine 0.125 MG SL tablet Commonly known as: LEVSIN SL DISSOLVE ONE TABLET IN  MOUTH EVERY 4 HOURS AS NEEDED What changed:   how much to take  how to take this  when to take this  reasons to take this  additional instructions   multivitamin with minerals tablet Take 1 tablet by mouth daily. Rainbow light   OneTouch Delica Lancets 71I Misc Check blood sugar once daily   oxyCODONE 5 MG immediate release tablet Commonly known as: Oxy IR/ROXICODONE Take 1-2 tablets (5-10 mg total) by mouth every 6 (six) hours as needed for severe pain. Not to exceed 6 tablets a day.   Prevagen 10 MG Caps Generic drug: Apoaequorin Take 10 mg by mouth daily. prevagen   temazepam 30 MG capsule Commonly known as: RESTORIL TAKE 1 CAPSULE BY MOUTH AT BEDTIME AS NEEDED FOR SLEEP What changed: See the new instructions.   traMADol 50 MG tablet Commonly known as: ULTRAM Take 1-2 tablets (50-100 mg total) by mouth every 6 (six) hours as needed for moderate pain.   triamterene-hydrochlorothiazide 37.5-25 MG tablet Commonly known as: MAXZIDE-25 Take 0.5 tablets by mouth daily.   Xarelto 2.5 MG Tabs tablet Generic drug: rivaroxaban Take 2 tablets by mouth once daily What changed: Another medication with the same name was removed. Continue taking this medication, and follow the directions you see here. Changed by: Kathlene November, MD          Objective:   Physical Exam BP 101/68 (BP Location: Left Arm, Patient Position: Sitting, Cuff Size: Normal)    Pulse 82    Temp 98.2 F (36.8 C) (Oral)    Resp 16    Ht 5\' 9"  (1.753 m)    Wt (!) 233 lb (105.7 kg)    SpO2 94%    BMI 34.41 kg/m  General:   Well developed, NAD, BMI noted. HEENT:  Normocephalic . Face symmetric, atraumatic Lungs:  CTA B Normal respiratory effort, no intercostal retractions, no accessory muscle use. Heart: RRR,  no murmur.  Lower extremities: no pretibial edema bilaterally  Skin: Not pale. Not jaundice Neurologic:  alert & oriented X3.  Speech normal, gait: Limited by knee pain from recent  surgery Psych--  Cognition and judgment appear intact.  Cooperative with normal attention span and concentration.  Behavior appropriate. No anxious or depressed appearing.      Assessment    Assessment   DM, + neuropathy. Foot exam 04-2016 HTN Anxiety, insomnia: On restoril prn, lexapro, xanax for flying only Thyroid nodule: Bx (non neoplastic goiter) 2014,  [place] 05-2016, next 2,3 years Pulmonary: --OSA, sleep study 2004, on a CPAP --Pulmonary emboli: Lifelong anticoagulation PEs:2010 and 12-2013 - saw hematology 12-2014, rec anticoag  x 2 years (until 12-2015), then continue with a low-dose xarelto   life   -- multiple pulmonary nodule, last CT 11-2013: No further imaging suggested --RAD, inhalers rarely Fibromyalgia CV: --MVP -- carotid US 1-39%s GI: --GERD,chronic dysphagia s/p  fundoplication 3, esophageal stenosis- - April 2021: Barium study, esophageal manometry (see report) -- IBS --Hemorrhoids: S/p banding ~/2019 -- chronic right flank,RUQ pain : since GB surgery 2008 Etiology not clear but likely multifactorial --> Adhesions from the surgery, neuropathy (DM), postherpetic neuralgia, scarring from PE, etc.  MRI T spine done 06-2014: DJD  Zoster --  2014 right flank  PLAN DM: Diet controlled, check A1c HTN: Seems well controlled on Maxide.  Last BMP satisfactory except for low calcium, check a ionizec Ca++. DJD: S/p R TKR May 2021, still having a great deal of pain, has been prescribed tramadol and oxycodone by Ortho. Insomnia, on Restoril, check a UDS. GERD, esophageal stenosis: Saw GI, s/p esophageal barium study f/u by a  esophageal motility study April 2021: Evidence of EG junction outflow obstruction  not consistent with achalasia. See reports  Hemorrhoids: Status post banding around 09/2017, still has occasional bleeding, less than before, recommend to contact GI if symptoms persist or severe. RTC 04-2020 CPX.   This visit occurred during the SARS-CoV-2 public health  emergency.  Safety protocols were in place, including screening questions prior to the visit, additional usage of staff PPE, and extensive cleaning of exam room while observing appropriate contact time as indicated for disinfecting solutions.

## 2019-11-17 NOTE — Progress Notes (Signed)
Pre visit review using our clinic review tool, if applicable. No additional management support is needed unless otherwise documented below in the visit note. 

## 2019-11-19 ENCOUNTER — Other Ambulatory Visit (INDEPENDENT_AMBULATORY_CARE_PROVIDER_SITE_OTHER): Payer: Medicare Other

## 2019-11-19 ENCOUNTER — Encounter: Payer: Self-pay | Admitting: Internal Medicine

## 2019-11-19 DIAGNOSIS — E119 Type 2 diabetes mellitus without complications: Secondary | ICD-10-CM

## 2019-11-19 DIAGNOSIS — I10 Essential (primary) hypertension: Secondary | ICD-10-CM

## 2019-11-19 LAB — DRUG MONITOR, TRAMADOL,QN, URINE
Desmethyltramadol: 2516 ng/mL — ABNORMAL HIGH (ref <100)
Tramadol: 4094 ng/mL — ABNORMAL HIGH (ref <100)

## 2019-11-19 LAB — DRUG MONITORING, PANEL 8 WITH CONFIRMATION, URINE
6 Acetylmorphine: NEGATIVE ng/mL (ref <10)
Alcohol Metabolites: NEGATIVE ng/mL
Alphahydroxyalprazolam: NEGATIVE ng/mL (ref <25)
Alphahydroxymidazolam: NEGATIVE ng/mL (ref <50)
Alphahydroxytriazolam: NEGATIVE ng/mL (ref <50)
Aminoclonazepam: NEGATIVE ng/mL (ref <25)
Amphetamines: NEGATIVE ng/mL (ref <500)
Benzodiazepines: POSITIVE ng/mL — AB (ref <100)
Buprenorphine, Urine: NEGATIVE ng/mL (ref <5)
Cocaine Metabolite: NEGATIVE ng/mL (ref <150)
Creatinine: 209.4 mg/dL
Hydroxyethylflurazepam: NEGATIVE ng/mL (ref <50)
Lorazepam: NEGATIVE ng/mL (ref <50)
MDMA: NEGATIVE ng/mL (ref <500)
Marijuana Metabolite: NEGATIVE ng/mL (ref <20)
Nordiazepam: NEGATIVE ng/mL (ref <50)
Opiates: NEGATIVE ng/mL (ref <100)
Oxazepam: 1387 ng/mL — ABNORMAL HIGH (ref <50)
Oxidant: NEGATIVE ug/mL
Oxycodone: NEGATIVE ng/mL (ref <100)
Temazepam: >8000 ng/mL — ABNORMAL HIGH (ref <50)
pH: 7.6 (ref 4.5–9.0)

## 2019-11-19 LAB — DM TEMPLATE

## 2019-11-19 LAB — HEMOGLOBIN A1C: Hgb A1c MFr Bld: 6 % (ref 4.6–6.5)

## 2019-11-20 LAB — CALCIUM, IONIZED: Calcium, Ion: 4.82 mg/dL (ref 4.8–5.6)

## 2019-12-01 ENCOUNTER — Other Ambulatory Visit: Payer: Self-pay | Admitting: *Deleted

## 2019-12-01 ENCOUNTER — Other Ambulatory Visit: Payer: Self-pay | Admitting: Hematology & Oncology

## 2019-12-01 MED ORDER — XARELTO 2.5 MG PO TABS: 5.0000 mg | ORAL_TABLET | Freq: Every day | ORAL | 0 refills | Status: DC

## 2019-12-15 ENCOUNTER — Other Ambulatory Visit: Payer: Self-pay

## 2019-12-15 ENCOUNTER — Ambulatory Visit
Admission: RE | Admit: 2019-12-15 | Discharge: 2019-12-15 | Disposition: A | Discharge status: Home / Self Care | Payer: Medicare Other | Source: Ambulatory Visit | Attending: Obstetrics & Gynecology | Admitting: Obstetrics & Gynecology

## 2019-12-15 DIAGNOSIS — Z01419 Encounter for gynecological examination (general) (routine) without abnormal findings: Secondary | ICD-10-CM

## 2019-12-15 DIAGNOSIS — Z1239 Encounter for other screening for malignant neoplasm of breast: Secondary | ICD-10-CM

## 2019-12-15 DIAGNOSIS — Z1231 Encounter for screening mammogram for malignant neoplasm of breast: Secondary | ICD-10-CM | POA: Diagnosis not present

## 2019-12-16 DIAGNOSIS — H2000 Unspecified acute and subacute iridocyclitis: Secondary | ICD-10-CM | POA: Diagnosis not present

## 2019-12-16 DIAGNOSIS — H40023 Open angle with borderline findings, high risk, bilateral: Secondary | ICD-10-CM | POA: Diagnosis not present

## 2019-12-16 DIAGNOSIS — H15101 Unspecified episcleritis, right eye: Secondary | ICD-10-CM | POA: Diagnosis not present

## 2019-12-17 ENCOUNTER — Telehealth: Payer: Self-pay | Admitting: Family

## 2019-12-17 NOTE — Telephone Encounter (Signed)
Patient called requesting to reschedule 8/27 appt to week of 8/30 since she was not a morning person and needed a late afternoon appt.  Appointments moved as requested

## 2019-12-19 ENCOUNTER — Inpatient Hospital Stay: Payer: Medicare Other

## 2019-12-19 ENCOUNTER — Other Ambulatory Visit: Payer: Medicare Other

## 2019-12-19 ENCOUNTER — Inpatient Hospital Stay: Payer: Medicare Other | Admitting: Family

## 2019-12-19 ENCOUNTER — Ambulatory Visit: Payer: Medicare Other | Admitting: Family

## 2019-12-25 ENCOUNTER — Inpatient Hospital Stay (HOSPITAL_BASED_OUTPATIENT_CLINIC_OR_DEPARTMENT_OTHER): Payer: Medicare Other | Admitting: Family

## 2019-12-25 ENCOUNTER — Inpatient Hospital Stay: Payer: Medicare Other | Attending: Hematology & Oncology

## 2019-12-25 ENCOUNTER — Other Ambulatory Visit: Payer: Self-pay

## 2019-12-25 ENCOUNTER — Encounter: Payer: Self-pay | Admitting: Family

## 2019-12-25 VITALS — BP 145/75 | HR 65 | Temp 98.3°F | Resp 18 | Ht 69.0 in | Wt 234.4 lb

## 2019-12-25 DIAGNOSIS — Z7901 Long term (current) use of anticoagulants: Secondary | ICD-10-CM | POA: Insufficient documentation

## 2019-12-25 DIAGNOSIS — I2699 Other pulmonary embolism without acute cor pulmonale: Secondary | ICD-10-CM | POA: Insufficient documentation

## 2019-12-25 DIAGNOSIS — I2601 Septic pulmonary embolism with acute cor pulmonale: Secondary | ICD-10-CM | POA: Diagnosis not present

## 2019-12-25 DIAGNOSIS — I2609 Other pulmonary embolism with acute cor pulmonale: Secondary | ICD-10-CM

## 2019-12-25 LAB — CMP (CANCER CENTER ONLY)
ALT: 10 U/L (ref 0–44)
AST: 15 U/L (ref 15–41)
Albumin: 4.1 g/dL (ref 3.5–5.0)
Alkaline Phosphatase: 60 U/L (ref 38–126)
Anion gap: 7 (ref 5–15)
BUN: 18 mg/dL (ref 8–23)
CO2: 33 mmol/L — ABNORMAL HIGH (ref 22–32)
Calcium: 9.6 mg/dL (ref 8.9–10.3)
Chloride: 102 mmol/L (ref 98–111)
Creatinine: 0.99 mg/dL (ref 0.44–1.00)
GFR, Est AFR Am: >60 mL/min (ref >60)
GFR, Estimated: 59 mL/min — ABNORMAL LOW (ref >60)
Glucose, Bld: 91 mg/dL (ref 70–99)
Potassium: 3.8 mmol/L (ref 3.5–5.1)
Sodium: 142 mmol/L (ref 135–145)
Total Bilirubin: 0.4 mg/dL (ref 0.3–1.2)
Total Protein: 7.6 g/dL (ref 6.5–8.1)

## 2019-12-25 LAB — CBC WITH DIFFERENTIAL (CANCER CENTER ONLY)
Abs Immature Granulocytes: 0.01 10*3/uL (ref 0.00–0.07)
Basophils Absolute: 0 10*3/uL (ref 0.0–0.1)
Basophils Relative: 1 %
Eosinophils Absolute: 0.1 10*3/uL (ref 0.0–0.5)
Eosinophils Relative: 3 %
HCT: 35.6 % — ABNORMAL LOW (ref 36.0–46.0)
Hemoglobin: 11.4 g/dL — ABNORMAL LOW (ref 12.0–15.0)
Immature Granulocytes: 0 %
Lymphocytes Relative: 41 %
Lymphs Abs: 1.7 10*3/uL (ref 0.7–4.0)
MCH: 27.5 pg (ref 26.0–34.0)
MCHC: 32 g/dL (ref 30.0–36.0)
MCV: 86 fL (ref 80.0–100.0)
Monocytes Absolute: 0.5 10*3/uL (ref 0.1–1.0)
Monocytes Relative: 11 %
Neutro Abs: 1.8 10*3/uL (ref 1.7–7.7)
Neutrophils Relative %: 44 %
Platelet Count: 251 10*3/uL (ref 150–400)
RBC: 4.14 MIL/uL (ref 3.87–5.11)
RDW: 16 % — ABNORMAL HIGH (ref 11.5–15.5)
WBC Count: 4.1 10*3/uL (ref 4.0–10.5)
nRBC: 0 % (ref 0.0–0.2)

## 2019-12-25 LAB — D-DIMER, QUANTITATIVE: D-Dimer, Quant: 2.51 ug/mL-FEU — ABNORMAL HIGH (ref 0.00–0.50)

## 2019-12-25 NOTE — Progress Notes (Signed)
Hematology and Oncology Follow Up Visit  Jennifer [name] [id number] 1951-03-19 69 y.o. 12/25/2019   Principle Diagnosis:  Recurrent pulmonary embolism  Current Therapy:        Xarelto 5 mg po q day - lifelong   Interim History:  Jennifer [name] is here today for follow-up. She is doing well and had a successful right knee replacement.  She has finished PT and is exercising herself at home. She bought a recumbent bike which she uses regularly.  She states that she stays cold on the blood thinner.  No episodes of bleeding. No bruising or petechiae.  No fever, n/v, cough, rash, dizziness, SOB, chest pain, palpitations, abdominal pain or changes in bowel or bladder habits.  No swelling, tenderness, numbness or tingling in her extremities.  She ambulates with a cane for added support.  She has maintained a good appetite and is staying well hydrated. Her weight is stable.   ECOG Performance Status: 1 - Symptomatic but completely ambulatory  Medications:  Allergies as of 12/25/2019      Reactions   Morphine And Related Anaphylaxis, Shortness Of Breath   Sulfonamide Derivatives Hives   Burn from inside out   Promethazine Hcl Other (See Comments)   Hallucinations      Medication List       Accurate as of December 25, 2019  2:07 PM. If you have any questions, ask your nurse or doctor.        acetaminophen 500 MG tablet Commonly known as: TYLENOL Take 1 tablet (500 mg total) by mouth every 6 (six) hours as needed. What changed: reasons to take this   Aciphex 20 MG tablet Generic drug: RABEprazole Take 1 tablet by mouth twice daily   ALPRAZolam 0.5 MG tablet Commonly known as: XANAX Take 0.5 mg by mouth at bedtime as needed for anxiety.   cycloSPORINE 0.05 % ophthalmic emulsion Commonly known as: RESTASIS Place 1 drop into both eyes 2 (two) times daily.   escitalopram 10 MG tablet Commonly known as: LEXAPRO Take 1 tablet (10 mg total) by mouth daily.   glucose blood test  strip Check blood sugar once daily   hydrocortisone valerate cream 0.2 % Commonly known as: WESTCORT Apply 1 application topically daily as needed (eczema). eczema   hyoscyamine 0.125 MG SL tablet Commonly known as: LEVSIN SL DISSOLVE ONE TABLET IN MOUTH EVERY 4 HOURS AS NEEDED What changed:   how much to take  how to take this  when to take this  reasons to take this  additional instructions   multivitamin with minerals tablet Take 1 tablet by mouth daily. Rainbow light   OneTouch Delica Lancets 59F Misc Check blood sugar once daily   oxyCODONE 5 MG immediate release tablet Commonly known as: Oxy IR/ROXICODONE Take 1-2 tablets (5-10 mg total) by mouth every 6 (six) hours as needed for severe pain. Not to exceed 6 tablets a day.   Prevagen 10 MG Caps Generic drug: Apoaequorin Take 10 mg by mouth daily. prevagen   temazepam 30 MG capsule Commonly known as: RESTORIL TAKE 1 CAPSULE BY MOUTH AT BEDTIME AS NEEDED FOR SLEEP   traMADol 50 MG tablet Commonly known as: ULTRAM Take 1-2 tablets (50-100 mg total) by mouth every 6 (six) hours as needed for moderate pain.   triamterene-hydrochlorothiazide 37.5-25 MG tablet Commonly known as: MAXZIDE-25 Take 0.5 tablets by mouth daily.   Xarelto 2.5 MG Tabs tablet Generic drug: rivaroxaban Take 2 tablets (5 mg total) by mouth daily.  Allergies:  Allergies  Allergen Reactions  . Morphine And Related Anaphylaxis and Shortness Of Breath  . Sulfonamide Derivatives Hives    Burn from inside out  . Promethazine Hcl Other (See Comments)    Hallucinations    Past Medical History, Surgical history, Social history, and Family History were reviewed and updated.  Review of Systems: All other 10 point review of systems is negative.   Physical Exam:  vitals were not taken for this visit.   Wt Readings from Last 3 Encounters:  11/17/19 (!) 233 lb (105.7 kg)  08/25/19 256 lb (116.1 kg)  08/19/19 256 lb (116.1 kg)     Ocular: Sclerae unicteric, pupils equal, round and reactive to light Ear-nose-throat: Oropharynx clear, dentition fair Lymphatic: No cervical or supraclavicular adenopathy Lungs no rales or rhonchi, good excursion bilaterally Heart regular rate and rhythm, no murmur appreciated Abd soft, nontender, positive bowel sounds MSK no focal spinal tenderness, no joint edema Neuro: non-focal, well-oriented, appropriate affect Breasts: Deferred   Lab Results  Component Value Date   WBC 4.1 12/25/2019   HGB 11.4 (L) 12/25/2019   HCT 35.6 (L) 12/25/2019   MCV 86.0 12/25/2019   PLT 251 12/25/2019   Lab Results  Component Value Date   FERRITIN 165 08/13/2017   IRON 60 08/13/2017   TIBC 260 08/13/2017   UIBC 200 08/13/2017   IRONPCTSAT 23 08/13/2017   Lab Results  Component Value Date   RBC 4.14 12/25/2019   No results found for: KPAFRELGTCHN, LAMBDASER, KAPLAMBRATIO No results found for: IGGSERUM, IGA, IGMSERUM No results found for: [name], SPEI   Chemistry      Component Value Date/Time   NA 139 08/27/2019 0304   NA 143 02/16/2017 1450   NA 142 01/19/2016 1141   K 4.0 08/27/2019 0304   K 3.7 02/16/2017 1450   K 3.7 01/19/2016 1141   CL 103 08/27/2019 0304   CL 105 02/16/2017 1450   CO2 30 08/27/2019 0304   CO2 32 02/16/2017 1450   CO2 27 01/19/2016 1141   BUN 21 08/27/2019 0304   BUN 14 02/16/2017 1450   BUN 19.5 01/19/2016 1141   CREATININE 0.86 08/27/2019 0304   CREATININE 0.92 05/12/2019 1204   CREATININE 1.2 02/16/2017 1450   CREATININE 0.9 01/19/2016 1141      Component Value Date/Time   CALCIUM 8.2 (L) 08/27/2019 0304   CALCIUM 8.8 02/16/2017 1450   CALCIUM 8.8 01/19/2016 1141   ALKPHOS 57 08/15/2019 1521   ALKPHOS 57 02/16/2017 1450   ALKPHOS 59 01/19/2016 1141   AST 25 08/15/2019 1521   AST 19 05/12/2019 1204   AST 21 01/19/2016 1141   ALT 22 08/15/2019 1521   ALT 16 05/12/2019 1204   ALT  27 02/16/2017 1450   ALT 18 01/19/2016 1141   BILITOT 0.5 08/15/2019 1521   BILITOT 0.3 05/12/2019 1204   BILITOT 0.31 01/19/2016 1141       Impression and Plan: Jennifer [name] is a very pleasant 69 yo African American female with history of recurrent PE. Her initial thrombus was diagnosed in 2012 and then recurred in 2015.  She is on lifelong anticoagulation with Xarelto 5 mg PO daily and tolerating well.  We will plan to see her again in another 4 months.  She can contact our office with any questions or concerns.   Laverna Peace, NP 9/2/20212:07 PM

## 2020-01-05 ENCOUNTER — Telehealth: Payer: Self-pay | Admitting: Internal Medicine

## 2020-01-05 ENCOUNTER — Other Ambulatory Visit: Payer: Self-pay | Admitting: Hematology & Oncology

## 2020-01-05 NOTE — Telephone Encounter (Signed)
PDMP reviewed, prescribed tramadol elsewhere. Refill temazepam. Deleted Xanax as she is only taking temazepam

## 2020-01-05 NOTE — Telephone Encounter (Signed)
Temazepam refill.   Last OV: 11/17/2019 Last Fill: 11/17/2019 #30 and 0RF Pt sig:1 capsule qhs prn UDS: 11/17/2019 Low risk

## 2020-01-08 ENCOUNTER — Other Ambulatory Visit: Payer: Self-pay | Admitting: Internal Medicine

## 2020-01-08 ENCOUNTER — Ambulatory Visit: Payer: Medicare Other | Admitting: *Deleted

## 2020-01-13 DIAGNOSIS — H2000 Unspecified acute and subacute iridocyclitis: Secondary | ICD-10-CM | POA: Diagnosis not present

## 2020-01-13 DIAGNOSIS — H15101 Unspecified episcleritis, right eye: Secondary | ICD-10-CM | POA: Diagnosis not present

## 2020-01-15 DIAGNOSIS — L308 Other specified dermatitis: Secondary | ICD-10-CM | POA: Diagnosis not present

## 2020-01-15 DIAGNOSIS — L821 Other seborrheic keratosis: Secondary | ICD-10-CM | POA: Diagnosis not present

## 2020-01-15 DIAGNOSIS — L819 Disorder of pigmentation, unspecified: Secondary | ICD-10-CM | POA: Diagnosis not present

## 2020-01-23 ENCOUNTER — Other Ambulatory Visit: Payer: Self-pay | Admitting: Hematology & Oncology

## 2020-01-27 ENCOUNTER — Encounter: Payer: Self-pay | Admitting: Internal Medicine

## 2020-02-02 ENCOUNTER — Other Ambulatory Visit: Payer: Self-pay | Admitting: Internal Medicine

## 2020-02-06 ENCOUNTER — Other Ambulatory Visit: Payer: Self-pay

## 2020-02-06 ENCOUNTER — Ambulatory Visit (INDEPENDENT_AMBULATORY_CARE_PROVIDER_SITE_OTHER): Payer: Medicare Other | Admitting: Pulmonary Disease

## 2020-02-06 ENCOUNTER — Encounter: Payer: Self-pay | Admitting: Pulmonary Disease

## 2020-02-06 VITALS — BP 124/78 | HR 82 | Temp 97.2°F | Ht 69.0 in | Wt 239.6 lb

## 2020-02-06 DIAGNOSIS — G4733 Obstructive sleep apnea (adult) (pediatric): Secondary | ICD-10-CM

## 2020-02-06 DIAGNOSIS — G4710 Hypersomnia, unspecified: Secondary | ICD-10-CM

## 2020-02-06 DIAGNOSIS — G4730 Sleep apnea, unspecified: Secondary | ICD-10-CM | POA: Diagnosis not present

## 2020-02-06 NOTE — Assessment & Plan Note (Signed)
In spite of her weight loss, she continues to have residual events on CPAP download on auto settings 12 to 18 cm. I will increase auto settings to 12 to 20 cm and recheck download.  If somnolence persists another option would be to add a stimulant such as modafinil  Weight loss encouraged, compliance with goal of at least 4-6 hrs every night is the expectation. Advised against medications with sedative side effects Cautioned against driving when sleepy - understanding that sleepiness will vary on a day to day basis

## 2020-02-06 NOTE — Progress Notes (Signed)
   Subjective:    Patient ID: Jennifer [name], female    DOB: 07-Mar-1951, 69 y.o.   MRN: [id number]  HPI   57 yonever smokerfor FU ofpositional obstructive sleep apnea She has mild intermittent cough attributed to GERD - has Nisen's x 3 -lastdilation 2018by[facility] - PE in 9/2015on Xarelto . ED visit for laughter induced syncope.  episodes of syncope and cough -resolved  On her last visit, she had residual events on CPAP download and we increased to auto settings 12 to 18 cm.  She continues to be very compliant.  Still reports some daytime residual tiredness.  No problems with pressure. CPAP download was reviewed on auto 12 to 18 cm which shows residual AHI of 10/hour with obstructive events 6.6/hour and very few centrals. She underwent right knee replacement and has recovered well with rehab. She has lost weight from 266 to her current weight of 239 pounds      Significant tests/ events reviewed  PSG 01/2003-wt210 lbs showed a TST of 250 mins incl 37 mins of REM, RDI was 15/h with lowest desaturation of 84% &moderate snoring, predominantly REM related events.  PSG april'11 (256 lbs )>>Mild-to-moderate obstructive sleep apnea - RDI was 17/h, non supine AHI was only 1.7/h   6/ 2011- HST AHI of 10/h with desaturation index 12/h .  psg (250 lbs) aug'11 - cpap titrated to 9 cm, small full face mask    Review of Systems neg for any significant sore throat, dysphagia, itching, sneezing, nasal congestion or excess/ purulent secretions, fever, chills, sweats, unintended wt loss, pleuritic or exertional cp, hempoptysis, orthopnea pnd or change in chronic leg swelling. Also denies presyncope, palpitations, heartburn, abdominal pain, nausea, vomiting, diarrhea or change in bowel or urinary habits, dysuria,hematuria, rash, arthralgias, visual complaints, headache, numbness weakness or ataxia.     Objective:   Physical Exam  Gen. Pleasant, obese, in no distress ENT -  no lesions, no post nasal drip Neck: No JVD, no thyromegaly, no carotid bruits Lungs: no use of accessory muscles, no dullness to percussion, decreased without rales or rhonchi  Cardiovascular: Rhythm regular, heart sounds  normal, no murmurs or gallops, no peripheral edema Musculoskeletal: No deformities, no cyanosis or clubbing , no tremors       Assessment & Plan:

## 2020-02-06 NOTE — Assessment & Plan Note (Signed)
I congratulated her on her weight loss. Continue diet and exercise

## 2020-02-06 NOTE — Patient Instructions (Signed)
Increase auto CPAP to 12-20 cm CPAP supplies will be renewed for a year

## 2020-02-11 ENCOUNTER — Ambulatory Visit (HOSPITAL_BASED_OUTPATIENT_CLINIC_OR_DEPARTMENT_OTHER)
Admission: RE | Admit: 2020-02-11 | Discharge: 2020-02-11 | Disposition: A | Discharge status: Home / Self Care | Payer: Medicare Other | Source: Ambulatory Visit | Attending: Internal Medicine | Admitting: Internal Medicine

## 2020-02-11 ENCOUNTER — Encounter: Payer: Self-pay | Admitting: Internal Medicine

## 2020-02-11 ENCOUNTER — Ambulatory Visit (INDEPENDENT_AMBULATORY_CARE_PROVIDER_SITE_OTHER): Payer: Medicare Other | Admitting: Internal Medicine

## 2020-02-11 ENCOUNTER — Other Ambulatory Visit: Payer: Self-pay

## 2020-02-11 VITALS — BP 117/70 | HR 73 | Temp 98.1°F | Resp 18 | Ht 69.0 in | Wt 236.2 lb

## 2020-02-11 DIAGNOSIS — R42 Dizziness and giddiness: Secondary | ICD-10-CM

## 2020-02-11 DIAGNOSIS — H9313 Tinnitus, bilateral: Secondary | ICD-10-CM

## 2020-02-11 DIAGNOSIS — H9209 Otalgia, unspecified ear: Secondary | ICD-10-CM | POA: Diagnosis not present

## 2020-02-11 DIAGNOSIS — F419 Anxiety disorder, unspecified: Secondary | ICD-10-CM

## 2020-02-11 NOTE — Patient Instructions (Signed)
Proceed with a CAT scan, please go to the first floor  Rest, drink plenty of fluids.  If the dizziness or cloudy head continue let me know

## 2020-02-11 NOTE — Progress Notes (Signed)
Subjective:    Patient ID: Jennifer [name], female    DOB: 01/06/1951, 69 y.o.   MRN: [id number]  DOS:  02/11/2020 Type of visit - description: Acute Symptoms lasted 4 days: When she stood up, she would get unsteady, wobbly, dizzy.  She would also have bilateral ear tinnitus described as a pulsatile sensation. Symptoms would abate when she las down. After 4 days things are better but she still has some "Cloudiness of the head". She has a very hard time describing the symptoms further , I cannot exactly pinpoint what cloudiness means to the patient. She says she had vertigo before that this is somewhat different.  Review of Systems Denies chest pain or difficulty breathing.  No edema or palpitations Denies nuchal rigidity. Admits to mild frontal headache for few days. No diplopia, slurred speech or motor deficits Denies any recent URI or sinus congestion No head injury   Past Medical History:  Diagnosis Date  . Anemia   . Arthritis   . Chronic chest pain   . Chronic fatigue 02/03/2017  . Colon polyps    inflamed partially ulcerated hyperplastic polyp  . Diabetes ([place]) 09/24/2013  . Diverticulosis   . Dysphagia esophageal phase - chronic after 3 fundoplications 2/44/0102  . Esophageal stenosis    Stricture after fundoplication REDO  . Fibromyalgia   . GERD (gastroesophageal reflux disease)   . Heart murmur    no problems   . Hemorrhoids, internal, with bleeding 06/27/2017  . History of hiatal hernia   . Hypertension   . IBS (irritable bowel syndrome)   . Mitral valve prolapse   . OSA (obstructive sleep apnea)    on CPAP-Mild - Sleep study 2004  . Pulmonary embolus ([place])    2010 and 12-2013  . Sleep apnea    wears CPAP  . Thyroid nodule      dr. watching   . Tinnitus    Subjective  . Zoster 2014   R flank    Past Surgical History:  Procedure Laterality Date  . ABDOMINAL HYSTERECTOMY     still has 1 ovary   . APPENDECTOMY    . [place]   Stem surgery,  Arnold Chiari Malformation  . BREAST REDUCTION SURGERY    . CATARACT EXTRACTION Bilateral 2017   cataracts, Dr. Herbert Deaner  . ESOPHAGEAL MANOMETRY N/A 08/06/2019   Procedure: ESOPHAGEAL MANOMETRY (EM);  Surgeon: Gatha Mayer, MD;  Location: [facility];  Service: Endoscopy;  Laterality: N/A;  . HEMORRHOID BANDING    . HERNIA REPAIR     Hital Hernia  . KNEE ARTHROSCOPY  05/16/2012   Procedure: ARTHROSCOPY KNEE;  Surgeon: Sharmon Revere, MD;  Location: [facility];  Service: Orthopedics;  Laterality: Left;  . KNEE ARTHROSCOPY Right 01/2018  . NISSEN FUNDOPLICATION     X 3 lab, open x 2 last with mesh  . REDUCTION MAMMAPLASTY Bilateral 2007  . TOTAL KNEE ARTHROPLASTY Right 08/25/2019   Procedure: RIGHT TOTAL KNEE ARTHROPLASTY;  Surgeon: Gaynelle Arabian, MD;  Location: [facility];  Service: Orthopedics;  Laterality: Right;  46min    Allergies as of 02/11/2020      Reactions   Morphine And Related Anaphylaxis, Shortness Of Breath   Sulfonamide Derivatives Hives   Burn from inside out   Promethazine Hcl Other (See Comments)   Hallucinations      Medication List       Accurate as of February 11, 2020 11:59 PM. If you have any questions,  ask your nurse or doctor.        acetaminophen 500 MG tablet Commonly known as: TYLENOL Take 1 tablet (500 mg total) by mouth every 6 (six) hours as needed. What changed: reasons to take this   Aciphex 20 MG tablet Generic drug: RABEprazole Take 1 tablet by mouth twice daily   cycloSPORINE 0.05 % ophthalmic emulsion Commonly known as: RESTASIS Place 1 drop into both eyes 2 (two) times daily.   escitalopram 10 MG tablet Commonly known as: LEXAPRO Take 1 tablet (10 mg total) by mouth daily.   glucose blood test strip Check blood sugar once daily   hydrocortisone valerate cream 0.2 % Commonly known as: WESTCORT Apply 1 application topically daily as needed (eczema). eczema   hyoscyamine 0.125 MG SL tablet Commonly known as: LEVSIN SL DISSOLVE ONE  TABLET IN MOUTH EVERY 4 HOURS AS NEEDED What changed:   how much to take  how to take this  when to take this  reasons to take this  additional instructions   multivitamin with minerals tablet Take 1 tablet by mouth daily. Rainbow light   OneTouch Delica Lancets 85I Misc Check blood sugar once daily   Prevagen 10 MG Caps Generic drug: Apoaequorin Take 10 mg by mouth daily. prevagen   temazepam 30 MG capsule Commonly known as: RESTORIL TAKE 1 CAPSULE BY MOUTH AT BEDTIME AS NEEDED FOR SLEEP   traMADol 50 MG tablet Commonly known as: ULTRAM Take 1-2 tablets (50-100 mg total) by mouth every 6 (six) hours as needed for moderate pain.   triamterene-hydrochlorothiazide 37.5-25 MG tablet Commonly known as: MAXZIDE-25 Take 0.5 tablets by mouth daily.   Xarelto 2.5 MG Tabs tablet Generic drug: rivaroxaban Take 2 tablets by mouth once daily          Objective:   Physical Exam BP 117/70 (BP Location: Left Arm, Patient Position: Sitting)   Pulse 73   Temp 98.1 F (36.7 C)   Resp 18   Ht 5\' 9"  (1.753 m)   Wt 236 lb 3.2 oz (107.1 kg)   BMI 34.88 kg/m  General:   Well developed, NAD, BMI noted. HEENT:  Normocephalic . Face symmetric, atraumatic Ears normal, nose not congested. Neck: Normal carotid pulses, full range of motion. Lungs:  CTA B Normal respiratory effort, no intercostal retractions, no accessory muscle use. Heart: RRR,  no murmur.  Lower extremities: no pretibial edema bilaterally  Skin: Not pale. Not jaundice Neurologic:  alert & oriented X3.  Speech normal, gait appropriate for age and unassisted EOMI, pupils equal and reactive. Motor: Symmetric except mild difficulty moving the right leg due to her knee pain. DTRs: Symmetric (did not tap the right knee due to patient pain) Psych--  Cognition and judgment appear intact.  Cooperative with normal attention span and concentration.  Behavior appropriate. No anxious or depressed appearing.       Assessment      Assessment   DM, + neuropathy. Foot exam 04-2016 HTN Anxiety, insomnia: On restoril prn, lexapro, xanax for flying only Thyroid nodule: Bx (non neoplastic goiter) 2014,  [place] 05-2016, next 2,3 years Pulmonary: --OSA, sleep study 2004, on a CPAP --Pulmonary emboli: Lifelong anticoagulation PEs:2010 and 12-2013 - saw hematology 12-2014, rec anticoag x 2 years (until 12-2015), then continue with a low-dose xarelto   life   -- multiple pulmonary nodule, last CT 11-2013: No further imaging suggested --RAD, inhalers rarely Fibromyalgia CV: --MVP -- carotid US 1-39%s GI: --GERD,chronic dysphagia s/p  fundoplication 3, esophageal stenosis- -  April 2021: Barium study, esophageal manometry (see report) -- IBS --Hemorrhoids: S/p banding ~/2019 -- chronic right flank,RUQ pain : since GB surgery 2008 Etiology not clear but likely multifactorial --> Adhesions from the surgery, neuropathy (DM), postherpetic neuralgia, scarring from PE, etc.  MRI T spine done 06-2014: DJD  Zoster --  2014 right flank  PLAN Dizziness: New issue. The patient has a hard time describing the symptoms, they lasted 4 days, she is better but she still have some ill-defined frontal headache and "cloudiness of the head". Suspect symptoms are peripheral however history taking is challenging and she is anticoagulated.  We will proceed with a CT head without. Other than that recommend rest, fluids, call if not gradually better. Anticoagulation: Saw hematology 12-2019, they rec  continue with lifelong anticoagulation.  Xarelto 5 mg daily Anxiety, insomnia, contract signed (need for tramadol and temazepam)  This visit occurred during the SARS-CoV-2 public health emergency.  Safety protocols were in place, including screening questions prior to the visit, additional usage of staff PPE, and extensive cleaning of exam room while observing appropriate contact time as indicated for disinfecting solutions.

## 2020-02-13 NOTE — Assessment & Plan Note (Signed)
Dizziness: New issue. The patient has a hard time describing the symptoms, they lasted 4 days, she is better but she still have some ill-defined frontal headache and "cloudiness of the head". Suspect symptoms are peripheral however history taking is challenging and she is anticoagulated.  We will proceed with a CT head without. Other than that recommend rest, fluids, call if not gradually better. Anticoagulation: Saw hematology 12-2019, they rec  continue with lifelong anticoagulation.  Xarelto 5 mg daily Anxiety, insomnia, contract signed (need for tramadol and temazepam)

## 2020-02-16 ENCOUNTER — Encounter: Payer: Self-pay | Admitting: Internal Medicine

## 2020-02-16 DIAGNOSIS — L603 Nail dystrophy: Secondary | ICD-10-CM

## 2020-03-03 ENCOUNTER — Other Ambulatory Visit: Payer: Self-pay | Admitting: Internal Medicine

## 2020-03-03 ENCOUNTER — Other Ambulatory Visit: Payer: Self-pay | Admitting: Hematology & Oncology

## 2020-03-04 ENCOUNTER — Ambulatory Visit (INDEPENDENT_AMBULATORY_CARE_PROVIDER_SITE_OTHER): Payer: Medicare Other | Admitting: Podiatry

## 2020-03-04 ENCOUNTER — Encounter: Payer: Self-pay | Admitting: Podiatry

## 2020-03-04 ENCOUNTER — Other Ambulatory Visit: Payer: Self-pay

## 2020-03-04 DIAGNOSIS — L600 Ingrowing nail: Secondary | ICD-10-CM

## 2020-03-04 DIAGNOSIS — B351 Tinea unguium: Secondary | ICD-10-CM | POA: Diagnosis not present

## 2020-03-04 NOTE — Patient Instructions (Signed)
Place 1/4 cup of epsom salts in a quart of warm tap water.  Submerge your foot or feet in the solution and soak for 20 minutes.  This soak should be done twice a day.  Next, remove your foot or feet from solution, blot dry the affected area. Apply ointment and cover if instructed by your doctor.   IF YOUR SKIN BECOMES IRRITATED WHILE USING THESE INSTRUCTIONS, IT IS OKAY TO SWITCH TO  WHITE VINEGAR AND WATER.  As another alternative soak, you may use antibacterial soap and water.  Monitor for any signs/symptoms of infection. Call the office immediately if any occur or go directly to the emergency room. Call with any questions/concerns.  Ingrown Toenail An ingrown toenail occurs when the corner or sides of a toenail grow into the surrounding skin. This causes discomfort and pain. The big toe is most commonly affected, but any of the toes can be affected. If an ingrown toenail is not treated, it can become infected. What are the causes? This condition may be caused by:  Wearing shoes that are too small or tight.  An injury, such as stubbing your toe or having your toe stepped on.  Improper cutting or care of your toenails.  Having nail or foot abnormalities that were present from birth (congenital abnormalities), such as having a nail that is too big for your toe. What increases the risk? The following factors may make you more likely to develop ingrown toenails:  Age. Nails tend to get thicker with age, so ingrown nails are more common among older people.  Cutting your toenails incorrectly, such as cutting them very short or cutting them unevenly. An ingrown toenail is more likely to get infected if you have:  Diabetes.  Blood flow (circulation) problems. What are the signs or symptoms? Symptoms of an ingrown toenail may include:  Pain, soreness, or tenderness.  Redness.  Swelling.  Hardening of the skin that surrounds the toenail. Signs that an ingrown toenail may be infected  include:  Fluid or pus.  Symptoms that get worse instead of better. How is this diagnosed? An ingrown toenail may be diagnosed based on your medical history, your symptoms, and a physical exam. If you have fluid or blood coming from your toenail, a sample may be collected to test for the specific type of bacteria that is causing the infection. How is this treated? Treatment depends on how severe your ingrown toenail is. You may be able to care for your toenail at home.  If you have an infection, you may be prescribed antibiotic medicines.  If you have fluid or pus draining from your toenail, your health care provider may drain it.  If you have trouble walking, you may be given crutches to use.  If you have a severe or infected ingrown toenail, you may need a procedure to remove part or all of the nail. Follow these instructions at home: Foot care   Do not pick at your toenail or try to remove it yourself.  Soak your foot in warm, soapy water. Do this for 20 minutes, 3 times a day, or as often as told by your health care provider. This helps to keep your toe clean and keep your skin soft.  Wear shoes that fit well and are not too tight. Your health care provider may recommend that you wear open-toed shoes while you heal.  Trim your toenails regularly and carefully. Cut your toenails straight across to prevent injury to the skin at the   corners of the toenail. Do not cut your nails in a curved shape.  Keep your feet clean and dry to help prevent infection. Medicines  Take over-the-counter and prescription medicines only as told by your health care provider.  If you were prescribed an antibiotic, take it as told by your health care provider. Do not stop taking the antibiotic even if you start to feel better. Activity  Return to your normal activities as told by your health care provider. Ask your health care provider what activities are safe for you.  Avoid activities that cause  pain. General instructions  If your health care provider told you to use crutches to help you move around, use them as instructed.  Keep all follow-up visits as told by your health care provider. This is important. Contact a health care provider if:  You have more redness, swelling, pain, or other symptoms that do not improve with treatment.  You have fluid, blood, or pus coming from your toenail. Get help right away if:  You have a red streak on your skin that starts at your foot and spreads up your leg.  You have a fever. Summary  An ingrown toenail occurs when the corner or sides of a toenail grow into the surrounding skin. This causes discomfort and pain. The big toe is most commonly affected, but any of the toes can be affected.  If an ingrown toenail is not treated, it can become infected.  Fluid or pus draining from your toenail is a sign of infection. Your health care provider may need to drain it. You may be given antibiotics to treat the infection.  Trimming your toenails regularly and properly can help you prevent an ingrown toenail. This information is not intended to replace advice given to you by your health care provider. Make sure you discuss any questions you have with your health care provider. Document Revised: 08/02/2018 Document Reviewed: 12/27/2016 Elsevier Patient Education  2020 [employer]  

## 2020-03-05 ENCOUNTER — Telehealth: Payer: Self-pay | Admitting: Podiatry

## 2020-03-05 NOTE — Progress Notes (Signed)
Subjective:   Patient ID: Jennifer [name], female   DOB: 69 y.o.   MRN: [id number]   HPI Patient presents stating she has had ingrown toenail deformity of the right big toe and second toe that she cannot take care of and she was concerned because they are a little bit dark but she does admit trauma to the nailbed.  Patient states that her sore other nails are sore but not to the same degree and at times she has had drainage in the past and she tries to cut them out herself.  Patient does not smoke likes to be active   Review of Systems  All other systems reviewed and are negative.       Objective:  Physical Exam Vitals and nursing note reviewed.  Constitutional:      Appearance: She is well-developed.  Pulmonary:     Effort: Pulmonary effort is normal.  Musculoskeletal:        General: Normal range of motion.  Skin:    General: Skin is warm.  Neurological:     Mental Status: She is alert.     Neurovascular status was found to be intact muscle strength was found to be adequate range of motion adequate.  Patient has good digital perfusion well oriented and was found to have incurvated medial border of the right hallux and second nail that are both sore and indication she has been trying to cut on them herself.  No active drainage was noted discoloration of several nails but that appears to be more due to trauma and is generalized with no streaking or anything of that type of pathology     Assessment:  Probability for ingrown toenail deformity right hallux and second toe medial border with traumatized nailbed with possible fungus     Plan:  H&P reviewed all conditions.  I have most concerned right now about her pain and I have recommended correction of these ingrown toenails and explained procedure and risk.  Today I went ahead and I allowed her to sign consent form after reading it over the right hallux second toe medial border and I infiltrated each toe 60 mg like Marcaine mixture.   Sterile prep done and using sterile instrumentation I remove the medial border of the right hallux right second toe exposed matrix and applied phenol 3 applications 30 seconds followed by alcohol lavage and sterile dressing.  Gave instructions for soaks and leave dressing on 24 hours but take them off earlier if any pathology were to occur was encouraged to call with questions concerns which may arise.  Her other nails hopefully will grow out normally but I do not see any other treatment currently

## 2020-03-05 NOTE — Telephone Encounter (Signed)
She doesn't need any med for this . Can just use neosporin otc

## 2020-03-05 NOTE — Telephone Encounter (Signed)
Patient called about an ointment she is supposed to put on her toe after soaking. She was not given anything or told what kind of ointment. The toe is painful.

## 2020-04-02 ENCOUNTER — Telehealth: Payer: Self-pay

## 2020-04-02 ENCOUNTER — Ambulatory Visit (INDEPENDENT_AMBULATORY_CARE_PROVIDER_SITE_OTHER): Payer: Medicare Other | Admitting: Gastroenterology

## 2020-04-02 ENCOUNTER — Encounter: Payer: Self-pay | Admitting: Gastroenterology

## 2020-04-02 VITALS — BP 110/58 | HR 71 | Ht 69.0 in | Wt 245.0 lb

## 2020-04-02 DIAGNOSIS — K222 Esophageal obstruction: Secondary | ICD-10-CM

## 2020-04-02 DIAGNOSIS — K589 Irritable bowel syndrome without diarrhea: Secondary | ICD-10-CM | POA: Diagnosis not present

## 2020-04-02 DIAGNOSIS — R1319 Other dysphagia: Secondary | ICD-10-CM

## 2020-04-02 DIAGNOSIS — R109 Unspecified abdominal pain: Secondary | ICD-10-CM

## 2020-04-02 MED ORDER — DICYCLOMINE HCL 20 MG PO TABS: 20.0000 mg | ORAL_TABLET | Freq: Three times a day (TID) | ORAL | 1 refills | Status: DC | PRN

## 2020-04-02 MED ORDER — SUCRALFATE 1 G PO TABS: ORAL_TABLET | ORAL | 1 refills | Status: DC

## 2020-04-02 NOTE — Patient Instructions (Addendum)
You have been scheduled for an endoscopy. Please follow written instructions given to you at your visit today. If you use inhalers (even only as needed), please bring them with you on the day of your procedure.  You will be due for a recall colonoscopy in 11/2020. We will send you a reminder in the mail when it gets closer to that time.  Please purchase Metamucil over the counter. Take as directed. Dicyclomine 20 mg every 8 hours as needed carafate 1 gram three times daily before meals as needed (make into slurry)  Please purchase the following medications over the counter and take as directed: IB Gard 1 capsule three times daily as needed  STOP Aciphex.  If you are age 58 or older, your body mass index should be between 23-30. Your Body mass index is 36.18 kg/m. If this is out of the aforementioned range listed, please consider follow up with your Primary Care Provider.  Due to recent changes in healthcare laws, you may see the results of your imaging and laboratory studies on MyChart before your provider has had a chance to review them.  We understand that in some cases there may be results that are confusing or concerning to you. Not all laboratory results come back in the same time frame and the provider may be waiting for multiple results in order to interpret others.  Please give [place] 48 hours in order for your provider to thoroughly review all the results before contacting the office for clarification of your results.

## 2020-04-02 NOTE — Telephone Encounter (Signed)
   Jennifer [name] [birth year] [id number]  Dear Dr Melida Gimenez [place]:  We have scheduled the above named patient for a(n) endoscopy procedure. Our records show that (s)he is on anticoagulation therapy.  Please advise as to whether the patient may come off their therapy of Xarelto 2 days prior to their procedure which is scheduled for 04/15/20.  Please route your response to Dixon Boos, CMA.  Sincerely,    [facility]

## 2020-04-02 NOTE — Progress Notes (Addendum)
Jennifer [name]    [id number]    [birth year]  Primary Care Physician:Paz, Alda Berthold, MD  Referring Physician: Colon Branch, MD [address] [place],  [place] [address]   Chief complaint:  Dysphagia  HPI:  69 yr very pleasant F with complaints of worsening dysphagia and irritable bowel syndrome  She is having difficulty swallowing with both liquids and solids, sometimes it hurts going down.  She has intermittent episodes of irritable bowel syndrome associated with abdominal spasms and cramping.  She takes hyoscyamine sublingual as needed with improvement of symptoms if she does not take it immediately she continues to have significant symptoms.  She carries the tablet with her all the time  She has history of chronic dysphagia, s/p Nissen fundoplication X3  Esophageal manometry August 06, 2019: Findings suggestive of EG junction outflow obstruction with intact peristalsis, negative for achalasia.  EGD September 28, 2016: It is post Nissen fundoplication with intact wrap, dilated with TTS balloon to 18 mm  Colonoscopy December 14, 2015: She had removal of small sessile polyps, one SSP and one tubular adenoma.  Due for recall colonoscopy August 2022  She is on chronic anticoagulation for DVT and PE  Outpatient Encounter Medications as of 04/02/2020  Medication Sig  . acetaminophen (TYLENOL) 500 MG tablet Take 1 tablet (500 mg total) by mouth every 6 (six) hours as needed.  . ACIPHEX 20 MG tablet Take 1 tablet by mouth twice daily  . Apoaequorin (PREVAGEN) 10 MG CAPS Take 10 mg by mouth daily. prevagen  . cycloSPORINE (RESTASIS) 0.05 % ophthalmic emulsion Place 1 drop into both eyes 2 (two) times daily.  Marland Kitchen escitalopram (LEXAPRO) 10 MG tablet Take 1 tablet (10 mg total) by mouth daily.  Marland Kitchen glucose blood test strip Check blood sugar once daily  . hydrocortisone valerate cream (WESTCORT) 0.2 % Apply 1 application topically daily as needed (eczema). eczema  . hyoscyamine  (LEVSIN SL) 0.125 MG SL tablet DISSOLVE ONE TABLET IN MOUTH EVERY 4 HOURS AS NEEDED  . Multiple Vitamins-Minerals (MULTIVITAMIN WITH MINERALS) tablet Take 1 tablet by mouth daily. Rainbow light  . ONETOUCH DELICA LANCETS 93X MISC Check blood sugar once daily  . temazepam (RESTORIL) 30 MG capsule TAKE 1 CAPSULE BY MOUTH AT BEDTIME AS NEEDED FOR SLEEP  . traMADol (ULTRAM) 50 MG tablet Take 1-2 tablets (50-100 mg total) by mouth every 6 (six) hours as needed for moderate pain.  Marland Kitchen triamterene-hydrochlorothiazide (MAXZIDE-25) 37.5-25 MG tablet Take 0.5 tablets by mouth daily.  Alveda Reasons 2.5 MG TABS tablet Take 2 tablets by mouth once daily   No facility-administered encounter medications on file as of 04/02/2020.    Allergies as of 04/02/2020 - Review Complete 04/02/2020  Allergen Reaction Noted  . Morphine and related Anaphylaxis and Shortness Of Breath 12/01/2013  . Sulfonamide derivatives Hives 12/02/2018  . Promethazine hcl Other (See Comments) 12/02/2018    Past Medical History:  Diagnosis Date  . Anemia   . Arthritis   . Chronic chest pain   . Chronic fatigue 02/03/2017  . Colon polyps    inflamed partially ulcerated hyperplastic polyp  . Diabetes ([place]) 09/24/2013  . Diverticulosis   . Dysphagia esophageal phase - chronic after 3 fundoplications 12/25/4095  . Esophageal stenosis    Stricture after fundoplication REDO  . Fibromyalgia   . GERD (gastroesophageal reflux disease)   . Heart murmur    no problems   .  Hemorrhoids, internal, with bleeding 06/27/2017  . History of hiatal hernia   . Hypertension   . IBS (irritable bowel syndrome)   . Mitral valve prolapse   . OSA (obstructive sleep apnea)    on CPAP-Mild - Sleep study 2002-08-04  . Pulmonary embolus ([place])    08/03/08 and 12-2013  . Sleep apnea    wears CPAP  . Thyroid nodule      dr. watching   . Tinnitus    Subjective  . Zoster 08/03/12   R flank    Past Surgical History:  Procedure Laterality Date  . ABDOMINAL  HYSTERECTOMY     still has 1 ovary   . APPENDECTOMY    . [place]   Stem surgery, Arnold Chiari Malformation  . BREAST REDUCTION SURGERY    . CATARACT EXTRACTION Bilateral 08-04-2015   cataracts, Dr. Herbert Deaner  . ESOPHAGEAL MANOMETRY N/A 08/06/2019   Procedure: ESOPHAGEAL MANOMETRY (EM);  Surgeon: Gatha Mayer, MD;  Location: [facility];  Service: Endoscopy;  Laterality: N/A;  . HEMORRHOID BANDING    . HERNIA REPAIR     Hital Hernia  . KNEE ARTHROSCOPY  05/16/2012   Procedure: ARTHROSCOPY KNEE;  Surgeon: Sharmon Revere, MD;  Location: [facility];  Service: Orthopedics;  Laterality: Left;  . KNEE ARTHROSCOPY Right 01/2018  . NISSEN FUNDOPLICATION     X 3 lab, open x 2 last with mesh  . REDUCTION MAMMAPLASTY Bilateral 08-03-2005  . TOTAL KNEE ARTHROPLASTY Right 08/25/2019   Procedure: RIGHT TOTAL KNEE ARTHROPLASTY;  Surgeon: Gaynelle Arabian, MD;  Location: [facility];  Service: Orthopedics;  Laterality: Right;  99min    Family History  Problem Relation Age of Onset  . Diabetes Mother   . Rheum arthritis Mother   . Hypertension Mother   . Hypertension Father   . Rheum arthritis Sister   . Hypertension Sister   . Colon cancer Maternal Grandmother   . Rheum arthritis Sister   . Rheum arthritis Sister   . Rheum arthritis Sister   . CAD Sister        MIdx age 41  . Stomach cancer Maternal Uncle   . Breast cancer Neg Hx   . Esophageal cancer Neg Hx   . Rectal cancer Neg Hx     Social History   Socioeconomic History  . Marital status: Married    Spouse name: Not on file  . Number of children: 1  . Years of education: Not on file  . Highest education level: Not on file  Occupational History  . Occupation: retired form the [employer] 04-2015    Employer: [employer]  Tobacco Use  . Smoking status: Never Smoker  . Smokeless tobacco: Never Used  . Tobacco comment: never used tobacco  Vaping Use  . Vaping Use: Never used  Substance and Sexual Activity  . Alcohol use: No     Alcohol/week: 0.0 standard drinks  . Drug use: No  . Sexual activity: Not Currently    Birth control/protection: None  Other Topics Concern  . Not on file  Social History Narrative   [employer] Graduate - Technical school   Married 08/03/68 for 2 years, divorced; re-married 03-Aug-1984 for 2.5 years, divorced; re-married 1988-08-03 for 1 year, divorced; re-married '00   1 son - 08/04/70   Sister - Died @ 36 [place]       Social Determinants of Radio broadcast assistant Strain: Not on file  Food Insecurity: Not on  file  Transportation Needs: Not on file  Physical Activity: Not on file  Stress: Not on file  Social Connections: Not on file  Intimate Partner Violence: Not on file      Review of systems: All other review of systems negative except as mentioned in the HPI.   Physical Exam: Vitals:   04/02/20 0928  BP: (!) 110/58  Pulse: 71   Body mass index is 36.18 kg/m. Gen:      No acute distress HEENT:  sclera anicteric Abd:      soft, non-tender; no palpable masses, no distension Ext:    No edema Neuro: alert and oriented x 3 Psych: normal mood and affect  Data Reviewed:  Reviewed labs, radiology imaging, old records and pertinent past GI work up   Assessment and Plan/Recommendations:  69 year old female with history of chronic GERD, s/p Nissen fundoplication with chronic dysphagia and irritable bowel syndrome  Dysphagia: Evidence of EG junction outflow obstruction, likely secondary to Nissen fundoplication Will plan for repeat EGD with esophageal dilation The risks and benefits as well as alternatives of endoscopic procedure(s) have been discussed and reviewed. All questions answered. The patient agrees to proceed. We will request clearance to hold Xarelto for 2 days prior to the procedure from prescribing MD  Intermittent heartburn likely secondary to stasis in the esophagus rather than GERD DC AcipHex Okay to use Carafate suspension 1 g before meals and at bedtime as needed for  odynophagia  IBS: Use dicyclomine 20 mg every 8 hours as needed Also do a trial of IBgard 1 capsule up to 3 times daily as needed Increase dietary fiber and fluid intake  3 of adenomatous colon polyps, due for recall colonoscopy August 2022   The patient was provided an opportunity to ask questions and all were answered. The patient agreed with the plan and demonstrated an understanding of the instructions.  Damaris Hippo , MD    CC: Colon Branch, MD

## 2020-04-05 ENCOUNTER — Encounter: Payer: Self-pay | Admitting: Gastroenterology

## 2020-04-06 ENCOUNTER — Other Ambulatory Visit: Payer: Self-pay | Admitting: Hematology & Oncology

## 2020-04-06 NOTE — Telephone Encounter (Signed)
[place], Holli Humbles, NP  You 23 hours ago (8:56 AM)    I spoke with [name] and with the patient being on a maintenance dose of Xarelto (5 mg PO daily) we would advise she stop 1 day prior to the procedure and restart the day after. Thank you! Please let us know if you have any other questions or concerns.   Sarah   Message text

## 2020-04-06 NOTE — Telephone Encounter (Signed)
Ok, its fine to hold for 1 day. Thanks

## 2020-04-06 NOTE — Telephone Encounter (Signed)
Notified patient to hold her blood thinner for one day before her procedure and resume taking it the day after.  Patient expressed understanding and agreement.

## 2020-04-06 NOTE — Telephone Encounter (Signed)
Dr. Silverio Decamp please advise, we requested a 2 day clearance but we are only getting a 1 day.  Will this suffice?

## 2020-04-15 ENCOUNTER — Ambulatory Visit (AMBULATORY_SURGERY_CENTER): Payer: Medicare Other | Admitting: Gastroenterology

## 2020-04-15 ENCOUNTER — Other Ambulatory Visit: Payer: Self-pay

## 2020-04-15 ENCOUNTER — Encounter: Payer: Self-pay | Admitting: Gastroenterology

## 2020-04-15 VITALS — BP 149/79 | HR 63 | Temp 97.2°F | Resp 15 | Ht 69.0 in | Wt 245.0 lb

## 2020-04-15 DIAGNOSIS — R1319 Other dysphagia: Secondary | ICD-10-CM

## 2020-04-15 DIAGNOSIS — K2970 Gastritis, unspecified, without bleeding: Secondary | ICD-10-CM | POA: Diagnosis not present

## 2020-04-15 DIAGNOSIS — K222 Esophageal obstruction: Secondary | ICD-10-CM | POA: Diagnosis not present

## 2020-04-15 DIAGNOSIS — K319 Disease of stomach and duodenum, unspecified: Secondary | ICD-10-CM

## 2020-04-15 DIAGNOSIS — R1310 Dysphagia, unspecified: Secondary | ICD-10-CM | POA: Diagnosis not present

## 2020-04-15 DIAGNOSIS — G4733 Obstructive sleep apnea (adult) (pediatric): Secondary | ICD-10-CM | POA: Diagnosis not present

## 2020-04-15 DIAGNOSIS — K2289 Other specified disease of esophagus: Secondary | ICD-10-CM | POA: Diagnosis not present

## 2020-04-15 DIAGNOSIS — K2950 Unspecified chronic gastritis without bleeding: Secondary | ICD-10-CM | POA: Diagnosis not present

## 2020-04-15 DIAGNOSIS — M797 Fibromyalgia: Secondary | ICD-10-CM | POA: Diagnosis not present

## 2020-04-15 DIAGNOSIS — K2990 Gastroduodenitis, unspecified, without bleeding: Secondary | ICD-10-CM

## 2020-04-15 DIAGNOSIS — K589 Irritable bowel syndrome without diarrhea: Secondary | ICD-10-CM | POA: Diagnosis not present

## 2020-04-15 MED ORDER — SODIUM CHLORIDE 0.9 % IV SOLN: 500.0000 mL | Freq: Once | INTRAVENOUS | Status: DC

## 2020-04-15 NOTE — Op Note (Addendum)
[facility] Patient Name: Jennifer [name] Procedure Date: 04/15/2020 1:32 PM MRN: [id number] Endoscopist: Mauri Pole , MD Age: 69 Referring MD:  Date of Birth: Sep 01, 1950 Gender: Female Account #: [id number] Procedure:                Upper GI endoscopy Indications:              Dysphagia Medicines:                Monitored Anesthesia Care Procedure:                Pre-Anesthesia Assessment:                           - Prior to the procedure, a History and Physical                            was performed, and patient medications and                            allergies were reviewed. The patient's tolerance of                            previous anesthesia was also reviewed. The risks                            and benefits of the procedure and the sedation                            options and risks were discussed with the patient.                            All questions were answered, and informed consent                            was obtained. Prior Anticoagulants: The patient                            last took Xarelto (rivaroxaban) 1 day prior to the                            procedure. ASA Grade Assessment: II - A patient                            with mild systemic disease. After reviewing the                            risks and benefits, the patient was deemed in                            satisfactory condition to undergo the procedure.                           After obtaining informed consent, the endoscope was  passed under direct vision. Throughout the                            procedure, the patient's blood pressure, pulse, and                            oxygen saturations were monitored continuously. The                            Endoscope was introduced through the mouth, and                            advanced to the second part of duodenum. The upper                            GI endoscopy was accomplished without  difficulty.                            The patient tolerated the procedure well. Scope In: Scope Out: Findings:                 One moderate stenosis was found 39 to 41 cm from                            the incisors, site of fundoplication. This stenosis                            measured 1.6 cm (inner diameter) x 2 cm (in                            length). The stenosis was traversed. The scope was                            withdrawn. Dilation was attempted, but the lesion                            was not amenable to treatment with a Maloney                            dilator because mild resistance at 39 Fr, unable to                            extend it freely beyond 38cm from incissors. The                            dilation site was examined following endoscope                            reinsertion and showed no change. A TTS dilator was                            passed through the scope. Dilation with an 18-19-20  mm balloon dilator was performed to 20 mm. The                            dilation site was examined following endoscope                            reinsertion and showed no change.                           There were esophageal mucosal changes suspicious                            for short-segment Barrett's esophagus present at                            the gastroesophageal junction. The maximum                            longitudinal extent of these mucosal changes was 3                            cm in length. Mucosa was biopsied with a cold                            forceps for histology in a targeted manner at                            intervals of 1 cm in the lower third of the                            esophagus. One specimen bottle was sent to                            pathology.                           Evidence of a Nissen fundoplication was found at                            the gastroesophageal junction. The wrap  appeared                            intact. This was traversed.                           Patchy mild inflammation characterized by                            congestion (edema) and erythema was found in the                            cardia, in the gastric fundus and in the gastric  body. Biopsies were taken with a cold forceps for                            Helicobacter pylori testing.                           The examined duodenum was normal. Complications:            No immediate complications. Estimated Blood Loss:     Estimated blood loss was minimal. Impression:               - Esophageal stenosis. Unable to dilate. Dilated.                           - Esophageal mucosal changes suspicious for                            short-segment Barrett's esophagus. Biopsied.                           - A Nissen fundoplication was found. The wrap                            appears intact.                           - Gastritis. Biopsied.                           - Normal examined duodenum. Recommendation:           - Patient has a contact number available for                            emergencies. The signs and symptoms of potential                            delayed complications were discussed with the                            patient. Return to normal activities tomorrow.                            Written discharge instructions were provided to the                            patient.                           - Resume previous diet.                           - Continue present medications.                           - Await pathology results.                           -  Return to GI office in 3 months.                           - Resume Xarelto (rivaroxaban) at prior dose                            tomorrow. Refer to managing physician for further                            adjustment of therapy. Mauri Pole, MD 04/15/2020 2:08:42 PM This report has  been signed electronically.

## 2020-04-15 NOTE — Progress Notes (Signed)
Report to PACU, RN, vss, BBS= Clear.  

## 2020-04-15 NOTE — Progress Notes (Signed)
Medical history reviewed with no changes noted. VS assessed by C.W 

## 2020-04-15 NOTE — Patient Instructions (Signed)

## 2020-04-19 ENCOUNTER — Telehealth: Payer: Self-pay

## 2020-04-19 NOTE — Telephone Encounter (Signed)
LVM

## 2020-04-29 ENCOUNTER — Other Ambulatory Visit: Payer: Self-pay

## 2020-04-29 ENCOUNTER — Telehealth: Payer: Self-pay | Admitting: Hematology & Oncology

## 2020-04-29 ENCOUNTER — Inpatient Hospital Stay: Payer: Medicare Other

## 2020-04-29 ENCOUNTER — Encounter: Payer: Self-pay | Admitting: Hematology & Oncology

## 2020-04-29 ENCOUNTER — Inpatient Hospital Stay: Payer: Medicare Other | Attending: Hematology & Oncology | Admitting: Hematology & Oncology

## 2020-04-29 VITALS — BP 136/63 | HR 71 | Temp 97.7°F | Resp 18 | Wt 247.0 lb

## 2020-04-29 DIAGNOSIS — E0801 Diabetes mellitus due to underlying condition with hyperosmolarity with coma: Secondary | ICD-10-CM

## 2020-04-29 DIAGNOSIS — Z7901 Long term (current) use of anticoagulants: Secondary | ICD-10-CM | POA: Diagnosis not present

## 2020-04-29 DIAGNOSIS — Z86711 Personal history of pulmonary embolism: Secondary | ICD-10-CM | POA: Insufficient documentation

## 2020-04-29 DIAGNOSIS — Z79899 Other long term (current) drug therapy: Secondary | ICD-10-CM | POA: Insufficient documentation

## 2020-04-29 DIAGNOSIS — E119 Type 2 diabetes mellitus without complications: Secondary | ICD-10-CM | POA: Insufficient documentation

## 2020-04-29 DIAGNOSIS — I2601 Septic pulmonary embolism with acute cor pulmonale: Secondary | ICD-10-CM

## 2020-04-29 LAB — CBC WITH DIFFERENTIAL (CANCER CENTER ONLY)
Abs Immature Granulocytes: 0.01 10*3/uL (ref 0.00–0.07)
Basophils Absolute: 0 10*3/uL (ref 0.0–0.1)
Basophils Relative: 1 %
Eosinophils Absolute: 0.1 10*3/uL (ref 0.0–0.5)
Eosinophils Relative: 3 %
HCT: 36 % (ref 36.0–46.0)
Hemoglobin: 11.6 g/dL — ABNORMAL LOW (ref 12.0–15.0)
Immature Granulocytes: 0 %
Lymphocytes Relative: 46 %
Lymphs Abs: 1.6 10*3/uL (ref 0.7–4.0)
MCH: 28.2 pg (ref 26.0–34.0)
MCHC: 32.2 g/dL (ref 30.0–36.0)
MCV: 87.6 fL (ref 80.0–100.0)
Monocytes Absolute: 0.5 10*3/uL (ref 0.1–1.0)
Monocytes Relative: 13 %
Neutro Abs: 1.3 10*3/uL — ABNORMAL LOW (ref 1.7–7.7)
Neutrophils Relative %: 37 %
Platelet Count: 250 10*3/uL (ref 150–400)
RBC: 4.11 MIL/uL (ref 3.87–5.11)
RDW: 15.8 % — ABNORMAL HIGH (ref 11.5–15.5)
WBC Count: 3.6 10*3/uL — ABNORMAL LOW (ref 4.0–10.5)
nRBC: 0 % (ref 0.0–0.2)

## 2020-04-29 LAB — HEMOGLOBIN A1C
Hgb A1c MFr Bld: 5.6 % (ref 4.8–5.6)
Mean Plasma Glucose: 114.02 mg/dL

## 2020-04-29 LAB — CMP (CANCER CENTER ONLY)
ALT: 16 U/L (ref 0–44)
AST: 18 U/L (ref 15–41)
Albumin: 4.1 g/dL (ref 3.5–5.0)
Alkaline Phosphatase: 60 U/L (ref 38–126)
Anion gap: 6 (ref 5–15)
BUN: 15 mg/dL (ref 8–23)
CO2: 34 mmol/L — ABNORMAL HIGH (ref 22–32)
Calcium: 9.3 mg/dL (ref 8.9–10.3)
Chloride: 102 mmol/L (ref 98–111)
Creatinine: 0.93 mg/dL (ref 0.44–1.00)
GFR, Estimated: >60 mL/min (ref >60)
Glucose, Bld: 95 mg/dL (ref 70–99)
Potassium: 3.9 mmol/L (ref 3.5–5.1)
Sodium: 142 mmol/L (ref 135–145)
Total Bilirubin: 0.4 mg/dL (ref 0.3–1.2)
Total Protein: 7.2 g/dL (ref 6.5–8.1)

## 2020-04-29 LAB — D-DIMER, QUANTITATIVE: D-Dimer, Quant: 1.37 ug/mL-FEU — ABNORMAL HIGH (ref 0.00–0.50)

## 2020-04-29 NOTE — Telephone Encounter (Signed)
Called and LMVM for patient regarding appointments that have been added per 1/6 los

## 2020-04-29 NOTE — Progress Notes (Signed)
Hematology and Oncology Follow Up Visit  Jennifer [name] [id number] April 03, 1951 70 y.o. 04/29/2020   Principle Diagnosis:  Recurrent pulmonary embolism  Current Therapy:        Xarelto 5 mg po q day - lifelong   Interim History:  Jennifer [name] is here today for follow-up.  Everything is going quite well with her.  She really is having no complaints.  She had her knee surgery for the right knee back in May.  She got through this.  It sounds like she might need knee surgery for the left knee at some point.  She has had no problems with bleeding.  There is been no cough or shortness of breath.  She really has not gone anywhere.  She has been very diligent with the coronavirus.  There is been no problems with nausea or vomiting.  She has had no change in bowel or bladder habits.  I think she has some hemorrhoid problems last year.  She has had no rashes.  She does have diabetes.  We are checking a hemoglobin A1c level on her.  Overall, her performance status is ECOG 1.    Medications:  Allergies as of 04/29/2020      Reactions   Morphine And Related Anaphylaxis, Shortness Of Breath   Sulfonamide Derivatives Hives   Burn from inside out   Promethazine Hcl Other (See Comments)   Hallucinations      Medication List       Accurate as of April 29, 2020  2:12 PM. If you have any questions, ask your nurse or doctor.        acetaminophen 500 MG tablet Commonly known as: TYLENOL Take 1 tablet (500 mg total) by mouth every 6 (six) hours as needed.   Aciphex 20 MG tablet Generic drug: RABEprazole Take 1 tablet by mouth twice daily   cycloSPORINE 0.05 % ophthalmic emulsion Commonly known as: RESTASIS Place 1 drop into both eyes 2 (two) times daily.   Restasis 0.05 % ophthalmic emulsion Generic drug: cycloSPORINE Restasis 0.05 % eye drops in a dropperette  INSTILL 1 DROP INTO EACH EYE TWICE DAILY   dicyclomine 20 MG tablet Commonly known as: BENTYL Take 1 tablet (20 mg total) by  mouth every 8 (eight) hours as needed for spasms.   escitalopram 10 MG tablet Commonly known as: LEXAPRO Take 1 tablet (10 mg total) by mouth daily.   glucose blood test strip Check blood sugar once daily   hydrocortisone valerate cream 0.2 % Commonly known as: WESTCORT Apply 1 application topically daily as needed (eczema). eczema   hyoscyamine 0.125 MG SL tablet Commonly known as: LEVSIN SL DISSOLVE ONE TABLET IN MOUTH EVERY 4 HOURS AS NEEDED   multivitamin with minerals tablet Take 1 tablet by mouth daily. Rainbow light   OneTouch Delica Lancets 33G Misc Check blood sugar once daily   Prevagen 10 MG Caps Generic drug: Apoaequorin Take 10 mg by mouth daily. prevagen   sucralfate 1 g tablet Commonly known as: Carafate Take 1 gram by mouth three times daily before meals PHARMACY-PLEASE HAVE PATIENT MAKE INTO A SLURRY   temazepam 30 MG capsule Commonly known as: RESTORIL TAKE 1 CAPSULE BY MOUTH AT BEDTIME AS NEEDED FOR SLEEP   traMADol 50 MG tablet Commonly known as: ULTRAM Take 1-2 tablets (50-100 mg total) by mouth every 6 (six) hours as needed for moderate pain.   triamterene-hydrochlorothiazide 37.5-25 MG tablet Commonly known as: MAXZIDE-25 Take 0.5 tablets by mouth daily.  Xarelto 2.5 MG Tabs tablet Generic drug: rivaroxaban Take 2 tablets by mouth once daily       Allergies:  Allergies  Allergen Reactions  . Morphine And Related Anaphylaxis and Shortness Of Breath  . Sulfonamide Derivatives Hives    Burn from inside out  . Promethazine Hcl Other (See Comments)    Hallucinations    Past Medical History, Surgical history, Social history, and Family History were reviewed and updated.  Review of Systems: Review of Systems  Constitutional: Negative.   HENT: Negative.   Eyes: Negative.   Respiratory: Negative.   Cardiovascular: Negative.   Gastrointestinal: Negative.   Genitourinary: Negative.   Musculoskeletal: Positive for joint pain.  Skin:  Negative.   Neurological: Negative.   Endo/Heme/Allergies: Negative.   Psychiatric/Behavioral: Negative.      Physical Exam:  weight is 247 lb (112 kg). Her oral temperature is 97.7 F (36.5 C). Her blood pressure is 136/63 and her pulse is 71. Her respiration is 18 and oxygen saturation is 95%.   Wt Readings from Last 3 Encounters:  04/29/20 247 lb (112 kg)  04/15/20 245 lb (111.1 kg)  04/02/20 245 lb (111.1 kg)    Ocular: Sclerae unicteric, pupils equal, round and reactive to light Ear-nose-throat: Oropharynx clear, dentition fair Lymphatic: No cervical or supraclavicular adenopathy Lungs no rales or rhonchi, good excursion bilaterally Heart regular rate and rhythm, no murmur appreciated Abd soft, nontender, positive bowel sounds MSK no focal spinal tenderness, no joint edema Neuro: non-focal, well-oriented, appropriate affect Breasts: Deferred   Lab Results  Component Value Date   WBC 3.6 (L) 04/29/2020   HGB 11.6 (L) 04/29/2020   HCT 36.0 04/29/2020   MCV 87.6 04/29/2020   PLT 250 04/29/2020   Lab Results  Component Value Date   FERRITIN 165 08/13/2017   IRON 60 08/13/2017   TIBC 260 08/13/2017   UIBC 200 08/13/2017   IRONPCTSAT 23 08/13/2017   Lab Results  Component Value Date   RBC 4.11 04/29/2020   No results found for: KPAFRELGTCHN, LAMBDASER, KAPLAMBRATIO No results found for: Kandis Cocking, IGMSERUM No results found for: [name], SPEI   Chemistry      Component Value Date/Time   NA 142 04/29/2020 1327   NA 143 02/16/2017 1450   NA 142 01/19/2016 1141   K 3.9 04/29/2020 1327   K 3.7 02/16/2017 1450   K 3.7 01/19/2016 1141   CL 102 04/29/2020 1327   CL 105 02/16/2017 1450   CO2 34 (H) 04/29/2020 1327   CO2 32 02/16/2017 1450   CO2 27 01/19/2016 1141   BUN 15 04/29/2020 1327   BUN 14 02/16/2017 1450   BUN 19.5 01/19/2016 1141   CREATININE 0.93 04/29/2020 1327   CREATININE 1.2  02/16/2017 1450   CREATININE 0.9 01/19/2016 1141      Component Value Date/Time   CALCIUM 9.3 04/29/2020 1327   CALCIUM 8.8 02/16/2017 1450   CALCIUM 8.8 01/19/2016 1141   ALKPHOS 60 04/29/2020 1327   ALKPHOS 57 02/16/2017 1450   ALKPHOS 59 01/19/2016 1141   AST 18 04/29/2020 1327   AST 21 01/19/2016 1141   ALT 16 04/29/2020 1327   ALT 27 02/16/2017 1450   ALT 18 01/19/2016 1141   BILITOT 0.4 04/29/2020 1327   BILITOT 0.31 01/19/2016 1141       Impression and Plan: Ms. Filbrun is a very pleasant 70 yo African American female with history of recurrent PE.  Her initial thrombus was diagnosed in 2012 and then recurred in 2015.   She is on lifelong anticoagulation with Xarelto 5 mg PO daily and tolerating well.   We will have to see about her and any knee surgery for the left knee.  It sounds like this may not be for quite a while.  Josph Macho, MD 1/6/20222:12 PM

## 2020-04-30 ENCOUNTER — Encounter: Payer: Self-pay | Admitting: *Deleted

## 2020-05-07 ENCOUNTER — Other Ambulatory Visit: Payer: Self-pay

## 2020-05-07 ENCOUNTER — Telehealth: Payer: Self-pay | Admitting: Internal Medicine

## 2020-05-07 MED ORDER — XARELTO 2.5 MG PO TABS: 5.0000 mg | ORAL_TABLET | Freq: Every day | ORAL | 0 refills | Status: DC

## 2020-05-07 MED ORDER — TEMAZEPAM 30 MG PO CAPS: 30.0000 mg | ORAL_CAPSULE | Freq: Every evening | ORAL | 0 refills | Status: DC | PRN

## 2020-05-07 NOTE — Telephone Encounter (Signed)
New pharmacy  Medication: temazepam (RESTORIL) 30 MG capsule [[id number]]      Has the patient contacted their pharmacy?  (If no, request that the patient contact the pharmacy for the refill.) (If yes, when and what did the pharmacy advise?)     Preferred Pharmacy (with phone number or street name):  [facility] # [place], [place] - [address] [place]  [address] [place] [address]  Phone:  [phone] Fax:  [phone]     Agent: Please be advised that RX refills may take up to 3 business days. We ask that you follow-up with your pharmacy.

## 2020-05-07 NOTE — Telephone Encounter (Signed)
Requesting: temazepam 30mg   Contract: 02/11/2020 UDS: 11/17/2019 Last Visit: 02/11/2020 Next Visit: 05/24/2020  Last Refill: 01/05/2020 #30 and 3RF  NEW PHARMACY  Please Advise

## 2020-05-07 NOTE — Telephone Encounter (Signed)
PDMP reviewed, it is slightly early but we will go ahead and refill it

## 2020-05-11 ENCOUNTER — Encounter: Payer: Self-pay | Admitting: Gastroenterology

## 2020-05-23 ENCOUNTER — Encounter: Payer: Self-pay | Admitting: Internal Medicine

## 2020-05-24 ENCOUNTER — Encounter: Payer: Self-pay | Admitting: Internal Medicine

## 2020-05-24 ENCOUNTER — Ambulatory Visit (INDEPENDENT_AMBULATORY_CARE_PROVIDER_SITE_OTHER): Payer: Medicare Other | Admitting: Internal Medicine

## 2020-05-24 ENCOUNTER — Other Ambulatory Visit: Payer: Self-pay

## 2020-05-24 VITALS — BP 146/80 | HR 80 | Temp 98.3°F | Resp 18 | Ht 69.0 in | Wt 250.0 lb

## 2020-05-24 DIAGNOSIS — I10 Essential (primary) hypertension: Secondary | ICD-10-CM

## 2020-05-24 DIAGNOSIS — Z79899 Other long term (current) drug therapy: Secondary | ICD-10-CM | POA: Diagnosis not present

## 2020-05-24 DIAGNOSIS — F419 Anxiety disorder, unspecified: Secondary | ICD-10-CM

## 2020-05-24 DIAGNOSIS — E041 Nontoxic single thyroid nodule: Secondary | ICD-10-CM

## 2020-05-24 DIAGNOSIS — E119 Type 2 diabetes mellitus without complications: Secondary | ICD-10-CM

## 2020-05-24 LAB — MICROALBUMIN / CREATININE URINE RATIO
Creatinine,U: 93.9 mg/dL
Microalb Creat Ratio: 0.7 mg/g (ref 0.0–30.0)
Microalb, Ur: <0.7 mg/dL (ref 0.0–1.9)

## 2020-05-24 NOTE — Progress Notes (Signed)
Subjective:    Patient ID: EMBRIE FIGLER, female    DOB: 1951-02-05, 70 y.o.   MRN: [id number]  DOS:  05/24/2020 Type of visit - description: Routine visit No major concerns Saw hematology, notes and labs reviewed. Ambulatory BP readings reviewed.  Review of Systems See above   Past Medical History:  Diagnosis Date  . Allergy   . Anemia   . Arthritis   . Cataract   . Chronic chest pain   . Chronic fatigue 02/03/2017  . Colon polyps    inflamed partially ulcerated hyperplastic polyp  . Diabetes ([place]) 09/24/2013  . Diverticulosis   . Dysphagia esophageal phase - chronic after 3 fundoplications [id number]  . Esophageal stenosis    Stricture after fundoplication REDO  . Fibromyalgia   . GERD (gastroesophageal reflux disease)   . Heart murmur    no problems   . Hemorrhoids, internal, with bleeding 06/27/2017  . History of hiatal hernia   . Hypertension   . IBS (irritable bowel syndrome)   . Mitral valve prolapse   . OSA (obstructive sleep apnea)    on CPAP-[place] - Sleep study 2004  . Pulmonary embolus ([place])    2010 and 12-2013  . Sleep apnea    wears CPAP  . Thyroid nodule      dr. watching   . Tinnitus    Subjective  . Zoster 2014   R flank    Past Surgical History:  Procedure Laterality Date  . ABDOMINAL HYSTERECTOMY     still has 1 ovary   . APPENDECTOMY    . [place]   Stem surgery, Arnold Chiari Malformation  . BREAST REDUCTION SURGERY    . CATARACT EXTRACTION Bilateral 2017   cataracts, Dr. Herbert Deaner  . CHOLECYSTECTOMY    . ESOPHAGEAL MANOMETRY N/A 08/06/2019   Procedure: ESOPHAGEAL MANOMETRY (EM);  Surgeon: Gatha Mayer, MD;  Location: [facility];  Service: Endoscopy;  Laterality: N/A;  . HEMORRHOID BANDING    . HERNIA REPAIR     Hital Hernia  . KNEE ARTHROSCOPY  05/16/2012   Procedure: ARTHROSCOPY KNEE;  Surgeon: Sharmon Revere, MD;  Location: [place];  Service: Orthopedics;  Laterality: Left;  . KNEE ARTHROSCOPY Right 01/2018  . [place]  FUNDOPLICATION     X 3 lab, open x 2 last with mesh  . REDUCTION MAMMAPLASTY Bilateral 2007  . TOTAL KNEE ARTHROPLASTY Right 08/25/2019   Procedure: RIGHT TOTAL KNEE ARTHROPLASTY;  Surgeon: Gaynelle Arabian, MD;  Location: [facility];  Service: Orthopedics;  Laterality: Right;  25min    Allergies as of 05/24/2020      Reactions   Morphine And Related Anaphylaxis, Shortness Of Breath   Sulfonamide Derivatives Hives   Burn from inside out   Promethazine Hcl Other (See Comments)   Hallucinations      Medication List       Accurate as of May 24, 2020 11:59 PM. If you have any questions, ask your nurse or doctor.        STOP taking these medications   traMADol 50 MG tablet Commonly known as: ULTRAM Stopped by: Kathlene November, MD     TAKE these medications   acetaminophen 500 MG tablet Commonly known as: TYLENOL Take 1 tablet (500 mg total) by mouth every 6 (six) hours as needed.   Aciphex 20 MG tablet Generic drug: RABEprazole Take 1 tablet by mouth twice daily   dicyclomine 20 MG tablet Commonly known as: BENTYL Take 1 tablet (  20 mg total) by mouth every 8 (eight) hours as needed for spasms.   escitalopram 10 MG tablet Commonly known as: LEXAPRO Take 1 tablet (10 mg total) by mouth daily.   glucose blood test strip Check blood sugar once daily   hydrocortisone valerate cream 0.2 % Commonly known as: WESTCORT Apply 1 application topically daily as needed (eczema). eczema   hyoscyamine 0.125 MG SL tablet Commonly known as: LEVSIN SL DISSOLVE ONE TABLET IN MOUTH EVERY 4 HOURS AS NEEDED   multivitamin with minerals tablet Take 1 tablet by mouth daily. Rainbow light   OneTouch Delica Lancets 95A Misc Check blood sugar once daily   Prevagen 10 MG Caps Generic drug: Apoaequorin Take 10 mg by mouth daily. prevagen   Restasis 0.05 % ophthalmic emulsion Generic drug: cycloSPORINE Restasis 0.05 % eye drops in a dropperette  INSTILL 1 DROP INTO EACH EYE TWICE DAILY What  changed: Another medication with the same name was removed. Continue taking this medication, and follow the directions you see here. Changed by: Kathlene November, MD   sucralfate 1 g tablet Commonly known as: Carafate Take 1 gram by mouth three times daily before meals PHARMACY-PLEASE HAVE PATIENT MAKE INTO A SLURRY   temazepam 30 MG capsule Commonly known as: RESTORIL Take 1 capsule (30 mg total) by mouth at bedtime as needed for sleep.   triamterene-hydrochlorothiazide 37.5-25 MG tablet Commonly known as: MAXZIDE-25 Take 0.5 tablets by mouth daily.   Xarelto 2.5 MG Tabs tablet Generic drug: rivaroxaban Take 2 tablets (5 mg total) by mouth daily.          Objective:   Physical Exam BP (!) 146/80 (BP Location: Left Arm, Patient Position: Sitting, Cuff Size: Normal)   Pulse 80   Temp 98.3 F (36.8 C) (Oral)   Resp 18   Ht 5\' 9"  (1.753 m)   Wt 250 lb (113.4 kg)   SpO2 96%   BMI 36.92 kg/m  General:   Well developed, NAD, BMI noted. HEENT:  Normocephalic . Face symmetric, atraumatic Lungs:  CTA B Normal respiratory effort, no intercostal retractions, no accessory muscle use. Heart: RRR,  no murmur.  Lower extremities: no pretibial edema bilaterally Unable to do a formal diabetic foot exam due to patient wearing compression stockings. Skin: Not pale. Not jaundice Neurologic:  alert & oriented X3.  Speech normal, gait appropriate for age and unassisted Psych--  Cognition and judgment appear intact.  Cooperative with normal attention span and concentration.  Behavior appropriate. No anxious or depressed appearing.      Assessment     Assessment   DM, + neuropathy  HTN Anxiety, insomnia: On restoril prn, lexapro, xanax for flying only Thyroid nodule: Bx (non neoplastic goiter) 2014,  [place] 05-2016, next 2,3 years Pulmonary: --OSA, sleep study 2004, on a CPAP --Pulmonary emboli: Lifelong anticoagulation PEs:2010 and 12-2013 - saw hematology 12-2014, rec anticoag x 2 years  (until 12-2015), then continue with a low-dose xarelto   life   -- multiple pulmonary nodule, last CT 11-2013: No further imaging suggested --RAD, inhalers rarely Fibromyalgia CV:  MVP,  carotid US 1-39%s GI: --GERD,chronic dysphagia s/p  fundoplication 3, esophageal stenosis- - April 2021: Barium study, esophageal manometry (see report) -- IBS --Hemorrhoids: S/p banding ~/2019 -- chronic right flank,RUQ pain : since GB surgery 2008 Etiology not clear but likely multifactorial --> Adhesions from the surgery, neuropathy (DM), postherpetic neuralgia, scarring from PE, etc.  MRI T spine done 06-2014: DJD  Zoster --  2014 right flank  PLAN DM: A1c typically 6.0 or less.  Last A1c 5.6.  Check a micro and FLP.  Unable to the foot exam today. HTN: BP slightly elevated today, at home consistently 120/70,Continue Maxide. Anxiety, insomnia: On Restoril as needed, Lexapro and rarely Xanax (although it is not on his medication list today).  Check UDS. Thyroid nodules: Rx thyroid US Pulmonary emboli, lifelong anticoagulation: Recently saw hematology, apixaban decreased to 2.5 mg daily Preventive care: Due for colonoscopy 11/2020, patient aware. Had all Covid vaccinations and a flu shot RTC 6 months   This visit occurred during the SARS-CoV-2 public health emergency.  Safety protocols were in place, including screening questions prior to the visit, additional usage of staff PPE, and extensive cleaning of exam room while observing appropriate contact time as indicated for disinfecting solutions.

## 2020-05-24 NOTE — Progress Notes (Signed)
Pre visit review using our clinic review tool, if applicable. No additional management support is needed unless otherwise documented below in the visit note. 

## 2020-05-24 NOTE — Patient Instructions (Addendum)
Per our records you are due for an eye exam. Please contact your eye doctor to schedule an appointment. Please have them send copies of your office visit notes to [place]. Our fax number is [phone].  Continue checking your blood pressures BP GOAL is between 110/65 and  135/85. If it is consistently higher or lower, let me know  You are due for a colonoscopy later this year   GO TO THE LAB : Get the blood work     [place], [place] back for   a checkup in 6 months

## 2020-05-25 ENCOUNTER — Ambulatory Visit (HOSPITAL_BASED_OUTPATIENT_CLINIC_OR_DEPARTMENT_OTHER)
Admission: RE | Admit: 2020-05-25 | Discharge: 2020-05-25 | Disposition: A | Discharge status: Home / Self Care | Payer: Medicare Other | Source: Ambulatory Visit | Attending: Internal Medicine | Admitting: Internal Medicine

## 2020-05-25 DIAGNOSIS — E041 Nontoxic single thyroid nodule: Secondary | ICD-10-CM | POA: Insufficient documentation

## 2020-05-25 LAB — LIPID PANEL
Cholesterol: 179 mg/dL (ref 0–200)
HDL: 76.8 mg/dL (ref >39.00)
LDL Cholesterol: 90 mg/dL (ref 0–99)
NonHDL: 102.35
Total CHOL/HDL Ratio: 2
Triglycerides: 60 mg/dL (ref 0.0–149.0)
VLDL: 12 mg/dL (ref 0.0–40.0)

## 2020-05-25 NOTE — Assessment & Plan Note (Signed)
DM: A1c typically 6.0 or less.  Last A1c 5.6.  Check a micro and FLP.  Unable to the foot exam today. HTN: BP slightly elevated today, at home consistently 120/70,Continue Maxide. Anxiety, insomnia: On Restoril as needed, Lexapro and rarely Xanax (although it is not on his medication list today).  Check UDS. Thyroid nodules: Rx thyroid US Pulmonary emboli, lifelong anticoagulation: Recently saw hematology, apixaban decreased to 2.5 mg daily Preventive care: Due for colonoscopy 11/2020, patient aware. Had all Covid vaccinations and a flu shot RTC 6 months

## 2020-05-26 LAB — DRUG MONITORING, PANEL 8 WITH CONFIRMATION, URINE
6 Acetylmorphine: NEGATIVE ng/mL (ref <10)
Alcohol Metabolites: NEGATIVE ng/mL
Alphahydroxyalprazolam: NEGATIVE ng/mL (ref <25)
Alphahydroxymidazolam: NEGATIVE ng/mL (ref <50)
Alphahydroxytriazolam: NEGATIVE ng/mL (ref <50)
Aminoclonazepam: NEGATIVE ng/mL (ref <25)
Amphetamines: NEGATIVE ng/mL (ref <500)
Benzodiazepines: POSITIVE ng/mL — AB (ref <100)
Buprenorphine, Urine: NEGATIVE ng/mL (ref <5)
Cocaine Metabolite: NEGATIVE ng/mL (ref <150)
Creatinine: 96.3 mg/dL
Hydroxyethylflurazepam: NEGATIVE ng/mL (ref <50)
Lorazepam: NEGATIVE ng/mL (ref <50)
MDMA: NEGATIVE ng/mL (ref <500)
Marijuana Metabolite: NEGATIVE ng/mL (ref <20)
Nordiazepam: NEGATIVE ng/mL (ref <50)
Opiates: NEGATIVE ng/mL (ref <100)
Oxazepam: 1528 ng/mL — ABNORMAL HIGH (ref <50)
Oxidant: NEGATIVE ug/mL
Oxycodone: NEGATIVE ng/mL (ref <100)
Temazepam: >8000 ng/mL — ABNORMAL HIGH (ref <50)
pH: 7.9 (ref 4.5–9.0)

## 2020-05-26 LAB — DM TEMPLATE

## 2020-06-07 ENCOUNTER — Telehealth: Payer: Self-pay | Admitting: Internal Medicine

## 2020-06-07 ENCOUNTER — Other Ambulatory Visit: Payer: Self-pay | Admitting: Hematology & Oncology

## 2020-06-08 ENCOUNTER — Telehealth: Payer: Self-pay

## 2020-06-08 NOTE — Telephone Encounter (Signed)
PDMP okay, Rx sent 

## 2020-06-08 NOTE — Telephone Encounter (Signed)
I have started a prior authorization for patient's aciphex 20mg  tablets thru [employer] for patients GERD K21.9. Will await the outcome.

## 2020-06-08 NOTE — Telephone Encounter (Signed)
Requesting: temazepam 30mg  Contract: 02/11/2020 UDS: 05/24/2020 Last Visit: 05/24/2020 Next Visit: 11/22/2020 Last Refill: 05/07/2020 #30 and 0RF  Please Advise

## 2020-06-10 NOTE — Telephone Encounter (Signed)
Still being processed according to [place] website.

## 2020-06-15 NOTE — Telephone Encounter (Signed)
The rabeprazole 20mg  tablets have been approved from 05/11/20-06/10/2021. [facility] pharmacy informed.

## 2020-06-24 DIAGNOSIS — H26493 Other secondary cataract, bilateral: Secondary | ICD-10-CM | POA: Diagnosis not present

## 2020-06-24 DIAGNOSIS — H04123 Dry eye syndrome of bilateral lacrimal glands: Secondary | ICD-10-CM | POA: Diagnosis not present

## 2020-06-24 DIAGNOSIS — E119 Type 2 diabetes mellitus without complications: Secondary | ICD-10-CM | POA: Diagnosis not present

## 2020-06-24 DIAGNOSIS — H40023 Open angle with borderline findings, high risk, bilateral: Secondary | ICD-10-CM | POA: Diagnosis not present

## 2020-06-24 LAB — HM DIABETES EYE EXAM

## 2020-06-25 ENCOUNTER — Encounter: Payer: Self-pay | Admitting: Internal Medicine

## 2020-06-25 DIAGNOSIS — H40009 Preglaucoma, unspecified, unspecified eye: Secondary | ICD-10-CM | POA: Insufficient documentation

## 2020-06-28 ENCOUNTER — Telehealth: Payer: Self-pay

## 2020-06-28 NOTE — Telephone Encounter (Signed)
I have started a prior authorization thru [place] for patients brand name Aciphex for her GERD K21.9. Will await a response.

## 2020-06-30 NOTE — Telephone Encounter (Signed)
Still pending approval per [employer].

## 2020-07-06 NOTE — Telephone Encounter (Signed)
We received a fax saying that the rabeprazole did not need a prior authorization. They do not cover the brand name which is what I ask for. I have left her a message to call me back.

## 2020-07-07 NOTE — Telephone Encounter (Signed)
Patient returned your call, please call patient one more time.   

## 2020-07-07 NOTE — Telephone Encounter (Signed)
I spoke with Jennifer [name] and she said she CANNOT take the generic Aciphex. It makes her physically gag and nauseated. I told her I had asked for brand name Aciphex. I will call [employer] and talk with customer service.

## 2020-07-07 NOTE — Telephone Encounter (Signed)
I have spoken to [employer] and they are faxing over a form to fill out to try and get her the [facility] name Aciphex. [facility] is aware we are working on this. Form filled out and placed on Dr Celesta Aver desk to be signed.

## 2020-07-08 ENCOUNTER — Other Ambulatory Visit: Payer: Self-pay | Admitting: Hematology & Oncology

## 2020-07-08 NOTE — Telephone Encounter (Signed)
Form has been signed and faxed today, will await outcome.

## 2020-07-09 NOTE — Telephone Encounter (Signed)
I have filled out another form and faxed to try and get her Aciphex approved.

## 2020-07-14 NOTE — Telephone Encounter (Signed)
We got approval for her brand name Aciphex, good from 07/12/20-07/12/2021. [facility] informed and also left a detailed message on patient's voicemail with this information.

## 2020-07-22 DIAGNOSIS — Z96651 Presence of right artificial knee joint: Secondary | ICD-10-CM | POA: Diagnosis not present

## 2020-07-22 DIAGNOSIS — M25562 Pain in left knee: Secondary | ICD-10-CM | POA: Diagnosis not present

## 2020-08-12 ENCOUNTER — Other Ambulatory Visit: Payer: Self-pay | Admitting: Internal Medicine

## 2020-08-12 ENCOUNTER — Other Ambulatory Visit: Payer: Self-pay | Admitting: Hematology & Oncology

## 2020-08-27 ENCOUNTER — Inpatient Hospital Stay (HOSPITAL_BASED_OUTPATIENT_CLINIC_OR_DEPARTMENT_OTHER): Payer: Medicare Other | Admitting: Hematology & Oncology

## 2020-08-27 ENCOUNTER — Encounter: Payer: Self-pay | Admitting: Hematology & Oncology

## 2020-08-27 ENCOUNTER — Other Ambulatory Visit: Payer: Self-pay

## 2020-08-27 ENCOUNTER — Inpatient Hospital Stay: Payer: Medicare Other | Attending: Hematology & Oncology

## 2020-08-27 VITALS — BP 143/75 | HR 80 | Temp 98.6°F | Resp 18 | Wt 258.0 lb

## 2020-08-27 DIAGNOSIS — E083513 Diabetes mellitus due to underlying condition with proliferative diabetic retinopathy with macular edema, bilateral: Secondary | ICD-10-CM | POA: Diagnosis not present

## 2020-08-27 DIAGNOSIS — Z794 Long term (current) use of insulin: Secondary | ICD-10-CM | POA: Diagnosis not present

## 2020-08-27 DIAGNOSIS — R0600 Dyspnea, unspecified: Secondary | ICD-10-CM | POA: Diagnosis not present

## 2020-08-27 DIAGNOSIS — Z7901 Long term (current) use of anticoagulants: Secondary | ICD-10-CM | POA: Diagnosis not present

## 2020-08-27 DIAGNOSIS — I2601 Septic pulmonary embolism with acute cor pulmonale: Secondary | ICD-10-CM

## 2020-08-27 DIAGNOSIS — Z86711 Personal history of pulmonary embolism: Secondary | ICD-10-CM | POA: Insufficient documentation

## 2020-08-27 DIAGNOSIS — Z79899 Other long term (current) drug therapy: Secondary | ICD-10-CM | POA: Insufficient documentation

## 2020-08-27 DIAGNOSIS — E119 Type 2 diabetes mellitus without complications: Secondary | ICD-10-CM | POA: Diagnosis not present

## 2020-08-27 DIAGNOSIS — R0609 Other forms of dyspnea: Secondary | ICD-10-CM

## 2020-08-27 DIAGNOSIS — E0801 Diabetes mellitus due to underlying condition with hyperosmolarity with coma: Secondary | ICD-10-CM

## 2020-08-27 LAB — CMP (CANCER CENTER ONLY)
ALT: 18 U/L (ref 0–44)
AST: 19 U/L (ref 15–41)
Albumin: 4 g/dL (ref 3.5–5.0)
Alkaline Phosphatase: 59 U/L (ref 38–126)
Anion gap: 5 (ref 5–15)
BUN: 21 mg/dL (ref 8–23)
CO2: 33 mmol/L — ABNORMAL HIGH (ref 22–32)
Calcium: 9.6 mg/dL (ref 8.9–10.3)
Chloride: 102 mmol/L (ref 98–111)
Creatinine: 0.98 mg/dL (ref 0.44–1.00)
GFR, Estimated: >60 mL/min (ref >60)
Glucose, Bld: 99 mg/dL (ref 70–99)
Potassium: 4 mmol/L (ref 3.5–5.1)
Sodium: 140 mmol/L (ref 135–145)
Total Bilirubin: 0.3 mg/dL (ref 0.3–1.2)
Total Protein: 7.2 g/dL (ref 6.5–8.1)

## 2020-08-27 LAB — CBC WITH DIFFERENTIAL (CANCER CENTER ONLY)
Abs Immature Granulocytes: 0.02 10*3/uL (ref 0.00–0.07)
Basophils Absolute: 0 10*3/uL (ref 0.0–0.1)
Basophils Relative: 1 %
Eosinophils Absolute: 0.1 10*3/uL (ref 0.0–0.5)
Eosinophils Relative: 3 %
HCT: 36.7 % (ref 36.0–46.0)
Hemoglobin: 12 g/dL (ref 12.0–15.0)
Immature Granulocytes: 0 %
Lymphocytes Relative: 35 %
Lymphs Abs: 1.6 10*3/uL (ref 0.7–4.0)
MCH: 28.6 pg (ref 26.0–34.0)
MCHC: 32.7 g/dL (ref 30.0–36.0)
MCV: 87.4 fL (ref 80.0–100.0)
Monocytes Absolute: 0.6 10*3/uL (ref 0.1–1.0)
Monocytes Relative: 12 %
Neutro Abs: 2.3 10*3/uL (ref 1.7–7.7)
Neutrophils Relative %: 49 %
Platelet Count: 233 10*3/uL (ref 150–400)
RBC: 4.2 MIL/uL (ref 3.87–5.11)
RDW: 15.5 % (ref 11.5–15.5)
WBC Count: 4.7 10*3/uL (ref 4.0–10.5)
nRBC: 0 % (ref 0.0–0.2)

## 2020-08-27 LAB — HEMOGLOBIN A1C
Hgb A1c MFr Bld: 5.9 % — ABNORMAL HIGH (ref 4.8–5.6)
Mean Plasma Glucose: 122.63 mg/dL

## 2020-08-27 LAB — LACTATE DEHYDROGENASE: LDH: 167 U/L (ref 98–192)

## 2020-08-27 NOTE — Progress Notes (Signed)
Hematology and Oncology Follow Up Visit  Jennifer [name] [id number] 31-Jul-1950 70 y.o. 08/27/2020   Principle Diagnosis:  Recurrent pulmonary embolism  Current Therapy:        Xarelto 5 mg po q day - lifelong   Interim History:  Jennifer [name] is here today for follow-up.  We last saw her back in January.  Since then, she has been doing pretty well.  She her right knee is doing quite nicely.  She had surgery for this about a year ago.  She does not want to have surgery for the left knee right now.  She is gaining weight.  She wants to try to lose weight.  Hopefully she will be able to lose a little bit of weight.  This will help her diabetes.  She has had no problems with bleeding.  There is no change in bowel or bladder habits.  She goes for a colonoscopy I think in August.  She has had no fever.  There has been no issues with the COVID.  She still is quite worried about COVID and still does not go out a lot.  Currently, her performance status is ECOG 1.      Medications:  Allergies as of 08/27/2020      Reactions   Morphine And Related Anaphylaxis, Shortness Of Breath   Sulfonamide Derivatives Hives   Burn from inside out   Promethazine Hcl Other (See Comments)   Hallucinations      Medication List       Accurate as of Aug 27, 2020  1:40 PM. If you have any questions, ask your nurse or doctor.        acetaminophen 500 MG tablet Commonly known as: TYLENOL Take 1 tablet (500 mg total) by mouth every 6 (six) hours as needed.   Aciphex 20 MG tablet Generic drug: RABEprazole TAKE ONE TABLET BY MOUTH TWICE DAILY   dicyclomine 20 MG tablet Commonly known as: BENTYL Take 1 tablet (20 mg total) by mouth every 8 (eight) hours as needed for spasms.   escitalopram 10 MG tablet Commonly known as: LEXAPRO Take 1 tablet (10 mg total) by mouth daily.   glucose blood test strip Check blood sugar once daily   hydrocortisone valerate cream 0.2 % Commonly known as: WESTCORT Apply 1  application topically daily as needed (eczema). eczema   hyoscyamine 0.125 MG SL tablet Commonly known as: LEVSIN SL DISSOLVE ONE TABLET IN MOUTH EVERY 4 HOURS AS NEEDED   multivitamin with minerals tablet Take 1 tablet by mouth daily. Rainbow light   OneTouch Delica Lancets 89V Misc Check blood sugar once daily   Prevagen 10 MG Caps Generic drug: Apoaequorin Take 10 mg by mouth daily. prevagen   Restasis 0.05 % ophthalmic emulsion Generic drug: cycloSPORINE Restasis 0.05 % eye drops in a dropperette  INSTILL 1 DROP INTO EACH EYE TWICE DAILY   sucralfate 1 g tablet Commonly known as: Carafate Take 1 gram by mouth three times daily before meals PHARMACY-PLEASE HAVE PATIENT MAKE INTO A SLURRY   temazepam 30 MG capsule Commonly known as: RESTORIL TAKE ONE CAPSULE BY MOUTH AT BEDTIME AS NEEDED FOR SLEEP   triamterene-hydrochlorothiazide 37.5-25 MG tablet Commonly known as: MAXZIDE-25 Take 0.5 tablets by mouth daily.   Xarelto 2.5 MG Tabs tablet Generic drug: rivaroxaban TAKE TWO TABLETS BY MOUTH DAILY       Allergies:  Allergies  Allergen Reactions  . Morphine And Related Anaphylaxis and Shortness Of Breath  . Sulfonamide Derivatives  Hives    Burn from inside out  . Promethazine Hcl Other (See Comments)    Hallucinations    Past Medical History, Surgical history, Social history, and Family History were reviewed and updated.  Review of Systems: Review of Systems  Constitutional: Negative.   HENT: Negative.   Eyes: Negative.   Respiratory: Negative.   Cardiovascular: Negative.   Gastrointestinal: Negative.   Genitourinary: Negative.   Musculoskeletal: Positive for joint pain.  Skin: Negative.   Neurological: Negative.   Endo/Heme/Allergies: Negative.   Psychiatric/Behavioral: Negative.      Physical Exam:  weight is 258 lb (117 kg). Her oral temperature is 98.6 F (37 C). Her blood pressure is 143/75 (abnormal) and her pulse is 80. Her respiration is  18 and oxygen saturation is 99%.   Wt Readings from Last 3 Encounters:  08/27/20 258 lb (117 kg)  05/24/20 250 lb (113.4 kg)  04/29/20 247 lb (112 kg)    Ocular: Sclerae unicteric, pupils equal, round and reactive to light Ear-nose-throat: Oropharynx clear, dentition fair Lymphatic: No cervical or supraclavicular adenopathy Lungs no rales or rhonchi, good excursion bilaterally Heart regular rate and rhythm, no murmur appreciated Abd soft, nontender, positive bowel sounds MSK no focal spinal tenderness, no joint edema Neuro: non-focal, well-oriented, appropriate affect Breasts: Deferred   Lab Results  Component Value Date   WBC 4.7 08/27/2020   HGB 12.0 08/27/2020   HCT 36.7 08/27/2020   MCV 87.4 08/27/2020   PLT 233 08/27/2020   Lab Results  Component Value Date   FERRITIN 165 08/13/2017   IRON 60 08/13/2017   TIBC 260 08/13/2017   UIBC 200 08/13/2017   IRONPCTSAT 23 08/13/2017   Lab Results  Component Value Date   RBC 4.20 08/27/2020   No results found for: KPAFRELGTCHN, LAMBDASER, KAPLAMBRATIO No results found for: Kandis Cocking, IGMSERUM No results found for: [name], SPEI   Chemistry      Component Value Date/Time   NA 140 08/27/2020 1304   NA 143 02/16/2017 1450   NA 142 01/19/2016 1141   K 4.0 08/27/2020 1304   K 3.7 02/16/2017 1450   K 3.7 01/19/2016 1141   CL 102 08/27/2020 1304   CL 105 02/16/2017 1450   CO2 33 (H) 08/27/2020 1304   CO2 32 02/16/2017 1450   CO2 27 01/19/2016 1141   BUN 21 08/27/2020 1304   BUN 14 02/16/2017 1450   BUN 19.5 01/19/2016 1141   CREATININE 0.98 08/27/2020 1304   CREATININE 1.2 02/16/2017 1450   CREATININE 0.9 01/19/2016 1141      Component Value Date/Time   CALCIUM 9.6 08/27/2020 1304   CALCIUM 8.8 02/16/2017 1450   CALCIUM 8.8 01/19/2016 1141   ALKPHOS 59 08/27/2020 1304   ALKPHOS 57 02/16/2017 1450   ALKPHOS 59 01/19/2016 1141   AST 19 08/27/2020 1304    AST 21 01/19/2016 1141   ALT 18 08/27/2020 1304   ALT 27 02/16/2017 1450   ALT 18 01/19/2016 1141   BILITOT 0.3 08/27/2020 1304   BILITOT 0.31 01/19/2016 1141       Impression and Plan: Jennifer [name] is a very pleasant 70 yo African American female with history of recurrent PE.   Her initial thrombus was diagnosed in 2012 and then recurred in 2015.   She is on lifelong anticoagulation with Xarelto 5 mg PO daily and tolerating well.   I will now get her through the summertime.  We will get  her back in September.  [name], MD 5/6/20221:40 PM

## 2020-08-31 ENCOUNTER — Other Ambulatory Visit: Payer: Self-pay | Admitting: Internal Medicine

## 2020-09-03 ENCOUNTER — Other Ambulatory Visit: Payer: Self-pay

## 2020-09-03 MED ORDER — HYOSCYAMINE SULFATE 0.125 MG SL SUBL: SUBLINGUAL_TABLET | SUBLINGUAL | 3 refills | Status: DC

## 2020-09-03 NOTE — Telephone Encounter (Signed)
Hyoscyamine refilled as pharmacy requested.

## 2020-09-09 ENCOUNTER — Other Ambulatory Visit: Payer: Self-pay | Admitting: Hematology & Oncology

## 2020-09-16 DIAGNOSIS — Z96651 Presence of right artificial knee joint: Secondary | ICD-10-CM | POA: Diagnosis not present

## 2020-09-16 DIAGNOSIS — M1712 Unilateral primary osteoarthritis, left knee: Secondary | ICD-10-CM | POA: Diagnosis not present

## 2020-10-09 ENCOUNTER — Other Ambulatory Visit: Payer: Self-pay | Admitting: Internal Medicine

## 2020-11-10 ENCOUNTER — Telehealth: Payer: Self-pay | Admitting: Internal Medicine

## 2020-11-10 NOTE — Telephone Encounter (Signed)
PDMP okay, prescription sent 

## 2020-11-10 NOTE — Telephone Encounter (Signed)
Requesting: temazepam 30mg  Contract: 02/11/2020 UDS: 05/24/2020 Last Visit: 05/24/2020 Next Visit: 11/22/2020 Last Refill: 06/08/2020 #30 and 4RF Pt sig: 1 cap qhs prn  Please Advise

## 2020-11-22 ENCOUNTER — Encounter: Payer: Self-pay | Admitting: Internal Medicine

## 2020-11-22 ENCOUNTER — Other Ambulatory Visit: Payer: Self-pay

## 2020-11-22 ENCOUNTER — Ambulatory Visit (INDEPENDENT_AMBULATORY_CARE_PROVIDER_SITE_OTHER): Payer: Medicare Other | Admitting: Internal Medicine

## 2020-11-22 VITALS — BP 132/70 | HR 82 | Temp 98.0°F | Resp 18 | Ht 69.0 in | Wt 256.2 lb

## 2020-11-22 DIAGNOSIS — I10 Essential (primary) hypertension: Secondary | ICD-10-CM

## 2020-11-22 DIAGNOSIS — R55 Syncope and collapse: Secondary | ICD-10-CM

## 2020-11-22 DIAGNOSIS — E119 Type 2 diabetes mellitus without complications: Secondary | ICD-10-CM

## 2020-11-22 NOTE — Progress Notes (Signed)
Subjective:    Patient ID: Jennifer [name], female    DOB: Feb 16, 1951, 70 y.o.   MRN: [id number]  DOS:  11/22/2020 Type of visit - description: f/u  In general feels well. Recently is again experiencing episodes of lightheadedness related to laughing. Denies LOC but she feels lightheaded for 5 to 10 minutes, she has to sit down and then she gets better. Symptoms not associated with bending or exercise.  Review of Systems Denies chest pain or difficulty breathing. No palpitations  Past Medical History:  Diagnosis Date   Allergy    Anemia    Arthritis    Cataract    Chronic chest pain    Chronic fatigue 02/03/2017   Colon polyps    inflamed partially ulcerated hyperplastic polyp   Diabetes ([place]) 09/24/2013   Diverticulosis    Dysphagia esophageal phase - chronic after 3 fundoplications [id number]   Esophageal stenosis    Stricture after fundoplication REDO   Fibromyalgia    GERD (gastroesophageal reflux disease)    Glaucoma suspect    Heart murmur    no problems    Hemorrhoids, internal, with bleeding 06/27/2017   History of hiatal hernia    Hypertension    IBS (irritable bowel syndrome)    Mitral valve prolapse    OSA (obstructive sleep apnea)    on CPAP-Mild - Sleep study 2004   Pulmonary embolus ([place])    2010 and 12-2013   Sleep apnea    wears CPAP   Thyroid nodule      dr. watching    Tinnitus    Subjective   Zoster 2014   R flank    Past Surgical History:  Procedure Laterality Date   ABDOMINAL HYSTERECTOMY     still has 1 ovary    APPENDECTOMY     BRAIN SURGERY  1995   Stem surgery, Arnold Chiari Malformation   BREAST REDUCTION SURGERY     CATARACT EXTRACTION Bilateral 2017   cataracts, Dr. Herbert Deaner   CHOLECYSTECTOMY     ESOPHAGEAL MANOMETRY N/A 08/06/2019   Procedure: ESOPHAGEAL MANOMETRY (EM);  Surgeon: Gatha Mayer, MD;  Location: [facility];  Service: Endoscopy;  Laterality: N/A;   HEMORRHOID BANDING     HERNIA REPAIR     Hital Hernia   KNEE  ARTHROSCOPY  05/16/2012   Procedure: ARTHROSCOPY KNEE;  Surgeon: Sharmon Revere, MD;  Location: [place];  Service: Orthopedics;  Laterality: Left;   KNEE ARTHROSCOPY Right [id number]   NISSEN FUNDOPLICATION     X 3 lab, open x 2 last with mesh   REDUCTION MAMMAPLASTY Bilateral 2007   TOTAL KNEE ARTHROPLASTY Right 08/25/2019   Procedure: RIGHT TOTAL KNEE ARTHROPLASTY;  Surgeon: Gaynelle Arabian, MD;  Location: [facility];  Service: Orthopedics;  Laterality: Right;  54mn    Allergies as of 11/22/2020       Reactions   Morphine And Related Anaphylaxis, Shortness Of Breath   Sulfonamide Derivatives Hives   Burn from inside out   Promethazine Hcl Other (See Comments)   Hallucinations        Medication List        Accurate as of November 22, 2020 11:59 PM. If you have any questions, ask your nurse or doctor.          acetaminophen 500 MG tablet Commonly known as: TYLENOL Take 1 tablet (500 mg total) by mouth every 6 (six) hours as needed.   Aciphex 20 MG tablet Generic drug: RABEprazole TAKE ONE TABLET  BY MOUTH TWICE DAILY   dicyclomine 20 MG tablet Commonly known as: BENTYL Take 1 tablet (20 mg total) by mouth every 8 (eight) hours as needed for spasms.   escitalopram 10 MG tablet Commonly known as: LEXAPRO TAKE ONE TABLET BY MOUTH ONE TIME DAILY   glucose blood test strip Check blood sugar once daily   hydrocortisone valerate cream 0.2 % Commonly known as: WESTCORT Apply 1 application topically daily as needed (eczema). eczema   hyoscyamine 0.125 MG SL tablet Commonly known as: LEVSIN SL DISSOLVE ONE TABLET IN MOUTH EVERY 4 HOURS AS NEEDED   multivitamin with minerals tablet Take 1 tablet by mouth daily. Rainbow light   OneTouch Delica Lancets 99991111 Misc Check blood sugar once daily   Prevagen 10 MG Caps Generic drug: Apoaequorin Take 10 mg by mouth daily. prevagen   Restasis 0.05 % ophthalmic emulsion Generic drug: cycloSPORINE Restasis 0.05 % eye drops in a  dropperette  INSTILL 1 DROP INTO EACH EYE TWICE DAILY   sucralfate 1 g tablet Commonly known as: Carafate Take 1 gram by mouth three times daily before meals PHARMACY-PLEASE HAVE PATIENT MAKE INTO A SLURRY   temazepam 30 MG capsule Commonly known as: RESTORIL TAKE ONE CAPSULE BY MOUTH AT BEDTIME AS NEEDED FOR SLEEP   triamterene-hydrochlorothiazide 37.5-25 MG tablet Commonly known as: MAXZIDE-25 Take 0.5 tablets by mouth daily.   Xarelto 2.5 MG Tabs tablet Generic drug: rivaroxaban TAKE TWO TABLETS BY MOUTH DAILY           Objective:   Physical Exam BP 132/70 (BP Location: Left Arm, Patient Position: Sitting, Cuff Size: Normal)   Pulse 82   Temp 98 F (36.7 C) (Oral)   Resp 18   Ht '5\' 9"'$  (1.753 m)   Wt 256 lb 4 oz (116.2 kg)   SpO2 96%   BMI 37.84 kg/m  General:   Well developed, NAD, BMI noted. HEENT:  Normocephalic . Face symmetric, atraumatic Lungs:  CTA B Normal respiratory effort, no intercostal retractions, no accessory muscle use. Heart: RRR,  no murmur.  Lower extremities: no pretibial edema bilaterally  Skin: Not pale. Not jaundice Neurologic:  alert & oriented X3.  Speech normal, gait appropriate for age and unassisted Psych--  Cognition and judgment appear intact.  Cooperative with normal attention span and concentration.  Behavior appropriate. No anxious or depressed appearing.      Assessment      Assessment   DM, + neuropathy  HTN Anxiety, insomnia: On restoril prn, lexapro, xanax for flying only Thyroid nodule: Bx (non neoplastic goiter) 2014,  [place] 05-2016, next 2,3 years Pulmonary: --OSA, sleep study 2004, on a CPAP --Pulmonary emboli: Lifelong anticoagulation PEs:2010 and 12-2013 - saw hematology 12-2014, rec anticoag x 2 years (until 12-2015), then continue with a low-dose xarelto   life   -- multiple pulmonary nodule, last CT 11-2013: No further imaging suggested --RAD, inhalers rarely Fibromyalgia CV:  MVP,  carotid US  1-39%s GI: --GERD,chronic dysphagia s/p  fundoplication 3, esophageal stenosis- - April 2021: Barium study, esophageal manometry (see report) -- IBS --Hemorrhoids: S/p banding ~/2019 -- chronic right flank,RUQ pain : since GB surgery 2008 Etiology not clear but likely multifactorial --> Adhesions from the surgery, neuropathy (DM), postherpetic neuralgia, scarring from PE, etc.  MRI T spine done 06-2014: DJD  Zoster --  2014 right flank  PLAN DM, diet controlled, last A1c great. HTN: Ambulatory BPs normal, last BMP okay.  ContinueMaxide. Lifelong anticoagulation: Saw hematology 08/27/2020, on Xarelto 2.5 mg  B.I.D. Thyroid nodules: Last ultrasound 05/2020, stable Laughing induced syncope: See note from 06/21/2018 for initial event description.  Still have some issues as described above. Advised patient about maneuvers to counteract vasovagal episodes, see AVS. OSA: Good CPAP compliance Hemorrhoids: History of has occasional bleeding, last CBC okay, to see GI soon RTC CPX 04-2021  This visit occurred during the SARS-CoV-2 public health emergency.  Safety protocols were in place, including screening questions prior to the visit, additional usage of staff PPE, and extensive cleaning of exam room while observing appropriate contact time as indicated for disinfecting solutions.

## 2020-11-22 NOTE — Patient Instructions (Addendum)
  -  Leg-crossing with simultaneous tensing of leg, abdominal, and buttock muscles (very effective).  - Handgrip, which consists of maximum grip on a rubber ball or similar object (effectiveness is limited by hand strength).  - Arm tensing, which involves gripping one hand with the other while simultaneously abducting both arms (effectiveness is limited by arm strength).   Check the  blood pressure regularly  BP GOAL is between 110/65 and  135/85. If it is consistently higher or lower, let me know    [place], PLEASE SCHEDULE YOUR APPOINTMENTS Come back for a physical exam by 04-2021

## 2020-11-24 NOTE — Assessment & Plan Note (Signed)
DM, diet controlled, last A1c great. HTN: Ambulatory BPs normal, last BMP okay.  ContinueMaxide. Lifelong anticoagulation: Saw hematology 08/27/2020, on Xarelto 2.5 mg B.I.D. Thyroid nodules: Last ultrasound 05/2020, stable Laughing induced syncope: See note from 06/21/2018 for initial event description.  Still have some issues as described above. Advised patient about maneuvers to counteract vasovagal episodes, see AVS. OSA: Good CPAP compliance Hemorrhoids: History of has occasional bleeding, last CBC okay, to see GI soon RTC CPX 04-2021

## 2020-12-01 ENCOUNTER — Other Ambulatory Visit: Payer: Self-pay | Admitting: Internal Medicine

## 2020-12-01 ENCOUNTER — Encounter: Payer: Self-pay | Admitting: Family

## 2020-12-01 ENCOUNTER — Ambulatory Visit (INDEPENDENT_AMBULATORY_CARE_PROVIDER_SITE_OTHER): Payer: Medicare Other | Admitting: Internal Medicine

## 2020-12-01 ENCOUNTER — Encounter: Payer: Self-pay | Admitting: Internal Medicine

## 2020-12-01 ENCOUNTER — Other Ambulatory Visit: Payer: Self-pay

## 2020-12-01 ENCOUNTER — Telehealth: Payer: Self-pay

## 2020-12-01 VITALS — BP 142/80 | HR 86 | Temp 98.2°F | Resp 18 | Ht 69.0 in | Wt 258.2 lb

## 2020-12-01 DIAGNOSIS — J069 Acute upper respiratory infection, unspecified: Secondary | ICD-10-CM

## 2020-12-01 MED ORDER — BENZONATATE 200 MG PO CAPS: 200.0000 mg | ORAL_CAPSULE | Freq: Three times a day (TID) | ORAL | 0 refills | Status: DC | PRN

## 2020-12-01 NOTE — Telephone Encounter (Signed)
Noted. Will see her this afternoon. 

## 2020-12-01 NOTE — Patient Instructions (Signed)
Rest, fluids , tylenol  For cough:  Take Mucinex DM twice a day as needed until better  If the cough continue, take Ladona Ridgel, prescription sent.  Avoid decongestants such as  Pseudoephedrine or phenylephrine    Call if not gradually better over the next 4 to 5 days

## 2020-12-01 NOTE — Progress Notes (Signed)
Subjective:    Patient ID: Jennifer [name], female    DOB: [birth year], 70 y.o.   MRN: [id number]  DOS:  12/01/2020 Type of visit - description: Acute  Symptoms a started approximately 10 days ago: Mild sore throat, dry cough, chest pain associated with a cough located at the upper anterior chest. Is not taking any over-the-counter cough suppressants. Had 2 negative COVID test.   Review of Systems No fever chills No nausea or vomiting No myalgias No acid reflux  Past Medical History:  Diagnosis Date   Allergy    Anemia    Arthritis    Cataract    Chronic chest pain    Chronic fatigue 02/03/2017   Colon polyps    inflamed partially ulcerated hyperplastic polyp   Diabetes ([place]) 09/24/2013   Diverticulosis    Dysphagia esophageal phase - chronic after 3 fundoplications [id number]   Esophageal stenosis    Stricture after fundoplication REDO   Fibromyalgia    GERD (gastroesophageal reflux disease)    Glaucoma suspect    Heart murmur    no problems    Hemorrhoids, internal, with bleeding 06/27/2017   History of hiatal hernia    Hypertension    IBS (irritable bowel syndrome)    Mitral valve prolapse    OSA (obstructive sleep apnea)    on CPAP-Mild - Sleep study 2004   Pulmonary embolus ([place])    2010 and 12-2013   Sleep apnea    wears CPAP   Thyroid nodule      dr. watching    Tinnitus    Subjective   Zoster 2014   R flank    Past Surgical History:  Procedure Laterality Date   ABDOMINAL HYSTERECTOMY     still has 1 ovary    APPENDECTOMY     BRAIN SURGERY  1995   Stem surgery, Arnold Chiari Malformation   BREAST REDUCTION SURGERY     CATARACT EXTRACTION Bilateral 2017   cataracts, Dr. Herbert Deaner   CHOLECYSTECTOMY     ESOPHAGEAL MANOMETRY N/A 08/06/2019   Procedure: ESOPHAGEAL MANOMETRY (EM);  Surgeon: Gatha Mayer, MD;  Location: [facility];  Service: Endoscopy;  Laterality: N/A;   HEMORRHOID BANDING     HERNIA REPAIR     Hital Hernia   KNEE ARTHROSCOPY   05/16/2012   Procedure: ARTHROSCOPY KNEE;  Surgeon: Sharmon Revere, MD;  Location: [place];  Service: Orthopedics;  Laterality: Left;   KNEE ARTHROSCOPY Right [id number]   NISSEN FUNDOPLICATION     X 3 lab, open x 2 last with mesh   REDUCTION MAMMAPLASTY Bilateral 2007   TOTAL KNEE ARTHROPLASTY Right 08/25/2019   Procedure: RIGHT TOTAL KNEE ARTHROPLASTY;  Surgeon: Gaynelle Arabian, MD;  Location: [facility];  Service: Orthopedics;  Laterality: Right;  23mn    Allergies as of 12/01/2020       Reactions   Morphine And Related Anaphylaxis, Shortness Of Breath   Sulfonamide Derivatives Hives   Burn from inside out   Promethazine Hcl Other (See Comments)   Hallucinations        Medication List        Accurate as of December 01, 2020 11:59 PM. If you have any questions, ask your nurse or doctor.          acetaminophen 500 MG tablet Commonly known as: TYLENOL Take 1 tablet (500 mg total) by mouth every 6 (six) hours as needed.   Aciphex 20 MG tablet Generic drug: RABEprazole TAKE ONE TABLET BY  MOUTH TWICE DAILY   benzonatate 200 MG capsule Commonly known as: TESSALON Take 1 capsule (200 mg total) by mouth 3 (three) times daily as needed for cough. Started by: Kathlene November, MD   dicyclomine 20 MG tablet Commonly known as: BENTYL Take 1 tablet (20 mg total) by mouth every 8 (eight) hours as needed for spasms.   escitalopram 10 MG tablet Commonly known as: LEXAPRO TAKE ONE TABLET BY MOUTH ONE TIME DAILY   glucose blood test strip Check blood sugar once daily   hydrocortisone valerate cream 0.2 % Commonly known as: WESTCORT Apply 1 application topically daily as needed (eczema). eczema   hyoscyamine 0.125 MG SL tablet Commonly known as: LEVSIN SL DISSOLVE ONE TABLET IN MOUTH EVERY 4 HOURS AS NEEDED   multivitamin with minerals tablet Take 1 tablet by mouth daily. Rainbow light   OneTouch Delica Lancets 99991111 Misc Check blood sugar once daily   Prevagen 10 MG Caps Generic drug:  Apoaequorin Take 10 mg by mouth daily. prevagen   Restasis 0.05 % ophthalmic emulsion Generic drug: cycloSPORINE Restasis 0.05 % eye drops in a dropperette  INSTILL 1 DROP INTO EACH EYE TWICE DAILY   sucralfate 1 g tablet Commonly known as: Carafate Take 1 gram by mouth three times daily before meals PHARMACY-PLEASE HAVE PATIENT MAKE INTO A SLURRY   temazepam 30 MG capsule Commonly known as: RESTORIL TAKE ONE CAPSULE BY MOUTH AT BEDTIME AS NEEDED FOR SLEEP   triamterene-hydrochlorothiazide 37.5-25 MG tablet Commonly known as: MAXZIDE-25 Take 0.5 tablets by mouth daily.   Xarelto 2.5 MG Tabs tablet Generic drug: rivaroxaban TAKE TWO TABLETS BY MOUTH DAILY           Objective:   Physical Exam BP (!) 142/80 (BP Location: Left Arm, Patient Position: Sitting, Cuff Size: Normal)   Pulse 86   Temp 98.2 F (36.8 C) (Oral)   Resp 18   Ht '5\' 9"'$  (1.753 m)   Wt 258 lb 4 oz (117.1 kg)   SpO2 98%   BMI 38.14 kg/m  General:   Well developed, NAD, BMI noted. HEENT:  Normocephalic . Face symmetric, atraumatic. TMs: Normal Throat symmetric not red Lungs:  CTA B Normal respiratory effort, no intercostal retractions, no accessory muscle use. Heart: RRR,  no murmur.  Lower extremities: no pretibial edema bilaterally  Skin: Not pale. Not jaundice Neurologic:  alert & oriented X3.  Speech normal, gait appropriate for age and unassisted Psych--  Cognition and judgment appear intact.  Cooperative with normal attention span and concentration.  Behavior appropriate. No anxious or depressed appearing.      Assessment      Assessment   DM, + neuropathy  HTN Anxiety, insomnia: On restoril prn, lexapro, xanax for flying only Thyroid nodule: Bx (non neoplastic goiter) 2014,  [place] 05-2016, next 2,3 years Pulmonary: --OSA, sleep study 2004, on a CPAP --Pulmonary emboli: Lifelong anticoagulation PEs:2010 and 12-2013 - saw hematology 12-2014, rec anticoag x 2 years (until 12-2015),  then continue with a low-dose xarelto   life   -- multiple pulmonary nodule, last CT 11-2013: No further imaging suggested --RAD, inhalers rarely Fibromyalgia CV:  MVP,  carotid US 1-39%s GI: --GERD,chronic dysphagia s/p  fundoplication 3, esophageal stenosis- - April 2021: Barium study, esophageal manometry (see report) -- IBS --Hemorrhoids: S/p banding ~/2019 -- chronic right flank,RUQ pain : since GB surgery 2008 Etiology not clear but likely multifactorial --> Adhesions from the surgery, neuropathy (DM), postherpetic neuralgia, scarring from PE, etc.  MRI T  spine done 06-2014: DJD  Zoster --  2014 right flank  PLAN URI: Respiratory symptoms for 10 days, COVID test negative x2, cough is associated with some anterior chest pain (tracheitis?). Plan: Conservative treatment with fluids, Tylenol, Mucinex DM.  Also [employer]  Call if not better. See AVS   This visit occurred during the SARS-CoV-2 public health emergency.  Safety protocols were in place, including screening questions prior to the visit, additional usage of staff PPE, and extensive cleaning of exam room while observing appropriate contact time as indicated for disinfecting solutions.

## 2020-12-01 NOTE — Telephone Encounter (Signed)
Pt was last seen on 11/22/20 and had 2 neg covid test on yesterday.   Pt stated she needs something for her dry cough and acid reflux with the sore throat due to cough.   Pt wanted something something to the pharmacy. Started as a throat staring a week and half ago since she ate something spicy.    Appt scheduled for today. FYI

## 2020-12-02 NOTE — Assessment & Plan Note (Signed)
URI: Respiratory symptoms for 10 days, COVID test negative x2, cough is associated with some anterior chest pain (tracheitis?). Plan: Conservative treatment with fluids, Tylenol, Mucinex DM.  Also [employer]  Call if not better. See AVS

## 2020-12-09 ENCOUNTER — Other Ambulatory Visit: Payer: Self-pay | Admitting: Internal Medicine

## 2020-12-12 ENCOUNTER — Other Ambulatory Visit: Payer: Self-pay | Admitting: Internal Medicine

## 2020-12-16 ENCOUNTER — Other Ambulatory Visit: Payer: Self-pay | Admitting: Internal Medicine

## 2020-12-20 ENCOUNTER — Telehealth: Payer: Self-pay | Admitting: Internal Medicine

## 2020-12-20 MED ORDER — AZITHROMYCIN 250 MG PO TABS: ORAL_TABLET | ORAL | 0 refills | Status: DC

## 2020-12-20 MED ORDER — BENZONATATE 200 MG PO CAPS: ORAL_CAPSULE | ORAL | 0 refills | Status: DC

## 2020-12-20 NOTE — Telephone Encounter (Signed)
Spoke w/ Pt- informed of recommendations. Pt verbalized understanding. Rx's sent.

## 2020-12-20 NOTE — Telephone Encounter (Signed)
Pt called back to check on status of this request.  Pt upset because this has not been handled.  I advised pt PCP is seeing patients this morning and will handle request as he can.  She stated it can be handled between patients and she should not have to wait until 5 pm.  I advised patient the request would be handled as soon as PCP can get to it and it will not be delayed that long.

## 2020-12-20 NOTE — Telephone Encounter (Signed)
She could have a atypical bronchitis, recommend a Z-Pak, send a prescription. Also if she needs RF on Tessalon Perles okay to send. Office visit if not better in the next few days

## 2020-12-20 NOTE — Telephone Encounter (Signed)
Pt. Called in stating she is not feeling better since the last time she was seen. She still has the same symptoms. She stated she started to feel better and once the medicine was completely gone that's when the symptoms returned ( cough, headache,congestion,). As of the night of 8/29 she is Covid negative. Is wanting to see if she can maybe start taking the medication she was prescribed again.

## 2020-12-20 NOTE — Telephone Encounter (Signed)
Please advise 

## 2020-12-21 ENCOUNTER — Encounter: Payer: Self-pay | Admitting: Gastroenterology

## 2020-12-21 ENCOUNTER — Encounter: Payer: Self-pay | Admitting: Family

## 2020-12-21 ENCOUNTER — Telehealth: Payer: Self-pay | Admitting: *Deleted

## 2020-12-21 ENCOUNTER — Ambulatory Visit (INDEPENDENT_AMBULATORY_CARE_PROVIDER_SITE_OTHER): Payer: Medicare Other | Admitting: Gastroenterology

## 2020-12-21 VITALS — BP 122/70 | HR 84 | Ht 68.0 in | Wt 256.0 lb

## 2020-12-21 DIAGNOSIS — Z7901 Long term (current) use of anticoagulants: Secondary | ICD-10-CM | POA: Diagnosis not present

## 2020-12-21 DIAGNOSIS — K602 Anal fissure, unspecified: Secondary | ICD-10-CM | POA: Diagnosis not present

## 2020-12-21 DIAGNOSIS — K582 Mixed irritable bowel syndrome: Secondary | ICD-10-CM

## 2020-12-21 DIAGNOSIS — Z86010 Personal history of colonic polyps: Secondary | ICD-10-CM | POA: Diagnosis not present

## 2020-12-21 DIAGNOSIS — K625 Hemorrhage of anus and rectum: Secondary | ICD-10-CM

## 2020-12-21 DIAGNOSIS — K219 Gastro-esophageal reflux disease without esophagitis: Secondary | ICD-10-CM | POA: Diagnosis not present

## 2020-12-21 DIAGNOSIS — R194 Change in bowel habit: Secondary | ICD-10-CM

## 2020-12-21 MED ORDER — PLENVU 140 G PO SOLR: ORAL | 0 refills | Status: DC

## 2020-12-21 MED ORDER — AMBULATORY NON FORMULARY MEDICATION: 1 refills | Status: DC

## 2020-12-21 NOTE — Patient Instructions (Signed)
You will be contacted by our office prior to your procedure for directions on holding your Xarelto.  If you do not hear from our office 1 week prior to your scheduled procedure, please call [phone] to discuss.    You have been scheduled for a colonoscopy. Please follow written instructions given to you at your visit today.  Please pick up your prep supplies at the pharmacy within the next 1-3 days. If you use inhalers (even only as needed), please bring them with you on the day of your procedure.   We have sent a prescription for nitroglycerin 0.125% gel to [facility]. You should apply a pea size amount to your rectum three times daily x 6-8 weeks.  [facility]'s information is below: Address: [address] [place], [place] [address]  [phone]  *Please DO NOT go directly from our office to pick up this medication! Give the pharmacy 1 day to process the prescription as this is compounded and takes time to make.   If you are age 39 or older, your body mass index should be between 23-30. Your Body mass index is 38.92 kg/m. If this is out of the aforementioned range listed, please consider follow up with your Primary Care Provider.  If you are age 64 or younger, your body mass index should be between 19-25. Your Body mass index is 38.92 kg/m. If this is out of the aformentioned range listed, please consider follow up with your Primary Care Provider.   __________________________________________________________  The [place] [facility] providers would like to encourage you to use [facility] to communicate with providers for non-urgent requests or questions.  Due to long hold times on the telephone, sending your provider a message by [facility] may be a faster and more efficient way to get a response.  Please allow 48 business hours for a response.  Please remember that this is for non-urgent requests.    Due to recent changes in healthcare laws, you may see the results of your  imaging and laboratory studies on MyChart before your provider has had a chance to review them.  We understand that in some cases there may be results that are confusing or concerning to you. Not all laboratory results come back in the same time frame and the provider may be waiting for multiple results in order to interpret others.  Please give [place] 48 hours in order for your provider to thoroughly review all the results before contacting the office for clarification of your results.  I appreciate the  opportunity to care for you  Thank You   Harl Bowie , MD

## 2020-12-21 NOTE — Telephone Encounter (Signed)
    Patient Name: Jennifer [name]  DOB: [birth year] MRN: [id number]  Primary Cardiologist: None  Chart reviewed as part of pre-operative protocol coverage. Patient is scheduled for a colonoscopy on 12/31/2020 and we were asked to give our recommendations for holding Xarelto. However, patient is on Xarelto for recurrent PE and this is managed by [name]. She is not followed in our office (looks like she was previously seen in our Coumadin clinic). Therefore, will defer recommendations for holding Xarelto to [name].  I will route this recommendation to the requesting party via Epic fax function and remove from pre-op pool.  Please call with questions.  Darreld Mclean, PA-C 12/21/2020, 11:44 AM

## 2020-12-21 NOTE — Telephone Encounter (Signed)
[facility] Pre-operative Risk Assessment     Request for surgical clearance:     Endoscopy Procedure  What type of surgery is being performed?     colonoscopy  When is this surgery scheduled?     12/31/2020  What type of clearance is required ?   Pharmacy  Are there any medications that need to be held prior to surgery and how long? [place] name and name of physician performing surgery?      [facility]  What is your office phone and fax number?      Phone- [phone]  [phone]  Anesthesia type (None, local, MAC, general) ?       MAC

## 2020-12-21 NOTE — Progress Notes (Signed)
Jennifer [name]    [id number]    03-04-1951  Primary Care Physician:Paz, Alda Berthold, MD  Referring Physician: Colon Branch, MD [address] [place],  [place] [address]   Chief complaint:  Rectal bleeding, change in bowel habits  HPI:  70 yr very pleasant F with complaints of rectal bleeding and change in bowel habits   She has been having rectal bleeding almost for a year now, worse in the past few months.  She notices blood coating the stool.  She is having alternating constipation and diarrhea.  Some days she has multiple bowel movements. She has occasional abdominal discomfort.  Also is having rectal discomfort  Colonoscopy December 14, 2015: She had removal of small sessile polyps, one SSP and one tubular adenoma.  Due for recall colonoscopy August 2022   She has history of chronic dysphagia, s/p Nissen fundoplication X3   Esophageal manometry August 06, 2019: Findings suggestive of EG junction outflow obstruction with intact peristalsis, negative for achalasia.   EGD September 28, 2016: It is post Nissen fundoplication with intact wrap, dilated with TTS balloon to 18 mm    She is on chronic anticoagulation for DVT and PE   Outpatient Encounter Medications as of 12/21/2020  Medication Sig   acetaminophen (TYLENOL) 500 MG tablet Take 1 tablet (500 mg total) by mouth every 6 (six) hours as needed.   ACIPHEX 20 MG tablet TAKE ONE TABLET BY MOUTH TWICE DAILY   Apoaequorin (PREVAGEN) 10 MG CAPS Take 10 mg by mouth daily. prevagen   azithromycin (ZITHROMAX) 250 MG tablet Take 2 tablets by mouth first day, then 1 tablet for 4 additional days   benzonatate (TESSALON) 200 MG capsule TAKE 1 CAPSULE BY MOUTH 3 TIMES A DAY AS NEEDED FOR COUGH   cycloSPORINE (RESTASIS) 0.05 % ophthalmic emulsion Restasis 0.05 % eye drops in a dropperette  INSTILL 1 DROP INTO EACH EYE TWICE DAILY   dicyclomine (BENTYL) 20 MG tablet Take 1 tablet (20 mg total) by mouth every 8 (eight) hours as  needed for spasms.   escitalopram (LEXAPRO) 10 MG tablet TAKE ONE TABLET BY MOUTH ONE TIME DAILY   glucose blood test strip Check blood sugar once daily   hydrocortisone valerate cream (WESTCORT) 0.2 % Apply 1 application topically daily as needed (eczema). eczema   hyoscyamine (LEVSIN SL) 0.125 MG SL tablet DISSOLVE ONE TABLET IN MOUTH EVERY 4 HOURS AS NEEDED   Multiple Vitamins-Minerals (MULTIVITAMIN WITH MINERALS) tablet Take 1 tablet by mouth daily. Rainbow light   ondansetron (ZOFRAN) 8 MG tablet Take 1 tablet by mouth as needed.   ONETOUCH DELICA LANCETS 99991111 MISC Check blood sugar once daily   prednisoLONE acetate (PRED FORTE) 1 % ophthalmic suspension Place 1 drop into both eyes as needed.   sucralfate (CARAFATE) 1 g tablet Take 1 gram by mouth three times daily before meals PHARMACY-PLEASE HAVE PATIENT MAKE INTO A SLURRY   temazepam (RESTORIL) 30 MG capsule TAKE ONE CAPSULE BY MOUTH AT BEDTIME AS NEEDED FOR SLEEP   triamterene-hydrochlorothiazide (MAXZIDE-25) 37.5-25 MG tablet Take 0.5 tablets by mouth daily.   XARELTO 2.5 MG TABS tablet TAKE TWO TABLETS BY MOUTH DAILY   No facility-administered encounter medications on file as of 12/21/2020.    Allergies as of 12/21/2020 - Review Complete 12/21/2020  Allergen Reaction Noted   Morphine and related Anaphylaxis and Shortness Of Breath 12/01/2013   Sulfonamide derivatives Hives 12/02/2018  Promethazine hcl Other (See Comments) 12/02/2018    Past Medical History:  Diagnosis Date   Allergy    Anemia    Arthritis    Cataract    Chronic chest pain    Chronic fatigue 02/03/2017   Colon polyps    inflamed partially ulcerated hyperplastic polyp   Diabetes ([place]) 09/24/2013   Diverticulosis    Dysphagia esophageal phase - chronic after 3 fundoplications [id number]   Esophageal stenosis    Stricture after fundoplication REDO   Fibromyalgia    GERD (gastroesophageal reflux disease)    Glaucoma suspect    Heart murmur    no problems     Hemorrhoids, internal, with bleeding 06/27/2017   History of hiatal hernia    Hypertension    IBS (irritable bowel syndrome)    Mitral valve prolapse    OSA (obstructive sleep apnea)    on CPAP-Mild - Sleep study 2004   Pulmonary embolus ([place])    2010 and 12-2013   Sleep apnea    wears CPAP   Thyroid nodule      dr. watching    Tinnitus    Subjective   Zoster 2014   R flank    Past Surgical History:  Procedure Laterality Date   ABDOMINAL HYSTERECTOMY     still has 1 ovary    APPENDECTOMY     BRAIN SURGERY  1995   Stem surgery, Arnold Chiari Malformation   BREAST REDUCTION SURGERY     CATARACT EXTRACTION Bilateral 2017   cataracts, Dr. Herbert Deaner   CHOLECYSTECTOMY     ESOPHAGEAL MANOMETRY N/A 08/06/2019   Procedure: ESOPHAGEAL MANOMETRY (EM);  Surgeon: Gatha Mayer, MD;  Location: [facility];  Service: Endoscopy;  Laterality: N/A;   HEMORRHOID BANDING     HERNIA REPAIR     Hital Hernia   KNEE ARTHROSCOPY  05/16/2012   Procedure: ARTHROSCOPY KNEE;  Surgeon: Sharmon Revere, MD;  Location: [place];  Service: Orthopedics;  Laterality: Left;   KNEE ARTHROSCOPY Right [id number]   NISSEN FUNDOPLICATION     X 3 lab, open x 2 last with mesh   REDUCTION MAMMAPLASTY Bilateral 2007   TOTAL KNEE ARTHROPLASTY Right 08/25/2019   Procedure: RIGHT TOTAL KNEE ARTHROPLASTY;  Surgeon: Gaynelle Arabian, MD;  Location: [facility];  Service: Orthopedics;  Laterality: Right;  39mn    Family History  Problem Relation Age of Onset   Diabetes Mother    Rheum arthritis Mother    Hypertension Mother    Hypertension Father    Rheum arthritis Sister    Hypertension Sister    Colon cancer Maternal Grandmother    Rheum arthritis Sister    Rheum arthritis Sister    Rheum arthritis Sister    CAD Sister        MI  dx age 68  Stomach cancer Maternal Uncle    Breast cancer Neg Hx    Esophageal cancer Neg Hx    Rectal cancer Neg Hx    Prostate cancer Neg Hx    Pancreatic cancer Neg Hx     Social  History   Socioeconomic History   Marital status: Married    Spouse name: Not on file   Number of children: 1   Years of education: Not on file   Highest education level: Not on file  Occupational History   Occupation: retired form the [employer]-2017    Employer: [employer]  Tobacco Use   Smoking status: Never   Smokeless  tobacco: Never   Tobacco comments:    never used tobacco  Vaping Use   Vaping Use: Never used  Substance and Sexual Activity   Alcohol use: No    Alcohol/week: 0.0 standard drinks   Drug use: No   Sexual activity: Not Currently    Birth control/protection: None  Other Topics Concern   Not on file  Social History Narrative   [employer] Graduate - Technical school   Married 08/01/2068 for 2 years, divorced; re-married 08/01/1984 for 2.5 years, divorced; re-married 08-01-1988 for 1 year, divorced; re-married '00   1 son - 2070-08-02   Sister - Died @ 51 [place]       Social Determinants of Radio broadcast assistant Strain: Not on file  Food Insecurity: Not on file  Transportation Needs: Not on file  Physical Activity: Not on file  Stress: Not on file  Social Connections: Not on file  Intimate Partner Violence: Not on file      Review of systems: All other review of systems negative except as mentioned in the HPI.   Physical Exam: Vitals:   12/21/20 0948  BP: 122/70  Pulse: 84   Body mass index is 38.92 kg/m. Gen:      No acute distress HEENT:  sclera anicteric Neuro: alert and oriented x 3 Psych: normal mood and affect  Data Reviewed:  Reviewed labs, radiology imaging, old records and pertinent past GI work up   Assessment and Plan/Recommendations:  70 year old female with history of chronic GERD, s/p Nissen fundoplication, history of adenomatous colon polyps with complaints of rectal bleeding and change in bowel habits   She is having rectal discomfort associated with bleeding, possible anal fissure.  Patient deferred rectal exam  Will empirically treat  with 0.125% nitroglycerin with RectiCare small pea-sized amount per rectum 3 times daily. Schedule for colonoscopy as she is due for surveillance colonoscopy for history of polyps and will also need to further evaluate rectal bleeding  Change in bowel habits with diarrhea, will obtain biopsies if needed based on colonoscopy findings  The risks and benefits as well as alternatives of endoscopic procedure(s) have been discussed and reviewed. All questions answered. The patient agrees to proceed. We will request clearance to hold Xarelto for 2 days prior to the procedure from prescribing MD   GERD: Continue Aciphex and antireflux measures  This visit required 40 minutes of patient care (this includes precharting, chart review, review of results, face-to-face time used for counseling as well as treatment plan and follow-up. The patient was provided an opportunity to ask questions and all were answered. The patient agreed with the plan and demonstrated an understanding of the instructions.  Damaris Hippo , MD    CC: Colon Branch, MD

## 2020-12-22 ENCOUNTER — Other Ambulatory Visit: Payer: Self-pay

## 2020-12-22 ENCOUNTER — Ambulatory Visit (HOSPITAL_BASED_OUTPATIENT_CLINIC_OR_DEPARTMENT_OTHER)
Admission: RE | Admit: 2020-12-22 | Discharge: 2020-12-22 | Disposition: A | Discharge status: Home / Self Care | Payer: Medicare Other | Source: Ambulatory Visit | Attending: Internal Medicine | Admitting: Internal Medicine

## 2020-12-22 ENCOUNTER — Ambulatory Visit (INDEPENDENT_AMBULATORY_CARE_PROVIDER_SITE_OTHER): Payer: Medicare Other | Admitting: Internal Medicine

## 2020-12-22 ENCOUNTER — Encounter: Payer: Self-pay | Admitting: *Deleted

## 2020-12-22 ENCOUNTER — Encounter: Payer: Self-pay | Admitting: Internal Medicine

## 2020-12-22 VITALS — BP 126/80 | HR 80 | Temp 98.9°F | Resp 18 | Ht 69.0 in | Wt 256.0 lb

## 2020-12-22 DIAGNOSIS — R059 Cough, unspecified: Secondary | ICD-10-CM | POA: Insufficient documentation

## 2020-12-22 DIAGNOSIS — R61 Generalized hyperhidrosis: Secondary | ICD-10-CM | POA: Insufficient documentation

## 2020-12-22 DIAGNOSIS — J019 Acute sinusitis, unspecified: Secondary | ICD-10-CM | POA: Diagnosis not present

## 2020-12-22 MED ORDER — PREDNISONE 10 MG PO TABS: ORAL_TABLET | ORAL | 0 refills | Status: DC

## 2020-12-22 NOTE — Progress Notes (Signed)
Subjective:    Patient ID: Jennifer [name], female    DOB: Aug 29, 1950, 70 y.o.   MRN: [id number]  DOS:  12/22/2020 Type of visit - description: Acute  Symptoms started approximately 11/22/2020. Was seen in person on 12/01/2020, dx  with a URI and prescribed Tessalon Perles. She did not get much better, she called 12/20/2020, she continue with symptoms, had a negative COVID test then. Was prescribed Zithromax.  She is here today because she is not feeling better. Current symptoms are: Cough Had night sweats last night Has a frontal headache and pain/pressure behind the right eye.  Denies any fever. No sore throat No much sinus congestion No chest pain or difficulty breathing Cough productive of yellowish sputum.  No wheezing. Denies jaw claudication  Review of Systems See above   Past Medical History:  Diagnosis Date   Allergy    Anemia    Arthritis    Cataract    Chronic chest pain    Chronic fatigue 02/03/2017   Colon polyps    inflamed partially ulcerated hyperplastic polyp   Diabetes ([place]) 09/24/2013   Diverticulosis    Dysphagia esophageal phase - chronic after 3 fundoplications [id number]   Esophageal stenosis    Stricture after fundoplication REDO   Fibromyalgia    GERD (gastroesophageal reflux disease)    Glaucoma suspect    Heart murmur    no problems    Hemorrhoids, internal, with bleeding 06/27/2017   History of hiatal hernia    Hypertension    IBS (irritable bowel syndrome)    Mitral valve prolapse    OSA (obstructive sleep apnea)    on CPAP-Mild - Sleep study 2004   Pulmonary embolus ([place])    2010 and 12-2013   Sleep apnea    wears CPAP   Thyroid nodule      dr. watching    Tinnitus    Subjective   Zoster 2014   R flank    Past Surgical History:  Procedure Laterality Date   ABDOMINAL HYSTERECTOMY     still has 1 ovary    APPENDECTOMY     BRAIN SURGERY  1995   Stem surgery, Arnold Chiari Malformation   BREAST REDUCTION SURGERY     CATARACT  EXTRACTION Bilateral 2017   cataracts, Dr. Herbert Deaner   CHOLECYSTECTOMY     ESOPHAGEAL MANOMETRY N/A 08/06/2019   Procedure: ESOPHAGEAL MANOMETRY (EM);  Surgeon: Gatha Mayer, MD;  [facility];  Service: Endoscopy;  Laterality: N/A;   HEMORRHOID BANDING     HERNIA REPAIR     Hital Hernia   KNEE ARTHROSCOPY  05/16/2012   Procedure: ARTHROSCOPY KNEE;  Surgeon: Sharmon Revere, MD;  Location: [place];  Service: Orthopedics;  Laterality: Left;   KNEE ARTHROSCOPY Right [id number]   NISSEN FUNDOPLICATION     X 3 lab, open x 2 last with mesh   REDUCTION MAMMAPLASTY Bilateral 2007   TOTAL KNEE ARTHROPLASTY Right 08/25/2019   Procedure: RIGHT TOTAL KNEE ARTHROPLASTY;  Surgeon: Gaynelle Arabian, MD;  [facility];  Service: Orthopedics;  Laterality: Right;  46mn    Allergies as of 12/22/2020       Reactions   Morphine And Related Anaphylaxis, Shortness Of Breath   Sulfonamide Derivatives Hives   Burn from inside out   Promethazine Hcl Other (See Comments)   Hallucinations        Medication List        Accurate as of December 22, 2020 11:59 PM. If  you have any questions, ask your nurse or doctor.          acetaminophen 500 MG tablet Commonly known as: TYLENOL Take 1 tablet (500 mg total) by mouth every 6 (six) hours as needed.   Aciphex 20 MG tablet Generic drug: RABEprazole TAKE ONE TABLET BY MOUTH TWICE DAILY   AMBULATORY NON FORMULARY MEDICATION Medication Name: Nitroglycerin ointment 0.125% use small pea sized amount per rectum three times a day for 6-8 weeks   azithromycin 250 MG tablet Commonly known as: ZITHROMAX Take 2 tablets by mouth first day, then 1 tablet for 4 additional days   benzonatate 200 MG capsule Commonly known as: TESSALON TAKE 1 CAPSULE BY MOUTH 3 TIMES A DAY AS NEEDED FOR COUGH   dicyclomine 20 MG tablet Commonly known as: BENTYL Take 1 tablet (20 mg total) by mouth every 8 (eight) hours as needed for spasms.   escitalopram 10 MG  tablet Commonly known as: LEXAPRO TAKE ONE TABLET BY MOUTH ONE TIME DAILY   glucose blood test strip Check blood sugar once daily   hydrocortisone valerate cream 0.2 % Commonly known as: WESTCORT Apply 1 application topically daily as needed (eczema). eczema   hyoscyamine 0.125 MG SL tablet Commonly known as: LEVSIN SL DISSOLVE ONE TABLET IN MOUTH EVERY 4 HOURS AS NEEDED   multivitamin with minerals tablet Take 1 tablet by mouth daily. Rainbow light   ondansetron 8 MG tablet Commonly known as: ZOFRAN Take 1 tablet by mouth as needed.   OneTouch Delica Lancets 99991111 Misc Check blood sugar once daily   Plenvu 140 g Solr Generic drug: PEG-KCl-NaCl-NaSulf-Na Asc-C For colon prep as directed by doctors office   prednisoLONE acetate 1 % ophthalmic suspension Commonly known as: PRED FORTE Place 1 drop into both eyes as needed.   predniSONE 10 MG tablet Commonly known as: DELTASONE 3 tabs x 3 days, 2 tabs x 3 days, 1 tab x 3 days Started by: Kathlene November, MD   Prevagen 10 MG Caps Generic drug: Apoaequorin Take 10 mg by mouth daily. prevagen   Restasis 0.05 % ophthalmic emulsion Generic drug: cycloSPORINE Restasis 0.05 % eye drops in a dropperette  INSTILL 1 DROP INTO EACH EYE TWICE DAILY   sucralfate 1 g tablet Commonly known as: Carafate Take 1 gram by mouth three times daily before meals PHARMACY-PLEASE HAVE PATIENT MAKE INTO A SLURRY   temazepam 30 MG capsule Commonly known as: RESTORIL TAKE ONE CAPSULE BY MOUTH AT BEDTIME AS NEEDED FOR SLEEP   triamterene-hydrochlorothiazide 37.5-25 MG tablet Commonly known as: MAXZIDE-25 Take 0.5 tablets by mouth daily.   Xarelto 2.5 MG Tabs tablet Generic drug: rivaroxaban TAKE TWO TABLETS BY MOUTH DAILY           Objective:   Physical Exam BP 126/80 (BP Location: Left Arm, Patient Position: Sitting, Cuff Size: Normal)   Pulse 80   Temp 98.9 F (37.2 C) (Oral)   Resp 18   Ht '5\' 9"'$  (1.753 m)   Wt 256 lb (116.1 kg)    SpO2 97%   BMI 37.80 kg/m  General:   Well developed, NAD, BMI noted. HEENT:  Normocephalic . Face symmetric, atraumatic. EOMI without pain, pupils equal and reactive, anterior chambers normal, conjunctiva not injected.  No photophobia, no tearing. Sinuses: No TTP. Temporal arteries: No TTP. Throat: Symmetric, not red. Lungs:  CTA B Normal respiratory effort, no intercostal retractions, no accessory muscle use. Heart: RRR,  no murmur.  Lower extremities: no pretibial edema bilaterally  Skin: Not pale. Not jaundice Neurologic:  alert & oriented X3.  Speech normal, gait appropriate for age and unassisted Psych--  Cognition and judgment appear intact.  Cooperative with normal attention span and concentration.  Behavior appropriate. No anxious or depressed appearing.      Assessment       Assessment   DM, + neuropathy  HTN Anxiety, insomnia: On restoril prn, lexapro, xanax for flying only Thyroid nodule: Bx (non neoplastic goiter) 2014,  [place] 05-2016, next 2,3 years Pulmonary: --OSA, sleep study 2004, on a CPAP --Pulmonary emboli: Lifelong anticoagulation PEs:2010 and 12-2013 - saw hematology 12-2014, rec anticoag x 2 years (until 12-2015), then continue with a low-dose xarelto   life   -- multiple pulmonary nodule, last CT 11-2013: No further imaging suggested --RAD, inhalers rarely Fibromyalgia CV:  MVP,  carotid US 1-39%s GI: --GERD,chronic dysphagia s/p  fundoplication 3, esophageal stenosis- - April 2021: Barium study, esophageal manometry (see report) -- IBS --Hemorrhoids: S/p banding ~/2019 -- chronic right flank,RUQ pain : since GB surgery 2008 Etiology not clear but likely multifactorial --> Adhesions from the surgery, neuropathy (DM), postherpetic neuralgia, scarring from PE, etc.  MRI T spine done 06-2014: DJD  Zoster --  2014 right flank  PLAN Persistent cough, sinusitis: Respiratory symptoms a started almost 4 weeks ago, initially felt to be a URI, did not get  better, started a Z-Pak 2 days ago, continue with cough and pain at the right sinus area and behind the right eye. Eye exam is benign, no jaw claudication, no TTP over the temporal artery. Plan: Continue Zithromax for presumed sinusitis, add a  low dose of steroids.  Flonase.    Chest x-ray for persistent cough. H/o Episcleritis, glaucoma suspect: Per chart review, recommend to see eye doctor this week since she is having pain behind the eye.   This visit occurred during the SARS-CoV-2 public health emergency.  Safety protocols were in place, including screening questions prior to the visit, additional usage of staff PPE, and extensive cleaning of exam room while observing appropriate contact time as indicated for disinfecting solutions.

## 2020-12-22 NOTE — Patient Instructions (Signed)
Proceed with a chest x-ray downstairs  You possibly have sinusitis or bronchitis. Rest Fluids Robitussin-DM or Mucinex DM Continue Zithromax At a round of prednisone for few days, stop if your blood sugar is more than 200. Use Flonase: 2sprays on each side of the nose daily See your eye doctor this week. Call if not gradually better.

## 2020-12-23 NOTE — Assessment & Plan Note (Signed)
Persistent cough, sinusitis: Respiratory symptoms a started almost 4 weeks ago, initially felt to be a URI, did not get better, started a Z-Pak 2 days ago, continue with cough and pain at the right sinus area and behind the right eye. Eye exam is benign, no jaw claudication, no TTP over the temporal artery. Plan: Continue Zithromax for presumed sinusitis, add a  low dose of steroids.  Flonase.    Chest x-ray for persistent cough. H/o Episcleritis, glaucoma suspect: Per chart review, recommend to see eye doctor this week since she is having pain behind the eye.

## 2020-12-24 ENCOUNTER — Telehealth: Payer: Self-pay | Admitting: Gastroenterology

## 2020-12-24 NOTE — Telephone Encounter (Signed)
Inbound call from patient requesting a call from a nurse please in regards to upcoming procedure and symptoms she is currently experiencing.

## 2020-12-24 NOTE — Telephone Encounter (Signed)
Received hand written fax from Dr Dicie Beam office that patient can hold xarelto 2 days prior to procedure, called patient to inform will have letter scanned in   Patient actually wanted to reschedule her procedure to 9/26 at 2:30 pm due to an upper respiratory infection she has and is on prednisone until next Sunday

## 2020-12-28 NOTE — Telephone Encounter (Signed)
Bronchitis. Colonoscopy rescheduled.

## 2020-12-29 ENCOUNTER — Inpatient Hospital Stay: Payer: Medicare Other | Attending: Hematology & Oncology

## 2020-12-29 ENCOUNTER — Other Ambulatory Visit: Payer: Self-pay

## 2020-12-29 ENCOUNTER — Inpatient Hospital Stay (HOSPITAL_BASED_OUTPATIENT_CLINIC_OR_DEPARTMENT_OTHER): Payer: Medicare Other | Admitting: Hematology & Oncology

## 2020-12-29 ENCOUNTER — Telehealth: Payer: Self-pay

## 2020-12-29 ENCOUNTER — Encounter: Payer: Self-pay | Admitting: Hematology & Oncology

## 2020-12-29 DIAGNOSIS — I2601 Septic pulmonary embolism with acute cor pulmonale: Secondary | ICD-10-CM | POA: Diagnosis not present

## 2020-12-29 DIAGNOSIS — Z7901 Long term (current) use of anticoagulants: Secondary | ICD-10-CM | POA: Diagnosis not present

## 2020-12-29 DIAGNOSIS — Z86711 Personal history of pulmonary embolism: Secondary | ICD-10-CM | POA: Diagnosis not present

## 2020-12-29 DIAGNOSIS — Z7952 Long term (current) use of systemic steroids: Secondary | ICD-10-CM | POA: Insufficient documentation

## 2020-12-29 DIAGNOSIS — E083513 Diabetes mellitus due to underlying condition with proliferative diabetic retinopathy with macular edema, bilateral: Secondary | ICD-10-CM

## 2020-12-29 DIAGNOSIS — I2699 Other pulmonary embolism without acute cor pulmonale: Secondary | ICD-10-CM | POA: Insufficient documentation

## 2020-12-29 LAB — CBC WITH DIFFERENTIAL (CANCER CENTER ONLY)
Abs Immature Granulocytes: 0.13 10*3/uL — ABNORMAL HIGH (ref 0.00–0.07)
Basophils Absolute: 0.1 10*3/uL (ref 0.0–0.1)
Basophils Relative: 1 %
Eosinophils Absolute: 0 10*3/uL (ref 0.0–0.5)
Eosinophils Relative: 0 %
HCT: 35.3 % — ABNORMAL LOW (ref 36.0–46.0)
Hemoglobin: 11.6 g/dL — ABNORMAL LOW (ref 12.0–15.0)
Immature Granulocytes: 2 %
Lymphocytes Relative: 31 %
Lymphs Abs: 2.5 10*3/uL (ref 0.7–4.0)
MCH: 28.7 pg (ref 26.0–34.0)
MCHC: 32.9 g/dL (ref 30.0–36.0)
MCV: 87.4 fL (ref 80.0–100.0)
Monocytes Absolute: 0.6 10*3/uL (ref 0.1–1.0)
Monocytes Relative: 7 %
Neutro Abs: 4.9 10*3/uL (ref 1.7–7.7)
Neutrophils Relative %: 59 %
Platelet Count: 301 10*3/uL (ref 150–400)
RBC: 4.04 MIL/uL (ref 3.87–5.11)
RDW: 14.7 % (ref 11.5–15.5)
WBC Count: 8.2 10*3/uL (ref 4.0–10.5)
nRBC: 0 % (ref 0.0–0.2)

## 2020-12-29 LAB — CMP (CANCER CENTER ONLY)
ALT: 23 U/L (ref 0–44)
AST: 18 U/L (ref 15–41)
Albumin: 3.8 g/dL (ref 3.5–5.0)
Alkaline Phosphatase: 55 U/L (ref 38–126)
Anion gap: 8 (ref 5–15)
BUN: 22 mg/dL (ref 8–23)
CO2: 31 mmol/L (ref 22–32)
Calcium: 9 mg/dL (ref 8.9–10.3)
Chloride: 103 mmol/L (ref 98–111)
Creatinine: 0.94 mg/dL (ref 0.44–1.00)
GFR, Estimated: >60 mL/min (ref >60)
Glucose, Bld: 107 mg/dL — ABNORMAL HIGH (ref 70–99)
Potassium: 3.8 mmol/L (ref 3.5–5.1)
Sodium: 142 mmol/L (ref 135–145)
Total Bilirubin: 0.3 mg/dL (ref 0.3–1.2)
Total Protein: 7.1 g/dL (ref 6.5–8.1)

## 2020-12-29 LAB — LACTATE DEHYDROGENASE: LDH: 171 U/L (ref 98–192)

## 2020-12-29 LAB — HEMOGLOBIN A1C
Hgb A1c MFr Bld: 6 % — ABNORMAL HIGH (ref 4.8–5.6)
Mean Plasma Glucose: 125.5 mg/dL

## 2020-12-29 NOTE — Progress Notes (Signed)
Hematology and Oncology Follow Up Visit  Jennifer [name] [id number] 11-11-1950 70 y.o. 12/29/2020   Principle Diagnosis:  Recurrent pulmonary embolism   Current Therapy:        Xarelto 5 mg po q day - lifelong   Interim History:  Jennifer [name] is here today for follow-up.  She is getting over a bout of bronchitis.  She is a few weeks ago.  I think she is on some steroids and some antibiotics.  She says she is starting to feel better.  She is going have a colonoscopy in about 3 weeks.  I told her to stop the Xarelto 2 days before the colonoscopy and restart the day afterwards.  She has had no problems with bleeding.  There is no chest wall pain.  She has had no nausea or vomiting.  She has had no fever.  She has had no rashes.  There is been no leg swelling.  Thankfully, there has been no issues with COVID.  Currently, I would say performance status is probably ECOG 1.     Medications:  Allergies as of 12/29/2020       Reactions   Morphine And Related Anaphylaxis, Shortness Of Breath   Sulfonamide Derivatives Hives   Burn from inside out   Promethazine Hcl Other (See Comments)   Hallucinations        Medication List        Accurate as of December 29, 2020  2:34 PM. If you have any questions, ask your nurse or doctor.          acetaminophen 500 MG tablet Commonly known as: TYLENOL Take 1 tablet (500 mg total) by mouth every 6 (six) hours as needed.   Aciphex 20 MG tablet Generic drug: RABEprazole TAKE ONE TABLET BY MOUTH TWICE DAILY   AMBULATORY NON FORMULARY MEDICATION Medication Name: Nitroglycerin ointment 0.125% use small pea sized amount per rectum three times a day for 6-8 weeks   azithromycin 250 MG tablet Commonly known as: ZITHROMAX Take 2 tablets by mouth first day, then 1 tablet for 4 additional days   benzonatate 200 MG capsule Commonly known as: TESSALON TAKE 1 CAPSULE BY MOUTH 3 TIMES A DAY AS NEEDED FOR COUGH   dicyclomine 20 MG tablet Commonly  known as: BENTYL Take 1 tablet (20 mg total) by mouth every 8 (eight) hours as needed for spasms.   escitalopram 10 MG tablet Commonly known as: LEXAPRO TAKE ONE TABLET BY MOUTH ONE TIME DAILY   glucose blood test strip Check blood sugar once daily   hydrocortisone valerate cream 0.2 % Commonly known as: WESTCORT Apply 1 application topically daily as needed (eczema). eczema   hyoscyamine 0.125 MG SL tablet Commonly known as: LEVSIN SL DISSOLVE ONE TABLET IN MOUTH EVERY 4 HOURS AS NEEDED   multivitamin with minerals tablet Take 1 tablet by mouth daily. Rainbow light   ondansetron 8 MG tablet Commonly known as: ZOFRAN Take 1 tablet by mouth as needed.   OneTouch Delica Lancets [id number] Misc Check blood sugar once daily   Plenvu 140 g Solr Generic drug: PEG-KCl-NaCl-NaSulf-Na Asc-C For colon prep as directed by doctors office   prednisoLONE acetate 1 % ophthalmic suspension Commonly known as: PRED FORTE Place 1 drop into both eyes as needed.   predniSONE 10 MG tablet Commonly known as: DELTASONE 3 tabs x 3 days, 2 tabs x 3 days, 1 tab x 3 days   Prevagen 10 MG Caps Generic drug: Apoaequorin Take 10 mg by  mouth daily. prevagen   Restasis 0.05 % ophthalmic emulsion Generic drug: cycloSPORINE Restasis 0.05 % eye drops in a dropperette  INSTILL 1 DROP INTO EACH EYE TWICE DAILY   sucralfate 1 g tablet Commonly known as: Carafate Take 1 gram by mouth three times daily before meals PHARMACY-PLEASE HAVE PATIENT MAKE INTO A SLURRY   temazepam 30 MG capsule Commonly known as: RESTORIL TAKE ONE CAPSULE BY MOUTH AT BEDTIME AS NEEDED FOR SLEEP   triamterene-hydrochlorothiazide 37.5-25 MG tablet Commonly known as: MAXZIDE-25 Take 0.5 tablets by mouth daily.   Xarelto 2.5 MG Tabs tablet Generic drug: rivaroxaban TAKE TWO TABLETS BY MOUTH DAILY        Allergies:  Allergies  Allergen Reactions   Morphine And Related Anaphylaxis and Shortness Of Breath   Sulfonamide  Derivatives Hives    Burn from inside out   Promethazine Hcl Other (See Comments)    Hallucinations    Past Medical History, Surgical history, Social history, and Family History were reviewed and updated.  Review of Systems: Review of Systems  Constitutional: Negative.   HENT: Negative.    Eyes: Negative.   Respiratory: Negative.    Cardiovascular: Negative.   Gastrointestinal: Negative.   Genitourinary: Negative.   Musculoskeletal:  Positive for joint pain.  Skin: Negative.   Neurological: Negative.   Endo/Heme/Allergies: Negative.   Psychiatric/Behavioral: Negative.      Physical Exam:  weight is 261 lb (118.4 kg). Her oral temperature is 98.3 F (36.8 C). Her blood pressure is 147/65 (abnormal) and her pulse is 71. Her respiration is 16 and oxygen saturation is 99%.   Wt Readings from Last 3 Encounters:  12/29/20 261 lb (118.4 kg)  12/22/20 256 lb (116.1 kg)  12/21/20 256 lb (116.1 kg)    Physical Exam Vitals reviewed.  HENT:     Head: Normocephalic and atraumatic.  Eyes:     Pupils: Pupils are equal, round, and reactive to light.  Cardiovascular:     Rate and Rhythm: Normal rate and regular rhythm.     Heart sounds: Normal heart sounds.  Pulmonary:     Effort: Pulmonary effort is normal.     Breath sounds: Normal breath sounds.  Abdominal:     General: Bowel sounds are normal.     Palpations: Abdomen is soft.  Musculoskeletal:        General: No tenderness or deformity. Normal range of motion.     Cervical back: Normal range of motion.  Lymphadenopathy:     Cervical: No cervical adenopathy.  Skin:    General: Skin is warm and dry.     Findings: No erythema or rash.  Neurological:     Mental Status: She is alert and oriented to person, place, and time.  Psychiatric:        Behavior: Behavior normal.        Thought Content: Thought content normal.        Judgment: Judgment normal.     Lab Results  Component Value Date   WBC 8.2 12/29/2020   HGB  11.6 (L) 12/29/2020   HCT 35.3 (L) 12/29/2020   MCV 87.4 12/29/2020   PLT 301 12/29/2020   Lab Results  Component Value Date   FERRITIN 165 08/13/2017   IRON 60 08/13/2017   TIBC 260 08/13/2017   UIBC 200 08/13/2017   IRONPCTSAT 23 08/13/2017   Lab Results  Component Value Date   RBC 4.04 12/29/2020   No results found for: KPAFRELGTCHN, LAMBDASER, KAPLAMBRATIO No results  found for: Kandis Cocking, IGMSERUM No results found for: [name], SPEI   Chemistry      Component Value Date/Time   NA 142 12/29/2020 1355   NA 143 02/16/2017 1450   NA 142 01/19/2016 1141   K 3.8 12/29/2020 1355   K 3.7 02/16/2017 1450   K 3.7 01/19/2016 1141   CL 103 12/29/2020 1355   CL 105 02/16/2017 1450   CO2 31 12/29/2020 1355   CO2 32 02/16/2017 1450   CO2 27 01/19/2016 1141   BUN 22 12/29/2020 1355   BUN 14 02/16/2017 1450   BUN 19.5 01/19/2016 1141   CREATININE 0.94 12/29/2020 1355   CREATININE 1.2 02/16/2017 1450   CREATININE 0.9 01/19/2016 1141      Component Value Date/Time   CALCIUM 9.0 12/29/2020 1355   CALCIUM 8.8 02/16/2017 1450   CALCIUM 8.8 01/19/2016 1141   ALKPHOS 55 12/29/2020 1355   ALKPHOS 57 02/16/2017 1450   ALKPHOS 59 01/19/2016 1141   AST 18 12/29/2020 1355   AST 21 01/19/2016 1141   ALT 23 12/29/2020 1355   ALT 27 02/16/2017 1450   ALT 18 01/19/2016 1141   BILITOT 0.3 12/29/2020 1355   BILITOT 0.31 01/19/2016 1141       Impression and Plan: Jennifer [name] is a very pleasant 70 yo African American female with history of recurrent PE.   Her initial thrombus was diagnosed in 2012 and then recurred in 2015.   She is on lifelong anticoagulation with Xarelto 5 mg PO daily and tolerating well.   I am glad that she got through this bout of bronchitis.  It sounds like this is almost over.  I think she is on a few more days of steroids.  I do not see any problems with her having the colonoscopy.  I am sure that  the colonoscopy should turn out okay.  I think we can now get her through the holiday season.  I will have her come back next year.   [name], MD 9/7/20222:34 PM

## 2020-12-29 NOTE — Telephone Encounter (Signed)
Appts made per 12/29/20 los, pt req to view in [employer]

## 2020-12-31 ENCOUNTER — Encounter: Payer: Medicare Other | Admitting: Gastroenterology

## 2021-01-05 ENCOUNTER — Encounter: Payer: Self-pay | Admitting: Family

## 2021-01-17 ENCOUNTER — Other Ambulatory Visit: Payer: Self-pay

## 2021-01-17 ENCOUNTER — Ambulatory Visit (AMBULATORY_SURGERY_CENTER): Payer: Medicare Other | Admitting: Gastroenterology

## 2021-01-17 ENCOUNTER — Encounter: Payer: Self-pay | Admitting: Gastroenterology

## 2021-01-17 VITALS — BP 141/77 | HR 61 | Temp 96.8°F | Resp 16 | Ht 68.0 in | Wt 256.0 lb

## 2021-01-17 DIAGNOSIS — K602 Anal fissure, unspecified: Secondary | ICD-10-CM

## 2021-01-17 DIAGNOSIS — K625 Hemorrhage of anus and rectum: Secondary | ICD-10-CM

## 2021-01-17 DIAGNOSIS — K589 Irritable bowel syndrome without diarrhea: Secondary | ICD-10-CM | POA: Diagnosis not present

## 2021-01-17 DIAGNOSIS — I10 Essential (primary) hypertension: Secondary | ICD-10-CM | POA: Diagnosis not present

## 2021-01-17 DIAGNOSIS — G4733 Obstructive sleep apnea (adult) (pediatric): Secondary | ICD-10-CM | POA: Diagnosis not present

## 2021-01-17 DIAGNOSIS — K6289 Other specified diseases of anus and rectum: Secondary | ICD-10-CM | POA: Diagnosis not present

## 2021-01-17 DIAGNOSIS — D123 Benign neoplasm of transverse colon: Secondary | ICD-10-CM

## 2021-01-17 DIAGNOSIS — Z86010 Personal history of colonic polyps: Secondary | ICD-10-CM

## 2021-01-17 DIAGNOSIS — M797 Fibromyalgia: Secondary | ICD-10-CM | POA: Diagnosis not present

## 2021-01-17 MED ORDER — SODIUM CHLORIDE 0.9 % IV SOLN: 500.0000 mL | Freq: Once | INTRAVENOUS | Status: DC

## 2021-01-17 NOTE — Progress Notes (Signed)
DT VS 

## 2021-01-17 NOTE — Op Note (Signed)
[facility] Patient Name: Jennifer [name] Procedure Date: 01/17/2021 2:23 PM MRN: [id number] Endoscopist: Mauri Pole , MD Age: 70 Referring MD:  Date of Birth: June 19, 1950 Gender: Female Account #: [id number] Procedure:                Colonoscopy Indications:              High risk colon cancer surveillance: Personal                            history of colonic polyps, Incidental anal fissure                            and rectal pain noted Medicines:                Monitored Anesthesia Care Procedure:                Pre-Anesthesia Assessment:                           - Prior to the procedure, a History and Physical                            was performed, and patient medications and                            allergies were reviewed. The patient's tolerance of                            previous anesthesia was also reviewed. The risks                            and benefits of the procedure and the sedation                            options and risks were discussed with the patient.                            All questions were answered, and informed consent                            was obtained. Prior Anticoagulants: The patient                            last took Xarelto (rivaroxaban) 2 days prior to the                            procedure. ASA Grade Assessment: III - A patient                            with severe systemic disease. After reviewing the                            risks and benefits, the patient was deemed in  satisfactory condition to undergo the procedure.                           After obtaining informed consent, the colonoscope                            was passed under direct vision. Throughout the                            procedure, the patient's blood pressure, pulse, and                            oxygen saturations were monitored continuously. The                            Olympus PCF-H190DL [id number] [id number])  Colonoscope was                            introduced through the anus and advanced to the the                            cecum, identified by appendiceal orifice and                            ileocecal valve. The colonoscopy was performed                            without difficulty. The patient tolerated the                            procedure well. The quality of the bowel                            preparation was excellent. The ileocecal valve,                            appendiceal orifice, and rectum were photographed. Scope In: 2:35:27 PM Scope Out: 2:55:07 PM Scope Withdrawal Time: 0 hours 11 minutes 25 seconds  Total Procedure Duration: 0 hours 19 minutes 40 seconds  Findings:                 The perianal and digital rectal examinations were                            normal.                           A 4 mm polyp was found in the transverse colon. The                            polyp was sessile. The polyp was removed with a                            cold snare. Resection and retrieval were complete.  Noted oozing of blood. To prevent bleeding after                            the polypectomy, one hemostatic clip was                            successfully placed (MR conditional). There was no                            bleeding at the end of the procedure.                           Scattered small-mouthed diverticula were found in                            the sigmoid colon, descending colon, transverse                            colon and ascending colon.                           Non-bleeding external and internal hemorrhoids were                            found during retroflexion. The hemorrhoids were                            small.                           A less than 5 mm anal fissure was found in the anal                            canal. Complications:            No immediate complications. Estimated Blood Loss:     Estimated blood  loss was minimal. Impression:               - One 4 mm polyp in the transverse colon, removed                            with a cold snare. Resected and retrieved. Clip (MR                            conditional) was placed.                           - Mild diverticulosis in the sigmoid colon, in the                            descending colon, in the transverse colon and in                            the ascending colon.                           -  Non-bleeding external and internal hemorrhoids.                           - Anal fissure. Recommendation:           - Patient has a contact number available for                            emergencies. The signs and symptoms of potential                            delayed complications were discussed with the                            patient. Return to normal activities tomorrow.                            Written discharge instructions were provided to the                            patient.                           - Resume previous diet.                           - Continue present medications.                           - Await pathology results.                           - Repeat colonoscopy in 5-10 years for surveillance                            based on pathology results.                           - Use Nitroglycerine cream per rectum as previously                            prescribed                           - Return to GI clinic in 2 months. Please call to                            schedule appointment                           - Resume Xarelto (rivaroxaban) at prior dose in 2                            days. Refer to managing physician for further                            adjustment of therapy. Mauri Pole,  MD 01/17/2021 3:02:38 PM This report has been signed electronically.

## 2021-01-17 NOTE — Patient Instructions (Signed)
Please read handouts provided. Continue present medications. Await pathology results. Resume Xarelto ( rivaroxaban ) at prior dose in 2 days. Return to GI clinic in 2 months. Please call to schedule appointment. Use Nitroglycerine cream per rectum as previously prescribed.   YOU HAD AN ENDOSCOPIC PROCEDURE TODAY AT [facility]:   Refer to the procedure report that was given to you for any specific questions about what was found during the examination.  If the procedure report does not answer your questions, please call your gastroenterologist to clarify.  If you requested that your care partner not be given the details of your procedure findings, then the procedure report has been included in a sealed envelope for you to review at your convenience later.  YOU SHOULD EXPECT: Some feelings of bloating in the abdomen. Passage of more gas than usual.  Walking can help get rid of the air that was put into your GI tract during the procedure and reduce the bloating. If you had a lower endoscopy (such as a colonoscopy or flexible sigmoidoscopy) you may notice spotting of blood in your stool or on the toilet paper. If you underwent a bowel prep for your procedure, you may not have a normal bowel movement for a few days.  Please Note:  You might notice some irritation and congestion in your nose or some drainage.  This is from the oxygen used during your procedure.  There is no need for concern and it should clear up in a day or so.  SYMPTOMS TO REPORT IMMEDIATELY:  Following lower endoscopy (colonoscopy or flexible sigmoidoscopy):  Excessive amounts of blood in the stool  Significant tenderness or worsening of abdominal pains  Swelling of the abdomen that is new, acute  Fever of 100F or higher   For urgent or emergent issues, a gastroenterologist can be reached at any hour by calling [phone]. Do not use MyChart messaging for urgent concerns.    DIET:  We do recommend a  small meal at first, but then you may proceed to your regular diet.  Drink plenty of fluids but you should avoid alcoholic beverages for 24 hours.  ACTIVITY:  You should plan to take it easy for the rest of today and you should NOT DRIVE or use heavy machinery until tomorrow (because of the sedation medicines used during the test).    FOLLOW UP: Our staff will call the number listed on your records 48-72 hours following your procedure to check on you and address any questions or concerns that you may have regarding the information given to you following your procedure. If we do not reach you, we will leave a message.  We will attempt to reach you two times.  During this call, we will ask if you have developed any symptoms of COVID 19. If you develop any symptoms (ie: fever, flu-like symptoms, shortness of breath, cough etc.) before then, please call [phone].  If you test positive for Covid 19 in the 2 weeks post procedure, please call and report this information to [place].    If any biopsies were taken you will be contacted by phone or by letter within the next 1-3 weeks.  Please call us at [phone] if you have not heard about the biopsies in 3 weeks.    SIGNATURES/CONFIDENTIALITY: You and/or your care partner have signed paperwork which will be entered into your electronic medical record.  These signatures attest to the fact that that the information above on your After  Visit Summary has been reviewed and is understood.  Full responsibility of the confidentiality of this discharge information lies with you and/or your care-partner.

## 2021-01-17 NOTE — Progress Notes (Signed)
A and O x3. Report to RN. Tolerated MAC anesthesia well. 

## 2021-01-17 NOTE — Progress Notes (Signed)
Please refer to office visit note 12/21/20. No additional changes in H&P Patient is appropriate for planned procedure(s) and anesthesia in an ambulatory setting  K. Denzil Magnuson , MD [phone]

## 2021-01-19 ENCOUNTER — Telehealth: Payer: Self-pay | Admitting: *Deleted

## 2021-01-19 ENCOUNTER — Telehealth: Payer: Self-pay

## 2021-01-19 NOTE — Telephone Encounter (Signed)
  Follow up Call-  Call back number 01/17/2021 04/15/2020  Post procedure Call Back phone  # [phone] hm [phone]  Permission to leave phone message Yes Yes  Some recent data might be hidden     Patient questions:  Do you have a fever, pain , or abdominal swelling? No. Pain Score  0 *  Have you tolerated food without any problems? Yes.    Have you been able to return to your normal activities? Yes.    Do you have any questions about your discharge instructions: Diet   No. Medications  No. Follow up visit  No.  Do you have questions or concerns about your Care? No.  Actions: * If pain score is 4 or above: No action needed, pain <4.  Have you developed a fever since your procedure? no  2.   Have you had an respiratory symptoms (SOB or cough) since your procedure? no  3.   Have you tested positive for COVID 19 since your procedure no  4.   Have you had any family members/close contacts diagnosed with the COVID 19 since your procedure?  no   If yes to any of these questions please route to Joylene John, RN and Joella Prince, RN

## 2021-01-19 NOTE — Telephone Encounter (Signed)
Left message on answering.

## 2021-01-19 NOTE — Telephone Encounter (Signed)
Attempted 2nd f/u phone call. No answer. Left message.  °

## 2021-01-20 ENCOUNTER — Encounter: Payer: Self-pay | Admitting: Gastroenterology

## 2021-01-25 ENCOUNTER — Encounter: Payer: Self-pay | Admitting: Gastroenterology

## 2021-01-28 ENCOUNTER — Other Ambulatory Visit: Payer: Self-pay | Admitting: Internal Medicine

## 2021-01-28 DIAGNOSIS — Z1231 Encounter for screening mammogram for malignant neoplasm of breast: Secondary | ICD-10-CM

## 2021-02-16 ENCOUNTER — Ambulatory Visit (INDEPENDENT_AMBULATORY_CARE_PROVIDER_SITE_OTHER): Payer: Medicare Other | Admitting: Gastroenterology

## 2021-02-16 ENCOUNTER — Other Ambulatory Visit (INDEPENDENT_AMBULATORY_CARE_PROVIDER_SITE_OTHER): Payer: Medicare Other

## 2021-02-16 ENCOUNTER — Encounter: Payer: Self-pay | Admitting: Gastroenterology

## 2021-02-16 VITALS — BP 140/82 | HR 79 | Ht 68.0 in | Wt 263.1 lb

## 2021-02-16 DIAGNOSIS — K625 Hemorrhage of anus and rectum: Secondary | ICD-10-CM

## 2021-02-16 DIAGNOSIS — K602 Anal fissure, unspecified: Secondary | ICD-10-CM

## 2021-02-16 DIAGNOSIS — K219 Gastro-esophageal reflux disease without esophagitis: Secondary | ICD-10-CM

## 2021-02-16 DIAGNOSIS — K582 Mixed irritable bowel syndrome: Secondary | ICD-10-CM | POA: Diagnosis not present

## 2021-02-16 DIAGNOSIS — D500 Iron deficiency anemia secondary to blood loss (chronic): Secondary | ICD-10-CM

## 2021-02-16 LAB — IBC + FERRITIN
Ferritin: 73.1 ng/mL (ref 10.0–291.0)
Iron: 59 ug/dL (ref 42–145)
Saturation Ratios: 18.9 % — ABNORMAL LOW (ref 20.0–50.0)
TIBC: 312.2 ug/dL (ref 250.0–450.0)
Transferrin: 223 mg/dL (ref 212.0–360.0)

## 2021-02-16 LAB — HEMATOCRIT: HCT: 36.5 % (ref 36.0–46.0)

## 2021-02-16 LAB — HEMOGLOBIN: Hemoglobin: 12 g/dL (ref 12.0–15.0)

## 2021-02-16 MED ORDER — AMBULATORY NON FORMULARY MEDICATION: 1 refills | Status: DC

## 2021-02-16 NOTE — Patient Instructions (Signed)
We have given you a new prescription of Nitroglycerin ointment  Your provider has requested that you go to the basement level for lab work before leaving today. Press "B" on the elevator. The lab is located at the first door on the left as you exit the elevator.   Take Benefiber 1 tablespoon twice a day with meals  START oral Cheleted iron 1 capsule daily OTC   START stool softener 1 capsule daily (Docusate) OTC   Due to recent changes in healthcare laws, you may see the results of your imaging and laboratory studies on MyChart before your provider has had a chance to review them.  We understand that in some cases there may be results that are confusing or concerning to you. Not all laboratory results come back in the same time frame and the provider may be waiting for multiple results in order to interpret others.  Please give [place] 48 hours in order for your provider to thoroughly review all the results before contacting the office for clarification of your results.    If you are age 54 or older, your body mass index should be between 23-30. Your Body mass index is 40.01 kg/m. If this is out of the aforementioned range listed, please consider follow up with your Primary Care Provider.  If you are age 20 or younger, your body mass index should be between 19-25. Your Body mass index is 40.01 kg/m. If this is out of the aformentioned range listed, please consider follow up with your Primary Care Provider.   ________________________________________________________  The [facility] providers would like to encourage you to use [facility] to communicate with providers for non-urgent requests or questions.  Due to long hold times on the telephone, sending your provider a message by [facility] may be a faster and more efficient way to get a response.  Please allow 48 business hours for a response.  Please remember that this is for non-urgent requests.  _______________________________________________________    I appreciate the  opportunity to care for you  Thank You   Harl Bowie , MD

## 2021-02-16 NOTE — Progress Notes (Signed)
Jennifer [name]    [id number]    Jul 12, 1950  Primary Care Physician:Paz, Alda Berthold, MD  Referring Physician: Colon Branch, MD [address] [place],  [place] [address]   Chief complaint:  Rectal bleeding, rectal pain  HPI:  70 yr old very pleasant F with complaints of rectal bleeding and change in bowel habits  Colonoscopy January 17, 2021 - One 4 mm polyp in the transverse colon, removed with a cold snare. Resected and retrieved. Clip (MR conditional) was placed. - Mild diverticulosis in the sigmoid colon, in the descending colon, in the transverse colon and in the ascending colon. - Non-bleeding external and internal hemorrhoids. - Anal fissure.  She is using nitroglycerin per rectum, feels the rectal pain and bleeding is improving but she continues to have on and off episodes though not as bad as before  She is experiencing intermittent abdominal pain and discomfort with excess gas   Colonoscopy December 14, 2015: She had removal of small sessile polyps, one SSP and one tubular adenoma.  Due for recall colonoscopy August 2022   She has history of chronic dysphagia, s/p Nissen fundoplication X3   Esophageal manometry August 06, 2019: Findings suggestive of EG junction outflow obstruction with intact peristalsis, negative for achalasia.   EGD September 28, 2016: It is post Nissen fundoplication with intact wrap, dilated with TTS balloon to 18 mm    She is on chronic anticoagulation for DVT and PE   Outpatient Encounter Medications as of 02/16/2021  Medication Sig   acetaminophen (TYLENOL) 500 MG tablet Take 1 tablet (500 mg total) by mouth every 6 (six) hours as needed.   ACIPHEX 20 MG tablet TAKE ONE TABLET BY MOUTH TWICE DAILY   AMBULATORY NON FORMULARY MEDICATION Medication Name: Nitroglycerin ointment 0.125% use small pea sized amount per rectum three times a day for 6-8 weeks   Apoaequorin (PREVAGEN) 10 MG CAPS Take 10 mg by mouth daily. prevagen    cycloSPORINE (RESTASIS) 0.05 % ophthalmic emulsion Restasis 0.05 % eye drops in a dropperette  INSTILL 1 DROP INTO EACH EYE TWICE DAILY   dicyclomine (BENTYL) 20 MG tablet Take 1 tablet (20 mg total) by mouth every 8 (eight) hours as needed for spasms.   glucose blood test strip Check blood sugar once daily   hydrocortisone valerate cream (WESTCORT) 0.2 % Apply 1 application topically daily as needed (eczema). eczema   hyoscyamine (LEVSIN SL) 0.125 MG SL tablet DISSOLVE ONE TABLET IN MOUTH EVERY 4 HOURS AS NEEDED   Multiple Vitamins-Minerals (MULTIVITAMIN WITH MINERALS) tablet Take 1 tablet by mouth daily. Rainbow light   ondansetron (ZOFRAN) 8 MG tablet Take 1 tablet by mouth as needed.   ONETOUCH DELICA LANCETS 19Q MISC Check blood sugar once daily   prednisoLONE acetate (PRED FORTE) 1 % ophthalmic suspension Place 1 drop into both eyes as needed.   sucralfate (CARAFATE) 1 g tablet Take 1 gram by mouth three times daily before meals PHARMACY-PLEASE HAVE PATIENT MAKE INTO A SLURRY   temazepam (RESTORIL) 30 MG capsule TAKE ONE CAPSULE BY MOUTH AT BEDTIME AS NEEDED FOR SLEEP   triamterene-hydrochlorothiazide (MAXZIDE-25) 37.5-25 MG tablet Take 0.5 tablets by mouth daily.   XARELTO 2.5 MG TABS tablet TAKE TWO TABLETS BY MOUTH DAILY   [DISCONTINUED] escitalopram (LEXAPRO) 10 MG tablet TAKE ONE TABLET BY MOUTH ONE TIME DAILY   No facility-administered encounter medications on file as of 02/16/2021.  Allergies as of 02/16/2021 - Review Complete 02/16/2021  Allergen Reaction Noted   Morphine and related Anaphylaxis and Shortness Of Breath 12/01/2013   Sulfonamide derivatives Hives 12/02/2018   Promethazine hcl Other (See Comments) 12/02/2018    Past Medical History:  Diagnosis Date   Allergy    Anemia    Arthritis    Cataract    bil cataracts removed   Chronic chest pain    Chronic fatigue 02/03/2017   Clotting disorder [facility])    Pulmonary Embolis 2010 & 2015   Colon polyps     inflamed partially ulcerated hyperplastic polyp   Diabetes ([place]) 09/24/2013   no meds   Diverticulosis    Dysphagia esophageal phase - chronic after 3 fundoplications 16/01/9603   Esophageal stenosis    Stricture after fundoplication REDO   Fibromyalgia    GERD (gastroesophageal reflux disease)    Glaucoma suspect    Heart murmur    no problems    Hemorrhoids, internal, with bleeding 06/27/2017   History of hiatal hernia    Hypertension    IBS (irritable bowel syndrome)    Mitral valve prolapse    OSA (obstructive sleep apnea)    on CPAP-Mild - Sleep study 2004   Pulmonary embolus ([place])    2010 and 12-2013   Sleep apnea    wears CPAP   Thyroid nodule      dr. watching    Tinnitus    Subjective   Zoster 2014   R flank    Past Surgical History:  Procedure Laterality Date   ABDOMINAL HYSTERECTOMY     still has 1 ovary    APPENDECTOMY     BRAIN SURGERY  1995   Stem surgery, Arnold Chiari Malformation   BREAST REDUCTION SURGERY     CATARACT EXTRACTION Bilateral 2017   cataracts, Dr. Herbert Deaner   CHOLECYSTECTOMY     COLONOSCOPY     ESOPHAGEAL MANOMETRY N/A 08/06/2019   Procedure: ESOPHAGEAL MANOMETRY (EM);  Surgeon: Gatha Mayer, MD;  Location: [facility];  Service: Endoscopy;  Laterality: N/A;   HEMORRHOID BANDING     HERNIA REPAIR     Hital Hernia   KNEE ARTHROSCOPY  05/16/2012   Procedure: ARTHROSCOPY KNEE;  Surgeon: Sharmon Revere, MD;  Location: [place];  Service: Orthopedics;  Laterality: Left;   KNEE ARTHROSCOPY Right 54/0981   NISSEN FUNDOPLICATION     X 3 lab, open x 2 last with mesh   POLYPECTOMY     REDUCTION MAMMAPLASTY Bilateral 2007   TOTAL KNEE ARTHROPLASTY Right 08/25/2019   Procedure: RIGHT TOTAL KNEE ARTHROPLASTY;  Surgeon: Gaynelle Arabian, MD;  Location: [facility];  Service: Orthopedics;  Laterality: Right;  28min   UPPER GASTROINTESTINAL ENDOSCOPY      Family History  Problem Relation Age of Onset   Diabetes Mother    Rheum arthritis Mother     Hypertension Mother    Hypertension Father    Rheum arthritis Sister    Hypertension Sister    Rheum arthritis Sister    Rheum arthritis Sister    Rheum arthritis Sister    CAD Sister        MI  dx age 80   Crohn's disease Maternal Aunt    Stomach cancer Maternal Uncle    Colon cancer Maternal Grandmother        dx with colon ca laster in life - lived to be 8   Breast cancer Neg Hx    Esophageal cancer Neg Hx  Rectal cancer Neg Hx    Prostate cancer Neg Hx    Pancreatic cancer Neg Hx    Colon polyps Neg Hx     Social History   Socioeconomic History   Marital status: Married    Spouse name: Not on file   Number of children: 1   Years of education: Not on file   Highest education level: Not on file  Occupational History   Occupation: retired form the [employer] 04-2015    Employer: [employer]  Tobacco Use   Smoking status: Never   Smokeless tobacco: Never   Tobacco comments:    never used tobacco  Vaping Use   Vaping Use: Never used  Substance and Sexual Activity   Alcohol use: No    Alcohol/week: 0.0 standard drinks   Drug use: No   Sexual activity: Not Currently    Birth control/protection: None  Other Topics Concern   Not on file  Social History Narrative   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   Physical Exam: Vitals:   02/16/21 0939  BP: 140/82  Pulse: 79   Body mass index is 40.01 kg/m. Gen:      No acute  distress HEENT:  sclera anicteric Abd:      soft, non-tender; no palpable masses, no distension Ext:    No edema Neuro: alert and oriented x 3 Psych: normal mood and affect  Data Reviewed:  Reviewed labs, radiology imaging, old records and pertinent past GI work up   Assessment and Plan/Recommendations:  70 year old female with history of chronic GERD, s/p Nissen fundoplication, history of adenomatous colon polyps with complaints of rectal bleeding and change in bowel habits secondary to anal fissure   Continue 0.125% nitroglycerin with RectiCare small pea-sized amount per rectum 3 times daily. Use Benefiber 1 tablespoon twice daily with meals  Use stool softener docusate daily at bedtime to prevent constipation  Iron deficiency secondary to GI blood loss Follow-up CBC and iron panel Start taking oral chelated iron 1 capsule daily  GERD: Continue Aciphex and antireflux measures  Return in 2 to 3 months  This visit required 40 minutes of patient care (this includes precharting, chart review, review of results, face-to-face time used for counseling as well as treatment plan and follow-up. The patient was provided an opportunity to ask questions and all were answered. The patient agreed with the plan and demonstrated an understanding of the instructions.  Damaris Hippo , MD    CC: Colon Branch, MD

## 2021-02-20 ENCOUNTER — Other Ambulatory Visit: Payer: Self-pay | Admitting: Internal Medicine

## 2021-02-24 ENCOUNTER — Encounter: Payer: Self-pay | Admitting: Gastroenterology

## 2021-03-04 ENCOUNTER — Other Ambulatory Visit: Payer: Self-pay

## 2021-03-04 ENCOUNTER — Ambulatory Visit
Admission: RE | Admit: 2021-03-04 | Discharge: 2021-03-04 | Disposition: A | Discharge status: Home / Self Care | Payer: Medicare Other | Source: Ambulatory Visit | Attending: Internal Medicine | Admitting: Internal Medicine

## 2021-03-04 DIAGNOSIS — Z1231 Encounter for screening mammogram for malignant neoplasm of breast: Secondary | ICD-10-CM | POA: Diagnosis not present

## 2021-03-10 ENCOUNTER — Encounter: Payer: Self-pay | Admitting: Pulmonary Disease

## 2021-03-10 ENCOUNTER — Ambulatory Visit (INDEPENDENT_AMBULATORY_CARE_PROVIDER_SITE_OTHER): Payer: Medicare Other | Admitting: Pulmonary Disease

## 2021-03-10 ENCOUNTER — Other Ambulatory Visit: Payer: Self-pay

## 2021-03-10 DIAGNOSIS — K222 Esophageal obstruction: Secondary | ICD-10-CM | POA: Diagnosis not present

## 2021-03-10 DIAGNOSIS — G4710 Hypersomnia, unspecified: Secondary | ICD-10-CM | POA: Diagnosis not present

## 2021-03-10 DIAGNOSIS — R053 Chronic cough: Secondary | ICD-10-CM | POA: Diagnosis not present

## 2021-03-10 DIAGNOSIS — G4730 Sleep apnea, unspecified: Secondary | ICD-10-CM

## 2021-03-10 MED ORDER — BENZONATATE 100 MG PO CAPS: 200.0000 mg | ORAL_CAPSULE | Freq: Two times a day (BID) | ORAL | 1 refills | Status: DC | PRN

## 2021-03-10 MED ORDER — AMOXICILLIN-POT CLAVULANATE 875-125 MG PO TABS: 1.0000 | ORAL_TABLET | Freq: Two times a day (BID) | ORAL | 0 refills | Status: AC

## 2021-03-10 NOTE — Patient Instructions (Signed)
  We discussed anti-reflux measures - small bites , small meals, chopped food, upright position when sleeping   Augmentin 875 bid x 7 days  Delsym OTC 5 ml twice daily as needed Benzonatate perles 200 mg twice daily as needed  Check with DME about CPAP 'leak'

## 2021-03-10 NOTE — Assessment & Plan Note (Signed)
Current CPAP settings of auto 12 to 20 cm seem adequate. She is having a lot of problem with compliance due to her ongoing cough  Weight loss encouraged, compliance with goal of at least 4-6 hrs every night is the expectation. Advised against medications with sedative side effects Cautioned against driving when sleepy - understanding that sleepiness will vary on a day to day basis

## 2021-03-10 NOTE — Assessment & Plan Note (Signed)
Cough has been ongoing for at least 6 months, marginal relief with Z-Pak and prednisone. Does not appear to be cough variant asthma.  She does not have significant postnasal drip symptoms she does have minimal green sputum so I will give her a course of Augmentin to treat sinusitis but I really do think that this is related to her chronic dysphagia and esophageal stricture problems unfortunately this cannot be surgically corrected, her last dilatation was December 2021, I will asked GI if she is a candidate for any further procedures but more likely we may have to deal with this medically/conservatively.  She is already on Aciphex twice daily but we may be dealing with nonacid reflux here. We discussed nonpharmacological measures for reflux including upright posture when sleeping, small bites small portions etc. and she will try to be diligent about these measures

## 2021-03-10 NOTE — Assessment & Plan Note (Signed)
Prior dilatations at [facility] and at low-power in 2018 and in 2021, status post fundoplication x3. Will defer to GI if she has any other options

## 2021-03-10 NOTE — Progress Notes (Signed)
Subjective:    Patient ID: Jennifer [name], female    DOB: [birth year], 70 y.o.   MRN: [id number]  HPI  70 yo never smoker for FU of  positional obstructive sleep apnea   She has mild intermittent cough attributed to GERD - has Nisen's x 3 -last dilation 2018 by Carlean Purl  & 03/2020 (nandigam) , chronic dysphagia , manometry neg achalasia   PMH - PE in 12/2013 on Xarelto .    ED visit for laughter induced syncope.  episodes of syncope and cough -resolved   Annual follow-up visit for OSA.  She would also like to address a problem of chronic cough that has been ongoing since May 2022.  She previously had a cough that has been attributed to GERD.  She has chronic esophageal stricture last dilatation was 03/2020, she had a recent GI evaluation.  She continues to have chronic dysphagia.  Cough started in May initially was treated as URI with Z-Pak and then with a course of prednisone, now is 70% improved but still persists. Head CT 01/2020 was reviewed which shows minimal ethmoid sinus thickening. Chest x-ray 12/2020 was clear without infiltrates she denies overt reflux symptoms or postnasal drip, there is no wheezing.  Because of her coughing she has not been able to tolerate CPAP very well she still tries to use this 4 to 6 hours every night. No problems with pressure. CPAP download was reviewed which shows reasonable compliance more than 4 hours every night, reasonable control of events with residual AHI of 6/hour on auto settings 12 to 20 cm with average pressure of 16 cm and maximum pressure of 18, minimal leak   Significant tests/ events reviewed PSG 01/2003 -wt 210 lbs -TST of 250 mins incl 37 mins of REM, RDI was 15/h with lowest desaturation of 84% & moderate snoring, predominantly REM related events.  PSG 07/2009 (256 lbs )>>Mild-to-moderate obstructive sleep apnea - RDI was 17/h, non supine AHI was only 1.7/h    6/ 2011- HST AHI of 10/h with desaturation index 12/h .  psg (250 lbs) aug'11  - cpap titrated to 9 cm, small full face mask    Past Medical History:  Diagnosis Date   Allergy    Anemia    Arthritis    Cataract    bil cataracts removed   Chronic chest pain    Chronic fatigue 02/03/2017   Clotting disorder ([place])    Pulmonary Embolis 2010 & 2015   Colon polyps    inflamed partially ulcerated hyperplastic polyp   Diabetes ([place]) 09/24/2013   no meds   Diverticulosis    Dysphagia esophageal phase - chronic after 3 fundoplications 40/98/1191   Esophageal stenosis    Stricture after fundoplication REDO   Fibromyalgia    GERD (gastroesophageal reflux disease)    Glaucoma suspect    Heart murmur    no problems    Hemorrhoids, internal, with bleeding 06/27/2017   History of hiatal hernia    Hypertension    IBS (irritable bowel syndrome)    Mitral valve prolapse    OSA (obstructive sleep apnea)    on CPAP-Mild - Sleep study 2004   Pulmonary embolus ([place])    2010 and 12-2013   Sleep apnea    wears CPAP   Thyroid nodule      dr. watching    Tinnitus    Subjective   Zoster 2014   R flank     Review of Systems neg for any significant  sore throat, dysphagia, itching, sneezing, nasal congestion or excess/ purulent secretions, fever, chills, sweats, unintended wt loss, pleuritic or exertional cp, hempoptysis, orthopnea pnd or change in chronic leg swelling. Also denies presyncope, palpitations, heartburn, abdominal pain, nausea, vomiting, diarrhea or change in bowel or urinary habits, dysuria,hematuria, rash, arthralgias, visual complaints, headache, numbness weakness or ataxia.     Objective:   Physical Exam   Gen. Pleasant, obese, in no distress, normal affect ENT - no pallor,icterus, no post nasal drip, class 2-3 airway Neck: No JVD, no thyromegaly, no carotid bruits Lungs: no use of accessory muscles, no dullness to percussion, decreased without rales or rhonchi  Cardiovascular: Rhythm regular, heart sounds  normal, no murmurs or gallops, no  peripheral edema Abdomen: soft and non-tender, no hepatosplenomegaly, BS normal. Musculoskeletal: No deformities, no cyanosis or clubbing Neuro:  alert, non focal, no tremors        Assessment & Plan:

## 2021-03-18 ENCOUNTER — Telehealth: Payer: Self-pay | Admitting: Internal Medicine

## 2021-03-21 NOTE — Telephone Encounter (Signed)
Requesting: temazepam 30mg  Contract: 02/11/2020 UDS: 05/24/2020 Last Visit: 12/22/2020 Next Visit: 04/26/2021 Last Refill: 11/10/2020 #30 and 3RF  Please Advise

## 2021-03-21 NOTE — Telephone Encounter (Signed)
PDMP okay, Rx sent 

## 2021-03-22 ENCOUNTER — Telehealth: Payer: Self-pay | Admitting: Pulmonary Disease

## 2021-03-22 DIAGNOSIS — G4733 Obstructive sleep apnea (adult) (pediatric): Secondary | ICD-10-CM

## 2021-03-22 NOTE — Telephone Encounter (Signed)
Called and spoke with patient to let her know that new order has been placed and will be sent to [facility] to hopefully get her squared away with her CPAP. Patient expressed understanding. Nothing further needed at this time.

## 2021-03-28 ENCOUNTER — Encounter: Payer: Self-pay | Admitting: Gastroenterology

## 2021-03-28 ENCOUNTER — Ambulatory Visit (INDEPENDENT_AMBULATORY_CARE_PROVIDER_SITE_OTHER): Payer: Medicare Other | Admitting: Gastroenterology

## 2021-03-28 ENCOUNTER — Telehealth: Payer: Self-pay | Admitting: *Deleted

## 2021-03-28 VITALS — BP 130/78 | HR 76 | Ht 68.5 in | Wt 261.6 lb

## 2021-03-28 DIAGNOSIS — R1310 Dysphagia, unspecified: Secondary | ICD-10-CM

## 2021-03-28 DIAGNOSIS — K222 Esophageal obstruction: Secondary | ICD-10-CM

## 2021-03-28 DIAGNOSIS — K602 Anal fissure, unspecified: Secondary | ICD-10-CM | POA: Diagnosis not present

## 2021-03-28 MED ORDER — HYOSCYAMINE SULFATE 0.125 MG SL SUBL: SUBLINGUAL_TABLET | SUBLINGUAL | 3 refills | Status: DC

## 2021-03-28 MED ORDER — FAMOTIDINE 20 MG PO TABS: 20.0000 mg | ORAL_TABLET | Freq: Two times a day (BID) | ORAL | 11 refills | Status: DC

## 2021-03-28 NOTE — Patient Instructions (Signed)
You have been scheduled for an endoscopy. Please follow written instructions given to you at your visit today. If you use inhalers (even only as needed), please bring them with you on the day of your procedure.   You have been scheduled for a Barium Esophogram at [facility] Radiology (1st floor of the hospital) on _______ at ______. Please arrive 15 minutes prior to your appointment for registration. Make certain not to have anything to eat or drink 3 hours prior to your test. If you need to reschedule for any reason, please contact radiology at [phone] to do so. __________________________________________________________________ A barium swallow is an examination that concentrates on views of the esophagus. This tends to be a double contrast exam (barium and two liquids which, when combined, create a gas to distend the wall of the oesophagus) or single contrast (non-ionic iodine based). The study is usually tailored to your symptoms so a good history is essential. Attention is paid during the study to the form, structure and configuration of the esophagus, looking for functional disorders (such as aspiration, dysphagia, achalasia, motility and reflux) EXAMINATION You may be asked to change into a gown, depending on the type of swallow being performed. A radiologist and radiographer will perform the procedure. The radiologist will advise you of the type of contrast selected for your procedure and direct you during the exam. You will be asked to stand, sit or lie in several different positions and to hold a small amount of fluid in your mouth before being asked to swallow while the imaging is performed .In some instances you may be asked to swallow barium coated marshmallows to assess the motility of a solid food bolus. The exam can be recorded as a digital or video fluoroscopy procedure. POST PROCEDURE It will take 1-2 days for the barium to pass through your system. To facilitate this, it is  important, unless otherwise directed, to increase your fluids for the next 24-48hrs and to resume your normal diet.  This test typically takes about 30 minutes to perform.  Use Nitroglycerin per rectum 2-3 times daily for 3-4 weeks  Take Benefiber 1 tablespoon twice a day  Increase water intake to 8-10 cups daily   Discontinue Aciphex  Start Pepcid 20 mg twice a day   Gastroesophageal Reflux Disease, Adult Gastroesophageal reflux (GER) happens when acid from the stomach flows up into the tube that connects the mouth and the stomach (esophagus). Normally, food travels down the esophagus and stays in the stomach to be digested. However, when a person has GER, food and stomach acid sometimes move back up into the esophagus. If this becomes a more serious problem, the person may be diagnosed with a disease called gastroesophageal reflux disease (GERD). GERD occurs when the reflux: Happens often. Causes frequent or severe symptoms. Causes problems such as damage to the esophagus. When stomach acid comes in contact with the esophagus, the acid may cause inflammation in the esophagus. Over time, GERD may create small holes (ulcers) in the lining of the esophagus. What are the causes? This condition is caused by a problem with the muscle between the esophagus and the stomach (lower esophageal sphincter, or LES). Normally, the LES muscle closes after food passes through the esophagus to the stomach. When the LES is weakened or abnormal, it does not close properly, and that allows food and stomach acid to go back up into the esophagus. The LES can be weakened by certain dietary substances, medicines, and medical conditions, including: Tobacco use.  Pregnancy. Having a hiatal hernia. Alcohol use. Certain foods and beverages, such as coffee, chocolate, onions, and peppermint. What increases the risk? You are more likely to develop this condition if you: Have an increased body weight. Have a  connective tissue disorder. Take NSAIDs, such as ibuprofen. What are the signs or symptoms? Symptoms of this condition include: Heartburn. Difficult or painful swallowing and the feeling of having a lump in the throat. A bitter taste in the mouth. Bad breath and having a large amount of saliva. Having an upset or bloated stomach and belching. Chest pain. Different conditions can cause chest pain. Make sure you see your health care provider if you experience chest pain. Shortness of breath or wheezing. Ongoing (chronic) cough or a nighttime cough. Wearing away of tooth enamel. Weight loss. How is this diagnosed? This condition may be diagnosed based on a medical history and a physical exam. To determine if you have mild or severe GERD, your health care provider may also monitor how you respond to treatment. You may also have tests, including: A test to examine your stomach and esophagus with a small camera (endoscopy). A test that measures the acidity level in your esophagus. A test that measures how much pressure is on your esophagus. A barium swallow or modified barium swallow test to show the shape, size, and functioning of your esophagus. How is this treated? Treatment for this condition may vary depending on how severe your symptoms are. Your health care provider may recommend: Changes to your diet. Medicine. Surgery. The goal of treatment is to help relieve your symptoms and to prevent complications. Follow these instructions at home: Eating and drinking  Follow a diet as recommended by your health care provider. This may involve avoiding foods and drinks such as: Coffee and tea, with or without caffeine. Drinks that contain alcohol. Energy drinks and sports drinks. Carbonated drinks or sodas. Chocolate and cocoa. Peppermint and mint flavorings. Garlic and onions. Horseradish. Spicy and acidic foods, including peppers, chili powder, curry powder, vinegar, hot sauces, and  barbecue sauce. Citrus fruit juices and citrus fruits, such as oranges, lemons, and limes. Tomato-based foods, such as red sauce, chili, salsa, and pizza with red sauce. Fried and fatty foods, such as donuts, french fries, potato chips, and high-fat dressings. High-fat meats, such as hot dogs and fatty cuts of red and white meats, such as rib eye steak, sausage, ham, and bacon. High-fat dairy items, such as whole milk, butter, and cream cheese. Eat small, frequent meals instead of large meals. Avoid drinking large amounts of liquid with your meals. Avoid eating meals during the 2-3 hours before bedtime. Avoid lying down right after you eat. Do not exercise right after you eat. Lifestyle  Do not use any products that contain nicotine or tobacco. These products include cigarettes, chewing tobacco, and vaping devices, such as e-cigarettes. If you need help quitting, ask your health care provider. Try to reduce your stress by using methods such as yoga or meditation. If you need help reducing stress, ask your health care provider. If you are overweight, reduce your weight to an amount that is healthy for you. Ask your health care provider for guidance about a safe weight loss goal. General instructions Pay attention to any changes in your symptoms. Take over-the-counter and prescription medicines only as told by your health care provider. Do not take aspirin, ibuprofen, or other NSAIDs unless your health care provider told you to take these medicines. Wear loose-fitting clothing. Do not wear  anything tight around your waist that causes pressure on your abdomen. Raise (elevate) the head of your bed about 6 inches (15 cm). You can use a wedge to do this. Avoid bending over if this makes your symptoms worse. Keep all follow-up visits. This is important. Contact a health care provider if: You have: New symptoms. Unexplained weight loss. Difficulty swallowing or it hurts to swallow. Wheezing or a  persistent cough. A hoarse voice. Your symptoms do not improve with treatment. Get help right away if: You have sudden pain in your arms, neck, jaw, teeth, or back. You suddenly feel sweaty, dizzy, or light-headed. You have chest pain or shortness of breath. You vomit and the vomit is green, yellow, or black, or it looks like blood or coffee grounds. You faint. You have stool that is red, bloody, or black. You cannot swallow, drink, or eat. These symptoms may represent a serious problem that is an emergency. Do not wait to see if the symptoms will go away. Get medical help right away. Call your local emergency services (911 in the [place] Do not drive yourself to the hospital. Summary Gastroesophageal reflux happens when acid from the stomach flows up into the esophagus. GERD is a disease in which the reflux happens often, causes frequent or severe symptoms, or causes problems such as damage to the esophagus. Treatment for this condition may vary depending on how severe your symptoms are. Your health care provider may recommend diet and lifestyle changes, medicine, or surgery. Contact a health care provider if you have new or worsening symptoms. Take over-the-counter and prescription medicines only as told by your health care provider. Do not take aspirin, ibuprofen, or other NSAIDs unless your health care provider told you to do so. Keep all follow-up visits as told by your health care provider. This is important. This information is not intended to replace advice given to you by your health care provider. Make sure you discuss any questions you have with your health care provider. Document Revised: 10/20/2019 Document Reviewed: 10/20/2019 Elsevier Patient Education  [place]. _____   __________________________________________________________________________________

## 2021-03-28 NOTE — Progress Notes (Signed)
Jennifer [name]    [id number]    1951-02-24  Primary Care Physician:Paz, Alda Berthold, MD  Referring Physician: Colon Branch, MD [address] [place],  [place] [address]   Chief complaint:  Dysphagia, Rectal bleeding  HPI:  70 yr old very pleasant F with complaints of rectal bleeding, GERD and dysphagia  Rectal bleeding is improving, she feels anal fissure is healing.  She continues to have intermittent bright red blood per rectum but not same amount of bleeding she was having before She is using nitroglycerin per rectum, feels the rectal pain and bleeding is improving but she continues to have on and off episodes though not as bad as before   She is experiencing intermittent abdominal pain and discomfort with excess gas  She continues to have intermittent difficulty swallowing, fluid in her throat and also episodes of coughing.  She recently saw Dr. Elsworth Soho PCCM and the thought was exacerbation of cough was related to her esophageal dysmotility and GERD  She has history of chronic dysphagia, s/p Nissen fundoplication X3   Esophageal manometry August 06, 2019: Findings suggestive of EG junction outflow obstruction with intact peristalsis, negative for achalasia.   EGD September 28, 2016: It is post Nissen fundoplication with intact wrap, dilated with TTS balloon to 18 mm   Colonoscopy January 17, 2021 - One 4 mm polyp in the transverse colon, removed with a cold snare. Resected and retrieved. Clip (MR conditional) was placed. - Mild diverticulosis in the sigmoid colon, in the descending colon, in the transverse colon and in the ascending colon. - Non-bleeding external and internal hemorrhoids. - Anal fissure.     Colonoscopy December 14, 2015: She had removal of small sessile polyps, one SSP and one tubular adenoma.  Due for recall colonoscopy August 2022      She is on chronic anticoagulation for DVT and PE   Outpatient Encounter Medications as of 03/28/2021   Medication Sig   acetaminophen (TYLENOL) 500 MG tablet Take 1 tablet (500 mg total) by mouth every 6 (six) hours as needed.   ACIPHEX 20 MG tablet TAKE ONE TABLET BY MOUTH TWICE DAILY   AMBULATORY NON FORMULARY MEDICATION Medication Name: Nitroglycerin ointment 0.125% use small pea sized amount per rectum three times a day for 6-8 weeks   Apoaequorin (PREVAGEN) 10 MG CAPS Take 10 mg by mouth daily. prevagen   benzonatate (TESSALON) 100 MG capsule Take 2 capsules (200 mg total) by mouth 2 (two) times daily as needed for cough.   cycloSPORINE (RESTASIS) 0.05 % ophthalmic emulsion Restasis 0.05 % eye drops in a dropperette  INSTILL 1 DROP INTO EACH EYE TWICE DAILY   dicyclomine (BENTYL) 20 MG tablet Take 1 tablet (20 mg total) by mouth every 8 (eight) hours as needed for spasms.   glucose blood test strip Check blood sugar once daily   hydrocortisone valerate cream (WESTCORT) 0.2 % Apply 1 application topically daily as needed (eczema). eczema   hyoscyamine (LEVSIN SL) 0.125 MG SL tablet DISSOLVE ONE TABLET IN MOUTH EVERY 4 HOURS AS NEEDED   Multiple Vitamins-Minerals (MULTIVITAMIN WITH MINERALS) tablet Take 1 tablet by mouth daily. Rainbow light   ondansetron (ZOFRAN) 8 MG tablet Take 1 tablet by mouth as needed.   ONETOUCH DELICA LANCETS 92E MISC Check blood sugar once daily   prednisoLONE acetate (PRED FORTE) 1 % ophthalmic suspension Place 1 drop into both eyes as needed.   sucralfate (  CARAFATE) 1 g tablet Take 1 gram by mouth three times daily before meals PHARMACY-PLEASE HAVE PATIENT MAKE INTO A SLURRY   temazepam (RESTORIL) 30 MG capsule TAKE ONE CAPSULE BY MOUTH AT BEDTIME AS NEEDED FOR SLEEP   triamterene-hydrochlorothiazide (MAXZIDE-25) 37.5-25 MG tablet Take 0.5 tablets by mouth daily.   XARELTO 2.5 MG TABS tablet TAKE TWO TABLETS BY MOUTH DAILY   No facility-administered encounter medications on file as of 03/28/2021.    Allergies as of 03/28/2021 - Review Complete 03/28/2021   Allergen Reaction Noted   Morphine and related Anaphylaxis and Shortness Of Breath 12/01/2013   Sulfonamide derivatives Hives 12/02/2018   Promethazine hcl Other (See Comments) 12/02/2018    Past Medical History:  Diagnosis Date   Allergy    Anemia    Arthritis    Cataract    bil cataracts removed   Chronic chest pain    Chronic fatigue 02/03/2017   Clotting disorder [facility])    Pulmonary Embolis 2010 & 2015   Colon polyps    inflamed partially ulcerated hyperplastic polyp   Diabetes ([place]) 09/24/2013   no meds   Diverticulosis    Dysphagia esophageal phase - chronic after 3 fundoplications 00/92/3300   Esophageal stenosis    Stricture after fundoplication REDO   Fibromyalgia    GERD (gastroesophageal reflux disease)    Glaucoma suspect    Heart murmur    no problems    Hemorrhoids, internal, with bleeding 06/27/2017   History of hiatal hernia    Hypertension    IBS (irritable bowel syndrome)    Mitral valve prolapse    OSA (obstructive sleep apnea)    on CPAP-Mild - Sleep study 2004   Pulmonary embolus ([place])    2010 and 12-2013   Sleep apnea    wears CPAP   Thyroid nodule      dr. watching    Tinnitus    Subjective   [place] 2014   R flank    Past Surgical History:  Procedure Laterality Date   ABDOMINAL HYSTERECTOMY     still has 1 ovary    APPENDECTOMY     BRAIN SURGERY  1995   Stem surgery, Arnold Chiari Malformation   CATARACT EXTRACTION Bilateral 2017   cataracts, Dr. Herbert Deaner   CHOLECYSTECTOMY     COLONOSCOPY     ESOPHAGEAL MANOMETRY N/A 08/06/2019   Procedure: ESOPHAGEAL MANOMETRY (EM);  Surgeon: Gatha Mayer, MD;  Location: [facility];  Service: Endoscopy;  Laterality: N/A;   HEMORRHOID BANDING     HERNIA REPAIR     Hital Hernia   KNEE ARTHROSCOPY  05/16/2012   Procedure: ARTHROSCOPY KNEE;  Surgeon: Sharmon Revere, MD;  Location: [place];  Service: Orthopedics;  Laterality: Left;   KNEE ARTHROSCOPY Right 76/2263   NISSEN FUNDOPLICATION     X  3 lab, open x 2 last with mesh   POLYPECTOMY     REDUCTION MAMMAPLASTY Bilateral 2007   TOTAL KNEE ARTHROPLASTY Right 08/25/2019   Procedure: RIGHT TOTAL KNEE ARTHROPLASTY;  Surgeon: Gaynelle Arabian, MD;  Location: [facility];  Service: Orthopedics;  Laterality: Right;  31min   UPPER GASTROINTESTINAL ENDOSCOPY      Family History  Problem Relation Age of Onset   Diabetes Mother    Rheum arthritis Mother    Hypertension Mother    Hypertension Father    Rheum arthritis Sister    Hypertension Sister    Rheum arthritis Sister    Rheum arthritis Sister  CAD Sister        MI age 48   Fibromyalgia Sister    Colon cancer Maternal Grandmother        dx with colon ca laster in life - lived to be 105   Crohn's disease Maternal Aunt    Stomach cancer Maternal Uncle    Lung cancer Maternal Uncle    Breast cancer Neg Hx    Esophageal cancer Neg Hx    Rectal cancer Neg Hx    Prostate cancer Neg Hx    Pancreatic cancer Neg Hx    Colon polyps Neg Hx     Social History   Socioeconomic History   Marital status: Married    Spouse name: Not on file   Number of children: 1   Years of education: Not on file   Highest education level: Not on file  Occupational History   Occupation: retired form the [employer] 04-2015    Employer: [employer]  Tobacco Use   Smoking status: Never   Smokeless tobacco: Never   Tobacco comments:    never used tobacco  Vaping Use   Vaping Use: Never used  Substance and Sexual Activity   Alcohol use: No    Alcohol/week: 0.0 standard drinks   Drug use: No   Sexual activity: Not Currently    Birth control/protection: None  Other Topics Concern   Not on file  Social History Narrative   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   Physical Exam: Vitals:   03/28/21 1341  BP: 130/78  Pulse: 76   Body mass index is 39.2 kg/m. Gen:      No acute distress HEENT:  sclera anicteric Abd:      soft, non-tender; no palpable masses, no distension Ext:    No edema Neuro: alert and oriented x 3 Psych: normal mood and affect  Data Reviewed:  Reviewed labs, radiology imaging, old records and pertinent past GI work up   Assessment and Plan/Recommendations:  70 year old female with history of chronic GERD, s/p Nissen fundoplication, history of adenomatous colon polyps with complaints of rectal bleeding and change in bowel habits secondary to anal fissure   Continue 0.125% nitroglycerin with RectiCare small pea-sized amount per rectum 3 times daily. Use Benefiber 1 tablespoon twice daily with meals   Use stool softener docusate daily at bedtime to prevent constipation   Dysphagia secondary to EG J outflow obstruction due to fundoplication and esophageal dysmotility Obtain barium esophagram to evaluate for any slipped Nissen and then we will plan for repeat EGD and esophageal dilation if needed Request clearance from cardiology to hold anticoagulation prior to the procedure  The risks and benefits as well as alternatives of endoscopic procedure(s) have been discussed and reviewed. All questions answered. The patient agrees to proceed.    GERD: Continue Aciphex and antireflux measures  Iron deficiency with low saturation, continue oral iron supplements   Return in 2 to 3 months  This visit required 40 minutes of patient care (this includes precharting, chart review, review of results, face-to-face time  used for counseling as well as treatment plan and follow-up. The patient was provided an opportunity to ask questions and all were answered. The patient agreed with the plan and demonstrated an understanding of the instructions.  Damaris Hippo , MD    CC: Colon Branch, MD

## 2021-03-28 NOTE — Telephone Encounter (Signed)
[facility] Pre-operative Risk Assessment     Request for surgical clearance:     Endoscopy Procedure  What type of surgery is being performed?     Endoscopy  When is this surgery scheduled?     05/02/2021  What type of clearance is required ?   Pharmacy  Are there any medications that need to be held prior to surgery and how long? Xarelto  Practice name and name of physician performing surgery?      [facility]  What is your office phone and fax number?      Phone- [phone]  [phone]  Anesthesia type (None, local, MAC, general) ?       MAC

## 2021-03-29 NOTE — Telephone Encounter (Signed)
   Name: Jennifer [name]  DOB: 1950/09/29  MRN: [id number]   Primary Cardiologist: None  Chart reviewed as part of pre-operative protocol coverage.   We do not follow this patient. Appears to be on [facility] for PE/DVT managed by PCP.  I will route this recommendation to the requesting party via Epic fax function and remove from pre-op pool. Please call with questions.  Ledora Bottcher, PA 03/29/2021, 8:46 PM

## 2021-03-30 ENCOUNTER — Telehealth: Payer: Self-pay | Admitting: Gastroenterology

## 2021-03-30 NOTE — Telephone Encounter (Signed)
Inbound call from [employer]. Have questions if Levsin should be ODT or Sublingual Best contact number [phone]

## 2021-03-30 NOTE — Telephone Encounter (Signed)
Dr Larose Kells, Please advise on the patients Xarelto

## 2021-03-30 NOTE — Telephone Encounter (Signed)
[place] back, The script had SL.  Levsin SL

## 2021-03-31 NOTE — Telephone Encounter (Signed)
Indication for Xarelto is recurrent PE. (I reviewed the last hematology note, they were okay stopping anticoagulation) Plan: Okay to hold Xarelto 1 day before and restart 1 day after the procedure.

## 2021-04-04 NOTE — Telephone Encounter (Signed)
Dr Silverio Decamp, Is one day ok to hold or do you need two days  Larose Kells says One day and day after

## 2021-04-04 NOTE — Telephone Encounter (Signed)
Patient aware to hold Xarelto 1 day before

## 2021-04-04 NOTE — Telephone Encounter (Signed)
It is fine to hold Xarelto for 1 day prior to the procedure.  Thank you

## 2021-04-06 ENCOUNTER — Telehealth: Payer: Self-pay | Admitting: Gastroenterology

## 2021-04-06 NOTE — Telephone Encounter (Signed)
Inbound call from Radiology Scheduling stating patient called and has questions in regards to esophageal test.  Patient would like to be called please.

## 2021-04-07 ENCOUNTER — Ambulatory Visit: Payer: Medicare Other | Admitting: Gastroenterology

## 2021-04-07 NOTE — Telephone Encounter (Signed)
Patient has been scheduled for the DG esophagus. Called her home and cell phones. No answer. Left her a message  to call us back or send a patient advise request if she still has any questions.

## 2021-04-10 ENCOUNTER — Encounter: Payer: Self-pay | Admitting: Gastroenterology

## 2021-04-13 ENCOUNTER — Other Ambulatory Visit: Payer: Self-pay | Admitting: Hematology & Oncology

## 2021-04-13 ENCOUNTER — Other Ambulatory Visit: Payer: Self-pay | Admitting: Internal Medicine

## 2021-04-25 ENCOUNTER — Encounter: Payer: Self-pay | Admitting: Family

## 2021-04-26 ENCOUNTER — Ambulatory Visit (INDEPENDENT_AMBULATORY_CARE_PROVIDER_SITE_OTHER): Payer: Medicare Other | Admitting: Internal Medicine

## 2021-04-26 ENCOUNTER — Encounter: Payer: Self-pay | Admitting: Internal Medicine

## 2021-04-26 VITALS — BP 132/72 | HR 73 | Temp 98.1°F | Resp 18 | Ht 69.0 in | Wt 264.4 lb

## 2021-04-26 DIAGNOSIS — F419 Anxiety disorder, unspecified: Secondary | ICD-10-CM

## 2021-04-26 DIAGNOSIS — E119 Type 2 diabetes mellitus without complications: Secondary | ICD-10-CM

## 2021-04-26 DIAGNOSIS — Z79899 Other long term (current) drug therapy: Secondary | ICD-10-CM

## 2021-04-26 DIAGNOSIS — E041 Nontoxic single thyroid nodule: Secondary | ICD-10-CM | POA: Diagnosis not present

## 2021-04-26 DIAGNOSIS — I10 Essential (primary) hypertension: Secondary | ICD-10-CM

## 2021-04-26 NOTE — Patient Instructions (Signed)
Check the  blood pressure regularly.  BP GOAL is between 110/65 and  135/85. If it is consistently higher or lower, let me know    GO TO THE LAB : Get the blood work     GO TO THE FRONT DESK, PLEASE SCHEDULE YOUR APPOINTMENTS Come back for a checkup in 6 months 

## 2021-04-26 NOTE — Progress Notes (Signed)
Subjective:    Patient ID: Jennifer [name], female    DOB: 15-Mar-1951, 71 y.o.   MRN: [id number]  DOS:  04/26/2021 Type of visit - description: Follow-up  Today with talk about her chronic medical problems. She saw pulmonary, GI, hematology, notes reviewed. Recent labs reviewed together with the patient. She is concerned about her weight.   Review of Systems Has no new GI or respiratory symptoms.  Overall she feels well  Past Medical History:  Diagnosis Date   Allergy    Anemia    Arthritis    Cataract    bil cataracts removed   Chronic chest pain    Chronic fatigue 02/03/2017   Clotting disorder [facility])    Pulmonary [place] 2010 & 2015   Colon polyps    inflamed partially ulcerated hyperplastic polyp   Diabetes ([place]) 09/24/2013   no meds   Diverticulosis    Dysphagia esophageal phase - chronic after 3 fundoplications 45/36/4680   Esophageal stenosis    Stricture after fundoplication REDO   Fibromyalgia    GERD (gastroesophageal reflux disease)    Glaucoma suspect    Heart murmur    no problems    Hemorrhoids, internal, with bleeding 06/27/2017   History of hiatal hernia    Hypertension    IBS (irritable bowel syndrome)    Mitral valve prolapse    OSA (obstructive sleep apnea)    on CPAP-Mild - Sleep study 2004   Pulmonary embolus ([place])    2010 and 12-2013   Sleep apnea    wears CPAP   Thyroid nodule      dr. watching    Tinnitus    Subjective   Zoster 2014   R flank    Past Surgical History:  Procedure Laterality Date   ABDOMINAL HYSTERECTOMY     still has 1 ovary    APPENDECTOMY     BRAIN SURGERY  1995   Stem surgery, Arnold Chiari Malformation   CATARACT EXTRACTION Bilateral 2017   cataracts, Dr. Herbert Deaner   CHOLECYSTECTOMY     COLONOSCOPY     ESOPHAGEAL MANOMETRY N/A 08/06/2019   Procedure: ESOPHAGEAL MANOMETRY (EM);  Surgeon: Gatha Mayer, MD;  Location: [facility];  Service: Endoscopy;  Laterality: N/A;   HEMORRHOID BANDING     HERNIA REPAIR      Hital Hernia   KNEE ARTHROSCOPY  05/16/2012   Procedure: ARTHROSCOPY KNEE;  Surgeon: Sharmon Revere, MD;  Location: [place];  Service: Orthopedics;  Laterality: Left;   KNEE ARTHROSCOPY Right 32/1224   NISSEN FUNDOPLICATION     X 3 lab, open x 2 last with mesh   POLYPECTOMY     REDUCTION MAMMAPLASTY Bilateral 2007   TOTAL KNEE ARTHROPLASTY Right 08/25/2019   Procedure: RIGHT TOTAL KNEE ARTHROPLASTY;  Surgeon: Gaynelle Arabian, MD;  Location: [facility];  Service: Orthopedics;  Laterality: Right;  44min   UPPER GASTROINTESTINAL ENDOSCOPY      Allergies as of 04/26/2021       Reactions   Morphine And Related Anaphylaxis, Shortness Of Breath   Sulfonamide Derivatives Hives   Burn from inside out   Promethazine Hcl Other (See Comments)   Hallucinations        Medication List        Accurate as of April 26, 2021 11:59 PM. If you have any questions, ask your nurse or doctor.          STOP taking these medications    benzonatate 100 MG capsule  Commonly known as: TESSALON Stopped by: Kathlene November, MD       TAKE these medications    acetaminophen 500 MG tablet Commonly known as: TYLENOL Take 1 tablet (500 mg total) by mouth every 6 (six) hours as needed.   Aciphex 20 MG tablet Generic drug: RABEprazole TAKE ONE TABLET BY MOUTH TWICE DAILY   AMBULATORY NON FORMULARY MEDICATION Medication Name: Nitroglycerin ointment 0.125% use small pea sized amount per rectum three times a day for 6-8 weeks   dicyclomine 20 MG tablet Commonly known as: BENTYL Take 1 tablet (20 mg total) by mouth every 8 (eight) hours as needed for spasms.   famotidine 20 MG tablet Commonly known as: Pepcid Take 1 tablet (20 mg total) by mouth 2 (two) times daily.   glucose blood test strip Check blood sugar once daily   hydrocortisone valerate cream 0.2 % Commonly known as: WESTCORT Apply 1 application topically daily as needed (eczema). eczema   hyoscyamine 0.125 MG SL tablet Commonly known  as: LEVSIN SL DISSOLVE ONE TABLET IN MOUTH EVERY 4 HOURS AS NEEDED   multivitamin with minerals tablet Take 1 tablet by mouth daily. Rainbow light   ondansetron 8 MG tablet Commonly known as: ZOFRAN Take 1 tablet by mouth as needed.   OneTouch Delica Lancets 26Z Misc Check blood sugar once daily   prednisoLONE acetate 1 % ophthalmic suspension Commonly known as: PRED FORTE Place 1 drop into both eyes as needed.   Prevagen 10 MG Caps Generic drug: Apoaequorin Take 10 mg by mouth daily. prevagen   Restasis 0.05 % ophthalmic emulsion Generic drug: cycloSPORINE Restasis 0.05 % eye drops in a dropperette  INSTILL 1 DROP INTO EACH EYE TWICE DAILY   sucralfate 1 g tablet Commonly known as: Carafate Take 1 gram by mouth three times daily before meals PHARMACY-PLEASE HAVE PATIENT MAKE INTO A SLURRY   temazepam 30 MG capsule Commonly known as: RESTORIL TAKE ONE CAPSULE BY MOUTH AT BEDTIME AS NEEDED FOR SLEEP   triamterene-hydrochlorothiazide 37.5-25 MG tablet Commonly known as: MAXZIDE-25 TAKE HALF TABLET BY MOUTH DAILY   Xarelto 2.5 MG Tabs tablet Generic drug: rivaroxaban TAKE TWO TABLETS BY MOUTH DAILY           Objective:   Physical Exam BP 132/72 (BP Location: Left Arm, Patient Position: Sitting, Cuff Size: Normal)    Pulse 73    Temp 98.1 F (36.7 C) (Oral)    Resp 18    Ht 5\' 9"  (1.753 m)    Wt 264 lb 6 oz (119.9 kg)    SpO2 97%    BMI 39.04 kg/m  General: Well developed, NAD, BMI noted Neck: No  thyromegaly  HEENT:  Normocephalic . Face symmetric, atraumatic Lungs:  CTA B Normal respiratory effort, no intercostal retractions, no accessory muscle use. Heart: RRR,  no murmur.  Abdomen:  Not distended, soft, non-tender. No rebound or rigidity.   Lower extremities: no pretibial edema bilaterally  Skin: Exposed areas without rash. Not pale. Not jaundice Neurologic:  alert & oriented X3.  Speech normal, gait appropriate for age and unassisted Strength  symmetric and appropriate for age.  Psych: Cognition and judgment appear intact.  Cooperative with normal attention span and concentration.  Behavior appropriate. No anxious or depressed appearing.     Assessment      Assessment   DM, + neuropathy  HTN Anxiety, insomnia: On restoril prn  Thyroid nodule: Bx (non neoplastic goiter) 2014,  [place] 05-2016, next 2,3 years Pulmonary: --OSA, sleep  study 2004, on a CPAP --PEs:2010 and 12-2013 - saw hematology 12-2014, rec anticoag x 2 years (until 12-2015), then continue with a low-dose xarelto   life   -- multiple pulmonary nodule, last CT 11-2013: No further imaging suggested --RAD, inhalers rarely Fibromyalgia CV:  MVP,  carotid US 1-39%s GI: --GERD,chronic dysphagia s/p  fundoplication 3, esophageal stenosis- - April 2021: Barium study, esophageal manometry (see report) -- IBS --Hemorrhoids: S/p banding ~/2019 -- chronic right flank,RUQ pain : since GB surgery 2008 Etiology not clear but likely multifactorial --> Adhesions from the surgery, neuropathy (DM), postherpetic neuralgia, scarring from PE, etc.  MRI T spine done 06-2014: DJD  Zoster --  2014 right flank  PLAN DM: Diet controlled, check A1c and micro HTN: BP today is very good, continue Maxide, check a BMP and FLP Anxiety: On Restoril, well controlled.  At some point was on Lexapro as well. Thyroid nodule: Last ultrasound 05-2020, stable, physical exam today essentially negative. OSA, cough Saw pulmonary 03/10/2021 for chronic cough and OSA.  Was Rx Augmentin for cough. Recurrent PE: Saw hematology 12/29/2020, on lifelong anticoagulation with Xarelto 2.5 mg BID Morbid obesity: Encourage a structured diet.  Information about the weight and wellness clinic provided GERD, chronic dysphagia, iron deficiency: Saw GI 03/28/2021, reported rectal bleeding and upper GI symptoms, was Rx topical hemorrhoidal care, also ordered an esophagram, EGD on continue iron supplements Preventive care  reviewed RTC 6 months       This visit occurred during the SARS-CoV-2 public health emergency.  Safety protocols were in place, including screening questions prior to the visit, additional usage of staff PPE, and extensive cleaning of exam room while observing appropriate contact time as indicated for disinfecting solutions.

## 2021-04-27 LAB — BASIC METABOLIC PANEL
BUN: 16 mg/dL (ref 6–23)
CO2: 28 mEq/L (ref 19–32)
Calcium: 9.1 mg/dL (ref 8.4–10.5)
Chloride: 103 mEq/L (ref 96–112)
Creatinine, Ser: 0.92 mg/dL (ref 0.40–1.20)
GFR: 64.05 mL/min (ref >60.00)
Glucose, Bld: 87 mg/dL (ref 70–99)
Potassium: 3.7 mEq/L (ref 3.5–5.1)
Sodium: 141 mEq/L (ref 135–145)

## 2021-04-27 LAB — MICROALBUMIN / CREATININE URINE RATIO
Creatinine,U: 80.2 mg/dL
Microalb Creat Ratio: 0.9 mg/g (ref 0.0–30.0)
Microalb, Ur: <0.7 mg/dL (ref 0.0–1.9)

## 2021-04-27 LAB — LIPID PANEL
Cholesterol: 185 mg/dL (ref 0–200)
HDL: 68.1 mg/dL (ref >39.00)
LDL Cholesterol: 98 mg/dL (ref 0–99)
NonHDL: 116.56
Total CHOL/HDL Ratio: 3
Triglycerides: 91 mg/dL (ref 0.0–149.0)
VLDL: 18.2 mg/dL (ref 0.0–40.0)

## 2021-04-27 LAB — TSH: TSH: 2.85 u[IU]/mL (ref 0.35–5.50)

## 2021-04-27 LAB — HEMOGLOBIN A1C: Hgb A1c MFr Bld: 6.3 % (ref 4.6–6.5)

## 2021-04-27 NOTE — Assessment & Plan Note (Signed)
Preventive care reviewed -Td 2014 -  zostavax 2014 , s/p shingrex x 2 - PNM 23: 2017, 2020 -prevnar 2017 -COVID-vaccine up-to-date - had a flu shot    - Female care: Recommend to see gynecology [facility] 03/04/2021 ([facility]) - CCS: Colonoscopy 2009, cscope 11-2015, + polyp, C-scope 01/17/2021, next per GI -DEXA wnl 04/2017  -ACP package of information provided

## 2021-04-27 NOTE — Assessment & Plan Note (Signed)
DM: Diet controlled, check A1c and micro HTN: BP today is very good, continue Maxide, check a BMP and FLP Anxiety: On Restoril, well controlled.  At some point was on Lexapro as well. Thyroid nodule: Last ultrasound 05-2020, stable, physical exam today essentially negative. OSA, cough Saw pulmonary 03/10/2021 for chronic cough and OSA.  Was Rx Augmentin for cough. Recurrent PE: Saw hematology 12/29/2020, on lifelong anticoagulation with Xarelto 2.5 mg BID Morbid obesity: Encourage a structured diet.  Information about the weight and wellness clinic provided GERD, chronic dysphagia, iron deficiency: Saw GI 03/28/2021, reported rectal bleeding and upper GI symptoms, was Rx topical hemorrhoidal care, also ordered an esophagram, EGD on continue iron supplements Preventive care reviewed RTC 6 months

## 2021-05-02 ENCOUNTER — Encounter: Payer: Self-pay | Admitting: Gastroenterology

## 2021-05-02 ENCOUNTER — Encounter: Payer: Self-pay | Admitting: Family

## 2021-05-02 ENCOUNTER — Ambulatory Visit (AMBULATORY_SURGERY_CENTER): Payer: Medicare Other | Admitting: Gastroenterology

## 2021-05-02 ENCOUNTER — Other Ambulatory Visit: Payer: Self-pay

## 2021-05-02 VITALS — BP 142/85 | HR 79 | Temp 96.0°F | Resp 19 | Ht 68.0 in | Wt 263.0 lb

## 2021-05-02 DIAGNOSIS — K219 Gastro-esophageal reflux disease without esophagitis: Secondary | ICD-10-CM

## 2021-05-02 DIAGNOSIS — K317 Polyp of stomach and duodenum: Secondary | ICD-10-CM

## 2021-05-02 DIAGNOSIS — K222 Esophageal obstruction: Secondary | ICD-10-CM

## 2021-05-02 DIAGNOSIS — K2100 Gastro-esophageal reflux disease with esophagitis, without bleeding: Secondary | ICD-10-CM

## 2021-05-02 DIAGNOSIS — R1310 Dysphagia, unspecified: Secondary | ICD-10-CM

## 2021-05-02 DIAGNOSIS — R1319 Other dysphagia: Secondary | ICD-10-CM

## 2021-05-02 DIAGNOSIS — R110 Nausea: Secondary | ICD-10-CM

## 2021-05-02 MED ORDER — SODIUM CHLORIDE 0.9 % IV SOLN: 500.0000 mL | INTRAVENOUS | Status: DC

## 2021-05-02 MED ORDER — SODIUM CHLORIDE 0.9 % IV SOLN
4.0000 mg | Freq: Once | INTRAVENOUS | Status: AC
  Administered 2021-05-02: 4 mg via INTRAVENOUS

## 2021-05-02 NOTE — Progress Notes (Signed)
[facility] History and Physical   Primary Care Physician:  Colon Branch, MD   Reason for Procedure:  Dysphagia, GERD  Plan:    EGD with possible interventions as needed     HPI: Jennifer [name] is a very pleasant 71 y.o. female here for EGD for evaluation and management of chronic dysphagia..  Please refer to office visit 03/28/21 for additional details  She is on chronic anticoagulation with Xarelto  The risks and benefits as well as alternatives of endoscopic procedure(s) have been discussed and reviewed. All questions answered. The patient agrees to proceed.    Past Medical History:  Diagnosis Date   Allergy    Anemia    Arthritis    Cataract    bil cataracts removed   Chronic chest pain    Chronic fatigue 02/03/2017   Clotting disorder [facility])    Pulmonary [place] 2010 & 2015   Colon polyps    inflamed partially ulcerated hyperplastic polyp   Diabetes ([place]) 09/24/2013   no meds   Diverticulosis    Dysphagia esophageal phase - chronic after 3 fundoplications 97/35/3299   Esophageal stenosis    Stricture after fundoplication REDO   Fibromyalgia    GERD (gastroesophageal reflux disease)    Glaucoma suspect    Heart murmur    no problems    Hemorrhoids, internal, with bleeding 06/27/2017   History of hiatal hernia    Hypertension    IBS (irritable bowel syndrome)    Mitral valve prolapse    OSA (obstructive sleep apnea)    on CPAP-Mild - Sleep study 2004   Pulmonary embolus ([place])    2010 and 12-2013   Sleep apnea    wears CPAP   Thyroid nodule      dr. watching    Tinnitus    Subjective   Zoster 2014   R flank    Past Surgical History:  Procedure Laterality Date   ABDOMINAL HYSTERECTOMY     still has 1 ovary    APPENDECTOMY     BRAIN SURGERY  1995   Stem surgery, Arnold Chiari Malformation   CATARACT EXTRACTION Bilateral 2017   cataracts, Dr. Herbert Deaner   CHOLECYSTECTOMY     COLONOSCOPY     ESOPHAGEAL MANOMETRY N/A 08/06/2019    Procedure: ESOPHAGEAL MANOMETRY (EM);  Surgeon: Gatha Mayer, MD;  [facility];  Service: Endoscopy;  Laterality: N/A;   HEMORRHOID BANDING     HERNIA REPAIR     Hital Hernia   KNEE ARTHROSCOPY  05/16/2012   Procedure: ARTHROSCOPY KNEE;  Surgeon: Sharmon Revere, MD;  Location: [place];  Service: Orthopedics;  Laterality: Left;   KNEE ARTHROSCOPY Right 24/2683   NISSEN FUNDOPLICATION     X 3 lab, open x 2 last with mesh   POLYPECTOMY     REDUCTION MAMMAPLASTY Bilateral 2007   TOTAL KNEE ARTHROPLASTY Right 08/25/2019   Procedure: RIGHT TOTAL KNEE ARTHROPLASTY;  Surgeon: Gaynelle Arabian, MD;  [facility];  Service: Orthopedics;  Laterality: Right;  47min   UPPER GASTROINTESTINAL ENDOSCOPY      Prior to Admission medications   Medication Sig Start Date End Date Taking? Authorizing Provider  acetaminophen (TYLENOL) 500 MG tablet Take 1 tablet (500 mg total) by mouth every 6 (six) hours as needed. 11/02/16  Yes Law, Alexandra M, PA-C  Apoaequorin (PREVAGEN) 10 MG CAPS Take 10 mg by mouth daily. prevagen   Yes [provider]  cycloSPORINE (RESTASIS) 0.05 % ophthalmic emulsion Restasis  0.05 % eye drops in a dropperette  INSTILL 1 DROP INTO EACH EYE TWICE DAILY   Yes [provider]  famotidine (PEPCID) 20 MG tablet Take 1 tablet (20 mg total) by mouth 2 (two) times daily. 03/28/21  Yes Harl Bowie V, MD  glucose blood test strip Check blood sugar once daily 10/18/17  Yes Paz, Alda Berthold, MD  hydrocortisone valerate cream (WESTCORT) 0.2 % Apply 1 application topically daily as needed (eczema). eczema 05/31/18  Yes [provider]  hyoscyamine (LEVSIN SL) 0.125 MG SL tablet DISSOLVE ONE TABLET IN MOUTH EVERY 4 HOURS AS NEEDED 03/28/21  Yes Pranish Akhavan, Venia Minks, MD  Multiple Vitamins-Minerals (MULTIVITAMIN WITH MINERALS) tablet Take 1 tablet by mouth daily. Rainbow light   Yes [provider]  Jonetta Speak LANCETS 97D MISC Check blood sugar once  daily 10/18/17  Yes Paz, Jacqulyn Bath E, MD  sucralfate (CARAFATE) 1 g tablet Take 1 gram by mouth three times daily before meals PHARMACY-PLEASE HAVE PATIENT MAKE INTO A SLURRY 04/02/20  Yes Dakwon Wenberg V, MD  temazepam (RESTORIL) 30 MG capsule TAKE ONE CAPSULE BY MOUTH AT BEDTIME AS NEEDED FOR SLEEP 03/21/21  Yes Colon Branch, MD  triamterene-hydrochlorothiazide [facility]) 37.5-25 MG tablet TAKE HALF TABLET BY MOUTH DAILY 04/13/21  Yes Colon Branch, MD  ACIPHEX 20 MG tablet TAKE ONE TABLET BY MOUTH TWICE DAILY Patient not taking: Reported on 05/02/2021 02/21/21   Gatha Mayer, MD  AMBULATORY NON FORMULARY MEDICATION Medication Name: Nitroglycerin ointment 0.125% use small pea sized amount per rectum three times a day for 6-8 weeks 02/16/21   Mauri Pole, MD  dicyclomine (BENTYL) 20 MG tablet Take 1 tablet (20 mg total) by mouth every 8 (eight) hours as needed for spasms. 04/02/20   Mauri Pole, MD  ondansetron (ZOFRAN) 8 MG tablet Take 1 tablet by mouth as needed. 11/17/19   [provider]  prednisoLONE acetate (PRED FORTE) 1 % ophthalmic suspension Place 1 drop into both eyes as needed. 11/03/19   [provider]  XARELTO 2.5 MG TABS tablet TAKE TWO TABLETS BY MOUTH DAILY 04/13/21   [name], MD    Current Outpatient Medications  Medication Sig Dispense Refill   acetaminophen (TYLENOL) 500 MG tablet Take 1 tablet (500 mg total) by mouth every 6 (six) hours as needed. 30 tablet 0   Apoaequorin (PREVAGEN) 10 MG CAPS Take 10 mg by mouth daily. prevagen     cycloSPORINE (RESTASIS) 0.05 % ophthalmic emulsion Restasis 0.05 % eye drops in a dropperette  INSTILL 1 DROP INTO EACH EYE TWICE DAILY     famotidine (PEPCID) 20 MG tablet Take 1 tablet (20 mg total) by mouth 2 (two) times daily. 60 tablet 11   glucose blood test strip Check blood sugar once daily 100 each 12   hydrocortisone valerate cream (WESTCORT) 0.2 % Apply 1 application topically daily as needed  (eczema). eczema     hyoscyamine (LEVSIN SL) 0.125 MG SL tablet DISSOLVE ONE TABLET IN MOUTH EVERY 4 HOURS AS NEEDED 90 tablet 3   Multiple Vitamins-Minerals (MULTIVITAMIN WITH MINERALS) tablet Take 1 tablet by mouth daily. Rainbow light     ONETOUCH DELICA LANCETS 53G MISC Check blood sugar once daily 100 each 12   sucralfate (CARAFATE) 1 g tablet Take 1 gram by mouth three times daily before meals PHARMACY-PLEASE HAVE PATIENT MAKE INTO A SLURRY 90 tablet 1   temazepam (RESTORIL) 30 MG capsule TAKE ONE CAPSULE BY MOUTH AT BEDTIME  AS NEEDED FOR SLEEP 30 capsule 2   triamterene-hydrochlorothiazide (MAXZIDE-25) 37.5-25 MG tablet TAKE HALF TABLET BY MOUTH DAILY 45 tablet 1   ACIPHEX 20 MG tablet TAKE ONE TABLET BY MOUTH TWICE DAILY (Patient not taking: Reported on 05/02/2021) 60 tablet 11   AMBULATORY NON FORMULARY MEDICATION Medication Name: Nitroglycerin ointment 0.125% use small pea sized amount per rectum three times a day for 6-8 weeks 30 g 1   dicyclomine (BENTYL) 20 MG tablet Take 1 tablet (20 mg total) by mouth every 8 (eight) hours as needed for spasms. 90 tablet 1   ondansetron (ZOFRAN) 8 MG tablet Take 1 tablet by mouth as needed.     prednisoLONE acetate (PRED FORTE) 1 % ophthalmic suspension Place 1 drop into both eyes as needed.     XARELTO 2.5 MG TABS tablet TAKE TWO TABLETS BY MOUTH DAILY 60 tablet 0   Current Facility-Administered Medications  Medication Dose Route Frequency Provider Last Rate Last Admin   0.9 %  sodium chloride infusion  500 mL Intravenous Continuous Tamiah Dysart V, MD        Allergies as of 05/02/2021 - Review Complete 05/02/2021  Allergen Reaction Noted   Morphine and related Anaphylaxis and Shortness Of Breath 12/01/2013   Sulfonamide derivatives Hives 12/02/2018   Promethazine hcl Other (See Comments) 12/02/2018    Family History  Problem Relation Age of Onset   Diabetes Mother    Rheum arthritis Mother    Hypertension Mother    Hypertension  Father    Rheum arthritis Sister    Hypertension Sister    Rheum arthritis Sister    Rheum arthritis Sister    CAD Sister        MI age 100   Fibromyalgia Sister    Colon cancer Maternal Grandmother        dx with colon ca laster in life - lived to be 45   Crohn's disease Maternal Aunt    Stomach cancer Maternal Uncle    Lung cancer Maternal Uncle    Breast cancer Neg Hx    Esophageal cancer Neg Hx    Rectal cancer Neg Hx    Prostate cancer Neg Hx    Pancreatic cancer Neg Hx    Colon polyps Neg Hx     Social History   Socioeconomic History   Marital status: Married    Spouse name: Not on file   Number of children: 1   Years of education: Not on file   Highest education level: Not on file  Occupational History   Occupation: retired form the [employer] 04-2015    Employer: [employer]  Tobacco Use   Smoking status: Never   Smokeless tobacco: Never   Tobacco comments:    never used tobacco  Vaping Use   Vaping Use: Never used  Substance and Sexual Activity   Alcohol use: No    Alcohol/week: 0.0 standard drinks   Drug use: No   Sexual activity: Not Currently    Birth control/protection: None  Other Topics Concern   Not on file  Social History Narrative   [employer] Graduate - [employer] school   Married Jul 15, 2068 for 2 years, divorced; re-married 07/15/84 for 2.5 years, divorced; re-married 1988-07-15 for 1 year, divorced; re-married '00   1 son - Jul 16, 2070   Sister - Died @ 83 [place]       Social Determinants of Radio broadcast assistant Strain: Not on file  Food Insecurity: Not on file  Transportation Needs: Not on file  Physical Activity: Not on file  Stress: Not on file  Social Connections: Not on file  Intimate Partner Violence: Not on file    Review of Systems:  All other review of systems negative except as mentioned in the HPI.  Physical Exam: Vital signs in last 24 hours: BP 134/73    Pulse 82    Temp (!) 96 F (35.6 C) (Temporal)    Ht 5\' 8"  (1.727 m)    Wt 263 lb  (119.3 kg)    SpO2 96%    BMI 39.99 kg/m  General:   Alert, NAD Lungs:  Clear .   Heart:  Regular rate and rhythm Abdomen:  Soft, nontender and nondistended. Neuro/Psych:  Alert and cooperative. Normal mood and affect. A and O x 3  Reviewed labs, radiology imaging, old records and pertinent past GI work up  Patient is appropriate for planned procedure(s) and anesthesia in an ambulatory setting   K. Denzil Magnuson , MD [phone]

## 2021-05-02 NOTE — Progress Notes (Signed)
Called to room to assist during endoscopic procedure.  Patient ID and intended procedure confirmed with present staff. Received instructions for my participation in the procedure from the performing physician.  

## 2021-05-02 NOTE — Patient Instructions (Signed)
Thank you for allowing [place] to care for you today. Await biopsy results, approximately 1-2 weeks.  Will make necessary recommendations at that time Resume previous diet and medications. Resume Xarelto Tuesday 05/03/21 @ prior dose.   Call and make appointment for follow-up with Dr Silverio Decamp in 4-6 weeks. Follow anti-reflux regimen.  Handout provided.      YOU HAD AN ENDOSCOPIC PROCEDURE TODAY AT [facility]:   Refer to the procedure report that was given to you for any specific questions about what was found during the examination.  If the procedure report does not answer your questions, please call your gastroenterologist to clarify.  If you requested that your care partner not be given the details of your procedure findings, then the procedure report has been included in a sealed envelope for you to review at your convenience later.  YOU SHOULD EXPECT: Some feelings of bloating in the abdomen. Passage of more gas than usual.  Walking can help get rid of the air that was put into your GI tract during the procedure and reduce the bloating. If you had a lower endoscopy (such as a colonoscopy or flexible sigmoidoscopy) you may notice spotting of blood in your stool or on the toilet paper. If you underwent a bowel prep for your procedure, you may not have a normal bowel movement for a few days.  Please Note:  You might notice some irritation and congestion in your nose or some drainage.  This is from the oxygen used during your procedure.  There is no need for concern and it should clear up in a day or so.  SYMPTOMS TO REPORT IMMEDIATELY:   Following upper endoscopy (EGD)  Vomiting of blood or coffee ground material  New chest pain or pain under the shoulder blades  Painful or persistently difficult swallowing  New shortness of breath  Fever of 100F or higher  Black, tarry-looking stools  For urgent or emergent issues, a gastroenterologist can be reached at any hour by calling  [phone]. Do not use MyChart messaging for urgent concerns.    DIET:  We do recommend a small meal at first, but then you may proceed to your regular diet.  Drink plenty of fluids but you should avoid alcoholic beverages for 24 hours.  ACTIVITY:  You should plan to take it easy for the rest of today and you should NOT DRIVE or use heavy machinery until tomorrow (because of the sedation medicines used during the test).    FOLLOW UP: Our staff will call the number listed on your records 48-72 hours following your procedure to check on you and address any questions or concerns that you may have regarding the information given to you following your procedure. If we do not reach you, we will leave a message.  We will attempt to reach you two times.  During this call, we will ask if you have developed any symptoms of COVID 19. If you develop any symptoms (ie: fever, flu-like symptoms, shortness of breath, cough etc.) before then, please call [phone].  If you test positive for Covid 19 in the 2 weeks post procedure, please call and report this information to [place].    If any biopsies were taken you will be contacted by phone or by letter within the next 1-3 weeks.  Please call us at [phone] if you have not heard about the biopsies in 3 weeks.    SIGNATURES/CONFIDENTIALITY: You and/or your care partner have signed paperwork which  will be entered into your electronic medical record.  These signatures attest to the fact that that the information above on your After Visit Summary has been reviewed and is understood.  Full responsibility of the confidentiality of this discharge information lies with you and/or your care-partner.

## 2021-05-02 NOTE — Op Note (Signed)
[place] Patient Name: Jennifer [name] Procedure Date: 05/02/2021 10:19 AM MRN: [id number] Endoscopist: Mauri Pole , MD Age: 71 Referring MD:  Date of Birth: 06/23/1950 Gender: Female Account #: [id number] Procedure:                Upper GI endoscopy Indications:              Dysphagia, Esophageal reflux symptoms that persist                            despite appropriate therapy, Esophageal reflux                            symptoms that recur despite appropriate therapy Medicines:                Monitored Anesthesia Care Procedure:                Pre-Anesthesia Assessment:                           - Prior to the procedure, a History and Physical                            was performed, and patient medications and                            allergies were reviewed. The patient's tolerance of                            previous anesthesia was also reviewed. The risks                            and benefits of the procedure and the sedation                            options and risks were discussed with the patient.                            All questions were answered, and informed consent                            was obtained. Prior Anticoagulants: The patient                            last took Xarelto (rivaroxaban) 2 days prior to the                            procedure. ASA Grade Assessment: III - A patient                            with severe systemic disease. After reviewing the                            risks and benefits, the patient was deemed in  satisfactory condition to undergo the procedure.                           After obtaining informed consent, the endoscope was                            passed under direct vision. Throughout the                            procedure, the patient's blood pressure, pulse, and                            oxygen saturations were monitored continuously. The                             Endoscope was introduced through the mouth, and                            advanced to the second part of duodenum. The upper                            GI endoscopy was accomplished without difficulty.                            The patient tolerated the procedure well. Scope In: Scope Out: Findings:                 LA Grade C (one or more mucosal breaks continuous                            between tops of 2 or more mucosal folds, less than                            75% circumference) esophagitis with no bleeding was                            found 34 to 36 cm from the incisors. Biopsies were                            taken with a cold forceps for histology.                           There were esophageal mucosal changes suspicious                            for short-segment Barrett's esophagus present in                            the lower third of the esophagus with focal                            nodularity at 35cm. The maximum longitudinal extent  of these mucosal changes was 2 cm in length. Mucosa                            was biopsied with a cold forceps for histology in a                            targeted manner at intervals of 1 cm. One specimen                            bottle was sent to pathology.                           Evidence of a Nissen fundoplication was found at                            the gastroesophageal junction. The wrap appeared                            intact. This was traversed.                           Patchy mild inflammation characterized by                            congestion (edema) and erythema was found in the                            cardia and in the gastric body. Biopsies were taken                            with a cold forceps for Helicobacter pylori testing.                           The examined duodenum was normal. Complications:            No immediate complications. Estimated Blood Loss:      Estimated blood loss was minimal. Impression:               - LA Grade C esophagitis with no bleeding. Biopsied.                           - Esophageal mucosal changes suspicious for                            short-segment Barrett's esophagus. Biopsied.                           - A Nissen fundoplication was found. The wrap                            appears intact.                           - Gastritis. Biopsied.                           -  Normal examined duodenum. Recommendation:           - Patient has a contact number available for                            emergencies. The signs and symptoms of potential                            delayed complications were discussed with the                            patient. Return to normal activities tomorrow.                            Written discharge instructions were provided to the                            patient.                           - Resume previous diet.                           - Continue present medications.                           - Await pathology results.                           - Resume Xarelto (rivaroxaban) at prior dose                            tomorrow. Refer to managing physician for further                            adjustment of therapy.                           - Follow an antireflux regimen.                           - Return to GI office at the next available                            appointment in 4-6 weeks. Mauri Pole, MD 05/02/2021 10:44:50 AM This report has been signed electronically.

## 2021-05-02 NOTE — Progress Notes (Signed)
Pt's states no medical or surgical changes since previsit or office visit. 

## 2021-05-02 NOTE — Progress Notes (Signed)
franPatient having mid gastric pain. Received Lyvsin .125 mg and diluted simethicone drops 0.3 ml.  Repeated simethicone .06 ml @ 1115 1125 Zofran 4mg  IV given 1135 IB-Guard 2 capsules given 1145 Patient reports significant relief, discharged home with husband.

## 2021-05-04 ENCOUNTER — Telehealth: Payer: Self-pay

## 2021-05-04 ENCOUNTER — Ambulatory Visit (HOSPITAL_COMMUNITY)
Admission: RE | Admit: 2021-05-04 | Discharge: 2021-05-04 | Disposition: A | Discharge status: Home / Self Care | Payer: Medicare Other | Source: Ambulatory Visit | Attending: Gastroenterology | Admitting: Gastroenterology

## 2021-05-04 ENCOUNTER — Telehealth: Payer: Self-pay | Admitting: *Deleted

## 2021-05-04 ENCOUNTER — Other Ambulatory Visit: Payer: Self-pay

## 2021-05-04 DIAGNOSIS — K222 Esophageal obstruction: Secondary | ICD-10-CM | POA: Diagnosis not present

## 2021-05-04 DIAGNOSIS — K2289 Other specified disease of esophagus: Secondary | ICD-10-CM | POA: Diagnosis not present

## 2021-05-04 DIAGNOSIS — R1310 Dysphagia, unspecified: Secondary | ICD-10-CM | POA: Insufficient documentation

## 2021-05-04 NOTE — Telephone Encounter (Signed)
No answer for post procedure call back. Left VM. 

## 2021-05-04 NOTE — Telephone Encounter (Signed)
Left message on follow up call. 

## 2021-05-05 ENCOUNTER — Encounter: Payer: Self-pay | Admitting: Gastroenterology

## 2021-05-09 DIAGNOSIS — Z0289 Encounter for other administrative examinations: Secondary | ICD-10-CM

## 2021-05-11 ENCOUNTER — Encounter: Payer: Self-pay | Admitting: Family

## 2021-05-18 ENCOUNTER — Inpatient Hospital Stay: Payer: Medicare Other | Admitting: Hematology & Oncology

## 2021-05-18 ENCOUNTER — Inpatient Hospital Stay: Payer: Medicare Other

## 2021-05-19 ENCOUNTER — Encounter (INDEPENDENT_AMBULATORY_CARE_PROVIDER_SITE_OTHER): Payer: Self-pay | Admitting: Bariatrics

## 2021-05-19 ENCOUNTER — Ambulatory Visit (INDEPENDENT_AMBULATORY_CARE_PROVIDER_SITE_OTHER): Payer: Medicare Other | Admitting: Bariatrics

## 2021-05-19 ENCOUNTER — Other Ambulatory Visit: Payer: Self-pay

## 2021-05-19 VITALS — BP 137/74 | HR 77 | Temp 98.1°F | Ht 68.0 in | Wt 255.0 lb

## 2021-05-19 DIAGNOSIS — Z6838 Body mass index (BMI) 38.0-38.9, adult: Secondary | ICD-10-CM

## 2021-05-19 DIAGNOSIS — D500 Iron deficiency anemia secondary to blood loss (chronic): Secondary | ICD-10-CM | POA: Diagnosis not present

## 2021-05-19 DIAGNOSIS — R0602 Shortness of breath: Secondary | ICD-10-CM | POA: Diagnosis not present

## 2021-05-19 DIAGNOSIS — E1169 Type 2 diabetes mellitus with other specified complication: Secondary | ICD-10-CM | POA: Diagnosis not present

## 2021-05-19 DIAGNOSIS — M797 Fibromyalgia: Secondary | ICD-10-CM | POA: Diagnosis not present

## 2021-05-19 DIAGNOSIS — Z1331 Encounter for screening for depression: Secondary | ICD-10-CM | POA: Diagnosis not present

## 2021-05-19 DIAGNOSIS — R5383 Other fatigue: Secondary | ICD-10-CM | POA: Diagnosis not present

## 2021-05-19 DIAGNOSIS — M1710 Unilateral primary osteoarthritis, unspecified knee: Secondary | ICD-10-CM

## 2021-05-19 DIAGNOSIS — E559 Vitamin D deficiency, unspecified: Secondary | ICD-10-CM | POA: Diagnosis not present

## 2021-05-19 DIAGNOSIS — E669 Obesity, unspecified: Secondary | ICD-10-CM | POA: Diagnosis not present

## 2021-05-19 DIAGNOSIS — I10 Essential (primary) hypertension: Secondary | ICD-10-CM

## 2021-05-19 DIAGNOSIS — K219 Gastro-esophageal reflux disease without esophagitis: Secondary | ICD-10-CM

## 2021-05-19 DIAGNOSIS — K588 Other irritable bowel syndrome: Secondary | ICD-10-CM

## 2021-05-19 NOTE — Progress Notes (Signed)
Chief Complaint:   Jennifer [name] (MR# [id number]) is a 71 y.o. female who presents for evaluation and treatment of Jennifer and related comorbidities. Current BMI is Body mass index is 38.77 kg/m. Jennifer [name] has been struggling with her weight for many years and has been unsuccessful in either losing weight, maintaining weight loss, or reaching her healthy weight goal.  Jennifer [name] does like to cook. She craves sweets.  Jennifer [name] is currently in the action stage of change and ready to dedicate time achieving and maintaining a healthier weight. Jennifer [name] is interested in becoming our patient and working on intensive lifestyle modifications including (but not limited to) diet and exercise for weight loss.  Jennifer [name]'s habits were reviewed today and are as follows: Her family eats meals together, she thinks her family will eat healthier with her, her desired weight loss is 65  pounds, she has been heavy most of her life, she started gaining weight after brain surgery, her heaviest weight ever was 278 pounds, she has significant food cravings issues, she snacks frequently in the evenings, she skips meals frequently, and she frequently makes poor food choices.  Depression Screen Jennifer [name]'s Food and Mood (modified PHQ-9) score was 1.  Depression screen Jennifer [name] 2/9 05/19/2021  Decreased Interest 1  Down, Depressed, Hopeless 0  PHQ - 2 Score 1  Altered sleeping 0  Tired, decreased energy 0  Change in appetite 0  Feeling bad or failure about yourself  0  Trouble concentrating 0  Moving slowly or fidgety/restless 0  Suicidal thoughts 0  PHQ-9 Score 1  Difficult doing work/chores Not difficult at all  Some recent data might be hidden   Subjective:   1. Other fatigue Jennifer [name] admits to daytime somnolence and admits to waking up still tired. Patent has a history of symptoms of daytime fatigue. Jennifer [name] generally gets 7 hours of sleep per night, and states that she has generally restful sleep. Snoring is present. Apneic episodes  are not present. Epworth Sleepiness Score is 0.   2. SOB (shortness of breath) on exertion Jennifer [name] notes increasing shortness of breath with exercising and seems to be worsening over time with weight gain. She notes getting out of breath sooner with activity than she used to. This has not gotten worse recently. Jennifer [name] denies shortness of breath at rest or orthopnea.   3. Iron deficiency anemia due to chronic blood loss Jennifer [name]'s iron is stable.  4. Primary osteoarthritis of knee, unspecified laterality Jennifer [name] is at higher risk of osteopenia and osteoporosis due to Vitamin D deficiency.    5. Fibromyalgia syndrome Jennifer [name] notes shoulder pain.  6. Diabetes mellitus type 2 in obese (Jennifer [name]) Jennifer [name]'s last A1C was 6.3.  7. Vitamin D deficiency Jennifer [name] is not on Vitamin D currently.  8. Essential hypertension Jennifer [name]'s blood pressure is controlled.  9. Gastroesophageal reflux disease, unspecified whether esophagitis present Jennifer [name] is taking Aciphex and Levsin currently. She struggles with this [employer] 3 times.  10. Other irritable bowel syndrome Jennifer [name] notes irritable bowel syndrome. She is currently taking Levsin.  Assessment/Plan:   1. Other fatigue Jennifer [name] does feel that her weight is causing her energy to be lower than it should be. Fatigue may be related to Jennifer, depression or many other causes. Labs will be ordered, and in the meanwhile, Jennifer [name] will focus on self care including making healthy food choices, increasing physical activity and focusing on stress reduction.  - EKG 12-Lead  2. SOB (shortness of breath) on exertion Jennifer [name] does feel that she  gets out of breath more easily that she used to when she exercises. Jennifer [name]'s shortness of breath appears to be Jennifer related and exercise induced. She has agreed to work on weight loss and gradually increase exercise to treat her exercise induced shortness of breath. Will continue to monitor closely.   3. Iron deficiency anemia due to chronic blood loss Orders and  follow up as documented in patient record. Jennifer [name] plan to follow up with primary care physician.  Counseling Iron is essential for our bodies to make red blood cells.  Reasons that someone may be deficient include: an iron-deficient diet (more likely in those following vegan or vegetarian diets), women with heavy menses, patients with GI disorders or poor absorption, patients that have had bariatric surgery, frequent blood donors, patients with cancer, and patients with heart disease.   An iron supplement has been recommended. This is found over-the-counter.  Iron-rich foods include dark leafy greens, red and white meats, eggs, seafood, and beans.   Certain foods and drinks prevent your body from absorbing iron properly. Avoid eating these foods in the same meal as iron-rich foods or with iron supplements. These foods include: coffee, black tea, and red wine; milk, dairy products, and foods that are high in calcium; beans and soybeans; whole grains.  Constipation can be a side effect of iron supplementation. Increased water and fiber intake are helpful. Water goal: > 2 liters/day. Fiber goal: > 25 grams/day.   4. Primary osteoarthritis of knee, unspecified laterality We will continue to monitor. Orders and follow up as documented in patient record.  Counseling Osteoporosis happens when your bones get thin and weak. This can cause your bones to break (fracture) more easily.  Exercise is very important to keep bones strong. Focus on strength training (lifting weights) and exercises that make your muscles work to hold your body weight up (weight-bearing exercises). These include tai chi, yoga, and walking.  Limit alcohol intake to no more than 1 drink a day for nonpregnant women and 2 drinks a day for men. One drink equals 12 oz of beer, 5 oz of wine, or 1 oz of hard liquor. Do not use any products that have nicotine or tobacco in them.  Preventing falls Use tools to help you move around (mobility  aids) as needed. These include canes, walkers, scooters, and crutches. Keep rooms well-lit and free of clutter. Wear shoes that fit you well and support your feet. Eat plenty of calcium and Vitamin D as these nutrients are good for your bones.     5. Fibromyalgia syndrome Jennifer [name] will gradually increase exercise.  6. Diabetes mellitus type 2 in obese ([place]) Jennifer [name]'s diabetes mellitus type 2 is controlled in diet. We will check periodically with changes in diet. We will check insulin and C-peptide today.Good blood sugar control is important to decrease the likelihood of diabetic complications such as nephropathy, neuropathy, limb loss, blindness, coronary artery disease, and death. Intensive lifestyle modification including diet, exercise and weight loss are the first line of treatment for diabetes.   - Insulin, random - C-peptide  7. Vitamin D deficiency Low Vitamin D level contributes to fatigue and are associated with Jennifer, breast, and colon cancer. We will check Vitamin D today and Angalina will follow-up for routine testing of Vitamin D, at least 2-3 times per year to avoid over-replacement.  - VITAMIN D 25 Hydroxy (Vit-D Deficiency, Fractures)  8. Essential hypertension Jennifer [name] will continue taking her medications. She is working on healthy weight loss and exercise  to improve blood pressure control. We will watch for signs of hypotension as she continues her lifestyle modifications.  9. Gastroesophageal reflux disease, unspecified whether esophagitis present Jennifer [name] will continue taking her medications. Intensive lifestyle modifications are the first line treatment for this issue. We discussed several lifestyle modifications today and she will continue to work on diet, exercise and weight loss efforts. Orders and follow up as documented in patient record.   Counseling If a person has gastroesophageal reflux disease (GERD), food and stomach acid move back up into the esophagus and cause symptoms or  problems such as damage to the esophagus. Anti-reflux measures include: raising the head of the bed, avoiding tight clothing or belts, avoiding eating late at night, not lying down shortly after mealtime, and achieving weight loss. Avoid ASA, NSAID's, caffeine, alcohol, and tobacco.  OTC Pepcid and/or Tums are often very helpful for as needed use.  However, for persisting chronic or daily symptoms, stronger medications like Omeprazole may be needed. You may need to avoid foods and drinks such as: Coffee and tea (with or without caffeine). Drinks that contain alcohol. Energy drinks and sports drinks. Bubbly (carbonated) drinks or sodas. Chocolate and cocoa. Peppermint and mint flavorings. Garlic and onions. Horseradish. Spicy and acidic foods. These include peppers, chili powder, curry powder, vinegar, hot sauces, and BBQ sauce. Citrus fruit juices and citrus fruits, such as oranges, lemons, and limes. Tomato-based foods. These include red sauce, chili, salsa, and pizza with red sauce. Fried and fatty foods. These include donuts, french fries, potato chips, and high-fat dressings. High-fat meats. These include hot dogs, rib eye steak, sausage, ham, and bacon.   10. Other irritable bowel syndrome Jennifer [name] will follow up with gastroenterology.  11. Depression screen Jennifer [name] had a negative depression screening. Depression is commonly associated with Jennifer and often results in emotional eating behaviors. We will monitor this closely and work on CBT to help improve the non-hunger eating patterns. Referral to Psychology may be required if no improvement is seen as she continues in our clinic.   12. Class 2 severe Jennifer with serious comorbidity and body mass index (BMI) of 38.0 to 38.9 in adult, unspecified Jennifer type Jennifer [name]) Jennifer [name] is currently in the action stage of change and her goal is to continue with weight loss efforts. I recommend Jennifer [name] begin the structured treatment plan as follows:  She has  agreed to the Category 2 Plan.  Tamaria will continue meal planning. We reviewed labs from 04/26/2021 CMP, Lipids, A1C, glucose and micro-albumin (urine ).  She will not skip meals.  Exercise goals: No exercise has been prescribed at this time.   Behavioral modification strategies: increasing lean protein intake, decreasing simple carbohydrates, increasing vegetables, increasing water intake, decreasing eating out, no skipping meals, meal planning and cooking strategies, keeping healthy foods in the home, and planning for success.  She was informed of the importance of frequent follow-up visits to maximize her success with intensive lifestyle modifications for her multiple health conditions. She was informed we would discuss her lab results at her next visit unless there is a critical issue that needs to be addressed sooner. Adda agreed to keep her next visit at the agreed upon time to discuss these results.  Objective:   Blood pressure 137/74, pulse 77, temperature 98.1 F (36.7 C), height '5\' 8"'  (1.727 m), weight 255 lb (115.7 kg), SpO2 98 %. Body mass index is 38.77 kg/m.  EKG: Normal sinus rhythm, rate 76 bpm.  Indirect Calorimeter completed today shows a  VO2 of 258 and a REE of 1786.  Her calculated basal metabolic rate is 3568 thus her basal metabolic rate is worse than expected.  General: Cooperative, alert, well developed, in no acute distress. HEENT: Conjunctivae and lids unremarkable. Cardiovascular: Regular rhythm.  Lungs: Normal work of breathing. Neurologic: No focal deficits.   Lab Results  Component Value Date   CREATININE 0.92 04/26/2021   BUN 16 04/26/2021   NA 141 04/26/2021   K 3.7 04/26/2021   CL 103 04/26/2021   CO2 28 04/26/2021   Lab Results  Component Value Date   ALT 23 12/29/2020   AST 18 12/29/2020   ALKPHOS 55 12/29/2020   BILITOT 0.3 12/29/2020   Lab Results  Component Value Date   HGBA1C 6.3 04/26/2021   HGBA1C 6.0 (H) 12/29/2020   HGBA1C 5.9 (H)  08/27/2020   HGBA1C 5.6 04/29/2020   HGBA1C 6.0 11/19/2019   No results found for: INSULIN Lab Results  Component Value Date   TSH 2.85 04/26/2021   Lab Results  Component Value Date   CHOL 185 04/26/2021   HDL 68.10 04/26/2021   LDLCALC 98 04/26/2021   TRIG 91.0 04/26/2021   CHOLHDL 3 04/26/2021   Lab Results  Component Value Date   WBC 8.2 12/29/2020   HGB 12.0 02/16/2021   HCT 36.5 02/16/2021   MCV 87.4 12/29/2020   PLT 301 12/29/2020   Lab Results  Component Value Date   IRON 59 02/16/2021   TIBC 312.2 02/16/2021   FERRITIN 73.1 02/16/2021   Attestation Statements:   Reviewed by clinician on day of visit: allergies, medications, problem list, medical history, surgical history, family history, social history, and previous encounter notes.  I, Lizbeth Bark, RMA, am acting as Location manager for [employer], DO.  I have reviewed the above documentation for accuracy and completeness, and I agree with the above. Jearld Lesch, DO

## 2021-05-20 LAB — C-PEPTIDE: C-Peptide: 2.4 ng/mL (ref 1.1–4.4)

## 2021-05-20 LAB — VITAMIN D 25 HYDROXY (VIT D DEFICIENCY, FRACTURES): Vit D, 25-Hydroxy: 42.2 ng/mL (ref 30.0–100.0)

## 2021-05-20 LAB — INSULIN, RANDOM: INSULIN: 12.6 u[IU]/mL (ref 2.6–24.9)

## 2021-05-23 ENCOUNTER — Other Ambulatory Visit: Payer: Self-pay | Admitting: Hematology & Oncology

## 2021-05-23 ENCOUNTER — Encounter (INDEPENDENT_AMBULATORY_CARE_PROVIDER_SITE_OTHER): Payer: Self-pay | Admitting: Bariatrics

## 2021-06-01 ENCOUNTER — Ambulatory Visit: Payer: Medicare Other | Admitting: Hematology & Oncology

## 2021-06-01 ENCOUNTER — Inpatient Hospital Stay: Payer: Medicare Other

## 2021-06-02 ENCOUNTER — Other Ambulatory Visit: Payer: Self-pay

## 2021-06-02 ENCOUNTER — Ambulatory Visit (INDEPENDENT_AMBULATORY_CARE_PROVIDER_SITE_OTHER): Payer: Medicare Other | Admitting: Bariatrics

## 2021-06-02 ENCOUNTER — Encounter (INDEPENDENT_AMBULATORY_CARE_PROVIDER_SITE_OTHER): Payer: Self-pay | Admitting: Bariatrics

## 2021-06-02 VITALS — BP 135/82 | HR 88 | Temp 98.2°F | Ht 68.0 in | Wt 253.0 lb

## 2021-06-02 DIAGNOSIS — Z6838 Body mass index (BMI) 38.0-38.9, adult: Secondary | ICD-10-CM | POA: Diagnosis not present

## 2021-06-02 DIAGNOSIS — E65 Localized adiposity: Secondary | ICD-10-CM | POA: Diagnosis not present

## 2021-06-02 DIAGNOSIS — E559 Vitamin D deficiency, unspecified: Secondary | ICD-10-CM

## 2021-06-02 DIAGNOSIS — E669 Obesity, unspecified: Secondary | ICD-10-CM | POA: Diagnosis not present

## 2021-06-02 DIAGNOSIS — E1169 Type 2 diabetes mellitus with other specified complication: Secondary | ICD-10-CM | POA: Diagnosis not present

## 2021-06-06 ENCOUNTER — Other Ambulatory Visit: Payer: Self-pay

## 2021-06-06 ENCOUNTER — Inpatient Hospital Stay: Payer: Medicare Other | Attending: Hematology & Oncology

## 2021-06-06 ENCOUNTER — Encounter: Payer: Self-pay | Admitting: Internal Medicine

## 2021-06-06 ENCOUNTER — Inpatient Hospital Stay (HOSPITAL_BASED_OUTPATIENT_CLINIC_OR_DEPARTMENT_OTHER): Payer: Medicare Other | Admitting: Hematology & Oncology

## 2021-06-06 ENCOUNTER — Encounter: Payer: Self-pay | Admitting: Hematology & Oncology

## 2021-06-06 VITALS — BP 130/64 | HR 73 | Temp 98.5°F | Resp 18 | Wt 255.0 lb

## 2021-06-06 DIAGNOSIS — I2699 Other pulmonary embolism without acute cor pulmonale: Secondary | ICD-10-CM | POA: Diagnosis not present

## 2021-06-06 DIAGNOSIS — D500 Iron deficiency anemia secondary to blood loss (chronic): Secondary | ICD-10-CM | POA: Diagnosis not present

## 2021-06-06 DIAGNOSIS — E785 Hyperlipidemia, unspecified: Secondary | ICD-10-CM

## 2021-06-06 DIAGNOSIS — Z7901 Long term (current) use of anticoagulants: Secondary | ICD-10-CM | POA: Diagnosis not present

## 2021-06-06 DIAGNOSIS — I2601 Septic pulmonary embolism with acute cor pulmonale: Secondary | ICD-10-CM

## 2021-06-06 LAB — CMP (CANCER CENTER ONLY)
ALT: 17 U/L (ref 0–44)
AST: 27 U/L (ref 15–41)
Albumin: 3.8 g/dL (ref 3.5–5.0)
Alkaline Phosphatase: 52 U/L (ref 38–126)
Anion gap: 8 (ref 5–15)
BUN: 25 mg/dL — ABNORMAL HIGH (ref 8–23)
CO2: 30 mmol/L (ref 22–32)
Calcium: 8.6 mg/dL — ABNORMAL LOW (ref 8.9–10.3)
Chloride: 107 mmol/L (ref 98–111)
Creatinine: 0.94 mg/dL (ref 0.44–1.00)
GFR, Estimated: >60 mL/min (ref >60)
Glucose, Bld: 90 mg/dL (ref 70–99)
Potassium: 3.5 mmol/L (ref 3.5–5.1)
Sodium: 145 mmol/L (ref 135–145)
Total Bilirubin: 0.4 mg/dL (ref 0.3–1.2)
Total Protein: 7.1 g/dL (ref 6.5–8.1)

## 2021-06-06 LAB — CBC WITH DIFFERENTIAL (CANCER CENTER ONLY)
Abs Immature Granulocytes: 0.01 10*3/uL (ref 0.00–0.07)
Basophils Absolute: 0 10*3/uL (ref 0.0–0.1)
Basophils Relative: 1 %
Eosinophils Absolute: 0.2 10*3/uL (ref 0.0–0.5)
Eosinophils Relative: 3 %
HCT: 38.3 % (ref 36.0–46.0)
Hemoglobin: 12.5 g/dL (ref 12.0–15.0)
Immature Granulocytes: 0 %
Lymphocytes Relative: 40 %
Lymphs Abs: 2.5 10*3/uL (ref 0.7–4.0)
MCH: 28.7 pg (ref 26.0–34.0)
MCHC: 32.6 g/dL (ref 30.0–36.0)
MCV: 88 fL (ref 80.0–100.0)
Monocytes Absolute: 0.9 10*3/uL (ref 0.1–1.0)
Monocytes Relative: 14 %
Neutro Abs: 2.7 10*3/uL (ref 1.7–7.7)
Neutrophils Relative %: 42 %
Platelet Count: 245 10*3/uL (ref 150–400)
RBC: 4.35 MIL/uL (ref 3.87–5.11)
RDW: 15 % (ref 11.5–15.5)
WBC Count: 6.3 10*3/uL (ref 4.0–10.5)
nRBC: 0 % (ref 0.0–0.2)

## 2021-06-06 LAB — LACTATE DEHYDROGENASE: LDH: 193 U/L — ABNORMAL HIGH (ref 98–192)

## 2021-06-06 NOTE — Progress Notes (Signed)
Hematology and Oncology Follow Up Visit  Jennifer [name] [id number] May 16, 1950 71 y.o. 06/06/2021   Principle Diagnosis:  Recurrent pulmonary embolism   Current Therapy:        Xarelto 5 mg po q day - lifelong   Interim History:  Jennifer [name] is here today for follow-up.  She is under a little bit of stress right now.  Her older sister is in the hospital with heart failure.  Sound like she may be there for a couple weeks.  She is also worried about her diabetes and her cholesterol.  Apparently, her family doctor wants to put her on some statin drug.  I think that it would not be a bad idea if she did go on a statin drug.  She has had no problems with respect to her colonoscopy.  She has back in September.  A polyp was found in the transverse colon.  She also had an upper endoscopy on 05/02/2021.  This all look good.  She did not require any type of dilation.  Biopsies were taken which not show any obvious Barrett's esophagus.  She is on the Xarelto.  She is doing well on low-dose Xarelto.  She has had no leg pain.  Is been no leg swelling.  She has had no rashes.  She has had no fever.  Overall, has been no acute chest wall pain.  She has had no shortness of breath.  There is been no cough.  Her performance status is ECOG 0.     Medications:  Allergies as of 06/06/2021       Reactions   Morphine And Related Anaphylaxis, Shortness Of Breath   Sulfonamide Derivatives Hives   Burn from inside out   Promethazine Hcl Other (See Comments)   Hallucinations        Medication List        Accurate as of June 06, 2021  4:04 PM. If you have any questions, ask your nurse or doctor.          acetaminophen 500 MG tablet Commonly known as: TYLENOL Take 1 tablet (500 mg total) by mouth every 6 (six) hours as needed.   Aciphex 20 MG tablet Generic drug: RABEprazole TAKE ONE TABLET BY MOUTH TWICE DAILY   hyoscyamine 0.125 MG SL tablet Commonly known as: LEVSIN SL DISSOLVE ONE  TABLET IN MOUTH EVERY 4 HOURS AS NEEDED   multivitamin with minerals tablet Take 1 tablet by mouth daily. Rainbow light   Prevagen 10 MG Caps Generic drug: Apoaequorin Take 10 mg by mouth daily. prevagen   Restasis 0.05 % ophthalmic emulsion Generic drug: cycloSPORINE Restasis 0.05 % eye drops in a dropperette  INSTILL 1 DROP INTO EACH EYE TWICE DAILY   temazepam 30 MG capsule Commonly known as: RESTORIL TAKE ONE CAPSULE BY MOUTH AT BEDTIME AS NEEDED FOR SLEEP   triamterene-hydrochlorothiazide 37.5-25 MG tablet Commonly known as: MAXZIDE-25 TAKE HALF TABLET BY MOUTH DAILY   Xarelto 2.5 MG Tabs tablet Generic drug: rivaroxaban TAKE TWO TABLETS BY MOUTH DAILY        Allergies:  Allergies  Allergen Reactions   Morphine And Related Anaphylaxis and Shortness Of Breath   Sulfonamide Derivatives Hives    Burn from inside out   Promethazine Hcl Other (See Comments)    Hallucinations    Past Medical History, Surgical history, Social history, and Family History were reviewed and updated.  Review of Systems: Review of Systems  Constitutional: Negative.   HENT: Negative.  Eyes: Negative.   Respiratory: Negative.    Cardiovascular: Negative.   Gastrointestinal: Negative.   Genitourinary: Negative.   Musculoskeletal:  Positive for joint pain.  Skin: Negative.   Neurological: Negative.   Endo/Heme/Allergies: Negative.   Psychiatric/Behavioral: Negative.      Physical Exam:  weight is 255 lb (115.7 kg). Her oral temperature is 98.5 F (36.9 C). Her blood pressure is 130/64 and her pulse is 73. Her respiration is 18 and oxygen saturation is 92%.   Wt Readings from Last 3 Encounters:  06/06/21 255 lb (115.7 kg)  06/02/21 253 lb (114.8 kg)  05/19/21 255 lb (115.7 kg)    Physical Exam Vitals reviewed.  HENT:     Head: Normocephalic and atraumatic.  Eyes:     Pupils: Pupils are equal, round, and reactive to light.  Cardiovascular:     Rate and Rhythm: Normal  rate and regular rhythm.     Heart sounds: Normal heart sounds.  Pulmonary:     Effort: Pulmonary effort is normal.     Breath sounds: Normal breath sounds.  Abdominal:     General: Bowel sounds are normal.     Palpations: Abdomen is soft.  Musculoskeletal:        General: No tenderness or deformity. Normal range of motion.     Cervical back: Normal range of motion.  Lymphadenopathy:     Cervical: No cervical adenopathy.  Skin:    General: Skin is warm and dry.     Findings: No erythema or rash.  Neurological:     Mental Status: She is alert and oriented to person, place, and time.  Psychiatric:        Behavior: Behavior normal.        Thought Content: Thought content normal.        Judgment: Judgment normal.     Lab Results  Component Value Date   WBC 6.3 06/06/2021   HGB 12.5 06/06/2021   HCT 38.3 06/06/2021   MCV 88.0 06/06/2021   PLT 245 06/06/2021   Lab Results  Component Value Date   FERRITIN 73.1 02/16/2021   IRON 59 02/16/2021   TIBC 312.2 02/16/2021   UIBC 200 08/13/2017   IRONPCTSAT 18.9 (L) 02/16/2021   Lab Results  Component Value Date   RBC 4.35 06/06/2021   No results found for: KPAFRELGTCHN, LAMBDASER, KAPLAMBRATIO No results found for: IGGSERUM, IGA, IGMSERUM No results found for: [name], SPEI   Chemistry      Component Value Date/Time   NA 145 06/06/2021 1519   NA 143 02/16/2017 1450   NA 142 01/19/2016 1141   K 3.5 06/06/2021 1519   K 3.7 02/16/2017 1450   K 3.7 01/19/2016 1141   CL 107 06/06/2021 1519   CL 105 02/16/2017 1450   CO2 30 06/06/2021 1519   CO2 32 02/16/2017 1450   CO2 27 01/19/2016 1141   BUN 25 (H) 06/06/2021 1519   BUN 14 02/16/2017 1450   BUN 19.5 01/19/2016 1141   CREATININE 0.94 06/06/2021 1519   CREATININE 1.2 02/16/2017 1450   CREATININE 0.9 01/19/2016 1141      Component Value Date/Time   CALCIUM 8.6 (L) 06/06/2021 1519   CALCIUM 8.8 02/16/2017  1450   CALCIUM 8.8 01/19/2016 1141   ALKPHOS 52 06/06/2021 1519   ALKPHOS 57 02/16/2017 1450   ALKPHOS 59 01/19/2016 1141   AST 27 06/06/2021 1519   AST 21 01/19/2016 1141   ALT  17 06/06/2021 1519   ALT 27 02/16/2017 1450   ALT 18 01/19/2016 1141   BILITOT 0.4 06/06/2021 1519   BILITOT 0.31 01/19/2016 1141       Impression and Plan: Jennifer [name] is a very pleasant 71 yo African American female with history of recurrent PE.   Her initial thrombus was diagnosed in 2012 and then recurred in 2015.   She is on lifelong anticoagulation with Xarelto 5 mg PO daily and tolerating well.   I do feel bad for her sister.  Hopefully, she will get through this episode of heart failure.  I know she is in fantastic hands by the cardiologist over at [facility].  I will plan to see her back in another 6 months.  I think this would be a reasonable amount of follow-up time.   [name], MD 2/13/20234:04 PM

## 2021-06-06 NOTE — Progress Notes (Signed)
Chief Complaint:   OBESITY Jennifer [name] is here to discuss her progress with her obesity treatment plan along with follow-up of her obesity related diagnoses. [name] is on the Category 2 Plan and states she is following her eating plan approximately 99% of the time. [name] states she is doing 0 minutes 0 times per week.  Today's visit was #: 2 Starting weight: 255 lbs Starting date: 05/19/2021 Today's weight: 253 lbs Today's date: 06/02/2021 Total lbs lost to date: 2 lbs Total lbs lost since last in-office visit: 2 lbs  Interim History: [name] is down 2 lbs since her last visit. She worked hard on her diet.  Subjective:   1. Visceral obesity [name]'s visceral fat was 16.  2. Diabetes mellitus type 2 in obese [name]) Legend is currently not on medications. Her A1C was 6.3 last week. Her insulin was 12.4. Her C-peptide was 2.4.  3. Vitamin D deficiency Lakara's last Vitamin D was 42.2.  Assessment/Plan:   1. Visceral obesity Jasnoor will continue to reduce carbohydrates. She will increase activities and exercise.   2. Diabetes mellitus type 2 in obese [facility]) [name] will decrease carbohydrates and she will increase activities. Good blood sugar control is important to decrease the likelihood of diabetic complications such as nephropathy, neuropathy, limb loss, blindness, coronary artery disease, and death. Intensive lifestyle modification including diet, exercise and weight loss are the first line of treatment for diabetes.   3. Vitamin D deficiency Low Vitamin D level contributes to fatigue and are associated with obesity, breast, and colon cancer. Gearlene will continue over the counter Vitamin D 2,000 IU and she will follow-up for routine testing of Vitamin D, at least 2-3 times per year to avoid over-replacement.  4. Obesity, current BMI 38.6 Brookley is currently in the action stage of change. As such, her goal is to continue with weight loss efforts. She has agreed to the Category 2 Plan.   [name] will  continue meal planning and she will continue intentional eating. We reviewed labs from 05/19/2021 Vitamin D, insulin and C-peptide.   Exercise goals: No exercise has been prescribed at this time.  Behavioral modification strategies: increasing lean protein intake, decreasing simple carbohydrates, increasing vegetables, increasing water intake, decreasing eating out, no skipping meals, meal planning and cooking strategies, keeping healthy foods in the home, and planning for success.  [name] has agreed to follow-up with our clinic in 2 weeks. She was informed of the importance of frequent follow-up visits to maximize her success with intensive lifestyle modifications for her multiple health conditions.   Objective:   Blood pressure 135/82, pulse 88, temperature 98.2 F (36.8 C), height 5\' 8"  (1.727 m), weight 253 lb (114.8 kg), SpO2 97 %. Body mass index is 38.47 kg/m.  General: Cooperative, alert, well developed, in no acute distress. HEENT: Conjunctivae and lids unremarkable. Cardiovascular: Regular rhythm.  Lungs: Normal work of breathing. Neurologic: No focal deficits.   Lab Results  Component Value Date   CREATININE 0.92 04/26/2021   BUN 16 04/26/2021   NA 141 04/26/2021   K 3.7 04/26/2021   CL 103 04/26/2021   CO2 28 04/26/2021   Lab Results  Component Value Date   ALT 23 12/29/2020   AST 18 12/29/2020   ALKPHOS 55 12/29/2020   BILITOT 0.3 12/29/2020   Lab Results  Component Value Date   HGBA1C 6.3 04/26/2021   HGBA1C 6.0 (H) 12/29/2020   HGBA1C 5.9 (H) 08/27/2020   HGBA1C 5.6 04/29/2020   HGBA1C 6.0  11/19/2019   Lab Results  Component Value Date   INSULIN 12.6 05/19/2021   Lab Results  Component Value Date   TSH 2.85 04/26/2021   Lab Results  Component Value Date   CHOL 185 04/26/2021   HDL 68.10 04/26/2021   LDLCALC 98 04/26/2021   TRIG 91.0 04/26/2021   CHOLHDL 3 04/26/2021   Lab Results  Component Value Date   VD25OH 42.2 05/19/2021   Lab Results   Component Value Date   WBC 8.2 12/29/2020   HGB 12.0 02/16/2021   HCT 36.5 02/16/2021   MCV 87.4 12/29/2020   PLT 301 12/29/2020   Lab Results  Component Value Date   IRON 59 02/16/2021   TIBC 312.2 02/16/2021   FERRITIN 73.1 02/16/2021   Attestation Statements:   Reviewed by clinician on day of visit: allergies, medications, problem list, medical history, surgical history, family history, social history, and previous encounter notes.    I, Lizbeth Bark, RMA, am acting as Location manager for [employer], DO.  I have reviewed the above documentation for accuracy and completeness, and I agree with the above. Jearld Lesch, DO

## 2021-06-07 ENCOUNTER — Encounter (INDEPENDENT_AMBULATORY_CARE_PROVIDER_SITE_OTHER): Payer: Self-pay | Admitting: Bariatrics

## 2021-06-07 MED ORDER — ROSUVASTATIN CALCIUM 10 MG PO TABS: 10.0000 mg | ORAL_TABLET | Freq: Every day | ORAL | 0 refills | Status: DC

## 2021-06-08 ENCOUNTER — Ambulatory Visit (INDEPENDENT_AMBULATORY_CARE_PROVIDER_SITE_OTHER): Payer: Medicare Other | Admitting: Pulmonary Disease

## 2021-06-08 ENCOUNTER — Encounter: Payer: Self-pay | Admitting: Pulmonary Disease

## 2021-06-08 ENCOUNTER — Other Ambulatory Visit: Payer: Self-pay

## 2021-06-08 DIAGNOSIS — Z9989 Dependence on other enabling machines and devices: Secondary | ICD-10-CM | POA: Diagnosis not present

## 2021-06-08 DIAGNOSIS — G4721 Circadian rhythm sleep disorder, delayed sleep phase type: Secondary | ICD-10-CM | POA: Diagnosis not present

## 2021-06-08 DIAGNOSIS — G4733 Obstructive sleep apnea (adult) (pediatric): Secondary | ICD-10-CM

## 2021-06-08 DIAGNOSIS — R053 Chronic cough: Secondary | ICD-10-CM | POA: Diagnosis not present

## 2021-06-08 NOTE — Patient Instructions (Signed)
°  X CPAP report in 1 month. IF events persist, then we proceed with CPAP titration study  To phase shift earlier, set bedtime at 3 am - wake up at 11 am with alarm Melatonin 5 mg at MN  - light exposure x 30 mins in daytime  If this works x 2 weeks, set earlier bedtime x 1 h

## 2021-06-08 NOTE — Assessment & Plan Note (Signed)
She appears to have delayed sleep phase syndrome. We discussed measures to get her body clock back .  We could aim to get her bedtime down to between midnight and 2 AM  To phase shift earlier, set bedtime at 3 am - wake up at 11 am with alarm Melatonin 5 mg at MN  - light exposure x 30 mins in daytime  If this works x 2 weeks, set earlier bedtime x 1 h

## 2021-06-08 NOTE — Progress Notes (Signed)
° °  Subjective:    Patient ID: Jennifer [name], female    DOB: [birth year], 71 y.o.   MRN: [id number]  HPI  71 yo never smoker for FU of  positional obstructive sleep apnea    She has mild intermittent cough attributed to GERD/achalasia - s/p Nisen's x 3 -last dilation 2018 by Carlean Purl, 03/2020 , 04/2021 (nandigam) , chronic dysphagia      PMH - PE in 12/2013 on Xarelto .   episodes of cough syncope -resolved     68-month follow-up visit On her last visit, she had a flareup of chronic cough ongoing since May 2022.  She previously had a cough that has been attributed to GERD.  She has chronic esophageal stricture last dilatation was 03/2020, she had a recent GI evaluation.  She continues to have chronic dysphagia.   She had been treated with Z-Pak and regular course of Augmentin and referred her to GI , she had another esophagram which showed evidence of residual stricture, fundoplication was intact without hiatal hernia.  She underwent another dilatation procedure 05/02/2021  Her cough is now much improved  We also sent a prescription for new auto CPAP 12 -20 cm which she obtained. She still feels sleepy. She admits to daily at bedtime as late as 5 AM and sleeps until noon. She has been found to be vitamin D deficient Reports sleepiness and tiredness during the day       Significant tests/ events reviewed  Head CT 01/2020  minimal ethmoid sinus thickening.  PSG 01/2003 -wt 210 lbs -TST of 250 mins incl 37 mins of REM, RDI was 15/h with lowest desaturation of 84% & moderate snoring, predominantly REM related events.  PSG 07/2009 (256 lbs )>>Mild-to-moderate obstructive sleep apnea - RDI was 17/h, non supine AHI was only 1.7/h    6/ 2011- HST AHI of 10/h with desaturation index 12/h .  psg (250 lbs) aug'11 - cpap titrated to 9 cm, small full face mask   Review of Systems neg for any significant sore throat, dysphagia, itching, sneezing, nasal congestion or excess/ purulent secretions,  fever, chills, sweats, unintended wt loss, pleuritic or exertional cp, hempoptysis, orthopnea pnd or change in chronic leg swelling. Also denies presyncope, palpitations, heartburn, abdominal pain, nausea, vomiting, diarrhea or change in bowel or urinary habits, dysuria,hematuria, rash, arthralgias, visual complaints, headache, numbness weakness or ataxia.     Objective:   Physical Exam  Gen. Pleasant, obese, in no distress ENT - no lesions, no post nasal drip Neck: No JVD, no thyromegaly, no carotid bruits Lungs: no use of accessory muscles, no dullness to percussion, decreased without rales or rhonchi  Cardiovascular: Rhythm regular, heart sounds  normal, no murmurs or gallops, no peripheral edema Musculoskeletal: No deformities, no cyanosis or clubbing , no tremors        Assessment & Plan:

## 2021-06-08 NOTE — Assessment & Plan Note (Signed)
CPAP download was reviewed which shows residual AHI of 9.2/hour with average pressure of 18 to 19 cm with excellent compliance more than 6 hours every night and minimal leak. Events seem to be mainly obstructive. We will repeat CPAP download in 1 month, if persistent residual obstructive events are noted, we will set her up for formal attended titration study  Weight loss encouraged, compliance with goal of at least 4-6 hrs every night is the expectation. Advised against medications with sedative side effects Cautioned against driving when sleepy - understanding that sleepiness will vary on a day to day basis

## 2021-06-08 NOTE — Assessment & Plan Note (Signed)
Appears to be related to esophageal issues with intermittent flares, now quiescent

## 2021-06-23 ENCOUNTER — Other Ambulatory Visit: Payer: Self-pay | Admitting: Hematology & Oncology

## 2021-06-27 ENCOUNTER — Ambulatory Visit (INDEPENDENT_AMBULATORY_CARE_PROVIDER_SITE_OTHER): Payer: Medicare Other | Admitting: Bariatrics

## 2021-06-27 ENCOUNTER — Telehealth: Payer: Self-pay | Admitting: Internal Medicine

## 2021-06-27 DIAGNOSIS — E119 Type 2 diabetes mellitus without complications: Secondary | ICD-10-CM | POA: Diagnosis not present

## 2021-06-27 DIAGNOSIS — H40023 Open angle with borderline findings, high risk, bilateral: Secondary | ICD-10-CM | POA: Diagnosis not present

## 2021-06-27 DIAGNOSIS — H35371 Puckering of macula, right eye: Secondary | ICD-10-CM | POA: Diagnosis not present

## 2021-06-27 DIAGNOSIS — Z79899 Other long term (current) drug therapy: Secondary | ICD-10-CM

## 2021-06-27 DIAGNOSIS — H26493 Other secondary cataract, bilateral: Secondary | ICD-10-CM | POA: Diagnosis not present

## 2021-06-27 DIAGNOSIS — F419 Anxiety disorder, unspecified: Secondary | ICD-10-CM

## 2021-06-27 LAB — HM DIABETES EYE EXAM

## 2021-06-27 NOTE — Telephone Encounter (Signed)
Requesting: temazepam '30mg'$   ?Contract: 02/11/2020 ?UDS: Ordered 04/26/21, not done- mychart message sent ?Last Visit: 04/26/2021 ?Next Visit: 10/24/2021 ?Last Refill: 03/21/2021 #30 and 2RF ? ?Please Advise ? ?

## 2021-06-27 NOTE — Telephone Encounter (Signed)
PDMP okay, Rx sent 

## 2021-06-28 ENCOUNTER — Encounter: Payer: Self-pay | Admitting: Gastroenterology

## 2021-06-28 ENCOUNTER — Other Ambulatory Visit (INDEPENDENT_AMBULATORY_CARE_PROVIDER_SITE_OTHER): Payer: Medicare Other

## 2021-06-28 ENCOUNTER — Ambulatory Visit (INDEPENDENT_AMBULATORY_CARE_PROVIDER_SITE_OTHER): Payer: Medicare Other | Admitting: Gastroenterology

## 2021-06-28 ENCOUNTER — Telehealth: Payer: Self-pay | Admitting: Pulmonary Disease

## 2021-06-28 VITALS — BP 110/66 | HR 70 | Ht 68.0 in | Wt 254.0 lb

## 2021-06-28 DIAGNOSIS — R109 Unspecified abdominal pain: Secondary | ICD-10-CM

## 2021-06-28 DIAGNOSIS — R1310 Dysphagia, unspecified: Secondary | ICD-10-CM

## 2021-06-28 DIAGNOSIS — R319 Hematuria, unspecified: Secondary | ICD-10-CM

## 2021-06-28 DIAGNOSIS — K219 Gastro-esophageal reflux disease without esophagitis: Secondary | ICD-10-CM

## 2021-06-28 LAB — CBC WITH DIFFERENTIAL/PLATELET
Basophils Absolute: 0 10*3/uL (ref 0.0–0.1)
Basophils Relative: 1 % (ref 0.0–3.0)
Eosinophils Absolute: 0.2 10*3/uL (ref 0.0–0.7)
Eosinophils Relative: 4.2 % (ref 0.0–5.0)
HCT: 36.5 % (ref 36.0–46.0)
Hemoglobin: 12.3 g/dL (ref 12.0–15.0)
Lymphocytes Relative: 28.7 % (ref 12.0–46.0)
Lymphs Abs: 1.3 10*3/uL (ref 0.7–4.0)
MCHC: 33.7 g/dL (ref 30.0–36.0)
MCV: 86 fl (ref 78.0–100.0)
Monocytes Absolute: 0.4 10*3/uL (ref 0.1–1.0)
Monocytes Relative: 10.2 % (ref 3.0–12.0)
Neutro Abs: 2.5 10*3/uL (ref 1.4–7.7)
Neutrophils Relative %: 55.9 % (ref 43.0–77.0)
Platelets: 243 10*3/uL (ref 150.0–400.0)
RBC: 4.25 Mil/uL (ref 3.87–5.11)
RDW: 15 % (ref 11.5–15.5)
WBC: 4.4 10*3/uL (ref 4.0–10.5)

## 2021-06-28 LAB — COMPREHENSIVE METABOLIC PANEL
ALT: 23 U/L (ref 0–35)
AST: 24 U/L (ref 0–37)
Albumin: 4.1 g/dL (ref 3.5–5.2)
Alkaline Phosphatase: 57 U/L (ref 39–117)
BUN: 20 mg/dL (ref 6–23)
CO2: 31 mEq/L (ref 19–32)
Calcium: 9.2 mg/dL (ref 8.4–10.5)
Chloride: 102 mEq/L (ref 96–112)
Creatinine, Ser: 0.89 mg/dL (ref 0.40–1.20)
GFR: 66.57 mL/min (ref >60.00)
Glucose, Bld: 95 mg/dL (ref 70–99)
Potassium: 4 mEq/L (ref 3.5–5.1)
Sodium: 140 mEq/L (ref 135–145)
Total Bilirubin: 0.3 mg/dL (ref 0.2–1.2)
Total Protein: 7.6 g/dL (ref 6.0–8.3)

## 2021-06-28 LAB — URINALYSIS, ROUTINE W REFLEX MICROSCOPIC
Bilirubin Urine: NEGATIVE
Ketones, ur: NEGATIVE
Leukocytes,Ua: NEGATIVE
Nitrite: NEGATIVE
Specific Gravity, Urine: 1.01 (ref 1.000–1.030)
Total Protein, Urine: NEGATIVE
Urine Glucose: NEGATIVE
Urobilinogen, UA: 0.2 (ref 0.0–1.0)
pH: 5.5 (ref 5.0–8.0)

## 2021-06-28 MED ORDER — ACIPHEX 20 MG PO TBEC: 20.0000 mg | DELAYED_RELEASE_TABLET | Freq: Two times a day (BID) | ORAL | 11 refills | Status: DC

## 2021-06-28 NOTE — Patient Instructions (Signed)
You have been scheduled for a CT scan of the abdomen and pelvis at [facility], 1st floor Radiology. You are scheduled on 07/08/2021  at 11:00am. You should arrive 15 minutes prior to your appointment time for registration.  Please pick up 2 bottles of contrast from [place] [facility] at least 3 days prior to your scan. The solution may taste better if refrigerated, but do NOT add ice or any other liquid to this solution. Shake well before drinking.   Please follow the written instructions below on the day of your exam:   1) Do not eat anything after 7am (4 hours prior to your test)   2) Drink 1 bottle of contrast @ 9am (2 hours prior to your exam)  Remember to shake well before drinking and do NOT pour over ice.     Drink 1 bottle of contrast @ 10am (1 hour prior to your exam)   You may take any medications as prescribed with a small amount of water, if necessary. If you take any of the following medications: METFORMIN, GLUCOPHAGE, GLUCOVANCE, AVANDAMET, RIOMET, FORTAMET, [place] MET, JANUMET, GLUMETZA or METAGLIP, you MAY be asked to HOLD this medication 48 hours AFTER the exam.   The purpose of you drinking the oral contrast is to aid in the visualization of your intestinal tract. The contrast solution may cause some diarrhea. Depending on your individual set of symptoms, you may also receive an intravenous injection of x-ray contrast/dye. Plan on being at [facility] for 45 minutes or longer, depending on the type of exam you are having performed.   If you have any questions regarding your exam or if you need to reschedule, you may call [facility] at [phone] between the hours of 8:00 am and 5:00 pm, Monday-Friday.     Your provider has requested that you go to the basement level for lab work before leaving today. Press "B" on the elevator. The lab is located at the first door on the left as you exit the elevator.   We have sent the following medications to your pharmacy for  you to pick up at your convenience: Aciphex   Gastroesophageal Reflux Disease, Adult Gastroesophageal reflux (GER) happens when acid from the stomach flows up into the tube that connects the mouth and the stomach (esophagus). Normally, food travels down the esophagus and stays in the stomach to be digested. However, when a person has GER, food and stomach acid sometimes move back up into the esophagus. If this becomes a more serious problem, the person may be diagnosed with a disease called gastroesophageal reflux disease (GERD). GERD occurs when the reflux: Happens often. Causes frequent or severe symptoms. Causes problems such as damage to the esophagus. When stomach acid comes in contact with the esophagus, the acid may cause inflammation in the esophagus. Over time, GERD may create small holes (ulcers) in the lining of the esophagus. What are the causes? This condition is caused by a problem with the muscle between the esophagus and the stomach (lower esophageal sphincter, or LES). Normally, the LES muscle closes after food passes through the esophagus to the stomach. When the LES is weakened or abnormal, it does not close properly, and that allows food and stomach acid to go back up into the esophagus. The LES can be weakened by certain dietary substances, medicines, and medical conditions, including: Tobacco use. Pregnancy. Having a hiatal hernia. Alcohol use. Certain foods and beverages, such as coffee, chocolate, onions, and peppermint. What increases  the risk? You are more likely to develop this condition if you: Have an increased body weight. Have a connective tissue disorder. Take NSAIDs, such as ibuprofen. What are the signs or symptoms? Symptoms of this condition include: Heartburn. Difficult or painful swallowing and the feeling of having a lump in the throat. A bitter taste in the mouth. Bad breath and having a large amount of saliva. Having an upset or bloated stomach and  belching. Chest pain. Different conditions can cause chest pain. Make sure you see your health care provider if you experience chest pain. Shortness of breath or wheezing. Ongoing (chronic) cough or a nighttime cough. Wearing away of tooth enamel. Weight loss. How is this diagnosed? This condition may be diagnosed based on a medical history and a physical exam. To determine if you have mild or severe GERD, your health care provider may also monitor how you respond to treatment. You may also have tests, including: A test to examine your stomach and esophagus with a small camera (endoscopy). A test that measures the acidity level in your esophagus. A test that measures how much pressure is on your esophagus. A barium swallow or modified barium swallow test to show the shape, size, and functioning of your esophagus. How is this treated? Treatment for this condition may vary depending on how severe your symptoms are. Your health care provider may recommend: Changes to your diet. Medicine. Surgery. The goal of treatment is to help relieve your symptoms and to prevent complications. Follow these instructions at home: Eating and drinking  Follow a diet as recommended by your health care provider. This may involve avoiding foods and drinks such as: Coffee and tea, with or without caffeine. Drinks that contain alcohol. Energy drinks and sports drinks. Carbonated drinks or sodas. Chocolate and cocoa. Peppermint and mint flavorings. Garlic and onions. Horseradish. Spicy and acidic foods, including peppers, chili powder, curry powder, vinegar, hot sauces, and barbecue sauce. Citrus fruit juices and citrus fruits, such as oranges, lemons, and limes. Tomato-based foods, such as red sauce, chili, salsa, and pizza with red sauce. Fried and fatty foods, such as donuts, french fries, potato chips, and high-fat dressings. High-fat meats, such as hot dogs and fatty cuts of red and white meats, such as  rib eye steak, sausage, ham, and bacon. High-fat dairy items, such as whole milk, butter, and cream cheese. Eat small, frequent meals instead of large meals. Avoid drinking large amounts of liquid with your meals. Avoid eating meals during the 2-3 hours before bedtime. Avoid lying down right after you eat. Do not exercise right after you eat. Lifestyle  Do not use any products that contain nicotine or tobacco. These products include cigarettes, chewing tobacco, and vaping devices, such as e-cigarettes. If you need help quitting, ask your health care provider. Try to reduce your stress by using methods such as yoga or meditation. If you need help reducing stress, ask your health care provider. If you are overweight, reduce your weight to an amount that is healthy for you. Ask your health care provider for guidance about a safe weight loss goal. General instructions Pay attention to any changes in your symptoms. Take over-the-counter and prescription medicines only as told by your health care provider. Do not take aspirin, ibuprofen, or other NSAIDs unless your health care provider told you to take these medicines. Wear loose-fitting clothing. Do not wear anything tight around your waist that causes pressure on your abdomen. Raise (elevate) the head of your bed about 6  inches (15 cm). You can use a wedge to do this. Avoid bending over if this makes your symptoms worse. Keep all follow-up visits. This is important. Contact a health care provider if: You have: New symptoms. Unexplained weight loss. Difficulty swallowing or it hurts to swallow. Wheezing or a persistent cough. A hoarse voice. Your symptoms do not improve with treatment. Get help right away if: You have sudden pain in your arms, neck, jaw, teeth, or back. You suddenly feel sweaty, dizzy, or light-headed. You have chest pain or shortness of breath. You vomit and the vomit is green, yellow, or black, or it looks like blood or  coffee grounds. You faint. You have stool that is red, bloody, or black. You cannot swallow, drink, or eat. These symptoms may represent a serious problem that is an emergency. Do not wait to see if the symptoms will go away. Get medical help right away. Call your local emergency services (911 in the [place] Do not drive yourself to the hospital. Summary Gastroesophageal reflux happens when acid from the stomach flows up into the esophagus. GERD is a disease in which the reflux happens often, causes frequent or severe symptoms, or causes problems such as damage to the esophagus. Treatment for this condition may vary depending on how severe your symptoms are. Your health care provider may recommend diet and lifestyle changes, medicine, or surgery. Contact a health care provider if you have new or worsening symptoms. Take over-the-counter and prescription medicines only as told by your health care provider. Do not take aspirin, ibuprofen, or other NSAIDs unless your health care provider told you to do so. Keep all follow-up visits as told by your health care provider. This is important. This information is not intended to replace advice given to you by your health care provider. Make sure you discuss any questions you have with your health care provider. Document Revised: 10/20/2019 Document Reviewed: 10/20/2019 Elsevier Patient Education  2022 [employer].  Due to recent changes in healthcare laws, you may see the results of your imaging and laboratory studies on MyChart before your provider has had a chance to review them.  We understand that in some cases there may be results that are confusing or concerning to you. Not all laboratory results come back in the same time frame and the provider may be waiting for multiple results in order to interpret others.  Please give [place] 48 hours in order for your provider to thoroughly review all the results before contacting the office for clarification of your  results.    I appreciate the  opportunity to care for you  Thank You   Harl Bowie , MD

## 2021-06-28 NOTE — Telephone Encounter (Signed)
Called patient and she states that she is just confused with the CPAP readings. And I informed her that Dr Elsworth Soho at her follow up will go over the download that we pull for her CPAP machine. ? ?DME: [facility] ? ?Jennifer [name] further  ?

## 2021-06-28 NOTE — Progress Notes (Unsigned)
Jennifer [name]    [id number]    [birth year]  Primary Care Physician:Paz, Alda Berthold, MD  Referring Physician: Colon Branch, MD [address] [place],  [place] [address]   Chief complaint:  GERD, Dysphagia  HPI:  EGD 05/02/2021 - LA Grade C esophagitis with no bleeding. Biopsied. - Esophageal mucosal changes suspicious for short-segment Barrett's esophagus. Biopsied. - A Nissen fundoplication was found. The wrap appears intact. - Gastritis. Biopsied. - Normal examined duodenum.  Outpatient Encounter Medications as of 06/28/2021  Medication Sig   acetaminophen (TYLENOL) 500 MG tablet Take 1 tablet (500 mg total) by mouth every 6 (six) hours as needed.   ACIPHEX 20 MG tablet TAKE ONE TABLET BY MOUTH TWICE DAILY   Apoaequorin (PREVAGEN) 10 MG CAPS Take 10 mg by mouth daily. prevagen   cycloSPORINE (RESTASIS) 0.05 % ophthalmic emulsion Restasis 0.05 % eye drops in a dropperette  INSTILL 1 DROP INTO EACH EYE TWICE DAILY   hyoscyamine (LEVSIN SL) 0.125 MG SL tablet DISSOLVE ONE TABLET IN MOUTH EVERY 4 HOURS AS NEEDED   Multiple Vitamins-Minerals (MULTIVITAMIN WITH MINERALS) tablet Take 1 tablet by mouth daily. Rainbow light   rosuvastatin (CRESTOR) 10 MG tablet Take 1 tablet (10 mg total) by mouth at bedtime.   temazepam (RESTORIL) 30 MG capsule TAKE ONE CAPSULE BY MOUTH AT BEDTIME AS NEEDED FOR SLEEP   triamterene-hydrochlorothiazide (MAXZIDE-25) 37.5-25 MG tablet TAKE HALF TABLET BY MOUTH DAILY   XARELTO 2.5 MG TABS tablet TAKE TWO TABLETS BY MOUTH DAILY   No facility-administered encounter medications on file as of 06/28/2021.    Allergies as of 06/28/2021 - Review Complete 06/08/2021  Allergen Reaction Noted   Morphine and related Anaphylaxis and Shortness Of Breath 12/01/2013   Sulfonamide derivatives Hives 12/02/2018   Promethazine hcl Other (See Comments) 12/02/2018    Past Medical History:  Diagnosis Date   Allergy    Anemia    Arthritis    Cataract     bil cataracts removed   Chronic chest pain    Chronic fatigue 02/03/2017   Clotting disorder [facility])    Pulmonary Embolis 2010 & 2015   Colon polyps    inflamed partially ulcerated hyperplastic polyp   Diabetes ([place]) 09/24/2013   no meds   Diverticulosis    Dysphagia esophageal phase - chronic after 3 fundoplications 04/88/8916   Esophageal stenosis    Stricture after fundoplication REDO   Fibromyalgia    GERD (gastroesophageal reflux disease)    Glaucoma suspect    Heart murmur    no problems    Hemorrhoids, internal, with bleeding 06/27/2017   History of hiatal hernia    Hypertension    IBS (irritable bowel syndrome)    Lactose intolerance    Mitral valve prolapse    OSA (obstructive sleep apnea)    on CPAP-Mild - Sleep study 2004   Pre-diabetes    Pulmonary embolus ([place])    2010 and 12-2013   Sleep apnea    wears CPAP   Thyroid nodule      dr. watching    Tinnitus    Subjective   [place] 2014   R flank    Past Surgical History:  Procedure Laterality Date   ABDOMINAL HYSTERECTOMY     still has 1 ovary    [place]   Stem surgery, Arnold Chiari Malformation   CATARACT EXTRACTION Bilateral 2017  cataracts, Dr. Herbert Deaner   CHOLECYSTECTOMY     COLONOSCOPY     ESOPHAGEAL MANOMETRY N/A 08/06/2019   Procedure: ESOPHAGEAL MANOMETRY (EM);  Surgeon: Gatha Mayer, MD;  Location: [facility];  Service: Endoscopy;  Laterality: N/A;   HEMORRHOID BANDING     HERNIA REPAIR     Hital Hernia   KNEE ARTHROSCOPY  05/16/2012   Procedure: ARTHROSCOPY KNEE;  Surgeon: Sharmon Revere, MD;  Location: [facility];  Service: Orthopedics;  Laterality: Left;   KNEE ARTHROSCOPY Right 27/5170   NISSEN FUNDOPLICATION     X 3 lab, open x 2 last with mesh   POLYPECTOMY     REDUCTION MAMMAPLASTY Bilateral 2007   TOTAL KNEE ARTHROPLASTY Right 08/25/2019   Procedure: RIGHT TOTAL KNEE ARTHROPLASTY;  Surgeon: Gaynelle Arabian, MD;  Location: [facility];  Service:  Orthopedics;  Laterality: Right;  25mn   UPPER GASTROINTESTINAL ENDOSCOPY      Family History  Problem Relation Age of Onset   Diabetes Mother    Rheum arthritis Mother    Hypertension Mother    Hypertension Father    Alcoholism Father    Rheum arthritis Sister    Hypertension Sister    Rheum arthritis Sister    Rheum arthritis Sister    CAD Sister        MI age 69  Fibromyalgia Sister    Colon cancer Maternal Grandmother        dx with colon ca laster in life - lived to be 151  Crohn's disease Maternal Aunt    Stomach cancer Maternal Uncle    Lung cancer Maternal Uncle    Breast cancer Neg Hx    Esophageal cancer Neg Hx    Rectal cancer Neg Hx    Prostate cancer Neg Hx    Pancreatic cancer Neg Hx    Colon polyps Neg Hx     Social History   Socioeconomic History   Marital status: Married    Spouse name: Not on file   Number of children: 1   Years of education: Not on file   Highest education level: Not on file  Occupational History   Occupation: retired form the [employer] 04-2015    Employer: [employer]  Tobacco Use   Smoking status: Never   Smokeless tobacco: Never   Tobacco comments:    never used tobacco  Vaping Use   Vaping Use: Never used  Substance and Sexual Activity   Alcohol use: No    Alcohol/week: 0.0 standard drinks   Drug use: No   Sexual activity: Not Currently    Birth control/protection: None  Other Topics Concern   Not on file  Social History Narrative   ESport and exercise psychologist- Technical school   Married '70 for 2 years, divorced; re-married '86 for 2.5 years, divorced; re-married '90 for 1 year, divorced; re-married '00   1 son - '732  Sister - Died @ [address] [place]       Social Determinants of HRadio broadcast assistantStrain: Not on file  Food Insecurity: Not on file  Transportation Needs: Not on file  Physical Activity: Not on file  Stress: Not on file  Social Connections: Not on file  Intimate Partner Violence: Not on file       Review of systems: All other review of systems negative except as mentioned in the HPI.   Physical Exam: There were no vitals filed for this visit. There is no height  or weight on file to calculate BMI. Gen:      No acute distress HEENT:  sclera anicteric Abd:      soft, non-tender; no palpable masses, no distension Ext:    No edema Neuro: alert and oriented x 3 Psych: normal mood and affect  Data Reviewed:  Reviewed labs, radiology imaging, old records and pertinent past GI work up   Assessment and Plan/Recommendations:  ***  This visit required *** minutes of patient care (this includes precharting, chart review, review of results, face-to-face time used for counseling as well as treatment plan and follow-up. The patient was provided an opportunity to ask questions and all were answered. The patient agreed with the plan and demonstrated an understanding of the instructions.  Damaris Hippo , MD    CC: Colon Branch, MD

## 2021-06-29 ENCOUNTER — Encounter: Payer: Self-pay | Admitting: Gastroenterology

## 2021-06-29 ENCOUNTER — Encounter: Payer: Self-pay | Admitting: Internal Medicine

## 2021-06-29 LAB — URINE CULTURE
MICRO NUMBER:: 13098132
SPECIMEN QUALITY:: ADEQUATE

## 2021-06-30 ENCOUNTER — Telehealth: Payer: Self-pay | Admitting: Gastroenterology

## 2021-06-30 NOTE — Telephone Encounter (Signed)
We can hold off CT if symptoms are not improving.  Beets can cause discoloration of urine but will not lead to detection of hemoglobin.  She should still follow-up with PCP ?

## 2021-06-30 NOTE — Telephone Encounter (Signed)
Patient message sent to her. ?

## 2021-06-30 NOTE — Telephone Encounter (Signed)
Patient called back and stated she was asleep when you called earlier.  She said if you would please call her back because she has a few more questions.  Thank you. ?

## 2021-06-30 NOTE — Telephone Encounter (Signed)
The patient is scheduled for her CT A/P on 07/08/21. States she is feeling better. She does not have any redness of the urine. She was eating beets and believes this caused the color of the urine to change. She stomach is "still not right, but it is better."  She wants to know if the CT is needed if she continues to improve.  ?

## 2021-07-01 ENCOUNTER — Telehealth: Payer: Self-pay | Admitting: *Deleted

## 2021-07-01 NOTE — Telephone Encounter (Signed)
Sent in prior authorization for Aciphex waiting on response  ?

## 2021-07-04 ENCOUNTER — Other Ambulatory Visit: Payer: Self-pay

## 2021-07-04 ENCOUNTER — Ambulatory Visit (INDEPENDENT_AMBULATORY_CARE_PROVIDER_SITE_OTHER): Payer: Medicare Other | Admitting: Bariatrics

## 2021-07-04 ENCOUNTER — Encounter (INDEPENDENT_AMBULATORY_CARE_PROVIDER_SITE_OTHER): Payer: Self-pay | Admitting: Bariatrics

## 2021-07-04 VITALS — HR 91 | Temp 98.2°F | Ht 68.0 in | Wt 252.0 lb

## 2021-07-04 DIAGNOSIS — Z6838 Body mass index (BMI) 38.0-38.9, adult: Secondary | ICD-10-CM

## 2021-07-04 DIAGNOSIS — Z9989 Dependence on other enabling machines and devices: Secondary | ICD-10-CM | POA: Diagnosis not present

## 2021-07-04 DIAGNOSIS — E669 Obesity, unspecified: Secondary | ICD-10-CM | POA: Diagnosis not present

## 2021-07-04 DIAGNOSIS — I10 Essential (primary) hypertension: Secondary | ICD-10-CM

## 2021-07-04 DIAGNOSIS — G4733 Obstructive sleep apnea (adult) (pediatric): Secondary | ICD-10-CM | POA: Diagnosis not present

## 2021-07-04 NOTE — Telephone Encounter (Signed)
Had a fax request from [facility] to fill out for patients Aciphex. Filled out today and faxed back to [phone]   Still waiting on an approval   ?

## 2021-07-05 ENCOUNTER — Encounter: Payer: Self-pay | Admitting: Gastroenterology

## 2021-07-05 NOTE — Progress Notes (Signed)
? ? ? ?Chief Complaint:  ? ?OBESITY ?Jennifer [name] is here to discuss her progress with her obesity treatment plan along with follow-up of her obesity related diagnoses. Jennifer [name] is on the Category 2 Plan and states she is following her eating plan approximately 95% of the time. Jennifer [name] states she is doing 0 minutes 0 times per week. ? ?Today's visit was #: 3 ?Starting weight: 255 lbs ?Starting date: 05/19/2021 ?Today's weight: 252 lbs ?Today's date: 07/05/2021 ?Total lbs lost to date: 3 lbs ?Total lbs lost since last in-office visit: 1 lb ? ?Interim History: Jennifer [name] is down 1 additional pound since her last visit. She states that  she is getting adequate water.  She is retaining about 2 lbs of water.  ? ?Subjective:  ? ?1. Essential hypertension ?Jennifer [name] blood pressure is slightly elevated today.  ? ?2. OSA on CPAP ? ? ?Assessment/Plan:  ? ?1. Essential hypertension ?Jennifer [name] will continue taking her medications. She is working on healthy weight loss and exercise to improve blood pressure control. We will watch for signs of hypotension as she continues her lifestyle modifications. ? ?2. OSA on CPAP ?Having restful sleep.  ? ?3. Obesity, current BMI 38.4 ?Jennifer [name] is currently in the action stage of change. As such, her goal is to continue with weight loss efforts. She has agreed to the Category 2 Plan and keeping a food journal and adhering to recommended goals of 1200 calories and 90 grams of protein.  ? ?Jennifer [name] will continue to follow the plan 90-95%.  ? ?Exercise goals:  Jennifer [name] will start walking. She is using her Cubii 3 times per week.  ? ?Behavioral modification strategies: increasing lean protein intake, decreasing simple carbohydrates, increasing vegetables, increasing water intake, decreasing eating out, no skipping meals, meal planning and cooking strategies, keeping healthy foods in the home, and planning for success. ? ?Jennifer [name] has agreed to follow-up with our clinic in 3 weeks. She was informed of the importance of frequent follow-up  visits to maximize her success with intensive lifestyle modifications for her multiple health conditions.  ? ?Objective:  ? ?Pulse 91, temperature 98.2 ?F (36.8 ?C), height '5\' 8"'$  (1.727 m), weight 252 lb (114.3 kg), SpO2 95 %. ?Body mass index is 38.32 kg/m?. ? ?General: Cooperative, alert, well developed, in no acute distress. ?HEENT: Conjunctivae and lids unremarkable. ?Cardiovascular: Regular rhythm.  ?Lungs: Normal work of breathing. ?Neurologic: No focal deficits.  ? ?Lab Results  ?Component Value Date  ? CREATININE 0.89 06/28/2021  ? BUN 20 06/28/2021  ? NA 140 06/28/2021  ? K 4.0 06/28/2021  ? CL 102 06/28/2021  ? CO2 31 06/28/2021  ? ?Lab Results  ?Component Value Date  ? ALT 23 06/28/2021  ? AST 24 06/28/2021  ? ALKPHOS 57 06/28/2021  ? BILITOT 0.3 06/28/2021  ? ?Lab Results  ?Component Value Date  ? HGBA1C 6.3 04/26/2021  ? HGBA1C 6.0 (H) 12/29/2020  ? HGBA1C 5.9 (H) 08/27/2020  ? HGBA1C 5.6 04/29/2020  ? HGBA1C 6.0 11/19/2019  ? ?Lab Results  ?Component Value Date  ? INSULIN 12.6 05/19/2021  ? ?Lab Results  ?Component Value Date  ? TSH 2.85 04/26/2021  ? ?Lab Results  ?Component Value Date  ? CHOL 185 04/26/2021  ? HDL 68.10 04/26/2021  ? [place] 98 04/26/2021  ? TRIG 91.0 04/26/2021  ? CHOLHDL 3 04/26/2021  ? ?Lab Results  ?Component Value Date  ? VD25OH 42.2 05/19/2021  ? ?Lab Results  ?Component Value Date  ? WBC 4.4 06/28/2021  ? HGB 12.3  06/28/2021  ? HCT 36.5 06/28/2021  ? MCV 86.0 06/28/2021  ? PLT 243.0 06/28/2021  ? ?Lab Results  ?Component Value Date  ? IRON 59 02/16/2021  ? TIBC 312.2 02/16/2021  ? FERRITIN 73.1 02/16/2021  ? ?Attestation Statements:  ? ?Reviewed by clinician on day of visit: allergies, medications, problem list, medical history, surgical history, family history, social history, and previous encounter notes. ? ?I, Jennifer [name], RMA, am acting as transcriptionist for [employer], DO. ? ?I have reviewed the above documentation for accuracy and completeness, and I agree with the  above. Jennifer Lesch, DO  ?

## 2021-07-06 ENCOUNTER — Encounter: Payer: Self-pay | Admitting: Pulmonary Disease

## 2021-07-06 ENCOUNTER — Encounter (INDEPENDENT_AMBULATORY_CARE_PROVIDER_SITE_OTHER): Payer: Self-pay | Admitting: Bariatrics

## 2021-07-06 MED ORDER — ACIPHEX 20 MG PO TBEC: 20.0000 mg | DELAYED_RELEASE_TABLET | Freq: Two times a day (BID) | ORAL | 11 refills | Status: DC

## 2021-07-06 NOTE — Telephone Encounter (Signed)
Aciphex approved from 07/05/2021 - 07-05-2022  Sent approval to be scanned in ?

## 2021-07-07 ENCOUNTER — Encounter (INDEPENDENT_AMBULATORY_CARE_PROVIDER_SITE_OTHER): Payer: Self-pay | Admitting: Bariatrics

## 2021-07-08 ENCOUNTER — Encounter: Payer: Self-pay | Admitting: Family

## 2021-07-08 ENCOUNTER — Ambulatory Visit (HOSPITAL_COMMUNITY)
Admission: RE | Admit: 2021-07-08 | Discharge: 2021-07-08 | Disposition: A | Discharge status: Home / Self Care | Payer: Medicare Other | Source: Ambulatory Visit | Attending: Gastroenterology | Admitting: Gastroenterology

## 2021-07-08 ENCOUNTER — Other Ambulatory Visit: Payer: Self-pay

## 2021-07-08 DIAGNOSIS — K5730 Diverticulosis of large intestine without perforation or abscess without bleeding: Secondary | ICD-10-CM | POA: Diagnosis not present

## 2021-07-08 DIAGNOSIS — R1310 Dysphagia, unspecified: Secondary | ICD-10-CM | POA: Insufficient documentation

## 2021-07-08 DIAGNOSIS — R319 Hematuria, unspecified: Secondary | ICD-10-CM | POA: Diagnosis not present

## 2021-07-08 DIAGNOSIS — K219 Gastro-esophageal reflux disease without esophagitis: Secondary | ICD-10-CM | POA: Diagnosis not present

## 2021-07-08 DIAGNOSIS — R109 Unspecified abdominal pain: Secondary | ICD-10-CM | POA: Insufficient documentation

## 2021-07-08 DIAGNOSIS — K429 Umbilical hernia without obstruction or gangrene: Secondary | ICD-10-CM | POA: Diagnosis not present

## 2021-07-08 DIAGNOSIS — K5900 Constipation, unspecified: Secondary | ICD-10-CM | POA: Diagnosis not present

## 2021-07-08 DIAGNOSIS — K838 Other specified diseases of biliary tract: Secondary | ICD-10-CM | POA: Diagnosis not present

## 2021-07-13 ENCOUNTER — Ambulatory Visit (INDEPENDENT_AMBULATORY_CARE_PROVIDER_SITE_OTHER): Payer: Medicare Other | Admitting: Internal Medicine

## 2021-07-13 ENCOUNTER — Encounter: Payer: Self-pay | Admitting: Internal Medicine

## 2021-07-13 VITALS — BP 126/68 | HR 68 | Temp 97.9°F | Resp 16 | Ht 68.0 in | Wt 253.5 lb

## 2021-07-13 DIAGNOSIS — F419 Anxiety disorder, unspecified: Secondary | ICD-10-CM | POA: Diagnosis not present

## 2021-07-13 DIAGNOSIS — N200 Calculus of kidney: Secondary | ICD-10-CM | POA: Diagnosis not present

## 2021-07-13 DIAGNOSIS — M25511 Pain in right shoulder: Secondary | ICD-10-CM | POA: Diagnosis not present

## 2021-07-13 DIAGNOSIS — Z79899 Other long term (current) drug therapy: Secondary | ICD-10-CM

## 2021-07-13 NOTE — Patient Instructions (Addendum)
Go to the lab and provide a urine sample ? ?We are referring you to orthopedics, you can call them tomorrow to set up an appointment. ?  ? ?

## 2021-07-13 NOTE — Progress Notes (Signed)
? ?Subjective:  ? ? Patient ID: Jennifer [name], female    DOB: 11/11/1950, 71 y.o.   MRN: [id number] ? ?DOS:  07/13/2021 ?Type of visit - description: acute ? ?Saw GI 06/28/2021, they suspected possible nephrolithiasis, urinalysis showed no RBCs, UCX (-),  subsequently a CT abdomen and pelvis was done 07/08/2021, "possible punctate nonobstructing calculi R kidney". ?Patient is here and likes to discuss CT results. ? ?Also, 1 year history of right shoulder pain, more so over the last 4 weeks. ?Pain is worse at night or when she tries to uses her arm. ? ?Denies neck pain. ? ?Review of Systems ?See above  ? ?Past Medical History:  ?Diagnosis Date  ? Allergy   ? Anemia   ? Arthritis   ? Cataract   ? bil cataracts removed  ? Chronic chest pain   ? Chronic fatigue 02/03/2017  ? Clotting disorder ([place])   ? Pulmonary Emb[place] 2010 & 2015  ? Colon polyps   ? inflamed partially ulcerated hyperplastic polyp  ? Diabetes ([place]) 09/24/2013  ? no meds  ? Diverticulosis   ? Dysphagia esophageal phase - chronic after 3 fundoplications 93/26/7124  ? Esophageal stenosis   ? Stricture after fundoplication REDO  ? Fibromyalgia   ? GERD (gastroesophageal reflux disease)   ? Glaucoma suspect   ? Heart murmur   ? no problems   ? Hemorrhoids, internal, with bleeding 06/27/2017  ? History of hiatal hernia   ? Hypertension   ? IBS (irritable bowel syndrome)   ? Lactose intolerance   ? Mitral valve prolapse   ? OSA (obstructive sleep apnea)   ? on CPAP-[place] - Sleep study 2004  ? Pre-diabetes   ? Pulmonary embolus ([place])   ? 2010 and 12-2013  ? Sleep apnea   ? wears CPAP  ? Thyroid nodule   ?   dr. watching   ? Tinnitus   ? Subjective  ? Zoster 2014  ? R flank  ? ? ?Past Surgical History:  ?Procedure Laterality Date  ? ABDOMINAL HYSTERECTOMY    ? still has 1 ovary   ? APPENDECTOMY    ? BRAIN SURGERY  1995  ? Stem surgery, Roselie Awkward Chiari Malformation  ? CATARACT EXTRACTION Bilateral 2017  ? cataracts, Dr. Herbert Deaner  ? CHOLECYSTECTOMY    ? COLONOSCOPY    ?  ESOPHAGEAL MANOMETRY N/A 08/06/2019  ? Procedure: ESOPHAGEAL MANOMETRY (EM);  Surgeon: Gatha Mayer, MD;  Location: [facility];  Service: Endoscopy;  Laterality: N/A;  ? HEMORRHOID BANDING    ? HERNIA REPAIR    ? Hital Hernia  ? KNEE ARTHROSCOPY  05/16/2012  ? Procedure: ARTHROSCOPY KNEE;  Surgeon: Sharmon Revere, MD;  Location: [place];  Service: Orthopedics;  Laterality: Left;  ? KNEE ARTHROSCOPY Right 01/2018  ? NISSEN FUNDOPLICATION    ? X 3 lab, open x 2 last with mesh  ? POLYPECTOMY    ? REDUCTION MAMMAPLASTY Bilateral 2007  ? TOTAL KNEE ARTHROPLASTY Right 08/25/2019  ? Procedure: RIGHT TOTAL KNEE ARTHROPLASTY;  Surgeon: Gaynelle Arabian, MD;  Location: [facility];  Service: Orthopedics;  Laterality: Right;  52mn  ? UPPER GASTROINTESTINAL ENDOSCOPY    ? ? ?Current Outpatient Medications  ?Medication Instructions  ? acetaminophen (TYLENOL) 500 mg, Oral, Every 6 hours PRN  ? Aciphex 20 mg, Oral, 2 times daily  ? cycloSPORINE (RESTASIS) 0.05 % ophthalmic emulsion Restasis 0.05 % eye drops in a dropperette ? INSTILL 1 DROP INTO EACH EYE TWICE DAILY  ?  hyoscyamine (LEVSIN SL) 0.125 MG SL tablet DISSOLVE ONE TABLET IN MOUTH EVERY 4 HOURS AS NEEDED  ? Multiple Vitamins-Minerals (MULTIVITAMIN WITH MINERALS) tablet 1 tablet, Oral, Daily, Rainbow light   ? Prevagen 10 mg, Oral, Daily, prevagen   ? rosuvastatin (CRESTOR) 10 mg, Oral, Daily at bedtime  ? temazepam (RESTORIL) 30 MG capsule TAKE ONE CAPSULE BY MOUTH AT BEDTIME AS NEEDED FOR SLEEP  ? triamterene-hydrochlorothiazide (MAXZIDE-25) 37.5-25 MG tablet TAKE HALF TABLET BY MOUTH DAILY  ? XARELTO 2.5 MG TABS tablet TAKE TWO TABLETS BY MOUTH DAILY  ? ? ?   ?Objective:  ? Physical Exam ?BP 126/68 (BP Location: Left Arm, Patient Position: Sitting, Cuff Size: Normal)   Pulse 68   Temp 97.9 ?F (36.6 ?C) (Oral)   Resp 16   Ht '5\' 8"'$  (1.727 m)   Wt 253 lb 8 oz (115 kg)   SpO2 97%   BMI 38.54 kg/m?  ?General:   ?Well developed, NAD, BMI noted. ?HEENT:  ?Normocephalic .  Face symmetric, atraumatic ?MSK: ?Shoulders symmetric on exam, range of motion minimally decreased on the R side.  Motor strength symmetric. ?Skin: Not pale. Not jaundice ?Neurologic:  ?alert & oriented X3.  ?Speech normal, gait appropriate for age and unassisted ?Psych--  ?Cognition and judgment appear intact.  ?Cooperative with normal attention span and concentration.  ?Behavior appropriate. ?No anxious or depressed appearing.  ? ?   ?Assessment   ? ? Assessment   ?DM, + neuropathy  ?HTN ?Anxiety, insomnia: On restoril prn  ?Thyroid nodule: Bx (non neoplastic goiter) 2014,  [place] 05-2016, 2022 (stable) ?Pulmonary: ?--OSA, sleep study 2004, on a CPAP ?--PEs:2010 and 12-2013 - saw hematology 12-2014, rec anticoag x 2 years (until 12-2015), then continue with a low-dose xarelto   life   ?-- multiple pulmonary nodule, last CT 11-2013: No further imaging suggested ?--RAD, inhalers rarely ?Fibromyalgia ?CV: ? MVP,  carotid US 1-39%s ?GI: ?--GERD,chronic dysphagia s/p  fundoplication ?3, esophageal stenosis- ?- April 2021: Barium study, esophageal manometry (see report) ?-- IBS ?--Hemorrhoids: S/p banding ~/2019 ?-- chronic right flank,RUQ pain : since GB surgery 2008 Etiology not clear but likely multifactorial --> Adhesions from the surgery, neuropathy (DM), postherpetic neuralgia, scarring from PE, etc.  MRI T spine done 06-2014: DJD  ?Zoster --  2014 right flank ?  ?PLAN ?Urolithiasis: ?See HPI, had a CT Rx by GI, days suspecting urolithiasis, she has a single small stone at the R kidney, that is likely an incidental finding, if she ever develops right-sided colic type of pain or gross hematuria she will seek medical attention. ?R shoulder pain: Suspect rotator cuff injury, referred back to orthopedics. ?Continue Tylenol, avoid NSAIDs. ? ?  ? ?This visit occurred during the SARS-CoV-2 public health emergency.  Safety protocols were in place, including screening questions prior to the visit, additional usage of staff PPE, and  extensive cleaning of exam room while observing appropriate contact time as indicated for disinfecting solutions.  ? ?

## 2021-07-14 DIAGNOSIS — N200 Calculus of kidney: Secondary | ICD-10-CM | POA: Insufficient documentation

## 2021-07-14 NOTE — Assessment & Plan Note (Signed)
Urolithiasis: ?See HPI, had a CT Rx by GI, days suspecting urolithiasis, she has a single small stone at the R kidney, that is likely an incidental finding, if she ever develops right-sided colic type of pain or gross hematuria she will seek medical attention. ?R shoulder pain: Suspect rotator cuff injury, referred back to orthopedics. ?Continue Tylenol, avoid NSAIDs. ? ?  ?

## 2021-07-15 LAB — DRUG MONITORING PANEL 375977 , URINE
Alcohol Metabolites: NEGATIVE ng/mL (ref <500)
Alphahydroxyalprazolam: NEGATIVE ng/mL (ref <25)
Alphahydroxymidazolam: NEGATIVE ng/mL (ref <50)
Alphahydroxytriazolam: NEGATIVE ng/mL (ref <50)
Aminoclonazepam: NEGATIVE ng/mL (ref <25)
Amphetamines: NEGATIVE ng/mL (ref <500)
Barbiturates: NEGATIVE ng/mL (ref <300)
Benzodiazepines: POSITIVE ng/mL — AB (ref <100)
Cocaine Metabolite: NEGATIVE ng/mL (ref <150)
Desmethyltramadol: NEGATIVE ng/mL (ref <100)
Hydroxyethylflurazepam: NEGATIVE ng/mL (ref <50)
Lorazepam: NEGATIVE ng/mL (ref <50)
Marijuana Metabolite: NEGATIVE ng/mL (ref <20)
Nordiazepam: NEGATIVE ng/mL (ref <50)
Opiates: NEGATIVE ng/mL (ref <100)
Oxazepam: 1385 ng/mL — ABNORMAL HIGH (ref <50)
Oxycodone: NEGATIVE ng/mL (ref <100)
Temazepam: >6250 ng/mL — ABNORMAL HIGH (ref <50)
Tramadol: NEGATIVE ng/mL (ref <100)

## 2021-07-15 LAB — DM TEMPLATE

## 2021-07-19 ENCOUNTER — Telehealth: Payer: Self-pay | Admitting: *Deleted

## 2021-07-19 NOTE — Telephone Encounter (Signed)
Returned patient's phone call regarding upcoming dental work this Thursday, 07/21/21. Patient is having one wisdom tooth pulled. Per Lottie Dawson, NP, STOP taking Xarelto today,tomorrow and Thursday. Resume Xarelto on Friday. She verbalized understanding.  ?

## 2021-07-27 ENCOUNTER — Other Ambulatory Visit: Payer: Self-pay | Admitting: Hematology & Oncology

## 2021-07-28 ENCOUNTER — Ambulatory Visit (INDEPENDENT_AMBULATORY_CARE_PROVIDER_SITE_OTHER): Payer: Medicare Other | Admitting: Bariatrics

## 2021-07-28 ENCOUNTER — Encounter (INDEPENDENT_AMBULATORY_CARE_PROVIDER_SITE_OTHER): Payer: Self-pay | Admitting: Bariatrics

## 2021-07-28 VITALS — BP 149/72 | HR 70 | Temp 98.4°F | Ht 68.0 in | Wt 249.0 lb

## 2021-07-28 DIAGNOSIS — Z6838 Body mass index (BMI) 38.0-38.9, adult: Secondary | ICD-10-CM

## 2021-07-28 DIAGNOSIS — R5382 Chronic fatigue, unspecified: Secondary | ICD-10-CM

## 2021-07-28 DIAGNOSIS — K5909 Other constipation: Secondary | ICD-10-CM

## 2021-07-28 DIAGNOSIS — E669 Obesity, unspecified: Secondary | ICD-10-CM | POA: Diagnosis not present

## 2021-07-28 DIAGNOSIS — I10 Essential (primary) hypertension: Secondary | ICD-10-CM | POA: Diagnosis not present

## 2021-07-29 NOTE — Progress Notes (Signed)
? ? ? ?Chief Complaint:  ? ?OBESITY ?Jennifer [name] is here to discuss her progress with her obesity treatment plan along with follow-up of her obesity related diagnoses. Jennifer [name] is on the Category 2 Plan and states she is following her eating plan approximately 95% of the time. Jennifer [name] states she is doing 0 minutes 0 times per week. ? ?Today's visit was #: 4 ?Starting weight: 255 lbs ?Starting date: 05/19/2021 ?Today's weight: 249 lbs ?Today's date: 07/28/2021 ?Total lbs lost to date: 6 lbs ?Total lbs lost since last in-office visit: 3 lbs ? ?Interim History: Jennifer [name] is down an additional 3 lbs.  ? ?Subjective:  ? ?1. Essential hypertension ?Jennifer [name] is currently taking Maxzide-25. Her blood pressure is slightly high today 149/72. ? ?2. Chronic fatigue ?Jennifer [name] states her energy is still low.  ? ?3. Other constipation ?Jennifer [name] notes increased protein.  ? ?Assessment/Plan:  ? ?1. Essential hypertension ?Jennifer [name] will continue taking Maxzide-25. She is working on healthy weight loss and exercise to improve blood pressure control. We will watch for signs of hypotension as she continues her lifestyle modifications. ? ?2. Chronic fatigue ?Jennifer [name] will begin walking. She does feel that her weight is causing her energy to be lower than it should be. Fatigue may be related to obesity, depression or many other causes. Labs will be ordered, and in the meanwhile, Jennifer [name] will focus on self care including making healthy food choices, increasing physical activity and focusing on stress reduction.   ? ?3. Other constipation ?Jennifer [name] will increase her water intake. She can add flax seed and or bran flakes. She will begin Miralax every other day. She will use milk of magnesium as needed. Magnesium 300 mg over the counter. Jennifer [name] was informed that a decrease in bowel movement frequency is normal while losing weight, but stools should not be hard or painful. Orders and follow up as documented in patient record.  ? ?Counseling ?Getting to Good Bowel Health: Your goal is to have  one soft bowel movement each day. Drink at least 8 glasses of water each day. Eat plenty of fiber (goal is over 25 grams each day). It is best to get most of your fiber from dietary sources which includes leafy green vegetables, fresh fruit, and whole grains. You may need to add fiber with the help of OTC fiber supplements. These include Metamucil, Citrucel, and Flaxseed. If you are still having trouble, try adding Miralax or Magnesium Citrate. If all of these changes do not work, Cabin crew.  ? ?4. Obesity, current BMI 38.4 ?Jennifer [name] is currently in the action stage of change. As such, her goal is to continue with weight loss efforts. She has agreed to the Category 2 Plan.  ? ?Jennifer [name] will continue meal planning and she will continue intentional eating. She will keep her water intake high.  ? ?Exercise goals:  Jennifer [name] has started walking.  ? ?Behavioral modification strategies: increasing lean protein intake, decreasing simple carbohydrates, increasing vegetables, increasing water intake, decreasing eating out, no skipping meals, meal planning and cooking strategies, keeping healthy foods in the home, and planning for success. ? ?Jennifer [name] has agreed to follow-up with our clinic in 2 weeks. She was informed of the importance of frequent follow-up visits to maximize her success with intensive lifestyle modifications for her multiple health conditions.  ? ?Objective:  ? ?Blood pressure (!) 149/72, pulse 70, temperature 98.4 ?F (36.9 ?C), height '5\' 8"'$  (1.727 m), weight 249 lb (112.9 kg), SpO2 95 %. ?Body mass index is 37.86 kg/m?Marland Kitchen ? ?  General: Cooperative, alert, well developed, in no acute distress. ?HEENT: Conjunctivae and lids unremarkable. ?Cardiovascular: Regular rhythm.  ?Lungs: Normal work of breathing. ?Neurologic: No focal deficits.  ? ?Lab Results  ?Component Value Date  ? CREATININE 0.89 06/28/2021  ? BUN 20 06/28/2021  ? NA 140 06/28/2021  ? K 4.0 06/28/2021  ? CL 102 06/28/2021  ? CO2 31 06/28/2021  ? ?Lab  Results  ?Component Value Date  ? ALT 23 06/28/2021  ? AST 24 06/28/2021  ? ALKPHOS 57 06/28/2021  ? BILITOT 0.3 06/28/2021  ? ?Lab Results  ?Component Value Date  ? HGBA1C 6.3 04/26/2021  ? HGBA1C 6.0 (H) 12/29/2020  ? HGBA1C 5.9 (H) 08/27/2020  ? HGBA1C 5.6 04/29/2020  ? HGBA1C 6.0 11/19/2019  ? ?Lab Results  ?Component Value Date  ? INSULIN 12.6 05/19/2021  ? ?Lab Results  ?Component Value Date  ? TSH 2.85 04/26/2021  ? ?Lab Results  ?Component Value Date  ? CHOL 185 04/26/2021  ? HDL 68.10 04/26/2021  ? [place] 98 04/26/2021  ? TRIG 91.0 04/26/2021  ? CHOLHDL 3 04/26/2021  ? ?Lab Results  ?Component Value Date  ? VD25OH 42.2 05/19/2021  ? ?Lab Results  ?Component Value Date  ? WBC 4.4 06/28/2021  ? HGB 12.3 06/28/2021  ? HCT 36.5 06/28/2021  ? MCV 86.0 06/28/2021  ? PLT 243.0 06/28/2021  ? ?Lab Results  ?Component Value Date  ? IRON 59 02/16/2021  ? TIBC 312.2 02/16/2021  ? FERRITIN 73.1 02/16/2021  ? ?Attestation Statements:  ? ?Reviewed by clinician on day of visit: allergies, medications, problem list, medical history, surgical history, family history, social history, and previous encounter notes. ? ?I, Lizbeth Bark, RMA, am acting as transcriptionist for [employer], DO. ? ?I have reviewed the above documentation for accuracy and completeness, and I agree with the above. Jearld Lesch, DO ? ?

## 2021-08-02 ENCOUNTER — Encounter (INDEPENDENT_AMBULATORY_CARE_PROVIDER_SITE_OTHER): Payer: Self-pay | Admitting: Bariatrics

## 2021-08-09 DIAGNOSIS — M25511 Pain in right shoulder: Secondary | ICD-10-CM | POA: Diagnosis not present

## 2021-08-09 DIAGNOSIS — M25512 Pain in left shoulder: Secondary | ICD-10-CM | POA: Diagnosis not present

## 2021-08-18 ENCOUNTER — Ambulatory Visit (INDEPENDENT_AMBULATORY_CARE_PROVIDER_SITE_OTHER): Payer: Medicare Other | Admitting: Bariatrics

## 2021-08-24 ENCOUNTER — Encounter: Payer: Self-pay | Admitting: Family

## 2021-08-24 ENCOUNTER — Telehealth: Payer: Self-pay | Admitting: Internal Medicine

## 2021-08-24 NOTE — Telephone Encounter (Signed)
Left message for patient to call back and schedule Medicare Annual Wellness Visit (AWV) either virtually or phone. ? ? ?Last AWV ;01/06/19 ?please schedule at anytime with health coach ? ?I left my direct number [phone] ?

## 2021-08-25 ENCOUNTER — Other Ambulatory Visit: Payer: Self-pay | Admitting: Internal Medicine

## 2021-08-25 ENCOUNTER — Other Ambulatory Visit: Payer: Self-pay | Admitting: Hematology & Oncology

## 2021-08-25 ENCOUNTER — Ambulatory Visit (INDEPENDENT_AMBULATORY_CARE_PROVIDER_SITE_OTHER): Payer: Medicare Other

## 2021-08-25 VITALS — Ht 68.0 in | Wt 249.0 lb

## 2021-08-25 DIAGNOSIS — Z0000 Encounter for general adult medical examination without abnormal findings: Secondary | ICD-10-CM

## 2021-08-25 NOTE — Patient Instructions (Signed)
?Jennifer [name] , ?Thank you for taking time to come for your Medicare Wellness Visit. I appreciate your ongoing commitment to your health goals. Please review the following plan we discussed and let me know if I can assist you in the future.  ? ?These are the goals we discussed: ? Goals   ? ?  Increase physical activity   ? ?  ?  ?This is a list of the screening recommended for you and due dates:  ?Health Maintenance  ?Topic Date Due  ? Pneumonia Vaccine (3) 12/06/2019  ? Complete foot exam   03/04/2021  ? Hemoglobin A1C  10/24/2021  ? Flu Shot  11/22/2021  ? Mammogram  03/04/2022  ? Urine Protein Check  04/26/2022  ? Eye exam for diabetics  06/28/2022  ? Tetanus Vaccine  08/24/2022  ? Colon Cancer Screening  01/18/2028  ? DEXA scan (bone density measurement)  Completed  ? COVID-19 Vaccine  Completed  ? Hepatitis C Screening: USPSTF Recommendation to screen - Ages 52-79 yo.  Completed  ? Zoster (Shingles) Vaccine  Completed  ? HPV Vaccine  Aged Out  ? ? ?Advanced directives: No Patient deferred ? ?Conditions/risks identified: None ? ?Next appointment: Follow up in one year for your annual wellness visit  ? ? ? ?Preventive Care 47 Years and Older, Female ?Preventive care refers to lifestyle choices and visits with your health care provider that can promote health and wellness. ?What does preventive care include? ?A yearly physical exam. This is also called an annual well check. ?Dental exams once or twice a year. ?Routine eye exams. Ask your health care provider how often you should have your eyes checked. ?Personal lifestyle choices, including: ?Daily care of your teeth and gums. ?Regular physical activity. ?Eating a healthy diet. ?Avoiding tobacco and drug use. ?Limiting alcohol use. ?Practicing safe sex. ?Taking low-dose aspirin every day. ?Taking vitamin and mineral supplements as recommended by your health care provider. ?What happens during an annual well check? ?The services and screenings done by your health  care provider during your annual well check will depend on your age, overall health, lifestyle risk factors, and family history of disease. ?Counseling  ?Your health care provider may ask you questions about your: ?Alcohol use. ?Tobacco use. ?Drug use. ?Emotional well-being. ?Home and relationship well-being. ?Sexual activity. ?Eating habits. ?History of falls. ?Memory and ability to understand (cognition). ?Work and work Statistician. ?Reproductive health. ?Screening  ?You may have the following tests or measurements: ?Height, weight, and BMI. ?Blood pressure. ?Lipid and cholesterol levels. These may be checked every 5 years, or more frequently if you are over 6 years old. ?Skin check. ?Lung cancer screening. You may have this screening every year starting at age 35 if you have a 30-pack-year history of smoking and currently smoke or have quit within the past 15 years. ?Fecal occult blood test (FOBT) of the stool. You may have this test every year starting at age 54. ?Flexible sigmoidoscopy or colonoscopy. You may have a sigmoidoscopy every 5 years or a colonoscopy every 10 years starting at age 26. ?Hepatitis C blood test. ?Hepatitis B blood test. ?Sexually transmitted disease (STD) testing. ?Diabetes screening. This is done by checking your blood sugar (glucose) after you have not eaten for a while (fasting). You may have this done every 1-3 years. ?Bone density scan. This is done to screen for osteoporosis. You may have this done starting at age 82. ?Mammogram. This may be done every 1-2 years. Talk to your health care provider  about how often you should have regular mammograms. ?Talk with your health care provider about your test results, treatment options, and if necessary, the need for more tests. ?Vaccines  ?Your health care provider may recommend certain vaccines, such as: ?Influenza vaccine. This is recommended every year. ?Tetanus, diphtheria, and acellular pertussis (Tdap, Td) vaccine. You may need a Td  booster every 10 years. ?Zoster vaccine. You may need this after age 25. ?Pneumococcal 13-valent conjugate (PCV13) vaccine. One dose is recommended after age 75. ?Pneumococcal polysaccharide (PPSV23) vaccine. One dose is recommended after age 83. ?Talk to your health care provider about which screenings and vaccines you need and how often you need them. ?This information is not intended to replace advice given to you by your health care provider. Make sure you discuss any questions you have with your health care provider. ?Document Released: 05/07/2015 Document Revised: 12/29/2015 Document Reviewed: 02/09/2015 ?Elsevier Interactive Patient Education ? 2017 [place]. ? ?Fall Prevention in the Home ?Falls can cause injuries. They can happen to people of all ages. There are many things you can do to make your home safe and to help prevent falls. ?What can I do on the outside of my home? ?Regularly fix the edges of walkways and driveways and fix any cracks. ?Remove anything that might make you trip as you walk through a door, such as a raised step or threshold. ?Trim any bushes or trees on the path to your home. ?Use bright outdoor lighting. ?Clear any walking paths of anything that might make someone trip, such as rocks or tools. ?Regularly check to see if handrails are loose or broken. Make sure that both sides of any steps have handrails. ?Any raised decks and porches should have guardrails on the edges. ?Have any leaves, snow, or ice cleared regularly. ?Use sand or salt on walking paths during winter. ?Clean up any spills in your garage right away. This includes oil or grease spills. ?What can I do in the bathroom? ?Use night lights. ?Install grab bars by the toilet and in the tub and shower. Do not use towel bars as grab bars. ?Use non-skid mats or decals in the tub or shower. ?If you need to sit down in the shower, use a plastic, non-slip stool. ?Keep the floor dry. Clean up any water that spills on the floor  as soon as it happens. ?Remove soap buildup in the tub or shower regularly. ?Attach bath mats securely with double-sided non-slip rug tape. ?Do not have throw rugs and other things on the floor that can make you trip. ?What can I do in the bedroom? ?Use night lights. ?Make sure that you have a light by your bed that is easy to reach. ?Do not use any sheets or blankets that are too big for your bed. They should not hang down onto the floor. ?Have a firm chair that has side arms. You can use this for support while you get dressed. ?Do not have throw rugs and other things on the floor that can make you trip. ?What can I do in the kitchen? ?Clean up any spills right away. ?Avoid walking on wet floors. ?Keep items that you use a lot in easy-to-reach places. ?If you need to reach something above you, use a strong step stool that has a grab bar. ?Keep electrical cords out of the way. ?Do not use floor polish or wax that makes floors slippery. If you must use wax, use non-skid floor wax. ?Do not have throw rugs and  other things on the floor that can make you trip. ?What can I do with my stairs? ?Do not leave any items on the stairs. ?Make sure that there are handrails on both sides of the stairs and use them. Fix handrails that are broken or loose. Make sure that handrails are as long as the stairways. ?Check any carpeting to make sure that it is firmly attached to the stairs. Fix any carpet that is loose or worn. ?Avoid having throw rugs at the top or bottom of the stairs. If you do have throw rugs, attach them to the floor with carpet tape. ?Make sure that you have a light switch at the top of the stairs and the bottom of the stairs. If you do not have them, ask someone to add them for you. ?What else can I do to help prevent falls? ?Wear shoes that: ?Do not have high heels. ?Have rubber bottoms. ?Are comfortable and fit you well. ?Are closed at the toe. Do not wear sandals. ?If you use a stepladder: ?Make sure that it is  fully opened. Do not climb a closed stepladder. ?Make sure that both sides of the stepladder are locked into place. ?Ask someone to hold it for you, if possible. ?Clearly mark and make sure that you can

## 2021-08-25 NOTE — Progress Notes (Addendum)
? ?Subjective:  ? Jennifer [name] is a 70 y.o. female who presents for Medicare Annual (Subsequent) preventive examination. ? ?Review of Systems    ?Virtual Visit via Telephone Note ? ?I connected with  Jennifer [name] on 08/25/21 at  3:00 PM EDT by telephone and verified that I am speaking with the correct person using two identifiers. ? ?Location: ?Patient: Home ?Provider: Office ?Persons participating in the virtual visit: patient/Nurse Health Advisor ?  ?I discussed the limitations, risks, security and privacy concerns of performing an evaluation and management service by telephone and the availability of in person appointments. The patient expressed understanding and agreed to proceed. ? ?Interactive audio and video telecommunications were attempted between this nurse and patient, however failed, due to patient having technical difficulties OR patient did not have access to video capability.  We continued and completed visit with audio only. ? ?Some vital signs may be absent or patient reported.  ? ?Criselda Peaches, LPN  ?Cardiac Risk Factors include: advanced age (>59mn, >>80women);diabetes mellitus;hypertension ? ?   ?Objective:  ?  ?Today's Vitals  ? 08/25/21 1503  ?Weight: 249 lb (112.9 kg)  ?Height: '5\' 8"'$  (1.727 m)  ? ?Body mass index is 37.86 kg/m?. ? ? ?  08/25/2021  ?  3:16 PM 06/06/2021  ?  3:40 PM 12/29/2020  ?  2:10 PM 08/27/2020  ?  1:25 PM 04/29/2020  ?  1:45 PM 12/25/2019  ?  2:28 PM 08/25/2019  ?  6:00 PM  ?Advanced Directives  ?Does Patient Have a Medical Advance Directive? No Yes No No No No No  ?Type of Advance Directive  Living will       ?Does patient want to make changes to medical advance directive?  No - Patient declined       ?Would patient like information on creating a medical advance directive? No - Patient declined  No - Patient declined No - Patient declined No - Patient declined No - Patient declined No - Patient declined  ? ? ?Current Medications (verified) ?Outpatient Encounter Medications as of  08/25/2021  ?Medication Sig  ? acetaminophen (TYLENOL) 500 MG tablet Take 1 tablet (500 mg total) by mouth every 6 (six) hours as needed.  ? ACIPHEX 20 MG tablet Take 1 tablet (20 mg total) by mouth 2 (two) times daily.  ? Apoaequorin (PREVAGEN) 10 MG CAPS Take 10 mg by mouth daily. prevagen  ? cycloSPORINE (RESTASIS) 0.05 % ophthalmic emulsion Restasis 0.05 % eye drops in a dropperette ? INSTILL 1 DROP INTO EACH EYE TWICE DAILY  ? hyoscyamine (LEVSIN SL) 0.125 MG SL tablet DISSOLVE ONE TABLET IN MOUTH EVERY 4 HOURS AS NEEDED  ? Multiple Vitamins-Minerals (MULTIVITAMIN WITH MINERALS) tablet Take 1 tablet by mouth daily. Rainbow light  ? rosuvastatin (CRESTOR) 10 MG tablet Take 1 tablet (10 mg total) by mouth at bedtime.  ? temazepam (RESTORIL) 30 MG capsule TAKE ONE CAPSULE BY MOUTH AT BEDTIME AS NEEDED FOR SLEEP  ? triamterene-hydrochlorothiazide (MAXZIDE-25) 37.5-25 MG tablet TAKE HALF TABLET BY MOUTH DAILY  ? XARELTO 2.5 MG TABS tablet TAKE TWO TABLETS BY MOUTH DAILY  ? ?No facility-administered encounter medications on file as of 08/25/2021.  ? ? ?Allergies (verified) ?Morphine and related, Sulfonamide derivatives, and Promethazine hcl  ? ?History: ?Past Medical History:  ?Diagnosis Date  ? Allergy   ? Anemia   ? Arthritis   ? Cataract   ? bil cataracts removed  ? Chronic chest pain   ? Chronic fatigue  02/03/2017  ? Clotting disorder ([place])   ? Pulmonary Embolis 2010 & 2015  ? Colon polyps   ? inflamed partially ulcerated hyperplastic polyp  ? Diabetes ([place]) 09/24/2013  ? no meds  ? Diverticulosis   ? Dysphagia esophageal phase - chronic after 3 fundoplications 67/89/3810  ? Esophageal stenosis   ? Stricture after fundoplication REDO  ? Fibromyalgia   ? GERD (gastroesophageal reflux disease)   ? Glaucoma suspect   ? Heart murmur   ? no problems   ? Hemorrhoids, internal, with bleeding 06/27/2017  ? History of hiatal hernia   ? Hypertension   ? IBS (irritable bowel syndrome)   ? Lactose intolerance   ? Mitral valve  prolapse   ? OSA (obstructive sleep apnea)   ? on CPAP-[place] - Sleep study 2004  ? Pre-diabetes   ? Pulmonary embolus ([place])   ? 2010 and 12-2013  ? Sleep apnea   ? wears CPAP  ? Thyroid nodule   ?   dr. watching   ? Tinnitus   ? Subjective  ? Zoster 2014  ? R flank  ? ?Past Surgical History:  ?Procedure Laterality Date  ? ABDOMINAL HYSTERECTOMY    ? still has 1 ovary   ? APPENDECTOMY    ? BRAIN SURGERY  1995  ? Stem surgery, Roselie Awkward Chiari Malformation  ? CATARACT EXTRACTION Bilateral 2017  ? cataracts, Dr. Herbert Deaner  ? CHOLECYSTECTOMY    ? COLONOSCOPY    ? ESOPHAGEAL MANOMETRY N/A 08/06/2019  ? Procedure: ESOPHAGEAL MANOMETRY (EM);  Surgeon: Gatha Mayer, MD;  Location: [facility];  Service: Endoscopy;  Laterality: N/A;  ? HEMORRHOID BANDING    ? HERNIA REPAIR    ? Hital Hernia  ? KNEE ARTHROSCOPY  05/16/2012  ? Procedure: ARTHROSCOPY KNEE;  Surgeon: Sharmon Revere, MD;  Location: [facility];  Service: Orthopedics;  Laterality: Left;  ? KNEE ARTHROSCOPY Right 01/2018  ? NISSEN FUNDOPLICATION    ? X 3 lab, open x 2 last with mesh  ? POLYPECTOMY    ? REDUCTION MAMMAPLASTY Bilateral 2007  ? TOTAL KNEE ARTHROPLASTY Right 08/25/2019  ? Procedure: RIGHT TOTAL KNEE ARTHROPLASTY;  Surgeon: Gaynelle Arabian, MD;  Location: [facility];  Service: Orthopedics;  Laterality: Right;  28mn  ? UPPER GASTROINTESTINAL ENDOSCOPY    ? ?Family History  ?Problem Relation Age of Onset  ? Diabetes Mother   ? Rheum arthritis Mother   ? Hypertension Mother   ? Hypertension Father   ? Alcoholism Father   ? Rheum arthritis Sister   ? Hypertension Sister   ? Rheum arthritis Sister   ? Rheum arthritis Sister   ? CAD Sister   ?     MI age 69 ? Fibromyalgia Sister   ? Colon cancer Maternal Grandmother   ?     dx with colon ca laster in life - lived to be 149 ? Crohn's disease Maternal Aunt   ? Stomach cancer Maternal Uncle   ? Lung cancer Maternal Uncle   ? Breast cancer Neg Hx   ? Esophageal cancer Neg Hx   ? Rectal cancer Neg Hx   ? Prostate cancer Neg  Hx   ? Pancreatic cancer Neg Hx   ? Colon polyps Neg Hx   ? ?Social History  ? ?Socioeconomic History  ? Marital status: Married  ?  Spouse name: Not on file  ? Number of children: 1  ? Years of education: Not on file  ? Highest education  level: Not on file  ?Occupational History  ? Occupation: retired form the [employer] 04-2015  ?  Employer: [employer]  ?Tobacco Use  ? Smoking status: Never  ? Smokeless tobacco: Never  ? Tobacco comments:  ?  never used tobacco  ?Vaping Use  ? Vaping Use: Never used  ?Substance and Sexual Activity  ? Alcohol use: No  ?  Alcohol/week: 0.0 standard drinks  ? Drug use: No  ? Sexual activity: Not Currently  ?  Birth control/protection: None  ?Other Topics Concern  ? Not on file  ?Social History Narrative  ? [employer] Graduate - Technical school  ? Married 08-05-68 for 2 years, divorced; re-married 08/05/1984 for 2.5 years, divorced; re-married August 05, 1988 for 1 year, divorced; re-married '00  ? 1 son - 2070/08/06  ? Sister - Died @ 52 [place]  ?    ? ?Social Determinants of Health  ? ?Financial Resource Strain: Low Risk   ? Difficulty of Paying Living Expenses: Not hard at all  ?Food Insecurity: No Food Insecurity  ? Worried About Charity fundraiser in the Last Year: Never true  ? Ran Out of Food in the Last Year: Never true  ?Transportation Needs: Unknown  ? Lack of Transportation (Medical): Not on file  ? Lack of Transportation (Non-Medical): No  ?Physical Activity: Inactive  ? Days of Exercise per Week: 0 days  ? Minutes of Exercise per Session: 0 min  ?Stress: No Stress Concern Present  ? Feeling of Stress : Not at all  ?Social Connections: Socially Integrated  ? Frequency of Communication with Friends and Family: More than three times a week  ? Frequency of Social Gatherings with Friends and Family: More than three times a week  ? Attends Religious Services: More than 4 times per year  ? Active Member of Clubs or Organizations: Yes  ? Attends Archivist Meetings: More than 4 times per year  ?  Marital Status: Married  ? ? ?Clinical Intake: ?Nutrition Risk Assessment: ? ?Has the patient had any N/V/D within the last 2 months?  No  ?Does the patient have any non-healing wounds?  No  ?Has the patie

## 2021-08-26 ENCOUNTER — Encounter (HOSPITAL_COMMUNITY): Payer: Self-pay | Admitting: Surgery

## 2021-08-26 NOTE — Progress Notes (Signed)
PA for Xarelto 2.5 mg tablets submitted to [facility] on 08/26/21.  PA resolved on 08/26/21 and no additional PA is needed. ?

## 2021-08-29 ENCOUNTER — Ambulatory Visit (INDEPENDENT_AMBULATORY_CARE_PROVIDER_SITE_OTHER): Payer: Medicare Other | Admitting: Nurse Practitioner

## 2021-08-29 ENCOUNTER — Encounter (INDEPENDENT_AMBULATORY_CARE_PROVIDER_SITE_OTHER): Payer: Self-pay | Admitting: Nurse Practitioner

## 2021-08-29 VITALS — BP 122/77 | HR 79 | Temp 98.2°F | Ht 68.0 in | Wt 243.0 lb

## 2021-08-29 DIAGNOSIS — E785 Hyperlipidemia, unspecified: Secondary | ICD-10-CM | POA: Diagnosis not present

## 2021-08-29 DIAGNOSIS — I10 Essential (primary) hypertension: Secondary | ICD-10-CM

## 2021-08-29 DIAGNOSIS — E669 Obesity, unspecified: Secondary | ICD-10-CM

## 2021-08-29 DIAGNOSIS — Z6836 Body mass index (BMI) 36.0-36.9, adult: Secondary | ICD-10-CM

## 2021-08-31 NOTE — Progress Notes (Signed)
? ? ? ?Chief Complaint:  ? ?OBESITY ?Jennifer [name] is here to discuss her progress with her obesity treatment plan along with follow-up of her obesity related diagnoses. Jennifer [name] is on the Category 2 Plan and states she is following her eating plan approximately 85% of the time. Jennifer [name] states she is doing cubit for 30 minutes 2 times per week. ? ?Today's visit was #: 5 ?Starting weight: 255 lbs ?Starting date: 05/19/2021 ?Today's weight: 243 lbs ?Today's date: 08/29/2021 ?Total lbs lost to date: 12 lbs ?Total lbs lost since last in-office visit: 6 lbs ? ?Interim History: Jennifer [name] has done well with weight loss since her last visit. She is sometimes substituting a protein shake for breakfast. She denies hunger, has some cravings. [facility] in addition to coming here. She is going on a cruise in a couple of weeks. She is drinking water daily.  ? ?Subjective:  ? ?1. Essential hypertension ?Jennifer [name]'s blood pressure looks better today. She is taking Maxide 37.5-25 mg. She denies side effects. She denies chest pain, shortness of breath, and palpitations.  ? ?2. Hyperlipidemia, unspecified hyperlipidemia type ?Jennifer [name] is currently taking Crestor 10 mg. She denies side effects.  ? ?Assessment/Plan:  ? ?1. Essential hypertension ?Jennifer [name] will continue to follow up with her primary care physician. She will continue medications as directed. She is working on healthy weight loss and exercise to improve blood pressure control. We will watch for signs of hypotension as she continues her lifestyle modifications. ? ?2. Hyperlipidemia, unspecified hyperlipidemia type ?Cardiovascular risk and specific lipid/LDL goals reviewed.  Jennifer [name] will continue to follow up with her primary care physician. She will continue her medications as directed. We discussed several lifestyle modifications today and Jennifer [name] will continue to work on diet, exercise and weight loss efforts. Orders and follow up as documented in patient record.   ? ?Counseling ?Intensive lifestyle modifications are the first line treatment for this issue. ?Dietary changes: Increase soluble fiber. Decrease simple carbohydrates. ?Exercise changes: Moderate to vigorous-intensity aerobic activity 150 minutes per week if tolerated. ?Lipid-lowering medications: see documented in medical record. ? ?3. Obesity, current BMI 36.9 ?Jennifer [name] is currently in the action stage of change. As such, her goal is to continue with weight loss efforts. She has agreed to the Category 2 Plan.  ? ?Exercise goals:  As is. ? ?Behavioral modification strategies: increasing lean protein intake, increasing water intake, no skipping meals, and meal planning and cooking strategies. ? ?Jennifer [name] has agreed to follow-up with our clinic in 3 weeks. She was informed of the importance of frequent follow-up visits to maximize her success with intensive lifestyle modifications for her multiple health conditions.  ? ?Objective:  ? ?Blood pressure 122/77, pulse 79, temperature 98.2 ?F (36.8 ?C), height '5\' 8"'$  (1.727 m), weight 243 lb (110.2 kg), SpO2 98 %. ?Body mass index is 36.95 kg/m?. ? ?General: Cooperative, alert, well developed, in no acute distress. ?HEENT: Conjunctivae and lids unremarkable. ?Cardiovascular: Regular rhythm.  ?Lungs: Normal work of breathing. ?Neurologic: No focal deficits.  ? ?Lab Results  ?Component Value Date  ? CREATININE 0.89 06/28/2021  ? BUN 20 06/28/2021  ? NA 140 06/28/2021  ? K 4.0 06/28/2021  ? CL 102 06/28/2021  ? CO2 31 06/28/2021  ? ?Lab Results  ?Component Value Date  ? ALT 23 06/28/2021  ? AST 24 06/28/2021  ? ALKPHOS 57 06/28/2021  ? BILITOT 0.3 06/28/2021  ? ?Lab Results  ?Component Value Date  ? HGBA1C 6.3 04/26/2021  ?  HGBA1C 6.0 (H) 12/29/2020  ? HGBA1C 5.9 (H) 08/27/2020  ? HGBA1C 5.6 04/29/2020  ? HGBA1C 6.0 11/19/2019  ? ?Lab Results  ?Component Value Date  ? INSULIN 12.6 05/19/2021  ? ?Lab Results  ?Component Value Date  ? TSH 2.85 04/26/2021  ? ?Lab Results  ?Component  Value Date  ? CHOL 185 04/26/2021  ? HDL 68.10 04/26/2021  ? [place] 98 04/26/2021  ? TRIG 91.0 04/26/2021  ? CHOLHDL 3 04/26/2021  ? ?Lab Results  ?Component Value Date  ? VD25OH 42.2 05/19/2021  ? ?Lab Results  ?Component Value Date  ? WBC 4.4 06/28/2021  ? HGB 12.3 06/28/2021  ? HCT 36.5 06/28/2021  ? MCV 86.0 06/28/2021  ? PLT 243.0 06/28/2021  ? ?Lab Results  ?Component Value Date  ? IRON 59 02/16/2021  ? TIBC 312.2 02/16/2021  ? FERRITIN 73.1 02/16/2021  ? ?Attestation Statements:  ? ?Reviewed by clinician on day of visit: allergies, medications, problem list, medical history, surgical history, family history, social history, and previous encounter notes. ? ?Time spent on visit including pre-visit chart review and post-visit care and charting was 30 minutes.  ? ?I, Lizbeth Bark, RMA, am acting as Location manager for Everardo Pacific, FNP. ? ?I have reviewed the above documentation for accuracy and completeness, and I agree with the above. Everardo Pacific, FNP  ?

## 2021-09-05 ENCOUNTER — Other Ambulatory Visit: Payer: Self-pay

## 2021-09-05 ENCOUNTER — Emergency Department (HOSPITAL_COMMUNITY)
Admission: EM | Admit: 2021-09-05 | Discharge: 2021-09-06 | Disposition: A | Discharge status: Home / Self Care | Payer: No Typology Code available for payment source | Attending: Emergency Medicine | Admitting: Emergency Medicine

## 2021-09-05 ENCOUNTER — Emergency Department (HOSPITAL_COMMUNITY): Payer: No Typology Code available for payment source

## 2021-09-05 DIAGNOSIS — K429 Umbilical hernia without obstruction or gangrene: Secondary | ICD-10-CM | POA: Diagnosis not present

## 2021-09-05 DIAGNOSIS — S3991XA Unspecified injury of abdomen, initial encounter: Secondary | ICD-10-CM | POA: Diagnosis not present

## 2021-09-05 DIAGNOSIS — K439 Ventral hernia without obstruction or gangrene: Secondary | ICD-10-CM | POA: Diagnosis not present

## 2021-09-05 DIAGNOSIS — R000 Tachycardia, unspecified: Secondary | ICD-10-CM | POA: Diagnosis not present

## 2021-09-05 DIAGNOSIS — Z79899 Other long term (current) drug therapy: Secondary | ICD-10-CM | POA: Insufficient documentation

## 2021-09-05 DIAGNOSIS — M79661 Pain in right lower leg: Secondary | ICD-10-CM | POA: Diagnosis not present

## 2021-09-05 DIAGNOSIS — M25532 Pain in left wrist: Secondary | ICD-10-CM | POA: Diagnosis not present

## 2021-09-05 DIAGNOSIS — R0789 Other chest pain: Secondary | ICD-10-CM | POA: Diagnosis not present

## 2021-09-05 DIAGNOSIS — S3993XA Unspecified injury of pelvis, initial encounter: Secondary | ICD-10-CM | POA: Diagnosis not present

## 2021-09-05 DIAGNOSIS — S098XXA Other specified injuries of head, initial encounter: Secondary | ICD-10-CM | POA: Diagnosis not present

## 2021-09-05 DIAGNOSIS — M542 Cervicalgia: Secondary | ICD-10-CM | POA: Diagnosis not present

## 2021-09-05 DIAGNOSIS — S6992XA Unspecified injury of left wrist, hand and finger(s), initial encounter: Secondary | ICD-10-CM | POA: Diagnosis not present

## 2021-09-05 DIAGNOSIS — R918 Other nonspecific abnormal finding of lung field: Secondary | ICD-10-CM | POA: Diagnosis not present

## 2021-09-05 DIAGNOSIS — E119 Type 2 diabetes mellitus without complications: Secondary | ICD-10-CM | POA: Diagnosis not present

## 2021-09-05 DIAGNOSIS — M79605 Pain in left leg: Secondary | ICD-10-CM | POA: Insufficient documentation

## 2021-09-05 DIAGNOSIS — S199XXA Unspecified injury of neck, initial encounter: Secondary | ICD-10-CM | POA: Diagnosis not present

## 2021-09-05 DIAGNOSIS — M47814 Spondylosis without myelopathy or radiculopathy, thoracic region: Secondary | ICD-10-CM | POA: Diagnosis not present

## 2021-09-05 DIAGNOSIS — I10 Essential (primary) hypertension: Secondary | ICD-10-CM | POA: Insufficient documentation

## 2021-09-05 DIAGNOSIS — S8991XA Unspecified injury of right lower leg, initial encounter: Secondary | ICD-10-CM | POA: Diagnosis not present

## 2021-09-05 DIAGNOSIS — M79604 Pain in right leg: Secondary | ICD-10-CM | POA: Diagnosis not present

## 2021-09-05 DIAGNOSIS — S299XXA Unspecified injury of thorax, initial encounter: Secondary | ICD-10-CM | POA: Diagnosis not present

## 2021-09-05 DIAGNOSIS — M25512 Pain in left shoulder: Secondary | ICD-10-CM | POA: Diagnosis not present

## 2021-09-05 DIAGNOSIS — Y92410 Unspecified street and highway as the place of occurrence of the external cause: Secondary | ICD-10-CM | POA: Insufficient documentation

## 2021-09-05 DIAGNOSIS — Z9889 Other specified postprocedural states: Secondary | ICD-10-CM | POA: Diagnosis not present

## 2021-09-05 DIAGNOSIS — Y900 Blood alcohol level of less than 20 mg/100 ml: Secondary | ICD-10-CM | POA: Insufficient documentation

## 2021-09-05 DIAGNOSIS — S62115A Nondisplaced fracture of triquetrum [cuneiform] bone, left wrist, initial encounter for closed fracture: Secondary | ICD-10-CM | POA: Diagnosis not present

## 2021-09-05 DIAGNOSIS — S0990XA Unspecified injury of head, initial encounter: Secondary | ICD-10-CM | POA: Diagnosis not present

## 2021-09-05 DIAGNOSIS — M25539 Pain in unspecified wrist: Secondary | ICD-10-CM | POA: Diagnosis not present

## 2021-09-05 DIAGNOSIS — Q7649 Other congenital malformations of spine, not associated with scoliosis: Secondary | ICD-10-CM | POA: Diagnosis not present

## 2021-09-05 DIAGNOSIS — K5730 Diverticulosis of large intestine without perforation or abscess without bleeding: Secondary | ICD-10-CM | POA: Diagnosis not present

## 2021-09-05 DIAGNOSIS — M79662 Pain in left lower leg: Secondary | ICD-10-CM | POA: Diagnosis not present

## 2021-09-05 DIAGNOSIS — S62112A Displaced fracture of triquetrum [cuneiform] bone, left wrist, initial encounter for closed fracture: Secondary | ICD-10-CM | POA: Diagnosis not present

## 2021-09-05 DIAGNOSIS — S8992XA Unspecified injury of left lower leg, initial encounter: Secondary | ICD-10-CM | POA: Diagnosis not present

## 2021-09-05 DIAGNOSIS — M7989 Other specified soft tissue disorders: Secondary | ICD-10-CM | POA: Diagnosis not present

## 2021-09-05 DIAGNOSIS — R609 Edema, unspecified: Secondary | ICD-10-CM | POA: Diagnosis not present

## 2021-09-05 DIAGNOSIS — R079 Chest pain, unspecified: Secondary | ICD-10-CM | POA: Diagnosis not present

## 2021-09-05 DIAGNOSIS — Z7901 Long term (current) use of anticoagulants: Secondary | ICD-10-CM | POA: Insufficient documentation

## 2021-09-05 LAB — CBC
HCT: 37.5 % (ref 36.0–46.0)
Hemoglobin: 12 g/dL (ref 12.0–15.0)
MCH: 28.5 pg (ref 26.0–34.0)
MCHC: 32 g/dL (ref 30.0–36.0)
MCV: 89.1 fL (ref 80.0–100.0)
Platelets: 231 10*3/uL (ref 150–400)
RBC: 4.21 MIL/uL (ref 3.87–5.11)
RDW: 15.2 % (ref 11.5–15.5)
WBC: 6.4 10*3/uL (ref 4.0–10.5)
nRBC: 0 % (ref 0.0–0.2)

## 2021-09-05 LAB — COMPREHENSIVE METABOLIC PANEL
ALT: 24 U/L (ref 0–44)
AST: 31 U/L (ref 15–41)
Albumin: 3.8 g/dL (ref 3.5–5.0)
Alkaline Phosphatase: 44 U/L (ref 38–126)
Anion gap: 8 (ref 5–15)
BUN: 20 mg/dL (ref 8–23)
CO2: 25 mmol/L (ref 22–32)
Calcium: 8.7 mg/dL — ABNORMAL LOW (ref 8.9–10.3)
Chloride: 107 mmol/L (ref 98–111)
Creatinine, Ser: 0.97 mg/dL (ref 0.44–1.00)
GFR, Estimated: >60 mL/min (ref >60)
Glucose, Bld: 122 mg/dL — ABNORMAL HIGH (ref 70–99)
Potassium: 3.5 mmol/L (ref 3.5–5.1)
Sodium: 140 mmol/L (ref 135–145)
Total Bilirubin: <0.1 mg/dL — ABNORMAL LOW (ref 0.3–1.2)
Total Protein: 7.1 g/dL (ref 6.5–8.1)

## 2021-09-05 LAB — I-STAT CHEM 8, ED
BUN: 23 mg/dL (ref 8–23)
Calcium, Ion: 1 mmol/L — ABNORMAL LOW (ref 1.15–1.40)
Chloride: 107 mmol/L (ref 98–111)
Creatinine, Ser: 0.9 mg/dL (ref 0.44–1.00)
Glucose, Bld: 123 mg/dL — ABNORMAL HIGH (ref 70–99)
HCT: 39 % (ref 36.0–46.0)
Hemoglobin: 13.3 g/dL (ref 12.0–15.0)
Potassium: 3.9 mmol/L (ref 3.5–5.1)
Sodium: 141 mmol/L (ref 135–145)
TCO2: 27 mmol/L (ref 22–32)

## 2021-09-05 LAB — ETHANOL: Alcohol, Ethyl (B): <10 mg/dL (ref <10)

## 2021-09-05 LAB — PROTIME-INR
INR: 0.9 (ref 0.8–1.2)
Prothrombin Time: 11.8 seconds (ref 11.4–15.2)

## 2021-09-05 LAB — SAMPLE TO BLOOD BANK

## 2021-09-05 LAB — LACTIC ACID, PLASMA: Lactic Acid, Venous: 1.1 mmol/L (ref 0.5–1.9)

## 2021-09-05 MED ORDER — IOHEXOL 300 MG/ML  SOLN
100.0000 mL | Freq: Once | INTRAMUSCULAR | Status: AC | PRN
  Administered 2021-09-05: 100 mL via INTRAVENOUS

## 2021-09-05 MED ORDER — OXYCODONE-ACETAMINOPHEN 5-325 MG PO TABS
1.0000 | ORAL_TABLET | Freq: Once | ORAL | Status: AC
  Administered 2021-09-05: 1 via ORAL
  Filled 2021-09-05: qty 1

## 2021-09-05 MED ORDER — FENTANYL CITRATE PF 50 MCG/ML IJ SOSY
50.0000 ug | PREFILLED_SYRINGE | Freq: Once | INTRAMUSCULAR | Status: AC
  Administered 2021-09-05: 50 ug via INTRAVENOUS
  Filled 2021-09-05: qty 1

## 2021-09-05 MED ORDER — FENTANYL CITRATE (PF) 100 MCG/2ML IJ SOLN
INTRAMUSCULAR | Status: AC
  Filled 2021-09-05: qty 2

## 2021-09-05 NOTE — ED Notes (Signed)
Pt was able to ambulate approx 5 feet before having to come back to the bed d/t pain in bilateral legs. MD notified.  ?
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
## 2021-09-05 NOTE — Progress Notes (Signed)
?   09/05/21 1953  ?Clinical Encounter Type  ?Visited With Patient not available  ?Visit Type Initial;Trauma  ?Referral From Nurse  ?Consult/Referral To Chaplain  ? ?Chaplain Jorene Guest responded to page. The patient is being treated by the medical team, There is no support person present. Chaplain remains available for followup support as needed. This note was prepared by Jeanine Luz, M.Div..  For questions please contact by phone [phone].   ?

## 2021-09-05 NOTE — Progress Notes (Signed)
Orthopedic Tech Progress Note ?Patient Details:  ?Jennifer [name] ?Nov 14, 1950 ?[id number] ? ?Patient ID: Jennifer [name], female   DOB: 16-Mar-1951, 71 y.o.   MRN: [id number] ?I attended trauma page ?Jennifer [name] ?09/05/2021, 8:55 PM ? ?

## 2021-09-05 NOTE — ED Notes (Signed)
Ice pack given to patient and applied to arm with infiltration. Arm elevated with towels and blankets.  ?

## 2021-09-05 NOTE — ED Notes (Signed)
Pt IV infiltrated during CT scan. Contrast showing in bladder - approx estimate of infiltrate - 71m. MD at bedside. Difficulty getting IV access delayed scans. MD was able to place [place] IV.  ?

## 2021-09-05 NOTE — ED Notes (Signed)
Pt transported to CT with RN

## 2021-09-05 NOTE — ED Notes (Signed)
Ortho tech called 
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
## 2021-09-05 NOTE — Progress Notes (Signed)
Orthopedic Tech Progress Note ?Patient Details:  ?Jennifer [name] ?[birth year] ?[id number] ? ?Ortho Devices ?Type of Ortho Device: Volar splint ?Ortho Device/Splint Location: lue. in slight extension. ?Ortho Device/Splint Interventions: Ordered, Application, Adjustment ?  ?Post Interventions ?Patient Tolerated: Well ?Instructions Provided: Care of device, Adjustment of device ? ?Karolee Stamps ?09/05/2021, 11:03 PM ? ?

## 2021-09-06 MED ORDER — OXYCODONE HCL 5 MG PO TABS: 5.0000 mg | ORAL_TABLET | Freq: Four times a day (QID) | ORAL | 0 refills | Status: AC | PRN

## 2021-09-06 NOTE — Discharge Instructions (Addendum)
Please call your primary care doctor soon as possible to schedule appointment for follow-up in the next 2-3 days so they can reevaluate your right arm (where the contrast infiltrated). ?Please call the number provided as soon as possible for the [facility] to schedule a follow-up appointment for a left wrist fracture. ?You can take Tylenol every 6-8 hours as needed for pain.  For severe pain, you can also take oxycodone, 1 tablet every 6 hours as needed. ?

## 2021-09-07 DIAGNOSIS — S62115A Nondisplaced fracture of triquetrum [cuneiform] bone, left wrist, initial encounter for closed fracture: Secondary | ICD-10-CM | POA: Diagnosis not present

## 2021-09-08 ENCOUNTER — Encounter: Payer: Self-pay | Admitting: Internal Medicine

## 2021-09-08 ENCOUNTER — Ambulatory Visit (INDEPENDENT_AMBULATORY_CARE_PROVIDER_SITE_OTHER): Payer: Medicare Other | Admitting: Internal Medicine

## 2021-09-08 DIAGNOSIS — S20219D Contusion of unspecified front wall of thorax, subsequent encounter: Secondary | ICD-10-CM

## 2021-09-08 NOTE — Patient Instructions (Signed)
Continue Tylenol for pain Take oxycodone if needed You should gradually improve. If you have any blood in the urine or in the stools, stomach pain, major headaches: Seek medical attention.
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
## 2021-09-16 ENCOUNTER — Encounter: Payer: Self-pay | Admitting: Family

## 2021-09-19 ENCOUNTER — Emergency Department (HOSPITAL_BASED_OUTPATIENT_CLINIC_OR_DEPARTMENT_OTHER): Payer: No Typology Code available for payment source

## 2021-09-19 ENCOUNTER — Emergency Department (HOSPITAL_BASED_OUTPATIENT_CLINIC_OR_DEPARTMENT_OTHER)
Admission: EM | Admit: 2021-09-19 | Discharge: 2021-09-19 | Disposition: A | Discharge status: Home / Self Care | Payer: No Typology Code available for payment source | Attending: Emergency Medicine | Admitting: Emergency Medicine

## 2021-09-19 ENCOUNTER — Other Ambulatory Visit: Payer: Self-pay

## 2021-09-19 ENCOUNTER — Encounter (HOSPITAL_BASED_OUTPATIENT_CLINIC_OR_DEPARTMENT_OTHER): Payer: Self-pay | Admitting: Emergency Medicine

## 2021-09-19 DIAGNOSIS — E119 Type 2 diabetes mellitus without complications: Secondary | ICD-10-CM | POA: Insufficient documentation

## 2021-09-19 DIAGNOSIS — R011 Cardiac murmur, unspecified: Secondary | ICD-10-CM | POA: Insufficient documentation

## 2021-09-19 DIAGNOSIS — Y92410 Unspecified street and highway as the place of occurrence of the external cause: Secondary | ICD-10-CM | POA: Diagnosis not present

## 2021-09-19 DIAGNOSIS — S62115A Nondisplaced fracture of triquetrum [cuneiform] bone, left wrist, initial encounter for closed fracture: Secondary | ICD-10-CM | POA: Diagnosis not present

## 2021-09-19 DIAGNOSIS — M25531 Pain in right wrist: Secondary | ICD-10-CM | POA: Diagnosis not present

## 2021-09-19 DIAGNOSIS — R519 Headache, unspecified: Secondary | ICD-10-CM | POA: Diagnosis not present

## 2021-09-19 DIAGNOSIS — S20211A Contusion of right front wall of thorax, initial encounter: Secondary | ICD-10-CM | POA: Diagnosis not present

## 2021-09-19 DIAGNOSIS — Z79899 Other long term (current) drug therapy: Secondary | ICD-10-CM | POA: Diagnosis not present

## 2021-09-19 DIAGNOSIS — Z7901 Long term (current) use of anticoagulants: Secondary | ICD-10-CM | POA: Diagnosis not present

## 2021-09-19 DIAGNOSIS — S60212A Contusion of left wrist, initial encounter: Secondary | ICD-10-CM | POA: Diagnosis not present

## 2021-09-19 DIAGNOSIS — I10 Essential (primary) hypertension: Secondary | ICD-10-CM | POA: Diagnosis not present

## 2021-09-19 DIAGNOSIS — T148XXA Other injury of unspecified body region, initial encounter: Secondary | ICD-10-CM

## 2021-09-19 DIAGNOSIS — R5383 Other fatigue: Secondary | ICD-10-CM | POA: Insufficient documentation

## 2021-09-19 DIAGNOSIS — R110 Nausea: Secondary | ICD-10-CM | POA: Insufficient documentation

## 2021-09-19 DIAGNOSIS — M25532 Pain in left wrist: Secondary | ICD-10-CM

## 2021-09-19 DIAGNOSIS — R0789 Other chest pain: Secondary | ICD-10-CM | POA: Diagnosis not present

## 2021-09-19 DIAGNOSIS — S6992XA Unspecified injury of left wrist, hand and finger(s), initial encounter: Secondary | ICD-10-CM | POA: Diagnosis present

## 2021-09-19 DIAGNOSIS — R918 Other nonspecific abnormal finding of lung field: Secondary | ICD-10-CM | POA: Diagnosis not present

## 2021-09-19 LAB — CBC WITH DIFFERENTIAL/PLATELET
Abs Immature Granulocytes: 0.02 10*3/uL (ref 0.00–0.07)
Basophils Absolute: 0 10*3/uL (ref 0.0–0.1)
Basophils Relative: 1 %
Eosinophils Absolute: 0.1 10*3/uL (ref 0.0–0.5)
Eosinophils Relative: 2 %
HCT: 34.5 % — ABNORMAL LOW (ref 36.0–46.0)
Hemoglobin: 11.2 g/dL — ABNORMAL LOW (ref 12.0–15.0)
Immature Granulocytes: 0 %
Lymphocytes Relative: 26 %
Lymphs Abs: 1.7 10*3/uL (ref 0.7–4.0)
MCH: 28.6 pg (ref 26.0–34.0)
MCHC: 32.5 g/dL (ref 30.0–36.0)
MCV: 88.2 fL (ref 80.0–100.0)
Monocytes Absolute: 0.6 10*3/uL (ref 0.1–1.0)
Monocytes Relative: 10 %
Neutro Abs: 4 10*3/uL (ref 1.7–7.7)
Neutrophils Relative %: 61 %
Platelets: 274 10*3/uL (ref 150–400)
RBC: 3.91 MIL/uL (ref 3.87–5.11)
RDW: 15.9 % — ABNORMAL HIGH (ref 11.5–15.5)
WBC: 6.4 10*3/uL (ref 4.0–10.5)
nRBC: 0 % (ref 0.0–0.2)

## 2021-09-19 LAB — COMPREHENSIVE METABOLIC PANEL
ALT: 19 U/L (ref 0–44)
AST: 21 U/L (ref 15–41)
Albumin: 3.5 g/dL (ref 3.5–5.0)
Alkaline Phosphatase: 45 U/L (ref 38–126)
Anion gap: 8 (ref 5–15)
BUN: 20 mg/dL (ref 8–23)
CO2: 29 mmol/L (ref 22–32)
Calcium: 8.7 mg/dL — ABNORMAL LOW (ref 8.9–10.3)
Chloride: 105 mmol/L (ref 98–111)
Creatinine, Ser: 0.93 mg/dL (ref 0.44–1.00)
GFR, Estimated: >60 mL/min (ref >60)
Glucose, Bld: 103 mg/dL — ABNORMAL HIGH (ref 70–99)
Potassium: 3.3 mmol/L — ABNORMAL LOW (ref 3.5–5.1)
Sodium: 142 mmol/L (ref 135–145)
Total Bilirubin: 0.4 mg/dL (ref 0.3–1.2)
Total Protein: 6.9 g/dL (ref 6.5–8.1)

## 2021-09-19 LAB — LIPASE, BLOOD: Lipase: 20 U/L (ref 11–51)

## 2021-09-19 MED ORDER — TRAMADOL HCL 50 MG PO TABS: 50.0000 mg | ORAL_TABLET | Freq: Four times a day (QID) | ORAL | 0 refills | Status: DC | PRN

## 2021-09-19 MED ORDER — ONDANSETRON HCL 4 MG/2ML IJ SOLN
4.0000 mg | Freq: Once | INTRAMUSCULAR | Status: AC
  Administered 2021-09-19: 4 mg via INTRAVENOUS
  Filled 2021-09-19: qty 2

## 2021-09-19 MED ORDER — FENTANYL CITRATE PF 50 MCG/ML IJ SOSY
50.0000 ug | PREFILLED_SYRINGE | Freq: Once | INTRAMUSCULAR | Status: AC
  Administered 2021-09-19: 50 ug via INTRAVENOUS
  Filled 2021-09-19: qty 1

## 2021-09-19 MED ORDER — IOHEXOL 300 MG/ML  SOLN
100.0000 mL | Freq: Once | INTRAMUSCULAR | Status: AC | PRN
  Administered 2021-09-19: 100 mL via INTRAVENOUS

## 2021-09-19 NOTE — ED Notes (Signed)
ED Provider at bedside. 
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
## 2021-09-20 ENCOUNTER — Other Ambulatory Visit: Payer: Self-pay | Admitting: Internal Medicine

## 2021-09-20 ENCOUNTER — Telehealth: Payer: Self-pay

## 2021-09-20 ENCOUNTER — Other Ambulatory Visit: Payer: Self-pay | Admitting: Pulmonary Disease
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
## 2021-09-20 NOTE — Telephone Encounter (Signed)
Pt seen in ED 

## 2021-09-21 ENCOUNTER — Ambulatory Visit (INDEPENDENT_AMBULATORY_CARE_PROVIDER_SITE_OTHER): Payer: Medicare Other | Admitting: Bariatrics

## 2021-09-27 ENCOUNTER — Other Ambulatory Visit: Payer: Self-pay | Admitting: Hematology & Oncology

## 2021-09-29 ENCOUNTER — Ambulatory Visit (INDEPENDENT_AMBULATORY_CARE_PROVIDER_SITE_OTHER): Payer: Medicare Other | Admitting: Nurse Practitioner

## 2021-10-05 DIAGNOSIS — M7541 Impingement syndrome of right shoulder: Secondary | ICD-10-CM | POA: Diagnosis not present

## 2021-10-08 ENCOUNTER — Other Ambulatory Visit: Payer: Self-pay | Admitting: Pulmonary Disease

## 2021-10-11 ENCOUNTER — Ambulatory Visit (INDEPENDENT_AMBULATORY_CARE_PROVIDER_SITE_OTHER): Payer: Medicare Other | Admitting: Family Medicine

## 2021-10-11 ENCOUNTER — Encounter: Payer: Self-pay | Admitting: Family Medicine

## 2021-10-11 VITALS — BP 126/79 | HR 85 | Temp 98.1°F | Ht 68.0 in | Wt 246.4 lb

## 2021-10-11 DIAGNOSIS — Z8669 Personal history of other diseases of the nervous system and sense organs: Secondary | ICD-10-CM

## 2021-10-11 DIAGNOSIS — H6690 Otitis media, unspecified, unspecified ear: Secondary | ICD-10-CM | POA: Diagnosis not present

## 2021-10-11 DIAGNOSIS — R051 Acute cough: Secondary | ICD-10-CM

## 2021-10-11 MED ORDER — AMOXICILLIN-POT CLAVULANATE 875-125 MG PO TABS: 1.0000 | ORAL_TABLET | Freq: Two times a day (BID) | ORAL | 0 refills | Status: AC
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
## 2021-10-14 DIAGNOSIS — S52502A Unspecified fracture of the lower end of left radius, initial encounter for closed fracture: Secondary | ICD-10-CM | POA: Insufficient documentation

## 2021-10-14 DIAGNOSIS — M25532 Pain in left wrist: Secondary | ICD-10-CM | POA: Diagnosis not present

## 2021-10-17 ENCOUNTER — Ambulatory Visit (INDEPENDENT_AMBULATORY_CARE_PROVIDER_SITE_OTHER): Payer: Medicare Other | Admitting: Internal Medicine

## 2021-10-17 ENCOUNTER — Encounter: Payer: Self-pay | Admitting: Internal Medicine

## 2021-10-17 VITALS — BP 138/80 | HR 81 | Temp 98.1°F | Resp 18 | Ht 68.0 in | Wt 246.4 lb

## 2021-10-17 DIAGNOSIS — D500 Iron deficiency anemia secondary to blood loss (chronic): Secondary | ICD-10-CM

## 2021-10-17 DIAGNOSIS — E1169 Type 2 diabetes mellitus with other specified complication: Secondary | ICD-10-CM | POA: Diagnosis not present

## 2021-10-17 DIAGNOSIS — E669 Obesity, unspecified: Secondary | ICD-10-CM | POA: Diagnosis not present

## 2021-10-17 DIAGNOSIS — I10 Essential (primary) hypertension: Secondary | ICD-10-CM

## 2021-10-18 ENCOUNTER — Ambulatory Visit: Payer: Medicare Other | Admitting: Internal Medicine

## 2021-10-18 LAB — CBC WITH DIFFERENTIAL/PLATELET
Basophils Absolute: 0.1 10*3/uL (ref 0.0–0.1)
Basophils Relative: 1.4 % (ref 0.0–3.0)
Eosinophils Absolute: 0.1 10*3/uL (ref 0.0–0.7)
Eosinophils Relative: 3 % (ref 0.0–5.0)
HCT: 36.3 % (ref 36.0–46.0)
Hemoglobin: 11.8 g/dL — ABNORMAL LOW (ref 12.0–15.0)
Lymphocytes Relative: 42.4 % (ref 12.0–46.0)
Lymphs Abs: 1.5 10*3/uL (ref 0.7–4.0)
MCHC: 32.6 g/dL (ref 30.0–36.0)
MCV: 88.4 fl (ref 78.0–100.0)
Monocytes Absolute: 0.4 10*3/uL (ref 0.1–1.0)
Monocytes Relative: 10.7 % (ref 3.0–12.0)
Neutro Abs: 1.5 10*3/uL (ref 1.4–7.7)
Neutrophils Relative %: 42.5 % — ABNORMAL LOW (ref 43.0–77.0)
Platelets: 233 10*3/uL (ref 150.0–400.0)
RBC: 4.11 Mil/uL (ref 3.87–5.11)
RDW: 16.3 % — ABNORMAL HIGH (ref 11.5–15.5)
WBC: 3.6 10*3/uL — ABNORMAL LOW (ref 4.0–10.5)

## 2021-10-18 LAB — CALCIUM, IONIZED: Calcium, Ion: 5 mg/dL (ref 4.7–5.5)

## 2021-10-18 LAB — BASIC METABOLIC PANEL
BUN: 19 mg/dL (ref 6–23)
CO2: 31 mEq/L (ref 19–32)
Calcium: 9.2 mg/dL (ref 8.4–10.5)
Chloride: 103 mEq/L (ref 96–112)
Creatinine, Ser: 0.93 mg/dL (ref 0.40–1.20)
GFR: 63.01 mL/min (ref >60.00)
Glucose, Bld: 92 mg/dL (ref 70–99)
Potassium: 4.2 mEq/L (ref 3.5–5.1)
Sodium: 141 mEq/L (ref 135–145)

## 2021-10-18 LAB — IRON: Iron: 73 ug/dL (ref 42–145)

## 2021-10-18 LAB — FERRITIN: Ferritin: 82.7 ng/mL (ref 10.0–291.0)

## 2021-10-18 LAB — HEMOGLOBIN A1C: Hgb A1c MFr Bld: 6.2 % (ref 4.6–6.5)

## 2021-10-23 DIAGNOSIS — M25532 Pain in left wrist: Secondary | ICD-10-CM | POA: Diagnosis not present

## 2021-10-24 ENCOUNTER — Other Ambulatory Visit: Payer: Self-pay | Admitting: Hematology & Oncology

## 2021-10-24 ENCOUNTER — Ambulatory Visit: Payer: Medicare Other | Admitting: Internal Medicine

## 2021-10-31 ENCOUNTER — Telehealth: Payer: Self-pay

## 2021-10-31 NOTE — Telephone Encounter (Signed)
PA initiated via Covermymeds; KEY: [id number]. Awaiting determination.

## 2021-11-01 NOTE — Telephone Encounter (Signed)
PA approved. Effective 10/01/21 to 10/31/22.

## 2021-11-04 DIAGNOSIS — M25532 Pain in left wrist: Secondary | ICD-10-CM | POA: Diagnosis not present

## 2021-11-11 ENCOUNTER — Encounter: Payer: Self-pay | Admitting: Internal Medicine

## 2021-11-11 ENCOUNTER — Ambulatory Visit (HOSPITAL_BASED_OUTPATIENT_CLINIC_OR_DEPARTMENT_OTHER)
Admission: RE | Admit: 2021-11-11 | Discharge: 2021-11-11 | Disposition: A | Discharge status: Home / Self Care | Payer: Medicare Other | Source: Ambulatory Visit | Attending: Internal Medicine | Admitting: Internal Medicine

## 2021-11-11 ENCOUNTER — Ambulatory Visit (INDEPENDENT_AMBULATORY_CARE_PROVIDER_SITE_OTHER): Payer: Medicare Other | Admitting: Internal Medicine

## 2021-11-11 VITALS — BP 126/76 | HR 72 | Temp 98.2°F | Resp 18 | Ht 68.0 in | Wt 248.5 lb

## 2021-11-11 DIAGNOSIS — M79604 Pain in right leg: Secondary | ICD-10-CM | POA: Diagnosis not present

## 2021-11-11 DIAGNOSIS — M79605 Pain in left leg: Secondary | ICD-10-CM

## 2021-11-11 DIAGNOSIS — M7989 Other specified soft tissue disorders: Secondary | ICD-10-CM | POA: Insufficient documentation

## 2021-11-11 NOTE — Patient Instructions (Addendum)
Please go to the first floor and get the ultrasound of your lower extremities to rule out a clot.  If the test is negative, I would recommend to place a warm compress on the left calf, take some Tylenol and rest.

## 2021-11-11 NOTE — Progress Notes (Unsigned)
Subjective:    Patient ID: Jennifer [name], female    DOB: 11/11/1950, 71 y.o.   MRN: [id number]  DOS:  11/11/2021 Type of visit - description: Acute  Her chief complaint is pain >swelling Reports some pain and then   swelling from the left knee down. The knee itself is not painful or swollen. The left calf pain is a steady, worsening by walking. Has no unusual back pain. Denies any fever chills No chest pain or difficulty breathing No recent prolonged car trips or prolonged airplane trip. No  other muscular groups are affected  Review of Systems See above   Past Medical History:  Diagnosis Date   Allergy    Anemia    Arthritis    Cataract    bil cataracts removed   Chronic chest pain    Chronic fatigue 02/03/2017   Clotting disorder [facility])    Pulmonary [place] 2010 & 2015   Colon polyps    inflamed partially ulcerated hyperplastic polyp   Diabetes ([place]) 09/24/2013   no meds   Diverticulosis    Dysphagia esophageal phase - chronic after 3 fundoplications 87/56/4332   Esophageal stenosis    Stricture after fundoplication REDO   Fibromyalgia    GERD (gastroesophageal reflux disease)    Glaucoma suspect    Heart murmur    no problems    Hemorrhoids, internal, with bleeding 06/27/2017   History of hiatal hernia    Hypertension    IBS (irritable bowel syndrome)    Lactose intolerance    Mitral valve prolapse    OSA (obstructive sleep apnea)    on CPAP-Mild - Sleep study 2004   Pre-diabetes    Pulmonary embolus ([place])    2010 and 12-2013   Sleep apnea    wears CPAP   Thyroid nodule      dr. watching    Tinnitus    Subjective   Z[place] 2014   R flank    Past Surgical History:  Procedure Laterality Date   ABDOMINAL HYSTERECTOMY     still has 1 ovary    APPENDECTOMY     BRAIN SURGERY  1995   Stem surgery, Arnold Chiari Malformation   CATARACT EXTRACTION Bilateral 2017   cataracts, Dr. Herbert Deaner   CHOLECYSTECTOMY     COLONOSCOPY     ESOPHAGEAL MANOMETRY N/A  08/06/2019   Procedure: ESOPHAGEAL MANOMETRY (EM);  Surgeon: Gatha Mayer, MD;  Location: [facility];  Service: Endoscopy;  Laterality: N/A;   HEMORRHOID BANDING     HERNIA REPAIR     Hital Hernia   KNEE ARTHROSCOPY  05/16/2012   Procedure: ARTHROSCOPY KNEE;  Surgeon: Sharmon Revere, MD;  Location: [place];  Service: Orthopedics;  Laterality: Left;   KNEE ARTHROSCOPY Right 95/1884   NISSEN FUNDOPLICATION     X 3 lab, open x 2 last with mesh   POLYPECTOMY     REDUCTION MAMMAPLASTY Bilateral 2007   TOTAL KNEE ARTHROPLASTY Right 08/25/2019   Procedure: RIGHT TOTAL KNEE ARTHROPLASTY;  Surgeon: Gaynelle Arabian, MD;  Location: [facility];  Service: Orthopedics;  Laterality: Right;  38mn   UPPER GASTROINTESTINAL ENDOSCOPY      Current Outpatient Medications  Medication Instructions   acetaminophen (TYLENOL) 500 mg, Oral, Every 6 hours PRN   Aciphex 20 mg, Oral, 2 times daily   benzonatate (TESSALON) 100 MG capsule TAKE TWO CAPSULES BY MOUTH TWICE DAILY as needed for cough   cycloSPORINE (RESTASIS) 0.05 % ophthalmic emulsion Restasis 0.05 % eye  drops in a dropperette  INSTILL 1 DROP INTO EACH EYE TWICE DAILY   hyoscyamine (LEVSIN SL) 0.125 MG SL tablet DISSOLVE ONE TABLET IN MOUTH EVERY 4 HOURS AS NEEDED   Multiple Vitamins-Minerals (MULTIVITAMIN WITH MINERALS) tablet 1 tablet, Oral, Daily, Rainbow light    Prevagen 10 mg, Oral, Daily, prevagen    rosuvastatin (CRESTOR) 10 MG tablet TAKE ONE TABLET BY MOUTH DAILY AT BEDTIME   temazepam (RESTORIL) 30 MG capsule TAKE ONE CAPSULE BY MOUTH AT BEDTIME AS NEEDED FOR SLEEP   traMADol (ULTRAM) 50 mg, Oral, Every 6 hours PRN   triamterene-hydrochlorothiazide (MAXZIDE-25) 37.5-25 MG tablet TAKE HALF TABLET BY MOUTH DAILY   XARELTO 2.5 MG TABS tablet TAKE TWO TABLETS BY MOUTH DAILY       Objective:   Physical Exam BP 126/76   Pulse 72   Temp 98.2 F (36.8 C) (Oral)   Resp 18   Ht '5\' 8"'$  (1.727 m)   Wt 248 lb 8 oz (112.7 kg)   SpO2 97%   BMI  37.78 kg/m  General:   Well developed, NAD, BMI noted. HEENT:  Normocephalic . Face symmetric, atraumatic Lungs:  CTA B Normal respiratory effort, no intercostal retractions, no accessory muscle use. Heart: RRR,  no murmur.  Lower extremities:  Distal from the knees, the skin has some hyperpigmented areas but there is no redness, warmness; no openings. She is TTP throughout the  lower extremities distal from the knees bilaterally.  More so at the left calf. Normal pedal pulses. I measured the calves and they are essentially symmetric. Good pedal pulses bilateral Skin: Not pale. Not jaundice Neurologic:  alert & oriented X3.  Speech normal, gait is not antalgic  psych--  Cognition and judgment appear intact.  Cooperative with normal attention span and concentration.  Behavior appropriate. No anxious or depressed appearing.      Assessment     Assessment   DM, + neuropathy  HTN Anxiety, insomnia: On restoril prn  Thyroid nodule: Bx (non neoplastic goiter) 2014,  [place] 05-2016, 2022 (stable) Pulmonary: --OSA, sleep study 2004, on a CPAP --PEs:2010 and 12-2013 - saw hematology 12-2014, rec anticoag x 2 years (until 12-2015), then continue with a low-dose xarelto   life   -- multiple pulmonary nodule, last CT 11-2013: No further imaging suggested --RAD, inhalers rarely Fibromyalgia CV:  MVP,  carotid US 1-39%s GI: --GERD,chronic dysphagia s/p  fundoplication 3, esophageal stenosis- - April 2021: Barium study, esophageal manometry (see report) -- IBS --Hemorrhoids: S/p banding ~/2019 -- chronic right flank,RUQ pain : since GB surgery 2008 Etiology not clear but likely multifactorial --> Adhesions from the surgery, neuropathy (DM), postherpetic neuralgia, scarring from PE, etc.  MRI T spine done 06-2014: DJD  Zoster --  2014 right flank   PLAN L calf pain and swelling: Symptoms started 4 days ago, vital signs are stable, O2 sat normal. Although the patient reports swelling of  the left calf, I measured and is essentially symmetric compared to the R.  She is tender at B lower extremities. Must r/o  DVT although she has been compliant with Xarelto. Plan: Check [place], if negative conservative treatment

## 2021-11-13 NOTE — Assessment & Plan Note (Signed)
L calf pain and swelling: Symptoms started 4 days ago, vital signs are stable, O2 sat normal. Although the patient reports swelling of the left calf, I measured and is essentially symmetric compared to the R.  She is tender at B lower extremities. Must r/o  DVT although she has been compliant with Xarelto. Plan: Check [place], if negative conservative treatment

## 2021-11-14 DIAGNOSIS — M25532 Pain in left wrist: Secondary | ICD-10-CM | POA: Diagnosis not present

## 2021-11-21 DIAGNOSIS — M25532 Pain in left wrist: Secondary | ICD-10-CM | POA: Diagnosis not present

## 2021-11-25 ENCOUNTER — Other Ambulatory Visit: Payer: Self-pay | Admitting: Internal Medicine

## 2021-11-25 ENCOUNTER — Other Ambulatory Visit: Payer: Self-pay | Admitting: Hematology & Oncology

## 2021-11-28 DIAGNOSIS — M25532 Pain in left wrist: Secondary | ICD-10-CM | POA: Diagnosis not present

## 2021-11-30 ENCOUNTER — Encounter (INDEPENDENT_AMBULATORY_CARE_PROVIDER_SITE_OTHER): Payer: Self-pay

## 2021-12-05 ENCOUNTER — Inpatient Hospital Stay: Payer: Medicare Other | Attending: Hematology & Oncology

## 2021-12-05 ENCOUNTER — Inpatient Hospital Stay (HOSPITAL_BASED_OUTPATIENT_CLINIC_OR_DEPARTMENT_OTHER): Payer: Medicare Other | Admitting: Hematology & Oncology

## 2021-12-05 ENCOUNTER — Encounter: Payer: Self-pay | Admitting: Hematology & Oncology

## 2021-12-05 VITALS — BP 120/56 | HR 72 | Temp 98.0°F | Resp 20 | Ht 68.5 in | Wt 248.0 lb

## 2021-12-05 DIAGNOSIS — E119 Type 2 diabetes mellitus without complications: Secondary | ICD-10-CM | POA: Diagnosis not present

## 2021-12-05 DIAGNOSIS — M7989 Other specified soft tissue disorders: Secondary | ICD-10-CM | POA: Insufficient documentation

## 2021-12-05 DIAGNOSIS — D500 Iron deficiency anemia secondary to blood loss (chronic): Secondary | ICD-10-CM

## 2021-12-05 DIAGNOSIS — I2601 Septic pulmonary embolism with acute cor pulmonale: Secondary | ICD-10-CM | POA: Diagnosis not present

## 2021-12-05 DIAGNOSIS — I2699 Other pulmonary embolism without acute cor pulmonale: Secondary | ICD-10-CM | POA: Diagnosis not present

## 2021-12-05 DIAGNOSIS — Z7901 Long term (current) use of anticoagulants: Secondary | ICD-10-CM | POA: Diagnosis not present

## 2021-12-05 LAB — CMP (CANCER CENTER ONLY)
ALT: 15 U/L (ref 0–44)
AST: 17 U/L (ref 15–41)
Albumin: 4.4 g/dL (ref 3.5–5.0)
Alkaline Phosphatase: 55 U/L (ref 38–126)
Anion gap: 7 (ref 5–15)
BUN: 22 mg/dL (ref 8–23)
CO2: 33 mmol/L — ABNORMAL HIGH (ref 22–32)
Calcium: 9.8 mg/dL (ref 8.9–10.3)
Chloride: 102 mmol/L (ref 98–111)
Creatinine: 1.1 mg/dL — ABNORMAL HIGH (ref 0.44–1.00)
GFR, Estimated: 55 mL/min — ABNORMAL LOW (ref >60)
Glucose, Bld: 112 mg/dL — ABNORMAL HIGH (ref 70–99)
Potassium: 4.1 mmol/L (ref 3.5–5.1)
Sodium: 142 mmol/L (ref 135–145)
Total Bilirubin: 0.3 mg/dL (ref 0.3–1.2)
Total Protein: 6.9 g/dL (ref 6.5–8.1)

## 2021-12-05 LAB — CBC WITH DIFFERENTIAL (CANCER CENTER ONLY)
Abs Immature Granulocytes: 0.02 10*3/uL (ref 0.00–0.07)
Basophils Absolute: 0 10*3/uL (ref 0.0–0.1)
Basophils Relative: 1 %
Eosinophils Absolute: 0.1 10*3/uL (ref 0.0–0.5)
Eosinophils Relative: 3 %
HCT: 37.8 % (ref 36.0–46.0)
Hemoglobin: 12.2 g/dL (ref 12.0–15.0)
Immature Granulocytes: 1 %
Lymphocytes Relative: 42 %
Lymphs Abs: 1.7 10*3/uL (ref 0.7–4.0)
MCH: 28.9 pg (ref 26.0–34.0)
MCHC: 32.3 g/dL (ref 30.0–36.0)
MCV: 89.6 fL (ref 80.0–100.0)
Monocytes Absolute: 0.4 10*3/uL (ref 0.1–1.0)
Monocytes Relative: 11 %
Neutro Abs: 1.8 10*3/uL (ref 1.7–7.7)
Neutrophils Relative %: 42 %
Platelet Count: 216 10*3/uL (ref 150–400)
RBC: 4.22 MIL/uL (ref 3.87–5.11)
RDW: 14.9 % (ref 11.5–15.5)
Smear Review: NORMAL
WBC Count: 4 10*3/uL (ref 4.0–10.5)
nRBC: 0 % (ref 0.0–0.2)

## 2021-12-05 LAB — RETICULOCYTES
Immature Retic Fract: 12.1 % (ref 2.3–15.9)
RBC.: 4.22 MIL/uL (ref 3.87–5.11)
Retic Count, Absolute: 62.5 10*3/uL (ref 19.0–186.0)
Retic Ct Pct: 1.5 % (ref 0.4–3.1)

## 2021-12-05 LAB — LACTATE DEHYDROGENASE: LDH: 181 U/L (ref 98–192)

## 2021-12-05 LAB — FERRITIN: Ferritin: 51 ng/mL (ref 11–307)
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
## 2021-12-06 DIAGNOSIS — S52502A Unspecified fracture of the lower end of left radius, initial encounter for closed fracture: Secondary | ICD-10-CM | POA: Diagnosis not present

## 2021-12-06 LAB — IRON AND IRON BINDING CAPACITY (CC-WL,HP ONLY)
Iron: 59 ug/dL (ref 28–170)
Saturation Ratios: 18 % (ref 10.4–31.8)
TIBC: 323 ug/dL (ref 250–450)
UIBC: 264 ug/dL (ref 148–442)

## 2021-12-08 DIAGNOSIS — M25532 Pain in left wrist: Secondary | ICD-10-CM | POA: Diagnosis not present

## 2021-12-09 ENCOUNTER — Other Ambulatory Visit: Payer: Self-pay | Admitting: Internal Medicine

## 2021-12-12 ENCOUNTER — Telehealth: Payer: Self-pay | Admitting: *Deleted

## 2021-12-12 DIAGNOSIS — M25512 Pain in left shoulder: Secondary | ICD-10-CM | POA: Diagnosis not present

## 2021-12-12 DIAGNOSIS — M7542 Impingement syndrome of left shoulder: Secondary | ICD-10-CM | POA: Diagnosis not present

## 2021-12-12 DIAGNOSIS — M7541 Impingement syndrome of right shoulder: Secondary | ICD-10-CM | POA: Diagnosis not present

## 2021-12-12 DIAGNOSIS — M25511 Pain in right shoulder: Secondary | ICD-10-CM | POA: Diagnosis not present

## 2021-12-12 NOTE — Patient Outreach (Signed)
  Care Coordination   12/12/2021 Name: Jennifer [name] MRN: [id number] DOB: 04-23-1951   Care Coordination Outreach Attempts:  An unsuccessful telephone outreach was attempted today to offer the patient information about available care coordination services as a benefit of their health plan.   Follow Up Plan:  Additional outreach attempts will be made to offer the patient care coordination information and services.   Encounter Outcome:  No Answer  Care Coordination Interventions Activated:  Yes   Care Coordination Interventions:  No, not indicated    [facility] [phone]

## 2021-12-20 DIAGNOSIS — M26622 Arthralgia of left temporomandibular joint: Secondary | ICD-10-CM | POA: Insufficient documentation

## 2021-12-20 DIAGNOSIS — H9202 Otalgia, left ear: Secondary | ICD-10-CM | POA: Insufficient documentation

## 2021-12-20 DIAGNOSIS — M25512 Pain in left shoulder: Secondary | ICD-10-CM | POA: Diagnosis not present

## 2021-12-20 DIAGNOSIS — M542 Cervicalgia: Secondary | ICD-10-CM | POA: Diagnosis not present

## 2021-12-20 DIAGNOSIS — M25511 Pain in right shoulder: Secondary | ICD-10-CM | POA: Diagnosis not present

## 2021-12-21 ENCOUNTER — Encounter: Payer: Self-pay | Admitting: Pulmonary Disease

## 2021-12-21 ENCOUNTER — Ambulatory Visit (INDEPENDENT_AMBULATORY_CARE_PROVIDER_SITE_OTHER): Payer: Medicare Other | Admitting: Pulmonary Disease

## 2021-12-21 VITALS — BP 130/76 | HR 82 | Temp 98.1°F | Ht 69.0 in | Wt 253.4 lb

## 2021-12-21 DIAGNOSIS — Z9989 Dependence on other enabling machines and devices: Secondary | ICD-10-CM

## 2021-12-21 DIAGNOSIS — G4733 Obstructive sleep apnea (adult) (pediatric): Secondary | ICD-10-CM | POA: Diagnosis not present

## 2021-12-21 NOTE — Patient Instructions (Signed)
  X RX for auto BiPAP -EPAP min12, PS +4, IPAP max 22
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
## 2021-12-21 NOTE — Assessment & Plan Note (Signed)
CPAP download was reviewed which shows residual AHI of 12/hour mainly obstructive events on auto CPAP settings 12 to 20 cm with average pressure of 19 and maximum pressure of 19.5 cm.   She has residual events in spite of near maximum pressure on CPAP.  I do not feel like she will be able to tolerate higher pressures.  She will need BiPAP therapy. We will initiate auto BiPAP with pressures 4+4 cm, EPAP minimum of 12 cm with an IPAP max of 22. We will assess download in 3 months and adjust settings as required.  If events persist we will obtain a formal BiPAP titration study

## 2021-12-22 NOTE — Telephone Encounter (Signed)
Dr. Elsworth Soho, please see pts email. Pt is requesting yesterday's OV note be amended. Thanks.

## 2021-12-27 DIAGNOSIS — M25532 Pain in left wrist: Secondary | ICD-10-CM | POA: Diagnosis not present

## 2021-12-30 ENCOUNTER — Ambulatory Visit (INDEPENDENT_AMBULATORY_CARE_PROVIDER_SITE_OTHER): Payer: Medicare Other | Admitting: Internal Medicine

## 2021-12-30 ENCOUNTER — Other Ambulatory Visit: Payer: Self-pay | Admitting: Hematology & Oncology

## 2021-12-30 ENCOUNTER — Encounter: Payer: Self-pay | Admitting: Internal Medicine

## 2021-12-30 VITALS — BP 130/82 | HR 86 | Temp 98.6°F | Resp 18 | Ht 69.0 in | Wt 252.4 lb

## 2021-12-30 DIAGNOSIS — F419 Anxiety disorder, unspecified: Secondary | ICD-10-CM

## 2021-12-30 DIAGNOSIS — Z23 Encounter for immunization: Secondary | ICD-10-CM | POA: Diagnosis not present

## 2021-12-30 DIAGNOSIS — M79662 Pain in left lower leg: Secondary | ICD-10-CM

## 2021-12-30 MED ORDER — PROZAC 10 MG PO CAPS: ORAL_CAPSULE | ORAL | 1 refills | Status: DC

## 2021-12-30 MED ORDER — FLUOXETINE HCL 10 MG PO CAPS: ORAL_CAPSULE | ORAL | 1 refills | Status: DC
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
## 2021-12-30 NOTE — Patient Instructions (Addendum)
For anxiety: Start fluoxetine 10 mg: 1 tablet daily for 1 week, then 2 tablets daily. There is a potential interaction with your pain medication (tramadol) so take the tramadol only sporadically.   Recommend to proceed with the following vaccines at your pharmacy:  Flu shot- high dose Covid booster (bivalent)    GO TO THE FRONT DESK, [place] back for a checkup in 2 months
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
## 2022-01-02 DIAGNOSIS — M25532 Pain in left wrist: Secondary | ICD-10-CM | POA: Diagnosis not present

## 2022-01-03 DIAGNOSIS — S52502A Unspecified fracture of the lower end of left radius, initial encounter for closed fracture: Secondary | ICD-10-CM | POA: Diagnosis not present

## 2022-01-04 DIAGNOSIS — M25512 Pain in left shoulder: Secondary | ICD-10-CM | POA: Diagnosis not present

## 2022-01-04 DIAGNOSIS — M25511 Pain in right shoulder: Secondary | ICD-10-CM | POA: Diagnosis not present

## 2022-01-06 ENCOUNTER — Encounter: Payer: Self-pay | Admitting: Pulmonary Disease

## 2022-01-09 DIAGNOSIS — M25532 Pain in left wrist: Secondary | ICD-10-CM | POA: Diagnosis not present

## 2022-01-10 ENCOUNTER — Other Ambulatory Visit: Payer: Self-pay | Admitting: Family

## 2022-01-10 ENCOUNTER — Other Ambulatory Visit: Payer: Self-pay | Admitting: Hematology & Oncology

## 2022-01-11 ENCOUNTER — Telehealth: Payer: Self-pay

## 2022-01-11 NOTE — Patient Outreach (Signed)
  Care Coordination   Initial Visit Note   01/11/2022 Name: TIFFANYANN DEROO MRN: [id number] DOB: [birth year]  LANDRI DORSAINVIL is a 71 y.o. year old female who sees Larose Kells, Alda Berthold, MD for primary care. I spoke with  [name] by phone today.  What matters to the patients health and wellness today?  Patient denies any care coordination or resource needs at this time.     Goals Addressed             This Visit's Progress    COMPLETED: Care Coordination Activities       Care Coordination Interventions: Discussed care coordination program Encouraged patient to contact care coordinator and/or primary care provider if care coordination needs change.         SDOH assessments and interventions completed:  Yes  SDOH Interventions Today    Flowsheet Row Most Recent Value  SDOH Interventions   Food Insecurity Interventions Intervention Not Indicated  Housing Interventions Intervention Not Indicated  Transportation Interventions Intervention Not Indicated  Utilities Interventions Intervention Not Indicated        Care Coordination Interventions Activated:  Yes  Care Coordination Interventions:  Yes, provided   Follow up plan: No further intervention required.   Encounter Outcome:  Pt. Visit Completed   Thea Silversmith, RN, MSN, BSN, [place] Coordinator [phone]

## 2022-01-16 DIAGNOSIS — M25532 Pain in left wrist: Secondary | ICD-10-CM | POA: Diagnosis not present

## 2022-01-17 ENCOUNTER — Telehealth: Payer: Self-pay

## 2022-01-17 MED ORDER — TEMAZEPAM 30 MG PO CAPS: 30.0000 mg | ORAL_CAPSULE | Freq: Every evening | ORAL | 5 refills | Status: DC | PRN

## 2022-01-17 NOTE — Telephone Encounter (Signed)
Patient calling to find out the delay on getting Temazepam filled. Advised CMA forwarded to Dr Larose Kells first thing this morning so once he reviews, it will be called in. Patient kindly requests that it be sent today as she is out. Please call patient to advise when completed

## 2022-01-17 NOTE — Telephone Encounter (Signed)
Nurse Assessment Nurse: Self, RN, Nira Conn Date/Time (Eastern Time): 01/16/2022 6:00:56 PM Confirm and document reason for call. If symptomatic, describe symptoms. ---Caller has Insomnia and Needs RF Temazapam, Caller says [facility] tried three times to get office to get RF. Does the patient have any new or worsening symptoms? ---Yes Will a triage be completed? ---Yes Related visit to physician within the last 2 weeks? ---No Does the PT have any chronic conditions? (i.e. diabetes, asthma, this includes High risk factors for pregnancy, etc.) ---Yes Is this a behavioral health or substance abuse call? ---No Guidelines Guideline Title Affirmed Question Affirmed Notes Nurse Date/Time (Eastern Time) Insomnia Requesting medication for sleep ("sleeping pill") Self, RN, Nira Conn 01/16/2022 6:02:03 PM Disp. Time Eilene Ghazi Time) Disposition Final User 01/16/2022 6:03:42 PM SEE PCP WITHIN 3 DAYS Yes Self, RN, Heather Final Disposition 01/16/2022 6:03:42 PM SEE PCP WITHIN 3 DAYS Yes Self, RN, Heather PLEASE NOTE: All timestamps contained within this report are represented as [place] Standard Time. CONFIDENTIALTY NOTICE: This fax transmission is intended only for the addressee. It contains information that is legally privileged, confidential or otherwise protected from use or disclosure. If you are not the intended recipient, you are strictly prohibited from reviewing, disclosing, copying using or disseminating any of this information or taking any action in reliance on or regarding this information. If you have received this fax in error, please notify us immediately by telephone so that we can arrange for its return to [place]. Phone: [phone], Toll-Free: [phone], Fax: [phone] Page: 2 of 2 Call Id: [id number] [place] Disagree/Comply Comply Caller Understands Yes PreDisposition Call Doctor Care Advice Given Per Guideline SEE PCP WITHIN 3 DAYS: * You need to be seen within 2 or 3 days. CARE  ADVICE given per Insomnia (Adult) guideline. CALL BACK IF: * You become worse Comments User: Robynn Pane, RN Date/Time (Eastern Time): 01/16/2022 6:05:46 PM Caller was instructed to call office in Am for sleeping pill RF

## 2022-01-17 NOTE — Telephone Encounter (Signed)
PDMP okay, prescription sent 

## 2022-01-17 NOTE — Telephone Encounter (Signed)
Requesting: temazepam '30mg'$   Contract:07/13/21 UDS:07/13/21 Last Visit: 12/30/21 Next Visit: 03/01/22 Last Refill: 12/09/21 #30 and 0RF  Please Advise

## 2022-01-26 DIAGNOSIS — M25532 Pain in left wrist: Secondary | ICD-10-CM | POA: Diagnosis not present

## 2022-01-31 ENCOUNTER — Other Ambulatory Visit: Payer: Self-pay | Admitting: Hematology & Oncology

## 2022-02-06 DIAGNOSIS — M25532 Pain in left wrist: Secondary | ICD-10-CM | POA: Diagnosis not present

## 2022-02-07 DIAGNOSIS — S52502A Unspecified fracture of the lower end of left radius, initial encounter for closed fracture: Secondary | ICD-10-CM | POA: Diagnosis not present

## 2022-02-13 DIAGNOSIS — M25532 Pain in left wrist: Secondary | ICD-10-CM | POA: Diagnosis not present

## 2022-02-17 ENCOUNTER — Ambulatory Visit: Payer: Medicare Other | Admitting: Internal Medicine

## 2022-02-23 DIAGNOSIS — M25532 Pain in left wrist: Secondary | ICD-10-CM | POA: Diagnosis not present

## 2022-02-24 ENCOUNTER — Other Ambulatory Visit: Payer: Self-pay | Admitting: Internal Medicine

## 2022-02-27 DIAGNOSIS — M25532 Pain in left wrist: Secondary | ICD-10-CM | POA: Diagnosis not present

## 2022-03-01 ENCOUNTER — Ambulatory Visit: Payer: Medicare Other | Admitting: Internal Medicine

## 2022-03-01 ENCOUNTER — Encounter: Payer: Self-pay | Admitting: Internal Medicine

## 2022-03-06 ENCOUNTER — Telehealth: Payer: Self-pay | Admitting: Internal Medicine

## 2022-03-06 DIAGNOSIS — M25532 Pain in left wrist: Secondary | ICD-10-CM | POA: Diagnosis not present

## 2022-03-06 NOTE — Telephone Encounter (Signed)
Pt called stating that she was issued a letter regarding her cancelled appt with Dr. Larose Kells on 11.8.23 and sis not understand why she was receiving this letter. Letter was for a No Show/Same Day cancellation for this appt. After reviewing appt details, noticed that the appt was cancelled the day before by [facility] @ 4:34p but not as a no show. After speaking with both Laila and Kaylyn on this matter, advised pt that this was a mistake on our part and that she would not be charged $50 for this visit. Extended apologies for the mistake to the pt and advised her to disregard this letter. Pt acknowledged understanding.

## 2022-03-10 DIAGNOSIS — S52502A Unspecified fracture of the lower end of left radius, initial encounter for closed fracture: Secondary | ICD-10-CM | POA: Diagnosis not present

## 2022-03-13 ENCOUNTER — Other Ambulatory Visit: Payer: Self-pay | Admitting: Hematology & Oncology

## 2022-03-20 DIAGNOSIS — M79604 Pain in right leg: Secondary | ICD-10-CM | POA: Diagnosis not present

## 2022-03-20 DIAGNOSIS — R600 Localized edema: Secondary | ICD-10-CM | POA: Diagnosis not present

## 2022-03-20 DIAGNOSIS — M79605 Pain in left leg: Secondary | ICD-10-CM | POA: Diagnosis not present

## 2022-03-23 DIAGNOSIS — M25532 Pain in left wrist: Secondary | ICD-10-CM | POA: Diagnosis not present

## 2022-03-31 ENCOUNTER — Other Ambulatory Visit: Payer: Self-pay | Admitting: Hematology & Oncology

## 2022-03-31 DIAGNOSIS — M25532 Pain in left wrist: Secondary | ICD-10-CM | POA: Diagnosis not present

## 2022-04-03 DIAGNOSIS — M25571 Pain in right ankle and joints of right foot: Secondary | ICD-10-CM | POA: Diagnosis not present

## 2022-04-06 ENCOUNTER — Other Ambulatory Visit: Payer: Self-pay | Admitting: *Deleted

## 2022-04-06 DIAGNOSIS — M25532 Pain in left wrist: Secondary | ICD-10-CM | POA: Diagnosis not present

## 2022-04-06 DIAGNOSIS — M7989 Other specified soft tissue disorders: Secondary | ICD-10-CM

## 2022-04-10 ENCOUNTER — Other Ambulatory Visit: Payer: Self-pay | Admitting: Gastroenterology

## 2022-04-10 ENCOUNTER — Other Ambulatory Visit: Payer: Self-pay | Admitting: Internal Medicine

## 2022-04-13 DIAGNOSIS — M25532 Pain in left wrist: Secondary | ICD-10-CM | POA: Diagnosis not present
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
## 2022-04-14 ENCOUNTER — Other Ambulatory Visit: Payer: Self-pay | Admitting: Internal Medicine

## 2022-04-14 DIAGNOSIS — Z1231 Encounter for screening mammogram for malignant neoplasm of breast: Secondary | ICD-10-CM

## 2022-04-18 ENCOUNTER — Ambulatory Visit (INDEPENDENT_AMBULATORY_CARE_PROVIDER_SITE_OTHER): Payer: Medicare Other | Admitting: Physician Assistant

## 2022-04-18 ENCOUNTER — Ambulatory Visit (HOSPITAL_COMMUNITY)
Admission: RE | Admit: 2022-04-18 | Discharge: 2022-04-18 | Disposition: A | Discharge status: Home / Self Care | Payer: Medicare Other | Source: Ambulatory Visit | Attending: Vascular Surgery | Admitting: Vascular Surgery

## 2022-04-18 VITALS — BP 154/87 | HR 62 | Temp 97.3°F | Resp 16 | Ht 68.5 in | Wt 252.0 lb

## 2022-04-18 DIAGNOSIS — M7989 Other specified soft tissue disorders: Secondary | ICD-10-CM | POA: Diagnosis not present

## 2022-04-21 ENCOUNTER — Ambulatory Visit
Admission: RE | Admit: 2022-04-21 | Discharge: 2022-04-21 | Disposition: A | Discharge status: Home / Self Care | Payer: Medicare Other | Source: Ambulatory Visit | Attending: Internal Medicine | Admitting: Internal Medicine

## 2022-04-21 DIAGNOSIS — Z1231 Encounter for screening mammogram for malignant neoplasm of breast: Secondary | ICD-10-CM | POA: Diagnosis not present

## 2022-04-21 DIAGNOSIS — S52502A Unspecified fracture of the lower end of left radius, initial encounter for closed fracture: Secondary | ICD-10-CM | POA: Diagnosis not present

## 2022-04-21 DIAGNOSIS — M25532 Pain in left wrist: Secondary | ICD-10-CM | POA: Diagnosis not present

## 2022-04-27 ENCOUNTER — Other Ambulatory Visit: Payer: Self-pay | Admitting: Internal Medicine

## 2022-04-27 DIAGNOSIS — M25532 Pain in left wrist: Secondary | ICD-10-CM | POA: Diagnosis not present

## 2022-04-27 DIAGNOSIS — R928 Other abnormal and inconclusive findings on diagnostic imaging of breast: Secondary | ICD-10-CM

## 2022-05-09 ENCOUNTER — Ambulatory Visit
Admission: RE | Admit: 2022-05-09 | Discharge: 2022-05-09 | Disposition: A | Discharge status: Home / Self Care | Payer: Medicare Other | Source: Ambulatory Visit | Attending: Internal Medicine | Admitting: Internal Medicine

## 2022-05-09 ENCOUNTER — Other Ambulatory Visit: Payer: Self-pay | Admitting: Internal Medicine

## 2022-05-09 DIAGNOSIS — R928 Other abnormal and inconclusive findings on diagnostic imaging of breast: Secondary | ICD-10-CM

## 2022-05-09 DIAGNOSIS — N6489 Other specified disorders of breast: Secondary | ICD-10-CM | POA: Diagnosis not present

## 2022-05-09 DIAGNOSIS — M7989 Other specified soft tissue disorders: Secondary | ICD-10-CM

## 2022-05-11 DIAGNOSIS — M25532 Pain in left wrist: Secondary | ICD-10-CM | POA: Diagnosis not present

## 2022-05-25 ENCOUNTER — Other Ambulatory Visit: Payer: Self-pay

## 2022-05-25 ENCOUNTER — Other Ambulatory Visit: Payer: Self-pay | Admitting: Hematology & Oncology

## 2022-05-25 DIAGNOSIS — M25532 Pain in left wrist: Secondary | ICD-10-CM | POA: Diagnosis not present

## 2022-05-25 MED ORDER — ROSUVASTATIN CALCIUM 10 MG PO TABS: 10.0000 mg | ORAL_TABLET | Freq: Every day | ORAL | 1 refills | Status: DC

## 2022-06-01 DIAGNOSIS — M25532 Pain in left wrist: Secondary | ICD-10-CM | POA: Diagnosis not present

## 2022-06-02 DIAGNOSIS — S52502D Unspecified fracture of the lower end of left radius, subsequent encounter for closed fracture with routine healing: Secondary | ICD-10-CM | POA: Diagnosis not present

## 2022-06-05 ENCOUNTER — Inpatient Hospital Stay (HOSPITAL_BASED_OUTPATIENT_CLINIC_OR_DEPARTMENT_OTHER): Payer: Medicare Other | Admitting: Hematology & Oncology

## 2022-06-05 ENCOUNTER — Ambulatory Visit (INDEPENDENT_AMBULATORY_CARE_PROVIDER_SITE_OTHER): Payer: Medicare Other | Admitting: Internal Medicine

## 2022-06-05 ENCOUNTER — Encounter: Payer: Self-pay | Admitting: Internal Medicine

## 2022-06-05 ENCOUNTER — Encounter: Payer: Self-pay | Admitting: Hematology & Oncology

## 2022-06-05 ENCOUNTER — Inpatient Hospital Stay: Payer: Medicare Other | Attending: Hematology & Oncology

## 2022-06-05 ENCOUNTER — Other Ambulatory Visit: Payer: Self-pay

## 2022-06-05 VITALS — BP 132/68 | HR 80 | Temp 98.2°F | Resp 18 | Ht 68.5 in | Wt 254.0 lb

## 2022-06-05 VITALS — BP 129/58 | HR 67 | Temp 98.3°F | Resp 18 | Ht 68.5 in | Wt 253.0 lb

## 2022-06-05 DIAGNOSIS — Z79899 Other long term (current) drug therapy: Secondary | ICD-10-CM

## 2022-06-05 DIAGNOSIS — Z7901 Long term (current) use of anticoagulants: Secondary | ICD-10-CM | POA: Diagnosis not present

## 2022-06-05 DIAGNOSIS — Z86711 Personal history of pulmonary embolism: Secondary | ICD-10-CM | POA: Insufficient documentation

## 2022-06-05 DIAGNOSIS — I2601 Septic pulmonary embolism with acute cor pulmonale: Secondary | ICD-10-CM

## 2022-06-05 DIAGNOSIS — E119 Type 2 diabetes mellitus without complications: Secondary | ICD-10-CM

## 2022-06-05 DIAGNOSIS — M79662 Pain in left lower leg: Secondary | ICD-10-CM | POA: Diagnosis not present

## 2022-06-05 DIAGNOSIS — E785 Hyperlipidemia, unspecified: Secondary | ICD-10-CM | POA: Diagnosis not present

## 2022-06-05 DIAGNOSIS — I10 Essential (primary) hypertension: Secondary | ICD-10-CM

## 2022-06-05 DIAGNOSIS — F419 Anxiety disorder, unspecified: Secondary | ICD-10-CM | POA: Diagnosis not present

## 2022-06-05 LAB — CBC WITH DIFFERENTIAL (CANCER CENTER ONLY)
Abs Immature Granulocytes: 0.01 10*3/uL (ref 0.00–0.07)
Basophils Absolute: 0 10*3/uL (ref 0.0–0.1)
Basophils Relative: 1 %
Eosinophils Absolute: 0.2 10*3/uL (ref 0.0–0.5)
Eosinophils Relative: 4 %
HCT: 37.6 % (ref 36.0–46.0)
Hemoglobin: 11.8 g/dL — ABNORMAL LOW (ref 12.0–15.0)
Immature Granulocytes: 0 %
Lymphocytes Relative: 37 %
Lymphs Abs: 1.6 10*3/uL (ref 0.7–4.0)
MCH: 28 pg (ref 26.0–34.0)
MCHC: 31.4 g/dL (ref 30.0–36.0)
MCV: 89.1 fL (ref 80.0–100.0)
Monocytes Absolute: 0.7 10*3/uL (ref 0.1–1.0)
Monocytes Relative: 15 %
Neutro Abs: 1.9 10*3/uL (ref 1.7–7.7)
Neutrophils Relative %: 43 %
Platelet Count: 228 10*3/uL (ref 150–400)
RBC: 4.22 MIL/uL (ref 3.87–5.11)
RDW: 15.3 % (ref 11.5–15.5)
WBC Count: 4.4 10*3/uL (ref 4.0–10.5)
nRBC: 0 % (ref 0.0–0.2)

## 2022-06-05 LAB — CMP (CANCER CENTER ONLY)
ALT: 17 U/L (ref 0–44)
AST: 23 U/L (ref 15–41)
Albumin: 3.7 g/dL (ref 3.5–5.0)
Alkaline Phosphatase: 52 U/L (ref 38–126)
Anion gap: 6 (ref 5–15)
BUN: 26 mg/dL — ABNORMAL HIGH (ref 8–23)
CO2: 29 mmol/L (ref 22–32)
Calcium: 8.5 mg/dL — ABNORMAL LOW (ref 8.9–10.3)
Chloride: 104 mmol/L (ref 98–111)
Creatinine: 0.93 mg/dL (ref 0.44–1.00)
GFR, Estimated: >60 mL/min (ref >60)
Glucose, Bld: 95 mg/dL (ref 70–99)
Potassium: 4 mmol/L (ref 3.5–5.1)
Sodium: 139 mmol/L (ref 135–145)
Total Bilirubin: 0.6 mg/dL (ref 0.3–1.2)
Total Protein: 7.3 g/dL (ref 6.5–8.1)

## 2022-06-05 LAB — LACTATE DEHYDROGENASE: LDH: 177 U/L (ref 98–192)

## 2022-06-05 MED ORDER — FLUOXETINE HCL 10 MG PO CAPS: 20.0000 mg | ORAL_CAPSULE | Freq: Every day | ORAL | 1 refills | Status: DC

## 2022-06-05 NOTE — Progress Notes (Signed)
Subjective:    Patient ID: Jennifer [name], female    DOB: [birth year], 72 y.o.   MRN: [id number]  DOS:  06/05/2022 Type of visit - description: Routine checkup  Since the last office visit, is doing well. Using consistently a compression stocking and the left leg edema has resolved. Has some questions about vaccinations.  Review of Systems See above   Past Medical History:  Diagnosis Date   Allergy    Anemia    Arthritis    Cataract    bil cataracts removed   Chronic chest pain    Chronic fatigue 02/03/2017   Clotting disorder [facility])    Pulmonary Embolis 2010 & 2015   Colon polyps    inflamed partially ulcerated hyperplastic polyp   Diabetes ([place]) 09/24/2013   no meds   Diverticulosis    Dysphagia esophageal phase - chronic after 3 fundoplications [id number]   Esophageal stenosis    Stricture after fundoplication REDO   Fibromyalgia    GERD (gastroesophageal reflux disease)    Glaucoma suspect    Heart murmur    no problems    Hemorrhoids, internal, with bleeding 06/27/2017   History of hiatal hernia    Hypertension    IBS (irritable bowel syndrome)    Lactose intolerance    Mitral valve prolapse    OSA (obstructive sleep apnea)    on CPAP-Mild - Sleep study 2004   Pre-diabetes    Pulmonary embolus ([place])    2010 and 12-2013   Sleep apnea    wears CPAP   Thyroid nodule      dr. watching    Tinnitus    Subjective   Zoster 2014   R flank    Past Surgical History:  Procedure Laterality Date   ABDOMINAL HYSTERECTOMY     still has 1 ovary    APPENDECTOMY     BRAIN SURGERY  1995   Stem surgery, Arnold Chiari Malformation   CATARACT EXTRACTION Bilateral 2017   cataracts, Dr. Herbert Deaner   CHOLECYSTECTOMY     COLONOSCOPY     ESOPHAGEAL MANOMETRY N/A 08/06/2019   Procedure: ESOPHAGEAL MANOMETRY (EM);  Surgeon: Gatha Mayer, MD;  [facility];  Service: Endoscopy;  Laterality: N/A;   HEMORRHOID BANDING     HERNIA REPAIR     Hital Hernia   KNEE  ARTHROSCOPY  05/16/2012   Procedure: ARTHROSCOPY KNEE;  Surgeon: Sharmon Revere, MD;  Location: [place];  Service: Orthopedics;  Laterality: Left;   KNEE ARTHROSCOPY Right [id number]   NISSEN FUNDOPLICATION     X 3 lab, open x 2 last with mesh   POLYPECTOMY     REDUCTION MAMMAPLASTY Bilateral 2007   TOTAL KNEE ARTHROPLASTY Right 08/25/2019   Procedure: RIGHT TOTAL KNEE ARTHROPLASTY;  Surgeon: Gaynelle Arabian, MD;  [facility];  Service: Orthopedics;  Laterality: Right;  64mn   UPPER GASTROINTESTINAL ENDOSCOPY      Current Outpatient Medications  Medication Instructions   acetaminophen (TYLENOL) 500 mg, Oral, Every 6 hours PRN   Aciphex 20 mg, Oral, 2 times daily   cycloSPORINE (RESTASIS) 0.05 % ophthalmic emulsion Restasis 0.05 % eye drops in a dropperette  INSTILL 1 DROP INTO EACH EYE TWICE DAILY   FLUoxetine (PROZAC) 20 mg, Oral, Daily   hyoscyamine (LEVSIN SL) 0.125 MG SL tablet DISSOLVE ONE TABLET UNDER TONGUE EVERY FOUR HOURS AS NEEDED   lidocaine (LIDODERM) 5 % 1 patch, Daily   Multiple Vitamins-Minerals (MULTIVITAMIN WITH MINERALS) tablet 1 tablet, Oral,  Daily, Rainbow light    Prevagen 10 mg, Oral, Daily, prevagen    rosuvastatin (CRESTOR) 10 mg, Oral, Daily at bedtime   temazepam (RESTORIL) 30 mg, Oral, At bedtime PRN, for sleep   triamterene-hydrochlorothiazide (MAXZIDE-25) 37.5-25 MG tablet 0.5 tablets, Oral, Daily   XARELTO 2.5 MG TABS tablet TAKE TWO TABLETS BY MOUTH DAILY       Objective:   Physical Exam BP 132/68   Pulse 80   Temp 98.2 F (36.8 C) (Oral)   Resp 18   Ht 5' 8.5" (1.74 m)   Wt 254 lb (115.2 kg)   SpO2 97%   BMI 38.06 kg/m  General:   Well developed, NAD, BMI noted. HEENT:  Normocephalic . Face symmetric, atraumatic Lungs:  CTA B Normal respiratory effort, no intercostal retractions, no accessory muscle use. Heart: RRR,  no murmur.  Lower extremities: no pretibial edema bilaterally, using  compression stockings bilaterally Skin: Not  pale. Not jaundice Neurologic:  alert & oriented X3.  Speech normal, gait appropriate for age and unassisted Psych--  Cognition and judgment appear intact.  Cooperative with normal attention span and concentration.  Behavior appropriate. No anxious or depressed appearing.      Assessment     Assessment   DM, + neuropathy  HTN Anxiety, insomnia: On restoril prn  Thyroid nodule: Bx (non neoplastic goiter) 2014,  [place] 05-2016, 2022 (stable) Pulmonary: --OSA, sleep study 2004, on a CPAP --PEs:2010 and 12-2013 - saw hematology 12-2014, rec anticoag x 2 years (until 12-2015), then continue with a low-dose xarelto   life -- multiple pulmonary nodule, last CT 11-2013: No further imaging suggested --RAD, inhalers rarely Fibromyalgia CV:  MVP,  carotid US 1-39%s GI: --GERD,chronic dysphagia s/p  fundoplication 3, esophageal stenosis- - April 2021: Barium study, esophageal manometry (see report) -- IBS --Hemorrhoids: S/p banding ~/2019 -- chronic right flank,RUQ pain : since GB surgery 2008 Etiology not clear but likely multifactorial --> Adhesions from the surgery, neuropathy (DM), postherpetic neuralgia, scarring from PE, etc.  MRI T spine done 06-2014: DJD  Zoster --  2014 right flank   PLAN DM: Diet controlled, check A1c and micro. HTN: BP is good today, at home is in the 120s, continue Maxide, check BMP and CBC High cholesterol: On Crestor, check FLP. Anxiety insomnia: On Restoril, controlled. OSA: Reports good CPAP compliance Left calf pain and swelling: Orthopedics did not find any orthopedic causes for the problem, was referred to vascular, they Rx compression stockings which the patient has been using regularly with resolution of swelling and pain.  Was recommended to proceed with a CT venogram to r/o May Thurner but  CT never happened, rec to check with them and see if CT is still necessary since she is now asymptomatic . Preventive care: Up-to-date on COVID and flu vaccines Had a  recent mammogram Recommend RSV and Tdap. RTC 4 months

## 2022-06-05 NOTE — Patient Instructions (Addendum)
Vaccines I recommend:  Tdap (tetanus) RSV  Check the  blood pressure regularly BP GOAL is between 110/65 and  135/85. If it is consistently higher or lower, let me know      GO TO THE LAB : Get the blood work     [place], [place] back for  a check up in 4 months
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
## 2022-06-05 NOTE — Assessment & Plan Note (Signed)
DM: Diet controlled, check A1c and micro. HTN: BP is good today, at home is in the 120s, continue Maxide, check BMP and CBC High cholesterol: On Crestor, check FLP. Anxiety insomnia: On Restoril, controlled. OSA: Reports good CPAP compliance Left calf pain and swelling: Orthopedics did not find any orthopedic causes for the problem, was referred to vascular, they Rx compression stockings which the patient has been using regularly with resolution of swelling and pain.  Was recommended to proceed with a CT venogram to r/o May Thurner but  CT never happened, rec to check with them and see if CT is still necessary since she is now asymptomatic . Preventive care: Up-to-date on COVID and flu vaccines Had a recent mammogram Recommend RSV and Tdap. RTC 4 months

## 2022-06-06 LAB — CBC WITH DIFFERENTIAL/PLATELET
Basophils Absolute: 0.1 10*3/uL (ref 0.0–0.1)
Basophils Relative: 1.3 % (ref 0.0–3.0)
Eosinophils Absolute: 0.2 10*3/uL (ref 0.0–0.7)
Eosinophils Relative: 3.3 % (ref 0.0–5.0)
HCT: 37.8 % (ref 36.0–46.0)
Hemoglobin: 12.2 g/dL (ref 12.0–15.0)
Lymphocytes Relative: 31.9 % (ref 12.0–46.0)
Lymphs Abs: 1.4 10*3/uL (ref 0.7–4.0)
MCHC: 32.3 g/dL (ref 30.0–36.0)
MCV: 87.7 fl (ref 78.0–100.0)
Monocytes Absolute: 0.6 10*3/uL (ref 0.1–1.0)
Monocytes Relative: 12.3 % — ABNORMAL HIGH (ref 3.0–12.0)
Neutro Abs: 2.3 10*3/uL (ref 1.4–7.7)
Neutrophils Relative %: 51.2 % (ref 43.0–77.0)
Platelets: 262 10*3/uL (ref 150.0–400.0)
RBC: 4.31 Mil/uL (ref 3.87–5.11)
RDW: 15.7 % — ABNORMAL HIGH (ref 11.5–15.5)
WBC: 4.5 10*3/uL (ref 4.0–10.5)

## 2022-06-06 LAB — HEMOGLOBIN A1C: Hgb A1c MFr Bld: 6.3 % (ref 4.6–6.5)

## 2022-06-06 LAB — BASIC METABOLIC PANEL
BUN: 24 mg/dL — ABNORMAL HIGH (ref 6–23)
CO2: 31 mEq/L (ref 19–32)
Calcium: 8.8 mg/dL (ref 8.4–10.5)
Chloride: 105 mEq/L (ref 96–112)
Creatinine, Ser: 0.99 mg/dL (ref 0.40–1.20)
GFR: 58.2 mL/min — ABNORMAL LOW (ref >60.00)
Glucose, Bld: 78 mg/dL (ref 70–99)
Potassium: 3.9 mEq/L (ref 3.5–5.1)
Sodium: 142 mEq/L (ref 135–145)

## 2022-06-06 LAB — LIPID PANEL
Cholesterol: 133 mg/dL (ref 0–200)
HDL: 71.8 mg/dL (ref >39.00)
LDL Cholesterol: 51 mg/dL (ref 0–99)
NonHDL: 60.99
Total CHOL/HDL Ratio: 2
Triglycerides: 49 mg/dL (ref 0.0–149.0)
VLDL: 9.8 mg/dL (ref 0.0–40.0)

## 2022-06-06 LAB — MICROALBUMIN / CREATININE URINE RATIO
Creatinine,U: 167.1 mg/dL
Microalb Creat Ratio: 0.5 mg/g (ref 0.0–30.0)
Microalb, Ur: 0.8 mg/dL (ref 0.0–1.9)

## 2022-06-07 LAB — DRUG MONITORING PANEL 375977 , URINE
Alcohol Metabolites: NEGATIVE ng/mL (ref <500)
Alphahydroxyalprazolam: NEGATIVE ng/mL (ref <25)
Alphahydroxymidazolam: NEGATIVE ng/mL (ref <50)
Alphahydroxytriazolam: NEGATIVE ng/mL (ref <50)
Aminoclonazepam: NEGATIVE ng/mL (ref <25)
Amphetamines: NEGATIVE ng/mL (ref <500)
Barbiturates: NEGATIVE ng/mL (ref <300)
Benzodiazepines: POSITIVE ng/mL — AB (ref <100)
Cocaine Metabolite: NEGATIVE ng/mL (ref <150)
Desmethyltramadol: NEGATIVE ng/mL (ref <100)
Hydroxyethylflurazepam: NEGATIVE ng/mL (ref <50)
Lorazepam: NEGATIVE ng/mL (ref <50)
Marijuana Metabolite: NEGATIVE ng/mL (ref <20)
Nordiazepam: NEGATIVE ng/mL (ref <50)
Opiates: NEGATIVE ng/mL (ref <100)
Oxazepam: 1045 ng/mL — ABNORMAL HIGH (ref <50)
Oxycodone: NEGATIVE ng/mL (ref <100)
Temazepam: >6250 ng/mL — ABNORMAL HIGH (ref <50)
Tramadol: NEGATIVE ng/mL (ref <100)

## 2022-06-07 LAB — DM TEMPLATE

## 2022-06-08 DIAGNOSIS — M25532 Pain in left wrist: Secondary | ICD-10-CM | POA: Diagnosis not present

## 2022-06-12 ENCOUNTER — Encounter: Payer: Self-pay | Admitting: Family

## 2022-06-12 DIAGNOSIS — M25532 Pain in left wrist: Secondary | ICD-10-CM | POA: Diagnosis not present

## 2022-06-13 ENCOUNTER — Encounter: Payer: Self-pay | Admitting: Gastroenterology

## 2022-06-14 ENCOUNTER — Telehealth: Payer: Self-pay

## 2022-06-14 NOTE — Telephone Encounter (Signed)
Phone Number: [phone]   Hi Dr. Silverio Decamp, My [facility] said they have not received the prior authorization form for my Aciphex medication which was sent to your office on February 17th.  The pharmacy said they cannot fill the medication until they receive the prior authorization form back from your office.  I only have two more days left and I will be out of my Aciphex medication.  Would you please have your nurse or staff to send the completed form back to [facility] today so that I will not have to go without my medication for another day!    Thank you, Jennifer [name]

## 2022-06-15 NOTE — Telephone Encounter (Signed)
Patient is running out of medication. She has been stable on this medication. Please let me know what you are able to do for this patient. Thanks

## 2022-06-20 DIAGNOSIS — M25532 Pain in left wrist: Secondary | ICD-10-CM | POA: Diagnosis not present

## 2022-06-22 ENCOUNTER — Telehealth: Payer: Self-pay | Admitting: Pharmacy Technician

## 2022-06-22 NOTE — Telephone Encounter (Addendum)
Pharmacy Patient Advocate Encounter  Received notification from [place] that the request for prior authorization for ACIPHEX has been denied due to [place].    Please be advised we currently do not have a Pharmacist to review denials, therefore you will need to process appeals accordingly as needed. Thanks for your support at this time.   You may call [phone] or fax [phone], to appeal.   Received notification from [place] that prior authorization for Nadine is required.   PA submitted on 2.29.24 Key [id number] Status is pending

## 2022-06-23 ENCOUNTER — Encounter: Payer: Self-pay | Admitting: Family

## 2022-06-23 ENCOUNTER — Other Ambulatory Visit (HOSPITAL_COMMUNITY): Payer: Self-pay

## 2022-06-23 NOTE — Telephone Encounter (Signed)
PA has been approved and patient has been contacted.

## 2022-06-23 NOTE — Telephone Encounter (Signed)
Patient Advocate Encounter  Prior Authorization for [place] has been approved.    PA# [id number] Effective dates: 1.1.24 through 3.1.25  Called pharmacy to process and spoke with patient. She is aware that it has to be ordered and will be available on Monday afternoon/evening.  Received notification from [facility] that prior authorization for [place] is required.   PA submitted on 3.1.24 Key [id number] Status is pending

## 2022-06-28 ENCOUNTER — Other Ambulatory Visit: Payer: Self-pay

## 2022-06-28 DIAGNOSIS — M7989 Other specified soft tissue disorders: Secondary | ICD-10-CM

## 2022-06-29 DIAGNOSIS — H04123 Dry eye syndrome of bilateral lacrimal glands: Secondary | ICD-10-CM | POA: Diagnosis not present

## 2022-06-29 DIAGNOSIS — E119 Type 2 diabetes mellitus without complications: Secondary | ICD-10-CM | POA: Diagnosis not present

## 2022-06-29 DIAGNOSIS — H524 Presbyopia: Secondary | ICD-10-CM | POA: Diagnosis not present

## 2022-06-29 DIAGNOSIS — H26493 Other secondary cataract, bilateral: Secondary | ICD-10-CM | POA: Diagnosis not present

## 2022-06-29 DIAGNOSIS — H40023 Open angle with borderline findings, high risk, bilateral: Secondary | ICD-10-CM | POA: Diagnosis not present

## 2022-06-29 LAB — HM DIABETES EYE EXAM

## 2022-07-02 ENCOUNTER — Other Ambulatory Visit: Payer: Self-pay | Admitting: Hematology & Oncology

## 2022-07-03 DIAGNOSIS — M25532 Pain in left wrist: Secondary | ICD-10-CM | POA: Diagnosis not present

## 2022-07-10 DIAGNOSIS — M25532 Pain in left wrist: Secondary | ICD-10-CM | POA: Diagnosis not present

## 2022-07-12 ENCOUNTER — Encounter: Payer: Self-pay | Admitting: Family

## 2022-07-14 ENCOUNTER — Ambulatory Visit (HOSPITAL_COMMUNITY)
Admission: RE | Admit: 2022-07-14 | Discharge: 2022-07-14 | Disposition: A | Discharge status: Home / Self Care | Payer: Medicare Other | Source: Ambulatory Visit | Attending: Surgery | Admitting: Surgery

## 2022-07-14 ENCOUNTER — Telehealth: Payer: Self-pay | Admitting: Internal Medicine

## 2022-07-14 DIAGNOSIS — M7989 Other specified soft tissue disorders: Secondary | ICD-10-CM | POA: Diagnosis not present

## 2022-07-14 DIAGNOSIS — K828 Other specified diseases of gallbladder: Secondary | ICD-10-CM | POA: Diagnosis not present

## 2022-07-14 DIAGNOSIS — S52502A Unspecified fracture of the lower end of left radius, initial encounter for closed fracture: Secondary | ICD-10-CM | POA: Diagnosis not present

## 2022-07-14 LAB — POCT I-STAT CREATININE: Creatinine, Ser: 1 mg/dL (ref 0.44–1.00)

## 2022-07-14 MED ORDER — IOHEXOL 350 MG/ML SOLN
100.0000 mL | Freq: Once | INTRAVENOUS | Status: AC | PRN
  Administered 2022-07-14: 100 mL via INTRAVENOUS

## 2022-07-14 NOTE — Telephone Encounter (Signed)
PDMP okay, Rx sent 

## 2022-07-14 NOTE — Telephone Encounter (Signed)
Requesting: temazepam 30mg   Contract: 06/13/22 UDS: 06/05/22 Last Visit: 06/05/22 Next Visit: 10/04/22 Last Refill: 01/17/22 #30 and 5RF  Please Advise

## 2022-07-17 ENCOUNTER — Encounter: Payer: Self-pay | Admitting: Surgery

## 2022-07-17 ENCOUNTER — Ambulatory Visit (INDEPENDENT_AMBULATORY_CARE_PROVIDER_SITE_OTHER): Payer: Medicare Other | Admitting: Surgery

## 2022-07-17 VITALS — BP 122/86 | HR 76 | Temp 98.2°F | Resp 20 | Ht 68.5 in | Wt 255.0 lb

## 2022-07-17 DIAGNOSIS — M7989 Other specified soft tissue disorders: Secondary | ICD-10-CM | POA: Diagnosis not present

## 2022-07-17 DIAGNOSIS — I871 Compression of vein: Secondary | ICD-10-CM

## 2022-07-17 DIAGNOSIS — I872 Venous insufficiency (chronic) (peripheral): Secondary | ICD-10-CM | POA: Diagnosis not present
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
## 2022-07-21 ENCOUNTER — Other Ambulatory Visit: Payer: Self-pay | Admitting: Gastroenterology

## 2022-07-21 ENCOUNTER — Other Ambulatory Visit: Payer: Self-pay | Admitting: Internal Medicine

## 2022-07-21 ENCOUNTER — Other Ambulatory Visit: Payer: Self-pay | Admitting: Hematology & Oncology

## 2022-07-24 DIAGNOSIS — M25532 Pain in left wrist: Secondary | ICD-10-CM | POA: Diagnosis not present

## 2022-08-01 DIAGNOSIS — S52502A Unspecified fracture of the lower end of left radius, initial encounter for closed fracture: Secondary | ICD-10-CM | POA: Diagnosis not present

## 2022-08-02 DIAGNOSIS — M25532 Pain in left wrist: Secondary | ICD-10-CM | POA: Diagnosis not present

## 2022-08-10 ENCOUNTER — Encounter: Payer: Self-pay | Admitting: Family

## 2022-08-15 NOTE — Progress Notes (Signed)
Jennifer [name]    [id number]    22-Jul-1950  Primary Care Physician:Paz, Nolon Rod, MD  Referring Physician: Wanda Plump, MD [address] [place],  [place] [address]   Chief complaint: Nausea, hematuria No chief complaint on file.  HPI: 72 yr old very pleasant female here for follow-up visit for GERD and dysphagia.She has history of chronic dysphagia, s/p Nissen fundoplication X3. She is on chronic anticoagulation for DVT and PE.  I last saw her on 06-28-21. At that time she was complaining of  intermittent severe nausea. She had episodes of blood in her urine. Her rectal bleeding and anal fissure were improving.   Today,    GI Hx: CT ANGIO ABDOMEN PELVIS  W &/OR WO CONTRAST 07-14-22 VASCULAR 1. Mild compression on the left common iliac vein from the overlying iliac arteries. This can be a normal anatomic configuration. Based on the mild compression and the lack of significant venous collaterals, May-Thurner syndrome is unlikely. However, if there is high clinical concern, this could be further characterized with catheter directed venography and IVUS. Main pelvic veins are patent. 2. Arterial structures in the abdomen and pelvis are widely patent. NON-VASCULAR 1. No acute abnormality in the abdomen or pelvis. 2. Stable postsurgical changes. Mild intrahepatic and extrahepatic biliary dilatation has minimally changed and likely related to previous cholecystectomy. 3. Small ventral hernias containing fat. 4. Small hypodensities in the kidneys are too small to definitively characterize. These do not require dedicated follow-up.  EGD 05/02/2021 - LA Grade C esophagitis with no bleeding. Biopsied. - Esophageal mucosal changes suspicious for short-segment Barrett's esophagus. Biopsied. - A Nissen fundoplication was found. The wrap appears intact. - Gastritis. Biopsied. - Normal examined duodenum.   EGD 04-15-20 - Esophageal stenosis. Unable to dilate.  Dilated. - Esophageal mucosal changes suspicious for short-segment Barrett's esophagus. Biopsied.  - A Nissen fundoplication was found. The wrap appears intact.  - Gastritis. Biopsied.  - Normal examined duodenum. 1. Surgical [P], gastric antrum, gastric body - GASTRIC ANTRAL MUCOSA WITH MILD REACTIVE GASTROPATHY. - GASTRIC OXYNTIC MUCOSA WITH MILD CHRONIC GASTRITIS. - INTESTINAL METAPLASIA IS PRESENT FOCALLY IN ANTRUM, NEGATIVE FOR DYSPLASIA. - WARTHIN-STARRY STAIN IS NEGATIVE FOR HELICOBACTER PYLORI. 2. Surgical [P], esophagus, GE junction - SQUAMOCOLUMNAR ESOPHAGEAL MUCOSA WITH REACTIVE/REGENERATIVE CHANGE. - NEGATIVE FOR INTESTINAL METAPLASIA (GOBLET CELL METAPLASIA).  Esophageal manometry August 06, 2019: Findings suggestive of EG junction outflow obstruction with intact peristalsis, negative for achalasia.   EGD September 28, 2016: It is post Nissen fundoplication with intact wrap, dilated with TTS balloon to 18 mm   Colonoscopy January 17, 2021 - One 4 mm polyp in the transverse colon, removed with a cold snare. Resected and retrieved. Clip (MR conditional) was placed. - Mild diverticulosis in the sigmoid colon, in the descending colon, in the transverse colon and in the ascending colon. - Non-bleeding external and internal hemorrhoids. - Anal fissure.   Colonoscopy December 14, 2015: She had removal of small sessile polyps, one SSP and one tubular adenoma.  Due for recall colonoscopy August 2022    Current Outpatient Medications:    acetaminophen (TYLENOL) 500 MG tablet, Take 1 tablet (500 mg total) by mouth every 6 (six) hours as needed., Disp: 30 tablet, Rfl: 0   ACIPHEX 20 MG tablet, TAKE ONE TABLET BY MOUTH TWICE DAILY, Disp: 60 tablet, Rfl: 0   Apoaequorin (PREVAGEN) 10 MG CAPS, Take 10 mg by mouth daily. prevagen, Disp: ,  Rfl:    cycloSPORINE (RESTASIS) 0.05 % ophthalmic emulsion, Restasis 0.05 % eye drops in a dropperette  INSTILL 1 DROP INTO EACH EYE TWICE DAILY, Disp: ,  Rfl:    FLUoxetine (PROZAC) 10 MG capsule, Take 2 capsules (20 mg total) by mouth daily., Disp: 180 capsule, Rfl: 1   hyoscyamine (LEVSIN SL) 0.125 MG SL tablet, DISSOLVE ONE TABLET UNDER TONGUE EVERY FOUR HOURS AS NEEDED, Disp: 90 tablet, Rfl: 0   lidocaine (LIDODERM) 5 %, 1 patch daily., Disp: , Rfl:    Multiple Vitamins-Minerals (MULTIVITAMIN WITH MINERALS) tablet, Take 1 tablet by mouth daily. Rainbow light, Disp: , Rfl:    rosuvastatin (CRESTOR) 10 MG tablet, Take 1 tablet (10 mg total) by mouth at bedtime., Disp: 90 tablet, Rfl: 1   temazepam (RESTORIL) 30 MG capsule, Take 1 capsule by mouth at bedtime as needed for sleep, Disp: 30 capsule, Rfl: 3   triamterene-hydrochlorothiazide (MAXZIDE-25) 37.5-25 MG tablet, Take 0.5 tablets by mouth daily., Disp: 45 tablet, Rfl: 1   XARELTO 2.5 MG TABS tablet, TAKE TWO TABLETS BY MOUTH DAILY, Disp: 60 tablet, Rfl: 0   Allergies as of 06/28/2021 - Review Complete 06/08/2021  Allergen Reaction Noted   Morphine and related Anaphylaxis and Shortness Of Breath 12/01/2013   Sulfonamide derivatives Hives 12/02/2018   Promethazine hcl Other (See Comments) 12/02/2018    Past Medical History:  Diagnosis Date   Allergy    Anemia    Arthritis    Cataract    bil cataracts removed   Chronic chest pain    Chronic fatigue 02/03/2017   Clotting disorder (HCC)    Pulmonary Embolis 2010 & 2015   Colon polyps    inflamed partially ulcerated hyperplastic polyp   Diabetes (HCC) 09/24/2013   no meds   Diverticulosis    Dysphagia esophageal phase - chronic after 3 fundoplications 01/20/2014   Esophageal stenosis    Stricture after fundoplication REDO   Fibromyalgia    GERD (gastroesophageal reflux disease)    Glaucoma suspect    Heart murmur    no problems    Hemorrhoids, internal, with bleeding 06/27/2017   History of hiatal hernia    Hypertension    IBS (irritable bowel syndrome)    Lactose intolerance    Mitral valve prolapse    OSA (obstructive  sleep apnea)    on CPAP-Mild - Sleep study 2004   Pre-diabetes    Pulmonary embolus (HCC)    2010 and 12-2013   Sleep apnea    wears CPAP   Thyroid nodule      dr. watching    Tinnitus    Subjective   Zoster 2014   R flank    Past Surgical History:  Procedure Laterality Date   ABDOMINAL HYSTERECTOMY     still has 1 ovary    APPENDECTOMY     BRAIN SURGERY  1995   Stem surgery, Arnold Chiari Malformation   CATARACT EXTRACTION Bilateral 2017   cataracts, Dr. Elmer Picker   CHOLECYSTECTOMY     COLONOSCOPY     ESOPHAGEAL MANOMETRY N/A 08/06/2019   Procedure: ESOPHAGEAL MANOMETRY (EM);  Surgeon: Iva Boop, MD;  Location: [facility];  Service: Endoscopy;  Laterality: N/A;   HEMORRHOID BANDING     HERNIA REPAIR     Hital Hernia   KNEE ARTHROSCOPY  05/16/2012   Procedure: ARTHROSCOPY KNEE;  Surgeon: Kennieth Rad, MD;  Location: [facility];  Service: Orthopedics;  Laterality: Left;   KNEE ARTHROSCOPY Right 01/2018  NISSEN FUNDOPLICATION     X 3 lab, open x 2 last with mesh   POLYPECTOMY     REDUCTION MAMMAPLASTY Bilateral 2007   TOTAL KNEE ARTHROPLASTY Right 08/25/2019   Procedure: RIGHT TOTAL KNEE ARTHROPLASTY;  Surgeon: Ollen Gross, MD;  Location: [facility];  Service: Orthopedics;  Laterality: Right;    UPPER GASTROINTESTINAL ENDOSCOPY      Family History  Problem Relation Age of Onset   Diabetes Mother    Rheum arthritis Mother    Hypertension Mother    Hypertension Father    Alcoholism Father    Rheum arthritis Sister    Hypertension Sister    Rheum arthritis Sister    Rheum arthritis Sister    CAD Sister        MI age 85   Fibromyalgia Sister    Colon cancer Maternal Grandmother        dx with colon ca laster in life - lived to be 104   Crohn's disease Maternal Aunt    Stomach cancer Maternal Uncle    Lung cancer Maternal Uncle    Breast cancer Neg Hx    Esophageal cancer Neg Hx    Rectal cancer Neg Hx    Prostate cancer Neg Hx    Pancreatic cancer  Neg Hx    Colon polyps Neg Hx     Social History   Socioeconomic History   Marital status: Married    Spouse name: Not on file   Number of children: 1   Years of education: Not on file   Highest education level: Not on file  Occupational History   Occupation: retired form the [employer] 04-2015    Employer: [employer]  Tobacco Use   Smoking status: Never   Smokeless tobacco: Never   Tobacco comments:    never used tobacco  Vaping Use   Vaping Use: Never used  Substance and Sexual Activity   Alcohol use: No    Alcohol/week: 0.0 standard drinks   Drug use: No   Sexual activity: Not Currently    Birth control/protection: None  Other Topics Concern   Not on file  Social History Narrative   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   Married '70 for 2 years, divorced; re-married '86 for 2.5 years, divorced; re-married '90 for 1 year, divorced; re-married '00   1 son - '70   Sister - Died @ 33 [place]       Social Determinants of Corporate investment banker Strain: Not on Ship broker Insecurity: Not on file  Transportation Needs: Not on file  Physical Activity: Not on file  Stress: Not on file  Social Connections: Not on file  Intimate Partner Violence: Not on file    Review of systems: Review of Systems   Physical Exam: General: well-appearing ***  Eyes: sclera anicteric, no redness ENT: oral mucosa moist without lesions, no cervical or supraclavicular lymphadenopathy CV: RRR, no JVD, no peripheral edema Resp: clear to auscultation bilaterally, normal RR and effort noted GI: soft, no tenderness, with active bowel sounds. No guarding or palpable organomegaly noted. Skin; warm and dry, no rash or jaundice noted Neuro: awake, alert and oriented x 3. Normal gross motor function and fluent speech   Data Reviewed:  Reviewed labs, radiology imaging, old records and pertinent past GI work up   Assessment and Plan/Recommendations:  72 year old female with history of  chronic GERD, s/p Nissen fundoplication, history of adenomatous colon polyps, history of DVT  and PE on chronic anticoagulation with complaints of right side abdominal pain radiating to the back and pelvis and hematuria associated with chills  Obtain urinalysis and reflex culture to exclude UTI Possible nephrolithiasis, will obtain CT abdomen and pelvis for further evaluation Follow-up with PMD if symptoms worsen   Use stool softener docusate daily at bedtime to prevent constipation   Dysphagia secondary to EG J outflow obstruction due to fundoplication and esophageal dysmotility Continue with antireflux measures Chew well with small bites and sips of water   GERD: Continue Aciphex   Iron deficiency with low saturation, continue oral iron supplements   Return in 2 to 3 months  This visit required 40 minutes of patient care (this includes precharting, chart review, review of results, face-to-face time used for counseling as well as treatment plan and follow-up. The patient was provided an opportunity to ask questions and all were answered. The patient agreed with the plan and demonstrated an understanding of the instructions.  Iona Beard , MD    CC: Wanda Plump, MD   Ladona Mow Hewitt Shorts as a scribe for Marsa Aris, MD.,have documented all relevant documentation on the behalf of Marsa Aris, MD,as directed by  Marsa Aris, MD while in the presence of Marsa Aris, MD.   I, Marsa Aris, MD, have reviewed all documentation for this visit. The documentation on 08/15/22 for the exam, diagnosis, procedures, and orders are all accurate and complete.

## 2022-08-17 ENCOUNTER — Telehealth: Payer: Self-pay | Admitting: *Deleted

## 2022-08-17 ENCOUNTER — Encounter: Payer: Self-pay | Admitting: Gastroenterology

## 2022-08-17 ENCOUNTER — Ambulatory Visit (INDEPENDENT_AMBULATORY_CARE_PROVIDER_SITE_OTHER): Payer: Medicare Other | Admitting: Gastroenterology

## 2022-08-17 VITALS — BP 130/74 | HR 85 | Ht 68.5 in | Wt 257.0 lb

## 2022-08-17 DIAGNOSIS — D500 Iron deficiency anemia secondary to blood loss (chronic): Secondary | ICD-10-CM | POA: Diagnosis not present

## 2022-08-17 DIAGNOSIS — K5904 Chronic idiopathic constipation: Secondary | ICD-10-CM

## 2022-08-17 DIAGNOSIS — K219 Gastro-esophageal reflux disease without esophagitis: Secondary | ICD-10-CM | POA: Diagnosis not present

## 2022-08-17 DIAGNOSIS — R1310 Dysphagia, unspecified: Secondary | ICD-10-CM

## 2022-08-17 DIAGNOSIS — K602 Anal fissure, unspecified: Secondary | ICD-10-CM

## 2022-08-17 DIAGNOSIS — K625 Hemorrhage of anus and rectum: Secondary | ICD-10-CM

## 2022-08-17 MED ORDER — AMBULATORY NON FORMULARY MEDICATION: 0 refills | Status: DC

## 2022-08-17 MED ORDER — ACIPHEX 20 MG PO TBEC: 20.0000 mg | DELAYED_RELEASE_TABLET | Freq: Two times a day (BID) | ORAL | 11 refills | Status: DC

## 2022-08-17 NOTE — Telephone Encounter (Signed)
   Patient Name: Jennifer [name]  DOB: 1950-08-21 MRN: [id number]  Primary Cardiologist: None  Chart reviewed as part of pre-operative protocol coverage.  Does not appear to be an active patient with heart care.  Has not been seen in office since 2016.  Cardiology does not manage patient's Xarelto.  Recommendations for holding Xarelto prior to procedure should come from managing provider.  I will route this recommendation to the requesting party via Epic fax function and remove from pre-op pool.  Please call with questions.  Joylene Grapes, NP 08/17/2022, 12:26 PM

## 2022-08-17 NOTE — Patient Instructions (Signed)
We have sent a prescription for nitroglycerin 0.125% gel to [facility]. You should apply a pea size amount to your rectum three times daily x 6-8 weeks.  [facility]'s information is below: Address: [address] [place], [place] [address]  [phone]  *Please DO NOT go directly from our office to pick up this medication! Give the pharmacy 1 day to process the prescription as this is compounded and takes time to make.   We have sent the following medications to your pharmacy for you to pick up at your convenience: Aciphex  Use Benefiber 1 tablespoon three times a day with meals  Also you can use Colace 1-2 tablets daily at bedtime   Due to recent changes in healthcare laws, you may see the results of your imaging and laboratory studies on MyChart before your provider has had a chance to review them.  We understand that in some cases there may be results that are confusing or concerning to you. Not all laboratory results come back in the same time frame and the provider may be waiting for multiple results in order to interpret others.  Please give [place] 48 hours in order for your provider to thoroughly review all the results before contacting the office for clarification of your results.    I appreciate the  opportunity to care for you  Thank You   Marsa Aris , MD

## 2022-08-17 NOTE — Telephone Encounter (Signed)
[facility] Pre-operative Risk Assessment     Request for surgical clearance:     Endoscopy Procedure  What type of surgery is being performed?     Upper Endoscopy  When is this surgery scheduled?     09/05/2022  What type of clearance is required ?   Pharmacy  Are there any medications that need to be held prior to surgery and how long? Xarelto 2 days  Practice name and name of physician performing surgery?      [facility]  Dr Lavon Paganini  What is your office phone and fax number?      Phone- [phone]  Fax- [phone]  Anesthesia type (None, local, MAC, general) ?       MAC

## 2022-08-21 NOTE — Telephone Encounter (Signed)
Looks like Dr Arlan Organ was the last prescriber of this medication  (I routed to him)

## 2022-08-28 ENCOUNTER — Ambulatory Visit (INDEPENDENT_AMBULATORY_CARE_PROVIDER_SITE_OTHER): Payer: Medicare Other | Admitting: *Deleted

## 2022-08-28 DIAGNOSIS — Z0000 Encounter for general adult medical examination without abnormal findings: Secondary | ICD-10-CM | POA: Diagnosis not present

## 2022-08-28 NOTE — Patient Instructions (Signed)
[name] , Thank you for taking time to come for your Medicare Wellness Visit. I appreciate your ongoing commitment to your health goals. Please review the following plan we discussed and let me know if I can assist you in the future.     This is a list of the screening recommended for you and due dates:  Health Maintenance  Topic Date Due   Complete foot exam   03/04/2021   COVID-19 Vaccine (7 - 2023-24 season) 12/01/2022*   Flu Shot  11/23/2022   Hemoglobin A1C  12/04/2022   Mammogram  04/22/2023   Yearly kidney function blood test for diabetes  06/06/2023   Yearly kidney health urinalysis for diabetes  06/06/2023   Eye exam for diabetics  06/29/2023   Medicare Annual Wellness Visit  08/28/2023   Pneumonia Vaccine (3 of 3 - PPSV23 or PCV20) 12/06/2023   Colon Cancer Screening  01/18/2028   DTaP/Tdap/Td vaccine (3 - Td or Tdap) 07/06/2032   DEXA scan (bone density measurement)  Completed   Hepatitis C Screening: USPSTF Recommendation to screen - Ages 39-79 yo.  Completed   Zoster (Shingles) Vaccine  Completed   HPV Vaccine  Aged Out  *Topic was postponed. The date shown is not the original due date.     Next appointment: Follow up in one year for your annual wellness visit.   Preventive Care 65 Years and Older, Female Preventive care refers to lifestyle choices and visits with your health care provider that can promote health and wellness. What does preventive care include? A yearly physical exam. This is also called an annual well check. Dental exams once or twice a year. Routine eye exams. Ask your health care provider how often you should have your eyes checked. Personal lifestyle choices, including: Daily care of your teeth and gums. Regular physical activity. Eating a healthy diet. Avoiding tobacco and drug use. Limiting alcohol use. Practicing safe sex. Taking low-dose aspirin every day. Taking vitamin and mineral supplements as recommended by your health care  provider. What happens during an annual well check? The services and screenings done by your health care provider during your annual well check will depend on your age, overall health, lifestyle risk factors, and family history of disease. Counseling  Your health care provider may ask you questions about your: Alcohol use. Tobacco use. Drug use. Emotional well-being. Home and relationship well-being. Sexual activity. Eating habits. History of falls. Memory and ability to understand (cognition). Work and work Astronomer. Reproductive health. Screening  You may have the following tests or measurements: Height, weight, and BMI. Blood pressure. Lipid and cholesterol levels. These may be checked every 5 years, or more frequently if you are over 44 years old. Skin check. Lung cancer screening. You may have this screening every year starting at age 44 if you have a 30-pack-year history of smoking and currently smoke or have quit within the past 15 years. Fecal occult blood test (FOBT) of the stool. You may have this test every year starting at age 60. Flexible sigmoidoscopy or colonoscopy. You may have a sigmoidoscopy every 5 years or a colonoscopy every 10 years starting at age 30. Hepatitis C blood test. Hepatitis B blood test. Sexually transmitted disease (STD) testing. Diabetes screening. This is done by checking your blood sugar (glucose) after you have not eaten for a while (fasting). You may have this done every 1-3 years. Bone density scan. This is done to screen for osteoporosis. You may have this done starting at  age 108. Mammogram. This may be done every 1-2 years. Talk to your health care provider about how often you should have regular mammograms. Talk with your health care provider about your test results, treatment options, and if necessary, the need for more tests. Vaccines  Your health care provider may recommend certain vaccines, such as: Influenza vaccine. This is  recommended every year. Tetanus, diphtheria, and acellular pertussis (Tdap, Td) vaccine. You may need a Td booster every 10 years. Zoster vaccine. You may need this after age 50. Pneumococcal 13-valent conjugate (PCV13) vaccine. One dose is recommended after age 66. Pneumococcal polysaccharide (PPSV23) vaccine. One dose is recommended after age 45. Talk to your health care provider about which screenings and vaccines you need and how often you need them. This information is not intended to replace advice given to you by your health care provider. Make sure you discuss any questions you have with your health care provider. Document Released: 05/07/2015 Document Revised: 12/29/2015 Document Reviewed: 02/09/2015 Elsevier Interactive Patient Education  2017 [employer] Prevention in the Home Falls can cause injuries. They can happen to people of all ages. There are many things you can do to make your home safe and to help prevent falls. What can I do on the outside of my home? Regularly fix the edges of walkways and driveways and fix any cracks. Remove anything that might make you trip as you walk through a door, such as a raised step or threshold. Trim any bushes or trees on the path to your home. Use bright outdoor lighting. Clear any walking paths of anything that might make someone trip, such as rocks or tools. Regularly check to see if handrails are loose or broken. Make sure that both sides of any steps have handrails. Any raised decks and porches should have guardrails on the edges. Have any leaves, snow, or ice cleared regularly. Use sand or salt on walking paths during winter. Clean up any spills in your garage right away. This includes oil or grease spills. What can I do in the bathroom? Use night lights. Install grab bars by the toilet and in the tub and shower. Do not use towel bars as grab bars. Use non-skid mats or decals in the tub or shower. If you need to sit down in  the shower, use a plastic, non-slip stool. Keep the floor dry. Clean up any water that spills on the floor as soon as it happens. Remove soap buildup in the tub or shower regularly. Attach bath mats securely with double-sided non-slip rug tape. Do not have throw rugs and other things on the floor that can make you trip. What can I do in the bedroom? Use night lights. Make sure that you have a light by your bed that is easy to reach. Do not use any sheets or blankets that are too big for your bed. They should not hang down onto the floor. Have a firm chair that has side arms. You can use this for support while you get dressed. Do not have throw rugs and other things on the floor that can make you trip. What can I do in the kitchen? Clean up any spills right away. Avoid walking on wet floors. Keep items that you use a lot in easy-to-reach places. If you need to reach something above you, use a strong step stool that has a grab bar. Keep electrical cords out of the way. Do not use floor polish or wax that makes floors  slippery. If you must use wax, use non-skid floor wax. Do not have throw rugs and other things on the floor that can make you trip. What can I do with my stairs? Do not leave any items on the stairs. Make sure that there are handrails on both sides of the stairs and use them. Fix handrails that are broken or loose. Make sure that handrails are as long as the stairways. Check any carpeting to make sure that it is firmly attached to the stairs. Fix any carpet that is loose or worn. Avoid having throw rugs at the top or bottom of the stairs. If you do have throw rugs, attach them to the floor with carpet tape. Make sure that you have a light switch at the top of the stairs and the bottom of the stairs. If you do not have them, ask someone to add them for you. What else can I do to help prevent falls? Wear shoes that: Do not have high heels. Have rubber bottoms. Are comfortable  and fit you well. Are closed at the toe. Do not wear sandals. If you use a stepladder: Make sure that it is fully opened. Do not climb a closed stepladder. Make sure that both sides of the stepladder are locked into place. Ask someone to hold it for you, if possible. Clearly mark and make sure that you can see: Any grab bars or handrails. First and last steps. Where the edge of each step is. Use tools that help you move around (mobility aids) if they are needed. These include: Canes. Walkers. Scooters. Crutches. Turn on the lights when you go into a dark area. Replace any light bulbs as soon as they burn out. Set up your furniture so you have a clear path. Avoid moving your furniture around. If any of your floors are uneven, fix them. If there are any pets around you, be aware of where they are. Review your medicines with your doctor. Some medicines can make you feel dizzy. This can increase your chance of falling. Ask your doctor what other things that you can do to help prevent falls. This information is not intended to replace advice given to you by your health care provider. Make sure you discuss any questions you have with your health care provider. Document Released: 02/04/2009 Document Revised: 09/16/2015 Document Reviewed: 05/15/2014 Elsevier Interactive Patient Education  2017 [employer].

## 2022-08-28 NOTE — Progress Notes (Signed)
Subjective:   Jennifer [name] is a 72 y.o. female who presents for Medicare Annual (Subsequent) preventive examination.  I connected with  AMIRIAH WENIGER on 08/28/22 by a audio enabled telemedicine application and verified that I am speaking with the correct person using two identifiers.  Patient Location: Home  Provider Location: Office/Clinic  I discussed the limitations of evaluation and management by telemedicine. The patient expressed understanding and agreed to proceed.   Review of Systems     Cardiac Risk Factors include: advanced age (>43men, >41 women);dyslipidemia;hypertension;diabetes mellitus     Objective:    There were no vitals filed for this visit. There is no height or weight on file to calculate BMI.     08/28/2022    2:25 PM 06/05/2022    2:47 PM 12/05/2021    3:10 PM 09/19/2021    3:12 PM 09/05/2021    8:30 PM 08/25/2021    3:16 PM 06/06/2021    3:40 PM  Advanced Directives  Does Patient Have a Medical Advance Directive? No No No No No No Yes  Type of Advance Directive       Living will  Does patient want to make changes to medical advance directive?  No - Patient declined     No - Patient declined  Would patient like information on creating a medical advance directive? No - Patient declined  No - Patient declined No - Patient declined  No - Patient declined     Current Medications (verified) Outpatient Encounter Medications as of 08/28/2022  Medication Sig   acetaminophen (TYLENOL) 500 MG tablet Take 1 tablet (500 mg total) by mouth every 6 (six) hours as needed.   ACIPHEX 20 MG tablet Take 1 tablet (20 mg total) by mouth 2 (two) times daily.   AMBULATORY NON FORMULARY MEDICATION Medication Name: Nitroglycerin ointment use pea sized amount per rectum three times a day for 8 weeks   Apoaequorin (PREVAGEN) 10 MG CAPS Take 10 mg by mouth daily. prevagen   cycloSPORINE (RESTASIS) 0.05 % ophthalmic emulsion Restasis 0.05 % eye drops in a dropperette  INSTILL 1 DROP  INTO EACH EYE TWICE DAILY   FLUoxetine (PROZAC) 10 MG capsule Take 2 capsules (20 mg total) by mouth daily.   hyoscyamine (LEVSIN SL) 0.125 MG SL tablet DISSOLVE ONE TABLET UNDER TONGUE EVERY FOUR HOURS AS NEEDED   lidocaine (LIDODERM) 5 % 1 patch daily.   Multiple Vitamins-Minerals (MULTIVITAMIN WITH MINERALS) tablet Take 1 tablet by mouth daily. Rainbow light   rosuvastatin (CRESTOR) 10 MG tablet Take 1 tablet (10 mg total) by mouth at bedtime.   temazepam (RESTORIL) 30 MG capsule Take 1 capsule by mouth at bedtime as needed for sleep   triamterene-hydrochlorothiazide (MAXZIDE-25) 37.5-25 MG tablet Take 0.5 tablets by mouth daily.   XARELTO 2.5 MG TABS tablet TAKE TWO TABLETS BY MOUTH DAILY   No facility-administered encounter medications on file as of 08/28/2022.    Allergies (verified) Morphine and related, Sulfonamide derivatives, and Promethazine hcl   History: Past Medical History:  Diagnosis Date   Allergy    Anemia    Arthritis    Cataract    bil cataracts removed   Chronic chest pain    Chronic fatigue 02/03/2017   Clotting disorder [facility])    Pulmonary Embolis 2010 & 2015   Colon polyps    inflamed partially ulcerated hyperplastic polyp   Diabetes (HCC) 09/24/2013   no meds   Diverticulosis    Dysphagia esophageal phase -  chronic after 3 fundoplications 01/20/2014   Esophageal stenosis    Stricture after fundoplication REDO   Fibromyalgia    GERD (gastroesophageal reflux disease)    Glaucoma suspect    Heart murmur    no problems    Hemorrhoids, internal, with bleeding 06/27/2017   History of hiatal hernia    Hypertension    IBS (irritable bowel syndrome)    Lactose intolerance    Mitral valve prolapse    OSA (obstructive sleep apnea)    on CPAP-Mild - Sleep study 2004   Pre-diabetes    Pulmonary embolus (HCC)    2010 and 12-2013   Sleep apnea    wears CPAP   Thyroid nodule      dr. watching    Tinnitus    Subjective   Zoster 2014   R flank   Past  Surgical History:  Procedure Laterality Date   ABDOMINAL HYSTERECTOMY     still has 1 ovary    APPENDECTOMY     BRAIN SURGERY  1995   Stem surgery, Arnold Chiari Malformation   CATARACT EXTRACTION Bilateral 2017   cataracts, Dr. Elmer Picker   CHOLECYSTECTOMY     COLONOSCOPY     ESOPHAGEAL MANOMETRY N/A 08/06/2019   Procedure: ESOPHAGEAL MANOMETRY (EM);  Surgeon: Iva Boop, MD;  Location: [facility];  Service: Endoscopy;  Laterality: N/A;   HEMORRHOID BANDING     HERNIA REPAIR     Hital Hernia   KNEE ARTHROSCOPY  05/16/2012   Procedure: ARTHROSCOPY KNEE;  Surgeon: Kennieth Rad, MD;  Location: [facility] OR;  Service: Orthopedics;  Laterality: Left;   KNEE ARTHROSCOPY Right 01/2018   NISSEN FUNDOPLICATION     X 3 lab, open x 2 last with mesh   POLYPECTOMY     REDUCTION MAMMAPLASTY Bilateral 2007   TOTAL KNEE ARTHROPLASTY Right 08/25/2019   Procedure: RIGHT TOTAL KNEE ARTHROPLASTY;  Surgeon: Ollen Gross, MD;  Location: [facility];  Service: Orthopedics;  Laterality: Right;    UPPER GASTROINTESTINAL ENDOSCOPY     Family History  Problem Relation Age of Onset   Diabetes Mother    Rheum arthritis Mother    Hypertension Mother    Hypertension Father    Alcoholism Father    Rheum arthritis Sister    Hypertension Sister    Arthritis Sister    Rheum arthritis Sister    Rheum arthritis Sister    CAD Sister        MI age 42   Fibromyalgia Sister    Colon cancer Maternal Grandmother        dx with colon ca laster in life - lived to be 104   Crohn's disease Maternal Aunt    Stomach cancer Maternal Uncle    Lung cancer Maternal Uncle    Breast cancer Neg Hx    Esophageal cancer Neg Hx    Rectal cancer Neg Hx    Prostate cancer Neg Hx    Pancreatic cancer Neg Hx    Colon polyps Neg Hx    Social History   Socioeconomic History   Marital status: Married    Spouse name: Not on file   Number of children: 1   Years of education: Not on file   Highest education level: Not on  file  Occupational History   Occupation: retired form the [employer] 04-2015    Employer: [employer]  Tobacco Use   Smoking status: Never   Smokeless tobacco: Never   Tobacco comments:  never used tobacco  Vaping Use   Vaping Use: Never used  Substance and Sexual Activity   Alcohol use: No    Alcohol/week: 0.0 standard drinks of alcohol   Drug use: No   Sexual activity: Not Currently    Birth control/protection: None  Other Topics Concern   Not on file  Social History Narrative   [employer] Graduate - Technical school   Married '70 for 2 years, divorced; re-married '86 for 2.5 years, divorced; re-married '90 for 1 year, divorced; re-married '00   1 son - '4   Sister - Died @ 66 [place]       Social Determinants of Corporate investment banker Strain: Low Risk  (08/28/2022)   Overall Financial Resource Strain (CARDIA)    Difficulty of Paying Living Expenses: Not hard at all  Food Insecurity: No Food Insecurity (08/28/2022)   Hunger Vital Sign    Worried About Running Out of Food in the Last Year: Never true    Ran Out of Food in the Last Year: Never true  Transportation Needs: No Transportation Needs (08/28/2022)   PRAPARE - Administrator, Civil Service (Medical): No    Lack of Transportation (Non-Medical): No  Physical Activity: Insufficiently Active (08/28/2022)   Exercise Vital Sign    Days of Exercise per Week: 1 day    Minutes of Exercise per Session: 20 min  Stress: No Stress Concern Present (08/28/2022)   [employer] of Occupational Health - Occupational Stress Questionnaire    Feeling of Stress : Not at all  Social Connections: Unknown (08/28/2022)   Social Connection and Isolation Panel [NHANES]    Frequency of Communication with Friends and Family: Three times a week    Frequency of Social Gatherings with Friends and Family: Once a week    Attends Religious Services: Not on Marketing executive or Organizations: No    Attends Tax inspector Meetings: Never    Marital Status: Married    Tobacco Counseling Counseling given: Not Answered Tobacco comments: never used tobacco   Clinical Intake:  Pre-visit preparation completed: Yes  Pain : No/denies pain  Nutritional Risks: None Diabetes: Yes CBG done?: No Did pt. bring in CBG monitor from home?: No  How often do you need to have someone help you when you read instructions, pamphlets, or other written materials from your doctor or pharmacy?: 1 - Never   Activities of Daily Living    08/28/2022    1:21 PM  In your present state of health, do you have any difficulty performing the following activities:  Hearing? 0  Vision? 0  Difficulty concentrating or making decisions? 0  Walking or climbing stairs? 0  Dressing or bathing? 0  Doing errands, shopping? 0  Preparing Food and eating ? N  Using the Toilet? N  In the past six months, have you accidently leaked urine? N  Do you have problems with loss of bowel control? N  Managing your Medications? N  Managing your Finances? N  Housekeeping or managing your Housekeeping? N    Patient Care Team: Wanda Plump, MD as PCP - General (Internal Medicine) Myna Hidalgo Rose Phi, MD as Consulting Physician (Oncology) Iva Boop, MD as Consulting Physician (Gastroenterology) Jene Every, MD as Consulting Physician (Orthopedic Surgery) Mateo Flow, MD as Consulting Physician (Ophthalmology) Salvatore Marvel, MD as Consulting Physician (Orthopedic Surgery)  Indicate any recent Medical Services you may have received from other than  [facility] providers in the past year (date may be approximate).     Assessment:   This is a routine wellness examination for Jennifer [name].  Hearing/Vision screen No results found.  Dietary issues and exercise activities discussed: Current Exercise Habits: The patient does not participate in regular exercise at present (uses Cub[employer] sometimes), Exercise limited by: None identified   Goals  Addressed   None    Depression Screen    08/28/2022    2:26 PM 06/05/2022    1:01 PM 12/30/2021    1:48 PM 11/11/2021    1:11 PM 08/25/2021    3:11 PM 05/19/2021    8:27 AM 04/26/2021    2:16 PM  PHQ 2/9 Scores  PHQ - 2 Score 1 0 0 0 0 1 0  PHQ- 9 Score      1     Fall Risk    08/28/2022    1:21 PM 06/05/2022    1:01 PM 12/30/2021    1:48 PM 11/11/2021    1:11 PM 08/25/2021    3:14 PM  Fall Risk   Falls in the past year? 0 0 0 0 0  Number falls in past yr: 0 0 0 0 0  Injury with Fall? 0 0 0 0 0  Risk for fall due to : No Fall Risks    No Fall Risks  Follow up Falls evaluation completed Falls evaluation completed Falls evaluation completed Falls evaluation completed     FALL RISK PREVENTION PERTAINING TO THE HOME:  Any stairs in or around the home? Yes  If so, are there any without handrails? Yes  Home free of loose throw rugs in walkways, pet beds, electrical cords, etc? Yes  Adequate lighting in your home to reduce risk of falls? Yes   ASSISTIVE DEVICES UTILIZED TO PREVENT FALLS:  Life alert? No  Use of a cane, walker or w/c? No  Grab bars in the bathroom? No  Shower chair or bench in shower? No  Elevated toilet seat or a handicapped toilet? No   TIMED UP AND GO:  Was the test performed?  No, audio visit .    Cognitive Function:        08/28/2022    2:34 PM 08/25/2021    3:16 PM  6CIT Screen  What Year? 0 points 0 points  What month? 0 points 0 points  What time? 0 points 0 points  Count back from 20 0 points 0 points  Months in reverse 0 points 0 points  Repeat phrase 0 points 0 points  Total Score 0 points 0 points    Immunizations Immunization History  Administered Date(s) Administered   Fluad Quad(high Dose 65+) 12/06/2018, 12/30/2021   Influenza Split 01/17/2011, 01/08/2012   Influenza Whole 01/15/2008, 01/21/2009, 01/25/2010   Influenza, High Dose Seasonal PF 01/28/2018   Influenza,inj,Quad PF,6+ Mos 12/26/2012, 01/07/2014, 01/04/2015, 01/11/2016,  02/16/2017   Influenza,inj,quad, With Preservative 01/22/2017   Influenza-Unspecified 12/06/2018, 01/08/2020, 01/06/2021   PFIZER(Purple Top)SARS-COV-2 Vaccination 05/15/2019, 06/06/2019, 01/19/2020, 07/30/2020   Pfizer Covid-19 Vaccine Bivalent Booster 50yrs & up 01/26/2021, 01/26/2022   Pneumococcal Conjugate-13 11/19/2015, 12/06/2018   Pneumococcal Polysaccharide-23 04/29/2014   RSV,unspecified 06/05/2022   Tdap 08/23/2012, 07/07/2022   Zoster Recombinat (Shingrix) 05/23/2018, 10/30/2018   Zoster, Live 10/03/2012    TDAP status: Up to date  Flu Vaccine status: Up to date  Pneumococcal vaccine status: Up to date  Covid-19 vaccine status: Information provided on how to obtain vaccines.   Qualifies for Shingles Vaccine? Yes  Zostavax completed Yes   Shingrix Completed?: Yes  Screening Tests Health Maintenance  Topic Date Due   FOOT EXAM  03/04/2021   Medicare Annual Wellness (AWV)  08/26/2022   COVID-19 Vaccine (7 - 2023-24 season) 12/01/2022 (Originally 03/23/2022)   INFLUENZA VACCINE  11/23/2022   HEMOGLOBIN A1C  12/04/2022   MAMMOGRAM  04/22/2023   Diabetic kidney evaluation - eGFR measurement  06/06/2023   Diabetic kidney evaluation - Urine ACR  06/06/2023   OPHTHALMOLOGY EXAM  06/29/2023   Pneumonia Vaccine 51+ Years old (3 of 3 - PPSV23 or PCV20) 12/06/2023   COLONOSCOPY (Pts 45-28yrs Insurance coverage will need to be confirmed)  01/18/2028   DTaP/Tdap/Td (3 - Td or Tdap) 07/06/2032   DEXA SCAN  Completed   Hepatitis C Screening  Completed   Zoster Vaccines- Shingrix  Completed   HPV VACCINES  Aged Out    Health Maintenance  Health Maintenance Due  Topic Date Due   FOOT EXAM  03/04/2021   Medicare Annual Wellness (AWV)  08/26/2022    Colorectal cancer screening: Type of screening: Colonoscopy. Completed 01/17/21. Repeat every 7 years  Mammogram status: Completed 04/21/22. Repeat every year  Bone Density status: Completed 05/07/17. Results reflect: Bone  density results: NORMAL. Repeat every 2 years.  Lung Cancer Screening: (Low Dose CT Chest recommended if Age 49-80 years, 30 pack-year currently smoking OR have quit w/in 15years.) does not qualify.   Additional Screening:  Hepatitis C Screening: does qualify; Completed 03/08/15  Vision Screening: Recommended annual ophthalmology exams for early detection of glaucoma and other disorders of the eye. Is the patient up to date with their annual eye exam?  Yes  Who is the provider or what is the name of the office in which the patient attends annual eye exams? [facility] If pt is not established with a provider, would they like to be referred to a provider to establish care? No .   Dental Screening: Recommended annual dental exams for proper oral hygiene  Community Resource Referral / Chronic Care Management: CRR required this visit?  No   CCM required this visit?  No      Plan:     I have personally reviewed and noted the following in the patient's chart:   Medical and social history Use of alcohol, tobacco or illicit drugs  Current medications and supplements including opioid prescriptions. Patient is not currently taking opioid prescriptions. Functional ability and status Nutritional status Physical activity Advanced directives List of other physicians Hospitalizations, surgeries, and ER visits in previous 12 months Vitals Screenings to include cognitive, depression, and falls Referrals and appointments  In addition, I have reviewed and discussed with patient certain preventive protocols, quality metrics, and best practice recommendations. A written personalized care plan for preventive services as well as general preventive health recommendations were provided to patient.   Due to this being a telephonic visit, the after visit summary with patients personalized plan was offered to patient via mail or my-chart. Patient would like to access on my-chart.  Jennifer [name], [place]   08/28/2022   Nurse Notes: None

## 2022-08-30 ENCOUNTER — Other Ambulatory Visit: Payer: Self-pay | Admitting: Hematology & Oncology

## 2022-09-01 NOTE — Telephone Encounter (Signed)
Dr Lavon Paganini Please advise, Patient is on Xarelto Ennever said three day hold for Eliquis

## 2022-09-01 NOTE — Telephone Encounter (Signed)
Please advise on patients Xarelto she has a procedure on the 14th. Thank You I routed this to you on the 29th not sure if you seen it. Thank You

## 2022-09-01 NOTE — Telephone Encounter (Signed)
Stop Eliquis 3 days prior to the procedure.  Re-start day after.  Cindee Lame

## 2022-09-01 NOTE — Telephone Encounter (Signed)
Jennifer [name], please advise patient to hold Xarelto for 2 days prior to the procedure.  Dr. Myna Hidalgo, I am CCing you to be sure that it was a typing error in your note to hold Eliquis.  Thank you

## 2022-09-04 ENCOUNTER — Ambulatory Visit: Payer: Medicare Other | Admitting: Internal Medicine

## 2022-09-04 NOTE — Telephone Encounter (Signed)
Patient already advised to hold 2 days before procedure

## 2022-09-05 ENCOUNTER — Encounter: Payer: Self-pay | Admitting: Gastroenterology

## 2022-09-05 ENCOUNTER — Ambulatory Visit (AMBULATORY_SURGERY_CENTER): Payer: Medicare Other | Admitting: Gastroenterology

## 2022-09-05 VITALS — BP 146/83 | HR 64 | Temp 97.5°F | Resp 15 | Ht 68.0 in | Wt 257.0 lb

## 2022-09-05 DIAGNOSIS — B3781 Candidal esophagitis: Secondary | ICD-10-CM

## 2022-09-05 DIAGNOSIS — K2970 Gastritis, unspecified, without bleeding: Secondary | ICD-10-CM | POA: Diagnosis not present

## 2022-09-05 DIAGNOSIS — K222 Esophageal obstruction: Secondary | ICD-10-CM

## 2022-09-05 DIAGNOSIS — R1310 Dysphagia, unspecified: Secondary | ICD-10-CM

## 2022-09-05 MED ORDER — SODIUM CHLORIDE 0.9 % IV SOLN: 500.0000 mL | INTRAVENOUS | Status: DC

## 2022-09-05 MED ORDER — ONDANSETRON HCL 40 MG/20ML IJ SOLN
4.0000 mg | Freq: Once | INTRAMUSCULAR | Status: AC
Start: 2022-09-05 — End: 2022-09-05
  Administered 2022-09-05: 4 mg via INTRAVENOUS

## 2022-09-05 MED ORDER — FLUCONAZOLE 100 MG PO TABS: 100.0000 mg | ORAL_TABLET | Freq: Every day | ORAL | 0 refills | Status: AC

## 2022-09-05 NOTE — Progress Notes (Signed)
Called to room to assist during endoscopic procedure.  Patient ID and intended procedure confirmed with present staff. Received instructions for my participation in the procedure from the performing physician.  

## 2022-09-05 NOTE — Op Note (Addendum)
[facility] Patient Name: Jennifer [name] Procedure Date: 09/05/2022 10:55 AM MRN: [id number] Endoscopist: Napoleon Form , MD, [id number] Age: 72 Referring MD:  Date of Birth: 1950-07-10 Gender: Female Account #: [id number] Procedure:                Upper GI endoscopy Indications:              Dysphagia, Heartburn, Unexplained chest pain, Chest                            pain (non cardiac) Medicines:                Monitored Anesthesia Care Procedure:                Pre-Anesthesia Assessment:                           - Prior to the procedure, a History and Physical                            was performed, and patient medications and                            allergies were reviewed. The patient's tolerance of                            previous anesthesia was also reviewed. The risks                            and benefits of the procedure and the sedation                            options and risks were discussed with the patient.                            All questions were answered, and informed consent                            was obtained. Prior Anticoagulants: The patient                            last took Xarelto (rivaroxaban) 2 days prior to the                            procedure. ASA Grade Assessment: III - A patient                            with severe systemic disease. After reviewing the                            risks and benefits, the patient was deemed in                            satisfactory condition to undergo the procedure.  After obtaining informed consent, the endoscope was                            passed under direct vision. Throughout the                            procedure, the patient's blood pressure, pulse, and                            oxygen saturations were monitored continuously. The                            Olympus scope [id number] was introduced through the                            mouth, and  advanced to the second part of duodenum.                            The upper GI endoscopy was accomplished without                            difficulty. The patient tolerated the procedure                            well. Scope In: Scope Out: Findings:                 LA Grade C (one or more mucosal breaks continuous                            between tops of 2 or more mucosal folds, less than                            75% circumference) esophagitis with no bleeding was                            found 32 to 38 cm from the incisors. Biopsies were                            taken with a cold forceps for histology.                           One benign-appearing, intrinsic mild stenosis was                            found 38 to 39 cm from the incisors. The stenosis                            was traversed. A TTS dilator was passed through the                            scope. Dilation with an 18-19-20 mm balloon dilator  was performed to 20 mm. The dilation site was                            examined following endoscope reinsertion and showed                            mild mucosal disruption.                           Evidence of a Nissen fundoplication was found in                            the cardia. The wrap appeared intact. This was                            traversed.                           The stomach was otherwise normal.                           The cardia and gastric fundus were normal on                            retroflexion.                           The examined duodenum was normal. Complications:            No immediate complications. Estimated Blood Loss:     Estimated blood loss was minimal. Impression:               - LA Grade C candidiasis esophagitis with no                            bleeding. Biopsied.                           - Benign-appearing esophageal stenosis. Dilated.                           - A Nissen fundoplication  was found. The wrap                            appears intact.                           - Normal stomach.                           - Normal examined duodenum. Recommendation:           - Resume previous diet.                           - Continue present medications.                           - Await pathology  results.                           - Diflucan (fluconazole) 100 mg PO daily for 3                            weeks.                           - Return to GI office at the next available                            appointment in 3-4 months.                           - Resume Xarelto (rivaroxaban) at prior dose in 2                            days. Refer to managing physician for further                            adjustment of therapy. Napoleon Form, MD 09/05/2022 11:24:16 AM This report has been signed electronically.

## 2022-09-05 NOTE — Progress Notes (Signed)
A and O x3. Report to RN. Tolerated MAC anesthesia well.Teeth unchanged after procedure. 

## 2022-09-05 NOTE — Patient Instructions (Addendum)
Resume previous diet, start with liquids, avoid dairy products with the yeast infection and mucous, when mucous subsides, return to dairy products as tolerated Continue present medications, except RESUME XARELTO IN TWO DAYS, THURSDAY ON 09/07/22, AT PREVIOUS DOSE  BEGIN TAKING DIFLUCAN 100 MG DAILY FOR 3 WEEKS FOR THE ESOPHAGITIS (YEAST) Await pathology results RETURN TO GI OFFICE IN 3-4 MONTHS, APPOINTMENT IS MADE, PLEASE CALL TO RESCHEDULE IF NEEDED. Handouts/information given for esophageal dilation, esophageal stricture  YOU HAD AN ENDOSCOPIC PROCEDURE TODAY AT THE [facility]:   Refer to the procedure report that was given to you for any specific questions about what was found during the examination.  If the procedure report does not answer your questions, please call your gastroenterologist to clarify.  If you requested that your care partner not be given the details of your procedure findings, then the procedure report has been included in a sealed envelope for you to review at your convenience later.  YOU SHOULD EXPECT: Some feelings of bloating in the abdomen. Passage of more gas than usual.  Walking can help get rid of the air that was put into your GI tract during the procedure and reduce the bloating. If you had a lower endoscopy (such as a colonoscopy or flexible sigmoidoscopy) you may notice spotting of blood in your stool or on the toilet paper. If you underwent a bowel prep for your procedure, you may not have a normal bowel movement for a few days.  Please Note:  You might notice some irritation and congestion in your nose or some drainage.  This is from the oxygen used during your procedure.  There is no need for concern and it should clear up in a day or so.  SYMPTOMS TO REPORT IMMEDIATELY:  Following upper endoscopy (EGD)  Vomiting of blood or coffee ground material  New chest pain or pain under the shoulder blades  Painful or persistently difficult swallowing  New  shortness of breath  Fever of 100F or higher  Black, tarry-looking stools  For urgent or emergent issues, a gastroenterologist can be reached at any hour by calling [phone]. Do not use MyChart messaging for urgent concerns.   DIET:  We do recommend a small meal at first, but then you may proceed to your regular diet.  Drink plenty of fluids but you should avoid alcoholic beverages for 24 hours.  ACTIVITY:  You should plan to take it easy for the rest of today and you should NOT DRIVE or use heavy machinery until tomorrow (because of the sedation medicines used during the test).    FOLLOW UP: Our staff will call the number listed on your records the next business day following your procedure.  We will call around 7:15- 8:00 am to check on you and address any questions or concerns that you may have regarding the information given to you following your procedure. If we do not reach you, we will leave a message.     If any biopsies were taken you will be contacted by phone or by letter within the next 1-3 weeks.  Please call us at [phone] if you have not heard about the biopsies in 3 weeks.    SIGNATURES/CONFIDENTIALITY: You and/or your care partner have signed paperwork which will be entered into your electronic medical record.  These signatures attest to the fact that that the information above on your After Visit Summary has been reviewed and is understood.  Full responsibility of the confidentiality of  this discharge information lies with you and/or your care-partner.

## 2022-09-05 NOTE — Progress Notes (Signed)
Please refer to office visit note 08/17/22. No additional changes in H&P Patient is appropriate for planned procedure(s) and anesthesia in an ambulatory setting  K. Scherry Ran , MD [phone]

## 2022-09-06 ENCOUNTER — Telehealth: Payer: Self-pay

## 2022-09-06 NOTE — Telephone Encounter (Signed)
Left message on answering machine. 

## 2022-09-16 ENCOUNTER — Encounter: Payer: Self-pay | Admitting: Gastroenterology

## 2022-09-19 ENCOUNTER — Telehealth: Payer: Self-pay | Admitting: Gastroenterology

## 2022-09-19 NOTE — Telephone Encounter (Signed)
See 09/05/22 surgical pathology report for further correspondence.

## 2022-09-19 NOTE — Progress Notes (Signed)
L/m 5/28

## 2022-09-19 NOTE — Telephone Encounter (Signed)
Patient is returning your call.  

## 2022-09-22 ENCOUNTER — Other Ambulatory Visit: Payer: Self-pay | Admitting: Gastroenterology

## 2022-09-26 DIAGNOSIS — S52502A Unspecified fracture of the lower end of left radius, initial encounter for closed fracture: Secondary | ICD-10-CM | POA: Diagnosis not present

## 2022-10-01 ENCOUNTER — Other Ambulatory Visit: Payer: Self-pay | Admitting: Hematology & Oncology

## 2022-10-02 ENCOUNTER — Encounter: Payer: Self-pay | Admitting: Family

## 2022-10-04 ENCOUNTER — Encounter: Payer: Self-pay | Admitting: Internal Medicine

## 2022-10-04 ENCOUNTER — Ambulatory Visit (INDEPENDENT_AMBULATORY_CARE_PROVIDER_SITE_OTHER): Payer: Medicare Other | Admitting: Internal Medicine

## 2022-10-04 VITALS — BP 134/72 | HR 77 | Temp 97.8°F | Resp 18 | Ht 68.5 in | Wt 256.4 lb

## 2022-10-04 DIAGNOSIS — M79605 Pain in left leg: Secondary | ICD-10-CM | POA: Diagnosis not present

## 2022-10-04 DIAGNOSIS — F419 Anxiety disorder, unspecified: Secondary | ICD-10-CM | POA: Diagnosis not present

## 2022-10-04 DIAGNOSIS — M79604 Pain in right leg: Secondary | ICD-10-CM

## 2022-10-04 DIAGNOSIS — N6489 Other specified disorders of breast: Secondary | ICD-10-CM

## 2022-10-04 NOTE — Patient Instructions (Addendum)
Take fluoxetine 10 mg once daily for the next 2 weeks then you can stop.  Continue doing your self breast exam (see information)  Use the compression stockings on your leg consistently.      GO TO THE FRONT DESK, PLEASE SCHEDULE YOUR APPOINTMENTS Come back for a checkup in 4 months
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
## 2022-10-31 ENCOUNTER — Other Ambulatory Visit: Payer: Self-pay | Admitting: Hematology & Oncology

## 2022-11-01 ENCOUNTER — Encounter: Payer: Self-pay | Admitting: Family

## 2022-11-16 ENCOUNTER — Telehealth: Payer: Self-pay | Admitting: Internal Medicine

## 2022-11-16 NOTE — Telephone Encounter (Signed)
Requesting: temazepam 30mg   Contract:06/05/22 UDS:06/05/22 Last Visit: 10/04/22 Next Visit: 02/05/23 Last Refill: 07/14/22 #30 and 3RF  Please Advise

## 2022-11-16 NOTE — Telephone Encounter (Signed)
PDMP okay, Rx sent 

## 2022-11-24 ENCOUNTER — Other Ambulatory Visit: Payer: Self-pay | Admitting: Hematology & Oncology

## 2022-11-25 ENCOUNTER — Encounter: Payer: Self-pay | Admitting: Family

## 2022-12-04 ENCOUNTER — Inpatient Hospital Stay: Payer: Medicare Other | Admitting: Hematology & Oncology

## 2022-12-04 ENCOUNTER — Inpatient Hospital Stay: Payer: Medicare Other

## 2022-12-06 ENCOUNTER — Ambulatory Visit: Payer: Medicare Other | Admitting: Gastroenterology

## 2022-12-06 ENCOUNTER — Ambulatory Visit (INDEPENDENT_AMBULATORY_CARE_PROVIDER_SITE_OTHER): Payer: Medicare Other | Admitting: Gastroenterology

## 2022-12-06 ENCOUNTER — Encounter: Payer: Self-pay | Admitting: Gastroenterology

## 2022-12-06 VITALS — BP 132/76 | HR 74 | Ht 68.5 in | Wt 259.0 lb

## 2022-12-06 DIAGNOSIS — K209 Esophagitis, unspecified without bleeding: Secondary | ICD-10-CM | POA: Diagnosis not present

## 2022-12-06 DIAGNOSIS — K9189 Other postprocedural complications and disorders of digestive system: Secondary | ICD-10-CM

## 2022-12-06 DIAGNOSIS — R9389 Abnormal findings on diagnostic imaging of other specified body structures: Secondary | ICD-10-CM

## 2022-12-06 DIAGNOSIS — R1319 Other dysphagia: Secondary | ICD-10-CM

## 2022-12-06 NOTE — Patient Instructions (Signed)
You will be contacted by [facility] Scheduling in the next 2 days to arrange a CT Chest.  The number on your caller ID will be [phone], please answer when they call.  If you have not heard from them in 2 days please call [phone] to schedule.     We will refer you to Dr Cliffton Asters and they will contact you with an appointment  Due to recent changes in healthcare laws, you may see the results of your imaging and laboratory studies on MyChart before your provider has had a chance to review them.  We understand that in some cases there may be results that are confusing or concerning to you. Not all laboratory results come back in the same time frame and the provider may be waiting for multiple results in order to interpret others.  Please give [place] 48 hours in order for your provider to thoroughly review all the results before contacting the office for clarification of your results.    _______________________________________________________  If your blood pressure at your visit was 140/90 or greater, please contact your primary care physician to follow up on this.  _______________________________________________________  If you are age 58 or older, your body mass index should be between 23-30. Your Body mass index is 38.81 kg/m. If this is out of the aforementioned range listed, please consider follow up with your Primary Care Provider.  If you are age 42 or younger, your body mass index should be between 19-25. Your Body mass index is 38.81 kg/m. If this is out of the aformentioned range listed, please consider follow up with your Primary Care Provider.   ________________________________________________________  The [facility] providers would like to encourage you to use [facility] to communicate with providers for non-urgent requests or questions.  Due to long hold times on the telephone, sending your provider a message by [facility] may be a faster and more efficient way to get a  response.  Please allow 48 business hours for a response.  Please remember that this is for non-urgent requests.  _______________________________________________________   I appreciate the  opportunity to care for you  Thank You   Marsa Aris , MD

## 2022-12-06 NOTE — Progress Notes (Signed)
Jennifer [name]    [id number]    11-16-1950  Primary Care Physician:Paz, Nolon Rod, MD  Referring Physician: Wanda Plump, MD [address] [place],  [place] [address]   Chief complaint: Dysphagia  HPI:  72 yr old very pleasant female here for follow-up visit for GERD and dysphagia.She has history of chronic dysphagia, s/p Nissen fundoplication X3. She is on chronic anticoagulation for DVT and PE. Status post EGD 09/05/22 - LA Grade C candidiasis esophagitis with no bleeding. Biopsied. - Benign-appearing esophageal stenosis. Dilated. - A Nissen fundoplication was found. The wrap  appears intact. - Normal stomach.  Normal examined duodenum.  No significant change in symptoms status post EGD with dilation, she has to cut up food really small and chews very well, drinks warm water and tea along with her meals to help swallowing.   Denies diarrhea, nausea, blood in stool, black stool, vomiting, bloating, unintentional weight loss.     GI Hx: CT ANGIO ABDOMEN PELVIS  W &/OR WO CONTRAST 07-14-22 VASCULAR 1. Mild compression on the left common iliac vein from the overlying iliac arteries. This can be a normal anatomic configuration. Based on the mild compression and the lack of significant venous collaterals, May-Thurner syndrome is unlikely. However, if there is high clinical concern, this could be further characterized with catheter directed venography and IVUS. Main pelvic veins are patent. 2. Arterial structures in the abdomen and pelvis are widely patent. NON-VASCULAR 1. No acute abnormality in the abdomen or pelvis. 2. Stable postsurgical changes. Mild intrahepatic and extrahepatic biliary dilatation has minimally changed and likely related to previous cholecystectomy. 3. Small ventral hernias containing fat. 4. Small hypodensities in the kidneys are too small to definitively characterize. These do not require dedicated follow-up.   EGD 05/02/2021 - LA  Grade C esophagitis with no bleeding. Biopsied. - Esophageal mucosal changes suspicious for short-segment Barrett's esophagus. Biopsied. - A Nissen fundoplication was found. The wrap appears intact. - Gastritis. Biopsied. - Normal examined duodenum.   EGD 04-15-20 - Esophageal stenosis. Unable to dilate. Dilated. - Esophageal mucosal changes suspicious for short-segment Barrett's esophagus. Biopsied.  - A Nissen fundoplication was found. The wrap appears intact.  - Gastritis. Biopsied.  - Normal examined duodenum. 1. Surgical [P], gastric antrum, gastric body - GASTRIC ANTRAL MUCOSA WITH MILD REACTIVE GASTROPATHY. - GASTRIC OXYNTIC MUCOSA WITH MILD CHRONIC GASTRITIS. - INTESTINAL METAPLASIA IS PRESENT FOCALLY IN ANTRUM, NEGATIVE FOR DYSPLASIA. - WARTHIN-STARRY STAIN IS NEGATIVE FOR HELICOBACTER PYLORI. 2. Surgical [P], esophagus, GE junction - SQUAMOCOLUMNAR ESOPHAGEAL MUCOSA WITH REACTIVE/REGENERATIVE CHANGE. - NEGATIVE FOR INTESTINAL METAPLASIA (GOBLET CELL METAPLASIA).   Esophageal manometry August 06, 2019: Findings suggestive of EG junction outflow obstruction with intact peristalsis, negative for achalasia.   EGD September 28, 2016: It is post Nissen fundoplication with intact wrap, dilated with TTS balloon to 18 mm   Colonoscopy January 17, 2021 - One 4 mm polyp in the transverse colon, removed with a cold snare. Resected and retrieved. Clip (MR conditional) was placed. - Mild diverticulosis in the sigmoid colon, in the descending colon, in the transverse colon and in the ascending colon. - Non-bleeding external and internal hemorrhoids. - Anal fissure.   Colonoscopy December 14, 2015: She had removal of small sessile polyps, one SSP and one tubular adenoma.  Due for recall colonoscopy August 2022     Outpatient Encounter Medications as of 12/06/2022  Medication Sig   acetaminophen (TYLENOL) 500  MG tablet Take 1 tablet (500 mg total) by mouth every 6 (six) hours as needed.    ACIPHEX 20 MG tablet Take 1 tablet (20 mg total) by mouth 2 (two) times daily.   Apoaequorin (PREVAGEN) 10 MG CAPS Take 10 mg by mouth daily. prevagen   cycloSPORINE (RESTASIS) 0.05 % ophthalmic emulsion Restasis 0.05 % eye drops in a dropperette  INSTILL 1 DROP INTO EACH EYE TWICE DAILY   hydrocortisone valerate cream (WESTCORT) 0.2 % Apply topically.   hyoscyamine (LEVSIN SL) 0.125 MG SL tablet DISSOLVE ONE TABLET UNDER TONGUE EVERY FOUR HOURS AS NEEDED   lidocaine (LIDODERM) 5 % 1 patch daily.   Multiple Vitamins-Minerals (MULTIVITAMIN WITH MINERALS) tablet Take 1 tablet by mouth daily. Rainbow light   rosuvastatin (CRESTOR) 10 MG tablet Take 1 tablet (10 mg total) by mouth at bedtime.   temazepam (RESTORIL) 30 MG capsule TAKE ONE CAPSULE BY MOUTH AT BEDTIME AS NEEDED FOR SLEEP   triamterene-hydrochlorothiazide (MAXZIDE-25) 37.5-25 MG tablet Take 0.5 tablets by mouth daily.   XARELTO 2.5 MG TABS tablet TAKE TWO TABLETS BY MOUTH DAILY   [DISCONTINUED] AMBULATORY NON FORMULARY MEDICATION Medication Name: Nitroglycerin ointment use pea sized amount per rectum three times a day for 8 weeks   [DISCONTINUED] FLUoxetine (PROZAC) 10 MG capsule Take 1 capsule (10 mg total) by mouth daily.   No facility-administered encounter medications on file as of 12/06/2022.    Allergies as of 12/06/2022 - Review Complete 12/06/2022  Allergen Reaction Noted   Morphine and codeine Anaphylaxis and Shortness Of Breath 12/01/2013   Sulfonamide derivatives Hives 12/02/2018   Promethazine hcl Other (See Comments) 12/02/2018    Past Medical History:  Diagnosis Date   Allergy    Anemia    Arthritis    Cataract    bil cataracts removed   Chronic chest pain    Chronic fatigue 02/03/2017   Clotting disorder [facility])    Pulmonary Embolis 2010 & 2015   Colon polyps    inflamed partially ulcerated hyperplastic polyp   Diabetes (HCC) 09/24/2013   no meds   Diverticulosis    Dysphagia esophageal phase - chronic  after 3 fundoplications 01/20/2014   Esophageal stenosis    Stricture after fundoplication REDO   Fibromyalgia    GERD (gastroesophageal reflux disease)    Glaucoma suspect    Heart murmur    no problems    Hemorrhoids, internal, with bleeding 06/27/2017   History of hiatal hernia    Hypertension    IBS (irritable bowel syndrome)    Lactose intolerance    Mitral valve prolapse    OSA (obstructive sleep apnea)    on CPAP-Mild - Sleep study 2004   Pre-diabetes    Pulmonary embolus (HCC)    2010 and 12-2013   Sleep apnea    wears CPAP   Thyroid nodule      dr. watching    Tinnitus    Subjective   Zoster 2014   R flank    Past Surgical History:  Procedure Laterality Date   ABDOMINAL HYSTERECTOMY     still has 1 ovary    APPENDECTOMY     BRAIN SURGERY  1995   Stem surgery, Arnold Chiari Malformation   CATARACT EXTRACTION Bilateral 2017   cataracts, Dr. Elmer Picker   CHOLECYSTECTOMY     COLONOSCOPY     ESOPHAGEAL MANOMETRY N/A 08/06/2019   Procedure: ESOPHAGEAL MANOMETRY (EM);  Surgeon: Iva Boop, MD;  Location: [facility] ENDOSCOPY;  Service: Endoscopy;  Laterality:  N/A;   HEMORRHOID BANDING     HERNIA REPAIR     Hital Hernia   KNEE ARTHROSCOPY  05/16/2012   Procedure: ARTHROSCOPY KNEE;  Surgeon: Kennieth Rad, MD;  Location[facility];  Service: Orthopedics;  Laterality: Left;   KNEE ARTHROSCOPY Right 01/2018   NISSEN FUNDOPLICATION     X 3 lab, open x 2 last with mesh   POLYPECTOMY     REDUCTION MAMMAPLASTY Bilateral 2007   TOTAL KNEE ARTHROPLASTY Right 08/25/2019   Procedure: RIGHT TOTAL KNEE ARTHROPLASTY;  Surgeon: Ollen Gross, MD;  Location: [facility] ORS;  Service: Orthopedics;  Laterality: Right;    UPPER GASTROINTESTINAL ENDOSCOPY      Family History  Problem Relation Age of Onset   Diabetes Mother    Rheum arthritis Mother    Hypertension Mother    Hypertension Father    Alcoholism Father    Rheum arthritis Sister    Hypertension Sister    Arthritis  Sister    Rheum arthritis Sister    Rheum arthritis Sister    CAD Sister        MI age 21   Fibromyalgia Sister    Colon cancer Maternal Grandmother        dx with colon ca laster in life - lived to be 104   Crohn's disease Maternal Aunt    Stomach cancer Maternal Uncle    Lung cancer Maternal Uncle    Breast cancer Neg Hx    Esophageal cancer Neg Hx    Rectal cancer Neg Hx    Prostate cancer Neg Hx    Pancreatic cancer Neg Hx    Colon polyps Neg Hx     Social History   Socioeconomic History   Marital status: Married    Spouse name: Not on file   Number of children: 1   Years of education: Not on file   Highest education level: Associate degree: occupational, Scientist, product/process development, or vocational program  Occupational History   Occupation: retired form the [employer] 04-2015    Employer: [employer]  Tobacco Use   Smoking status: Never   Smokeless tobacco: Never   Tobacco comments:    never used tobacco  Vaping Use   Vaping status: Never Used  Substance and Sexual Activity   Alcohol use: No    Alcohol/week: 0.0 standard drinks of alcohol   Drug use: No   Sexual activity: Not Currently    Birth control/protection: None  Other Topics Concern   Not on file  Social History Narrative   [employer] Graduate - Technical school   Married '70 for 2 years, divorced; re-married '86 for 2.5 years, divorced; re-married '90 for 1 year, divorced; re-married '00   1 son - '60   Sister - Died @ 38 [place]       Social Determinants of Corporate investment banker Strain: Low Risk  (10/03/2022)   Overall Financial Resource Strain (CARDIA)    Difficulty of Paying Living Expenses: Not hard at all  Food Insecurity: No Food Insecurity (10/03/2022)   Hunger Vital Sign    Worried About Running Out of Food in the Last Year: Never true    Ran Out of Food in the Last Year: Never true  Transportation Needs: No Transportation Needs (10/03/2022)   PRAPARE - Administrator, Civil Service  (Medical): No    Lack of Transportation (Non-Medical): No  Physical Activity: Insufficiently Active (10/03/2022)   Exercise Vital Sign  Days of Exercise per Week: 1 day    Minutes of Exercise per Session: 20 min  Stress: No Stress Concern Present (10/03/2022)   [employer] of Occupational Health - Occupational Stress Questionnaire    Feeling of Stress : Not at all  Social Connections: Moderately Integrated (10/03/2022)   Social Connection and Isolation Panel [NHANES]    Frequency of Communication with Friends and Family: More than three times a week    Frequency of Social Gatherings with Friends and Family: Once a week    Attends Religious Services: More than 4 times per year    Active Member of [employer] or Organizations: No    Attends Banker Meetings: Never    Marital Status: Married  Catering manager Violence: Not At Risk (08/28/2022)   Humiliation, Afraid, Rape, and Kick questionnaire    Fear of Current or Ex-Partner: No    Emotionally Abused: No    Physically Abused: No    Sexually Abused: No      Review of systems: All other review of systems negative except as mentioned in the HPI.   Physical Exam: Vitals:   12/06/22 0917  BP: 132/76  Pulse: 74   Body mass index is 38.81 kg/m. Gen:      No acute distress HEENT:  sclera anicteric Abd:      soft, non-tender; no palpable masses, no distension Ext:    No edema Neuro: alert and oriented x 3 Psych: normal mood and affect  Data Reviewed:  Reviewed labs, radiology imaging, old records and pertinent past GI work up   Assessment and Plan/Recommendations:  72 year old very pleasant female with history of chronic GERD, s/p Nissen fundoplication, history of adenomatous colon polyps, history of DVT and PE on chronic anticoagulation [Xarelto] with complaints of solid dysphagia, associated with retrosternal discomfort and regurgitation  She has lower esophageal stasis likely secondary to tight Nissen  fundoplication, has increased LES pressure consistent with esophageal gastric  junction outlet obstruction, has intact peristalsis, she had mesh hiatal hernia repair and redo Nissen fundoplication in 2003.  She has had multiple esophageal dilations since then here and also previously at [facility], no significant improvement  Will refer to Dr. Cliffton Asters to evaluate for possible POEM to relieve EGJ outflow obstruction or redo Nissen fundoplication Will obtain CT chest with oral contrast   Return in 2 to 3 months.  This visit required 40 minutes of patient care (this includes precharting, chart review, review of results, face-to-face time used for counseling as well as treatment plan and follow-up. The patient was provided an opportunity to ask questions and all were answered. The patient agreed with the plan and demonstrated an understanding of the instructions.  Iona Beard , MD    CC: Wanda Plump, MD

## 2022-12-11 ENCOUNTER — Ambulatory Visit: Payer: Medicare Other | Admitting: Gastroenterology

## 2022-12-12 ENCOUNTER — Ambulatory Visit (INDEPENDENT_AMBULATORY_CARE_PROVIDER_SITE_OTHER): Payer: Medicare Other | Admitting: Internal Medicine

## 2022-12-12 ENCOUNTER — Encounter (HOSPITAL_BASED_OUTPATIENT_CLINIC_OR_DEPARTMENT_OTHER): Payer: Self-pay | Admitting: Pulmonary Disease

## 2022-12-12 ENCOUNTER — Ambulatory Visit (INDEPENDENT_AMBULATORY_CARE_PROVIDER_SITE_OTHER): Payer: Medicare Other | Admitting: Pulmonary Disease

## 2022-12-12 ENCOUNTER — Encounter (INDEPENDENT_AMBULATORY_CARE_PROVIDER_SITE_OTHER): Payer: Self-pay | Admitting: Internal Medicine

## 2022-12-12 VITALS — BP 134/76 | HR 82 | Temp 98.2°F | Ht 68.0 in | Wt 256.0 lb

## 2022-12-12 VITALS — BP 130/82 | HR 84 | Ht 68.0 in | Wt 259.0 lb

## 2022-12-12 DIAGNOSIS — E559 Vitamin D deficiency, unspecified: Secondary | ICD-10-CM

## 2022-12-12 DIAGNOSIS — R0602 Shortness of breath: Secondary | ICD-10-CM

## 2022-12-12 DIAGNOSIS — Z1331 Encounter for screening for depression: Secondary | ICD-10-CM

## 2022-12-12 DIAGNOSIS — G4733 Obstructive sleep apnea (adult) (pediatric): Secondary | ICD-10-CM

## 2022-12-12 DIAGNOSIS — R5383 Other fatigue: Secondary | ICD-10-CM | POA: Diagnosis not present

## 2022-12-12 DIAGNOSIS — E1169 Type 2 diabetes mellitus with other specified complication: Secondary | ICD-10-CM | POA: Diagnosis not present

## 2022-12-12 DIAGNOSIS — Z6838 Body mass index (BMI) 38.0-38.9, adult: Secondary | ICD-10-CM

## 2022-12-12 DIAGNOSIS — Z723 Lack of physical exercise: Secondary | ICD-10-CM | POA: Diagnosis not present

## 2022-12-12 DIAGNOSIS — R944 Abnormal results of kidney function studies: Secondary | ICD-10-CM | POA: Diagnosis not present

## 2022-12-12 DIAGNOSIS — I10 Essential (primary) hypertension: Secondary | ICD-10-CM | POA: Diagnosis not present

## 2022-12-12 NOTE — Assessment & Plan Note (Signed)
BiPAP download was reviewed which shows increased central apneas and residual obstructive apneas with the residual AHI of 49/hour on 22/12 cm. She seems to have treatment emergent central apneas.  We will switch her to auto bilevel setting with EPAP minimum 10 cm pressure support +4 IPAP maximum 20 cm. Will also set her up for BiPAP titration study, she may need BiPAP ST. we briefly discussed hypoglossal nerve stimulator implant but she will need to lose weight and come to a BMI of 35 to qualify for this  Weight loss encouraged, compliance with goal of at least 4-6 hrs every night is the expectation. Advised against medications with sedative side effects Cautioned against driving when sleepy - understanding that sleepiness will vary on a day to day basis

## 2022-12-12 NOTE — Patient Instructions (Signed)
X BiPAP titration study  X Change to auto biPAP -EPAP min 8, PS +4, IPAP max 20

## 2022-12-12 NOTE — Assessment & Plan Note (Signed)
On CPAP with reported good compliance. Continue PAP therapy. Losing 15% or more of body weight may improve AHI.    

## 2022-12-12 NOTE — Assessment & Plan Note (Signed)
Patient has impaired mobility due to orthopedic problems.  We be discussed the benefits of exercise she may try low impact low intensity chair exercises.

## 2022-12-12 NOTE — Assessment & Plan Note (Signed)
Noted on recent labs no history of chronic kidney disease.  History of diet-controlled diabetes and hypertension.  This may be acute kidney injury due to use of Maxide and inadequate fluid intake.  We will check kidney function and electrolytes patient advised on improving hydration and avoiding nephrotoxins.

## 2022-12-12 NOTE — Assessment & Plan Note (Signed)
Check vitamin D levels today.  Continue supplementation adjust accordingly.  Goal level 50-60
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
## 2022-12-12 NOTE — Assessment & Plan Note (Signed)
Blood pressure close to goal.  She is currently on triamterene hydrochlorothiazide.  Recent labs showed a decrease in GFR.  We will check kidney function and electrolytes.  Patient counseled on maintaining adequate hydration.

## 2022-12-12 NOTE — Assessment & Plan Note (Signed)
HgbA1c is at goal for age and comorbid conditions. Denies symptoms of hypoglycemia or hyperglycemia.  She is not on medications.  Counseled on goals of care, monitoring for complications and importance of staying updated on immunizations and diabetes preventive measures. Continue with reduced calorie meal plan low on processed crabs and simple sugars. Ongoing weight loss will improve insulin resistance and glycemic control  Lab Results  Component Value Date   HGBA1C 6.3 06/05/2022   HGBA1C 6.2 10/17/2021   HGBA1C 6.3 04/26/2021   Lab Results  Component Value Date   MICROALBUR 0.8 06/05/2022   LDLCALC 51 06/05/2022   CREATININE 1.00 07/14/2022

## 2022-12-12 NOTE — Progress Notes (Signed)
Chief Complaint:   OBESITY Jennifer [name] (MR# [id number]) is a 72 y.o. female who presents for evaluation and treatment of obesity and related comorbidities. Current BMI is Body mass index is 38.92 kg/m. Jennifer [name] has been struggling with her weight for many years and has been unsuccessful in either losing weight, maintaining weight loss, or reaching her healthy weight goal.  Jennifer [name] is currently in the action stage of change and ready to dedicate time achieving and maintaining a healthier weight. Jennifer [name] is interested in becoming our patient and working on intensive lifestyle modifications including (but not limited to) diet and exercise for weight loss.  Jennifer [name]'s habits were reviewed today and are as follows: Her family eats meals together, she thinks her family will eat healthier with her, her desired weight loss is 46-86 lbs, she started gaining weight after her brain surgery and medications, her heaviest weight ever was 262 pounds, she has significant food cravings issues, she skips meals frequently, she is frequently drinking liquids with calories, she frequently makes poor food choices, and she frequently eats larger portions than normal.  Depression Screen Jennifer [name]'s Food and Mood (modified PHQ-9) score was 2.   Subjective:   1. Other fatigue Jennifer [name] admits to daytime somnolence and admits to waking up still tired. Patient has a history of symptoms of daytime fatigue and morning fatigue. Jennifer [name] generally gets 7 or 8 hours of sleep per night, and states that she has nightime awakenings. Snoring is present. Apneic episodes are present. Epworth Sleepiness Score is 1.   2. SOB (shortness of breath) on exertion Erik notes increasing shortness of breath with exercising and seems to be worsening over time with weight gain. She notes getting out of breath sooner with activity than she used to. This has not gotten worse recently. Jennifer [name] denies shortness of breath at rest or orthopnea.  3. Type 2 diabetes  mellitus with other specified complication, unspecified whether long term insulin use (HCC) HgbA1c is at goal for age and comorbid conditions. Denies symptoms of hypoglycemia or hyperglycemia.  She is not on medications.  Lab Results  Component Value Date   HGBA1C 6.3 06/05/2022   HGBA1C 6.2 10/17/2021   HGBA1C 6.3 04/26/2021   Lab Results  Component Value Date   MICROALBUR 0.8 06/05/2022   LDLCALC 51 06/05/2022   CREATININE 1.00 07/14/2022   4. Essential hypertension Blood pressure close to goal.  She is currently on triamterene hydrochlorothiazide.  Recent labs showed a decrease in GFR.   5. OSA on CPAP On CPAP with reported good compliance.   6. Vitamin D deficiency Check vitamin D levels today.  Continue supplementation adjust accordingly.  Goal level 50-60.  7. Decreased GFR Noted on recent labs no history of chronic kidney disease.  History of diet-controlled diabetes and hypertension.  This may be acute kidney injury due to use of Maxide and inadequate fluid intake.   8. Physically inactive Patient has impaired mobility due to orthopedic problems.  Assessment/Plan:   1. Other fatigue Loreine does feel that her weight is causing her energy to be lower than it should be. Fatigue may be related to obesity, depression or many other causes. Labs will be ordered, and in the meanwhile, Latissa will focus on self care including making healthy food choices, increasing physical activity and focusing on stress reduction.  - EKG 12-Lead - Vitamin B12  2. SOB (shortness of breath) on exertion Jennifer [name] does feel that she gets out of breath more easily that she  used to when she exercises. Jennifer [name]'s shortness of breath appears to be obesity related and exercise induced. She has agreed to work on weight loss and gradually increase exercise to treat her exercise induced shortness of breath. Will continue to monitor closely.  3. Type 2 diabetes mellitus with other specified complication, unspecified  whether long term insulin use (HCC) Counseled on goals of care, monitoring for complications and importance of staying updated on immunizations and diabetes preventive measures. Continue with reduced calorie meal plan low on processed crabs and simple sugars. Ongoing weight loss will improve insulin resistance and glycemic control.  - Hemoglobin A1c - Insulin, random  4. Essential hypertension We will check kidney function and electrolytes.  Patient counseled on maintaining adequate hydration.  - TSH  5. OSA on CPAP Continue PAP therapy. Losing 15% or more of body weight may improve AHI.  6. Vitamin D deficiency Check vitamin D levels today.  Continue supplementation adjust accordingly.  Goal level 50-60.  - VITAMIN D 25 Hydroxy (Vit-D Deficiency, Fractures)  7. Decreased GFR We will check kidney function and electrolytes patient advised on improving hydration and avoiding nephrotoxins.  - Comprehensive metabolic panel  8. Physically inactive We be discussed the benefits of exercise she may try low impact low intensity chair exercises.  9. Depression screen Jennifer [name] had a negative depression screening.   10. Class 2 severe obesity with serious comorbidity and body mass index (BMI) of 38.0 to 38.9 in adult, unspecified obesity type (HCC) Labs will be obtained today.   - TSH  Lacrystal is currently in the action stage of change and her goal is to continue with weight loss efforts. I recommend Jennifer [name] begin the structured treatment plan as follows:  She has agreed to the Category 2 Plan + 100 calories.  Exercise goals: All adults should avoid inactivity. Some physical activity is better than none, and adults who participate in any amount of physical activity gain some health benefits.   Behavioral modification strategies: increasing lean protein intake, decreasing simple carbohydrates, increasing vegetables, increasing water intake, decreasing liquid calories, increasing high fiber foods,  no skipping meals, and meal planning and cooking strategies.  She was informed of the importance of frequent follow-up visits to maximize her success with intensive lifestyle modifications for her multiple health conditions. She was informed we would discuss her lab results at her next visit unless there is a critical issue that needs to be addressed sooner. Khamryn agreed to keep her next visit at the agreed upon time to discuss these results.  Objective:   Blood pressure 134/76, pulse 82, temperature 98.2 F (36.8 C), height 5\' 8"  (1.727 m), weight 256 lb (116.1 kg), SpO2 97%. Body mass index is 38.92 kg/m.  EKG: Normal sinus rhythm, rate 67 BPM.  Indirect Calorimeter completed today shows a VO2 of 257 and a REE of 1771.  Her calculated basal metabolic rate is 0272 thus her basal metabolic rate is worse than expected.  General: Cooperative, alert, well developed, in no acute distress. HEENT: Conjunctivae and lids unremarkable. Cardiovascular: Regular rhythm.  Lungs: Normal work of breathing. Neurologic: No focal deficits.   Lab Results  Component Value Date   CREATININE 0.93 12/12/2022   BUN 17 12/12/2022   NA 143 12/12/2022   K 4.2 12/12/2022   CL 102 12/12/2022   CO2 25 12/12/2022   Lab Results  Component Value Date   ALT 16 12/12/2022   AST 21 12/12/2022   ALKPHOS 62 12/12/2022   BILITOT 0.2 12/12/2022  Lab Results  Component Value Date   HGBA1C 6.3 (H) 12/12/2022   HGBA1C 6.3 06/05/2022   HGBA1C 6.2 10/17/2021   HGBA1C 6.3 04/26/2021   HGBA1C 6.0 (H) 12/29/2020   Lab Results  Component Value Date   INSULIN 20.4 12/12/2022   INSULIN 12.6 05/19/2021   Lab Results  Component Value Date   TSH 4.110 12/12/2022   Lab Results  Component Value Date   CHOL 133 06/05/2022   HDL 71.80 06/05/2022   LDLCALC 51 06/05/2022   TRIG 49.0 06/05/2022   CHOLHDL 2 06/05/2022   Lab Results  Component Value Date   WBC 4.4 06/05/2022   HGB 11.8 (L) 06/05/2022   HCT 37.6  06/05/2022   MCV 89.1 06/05/2022   PLT 228 06/05/2022   Lab Results  Component Value Date   IRON 59 12/05/2021   TIBC 323 12/05/2021   FERRITIN 51 12/05/2021   Attestation Statements:   Reviewed by clinician on day of visit: allergies, medications, problem list, medical history, surgical history, family history, social history, and previous encounter notes.  Time spent on visit including pre-visit chart review and post-visit charting and care was 40 minutes.   Trude Mcburney, am acting as transcriptionist for Worthy Rancher, MD.  I have reviewed the above documentation for accuracy and completeness, and I agree with the above. -Worthy Rancher, MD

## 2022-12-13 ENCOUNTER — Ambulatory Visit (HOSPITAL_COMMUNITY)
Admission: RE | Admit: 2022-12-13 | Discharge: 2022-12-13 | Disposition: A | Discharge status: Home / Self Care | Payer: Medicare Other | Source: Ambulatory Visit | Attending: Gastroenterology | Admitting: Gastroenterology

## 2022-12-13 DIAGNOSIS — R1319 Other dysphagia: Secondary | ICD-10-CM | POA: Diagnosis not present

## 2022-12-13 DIAGNOSIS — K209 Esophagitis, unspecified without bleeding: Secondary | ICD-10-CM | POA: Insufficient documentation

## 2022-12-13 DIAGNOSIS — E041 Nontoxic single thyroid nodule: Secondary | ICD-10-CM | POA: Diagnosis not present

## 2022-12-13 DIAGNOSIS — R9389 Abnormal findings on diagnostic imaging of other specified body structures: Secondary | ICD-10-CM | POA: Diagnosis not present

## 2022-12-13 DIAGNOSIS — I3139 Other pericardial effusion (noninflammatory): Secondary | ICD-10-CM | POA: Diagnosis not present

## 2022-12-13 DIAGNOSIS — K9189 Other postprocedural complications and disorders of digestive system: Secondary | ICD-10-CM | POA: Diagnosis not present

## 2022-12-13 DIAGNOSIS — I700 Atherosclerosis of aorta: Secondary | ICD-10-CM | POA: Diagnosis not present

## 2022-12-13 DIAGNOSIS — R911 Solitary pulmonary nodule: Secondary | ICD-10-CM

## 2022-12-13 HISTORY — DX: Solitary pulmonary nodule: R91.1

## 2022-12-14 LAB — INSULIN, RANDOM: INSULIN: 20.4 u[IU]/mL (ref 2.6–24.9)

## 2022-12-14 LAB — COMPREHENSIVE METABOLIC PANEL
ALT: 16 IU/L (ref 0–32)
AST: 21 IU/L (ref 0–40)
Albumin: 4.3 g/dL (ref 3.9–4.9)
Alkaline Phosphatase: 62 IU/L (ref 44–121)
BUN/Creatinine Ratio: 18 (ref 12–28)
BUN: 17 mg/dL (ref 8–27)
Bilirubin Total: 0.2 mg/dL (ref 0.0–1.2)
CO2: 25 mmol/L (ref 20–29)
Calcium: 8.9 mg/dL (ref 8.7–10.3)
Chloride: 102 mmol/L (ref 96–106)
Creatinine, Ser: 0.93 mg/dL (ref 0.57–1.00)
Globulin, Total: 2.8 g/dL (ref 1.5–4.5)
Glucose: 108 mg/dL — ABNORMAL HIGH (ref 70–99)
Potassium: 4.2 mmol/L (ref 3.5–5.2)
Sodium: 143 mmol/L (ref 134–144)
Total Protein: 7.1 g/dL (ref 6.0–8.5)
eGFR: 67 mL/min/{1.73_m2} (ref >59)

## 2022-12-14 LAB — HEMOGLOBIN A1C
Est. average glucose Bld gHb Est-mCnc: 134 mg/dL
Hgb A1c MFr Bld: 6.3 % — ABNORMAL HIGH (ref 4.8–5.6)

## 2022-12-14 LAB — VITAMIN B12: Vitamin B-12: >2000 pg/mL — ABNORMAL HIGH (ref 232–1245)

## 2022-12-14 LAB — TSH: TSH: 4.11 u[IU]/mL (ref 0.450–4.500)

## 2022-12-14 LAB — VITAMIN D 25 HYDROXY (VIT D DEFICIENCY, FRACTURES): Vit D, 25-Hydroxy: 61.5 ng/mL (ref 30.0–100.0)

## 2022-12-24 ENCOUNTER — Other Ambulatory Visit: Payer: Self-pay | Admitting: Hematology & Oncology

## 2022-12-25 ENCOUNTER — Encounter: Payer: Self-pay | Admitting: Family

## 2022-12-26 ENCOUNTER — Encounter (INDEPENDENT_AMBULATORY_CARE_PROVIDER_SITE_OTHER): Payer: Self-pay | Admitting: Internal Medicine

## 2022-12-26 ENCOUNTER — Ambulatory Visit (INDEPENDENT_AMBULATORY_CARE_PROVIDER_SITE_OTHER): Payer: Medicare Other | Admitting: Internal Medicine

## 2022-12-26 VITALS — BP 128/79 | HR 67 | Temp 98.3°F | Ht 68.0 in | Wt 254.0 lb

## 2022-12-26 DIAGNOSIS — R944 Abnormal results of kidney function studies: Secondary | ICD-10-CM

## 2022-12-26 DIAGNOSIS — G4733 Obstructive sleep apnea (adult) (pediatric): Secondary | ICD-10-CM | POA: Diagnosis not present

## 2022-12-26 DIAGNOSIS — E1169 Type 2 diabetes mellitus with other specified complication: Secondary | ICD-10-CM

## 2022-12-26 DIAGNOSIS — Z6838 Body mass index (BMI) 38.0-38.9, adult: Secondary | ICD-10-CM

## 2022-12-26 DIAGNOSIS — Z723 Lack of physical exercise: Secondary | ICD-10-CM | POA: Diagnosis not present

## 2022-12-26 DIAGNOSIS — E559 Vitamin D deficiency, unspecified: Secondary | ICD-10-CM

## 2022-12-26 NOTE — Assessment & Plan Note (Signed)
 See obesity treatment plan

## 2022-12-26 NOTE — Assessment & Plan Note (Signed)
Her vitamin D levels was in the 60s and adequate she is not sure of what supplements she is taking.  She will bring in bottle to make sure she is not at risk for oversupplementation.

## 2022-12-26 NOTE — Assessment & Plan Note (Addendum)
On CPAP with reported good compliance. Continue PAP therapy. Losing 15% or more of body weight may improve AHI.  Consider initiation of GLP-1 therapy

## 2022-12-26 NOTE — Assessment & Plan Note (Addendum)
I reviewed most recent labs her GFR is now above 60 and improved.  Patient is on Maxide for blood pressure discussed the importance of maintaining adequate hydration and avoidance of nephrotoxins.  Blood pressure is relatively well-controlled.  Continue current regimen

## 2022-12-26 NOTE — Assessment & Plan Note (Signed)
HgbA1c is at goal for age and comorbid conditions. Denies symptoms of hypoglycemia or hyperglycemia.  She is not on medications.  Her hemoglobin A1c is rising so she is considering starting treatment.  Counseled on goals of care, monitoring for complications and importance of staying updated on immunizations and diabetes preventive measures. Continue with reduced calorie meal plan low on processed crabs and simple sugars. Ongoing weight loss will improve insulin resistance and glycemic control  Lab Results  Component Value Date   HGBA1C 6.3 (H) 12/12/2022   HGBA1C 6.3 06/05/2022   HGBA1C 6.2 10/17/2021   Lab Results  Component Value Date   MICROALBUR 0.8 06/05/2022   LDLCALC 51 06/05/2022   CREATININE 0.93 12/12/2022   We reviewed treatment options including continuing with therapeutic lifestyle changes, starting metformin for pharmacoprophylaxis or initiating incretin therapy.  Considering comorbidities like OSA, hypertension and type 2 diabetes I think she would benefit from incretin therapy.

## 2022-12-26 NOTE — Assessment & Plan Note (Signed)
 Patient has impaired mobility due to orthopedic problems.  We be discussed the benefits of exercise she may try low impact low intensity chair exercises.

## 2022-12-26 NOTE — Progress Notes (Signed)
Office: [phone]  /  Fax: [phone]  WEIGHT SUMMARY AND BIOMETRICS  Vitals Temp: 98.3 F (36.8 C) BP: 128/79 Pulse Rate: 67 SpO2: 98 %   Anthropometric Measurements Height: 5\' 8"  (1.727 m) Weight: 254 lb (115.2 kg) BMI (Calculated): 38.63 Weight at Last Visit: 259 lb Weight Lost Since Last Visit: 5 lb Weight Gained Since Last Visit: 0 lb Starting Weight: 256 lb Total Weight Loss (lbs): 5 lb (2.268 kg) Peak Weight: 259 lb   Body Composition  Body Fat %: 48.5 % Fat Mass (lbs): 123.6 lbs Muscle Mass (lbs): 124.6 lbs Total Body Water (lbs): 85.8 lbs Visceral Fat Rating : 16    RMR: 1771  Today's Visit #: 2  Starting Date: 12/12/22   HPI  Chief Complaint: OBESITY  Jennifer [name] is here to discuss her progress with her obesity treatment plan. She is on the the Category 2 Plan and states she is following her eating plan approximately 75 % of the time. She states she is exercising 30 minutes 4 times per week.  Interval History:  Since last office visit she has 5 lbs.  She reports good adherence to reduced calorie nutritional plan. She has been working on not skipping meals, increasing protein intake at every meal, eating more fruits, eating more vegetables, drinking more water, avoiding and / or reducing liquid calories, avoiding or reducing simple and processed carbohydrates, making healthier choices, and reducing portion sizes  Orexigenic Control: Denies problems with appetite and hunger signals.  Denies problems with satiety and satiation.  Denies problems with eating patterns and portion control.  Reports abnormal cravings for sweets. Denies feeling deprived or restricted.   Barriers identified: low volume of physical acitivity.   Pharmacotherapy for weight loss: She is currently taking no anti-obesity medication.    ASSESSMENT AND PLAN  TREATMENT PLAN FOR OBESITY:  Recommended Dietary Goals  Jennifer [name] is currently in the action stage of change. As such,  her goal is to continue weight management plan. She has agreed to: continue current plan  Behavioral Intervention  We discussed the following Behavioral Modification Strategies today: increasing lean protein intake, decreasing simple carbohydrates , increasing vegetables, increasing lower glycemic fruits, increasing water intake, continue to practice mindfulness when eating, and planning for success.  Additional resources provided today: None  Recommended Physical Activity Goals  Jennifer [name] has been advised to work up to 150 minutes of moderate intensity aerobic activity a week and strengthening exercises 2-3 times per week for cardiovascular health, weight loss maintenance and preservation of muscle mass.   She has agreed to :  Think about ways to increase daily physical activity and overcoming barriers to exercise and Increase physical activity in their day and reduce sedentary time (increase NEAT).  Pharmacotherapy We discussed various medication options to help Jennifer [name] with her weight loss efforts and we both agreed to : continue with nutritional and behavioral strategies and we will consider possibly starting GLP-1 therapy due to rising hemoglobin A1c with a history of diabetes.  ASSOCIATED CONDITIONS ADDRESSED TODAY  Type 2 diabetes mellitus with other specified complication, unspecified whether long term insulin use (HCC) Assessment & Plan: HgbA1c is at goal for age and comorbid conditions. Denies symptoms of hypoglycemia or hyperglycemia.  She is not on medications.  Her hemoglobin A1c is rising so she is considering starting treatment.  Counseled on goals of care, monitoring for complications and importance of staying updated on immunizations and diabetes preventive measures. Continue with reduced calorie meal plan low on processed crabs and  simple sugars. Ongoing weight loss will improve insulin resistance and glycemic control  Lab Results  Component Value Date   HGBA1C 6.3 (H)  12/12/2022   HGBA1C 6.3 06/05/2022   HGBA1C 6.2 10/17/2021   Lab Results  Component Value Date   MICROALBUR 0.8 06/05/2022   LDLCALC 51 06/05/2022   CREATININE 0.93 12/12/2022   We reviewed treatment options including continuing with therapeutic lifestyle changes, starting metformin for pharmacoprophylaxis or initiating incretin therapy.  Considering comorbidities like OSA, hypertension and type 2 diabetes I think she would benefit from incretin therapy.    OSA on CPAP Assessment & Plan: On CPAP with reported good compliance. Continue PAP therapy. Losing 15% or more of body weight may improve AHI.  Consider initiation of GLP-1 therapy    Decreased GFR Assessment & Plan: I reviewed most recent labs her GFR is now above 60 and improved.  Patient is on Maxide for blood pressure discussed the importance of maintaining adequate hydration and avoidance of nephrotoxins.  Blood pressure is relatively well-controlled.  Continue current regimen   Vitamin D deficiency Assessment & Plan: Her vitamin D levels was in the 60s and adequate she is not sure of what supplements she is taking.  She will bring in bottle to make sure she is not at risk for oversupplementation.   Physically inactive Assessment & Plan: Patient has impaired mobility due to orthopedic problems.  We be discussed the benefits of exercise she may try low impact low intensity chair exercises.   Class 2 severe obesity with serious comorbidity and body mass index (BMI) of 38.0 to 38.9 in adult, unspecified obesity type [facility]) Assessment & Plan: See obesity treatment plan     PHYSICAL EXAM:  Blood pressure 128/79, pulse 67, temperature 98.3 F (36.8 C), height 5\' 8"  (1.727 m), weight 254 lb (115.2 kg), SpO2 98%. Body mass index is 38.62 kg/m.  General: She is overweight, cooperative, alert, well developed, and in no acute distress. PSYCH: Has normal mood, affect and thought process.   HEENT: EOMI, sclerae are  anicteric. Lungs: Normal breathing effort, no conversational dyspnea. Extremities: No edema.  Neurologic: No gross sensory or motor deficits. No tremors or fasciculations noted.    DIAGNOSTIC DATA REVIEWED:  BMET    Component Value Date/Time   NA 143 12/12/2022 1100   NA 143 02/16/2017 1450   NA 142 01/19/2016 1141   K 4.2 12/12/2022 1100   K 3.7 02/16/2017 1450   K 3.7 01/19/2016 1141   CL 102 12/12/2022 1100   CL 105 02/16/2017 1450   CO2 25 12/12/2022 1100   CO2 32 02/16/2017 1450   CO2 27 01/19/2016 1141   GLUCOSE 108 (H) 12/12/2022 1100   GLUCOSE 95 06/05/2022 1347   GLUCOSE 88 02/16/2017 1450   BUN 17 12/12/2022 1100   BUN 14 02/16/2017 1450   BUN 19.5 01/19/2016 1141   CREATININE 0.93 12/12/2022 1100   CREATININE 0.93 06/05/2022 1347   CREATININE 1.2 02/16/2017 1450   CREATININE 0.9 01/19/2016 1141   CALCIUM 8.9 12/12/2022 1100   CALCIUM 8.8 02/16/2017 1450   CALCIUM 8.8 01/19/2016 1141   GFRNONAA >60 06/05/2022 1347   GFRAA >60 12/25/2019 1353   Lab Results  Component Value Date   HGBA1C 6.3 (H) 12/12/2022   HGBA1C 6.2 (H) 10/14/2007   Lab Results  Component Value Date   INSULIN 20.4 12/12/2022   INSULIN 12.6 05/19/2021   Lab Results  Component Value Date   TSH 4.110 12/12/2022  CBC    Component Value Date/Time   WBC 4.4 06/05/2022 1347   WBC 4.5 06/05/2022 1320   RBC 4.22 06/05/2022 1347   HGB 11.8 (L) 06/05/2022 1347   HGB 12.2 06/05/2022 1320   HGB 12.1 02/16/2017 1450   HCT 37.6 06/05/2022 1347   HCT 36.6 02/16/2017 1450   PLT 228 06/05/2022 1347   PLT 262.0 06/05/2022 1320   PLT 239 02/16/2017 1450   MCV 89.1 06/05/2022 1347   MCV 87 02/16/2017 1450   MCH 28.0 06/05/2022 1347   MCHC 31.4 06/05/2022 1347   RDW 15.3 06/05/2022 1347   RDW 15.4 02/16/2017 1450   Iron Studies    Component Value Date/Time   IRON 59 12/05/2021 1451   IRON 49 02/16/2017 1536   TIBC 323 12/05/2021 1451   TIBC 284 02/16/2017 1536   FERRITIN 51  12/05/2021 1452   FERRITIN 66 02/16/2017 1536   IRONPCTSAT 18 12/05/2021 1451   IRONPCTSAT 17 (L) 02/16/2017 1536   Lipid Panel     Component Value Date/Time   CHOL 133 06/05/2022 1320   TRIG 49.0 06/05/2022 1320   HDL 71.80 06/05/2022 1320   CHOLHDL 2 06/05/2022 1320   VLDL 9.8 06/05/2022 1320   LDLCALC 51 06/05/2022 1320   Hepatic Function Panel     Component Value Date/Time   PROT 7.1 12/12/2022 1100   PROT 7.3 02/16/2017 1450   PROT 7.3 01/19/2016 1141   ALBUMIN 4.3 12/12/2022 1100   ALBUMIN 3.6 01/19/2016 1141   AST 21 12/12/2022 1100   AST 23 06/05/2022 1347   AST 21 01/19/2016 1141   ALT 16 12/12/2022 1100   ALT 17 06/05/2022 1347   ALT 27 02/16/2017 1450   ALT 18 01/19/2016 1141   ALKPHOS 62 12/12/2022 1100   ALKPHOS 57 02/16/2017 1450   ALKPHOS 59 01/19/2016 1141   BILITOT 0.2 12/12/2022 1100   BILITOT 0.6 06/05/2022 1347   BILITOT 0.31 01/19/2016 1141   BILIDIR 0.0 01/08/2012 1224   IBILI 0.2 12/30/2010 1515      Component Value Date/Time   TSH 4.110 12/12/2022 1100   Nutritional Lab Results  Component Value Date   VD25OH 61.5 12/12/2022   VD25OH 42.2 05/19/2021     No follow-ups on file.Marland Kitchen She was informed of the importance of frequent follow up visits to maximize her success with intensive lifestyle modifications for her multiple health conditions.   ATTESTASTION STATEMENTS:  Reviewed by clinician on day of visit: allergies, medications, problem list, medical history, surgical history, family history, social history, and previous encounter notes.   I have spent 40 minutes in the care of the patient today including: preparing to see patient (e.g. review and interpretation of tests, old notes ), obtaining and/or reviewing separately obtained history, performing a medically appropriate examination or evaluation, counseling and educating the patient, documenting clinical information in the electronic or other health care record, and independently  interpreting results and communicating results to the patient, family, or caregiver   Worthy Rancher, MD

## 2022-12-26 NOTE — Addendum Note (Signed)
Addended by: Lyda Kalata on: 12/26/2022 05:08 PM   Modules accepted: Level of Service

## 2022-12-27 ENCOUNTER — Inpatient Hospital Stay: Payer: Medicare Other | Attending: Hematology & Oncology

## 2022-12-27 ENCOUNTER — Inpatient Hospital Stay (HOSPITAL_BASED_OUTPATIENT_CLINIC_OR_DEPARTMENT_OTHER): Payer: Medicare Other | Admitting: Hematology & Oncology

## 2022-12-27 ENCOUNTER — Encounter: Payer: Self-pay | Admitting: Hematology & Oncology

## 2022-12-27 VITALS — BP 147/66 | HR 61 | Temp 98.0°F | Resp 20 | Ht 68.0 in | Wt 257.1 lb

## 2022-12-27 DIAGNOSIS — Z86711 Personal history of pulmonary embolism: Secondary | ICD-10-CM | POA: Insufficient documentation

## 2022-12-27 DIAGNOSIS — Z7901 Long term (current) use of anticoagulants: Secondary | ICD-10-CM | POA: Diagnosis not present

## 2022-12-27 DIAGNOSIS — R1310 Dysphagia, unspecified: Secondary | ICD-10-CM | POA: Insufficient documentation

## 2022-12-27 DIAGNOSIS — I2690 Septic pulmonary embolism without acute cor pulmonale: Secondary | ICD-10-CM

## 2022-12-27 DIAGNOSIS — I2601 Septic pulmonary embolism with acute cor pulmonale: Secondary | ICD-10-CM

## 2022-12-27 DIAGNOSIS — R911 Solitary pulmonary nodule: Secondary | ICD-10-CM | POA: Diagnosis not present

## 2022-12-27 LAB — LACTATE DEHYDROGENASE: LDH: 186 U/L (ref 98–192)

## 2022-12-27 LAB — CBC WITH DIFFERENTIAL (CANCER CENTER ONLY)
Abs Immature Granulocytes: 0.01 10*3/uL (ref 0.00–0.07)
Basophils Absolute: 0 10*3/uL (ref 0.0–0.1)
Basophils Relative: 1 %
Eosinophils Absolute: 0.2 10*3/uL (ref 0.0–0.5)
Eosinophils Relative: 5 %
HCT: 39.4 % (ref 36.0–46.0)
Hemoglobin: 12.4 g/dL (ref 12.0–15.0)
Immature Granulocytes: 0 %
Lymphocytes Relative: 46 %
Lymphs Abs: 1.7 10*3/uL (ref 0.7–4.0)
MCH: 28.2 pg (ref 26.0–34.0)
MCHC: 31.5 g/dL (ref 30.0–36.0)
MCV: 89.5 fL (ref 80.0–100.0)
Monocytes Absolute: 0.5 10*3/uL (ref 0.1–1.0)
Monocytes Relative: 13 %
Neutro Abs: 1.3 10*3/uL — ABNORMAL LOW (ref 1.7–7.7)
Neutrophils Relative %: 35 %
Platelet Count: 237 10*3/uL (ref 150–400)
RBC: 4.4 MIL/uL (ref 3.87–5.11)
RDW: 15 % (ref 11.5–15.5)
WBC Count: 3.7 10*3/uL — ABNORMAL LOW (ref 4.0–10.5)
nRBC: 0 % (ref 0.0–0.2)

## 2022-12-27 LAB — CMP (CANCER CENTER ONLY)
ALT: 15 U/L (ref 0–44)
AST: 24 U/L (ref 15–41)
Albumin: 4.3 g/dL (ref 3.5–5.0)
Alkaline Phosphatase: 44 U/L (ref 38–126)
Anion gap: 8 (ref 5–15)
BUN: 15 mg/dL (ref 8–23)
CO2: 31 mmol/L (ref 22–32)
Calcium: 9.3 mg/dL (ref 8.9–10.3)
Chloride: 103 mmol/L (ref 98–111)
Creatinine: 0.89 mg/dL (ref 0.44–1.00)
GFR, Estimated: >60 mL/min (ref >60)
Glucose, Bld: 113 mg/dL — ABNORMAL HIGH (ref 70–99)
Potassium: 4.2 mmol/L (ref 3.5–5.1)
Sodium: 142 mmol/L (ref 135–145)
Total Bilirubin: 0.4 mg/dL (ref 0.3–1.2)
Total Protein: 7.6 g/dL (ref 6.5–8.1)

## 2022-12-27 NOTE — Progress Notes (Signed)
Hematology and Oncology Follow Up Visit  ARRI KOCHAN [id number] [birth year] 72 y.o. 12/27/2022   Principle Diagnosis:  Recurrent pulmonary embolism   Current Therapy:        Xarelto 5 mg po q day - lifelong   Interim History:  Ms. Siekierski is here today for follow-up.  We last saw her back in February.  Since then, she been doing okay.  The real problem that she has a says she is having some dysphagia.  She saw gastroenterology.  She apparently has a narrowing down at the lower esophagus.  She has had a past Nissen fundoplication.  Apparently this is too tight.  Food is still getting stuck down in the lower esophagus.  She has had a hard time passing through.  Prior to going to have to see thoracic surgery to see if they may be able to fix this.  She just got back from [place].  She and her husband were out in [place].  That a good time out there.  She was quite active.  She is trying to lose some weight.  She had a CT scan of the chest on 12/13/2022.  Everything looked fine.  She has stable 4 mm nodule in the left upper lobe.  She had the esophageal thickening consistent with the history of esophagitis.  Of note, when she had the upper endoscopy, biopsies were taken which showed she had fungus.  There is no Barrett's esophagus.  She is really hesitant about doing surgery.  I talked her a lot about this.  It sounds like she is really going to need this as I still see out of the way that she is going to get better.  As far as her thromboembolic disease is concerned, she is doing well.  She is on low-dose Xarelto and having no problems with bleeding..  She does have some pain in her legs.  This is chronic.  Overall, I would say that her performance status is probably ECOG 1.   Medications:  Allergies as of 12/27/2022       Reactions   Morphine And Codeine Anaphylaxis, Shortness Of Breath   Sulfonamide Derivatives Hives   Burn from inside out   Promethazine Hcl Other (See Comments)    Hallucinations        Medication List        Accurate as of December 27, 2022  4:04 PM. If you have any questions, ask your nurse or doctor.          acetaminophen 500 MG tablet Commonly known as: TYLENOL Take 1 tablet (500 mg total) by mouth every 6 (six) hours as needed.   Aciphex 20 MG tablet Generic drug: RABEprazole Take 1 tablet (20 mg total) by mouth 2 (two) times daily.   hydrocortisone valerate cream 0.2 % Commonly known as: WESTCORT Apply topically.   hyoscyamine 0.125 MG SL tablet Commonly known as: LEVSIN SL DISSOLVE ONE TABLET UNDER TONGUE EVERY FOUR HOURS AS NEEDED   lidocaine 5 % Commonly known as: LIDODERM 1 patch daily.   multivitamin with minerals tablet Take 1 tablet by mouth daily. Rainbow light   Prevagen 10 MG Caps Generic drug: Apoaequorin Take 10 mg by mouth daily. prevagen   Restasis 0.05 % ophthalmic emulsion Generic drug: cycloSPORINE Restasis 0.05 % eye drops in a dropperette  INSTILL 1 DROP INTO EACH EYE TWICE DAILY   rosuvastatin 10 MG tablet Commonly known as: Crestor Take 1 tablet (10 mg total) by mouth at bedtime.  temazepam 30 MG capsule Commonly known as: RESTORIL TAKE ONE CAPSULE BY MOUTH AT BEDTIME AS NEEDED FOR SLEEP   triamterene-hydrochlorothiazide 37.5-25 MG tablet Commonly known as: MAXZIDE-25 Take 0.5 tablets by mouth daily.   Xarelto 2.5 MG Tabs tablet Generic drug: rivaroxaban TAKE TWO TABLETS BY MOUTH DAILY        Allergies:  Allergies  Allergen Reactions   Morphine And Codeine Anaphylaxis and Shortness Of Breath   Sulfonamide Derivatives Hives    Burn from inside out   Promethazine Hcl Other (See Comments)    Hallucinations    Past Medical History, Surgical history, Social history, and Family History were reviewed and updated.  Review of Systems: Review of Systems  Constitutional: Negative.   HENT: Negative.    Eyes: Negative.   Respiratory: Negative.    Cardiovascular: Negative.    Gastrointestinal: Negative.   Genitourinary: Negative.   Musculoskeletal:  Positive for joint pain.  Skin: Negative.   Neurological: Negative.   Endo/Heme/Allergies: Negative.   Psychiatric/Behavioral: Negative.       Physical Exam:  height is 5\' 8"  (1.727 m) and weight is 257 lb 1.9 oz (116.6 kg). Her oral temperature is 98 F (36.7 C). Her blood pressure is 134/71 (pended) and her pulse is 61. Her respiration is 20 and oxygen saturation is 99%.   Wt Readings from Last 3 Encounters:  12/27/22 257 lb 1.9 oz (116.6 kg)  12/26/22 254 lb (115.2 kg)  12/12/22 259 lb (117.5 kg)    Physical Exam Vitals reviewed.  HENT:     Head: Normocephalic and atraumatic.  Eyes:     Pupils: Pupils are equal, round, and reactive to light.  Cardiovascular:     Rate and Rhythm: Normal rate and regular rhythm.     Heart sounds: Normal heart sounds.  Pulmonary:     Effort: Pulmonary effort is normal.     Breath sounds: Normal breath sounds.  Abdominal:     General: Bowel sounds are normal.     Palpations: Abdomen is soft.  Musculoskeletal:        General: No tenderness or deformity. Normal range of motion.     Cervical back: Normal range of motion.  Lymphadenopathy:     Cervical: No cervical adenopathy.  Skin:    General: Skin is warm and dry.     Findings: No erythema or rash.  Neurological:     Mental Status: She is alert and oriented to person, place, and time.  Psychiatric:        Behavior: Behavior normal.        Thought Content: Thought content normal.        Judgment: Judgment normal.     Lab Results  Component Value Date   WBC 3.7 (L) 12/27/2022   HGB 12.4 12/27/2022   HCT 39.4 12/27/2022   MCV 89.5 12/27/2022   PLT 237 12/27/2022   Lab Results  Component Value Date   FERRITIN 51 12/05/2021   IRON 59 12/05/2021   TIBC 323 12/05/2021   UIBC 264 12/05/2021   IRONPCTSAT 18 12/05/2021   Lab Results  Component Value Date   RETICCTPCT 1.5 12/05/2021   RBC 4.40  12/27/2022   No results found for: "KPAFRELGTCHN", "LAMBDASER", "KAPLAMBRATIO" No results found for: "IGGSERUM", "IGA", "IGMSERUM" No results found for: "TOTALPROTELP", "ALBUMINELP", "A1GS", "A2GS", "BETS", "BETA2SER", "GAMS", "MSPIKE", "SPEI"   Chemistry      Component Value Date/Time   NA 142 12/27/2022 1455   NA 143 12/12/2022 1100  NA 143 02/16/2017 1450   NA 142 01/19/2016 1141   K 4.2 12/27/2022 1455   K 3.7 02/16/2017 1450   K 3.7 01/19/2016 1141   CL 103 12/27/2022 1455   CL 105 02/16/2017 1450   CO2 31 12/27/2022 1455   CO2 32 02/16/2017 1450   CO2 27 01/19/2016 1141   BUN 15 12/27/2022 1455   BUN 17 12/12/2022 1100   BUN 14 02/16/2017 1450   BUN 19.5 01/19/2016 1141   CREATININE 0.89 12/27/2022 1455   CREATININE 1.2 02/16/2017 1450   CREATININE 0.9 01/19/2016 1141      Component Value Date/Time   CALCIUM 9.3 12/27/2022 1455   CALCIUM 8.8 02/16/2017 1450   CALCIUM 8.8 01/19/2016 1141   ALKPHOS 44 12/27/2022 1455   ALKPHOS 57 02/16/2017 1450   ALKPHOS 59 01/19/2016 1141   AST 24 12/27/2022 1455   AST 21 01/19/2016 1141   ALT 15 12/27/2022 1455   ALT 27 02/16/2017 1450   ALT 18 01/19/2016 1141   BILITOT 0.4 12/27/2022 1455   BILITOT 0.31 01/19/2016 1141       Impression and Plan: Ms. Hotop is a very pleasant 72 yo African American female with history of recurrent PE.   Her initial thrombus was diagnosed in 2012 and then recurred in 2015.   She is on lifelong anticoagulation with Xarelto 5 mg PO daily and tolerating well.   Again, if she does need to have surgery, I do not see a problem with this.  We can just hold these Xarelto for couple days.  She is not sure when she will see the thoracic surgeon.  Overall, I would just plan to get her back in 6 months.  We can certainly get her back sooner depending on any surgery for the esophagus.   Josph Macho, MD 9/4/20244:04 PM

## 2022-12-28 ENCOUNTER — Encounter: Payer: Self-pay | Admitting: Gastroenterology

## 2023-01-12 ENCOUNTER — Other Ambulatory Visit: Payer: Self-pay | Admitting: Internal Medicine

## 2023-01-22 NOTE — Telephone Encounter (Signed)
Inbound call from patient, requesting to speak with a nurse in regards to referral to Dr. Cliffton Asters. Patient states she called their office and referral has yet been sent. Patient has provided a fax number given by them,   [phone]  Attn: Romana Juniper

## 2023-01-30 ENCOUNTER — Other Ambulatory Visit: Payer: Self-pay | Admitting: Hematology & Oncology

## 2023-01-31 ENCOUNTER — Ambulatory Visit (INDEPENDENT_AMBULATORY_CARE_PROVIDER_SITE_OTHER): Payer: Medicare Other | Admitting: Internal Medicine

## 2023-01-31 ENCOUNTER — Encounter (INDEPENDENT_AMBULATORY_CARE_PROVIDER_SITE_OTHER): Payer: Self-pay | Admitting: Internal Medicine

## 2023-01-31 ENCOUNTER — Encounter: Payer: Self-pay | Admitting: Family

## 2023-01-31 VITALS — BP 148/81 | HR 90 | Temp 98.1°F | Ht 68.0 in | Wt 256.0 lb

## 2023-01-31 DIAGNOSIS — Z6838 Body mass index (BMI) 38.0-38.9, adult: Secondary | ICD-10-CM

## 2023-01-31 DIAGNOSIS — G4733 Obstructive sleep apnea (adult) (pediatric): Secondary | ICD-10-CM | POA: Diagnosis not present

## 2023-01-31 DIAGNOSIS — E66812 Obesity, class 2: Secondary | ICD-10-CM | POA: Diagnosis not present

## 2023-01-31 DIAGNOSIS — Z723 Lack of physical exercise: Secondary | ICD-10-CM | POA: Diagnosis not present

## 2023-01-31 DIAGNOSIS — E1169 Type 2 diabetes mellitus with other specified complication: Secondary | ICD-10-CM

## 2023-01-31 NOTE — Progress Notes (Addendum)
Office: [phone]  /  Fax: [phone]  WEIGHT SUMMARY AND BIOMETRICS  Vitals Temp: 98.1 F (36.7 C) BP: (!) 148/81 Pulse Rate: 90 SpO2: 96 %   Anthropometric Measurements Height: 5\' 8"  (1.727 m) Weight: 256 lb (116.1 kg) BMI (Calculated): 38.93 Weight at Last Visit: 254 lb Weight Lost Since Last Visit: 0 Weight Gained Since Last Visit: 2 lb Starting Weight: 256 lb Total Weight Loss (lbs): 0 lb (0 kg) Peak Weight: 259 lb Waist Measurement : 45 inches   Body Composition  Body Fat %: 47.8 % Fat Mass (lbs): 122.4 lbs Muscle Mass (lbs): 127 lbs Total Body Water (lbs): 84.4 lbs Visceral Fat Rating : 16    RMR: 1771  Today's Visit #: 3  Starting Date: 12/12/22   HPI  Chief Complaint: OBESITY  Jennifer [name] is here to discuss her progress with her obesity treatment plan. She is on the the Category 2 Plan and states she is following her eating plan approximately 75 % of the time. She states she is exercising 30 minutes 4 times per week.  Discussed the use of AI scribe software for clinical note transcription with the patient, who gave verbal consent to proceed.  History of Present Illness    The patient, with a history of diabetes, GERD, and irritable bowel syndrome, reports a recent weight gain of two pounds after a previous loss of five pounds. She attributes this to a decrease in exercise and a less strict diet, including the consumption of homemade brownies.   The patient has been experiencing episodes of irritable bowel syndrome, which she describes as 'attacks' that take over her body and require about two days for recovery. She also mentions a sensitivity to certain foods, including wraps and a strawberry-banana-protein smoothie, which upset her stomach. She identifies as lactose intolerant.  The patient has been managing her diabetes through diet and weight loss, but notes a recent increase in her blood sugar levels. She denies any constipation and reports  regular use of a CPAP machine for an unspecified condition.  Despite the weight gain, the patient's body composition analysis shows an increase in muscle mass and a decrease in body fat percentage. She has been doing hand weights at home. She also has a recumbent bike at home, which she uses occasionally.  The patient expresses a desire to increase her physical activity and improve her diet, but acknowledges a struggle with motivation. She expresses a willingness to consider medical treatment for her diabetes in the future.        Barriers identified: low volume of physical acitivity.   Pharmacotherapy for weight loss: She is currently taking no anti-obesity medication.    ASSESSMENT AND PLAN  TREATMENT PLAN FOR OBESITY:  Recommended Dietary Goals  Brady is currently in the action stage of change. As such, her goal is to continue weight management plan. She has agreed to: continue current plan  Behavioral Intervention  We discussed the following Behavioral Modification Strategies today: increasing lean protein intake, decreasing simple carbohydrates , increasing vegetables, increasing lower glycemic fruits, increasing water intake, continue to practice mindfulness when eating, and planning for success.  Additional resources provided today: None  Recommended Physical Activity Goals  Jaclynne has been advised to work up to 150 minutes of moderate intensity aerobic activity a week and strengthening exercises 2-3 times per week for cardiovascular health, weight loss maintenance and preservation of muscle mass.   She has agreed to :  Think about ways to increase daily physical activity  and overcoming barriers to exercise and Increase physical activity in their day and reduce sedentary time (increase NEAT).  Pharmacotherapy We discussed various medication options to help Shaundra with her weight loss efforts and we both agreed to : continue with nutritional and behavioral strategies and we will  consider possibly starting GLP-1 therapy due to rising hemoglobin A1c with a history of diabetes.  ASSOCIATED CONDITIONS ADDRESSED TODAY  Type 2 diabetes mellitus with other specified complication, unspecified whether long term insulin use (HCC) Assessment & Plan: HgbA1c is at goal for age and comorbid conditions. Denies symptoms of hypoglycemia or hyperglycemia.  She is not on medications.  Her hemoglobin A1c is rising so she is considering starting treatment.  Counseled on goals of care, monitoring for complications and importance of staying updated on immunizations and diabetes preventive measures. Continue with reduced calorie meal plan low on processed crabs and simple sugars. Ongoing weight loss will improve insulin resistance and glycemic control  Lab Results  Component Value Date   HGBA1C 6.3 (H) 12/12/2022   HGBA1C 6.3 06/05/2022   HGBA1C 6.2 10/17/2021   Lab Results  Component Value Date   MICROALBUR 0.8 06/05/2022   LDLCALC 51 06/05/2022   CREATININE 0.89 12/27/2022   We reviewed treatment options including continuing with therapeutic lifestyle changes, starting metformin for pharmacoprophylaxis or initiating incretin therapy.  Considering comorbidities like OSA, hypertension and type 2 diabetes I think she would benefit from incretin therapy.  She will think about it.  She was given information on GLP-1.    OSA on CPAP Assessment & Plan: On CPAP with reported good compliance. Continue PAP therapy. Losing 15% or more of body weight may improve AHI.  Consider initiation of GLP-1 therapy    Class 2 severe obesity with serious comorbidity and body mass index (BMI) of 38.0 to 38.9 in adult, unspecified obesity type [facility]) Assessment & Plan: See obesity treatment plan   Physically inactive Assessment & Plan: Patient has impaired mobility due to orthopedic problems.  We be discussed the benefits of exercise she may try low impact low intensity chair  exercises.     PHYSICAL EXAM:  Blood pressure (!) 148/81, pulse 90, temperature 98.1 F (36.7 C), height 5\' 8"  (1.727 m), weight 256 lb (116.1 kg), SpO2 96%. Body mass index is 38.92 kg/m.  General: She is overweight, cooperative, alert, well developed, and in no acute distress. PSYCH: Has normal mood, affect and thought process.   HEENT: EOMI, sclerae are anicteric. Lungs: Normal breathing effort, no conversational dyspnea. Extremities: No edema.  Neurologic: No gross sensory or motor deficits. No tremors or fasciculations noted.    DIAGNOSTIC DATA REVIEWED:  BMET    Component Value Date/Time   NA 142 12/27/2022 1455   NA 143 12/12/2022 1100   NA 143 02/16/2017 1450   NA 142 01/19/2016 1141   K 4.2 12/27/2022 1455   K 3.7 02/16/2017 1450   K 3.7 01/19/2016 1141   CL 103 12/27/2022 1455   CL 105 02/16/2017 1450   CO2 31 12/27/2022 1455   CO2 32 02/16/2017 1450   CO2 27 01/19/2016 1141   GLUCOSE 113 (H) 12/27/2022 1455   GLUCOSE 88 02/16/2017 1450   BUN 15 12/27/2022 1455   BUN 17 12/12/2022 1100   BUN 14 02/16/2017 1450   BUN 19.5 01/19/2016 1141   CREATININE 0.89 12/27/2022 1455   CREATININE 1.2 02/16/2017 1450   CREATININE 0.9 01/19/2016 1141   CALCIUM 9.3 12/27/2022 1455   CALCIUM 8.8 02/16/2017  1450   CALCIUM 8.8 01/19/2016 1141   GFRNONAA >60 12/27/2022 1455   GFRAA >60 12/25/2019 1353   Lab Results  Component Value Date   HGBA1C 6.3 (H) 12/12/2022   HGBA1C 6.2 (H) 10/14/2007   Lab Results  Component Value Date   INSULIN 20.4 12/12/2022   INSULIN 12.6 05/19/2021   Lab Results  Component Value Date   TSH 4.110 12/12/2022   CBC    Component Value Date/Time   WBC 3.7 (L) 12/27/2022 1455   WBC 4.5 06/05/2022 1320   RBC 4.40 12/27/2022 1455   HGB 12.4 12/27/2022 1455   HGB 12.1 02/16/2017 1450   HCT 39.4 12/27/2022 1455   HCT 36.6 02/16/2017 1450   PLT 237 12/27/2022 1455   PLT 239 02/16/2017 1450   MCV 89.5 12/27/2022 1455   MCV 87  02/16/2017 1450   MCH 28.2 12/27/2022 1455   MCHC 31.5 12/27/2022 1455   RDW 15.0 12/27/2022 1455   RDW 15.4 02/16/2017 1450   Iron Studies    Component Value Date/Time   IRON 59 12/05/2021 1451   IRON 49 02/16/2017 1536   TIBC 323 12/05/2021 1451   TIBC 284 02/16/2017 1536   FERRITIN 51 12/05/2021 1452   FERRITIN 66 02/16/2017 1536   IRONPCTSAT 18 12/05/2021 1451   IRONPCTSAT 17 (L) 02/16/2017 1536   Lipid Panel     Component Value Date/Time   CHOL 133 06/05/2022 1320   TRIG 49.0 06/05/2022 1320   HDL 71.80 06/05/2022 1320   CHOLHDL 2 06/05/2022 1320   VLDL 9.8 06/05/2022 1320   LDLCALC 51 06/05/2022 1320   Hepatic Function Panel     Component Value Date/Time   PROT 7.6 12/27/2022 1455   PROT 7.1 12/12/2022 1100   PROT 7.3 02/16/2017 1450   PROT 7.3 01/19/2016 1141   ALBUMIN 4.3 12/27/2022 1455   ALBUMIN 4.3 12/12/2022 1100   ALBUMIN 3.6 01/19/2016 1141   AST 24 12/27/2022 1455   AST 21 01/19/2016 1141   ALT 15 12/27/2022 1455   ALT 27 02/16/2017 1450   ALT 18 01/19/2016 1141   ALKPHOS 44 12/27/2022 1455   ALKPHOS 57 02/16/2017 1450   ALKPHOS 59 01/19/2016 1141   BILITOT 0.4 12/27/2022 1455   BILITOT 0.31 01/19/2016 1141   BILIDIR 0.0 01/08/2012 1224   IBILI 0.2 12/30/2010 1515      Component Value Date/Time   TSH 4.110 12/12/2022 1100   Nutritional Lab Results  Component Value Date   VD25OH 61.5 12/12/2022   VD25OH 42.2 05/19/2021     Return in about 3 weeks (around 02/21/2023) for For Weight Mangement with Dr. Rikki Spearing.Marland Kitchen She was informed of the importance of frequent follow up visits to maximize her success with intensive lifestyle modifications for her multiple health conditions.   ATTESTASTION STATEMENTS:  Reviewed by clinician on day of visit: allergies, medications, problem list, medical history, surgical history, family history, social history, and previous encounter notes.   I have spent 40 minutes in the care of the patient today  including: preparing to see patient (e.g. review and interpretation of tests, old notes ), obtaining and/or reviewing separately obtained history, performing a medically appropriate examination or evaluation, counseling and educating the patient, documenting clinical information in the electronic or other health care record, and independently interpreting results and communicating results to the patient, family, or caregiver   Worthy Rancher, MD

## 2023-01-31 NOTE — Assessment & Plan Note (Signed)
HgbA1c is at goal for age and comorbid conditions. Denies symptoms of hypoglycemia or hyperglycemia.  She is not on medications.  Her hemoglobin A1c is rising so she is considering starting treatment.  Counseled on goals of care, monitoring for complications and importance of staying updated on immunizations and diabetes preventive measures. Continue with reduced calorie meal plan low on processed crabs and simple sugars. Ongoing weight loss will improve insulin resistance and glycemic control  Lab Results  Component Value Date   HGBA1C 6.3 (H) 12/12/2022   HGBA1C 6.3 06/05/2022   HGBA1C 6.2 10/17/2021   Lab Results  Component Value Date   MICROALBUR 0.8 06/05/2022   LDLCALC 51 06/05/2022   CREATININE 0.89 12/27/2022   We reviewed treatment options including continuing with therapeutic lifestyle changes, starting metformin for pharmacoprophylaxis or initiating incretin therapy.  Considering comorbidities like OSA, hypertension and type 2 diabetes I think she would benefit from incretin therapy.  She will think about it.  She was given information on GLP-1.

## 2023-01-31 NOTE — Assessment & Plan Note (Signed)
 Patient has impaired mobility due to orthopedic problems.  We be discussed the benefits of exercise she may try low impact low intensity chair exercises.

## 2023-01-31 NOTE — Assessment & Plan Note (Signed)
 See obesity treatment plan

## 2023-01-31 NOTE — Assessment & Plan Note (Signed)
On CPAP with reported good compliance. Continue PAP therapy. Losing 15% or more of body weight may improve AHI.  Consider initiation of GLP-1 therapy

## 2023-02-02 ENCOUNTER — Other Ambulatory Visit: Payer: Self-pay | Admitting: Thoracic Surgery (Cardiothoracic Vascular Surgery)

## 2023-02-02 ENCOUNTER — Institutional Professional Consult (permissible substitution) (INDEPENDENT_AMBULATORY_CARE_PROVIDER_SITE_OTHER): Payer: Medicare Other | Admitting: Thoracic Surgery (Cardiothoracic Vascular Surgery)

## 2023-02-02 ENCOUNTER — Encounter: Payer: Self-pay | Admitting: Thoracic Surgery (Cardiothoracic Vascular Surgery)

## 2023-02-02 ENCOUNTER — Other Ambulatory Visit: Payer: Self-pay

## 2023-02-02 ENCOUNTER — Encounter (HOSPITAL_COMMUNITY): Payer: Self-pay | Admitting: Thoracic Surgery (Cardiothoracic Vascular Surgery)

## 2023-02-02 ENCOUNTER — Encounter: Payer: Self-pay | Admitting: General Surgery

## 2023-02-02 VITALS — BP 159/83 | HR 77 | Resp 20 | Ht 68.0 in | Wt 260.0 lb

## 2023-02-02 DIAGNOSIS — K222 Esophageal obstruction: Secondary | ICD-10-CM

## 2023-02-02 DIAGNOSIS — R1010 Upper abdominal pain, unspecified: Secondary | ICD-10-CM

## 2023-02-02 NOTE — Progress Notes (Signed)
PCP - Dr Willow Ora Cardiologist - none Jennifer [name] - Dr Marsa Aris Oncology - Dr Arlan Organ  CT Chest x-ray - 12/13/22 EKG - 12/12/22 Stress Test - 11/01/04- Normal (See Allison's note from 05/09/12) ECHO - 10/31/04-Normal Cardiac Cath - n/a  ICD Pacemaker/Loop - n/a  Sleep Study -  Yes CPAP - uses CPAP  Diabetes Type 2, no meds If your blood sugar is less than 70 mg/dL, you will need to treat for low blood sugar: Treat a low blood sugar (less than 70 mg/dL) with  cup of clear juice (cranberry or apple), 4 glucose tablets, OR glucose gel. Recheck blood sugar in 15 minutes after treatment (to make sure it is greater than 70 mg/dL). If your blood sugar is not greater than 70 mg/dL on recheck, call [phone] for further instructions.  Blood Thinner Instructions:  Follow your surgeon's instructions on when to stop Xarelto prior to surgery. Last dose was on 02/02/23.  NPO  Anesthesia review: Yes  STOP now taking any Aspirin (unless otherwise instructed by your surgeon), Aleve, Naproxen, Ibuprofen, Motrin, Advil, Goody's, BC's, all herbal medications, fish oil, and all vitamins.   Coronavirus Screening Do you have any of the following symptoms:  Cough yes/no: Yes Fever (>100.54F)  yes/no: No Runny nose yes/no: No Sore throat yes/no: No Difficulty breathing/shortness of breath  yes/no: No  Have you traveled in the last 14 days and where? yes/no: No  Patient verbalized understanding of instructions that were given via phone.

## 2023-02-02 NOTE — Progress Notes (Signed)
[address]       [place] [address]             [phone]                    BADIA DERCK [facility] Medical Record #[id number] Date of Birth: 03/21/1951  Referring: Napoleon Form, MD Primary Care: Wanda Plump, MD Primary Cardiologist: None  Chief Complaint:    Chief Complaint  Patient presents with   Esophageal Achalasia    New patient consultation [facility] END[facility] 5/14, Chest CT 8/21    History of Present Illness:    Jennifer [name] 72 y.o. female presents for surgical evaluation of a distal esophageal stricture.  She has undergone a Nissen fundoplication with hiatal hernia repair on 3 separate occasions via a laparotomy.  Her last surgery was in 2003, where she had extensive lysis of adhesions and a gortex mesh placed to reconstruct her hiatus.  Up until about 10 years ago she was doing well, but then started developing some dysphagia, and odynophagia.  Over the last 5 years her symptoms have worsened.  In 2012, she was seen at [facility], where she was noted to have candidiasis, and a stricture.  She was scheduled to undergo monthly dilation, but stopped after 6 months due to pain.    She currently complains of constant chest pain, and burning with acidic foods.  She regularly has dysphagia, and has to throw up food that gets caught.  Her weight has been stable.  She also complains of significant mucus that she coughs up every morning.      Past Medical History:  Diagnosis Date   Allergy    Anemia    Arthritis    Cataract    bil cataracts removed   Chronic chest pain    Chronic fatigue 02/03/2017   Clotting disorder [facility])    Pulmonary [place] 2010 & 2015   Colon polyps    inflamed partially ulcerated hyperplastic polyp   Diabetes (HCC) 09/24/2013   no meds   Diverticulosis    Dysphagia esophageal phase - chronic after 3 fundoplications 01/20/2014   Edema of both lower extremities    Esophageal stenosis    Stricture after fundoplication REDO    Fibromyalgia    GERD (gastroesophageal reflux disease)    Glaucoma suspect    Heart murmur    no problems    Hemorrhoids, internal, with bleeding 06/27/2017   History of hiatal hernia    Hypertension    IBS (irritable bowel syndrome)    Lactose intolerance    Mitral valve prolapse    OSA (obstructive sleep apnea)    on CPAP-Mild - Sleep study 2004   Pre-diabetes    Pulmonary embolus (HCC)    2010 and 12-2013   Sleep apnea    wears CPAP   Thyroid nodule      dr. watching    Tinnitus    Subjective   [place] 2014   R flank    Past Surgical History:  Procedure Laterality Date   ABDOMINAL HYSTERECTOMY     still has 1 ovary    APPENDECTOMY     BRAIN SURGERY  1995   Stem surgery, Arnold Chiari Malformation   CATARACT EXTRACTION Bilateral 2017   cataracts, Dr. Elmer Picker   CHOLECYSTECTOMY     COLONOSCOPY     ESOPHAGEAL MANOMETRY N/A 08/06/2019   Procedure: ESOPHAGEAL MANOMETRY (EM);  Surgeon: Iva Boop,  MD;  Location: [facility];  Service: Endoscopy;  Laterality: N/A;   HEMORRHOID BANDING     HERNIA REPAIR     Hital Hernia   KNEE ARTHROSCOPY  05/16/2012   Procedure: ARTHROSCOPY KNEE;  Surgeon: Kennieth Rad, MD;  Location: [facility] OR;  Service: Orthopedics;  Laterality: Left;   KNEE ARTHROSCOPY Right 01/2018   NISSEN FUNDOPLICATION     X 3 lab, open x 2 last with mesh   POLYPECTOMY     REDUCTION MAMMAPLASTY Bilateral 2007   TOTAL KNEE ARTHROPLASTY Right 08/25/2019   Procedure: RIGHT TOTAL KNEE ARTHROPLASTY;  Surgeon: Ollen Gross, MD;  Location: [facility];  Service: Orthopedics;  Laterality: Right;    UPPER GASTROINTESTINAL ENDOSCOPY      Family History  Problem Relation Age of Onset   Diabetes Mother    Rheum arthritis Mother    Hypertension Mother    Hypertension Father    Alcoholism Father    Heart disease Father    Sudden death Father    Rheum arthritis Sister    Hypertension Sister    Arthritis Sister    Rheum arthritis Sister    Rheum arthritis  Sister    CAD Sister        MI age 20   Fibromyalgia Sister    Colon cancer Maternal Grandmother        dx with colon ca laster in life - lived to be 104   Crohn's disease Maternal Aunt    Stomach cancer Maternal Uncle    Lung cancer Maternal Uncle    Breast cancer Neg Hx    Esophageal cancer Neg Hx    Rectal cancer Neg Hx    Prostate cancer Neg Hx    Pancreatic cancer Neg Hx    Colon polyps Neg Hx      Social History   Tobacco Use  Smoking Status Never  Smokeless Tobacco Never  Tobacco Comments   never used tobacco    Social History   Substance and Sexual Activity  Alcohol Use No   Alcohol/week: 0.0 standard drinks of alcohol     Allergies  Allergen Reactions   Morphine And Codeine Anaphylaxis and Shortness Of Breath   Sulfonamide Derivatives Hives    Burn from inside out   Promethazine Hcl Other (See Comments)    Hallucinations    Current Outpatient Medications  Medication Sig Dispense Refill   acetaminophen (TYLENOL) 500 MG tablet Take 1 tablet (500 mg total) by mouth every 6 (six) hours as needed. (Patient taking differently: Take 500 mg by mouth every 6 (six) hours as needed for moderate pain, mild pain or headache.) 30 tablet 0   ACIPHEX 20 MG tablet Take 1 tablet (20 mg total) by mouth 2 (two) times daily. 60 tablet 11   Apoaequorin (PREVAGEN) 10 MG CAPS Take 10 mg by mouth daily. prevagen     cycloSPORINE (RESTASIS) 0.05 % ophthalmic emulsion Place 1 drop into both eyes 2 (two) times daily.     hydrocortisone valerate cream (WESTCORT) 0.2 % Apply 1 Application topically at bedtime.     hyoscyamine (LEVSIN SL) 0.125 MG SL tablet DISSOLVE ONE TABLET UNDER TONGUE EVERY FOUR HOURS AS NEEDED 90 tablet 0   Multiple Vitamins-Minerals (MULTIVITAMIN WITH MINERALS) tablet Take 2 tablets by mouth daily. Vitafusion woman gummy     rosuvastatin (CRESTOR) 10 MG tablet Take 1 tablet (10 mg total) by mouth at bedtime. 90 tablet 1   temazepam (RESTORIL) 30 MG capsule TAKE  ONE CAPSULE BY MOUTH AT BEDTIME AS NEEDED FOR SLEEP 30 capsule 4   triamterene-hydrochlorothiazide (MAXZIDE-25) 37.5-25 MG tablet Take 0.5 tablets by mouth daily. 45 tablet 1   XARELTO 2.5 MG TABS tablet TAKE TWO TABLETS BY MOUTH DAILY 60 tablet 0   No current facility-administered medications for this visit.    Review of Systems  Constitutional:  Negative for malaise/fatigue and weight loss.  Respiratory:  Positive for cough.   Cardiovascular:  Positive for chest pain.  Gastrointestinal:  Positive for abdominal pain, heartburn and vomiting.     PHYSICAL EXAMINATION: BP (!) 159/83 (BP Location: Left Arm, Patient Position: Sitting, Cuff Size: Large)   Pulse 77   Resp 20   Ht 5\' 8"  (1.727 m)   Wt 260 lb (117.9 kg)   SpO2 93% Comment: RA  BMI 39.53 kg/m  Physical Exam Constitutional:      General: She is not in acute distress.    Appearance: Normal appearance. She is not ill-appearing.  HENT:     Head: Normocephalic and atraumatic.  Eyes:     Extraocular Movements: Extraocular movements intact.  Cardiovascular:     Rate and Rhythm: Normal rate.  Abdominal:     General: There is no distension.    Musculoskeletal:        General: Normal range of motion.     Cervical back: Normal range of motion.  Skin:    General: Skin is warm and dry.  Neurological:     General: No focal deficit present.     Mental Status: She is alert and oriented to person, place, and time.     Diagnostic Studies & Laboratory data:    CT Scan:  IMPRESSION: Mild circumferential distal esophageal wall thickening compatible with given history of esophagitis.   Scattered coronary artery disease.   1.7 cm left thyroid nodule. This has been evaluated on previous imaging. (ref: J Am Coll Radiol. 2015 Feb;12(2): 143-50).   EGD/EUS:  - LA Grade C ( one or more mucosal breaks continuous between tops of 2 or more mucosal folds, less than 75% circumference) esophagitis with no bleeding was found 32 to  38 cm from the incisors. Biopsies were taken with a cold forceps for histology. - One benign- appearing, intrinsic mild stenosis was found 38 to 39 cm from the incisors. The stenosis was traversed. A TTS dilator was passed through the scope. Dilation with an 18- 19- 20 mm balloon dilator was performed to 20 mm. The dilation site was examined following endoscope reinsertion and showed mild mucosal disruption. - Evidence of a Nissen fundoplication was found in the cardia. The wrap appeared intact. This was traversed. - The stomach was otherwise normal. - The cardia and gastric fundus were normal on retroflexion.      I have independently reviewed the above radiology studies  and reviewed the findings with the patient.   Recent Lab Findings: Lab Results  Component Value Date   WBC 3.7 (L) 12/27/2022   HGB 12.4 12/27/2022   HCT 39.4 12/27/2022   PLT 237 12/27/2022   GLUCOSE 113 (H) 12/27/2022   CHOL 133 06/05/2022   TRIG 49.0 06/05/2022   HDL 71.80 06/05/2022   LDLCALC 51 06/05/2022   ALT 15 12/27/2022   AST 24 12/27/2022   NA 142 12/27/2022   K 4.2 12/27/2022   CL 103 12/27/2022   CREATININE 0.89 12/27/2022   BUN 15 12/27/2022   CO2 31 12/27/2022   TSH 4.110 12/12/2022   INR 0.9  09/05/2021   HGBA1C 6.3 (H) 12/12/2022       Assessment / Plan:   72yo female with distal esophageal stricture in the setting of 3 hiatal hernia repairs and fundoplication, the last of which was performed with mesh.  She has had multiple dilation in the past, which are now becoming less effective.  I explained to her that surgery would be extremely risky based on the previous op report and the fact that mesh was used for reconstruction at the last repair.  On further questioning, she did not have any dysphagia after her last surgery, thus it possible that her symptoms are due to worsening scar tissue.  I have recommended that she undergo savary dilation on a weekly basis to see if her symptoms improve.   Surgery would only be reserved as a last ditch effort, and give the fact that she has not lost any weight, and appears in good shape, I explained to her that any complication would be worst that her current symptoms.  She is in agreement to proceed with the dilations.     I  spent 60 minutes with the patient face to face counseling and coordination of care.    Corliss Skains 02/02/2023 2:43 PM

## 2023-02-02 NOTE — H&P (View-Only) (Signed)
[address]       [place] [address]             [phone]                    BADIA DERCK [facility] Medical Record #[id number] Date of Birth: 03/21/1951  Referring: Napoleon Form, MD Primary Care: Wanda Plump, MD Primary Cardiologist: None  Chief Complaint:    Chief Complaint  Patient presents with   Esophageal Achalasia    New patient consultation [facility] 5/14, Chest CT 8/21    History of Present Illness:    Jennifer [name] 72 y.o. female presents for surgical evaluation of a distal esophageal stricture.  She has undergone a Nissen fundoplication with hiatal hernia repair on 3 separate occasions via a laparotomy.  Her last surgery was in 2003, where she had extensive lysis of adhesions and a gortex mesh placed to reconstruct her hiatus.  Up until about 10 years ago she was doing well, but then started developing some dysphagia, and odynophagia.  Over the last 5 years her symptoms have worsened.  In 2012, she was seen at [facility], where she was noted to have candidiasis, and a stricture.  She was scheduled to undergo monthly dilation, but stopped after 6 months due to pain.    She currently complains of constant chest pain, and burning with acidic foods.  She regularly has dysphagia, and has to throw up food that gets caught.  Her weight has been stable.  She also complains of significant mucus that she coughs up every morning.      Past Medical History:  Diagnosis Date   Allergy    Anemia    Arthritis    Cataract    bil cataracts removed   Chronic chest pain    Chronic fatigue 02/03/2017   Clotting disorder [facility])    Pulmonary [place] 2010 & 2015   Colon polyps    inflamed partially ulcerated hyperplastic polyp   Diabetes (HCC) 09/24/2013   no meds   Diverticulosis    Dysphagia esophageal phase - chronic after 3 fundoplications 01/20/2014   Edema of both lower extremities    Esophageal stenosis    Stricture after fundoplication REDO    Fibromyalgia    GERD (gastroesophageal reflux disease)    Glaucoma suspect    Heart murmur    no problems    Hemorrhoids, internal, with bleeding 06/27/2017   History of hiatal hernia    Hypertension    IBS (irritable bowel syndrome)    Lactose intolerance    Mitral valve prolapse    OSA (obstructive sleep apnea)    on CPAP-Mild - Sleep study 2004   Pre-diabetes    Pulmonary embolus (HCC)    2010 and 12-2013   Sleep apnea    wears CPAP   Thyroid nodule      dr. watching    Tinnitus    Subjective   [place] 2014   R flank    Past Surgical History:  Procedure Laterality Date   ABDOMINAL HYSTERECTOMY     still has 1 ovary    APPENDECTOMY     BRAIN SURGERY  1995   Stem surgery, Arnold Chiari Malformation   CATARACT EXTRACTION Bilateral 2017   cataracts, Dr. Elmer Picker   CHOLECYSTECTOMY     COLONOSCOPY     ESOPHAGEAL MANOMETRY N/A 08/06/2019   Procedure: ESOPHAGEAL MANOMETRY (EM);  Surgeon: Iva Boop,  MD;  Location: [facility];  Service: Endoscopy;  Laterality: N/A;   HEMORRHOID BANDING     HERNIA REPAIR     Hital Hernia   KNEE ARTHROSCOPY  05/16/2012   Procedure: ARTHROSCOPY KNEE;  Surgeon: Kennieth Rad, MD;  Location: [facility] OR;  Service: Orthopedics;  Laterality: Left;   KNEE ARTHROSCOPY Right 01/2018   NISSEN FUNDOPLICATION     X 3 lab, open x 2 last with mesh   POLYPECTOMY     REDUCTION MAMMAPLASTY Bilateral 2007   TOTAL KNEE ARTHROPLASTY Right 08/25/2019   Procedure: RIGHT TOTAL KNEE ARTHROPLASTY;  Surgeon: Ollen Gross, MD;  Location: [facility];  Service: Orthopedics;  Laterality: Right;    UPPER GASTROINTESTINAL ENDOSCOPY      Family History  Problem Relation Age of Onset   Diabetes Mother    Rheum arthritis Mother    Hypertension Mother    Hypertension Father    Alcoholism Father    Heart disease Father    Sudden death Father    Rheum arthritis Sister    Hypertension Sister    Arthritis Sister    Rheum arthritis Sister    Rheum arthritis  Sister    CAD Sister        MI age 20   Fibromyalgia Sister    Colon cancer Maternal Grandmother        dx with colon ca laster in life - lived to be 104   Crohn's disease Maternal Aunt    Stomach cancer Maternal Uncle    Lung cancer Maternal Uncle    Breast cancer Neg Hx    Esophageal cancer Neg Hx    Rectal cancer Neg Hx    Prostate cancer Neg Hx    Pancreatic cancer Neg Hx    Colon polyps Neg Hx      Social History   Tobacco Use  Smoking Status Never  Smokeless Tobacco Never  Tobacco Comments   never used tobacco    Social History   Substance and Sexual Activity  Alcohol Use No   Alcohol/week: 0.0 standard drinks of alcohol     Allergies  Allergen Reactions   Morphine And Codeine Anaphylaxis and Shortness Of Breath   Sulfonamide Derivatives Hives    Burn from inside out   Promethazine Hcl Other (See Comments)    Hallucinations    Current Outpatient Medications  Medication Sig Dispense Refill   acetaminophen (TYLENOL) 500 MG tablet Take 1 tablet (500 mg total) by mouth every 6 (six) hours as needed. (Patient taking differently: Take 500 mg by mouth every 6 (six) hours as needed for moderate pain, mild pain or headache.) 30 tablet 0   ACIPHEX 20 MG tablet Take 1 tablet (20 mg total) by mouth 2 (two) times daily. 60 tablet 11   Apoaequorin (PREVAGEN) 10 MG CAPS Take 10 mg by mouth daily. prevagen     cycloSPORINE (RESTASIS) 0.05 % ophthalmic emulsion Place 1 drop into both eyes 2 (two) times daily.     hydrocortisone valerate cream (WESTCORT) 0.2 % Apply 1 Application topically at bedtime.     hyoscyamine (LEVSIN SL) 0.125 MG SL tablet DISSOLVE ONE TABLET UNDER TONGUE EVERY FOUR HOURS AS NEEDED 90 tablet 0   Multiple Vitamins-Minerals (MULTIVITAMIN WITH MINERALS) tablet Take 2 tablets by mouth daily. Vitafusion woman gummy     rosuvastatin (CRESTOR) 10 MG tablet Take 1 tablet (10 mg total) by mouth at bedtime. 90 tablet 1   temazepam (RESTORIL) 30 MG capsule TAKE  ONE CAPSULE BY MOUTH AT BEDTIME AS NEEDED FOR SLEEP 30 capsule 4   triamterene-hydrochlorothiazide (MAXZIDE-25) 37.5-25 MG tablet Take 0.5 tablets by mouth daily. 45 tablet 1   XARELTO 2.5 MG TABS tablet TAKE TWO TABLETS BY MOUTH DAILY 60 tablet 0   No current facility-administered medications for this visit.    Review of Systems  Constitutional:  Negative for malaise/fatigue and weight loss.  Respiratory:  Positive for cough.   Cardiovascular:  Positive for chest pain.  Gastrointestinal:  Positive for abdominal pain, heartburn and vomiting.     PHYSICAL EXAMINATION: BP (!) 159/83 (BP Location: Left Arm, Patient Position: Sitting, Cuff Size: Large)   Pulse 77   Resp 20   Ht 5\' 8"  (1.727 m)   Wt 260 lb (117.9 kg)   SpO2 93% Comment: RA  BMI 39.53 kg/m  Physical Exam Constitutional:      General: She is not in acute distress.    Appearance: Normal appearance. She is not ill-appearing.  HENT:     Head: Normocephalic and atraumatic.  Eyes:     Extraocular Movements: Extraocular movements intact.  Cardiovascular:     Rate and Rhythm: Normal rate.  Abdominal:     General: There is no distension.    Musculoskeletal:        General: Normal range of motion.     Cervical back: Normal range of motion.  Skin:    General: Skin is warm and dry.  Neurological:     General: No focal deficit present.     Mental Status: She is alert and oriented to person, place, and time.     Diagnostic Studies & Laboratory data:    CT Scan:  IMPRESSION: Mild circumferential distal esophageal wall thickening compatible with given history of esophagitis.   Scattered coronary artery disease.   1.7 cm left thyroid nodule. This has been evaluated on previous imaging. (ref: J Am Coll Radiol. 2015 Feb;12(2): 143-50).   EGD/EUS:  - LA Grade C ( one or more mucosal breaks continuous between tops of 2 or more mucosal folds, less than 75% circumference) esophagitis with no bleeding was found 32 to  38 cm from the incisors. Biopsies were taken with a cold forceps for histology. - One benign- appearing, intrinsic mild stenosis was found 38 to 39 cm from the incisors. The stenosis was traversed. A TTS dilator was passed through the scope. Dilation with an 18- 19- 20 mm balloon dilator was performed to 20 mm. The dilation site was examined following endoscope reinsertion and showed mild mucosal disruption. - Evidence of a Nissen fundoplication was found in the cardia. The wrap appeared intact. This was traversed. - The stomach was otherwise normal. - The cardia and gastric fundus were normal on retroflexion.      I have independently reviewed the above radiology studies  and reviewed the findings with the patient.   Recent Lab Findings: Lab Results  Component Value Date   WBC 3.7 (L) 12/27/2022   HGB 12.4 12/27/2022   HCT 39.4 12/27/2022   PLT 237 12/27/2022   GLUCOSE 113 (H) 12/27/2022   CHOL 133 06/05/2022   TRIG 49.0 06/05/2022   HDL 71.80 06/05/2022   LDLCALC 51 06/05/2022   ALT 15 12/27/2022   AST 24 12/27/2022   NA 142 12/27/2022   K 4.2 12/27/2022   CL 103 12/27/2022   CREATININE 0.89 12/27/2022   BUN 15 12/27/2022   CO2 31 12/27/2022   TSH 4.110 12/12/2022   INR 0.9  09/05/2021   HGBA1C 6.3 (H) 12/12/2022       Assessment / Plan:   72yo female with distal esophageal stricture in the setting of 3 hiatal hernia repairs and fundoplication, the last of which was performed with mesh.  She has had multiple dilation in the past, which are now becoming less effective.  I explained to her that surgery would be extremely risky based on the previous op report and the fact that mesh was used for reconstruction at the last repair.  On further questioning, she did not have any dysphagia after her last surgery, thus it possible that her symptoms are due to worsening scar tissue.  I have recommended that she undergo savary dilation on a weekly basis to see if her symptoms improve.   Surgery would only be reserved as a last ditch effort, and give the fact that she has not lost any weight, and appears in good shape, I explained to her that any complication would be worst that her current symptoms.  She is in agreement to proceed with the dilations.     I  spent 60 minutes with the patient face to face counseling and coordination of care.    Corliss Skains 02/02/2023 2:43 PM

## 2023-02-05 ENCOUNTER — Other Ambulatory Visit: Payer: Self-pay

## 2023-02-05 ENCOUNTER — Encounter (HOSPITAL_COMMUNITY): Payer: Self-pay | Admitting: Thoracic Surgery (Cardiothoracic Vascular Surgery)

## 2023-02-05 ENCOUNTER — Encounter: Payer: Self-pay | Admitting: Internal Medicine

## 2023-02-05 ENCOUNTER — Ambulatory Visit (INDEPENDENT_AMBULATORY_CARE_PROVIDER_SITE_OTHER): Payer: Medicare Other | Admitting: Internal Medicine

## 2023-02-05 VITALS — BP 136/84 | HR 82 | Temp 98.4°F | Resp 18 | Ht 68.0 in | Wt 259.4 lb

## 2023-02-05 DIAGNOSIS — E119 Type 2 diabetes mellitus without complications: Secondary | ICD-10-CM

## 2023-02-05 DIAGNOSIS — R1310 Dysphagia, unspecified: Secondary | ICD-10-CM | POA: Diagnosis not present

## 2023-02-05 DIAGNOSIS — I10 Essential (primary) hypertension: Secondary | ICD-10-CM

## 2023-02-05 DIAGNOSIS — F419 Anxiety disorder, unspecified: Secondary | ICD-10-CM | POA: Diagnosis not present

## 2023-02-05 NOTE — Progress Notes (Unsigned)
Subjective:    Patient ID: Jennifer [name], female    DOB: 1950/09/04, 72 y.o.   MRN: [id number]  DOS:  02/05/2023 Type of visit - description: Follow-up  For eval of  chronic neck problems. Has ongoing sxs from esophageal stenosis, saw thoracic surgery, note reviewed. She is understandably anxious  about the issue.  BP Readings from Last 3 Encounters:  02/05/23 136/84  02/02/23 (!) 159/83  01/31/23 (!) 148/81   Review of Systems See above   Past Medical History:  Diagnosis Date   Allergy    Anemia    Arthritis    Cataract    bil cataracts removed   Chronic chest pain    Chronic fatigue 02/03/2017   Clotting disorder [facility])    Pulmonary Embolis 2010 & 2015   Colon polyps    inflamed partially ulcerated hyperplastic polyp   Diabetes (HCC) 09/24/2013   no meds   Diverticulosis    Dysphagia esophageal phase - chronic after 3 fundoplications 01/20/2014   Edema of both lower extremities    Esophageal stenosis    Stricture after fundoplication REDO   Fibromyalgia    GERD (gastroesophageal reflux disease)    Glaucoma suspect    Heart murmur    no problems    Hemorrhoids, internal, with bleeding 06/27/2017   History of hiatal hernia    Hypertension    IBS (irritable bowel syndrome)    Lactose intolerance    Mitral valve prolapse    OSA (obstructive sleep apnea)    on CPAP-Mild - Sleep study 2004   Pre-diabetes    Pulmonary embolus (HCC)    2010 and 12-2013   Pulmonary nodule 12/13/2022   seen on CT chest xray on 12/16/22 - left upper lobe   Sleep apnea    wears CPAP   Thyroid nodule      dr. watching    Tinnitus    Subjective   Zoster 2014   R flank    Past Surgical History:  Procedure Laterality Date   ABDOMINAL HYSTERECTOMY     still has 1 ovary    APPENDECTOMY     BRAIN SURGERY  1995   Stem surgery, Arnold Chiari Malformation   CATARACT EXTRACTION Bilateral 2017   cataracts, Dr. Elmer Picker   CHOLECYSTECTOMY     COLONOSCOPY     ESOPHAGEAL MANOMETRY N/A  08/06/2019   Procedure: ESOPHAGEAL MANOMETRY (EM);  Surgeon: Iva Boop, MD;  Location: [facility];  Service: Endoscopy;  Laterality: N/A;   HEMORRHOID BANDING     HERNIA REPAIR     Hital Hernia   KNEE ARTHROSCOPY  05/16/2012   Procedure: ARTHROSCOPY KNEE;  Surgeon: Kennieth Rad, MD;  Location: [facility] OR;  Service: Orthopedics;  Laterality: Left;   KNEE ARTHROSCOPY Right 01/2018   NISSEN FUNDOPLICATION     X 3 lab, open x 2 last with mesh   POLYPECTOMY     REDUCTION MAMMAPLASTY Bilateral 2007   TOTAL KNEE ARTHROPLASTY Right 08/25/2019   Procedure: RIGHT TOTAL KNEE ARTHROPLASTY;  Surgeon: Ollen Gross, MD;  Location: [facility] ORS;  Service: Orthopedics;  Laterality: Right;    UPPER GASTROINTESTINAL ENDOSCOPY      Current Outpatient Medications  Medication Instructions   acetaminophen (TYLENOL) 500 mg, Oral, Every 6 hours PRN   Aciphex 20 mg, Oral, 2 times daily   cycloSPORINE (RESTASIS) 0.05 % ophthalmic emulsion 1 drop, Both Eyes, 2 times daily   hydrocortisone valerate cream (WESTCORT) 0.2 % 1 Application, Topical, Daily  at bedtime   hyoscyamine (LEVSIN SL) 0.125 MG SL tablet DISSOLVE ONE TABLET UNDER TONGUE EVERY FOUR HOURS AS NEEDED   Multiple Vitamins-Minerals (MULTIVITAMIN WITH MINERALS) tablet 2 tablets, Oral, Daily, Vitafusion woman gummy   Prevagen 10 mg, Oral, Daily, prevagen    rosuvastatin (CRESTOR) 10 mg, Oral, Daily at bedtime   temazepam (RESTORIL) 30 mg, Oral, At bedtime PRN, for sleep   triamterene-hydrochlorothiazide (MAXZIDE-25) 37.5-25 MG tablet 0.5 tablets, Oral, Daily   XARELTO 2.5 MG TABS tablet TAKE TWO TABLETS BY MOUTH DAILY       Objective:   Physical Exam BP 136/84   Pulse 82   Temp 98.4 F (36.9 C) (Oral)   Resp 18   Ht 5\' 8"  (1.727 m)   Wt 259 lb 6 oz (117.7 kg)   SpO2 95%   BMI 39.44 kg/m  General:   Well developed, NAD, BMI noted. HEENT:  Normocephalic . Face symmetric, atraumatic Lungs:  CTA B Normal respiratory effort, no  intercostal retractions, no accessory muscle use. Heart: RRR,  no murmur.  Lower extremities: no pretibial edema bilaterally  Skin: Not pale. Not jaundice Neurologic:  alert & oriented X3.  Speech normal, gait appropriate for age and unassisted Psych--  Cognition and judgment appear intact.  Cooperative with normal attention span and concentration.  Behavior appropriate. Tearful at times during the visit      Assessment     Assessment   DM, + neuropathy  HTN Anxiety, insomnia: On restoril prn  Pulmonary: --OSA, sleep study 2004, on a CPAP --PEs:2010 and 12-2013 - saw hematology 12-2014, rec anticoag x 2 years (until 12-2015), then continue with a low-dose xarelto   life -- multiple pulmonary nodule, last CT 11-2013: No further imaging suggested --RAD, inhalers rarely Fibromyalgia CV:  MVP,  carotid US 1-39%s GI: --GERD,chronic dysphagia s/p  fundoplication 3, esophageal stenosis- - April 2021: Barium study, esophageal manometry (see report) -- IBS --Hemorrhoids: S/p banding ~/2019 -- chronic right flank,RUQ pain : since GB surgery 2008 Etiology not clear but likely multifactorial --> Adhesions from the surgery, neuropathy (DM), postherpetic neuralgia, scarring from PE, etc.  MRI T spine done 06-2014: DJD  Thyroid nodule: Bx (non neoplastic goiter) 2014,  [place] 05-2016, 2022 (stable) Zoster --  2014 right flank   PLAN DM, A1c 2 months ago was 6.3, continue present care. HTN: Currently on Maxide.  BP has been elevated a couple times but is good today.  We agreed to check at home, goals provided. Anxiety, insomnia: She is worried about her esophageal strictures, listening therapy provided, recommend to see a professional counselor, will reach out to me if medication is needed other than restoril GERD, esophageal stenosis.  Saw cardiothoracic surgery, they are planning to do a series of EGDs with dilatation.  She will be high risk for further surgeries. L leg discomfort: See last visit,  report issue is better  Preventive care reviewed: - Td 2024 -  zostavax 2014 , s/p shingrex x 2 - PNM 23: 2017, 2020; prevnar 2017 - s/p RSV - had a flu shot and  COVID booster October 2024 UTD. - Female care: Recommend to see gynecology; [facility]G 04/2022 ([facility]) - CCS: Colonoscopy 2009, cscope 11-2015, + polyp, C-scope 01/17/2021, next per GI -DEXA wnl 04/2017  RTC 4 months.

## 2023-02-05 NOTE — Patient Instructions (Signed)
Continue checking your blood pressure regularly Blood pressure goal:  between 110/65 and  135/85. If it is consistently higher or lower, let me know     Next visit with me in 4 months for a checkup. Please schedule it at the front desk

## 2023-02-06 ENCOUNTER — Ambulatory Visit (HOSPITAL_COMMUNITY): Payer: Medicare Other | Admitting: Physician Assistant

## 2023-02-06 ENCOUNTER — Encounter (HOSPITAL_COMMUNITY)
Admission: RE | Disposition: A | Discharge status: Home / Self Care | Payer: Self-pay | Source: Home / Self Care | Attending: Thoracic Surgery (Cardiothoracic Vascular Surgery)

## 2023-02-06 ENCOUNTER — Ambulatory Visit (HOSPITAL_BASED_OUTPATIENT_CLINIC_OR_DEPARTMENT_OTHER): Payer: Self-pay | Admitting: Physician Assistant

## 2023-02-06 ENCOUNTER — Encounter (HOSPITAL_COMMUNITY): Payer: Self-pay | Admitting: Thoracic Surgery (Cardiothoracic Vascular Surgery)

## 2023-02-06 ENCOUNTER — Ambulatory Visit (HOSPITAL_COMMUNITY)
Admission: RE | Admit: 2023-02-06 | Discharge: 2023-02-06 | Disposition: A | Discharge status: Home / Self Care | Payer: Medicare Other | Attending: Thoracic Surgery (Cardiothoracic Vascular Surgery) | Admitting: Thoracic Surgery (Cardiothoracic Vascular Surgery)

## 2023-02-06 ENCOUNTER — Ambulatory Visit (HOSPITAL_COMMUNITY): Payer: Medicare Other

## 2023-02-06 DIAGNOSIS — K222 Esophageal obstruction: Secondary | ICD-10-CM

## 2023-02-06 DIAGNOSIS — E119 Type 2 diabetes mellitus without complications: Secondary | ICD-10-CM | POA: Diagnosis not present

## 2023-02-06 DIAGNOSIS — I10 Essential (primary) hypertension: Secondary | ICD-10-CM

## 2023-02-06 DIAGNOSIS — I341 Nonrheumatic mitral (valve) prolapse: Secondary | ICD-10-CM | POA: Diagnosis not present

## 2023-02-06 DIAGNOSIS — G4733 Obstructive sleep apnea (adult) (pediatric): Secondary | ICD-10-CM | POA: Diagnosis not present

## 2023-02-06 DIAGNOSIS — Z6839 Body mass index (BMI) 39.0-39.9, adult: Secondary | ICD-10-CM | POA: Insufficient documentation

## 2023-02-06 DIAGNOSIS — R1010 Upper abdominal pain, unspecified: Secondary | ICD-10-CM

## 2023-02-06 DIAGNOSIS — Z9889 Other specified postprocedural states: Secondary | ICD-10-CM | POA: Insufficient documentation

## 2023-02-06 HISTORY — PX: ESOPHAGOGASTRODUODENOSCOPY: SHX5428

## 2023-02-06 HISTORY — PX: ESOPHAGEAL DILATION: SHX303

## 2023-02-06 LAB — COMPREHENSIVE METABOLIC PANEL
ALT: 20 U/L (ref 0–44)
AST: 22 U/L (ref 15–41)
Albumin: 3.9 g/dL (ref 3.5–5.0)
Alkaline Phosphatase: 52 U/L (ref 38–126)
Anion gap: 12 (ref 5–15)
BUN: 14 mg/dL (ref 8–23)
CO2: 24 mmol/L (ref 22–32)
Calcium: 9.1 mg/dL (ref 8.9–10.3)
Chloride: 103 mmol/L (ref 98–111)
Creatinine, Ser: 0.89 mg/dL (ref 0.44–1.00)
GFR, Estimated: >60 mL/min (ref >60)
Glucose, Bld: 110 mg/dL — ABNORMAL HIGH (ref 70–99)
Potassium: 3.4 mmol/L — ABNORMAL LOW (ref 3.5–5.1)
Sodium: 139 mmol/L (ref 135–145)
Total Bilirubin: 0.6 mg/dL (ref 0.3–1.2)
Total Protein: 7.7 g/dL (ref 6.5–8.1)

## 2023-02-06 LAB — APTT: aPTT: 30 s (ref 24–36)

## 2023-02-06 LAB — SURGICAL PCR SCREEN
MRSA, PCR: NEGATIVE
Staphylococcus aureus: NEGATIVE

## 2023-02-06 LAB — CBC
HCT: 40 % (ref 36.0–46.0)
Hemoglobin: 12.8 g/dL (ref 12.0–15.0)
MCH: 28.2 pg (ref 26.0–34.0)
MCHC: 32 g/dL (ref 30.0–36.0)
MCV: 88.1 fL (ref 80.0–100.0)
Platelets: 226 10*3/uL (ref 150–400)
RBC: 4.54 MIL/uL (ref 3.87–5.11)
RDW: 14.7 % (ref 11.5–15.5)
WBC: 5.1 10*3/uL (ref 4.0–10.5)
nRBC: 0 % (ref 0.0–0.2)

## 2023-02-06 LAB — GLUCOSE, CAPILLARY
Glucose-Capillary: 109 mg/dL — ABNORMAL HIGH (ref 70–99)
Glucose-Capillary: 106 mg/dL — ABNORMAL HIGH (ref 70–99)
Glucose-Capillary: 114 mg/dL — ABNORMAL HIGH (ref 70–99)

## 2023-02-06 LAB — PROTIME-INR
INR: 0.9 (ref 0.8–1.2)
Prothrombin Time: 12.3 s (ref 11.4–15.2)

## 2023-02-06 SURGERY — EGD (ESOPHAGOGASTRODUODENOSCOPY)
Anesthesia: General | Site: Mouth

## 2023-02-06 MED ORDER — SODIUM CHLORIDE 0.9 % IV SOLN
INTRAVENOUS | Status: DC | PRN
Start: 2023-02-06 — End: 2023-02-06

## 2023-02-06 MED ORDER — ONDANSETRON HCL 4 MG/2ML IJ SOLN: 4.0000 mg | Freq: Four times a day (QID) | INTRAMUSCULAR | Status: DC | PRN

## 2023-02-06 MED ORDER — PROPOFOL 10 MG/ML IV BOLUS
INTRAVENOUS | Status: AC
  Filled 2023-02-06: qty 20

## 2023-02-06 MED ORDER — OXYCODONE HCL 5 MG/5ML PO SOLN: 5.0000 mg | Freq: Once | ORAL | Status: DC | PRN

## 2023-02-06 MED ORDER — DEXAMETHASONE SODIUM PHOSPHATE 10 MG/ML IJ SOLN
INTRAMUSCULAR | Status: AC
  Filled 2023-02-06: qty 1

## 2023-02-06 MED ORDER — ONDANSETRON HCL 4 MG/2ML IJ SOLN
INTRAMUSCULAR | Status: AC
  Filled 2023-02-06: qty 2

## 2023-02-06 MED ORDER — CHLORHEXIDINE GLUCONATE 0.12 % MT SOLN
15.0000 mL | OROMUCOSAL | Status: AC
  Administered 2023-02-06: 15 mL via OROMUCOSAL
  Filled 2023-02-06: qty 15

## 2023-02-06 MED ORDER — ONDANSETRON HCL 4 MG/2ML IJ SOLN
INTRAMUSCULAR | Status: DC | PRN
  Administered 2023-02-06: 4 mg via INTRAVENOUS

## 2023-02-06 MED ORDER — INSULIN ASPART 100 UNIT/ML IJ SOLN: 0.0000 [IU] | INTRAMUSCULAR | Status: DC | PRN

## 2023-02-06 MED ORDER — ROCURONIUM BROMIDE 10 MG/ML (PF) SYRINGE
PREFILLED_SYRINGE | INTRAVENOUS | Status: DC | PRN
  Administered 2023-02-06: 40 mg via INTRAVENOUS

## 2023-02-06 MED ORDER — LIDOCAINE 2% (20 MG/ML) 5 ML SYRINGE
INTRAMUSCULAR | Status: AC
  Filled 2023-02-06: qty 5

## 2023-02-06 MED ORDER — FENTANYL CITRATE (PF) 100 MCG/2ML IJ SOLN: 25.0000 ug | INTRAMUSCULAR | Status: DC | PRN

## 2023-02-06 MED ORDER — FENTANYL CITRATE (PF) 250 MCG/5ML IJ SOLN
INTRAMUSCULAR | Status: AC
  Filled 2023-02-06: qty 5

## 2023-02-06 MED ORDER — PROPOFOL 10 MG/ML IV BOLUS
INTRAVENOUS | Status: DC | PRN
  Administered 2023-02-06 (×2): 40 mg via INTRAVENOUS
  Administered 2023-02-06: 200 mg via INTRAVENOUS
  Administered 2023-02-06: 50 mg via INTRAVENOUS

## 2023-02-06 MED ORDER — OXYCODONE HCL 5 MG PO TABS: 5.0000 mg | ORAL_TABLET | Freq: Once | ORAL | Status: DC | PRN

## 2023-02-06 MED ORDER — 0.9 % SODIUM CHLORIDE (POUR BTL) OPTIME
TOPICAL | Status: DC | PRN
  Administered 2023-02-06: 1000 mL

## 2023-02-06 MED ORDER — LIDOCAINE 2% (20 MG/ML) 5 ML SYRINGE
INTRAMUSCULAR | Status: DC | PRN
  Administered 2023-02-06: 60 mg via INTRAVENOUS

## 2023-02-06 MED ORDER — DEXAMETHASONE SODIUM PHOSPHATE 10 MG/ML IJ SOLN
INTRAMUSCULAR | Status: DC | PRN
  Administered 2023-02-06: 10 mg via INTRAVENOUS

## 2023-02-06 MED ORDER — FENTANYL CITRATE (PF) 250 MCG/5ML IJ SOLN
INTRAMUSCULAR | Status: DC | PRN
  Administered 2023-02-06: 50 ug via INTRAVENOUS
  Administered 2023-02-06: 100 ug via INTRAVENOUS

## 2023-02-06 MED ORDER — SUGAMMADEX SODIUM 200 MG/2ML IV SOLN
INTRAVENOUS | Status: DC | PRN
  Administered 2023-02-06: 300 mg via INTRAVENOUS

## 2023-02-06 MED ORDER — ROCURONIUM BROMIDE 10 MG/ML (PF) SYRINGE
PREFILLED_SYRINGE | INTRAVENOUS | Status: AC
  Filled 2023-02-06: qty 10

## 2023-02-06 SURGICAL SUPPLY — 22 items
BUTTON OLYMPUS DEFENDO 5 PIECE (MISCELLANEOUS) ×1 IMPLANT
CANISTER SUCT 3000ML PPV (MISCELLANEOUS) ×1 IMPLANT
CNTNR URN SCR LID CUP LEK RST (MISCELLANEOUS) ×1 IMPLANT
CONT SPEC 4OZ STRL OR WHT (MISCELLANEOUS) ×1
COVER BACK TABLE 60X90IN (DRAPES) IMPLANT
GAUZE SPONGE 4X4 12PLY STRL (GAUZE/BANDAGES/DRESSINGS) ×1 IMPLANT
GLOVE BIO SURGEON STRL SZ7 (GLOVE) ×1 IMPLANT
GLOVE BIO SURGEON STRL SZ7.5 (GLOVE) ×1 IMPLANT
GOWN STRL REUS W/ TWL XL LVL3 (GOWN DISPOSABLE) ×1 IMPLANT
GOWN STRL REUS W/TWL XL LVL3 (GOWN DISPOSABLE) ×1
MARKER SKIN DUAL TIP RULER LAB (MISCELLANEOUS) ×1 IMPLANT
NS IRRIG 1000ML POUR BTL (IV SOLUTION) ×1 IMPLANT
OIL SILICONE PENTAX (PARTS (SERVICE/REPAIRS)) IMPLANT
PAD ARMBOARD 7.5X6 YLW CONV (MISCELLANEOUS) ×2 IMPLANT
SYR 20ML ECCENTRIC (SYRINGE) ×1 IMPLANT
TOWEL GREEN STERILE (TOWEL DISPOSABLE) ×1 IMPLANT
TOWEL GREEN STERILE FF (TOWEL DISPOSABLE) ×1 IMPLANT
TUBE CONNECTING 20X1/4 (TUBING) ×1 IMPLANT
TUBING ENDO SMARTCAP (MISCELLANEOUS) ×1 IMPLANT
UNDERPAD 30X36 HEAVY ABSORB (UNDERPADS AND DIAPERS) ×1 IMPLANT
WATER STERILE IRR 1000ML POUR (IV SOLUTION) ×1 IMPLANT
WIRE HYDRA 450CM (MISCELLANEOUS) IMPLANT

## 2023-02-06 NOTE — Assessment & Plan Note (Signed)
DM, A1c 2 months ago was 6.3, continue present care. HTN: Currently on Maxide.  BP has been elevated a couple times but is good today.  We agreed to check at home, goals provided. Anxiety, insomnia: She is worried about her esophageal strictures, listening therapy provided, recommend to see a professional counselor, will reach out to me if medication is needed other than restoril GERD, esophageal stenosis.  Saw cardiothoracic surgery, they are planning to do a series of EGDs with dilatation.  She will be high risk for further surgeries. L leg discomfort: See last visit, report issue is better  Preventive care reviewed  RTC 4 months.

## 2023-02-06 NOTE — Anesthesia Procedure Notes (Signed)
Procedure Name: Intubation Date/Time: 02/06/2023 12:31 PM  Performed by: Cheree Ditto, CRNAPre-anesthesia Checklist: Patient identified, Emergency Drugs available, Suction available and Patient being monitored Patient Re-evaluated:Patient Re-evaluated prior to induction Oxygen Delivery Method: Circle system utilized Preoxygenation: Pre-oxygenation with 100% oxygen Induction Type: IV induction Ventilation: Mask ventilation without difficulty Laryngoscope Size: Mac Grade View: Grade II Tube type: Oral Number of attempts: 1 Airway Equipment and Method: Stylet and Oral airway Placement Confirmation: ETT inserted through vocal cords under direct vision, positive ETCO2 and breath sounds checked- equal and bilateral Secured at: 22 cm Tube secured with: Tape Dental Injury: Teeth and Oropharynx as per pre-operative assessment

## 2023-02-06 NOTE — Progress Notes (Signed)
No need for COVID test prior to today's procedure per Dr. Cliffton Asters and Dr. Chaney Malling

## 2023-02-06 NOTE — Discharge Instructions (Signed)
CRUSH ALL MEDICATIONS AND TAKE IN APPLESAUCE FOR 1 WEEK

## 2023-02-06 NOTE — Discharge Summary (Signed)
Physician Discharge Summary   Patient ID: Jennifer [name] [id number] 72 y.o. 1950/08/28  Admit date: 02/06/2023  Discharge date and time: No discharge date for patient encounter.   Admitting Physician: Corliss Skains, MD   Discharge Physician: Corliss Skains   Admission Diagnoses: esophageal stricture  Discharge Diagnoses: esophageal stricture  Admission Condition: good  Discharged Condition: good   Hospital Course: uncomplicated Disposition:   Patient Instructions:  Allergies as of 02/06/2023       Reactions   Morphine And Codeine Anaphylaxis, Shortness Of Breath   Sulfonamide Derivatives Hives   Burn from inside out   Promethazine Hcl Other (See Comments)   Hallucinations        Medication List     TAKE these medications    acetaminophen 500 MG tablet Commonly known as: TYLENOL Take 1 tablet (500 mg total) by mouth every 6 (six) hours as needed. What changed: reasons to take this   Aciphex 20 MG tablet Generic drug: RABEprazole Take 1 tablet (20 mg total) by mouth 2 (two) times daily.   hydrocortisone valerate cream 0.2 % Commonly known as: WESTCORT Apply 1 Application topically at bedtime.   hyoscyamine 0.125 MG SL tablet Commonly known as: LEVSIN SL DISSOLVE ONE TABLET UNDER TONGUE EVERY FOUR HOURS AS NEEDED   multivitamin with minerals tablet Take 2 tablets by mouth daily. Vitafusion woman gummy   Prevagen 10 MG Caps Generic drug: Apoaequorin Take 10 mg by mouth daily. prevagen   Restasis 0.05 % ophthalmic emulsion Generic drug: cycloSPORINE Place 1 drop into both eyes 2 (two) times daily.   rosuvastatin 10 MG tablet Commonly known as: Crestor Take 1 tablet (10 mg total) by mouth at bedtime.   temazepam 30 MG capsule Commonly known as: RESTORIL TAKE ONE CAPSULE BY MOUTH AT BEDTIME AS NEEDED FOR SLEEP   triamterene-hydrochlorothiazide 37.5-25 MG tablet Commonly known as: MAXZIDE-25 Take 0.5 tablets by mouth daily.    Xarelto 2.5 MG Tabs tablet Generic drug: rivaroxaban TAKE TWO TABLETS BY MOUTH DAILY       Activity: activity as tolerated Diet:  soft mechanical diet Wound Care: none needed  Follow-up with Dr. Cliffton Asters in 1 week.  SignedCorliss Skains 02/06/2023 1:31 PM

## 2023-02-06 NOTE — Assessment & Plan Note (Signed)
Preventive care reviewed: - Td 2024 -  zostavax 2014 , s/p shingrex x 2 - PNM 23: 2017, 2020; prevnar 2017 - s/p RSV - had a flu shot and  COVID booster October 2024 UTD. - Female care: Recommend to see gynecology; [facility]G 04/2022 ([facility]) - CCS: Colonoscopy 2009, cscope 11-2015, + polyp, C-scope 01/17/2021, next per GI

## 2023-02-06 NOTE — Transfer of Care (Signed)
Immediate Anesthesia Transfer of Care Note  Patient: Jennifer [name]  Procedure(s) Performed: ESOPHAGOGASTRODUODENOSCOPY (EGD) (Mouth) ESOPHAGEAL DILATION (Mouth)  Patient Location: PACU  Anesthesia Type:General  Level of Consciousness: drowsy and patient cooperative  Airway & Oxygen Therapy: Patient Spontanous Breathing and Patient connected to face mask oxygen  Post-op Assessment: Report given to RN and Post -op Vital signs reviewed and stable  Post vital signs: Reviewed and stable  Last Vitals:  Vitals Value Taken Time  BP 151/89 1331 02/06/23  Temp    Pulse 97 1331 02/06/23  Resp 10 1331 02/06/23  SpO2 97 1331 02/06/23    Last Pain:  Vitals:   02/06/23 1033  TempSrc:   PainSc: 0-No pain      Patients Stated Pain Goal: 0 (02/06/23 1033)  Complications: No notable events documented.

## 2023-02-06 NOTE — Anesthesia Preprocedure Evaluation (Addendum)
Anesthesia Evaluation  Patient identified by MRN, date of birth, ID band Patient awake    Reviewed: Allergy & Precautions, H&P , NPO status , Patient's Chart, lab work & pertinent test results  Airway Mallampati: II   Neck ROM: full    Dental   Pulmonary sleep apnea    breath sounds clear to auscultation       Cardiovascular hypertension, + Valvular Problems/Murmurs MVP  Rhythm:regular Rate:Normal     Neuro/Psych  Headaches PSYCHIATRIC DISORDERS Anxiety        GI/Hepatic hiatal hernia,GERD  ,,S/p Nissen Esophageal stricture   Endo/Other  diabetes, Type 2  Morbid obesity  Renal/GU      Musculoskeletal  (+) Arthritis ,  Fibromyalgia -  Abdominal   Peds  Hematology   Anesthesia Other Findings   Reproductive/Obstetrics                             Anesthesia Physical Anesthesia Plan  ASA: 3  Anesthesia Plan: General   Post-op Pain Management:    Induction: Intravenous  PONV Risk Score and Plan: 3 and Propofol infusion, Treatment may vary due to age or medical condition, Ondansetron and Midazolam  Airway Management Planned: Oral ETT  Additional Equipment:   Intra-op Plan:   Post-operative Plan: Extubation in OR  Informed Consent: I have reviewed the patients History and Physical, chart, labs and discussed the procedure including the risks, benefits and alternatives for the proposed anesthesia with the patient or authorized representative who has indicated his/her understanding and acceptance.     Dental advisory given  Plan Discussed with: CRNA, Anesthesiologist and Surgeon  Anesthesia Plan Comments:        Anesthesia Quick Evaluation

## 2023-02-06 NOTE — Op Note (Signed)
      [address]       [place] [address]             [phone]        02/06/2023  Patient:  Jennifer [name] Pre-Op Dx: Esophageal Stricture Hx of hiatal hernia repair with fundoplication X 3   Post-op Dx:  same Procedure: - Esophagogastroscopy - Savary dilation of the esophagus to 54 F/68mm   Surgeon and Role:      * Murray Guzzetta, Eliezer Lofts, MD - Primary   Anesthesia  general EBL:  minimal  Blood Administration: none Specimen:  none   Counts: correct   Indications: 72yo female with distal esophageal stricture in the setting of 3 hiatal hernia repairs and fundoplication, the last of which was performed with mesh. She has had multiple dilation in the past, which are now becoming less effective. I explained to her that surgery would be extremely risky based on the previous op report and the fact that mesh was used for reconstruction at the last repair. On further questioning, she did not have any dysphagia after her last surgery, thus it possible that her symptoms are due to worsening scar tissue. I have recommended that she undergo savary dilation on a weekly basis to see if her symptoms improve. Surgery would only be reserved as a last ditch effort, and give the fact that she has not lost any weight, and appears in good shape, I explained to her that any complication would be worst that her current symptoms. She is in agreement to proceed with the dilations.   Findings: I was able to pass into the stomach without any obstruction.  There were some white plaques noted in the esophagus and stomach.  We dilated up to a 58F without much resistance.  Post dilation there was scant blood at the GE junction.  Operative Technique: After the risks, benefits and alternatives were thoroughly discussed, the patient was brought to the operative theatre.  Anesthesia was induced. The patient was prepped and draped in normal sterile fashion.  An appropriate surgical pause was  performed, and pre-operative antibiotics were dosed accordingly.  The gastroscope was advanced through the oropharynx into the cervical esophagus under direct visualization.  The scope was passed into the stomach.  The scope was then pulled back, and the esophageal mucosa was visualized.  A mucosal was  normal appearing with some white plaques.  Next a Jag wire was passed through the gastroscope into the stomach with fluoroscopic guidance.  Savary dilators were used under fluoroscopic visualization.  We started with a 67F and dilated up to 58F without meeting any resistance.    The gastroscope was then placed, and the esophagus was visualized.  Mild bleeding was evident.     The patient tolerated the procedure without any immediate complications, and was transferred to the PACU in stable condition.  Ketan Renz Keane Scrape

## 2023-02-06 NOTE — Interval H&P Note (Signed)
History and Physical Interval Note:  02/06/2023 12:16 PM  Jennifer [name]  has presented today for surgery, with the diagnosis of esophageal stricture.  The various methods of treatment have been discussed with the patient and family. After consideration of risks, benefits and other options for treatment, the patient has consented to  Procedure(s) with comments: ESOPHAGOGASTRODUODENOSCOPY (EGD) (N/A) - With Savary dilation as a surgical intervention.  The patient's history has been reviewed, patient examined, no change in status, stable for surgery.  I have reviewed the patient's chart and labs.  Questions were answered to the patient's satisfaction.     Chinyere Galiano Keane Scrape

## 2023-02-07 ENCOUNTER — Encounter: Payer: Self-pay | Admitting: *Deleted

## 2023-02-07 ENCOUNTER — Other Ambulatory Visit: Payer: Self-pay | Admitting: *Deleted

## 2023-02-07 DIAGNOSIS — K222 Esophageal obstruction: Secondary | ICD-10-CM

## 2023-02-07 NOTE — Anesthesia Postprocedure Evaluation (Signed)
Anesthesia Post Note  Patient: Jennifer [name]  Procedure(s) Performed: ESOPHAGOGASTRODUODENOSCOPY (EGD) (Mouth) ESOPHAGEAL DILATION (Mouth)     Patient location during evaluation: PACU Anesthesia Type: General Level of consciousness: awake and alert Pain management: pain level controlled Vital Signs Assessment: post-procedure vital signs reviewed and stable Respiratory status: spontaneous breathing, nonlabored ventilation, respiratory function stable and patient connected to nasal cannula oxygen Cardiovascular status: blood pressure returned to baseline and stable Postop Assessment: no apparent nausea or vomiting Anesthetic complications: no   No notable events documented.  Last Vitals:  Vitals:   02/06/23 1345 02/06/23 1400  BP: 128/63 110/62  Pulse: 90 80  Resp: 19 10  Temp:  36.5 C  SpO2: 95% 94%    Last Pain:  Vitals:   02/06/23 1400  TempSrc:   PainSc: 0-No pain                 Tanija Germani S

## 2023-02-08 ENCOUNTER — Encounter (HOSPITAL_COMMUNITY): Payer: Self-pay | Admitting: Thoracic Surgery (Cardiothoracic Vascular Surgery)

## 2023-02-09 ENCOUNTER — Ambulatory Visit (INDEPENDENT_AMBULATORY_CARE_PROVIDER_SITE_OTHER): Payer: Medicare Other | Admitting: Thoracic Surgery (Cardiothoracic Vascular Surgery)

## 2023-02-09 DIAGNOSIS — K222 Esophageal obstruction: Secondary | ICD-10-CM

## 2023-02-09 NOTE — Progress Notes (Signed)
     [address]       [place] [address]             [phone]       Patient: Home Provider: Office Consent for Telemedicine visit obtained.  Today's visit was completed via a real-time telehealth (see specific modality noted below). The patient/authorized person provided oral consent at the time of the visit to engage in a telemedicine encounter with the present provider at [facility]. The patient/authorized person was informed of the potential benefits, limitations, and risks of telemedicine. The patient/authorized person expressed understanding that the laws that protect confidentiality also apply to telemedicine. The patient/authorized person acknowledged understanding that telemedicine does not provide emergency services and that he or she would need to call 911 or proceed to the nearest hospital for help if such a need arose.   Total time spent in the clinical discussion 10 minutes.  Telehealth Modality: Phone visit (audio only)  I had a telephone visit with Jennifer [name] She is tolerating some solid food with mild dysphagia.  She continue to have burning with swallowing.  She is set to undergo another EGD with dilation on Tuesday.

## 2023-02-12 ENCOUNTER — Encounter (HOSPITAL_COMMUNITY): Payer: Self-pay | Admitting: Thoracic Surgery (Cardiothoracic Vascular Surgery)

## 2023-02-12 ENCOUNTER — Other Ambulatory Visit: Payer: Self-pay

## 2023-02-12 NOTE — Progress Notes (Signed)
SDW CALL  Patient was not available. Horace Schmale,herspouse and designated party was given pre-op instructions over the phone. The opportunity was given for the Mr. Mannarino  to ask questions. No further questions asked. Mr. Pelz verbalized understanding of instructions given.   PCP - Jose Paz,MD Cardiologist - deneis  PPM/ICD - denies Device Orders -  Rep Notified -   Chest x-ray - DOS EKG - 12/12/22 Stress Test - denies ECHO - 10/31/04 Cardiac Cath - denies  Sleep Study - 2004 CPAP - yes  Fasting Blood Sugar - pt doesn't check her sugar Checks Blood Sugar _____ times a day  Blood Thinner Instructions: According to pt's husband,Horace,pt's last dose of Xarelto was 10/20 as instructed by Dr. Cliffton Asters. Aspirin Instructions:na   ERAS Protcol -no PRE-SURGERY Ensure or G2-   COVID TEST- na   Anesthesia review: yes  Patient's husband denies patient having shortness of breath, fever, cough and chest pain over the phone call    Surgical Instructions    Your procedure is scheduled on October 22  Report to [facility] Main Entrance "A" at 1020 A.M., then check in with the Admitting office.  Call this number if you have problems the morning of surgery:  [phone]    Remember:  Do not eat or drink anything after midnight the night before your surgery   Take these medicines the morning of surgery with A SIP OF WATER: Aciphex,Restasis eye drops. PRN- Tylenol,Levsin SL   As of today, STOP taking any Aspirin (unless otherwise instructed by your surgeon) Aleve, Naproxen, Ibuprofen, Motrin, Advil, Goody's, BC's, all herbal medications, fish oil, and all vitamins.  [facility] is not responsible for any belongings or valuables. .   Do NOT Smoke (Tobacco/Vaping)  24 hours prior to your procedure  If you use a CPAP at night, you may bring your mask for your overnight stay.   Contacts, glasses, hearing aids, dentures or partials may not be worn into surgery, please bring cases  for these belongings   Patients discharged the day of surgery will not be allowed to drive home, and someone needs to stay with them for 24 hours.  Special instructions:    Oral Hygiene is also important to reduce your risk of infection.  Remember - BRUSH YOUR TEETH THE MORNING OF SURGERY WITH YOUR REGULAR TOOTHPASTE   Day of Surgery:  Take a shower the day of or night before with antibacterial soap. Wear Clean/Comfortable clothing the morning of surgery Do not apply any deodorants/lotions.   Do not wear jewelry or makeup Do not wear lotions, powders, perfumes/colognes, or deodorant. Do not shave 48 hours prior to surgery.  Men may shave face and neck. Do not bring valuables to the hospital. Do not wear nail polish, gel polish, artificial nails, or any other type of covering on natural nails (fingers and toes) If you have artificial nails or gel coating that need to be removed by a nail salon, please have this removed prior to surgery. Artificial nails or gel coating may interfere with anesthesia's ability to adequately monitor your vital signs. Remember to brush your teeth WITH YOUR REGULAR TOOTHPASTE.

## 2023-02-13 ENCOUNTER — Ambulatory Visit (HOSPITAL_BASED_OUTPATIENT_CLINIC_OR_DEPARTMENT_OTHER): Payer: Medicare Other | Admitting: Certified Registered Nurse Anesthetist

## 2023-02-13 ENCOUNTER — Ambulatory Visit (HOSPITAL_COMMUNITY): Payer: Medicare Other

## 2023-02-13 ENCOUNTER — Ambulatory Visit (HOSPITAL_COMMUNITY): Payer: Medicare Other | Admitting: Certified Registered Nurse Anesthetist

## 2023-02-13 ENCOUNTER — Encounter (HOSPITAL_COMMUNITY): Payer: Self-pay | Admitting: Thoracic Surgery (Cardiothoracic Vascular Surgery)

## 2023-02-13 ENCOUNTER — Ambulatory Visit (HOSPITAL_COMMUNITY)
Admission: RE | Admit: 2023-02-13 | Discharge: 2023-02-13 | Disposition: A | Discharge status: Home / Self Care | Payer: Medicare Other | Attending: Thoracic Surgery (Cardiothoracic Vascular Surgery) | Admitting: Thoracic Surgery (Cardiothoracic Vascular Surgery)

## 2023-02-13 ENCOUNTER — Other Ambulatory Visit: Payer: Self-pay

## 2023-02-13 ENCOUNTER — Encounter (HOSPITAL_COMMUNITY)
Admission: RE | Disposition: A | Discharge status: Home / Self Care | Payer: Self-pay | Source: Home / Self Care | Attending: Thoracic Surgery (Cardiothoracic Vascular Surgery)

## 2023-02-13 DIAGNOSIS — E119 Type 2 diabetes mellitus without complications: Secondary | ICD-10-CM | POA: Diagnosis not present

## 2023-02-13 DIAGNOSIS — Z9889 Other specified postprocedural states: Secondary | ICD-10-CM | POA: Insufficient documentation

## 2023-02-13 DIAGNOSIS — Z6839 Body mass index (BMI) 39.0-39.9, adult: Secondary | ICD-10-CM | POA: Insufficient documentation

## 2023-02-13 DIAGNOSIS — I10 Essential (primary) hypertension: Secondary | ICD-10-CM | POA: Diagnosis not present

## 2023-02-13 DIAGNOSIS — I341 Nonrheumatic mitral (valve) prolapse: Secondary | ICD-10-CM | POA: Insufficient documentation

## 2023-02-13 DIAGNOSIS — K222 Esophageal obstruction: Secondary | ICD-10-CM

## 2023-02-13 DIAGNOSIS — G4733 Obstructive sleep apnea (adult) (pediatric): Secondary | ICD-10-CM | POA: Insufficient documentation

## 2023-02-13 DIAGNOSIS — Z01818 Encounter for other preprocedural examination: Secondary | ICD-10-CM | POA: Diagnosis not present

## 2023-02-13 HISTORY — PX: ESOPHAGOGASTRODUODENOSCOPY: SHX5428

## 2023-02-13 HISTORY — PX: ESOPHAGEAL DILATION: SHX303

## 2023-02-13 LAB — GLUCOSE, CAPILLARY: Glucose-Capillary: 95 mg/dL (ref 70–99)

## 2023-02-13 SURGERY — EGD (ESOPHAGOGASTRODUODENOSCOPY)
Anesthesia: General

## 2023-02-13 MED ORDER — LABETALOL HCL 5 MG/ML IV SOLN
INTRAVENOUS | Status: AC
  Filled 2023-02-13: qty 4

## 2023-02-13 MED ORDER — ACETAMINOPHEN 10 MG/ML IV SOLN: 1000.0000 mg | Freq: Once | INTRAVENOUS | Status: DC | PRN

## 2023-02-13 MED ORDER — DEXAMETHASONE SODIUM PHOSPHATE 10 MG/ML IJ SOLN
INTRAMUSCULAR | Status: DC | PRN
  Administered 2023-02-13: 10 mg via INTRAVENOUS

## 2023-02-13 MED ORDER — ACETAMINOPHEN 160 MG/5ML PO SOLN: 1000.0000 mg | Freq: Once | ORAL | Status: DC | PRN

## 2023-02-13 MED ORDER — ONDANSETRON HCL 4 MG/2ML IJ SOLN
INTRAMUSCULAR | Status: AC
  Filled 2023-02-13: qty 2

## 2023-02-13 MED ORDER — SUCCINYLCHOLINE CHLORIDE 200 MG/10ML IV SOSY
PREFILLED_SYRINGE | INTRAVENOUS | Status: DC | PRN
  Administered 2023-02-13: 160 mg via INTRAVENOUS

## 2023-02-13 MED ORDER — LIDOCAINE 2% (20 MG/ML) 5 ML SYRINGE
INTRAMUSCULAR | Status: DC | PRN
  Administered 2023-02-13: 60 mg via INTRAVENOUS

## 2023-02-13 MED ORDER — ORAL CARE MOUTH RINSE: 15.0000 mL | Freq: Once | OROMUCOSAL | Status: AC

## 2023-02-13 MED ORDER — SODIUM CHLORIDE 0.9% FLUSH: 10.0000 mL | Freq: Two times a day (BID) | INTRAVENOUS | Status: DC

## 2023-02-13 MED ORDER — ROCURONIUM BROMIDE 10 MG/ML (PF) SYRINGE
PREFILLED_SYRINGE | INTRAVENOUS | Status: AC
  Filled 2023-02-13: qty 10

## 2023-02-13 MED ORDER — PHENYLEPHRINE 80 MCG/ML (10ML) SYRINGE FOR IV PUSH (FOR BLOOD PRESSURE SUPPORT)
PREFILLED_SYRINGE | INTRAVENOUS | Status: AC
  Filled 2023-02-13: qty 10

## 2023-02-13 MED ORDER — FENTANYL CITRATE (PF) 250 MCG/5ML IJ SOLN
INTRAMUSCULAR | Status: DC | PRN
  Administered 2023-02-13 (×2): 50 ug via INTRAVENOUS

## 2023-02-13 MED ORDER — LABETALOL HCL 5 MG/ML IV SOLN
INTRAVENOUS | Status: DC | PRN
  Administered 2023-02-13: 5 mg via INTRAVENOUS

## 2023-02-13 MED ORDER — FENTANYL CITRATE (PF) 250 MCG/5ML IJ SOLN
INTRAMUSCULAR | Status: AC
  Filled 2023-02-13: qty 5

## 2023-02-13 MED ORDER — DEXAMETHASONE SODIUM PHOSPHATE 10 MG/ML IJ SOLN
INTRAMUSCULAR | Status: AC
  Filled 2023-02-13: qty 1

## 2023-02-13 MED ORDER — FENTANYL CITRATE (PF) 100 MCG/2ML IJ SOLN: 25.0000 ug | INTRAMUSCULAR | Status: DC | PRN

## 2023-02-13 MED ORDER — PHENYLEPHRINE 80 MCG/ML (10ML) SYRINGE FOR IV PUSH (FOR BLOOD PRESSURE SUPPORT)
PREFILLED_SYRINGE | INTRAVENOUS | Status: DC | PRN
  Administered 2023-02-13 (×2): 160 ug via INTRAVENOUS

## 2023-02-13 MED ORDER — ONDANSETRON HCL 4 MG/2ML IJ SOLN
INTRAMUSCULAR | Status: DC | PRN
  Administered 2023-02-13: 4 mg via INTRAVENOUS

## 2023-02-13 MED ORDER — ROCURONIUM BROMIDE 10 MG/ML (PF) SYRINGE
PREFILLED_SYRINGE | INTRAVENOUS | Status: DC | PRN
  Administered 2023-02-13: 60 mg via INTRAVENOUS

## 2023-02-13 MED ORDER — SODIUM CHLORIDE 0.9 % IV SOLN: INTRAVENOUS | Status: DC | PRN

## 2023-02-13 MED ORDER — PROPOFOL 10 MG/ML IV BOLUS
INTRAVENOUS | Status: DC | PRN
  Administered 2023-02-13: 120 mg via INTRAVENOUS
  Administered 2023-02-13: 50 mg via INTRAVENOUS
  Administered 2023-02-13: 30 mg via INTRAVENOUS

## 2023-02-13 MED ORDER — OXYCODONE HCL 5 MG PO TABS: 5.0000 mg | ORAL_TABLET | Freq: Once | ORAL | Status: DC | PRN

## 2023-02-13 MED ORDER — SUCCINYLCHOLINE CHLORIDE 200 MG/10ML IV SOSY
PREFILLED_SYRINGE | INTRAVENOUS | Status: AC
  Filled 2023-02-13: qty 10

## 2023-02-13 MED ORDER — CHLORHEXIDINE GLUCONATE 0.12 % MT SOLN
15.0000 mL | Freq: Once | OROMUCOSAL | Status: AC
  Administered 2023-02-13: 15 mL via OROMUCOSAL
  Filled 2023-02-13: qty 15

## 2023-02-13 MED ORDER — EPHEDRINE 5 MG/ML INJ
INTRAVENOUS | Status: AC
  Filled 2023-02-13: qty 5

## 2023-02-13 MED ORDER — ACETAMINOPHEN 500 MG PO TABS: 1000.0000 mg | ORAL_TABLET | Freq: Once | ORAL | Status: DC | PRN

## 2023-02-13 MED ORDER — SUGAMMADEX SODIUM 200 MG/2ML IV SOLN
INTRAVENOUS | Status: DC | PRN
  Administered 2023-02-13: 200 mg via INTRAVENOUS

## 2023-02-13 MED ORDER — LIDOCAINE 2% (20 MG/ML) 5 ML SYRINGE
INTRAMUSCULAR | Status: AC
  Filled 2023-02-13: qty 5

## 2023-02-13 MED ORDER — PROPOFOL 10 MG/ML IV BOLUS
INTRAVENOUS | Status: AC
  Filled 2023-02-13: qty 20

## 2023-02-13 MED ORDER — OXYCODONE HCL 5 MG/5ML PO SOLN: 5.0000 mg | Freq: Once | ORAL | Status: DC | PRN

## 2023-02-13 SURGICAL SUPPLY — 22 items
BUTTON OLYMPUS DEFENDO 5 PIECE (MISCELLANEOUS) ×1 IMPLANT
CANISTER SUCT 3000ML PPV (MISCELLANEOUS) ×1 IMPLANT
CNTNR URN SCR LID CUP LEK RST (MISCELLANEOUS) ×1 IMPLANT
CONT SPEC 4OZ STRL OR WHT (MISCELLANEOUS) ×1
GAUZE SPONGE 4X4 12PLY STRL (GAUZE/BANDAGES/DRESSINGS) ×1 IMPLANT
GLOVE BIO SURGEON STRL SZ7 (GLOVE) ×1 IMPLANT
GLOVE BIO SURGEON STRL SZ7.5 (GLOVE) ×1 IMPLANT
GOWN STRL REUS W/ TWL XL LVL3 (GOWN DISPOSABLE) ×1 IMPLANT
GOWN STRL REUS W/TWL XL LVL3 (GOWN DISPOSABLE) ×1
MARKER SKIN DUAL TIP RULER LAB (MISCELLANEOUS) ×1 IMPLANT
NS IRRIG 1000ML POUR BTL (IV SOLUTION) ×1 IMPLANT
OIL SILICONE PENTAX (PARTS (SERVICE/REPAIRS)) IMPLANT
PAD ARMBOARD 7.5X6 YLW CONV (MISCELLANEOUS) ×2 IMPLANT
SYR 20ML ECCENTRIC (SYRINGE) ×1 IMPLANT
SYR 20ML LL LF (SYRINGE) IMPLANT
TOWEL GREEN STERILE (TOWEL DISPOSABLE) ×1 IMPLANT
TOWEL GREEN STERILE FF (TOWEL DISPOSABLE) ×1 IMPLANT
TUBE CONNECTING 20X1/4 (TUBING) ×1 IMPLANT
TUBING ENDO SMARTCAP (MISCELLANEOUS) ×1 IMPLANT
UNDERPAD 30X36 HEAVY ABSORB (UNDERPADS AND DIAPERS) ×1 IMPLANT
WATER STERILE IRR 1000ML POUR (IV SOLUTION) ×1 IMPLANT
WIRE HYDRA 450CM (MISCELLANEOUS) IMPLANT

## 2023-02-13 NOTE — H&P (Signed)
[address]       [place] [address]             [phone]                                                   Jennifer [name] [facility] Medical Record #[id number] Date of Birth: Jul 11, 1950   Referring: Napoleon Form, MD Primary Care: Wanda Plump, MD Primary Cardiologist: None   Chief Complaint:        Chief Complaint  Patient presents with   Esophageal Achalasia      New patient consultation [facility] END[facility] 5/14, Chest CT 8/21      History of Present Illness:    Jennifer [name] 72 y.o. female presents for surgical evaluation of a distal esophageal stricture.  She has undergone a Nissen fundoplication with hiatal hernia repair on 3 separate occasions via a laparotomy.  Her last surgery was in 2003, where she had extensive lysis of adhesions and a gortex mesh placed to reconstruct her hiatus.  Up until about 10 years ago she was doing well, but then started developing some dysphagia, and odynophagia.  Over the last 5 years her symptoms have worsened.  In 2012, she was seen at [facility], where she was noted to have candidiasis, and a stricture.  She was scheduled to undergo monthly dilation, but stopped after 6 months due to pain.     She currently complains of constant chest pain, and burning with acidic foods.  She regularly has dysphagia, and has to throw up food that gets caught.  Her weight has been stable.  She also complains of significant mucus that she coughs up every morning.             Past Medical History:  Diagnosis Date   Allergy     Anemia     Arthritis     Cataract      bil cataracts removed   Chronic chest pain     Chronic fatigue 02/03/2017   Clotting disorder [facility])      Pulmonary [place] 2010 & 2015   Colon polyps      inflamed partially ulcerated hyperplastic polyp   Diabetes (HCC) 09/24/2013    no meds   Diverticulosis     Dysphagia esophageal phase - chronic after 3 fundoplications 01/20/2014   Edema of both lower extremities      Esophageal stenosis      Stricture after fundoplication REDO   Fibromyalgia     GERD (gastroesophageal reflux disease)     Glaucoma suspect     Heart murmur      no problems    Hemorrhoids, internal, with bleeding 06/27/2017   History of hiatal hernia     Hypertension     IBS (irritable bowel syndrome)     Lactose intolerance     Mitral valve prolapse     OSA (obstructive sleep apnea)      on CPAP-Mild - Sleep study 2004   Pre-diabetes     Pulmonary embolus (HCC)      2010 and 12-2013   Sleep apnea      wears CPAP   Thyroid nodule        dr. watching    Tinnitus      Subjective  [place] 2014    R flank               Past Surgical History:  Procedure Laterality Date   ABDOMINAL HYSTERECTOMY        still has 1 ovary    APPENDECTOMY       BRAIN SURGERY   1995    Stem surgery, Arnold Chiari Malformation   CATARACT EXTRACTION Bilateral 2017    cataracts, Dr. Elmer Picker   CHOLECYSTECTOMY       COLONOSCOPY       ESOPHAGEAL MANOMETRY N/A 08/06/2019    Procedure: ESOPHAGEAL MANOMETRY (EM);  Surgeon: Iva Boop, MD;  Location: [facility] ENDOSCOPY;  Service: Endoscopy;  Laterality: N/A;   HEMORRHOID BANDING       HERNIA REPAIR        Hital Hernia   KNEE ARTHROSCOPY   05/16/2012    Procedure: ARTHROSCOPY KNEE;  Surgeon: Kennieth Rad, MD;  Location: [facility] OR;  Service: Orthopedics;  Laterality: Left;   KNEE ARTHROSCOPY Right 01/2018   NISSEN FUNDOPLICATION        X 3 lab, open x 2 last with mesh   POLYPECTOMY       REDUCTION MAMMAPLASTY Bilateral 2007   TOTAL KNEE ARTHROPLASTY Right 08/25/2019    Procedure: RIGHT TOTAL KNEE ARTHROPLASTY;  Surgeon: Ollen Gross, MD;  Location: [facility] ORS;  Service: Orthopedics;  Laterality: Right;    UPPER GASTROINTESTINAL ENDOSCOPY                   Family History  Problem Relation Age of Onset   Diabetes Mother     Rheum arthritis Mother     Hypertension Mother     Hypertension Father     Alcoholism Father     Heart disease  Father     Sudden death Father     Rheum arthritis Sister     Hypertension Sister     Arthritis Sister     Rheum arthritis Sister     Rheum arthritis Sister     CAD Sister          MI age 52   Fibromyalgia Sister     Colon cancer Maternal Grandmother          dx with colon ca laster in life - lived to be 104   Crohn's disease Maternal Aunt     Stomach cancer Maternal Uncle     Lung cancer Maternal Uncle     Breast cancer Neg Hx     Esophageal cancer Neg Hx     Rectal cancer Neg Hx     Prostate cancer Neg Hx     Pancreatic cancer Neg Hx     Colon polyps Neg Hx              Tobacco Use History  Social History        Tobacco Use  Smoking Status Never  Smokeless Tobacco Never  Tobacco Comments    never used tobacco      Social History        Substance and Sexual Activity  Alcohol Use No   Alcohol/week: 0.0 standard drinks of alcohol        Allergies       Allergies  Allergen Reactions   Morphine And Codeine Anaphylaxis and Shortness Of Breath   Sulfonamide Derivatives Hives      Burn from inside out   Promethazine Hcl Other (See Comments)  Hallucinations              Current Outpatient Medications  Medication Sig Dispense Refill   acetaminophen (TYLENOL) 500 MG tablet Take 1 tablet (500 mg total) by mouth every 6 (six) hours as needed. (Patient taking differently: Take 500 mg by mouth every 6 (six) hours as needed for moderate pain, mild pain or headache.) 30 tablet 0   ACIPHEX 20 MG tablet Take 1 tablet (20 mg total) by mouth 2 (two) times daily. 60 tablet 11   Apoaequorin (PREVAGEN) 10 MG CAPS Take 10 mg by mouth daily. prevagen       cycloSPORINE (RESTASIS) 0.05 % ophthalmic emulsion Place 1 drop into both eyes 2 (two) times daily.       hydrocortisone valerate cream (WESTCORT) 0.2 % Apply 1 Application topically at bedtime.       hyoscyamine (LEVSIN SL) 0.125 MG SL tablet DISSOLVE ONE TABLET UNDER TONGUE EVERY FOUR HOURS AS NEEDED 90 tablet 0    Multiple Vitamins-Minerals (MULTIVITAMIN WITH MINERALS) tablet Take 2 tablets by mouth daily. Vitafusion woman gummy       rosuvastatin (CRESTOR) 10 MG tablet Take 1 tablet (10 mg total) by mouth at bedtime. 90 tablet 1   temazepam (RESTORIL) 30 MG capsule TAKE ONE CAPSULE BY MOUTH AT BEDTIME AS NEEDED FOR SLEEP 30 capsule 4   triamterene-hydrochlorothiazide (MAXZIDE-25) 37.5-25 MG tablet Take 0.5 tablets by mouth daily. 45 tablet 1   XARELTO 2.5 MG TABS tablet TAKE TWO TABLETS BY MOUTH DAILY 60 tablet 0      No current facility-administered medications for this visit.        Review of Systems  Constitutional:  Negative for malaise/fatigue and weight loss.  Respiratory:  Positive for cough.   Cardiovascular:  Positive for chest pain.  Gastrointestinal:  Positive for abdominal pain, heartburn and vomiting.        PHYSICAL EXAMINATION: BP (!) 159/83 (BP Location: Left Arm, Patient Position: Sitting, Cuff Size: Large)   Pulse 77   Resp 20   Ht 5\' 8"  (1.727 m)   Wt 260 lb (117.9 kg)   SpO2 93% Comment: RA  BMI 39.53 kg/m  Physical Exam Constitutional:      General: She is not in acute distress.    Appearance: Normal appearance. She is not ill-appearing.  HENT:     Head: Normocephalic and atraumatic.  Eyes:     Extraocular Movements: Extraocular movements intact.  Cardiovascular:     Rate and Rhythm: Normal rate.  Abdominal:     General: There is no distension.     Musculoskeletal:        General: Normal range of motion.     Cervical back: Normal range of motion.  Skin:    General: Skin is warm and dry.  Neurological:     General: No focal deficit present.     Mental Status: She is alert and oriented to person, place, and time.        Diagnostic Studies & Laboratory data:    CT Scan:  IMPRESSION: Mild circumferential distal esophageal wall thickening compatible with given history of esophagitis.   Scattered coronary artery disease.   1.7 cm left thyroid  nodule. This has been evaluated on previous imaging. (ref: J Am Coll Radiol. 2015 Feb;12(2): 143-50).     EGD/EUS:  - LA Grade C ( one or more mucosal breaks continuous between tops of 2 or more mucosal folds, less than 75% circumference) esophagitis with no bleeding was  found 32 to 38 cm from the incisors. Biopsies were taken with a cold forceps for histology. - One benign- appearing, intrinsic mild stenosis was found 38 to 39 cm from the incisors. The stenosis was traversed. A TTS dilator was passed through the scope. Dilation with an 18- 19- 20 mm balloon dilator was performed to 20 mm. The dilation site was examined following endoscope reinsertion and showed mild mucosal disruption. - Evidence of a Nissen fundoplication was found in the cardia. The wrap appeared intact. This was traversed. - The stomach was otherwise normal. - The cardia and gastric fundus were normal on retroflexion.         I have independently reviewed the above radiology studies  and reviewed the findings with the patient.    Recent Lab Findings: Recent Labs       Lab Results  Component Value Date    WBC 3.7 (L) 12/27/2022    HGB 12.4 12/27/2022    HCT 39.4 12/27/2022    PLT 237 12/27/2022    GLUCOSE 113 (H) 12/27/2022    CHOL 133 06/05/2022    TRIG 49.0 06/05/2022    HDL 71.80 06/05/2022    LDLCALC 51 06/05/2022    ALT 15 12/27/2022    AST 24 12/27/2022    NA 142 12/27/2022    K 4.2 12/27/2022    CL 103 12/27/2022    CREATININE 0.89 12/27/2022    BUN 15 12/27/2022    CO2 31 12/27/2022    TSH 4.110 12/12/2022    INR 0.9 09/05/2021    HGBA1C 6.3 (H) 12/12/2022              Assessment / Plan:   72yo female with distal esophageal stricture in the setting of 3 hiatal hernia repairs and fundoplication, the last of which was performed with mesh.  She has had multiple dilation in the past, which are now becoming less effective.  I explained to her that surgery would be extremely risky based on the  previous op report and the fact that mesh was used for reconstruction at the last repair.  On further questioning, she did not have any dysphagia after her last surgery, thus it possible that her symptoms are due to worsening scar tissue.  I have recommended that she undergo savary dilation on a weekly basis to see if her symptoms improve.  Surgery would only be reserved as a last ditch effort, and give the fact that she has not lost any weight, and appears in good shape, I explained to her that any complication would be worst that her current symptoms.  She is in agreement to proceed with the dilations.

## 2023-02-13 NOTE — Op Note (Signed)
      [address]       [place] [address]             [phone]          02/06/2023   Patient:  Jennifer [name] Pre-Op Dx: Esophageal Stricture Hx of hiatal hernia repair with fundoplication X 3   Post-op Dx:  same Procedure: - Esophagogastroscopy - Savary dilation of the esophagus to 60 F/43mm     Surgeon and Role:      * Janey Petron, Eliezer Lofts, MD - Primary   Anesthesia  general EBL:  minimal  Blood Administration: none Specimen:  none     Counts: correct     Indications: 72yo female with distal esophageal stricture in the setting of 3 hiatal hernia repairs and fundoplication, the last of which was performed with mesh. She has had multiple dilation in the past, which are now becoming less effective. I explained to her that surgery would be extremely risky based on the previous op report and the fact that mesh was used for reconstruction at the last repair. On further questioning, she did not have any dysphagia after her last surgery, thus it possible that her symptoms are due to worsening scar tissue. I have recommended that she undergo savary dilation on a weekly basis to see if her symptoms improve. Surgery would only be reserved as a last ditch effort, and give the fact that she has not lost any weight, and appears in good shape, I explained to her that any complication would be worst that her current symptoms. She is in agreement to proceed with the dilations.    Findings: I was able to pass into the stomach without any obstruction.  There were some white plaques noted in the esophagus and stomach.  We dilated up to a 33F without much resistance.  Post dilation there was scant blood at the GE junction.   Operative Technique: After the risks, benefits and alternatives were thoroughly discussed, the patient was brought to the operative theatre.  Anesthesia was induced. The patient was prepped and draped in normal sterile fashion.  An appropriate surgical  pause was performed, and pre-operative antibiotics were dosed accordingly.   The gastroscope was advanced through the oropharynx into the cervical esophagus under direct visualization.  The scope was passed into the stomach.  The scope was then pulled back, and the esophageal mucosa was visualized.  A mucosal was  normal appearing with some white plaques.  Next a Jag wire was passed through the gastroscope into the stomach with fluoroscopic guidance.  Savary dilators were used under fluoroscopic visualization.  We started with a 22F and dilated up to 33F without meeting any resistance.     The gastroscope was then placed, and the esophagus was visualized.  Mild bleeding was evident.      The patient tolerated the procedure without any immediate complications, and was transferred to the PACU in stable condition.   Tenya Araque Keane Scrape

## 2023-02-13 NOTE — Transfer of Care (Signed)
Immediate Anesthesia Transfer of Care Note  Patient: Jennifer [name]  Procedure(s) Performed: ESOPHAGOGASTRODUODENOSCOPY (EGD) ESOPHAGEAL DILATION  Patient Location: PACU  Anesthesia Type:General  Level of Consciousness: drowsy, patient cooperative, and responds to stimulation  Airway & Oxygen Therapy: Patient Spontanous Breathing and Patient connected to face mask oxygen  Post-op Assessment: Report given to RN and Post -op Vital signs reviewed and stable  Post vital signs: Reviewed and stable  Last Vitals:  Vitals Value Taken Time  BP 142/130 02/13/23 1508  Temp    Pulse 81 02/13/23 1510  Resp 14 02/13/23 1510  SpO2 96 % 02/13/23 1510  Vitals shown include unfiled device data.  Last Pain:  Vitals:   02/13/23 1114  TempSrc:   PainSc: 4          Complications: No notable events documented.

## 2023-02-13 NOTE — Anesthesia Preprocedure Evaluation (Signed)
Anesthesia Evaluation  Patient identified by MRN, date of birth, ID band Patient awake    Reviewed: Allergy & Precautions, H&P , NPO status , Patient's Chart, lab work & pertinent test results  History of Anesthesia Complications Negative for: history of anesthetic complications  Airway Mallampati: III   Neck ROM: full    Dental  (+) Teeth Intact, Dental Advisory Given   Pulmonary neg shortness of breath, sleep apnea and Continuous Positive Airway Pressure Ventilation , neg COPD, neg recent URI   breath sounds clear to auscultation       Cardiovascular hypertension, + Valvular Problems/Murmurs MVP  Rhythm:regular Rate:Normal     Neuro/Psych  Headaches PSYCHIATRIC DISORDERS Anxiety      Neuromuscular disease    GI/Hepatic Neg liver ROS, hiatal hernia,GERD  ,,S/p Nissen Esophageal stricture   Endo/Other  diabetes, Type 2  Morbid obesity  Renal/GU negative Renal ROSLab Results      Component                Value               Date                      NA                       139                 02/06/2023                K                        3.4 (L)             02/06/2023                CO2                      24                  02/06/2023                GLUCOSE                  110 (H)             02/06/2023                BUN                      14                  02/06/2023                CREATININE               0.89                02/06/2023                CALCIUM                  9.1                 02/06/2023                GFR  58.20 (L)           06/05/2022                EGFR                     67                  12/12/2022                GFRNONAA                 >60                 02/06/2023                Musculoskeletal  (+) Arthritis ,  Fibromyalgia -  Abdominal   Peds  Hematology Lab Results      Component                Value               Date                      WBC                       5.1                 02/06/2023                HGB                      12.8                02/06/2023                HCT                      40.0                02/06/2023                MCV                      88.1                02/06/2023                PLT                      226                 02/06/2023              Anesthesia Other Findings   Reproductive/Obstetrics                              Anesthesia Physical Anesthesia Plan  ASA: 3  Anesthesia Plan: General   Post-op Pain Management: Minimal or no pain anticipated   Induction: Intravenous and Rapid sequence  PONV Risk Score and Plan: 3 and Propofol infusion, Treatment may vary due to age or medical condition and Ondansetron  Airway Management Planned: Oral ETT  Additional Equipment: None  Intra-op Plan:   Post-operative Plan: Extubation in OR  Informed Consent: I have reviewed the patients History and Physical, chart, labs and discussed the procedure  including the risks, benefits and alternatives for the proposed anesthesia with the patient or authorized representative who has indicated his/her understanding and acceptance.     Dental advisory given  Plan Discussed with: CRNA, Anesthesiologist and Surgeon  Anesthesia Plan Comments:         Anesthesia Quick Evaluation

## 2023-02-13 NOTE — Discharge Summary (Signed)
Physician Discharge Summary   Patient ID: SETAREH LOIACONO [id number] 72 y.o. 03/03/1951  Admit date: 02/13/2023  Discharge date and time: No discharge date for patient encounter.   Admitting Physician: Corliss Skains, MD   Discharge Physician: Cliffton Asters  Admission Diagnoses: ESOPHAGEAL STRICTURE  Discharge Diagnoses: esophageal strictures  Admission Condition: good  Discharged Condition: good  Indication for Admission: Outpatient procedure  Hospital Course: uncomplicated  Consults: None   Disposition:  There are no questions and answers to display.        Patient Instructions:   Activity: activity as tolerated Diet: regular diet Wound Care: none needed  Follow-up with Dr. Cliffton Asters in 1 week.  SignedCorliss Skains 02/13/2023 3:09 PM

## 2023-02-13 NOTE — Anesthesia Procedure Notes (Signed)
Procedure Name: Intubation Date/Time: 02/13/2023 1:50 PM  Performed by: Alease Medina, CRNAPre-anesthesia Checklist: Patient identified, Emergency Drugs available, Suction available and Patient being monitored Patient Re-evaluated:Patient Re-evaluated prior to induction Oxygen Delivery Method: Circle system utilized Preoxygenation: Pre-oxygenation with 100% oxygen Induction Type: IV induction Ventilation: Mask ventilation without difficulty Laryngoscope Size: Mac and 3 Grade View: Grade I Tube type: Oral Tube size: 7.0 mm Number of attempts: 1 Airway Equipment and Method: Stylet and Oral airway Placement Confirmation: ETT inserted through vocal cords under direct vision, positive ETCO2 and breath sounds checked- equal and bilateral Secured at: 21 cm Tube secured with: Tape Dental Injury: Teeth and Oropharynx as per pre-operative assessment

## 2023-02-14 ENCOUNTER — Encounter (HOSPITAL_COMMUNITY): Payer: Self-pay | Admitting: Thoracic Surgery (Cardiothoracic Vascular Surgery)

## 2023-02-16 NOTE — Anesthesia Postprocedure Evaluation (Signed)
Anesthesia Post Note  Patient: MIRAJANE SEEPERSAD  Procedure(s) Performed: ESOPHAGOGASTRODUODENOSCOPY (EGD) ESOPHAGEAL DILATION     Patient location during evaluation: Endoscopy Anesthesia Type: General Level of consciousness: awake and alert Pain management: pain level controlled Vital Signs Assessment: post-procedure vital signs reviewed and stable Respiratory status: spontaneous breathing, nonlabored ventilation and respiratory function stable Cardiovascular status: blood pressure returned to baseline and stable Postop Assessment: no apparent nausea or vomiting Anesthetic complications: no   No notable events documented.  Last Vitals:  Vitals:   02/13/23 1600 02/13/23 1615  BP: (!) 152/80 (!) 152/70  Pulse: 72 71  Resp: 12 16  Temp:  36.5 C  SpO2: 100% 93%    Last Pain:  Vitals:   02/13/23 1510  TempSrc:   PainSc: Asleep                 Makiyla Linch

## 2023-02-19 ENCOUNTER — Ambulatory Visit (INDEPENDENT_AMBULATORY_CARE_PROVIDER_SITE_OTHER): Payer: Medicare Other | Admitting: Thoracic Surgery (Cardiothoracic Vascular Surgery)

## 2023-02-19 DIAGNOSIS — K222 Esophageal obstruction: Secondary | ICD-10-CM

## 2023-02-19 NOTE — Progress Notes (Signed)
     [address]       [place] [address]             [phone]       Patient: Home Provider: Office Consent for Telemedicine visit obtained.  Today's visit was completed via a real-time telehealth (see specific modality noted below). The patient/authorized person provided oral consent at the time of the visit to engage in a telemedicine encounter with the present provider at [facility]. The patient/authorized person was informed of the potential benefits, limitations, and risks of telemedicine. The patient/authorized person expressed understanding that the laws that protect confidentiality also apply to telemedicine. The patient/authorized person acknowledged understanding that telemedicine does not provide emergency services and that he or she would need to call 911 or proceed to the nearest hospital for help if such a need arose.   Total time spent in the clinical discussion 10 minutes.  Telehealth Modality: Phone visit (audio only)  I had a telephone visit with Jennifer [name].  Overall she is doing much better.  She is not having as much burning, and not having much dysphagia.  I have instructed her to start eating more regular food.  I will follow-up with her in 2 weeks.  If she starts having more dysphagia, we will do another dilation prior to Thanksgiving.  Jennifer [name] Keane Scrape

## 2023-02-20 ENCOUNTER — Other Ambulatory Visit: Payer: Self-pay | Admitting: Internal Medicine

## 2023-02-22 ENCOUNTER — Encounter (INDEPENDENT_AMBULATORY_CARE_PROVIDER_SITE_OTHER): Payer: Self-pay | Admitting: Internal Medicine

## 2023-02-22 ENCOUNTER — Ambulatory Visit (INDEPENDENT_AMBULATORY_CARE_PROVIDER_SITE_OTHER): Payer: Medicare Other | Admitting: Internal Medicine

## 2023-02-22 VITALS — BP 132/79 | HR 82 | Temp 98.3°F | Ht 68.0 in | Wt 257.0 lb

## 2023-02-22 DIAGNOSIS — E66812 Obesity, class 2: Secondary | ICD-10-CM | POA: Diagnosis not present

## 2023-02-22 DIAGNOSIS — R1310 Dysphagia, unspecified: Secondary | ICD-10-CM | POA: Diagnosis not present

## 2023-02-22 DIAGNOSIS — Z6838 Body mass index (BMI) 38.0-38.9, adult: Secondary | ICD-10-CM

## 2023-02-22 DIAGNOSIS — E1169 Type 2 diabetes mellitus with other specified complication: Secondary | ICD-10-CM

## 2023-02-22 DIAGNOSIS — Z7985 Long-term (current) use of injectable non-insulin antidiabetic drugs: Secondary | ICD-10-CM | POA: Diagnosis not present

## 2023-02-22 NOTE — Assessment & Plan Note (Signed)
Patient is inquiring about GLP-1 therapy.  Her food recall suggest a high percentage of carbs and smaller proportion of protein, fruits and vegetables.  This will definitely affect her weight loss rate as she also has diabetes.  She feels that reducing carbs will be challenging for her as she fears changing her eating patterns because of her dysphagia.  I recommend that she see a registered dietitian for more tailored approach as she would benefit from more balanced meals.

## 2023-02-22 NOTE — Progress Notes (Signed)
Office: [phone]  /  Fax: [phone]  WEIGHT SUMMARY AND BIOMETRICS  Vitals Temp: 98.3 F (36.8 C) BP: 132/79 Pulse Rate: 82 SpO2: 95 %   Anthropometric Measurements Height: 5\' 8"  (1.727 m) Weight: 257 lb (116.6 kg) BMI (Calculated): 39.09 Weight at Last Visit: 254 lb Weight Lost Since Last Visit: 0 lb Weight Gained Since Last Visit: 3 lb Starting Weight: 256 lb Total Weight Loss (lbs): 0 lb (0 kg) Peak Weight: 259 lb   Body Composition  Body Fat %: 48.1 % Fat Mass (lbs): 123.6 lbs Muscle Mass (lbs): 126.6 lbs Total Body Water (lbs): 85.8 lbs Visceral Fat Rating : 16    RMR: 1771  Today's Visit #: 4  Starting Date: 12/12/22   HPI  Chief Complaint: OBESITY  Jennifer [name] is here to discuss her progress with her obesity treatment plan. She is on the the Category 2 Plan and states she is following her eating plan approximately 50 % of the time. She states she is exercising 20 minutes 3 times per week.  Interval History:   Discussed the use of AI scribe software for clinical note transcription with the patient, who gave verbal consent to proceed.  History of Present Illness    The patient, with a history of esophageal stricture, presents for weight management. She reports recent weight gain of three pounds, despite a perceived decrease in food intake due to esophageal discomfort. The patient has been undergoing esophageal dilation procedures, which have temporarily ceased after reaching the maximum dilation size. She reports significant relief from chest pain following these procedures, attributing previous discomfort to the esophageal stricture rather than initially suspected cardiac causes.  The patient's diet is primarily soft foods, with a consistency akin to mashed potatoes, due to swallowing difficulties. She reports consuming protein shakes, yogurt, mashed potatoes, and occasional ice cream. She also mentions occasional consumption of bagels, eggs, and cheese  sandwiches when her throat condition allows. The patient admits to skipping meals, particularly lunch, and consuming small amounts of candy to counteract the taste of medication. She expresses a desire to incorporate more fruits and vegetables into her diet but is concerned about the potential for increased swallowing discomfort.  The patient's current diet is high in carbohydrates and low in protein, fruits, and vegetables. She expresses a fear that if she transitions to a diet of softer foods due to her esophageal condition, she may be unable to return to a regular diet in the future. The patient acknowledges the need for a more balanced diet and is open to dietary consultation.       Barriers identified: limited food variation or intolerances and low volume of physical activity at present .   Pharmacotherapy for weight loss: She is currently taking no anti-obesity medication and patient is inquiring about GLP-1 therapy. .    ASSESSMENT AND PLAN  TREATMENT PLAN FOR OBESITY:  Recommended Dietary Goals  Jennifer [name] is currently in the action stage of change. As such, her goal is to continue weight management plan. She has agreed to: continue current plan  Behavioral Intervention  We discussed the following Behavioral Modification Strategies today: continue to work on maintaining a reduced calorie state, getting the recommended amount of protein, incorporating whole foods, making healthy choices, staying well hydrated and practicing mindfulness when eating..  Additional resources provided today: None  Recommended Physical Activity Goals  Jennifer [name] has been advised to work up to 150 minutes of moderate intensity aerobic activity a week and strengthening exercises 2-3 times per  week for cardiovascular health, weight loss maintenance and preservation of muscle mass.   She has agreed to :  Think about enjoyable ways to increase daily physical activity and overcoming barriers to exercise and Increase  physical activity in their day and reduce sedentary time (increase NEAT).  Pharmacotherapy We discussed various medication options to help Jennifer [name] with her weight loss efforts and we both agreed to :  Patient is interested in GLP-1 therapy I am concerned about her esophageal stricture and inability to swallow some consistencies.  I have asked her to discuss with her gastroenterologist to see if there is presents a relative contraindication to GLP-1 agonist.  ASSOCIATED CONDITIONS ADDRESSED TODAY  Dysphagia, unspecified type -     Amb ref to Medical Nutrition Therapy-MNT  Type 2 diabetes mellitus with other specified complication, without long-term current use of insulin (HCC) -     Amb ref to Medical Nutrition Therapy-MNT  Class 2 severe obesity with serious comorbidity and body mass index (BMI) of 38.0 to 38.9 in adult, unspecified obesity type [facility]) Assessment & Plan: Patient is inquiring about GLP-1 therapy.  Her food recall suggest a high percentage of carbs and smaller proportion of protein, fruits and vegetables.  This will definitely affect her weight loss rate as she also has diabetes.  She feels that reducing carbs will be challenging for her as she fears changing her eating patterns because of her dysphagia.  I recommend that she see a registered dietitian for more tailored approach as she would benefit from more balanced meals.  Orders: -     Amb ref to Medical Nutrition Therapy-MNT    PHYSICAL EXAM:  Blood pressure 132/79, pulse 82, temperature 98.3 F (36.8 C), height 5\' 8"  (1.727 m), weight 257 lb (116.6 kg), SpO2 95%. Body mass index is 39.08 kg/m.  General: She is overweight, cooperative, alert, well developed, and in no acute distress. PSYCH: Has normal mood, affect and thought process.   HEENT: EOMI, sclerae are anicteric. Lungs: Normal breathing effort, no conversational dyspnea. Extremities: No edema.  Neurologic: No gross sensory or motor deficits. No tremors or  fasciculations noted.    DIAGNOSTIC DATA REVIEWED:  BMET    Component Value Date/Time   NA 139 02/06/2023 1043   NA 143 12/12/2022 1100   NA 143 02/16/2017 1450   NA 142 01/19/2016 1141   K 3.4 (L) 02/06/2023 1043   K 3.7 02/16/2017 1450   K 3.7 01/19/2016 1141   CL 103 02/06/2023 1043   CL 105 02/16/2017 1450   CO2 24 02/06/2023 1043   CO2 32 02/16/2017 1450   CO2 27 01/19/2016 1141   GLUCOSE 110 (H) 02/06/2023 1043   GLUCOSE 88 02/16/2017 1450   BUN 14 02/06/2023 1043   BUN 17 12/12/2022 1100   BUN 14 02/16/2017 1450   BUN 19.5 01/19/2016 1141   CREATININE 0.89 02/06/2023 1043   CREATININE 0.89 12/27/2022 1455   CREATININE 1.2 02/16/2017 1450   CREATININE 0.9 01/19/2016 1141   CALCIUM 9.1 02/06/2023 1043   CALCIUM 8.8 02/16/2017 1450   CALCIUM 8.8 01/19/2016 1141   GFRNONAA >60 02/06/2023 1043   GFRNONAA >60 12/27/2022 1455   GFRAA >60 12/25/2019 1353   Lab Results  Component Value Date   HGBA1C 6.3 (H) 12/12/2022   HGBA1C 6.2 (H) 10/14/2007   Lab Results  Component Value Date   INSULIN 20.4 12/12/2022   INSULIN 12.6 05/19/2021   Lab Results  Component Value Date   TSH 4.110 12/12/2022  CBC    Component Value Date/Time   WBC 5.1 02/06/2023 1043   RBC 4.54 02/06/2023 1043   HGB 12.8 02/06/2023 1043   HGB 12.4 12/27/2022 1455   HGB 12.1 02/16/2017 1450   HCT 40.0 02/06/2023 1043   HCT 36.6 02/16/2017 1450   PLT 226 02/06/2023 1043   PLT 237 12/27/2022 1455   PLT 239 02/16/2017 1450   MCV 88.1 02/06/2023 1043   MCV 87 02/16/2017 1450   MCH 28.2 02/06/2023 1043   MCHC 32.0 02/06/2023 1043   RDW 14.7 02/06/2023 1043   RDW 15.4 02/16/2017 1450   Iron Studies    Component Value Date/Time   IRON 59 12/05/2021 1451   IRON 49 02/16/2017 1536   TIBC 323 12/05/2021 1451   TIBC 284 02/16/2017 1536   FERRITIN 51 12/05/2021 1452   FERRITIN 66 02/16/2017 1536   IRONPCTSAT 18 12/05/2021 1451   IRONPCTSAT 17 (L) 02/16/2017 1536   Lipid Panel      Component Value Date/Time   CHOL 133 06/05/2022 1320   TRIG 49.0 06/05/2022 1320   HDL 71.80 06/05/2022 1320   CHOLHDL 2 06/05/2022 1320   VLDL 9.8 06/05/2022 1320   LDLCALC 51 06/05/2022 1320   Hepatic Function Panel     Component Value Date/Time   PROT 7.7 02/06/2023 1043   PROT 7.1 12/12/2022 1100   PROT 7.3 02/16/2017 1450   PROT 7.3 01/19/2016 1141   ALBUMIN 3.9 02/06/2023 1043   ALBUMIN 4.3 12/12/2022 1100   ALBUMIN 3.6 01/19/2016 1141   AST 22 02/06/2023 1043   AST 24 12/27/2022 1455   AST 21 01/19/2016 1141   ALT 20 02/06/2023 1043   ALT 15 12/27/2022 1455   ALT 27 02/16/2017 1450   ALT 18 01/19/2016 1141   ALKPHOS 52 02/06/2023 1043   ALKPHOS 57 02/16/2017 1450   ALKPHOS 59 01/19/2016 1141   BILITOT 0.6 02/06/2023 1043   BILITOT 0.4 12/27/2022 1455   BILITOT 0.31 01/19/2016 1141   BILIDIR 0.0 01/08/2012 1224   IBILI 0.2 12/30/2010 1515      Component Value Date/Time   TSH 4.110 12/12/2022 1100   Nutritional Lab Results  Component Value Date   VD25OH 61.5 12/12/2022   VD25OH 42.2 05/19/2021     Return in about 3 weeks (around 03/15/2023) for For Weight Mangement with Dr. Rikki Spearing - 40 minutes.. She was informed of the importance of frequent follow up visits to maximize her success with intensive lifestyle modifications for her multiple health conditions.   ATTESTASTION STATEMENTS:  Reviewed by clinician on day of visit: allergies, medications, problem list, medical history, surgical history, family history, social history, and previous encounter notes.     Worthy Rancher, MD

## 2023-03-07 ENCOUNTER — Other Ambulatory Visit: Payer: Self-pay | Admitting: Hematology & Oncology

## 2023-03-09 ENCOUNTER — Ambulatory Visit (INDEPENDENT_AMBULATORY_CARE_PROVIDER_SITE_OTHER): Payer: Medicare Other | Admitting: Thoracic Surgery (Cardiothoracic Vascular Surgery)

## 2023-03-09 ENCOUNTER — Encounter (HOSPITAL_COMMUNITY): Payer: Self-pay | Admitting: Thoracic Surgery (Cardiothoracic Vascular Surgery)

## 2023-03-09 ENCOUNTER — Other Ambulatory Visit: Payer: Self-pay | Admitting: *Deleted

## 2023-03-09 ENCOUNTER — Other Ambulatory Visit: Payer: Self-pay

## 2023-03-09 DIAGNOSIS — K222 Esophageal obstruction: Secondary | ICD-10-CM | POA: Diagnosis not present

## 2023-03-09 DIAGNOSIS — Z5189 Encounter for other specified aftercare: Secondary | ICD-10-CM

## 2023-03-09 NOTE — Progress Notes (Addendum)
SDW CALL  Patient was given pre-op instructions over the phone. The opportunity was given for the patient to ask questions. No further questions asked. Patient verbalized understanding of instructions given.   PCP - Jose Paz,MD Cardiologist - denies  PPM/ICD - denies Device Orders -  Rep Notified -   Chest x-ray - DOS EKG - 12/12/22 Stress Test - 11/01/04 ECHO - 04/30/08 Cardiac Cath - denies  Sleep Study - 2004 CPAP - yes  Fasting Blood Sugar - 130-140 Checks Blood Sugar 2-3 times a week  Blood Thinner Instructions: pt states she was told to hold Xarelto x2 days per Dr. Cliffton Asters. Last dose was 11/15. Aspirin Instructions:na  ERAS Protcol -no PRE-SURGERY Ensure or G2-   COVID TEST- na   Anesthesia review: no  Patient denies shortness of breath, fever, cough and chest pain over the phone call    Surgical Instructions    Your procedure is scheduled on November 18  Report to [facility] Main Entrance "A" at 0700 A.M., then check in with the Admitting office.  Call this number if you have problems the morning of surgery:  [phone]      Take these medicines the morning of surgery with A SIP OF WATER: Aciphex,Restasis eye drops,Tylenol if needed. As of today, STOP taking any Aspirin (unless otherwise instructed by your surgeon) Aleve, Naproxen, Ibuprofen, Motrin, Advil, Goody's, BC's, all herbal medications, fish oil, and all vitamins.  Check your blood sugar the morning of your surgery when you wake up and every 2 hours until you get to the Short Stay unit.  If your blood sugar is less than 70 mg/dL, you will need to treat for low blood sugar: Do not take insulin. Treat a low blood sugar (less than 70 mg/dL) with  cup of clear juice (cranberry or apple), 4 glucose tablets, OR glucose gel. Recheck blood sugar in 15 minutes after treatment (to make sure it is greater than 70 mg/dL). If your blood sugar is not greater than 70 mg/dL on recheck, call [phone] for  further instructions. Report your blood sugar to the short stay nurse when you get to Short Stay.  [facility] is not responsible for any belongings or valuables. .   Do NOT Smoke (Tobacco/Vaping)  24 hours prior to your procedure  If you use a CPAP at night, you may bring your mask for your overnight stay.   Contacts, glasses, hearing aids, dentures or partials may not be worn into surgery, please bring cases for these belongings   Patients discharged the day of surgery will not be allowed to drive home, and someone needs to stay with them for 24 hours.    Special instructions:    Oral Hygiene is also important to reduce your risk of infection.  Remember - BRUSH YOUR TEETH THE MORNING OF SURGERY WITH YOUR REGULAR TOOTHPASTE   Day of Surgery:  Take a shower the day of or night before with antibacterial soap. Wear Clean/Comfortable clothing the morning of surgery Do not apply any deodorants/lotions.   Do not wear jewelry or makeup Do not wear lotions, powders, perfumes/colognes, or deodorant. Do not shave 48 hours prior to surgery.  Men may shave face and neck. Do not bring valuables to the hospital. Do not wear nail polish, gel polish, artificial nails, or any other type of covering on natural nails (fingers and toes) If you have artificial nails or gel coating that need to be removed by a nail salon, please have  this removed prior to surgery. Artificial nails or gel coating may interfere with anesthesia's ability to adequately monitor your vital signs. Remember to brush your teeth WITH YOUR REGULAR TOOTHPASTE.

## 2023-03-09 NOTE — Progress Notes (Signed)
     [address]       [place] [address]             [phone]       Patient: Home Provider: Office Consent for Telemedicine visit obtained.  Today's visit was completed via a real-time telehealth (see specific modality noted below). The patient/authorized person provided oral consent at the time of the visit to engage in a telemedicine encounter with the present provider at [facility]. The patient/authorized person was informed of the potential benefits, limitations, and risks of telemedicine. The patient/authorized person expressed understanding that the laws that protect confidentiality also apply to telemedicine. The patient/authorized person acknowledged understanding that telemedicine does not provide emergency services and that he or she would need to call 911 or proceed to the nearest hospital for help if such a need arose.   Total time spent in the clinical discussion 10 minutes.  Telehealth Modality: Phone visit (audio only)  I had a telephone visit with Mrs. Ratledge.  She states that she is doing better.  She occassionally is coughing up some mucus.  We will set her up for her next dilation on Monday.  Shaunice Levitan Keane Scrape

## 2023-03-12 ENCOUNTER — Ambulatory Visit (HOSPITAL_COMMUNITY): Payer: Medicare Other | Admitting: Certified Registered Nurse Anesthetist

## 2023-03-12 ENCOUNTER — Ambulatory Visit (HOSPITAL_BASED_OUTPATIENT_CLINIC_OR_DEPARTMENT_OTHER): Payer: Medicare Other | Admitting: Certified Registered Nurse Anesthetist

## 2023-03-12 ENCOUNTER — Other Ambulatory Visit: Payer: Self-pay | Admitting: Thoracic Surgery (Cardiothoracic Vascular Surgery)

## 2023-03-12 ENCOUNTER — Encounter (HOSPITAL_COMMUNITY): Payer: Self-pay | Admitting: Thoracic Surgery (Cardiothoracic Vascular Surgery)

## 2023-03-12 ENCOUNTER — Ambulatory Visit (HOSPITAL_COMMUNITY): Payer: Medicare Other

## 2023-03-12 ENCOUNTER — Ambulatory Visit (HOSPITAL_COMMUNITY)
Admission: RE | Admit: 2023-03-12 | Discharge: 2023-03-12 | Disposition: A | Discharge status: Home / Self Care | Payer: Medicare Other | Attending: Thoracic Surgery (Cardiothoracic Vascular Surgery) | Admitting: Thoracic Surgery (Cardiothoracic Vascular Surgery)

## 2023-03-12 ENCOUNTER — Encounter (HOSPITAL_COMMUNITY)
Admission: RE | Disposition: A | Discharge status: Home / Self Care | Payer: Self-pay | Source: Home / Self Care | Attending: Thoracic Surgery (Cardiothoracic Vascular Surgery)

## 2023-03-12 DIAGNOSIS — Z86711 Personal history of pulmonary embolism: Secondary | ICD-10-CM | POA: Diagnosis not present

## 2023-03-12 DIAGNOSIS — E669 Obesity, unspecified: Secondary | ICD-10-CM | POA: Diagnosis not present

## 2023-03-12 DIAGNOSIS — D649 Anemia, unspecified: Secondary | ICD-10-CM | POA: Diagnosis not present

## 2023-03-12 DIAGNOSIS — M40204 Unspecified kyphosis, thoracic region: Secondary | ICD-10-CM | POA: Diagnosis not present

## 2023-03-12 DIAGNOSIS — Z6838 Body mass index (BMI) 38.0-38.9, adult: Secondary | ICD-10-CM | POA: Insufficient documentation

## 2023-03-12 DIAGNOSIS — E119 Type 2 diabetes mellitus without complications: Secondary | ICD-10-CM | POA: Diagnosis not present

## 2023-03-12 DIAGNOSIS — K220 Achalasia of cardia: Secondary | ICD-10-CM | POA: Diagnosis not present

## 2023-03-12 DIAGNOSIS — Z79899 Other long term (current) drug therapy: Secondary | ICD-10-CM | POA: Insufficient documentation

## 2023-03-12 DIAGNOSIS — F419 Anxiety disorder, unspecified: Secondary | ICD-10-CM | POA: Diagnosis not present

## 2023-03-12 DIAGNOSIS — M1990 Unspecified osteoarthritis, unspecified site: Secondary | ICD-10-CM | POA: Insufficient documentation

## 2023-03-12 DIAGNOSIS — K222 Esophageal obstruction: Secondary | ICD-10-CM | POA: Diagnosis not present

## 2023-03-12 DIAGNOSIS — I341 Nonrheumatic mitral (valve) prolapse: Secondary | ICD-10-CM | POA: Diagnosis not present

## 2023-03-12 DIAGNOSIS — Z7901 Long term (current) use of anticoagulants: Secondary | ICD-10-CM | POA: Diagnosis not present

## 2023-03-12 DIAGNOSIS — K2100 Gastro-esophageal reflux disease with esophagitis, without bleeding: Secondary | ICD-10-CM | POA: Insufficient documentation

## 2023-03-12 DIAGNOSIS — R1110 Vomiting, unspecified: Secondary | ICD-10-CM | POA: Diagnosis not present

## 2023-03-12 DIAGNOSIS — Z5189 Encounter for other specified aftercare: Secondary | ICD-10-CM

## 2023-03-12 DIAGNOSIS — G4733 Obstructive sleep apnea (adult) (pediatric): Secondary | ICD-10-CM | POA: Diagnosis not present

## 2023-03-12 DIAGNOSIS — Z9889 Other specified postprocedural states: Secondary | ICD-10-CM | POA: Diagnosis not present

## 2023-03-12 DIAGNOSIS — E66813 Obesity, class 3: Secondary | ICD-10-CM | POA: Diagnosis not present

## 2023-03-12 DIAGNOSIS — I10 Essential (primary) hypertension: Secondary | ICD-10-CM

## 2023-03-12 DIAGNOSIS — Z01818 Encounter for other preprocedural examination: Secondary | ICD-10-CM | POA: Diagnosis not present

## 2023-03-12 DIAGNOSIS — R0609 Other forms of dyspnea: Secondary | ICD-10-CM | POA: Diagnosis not present

## 2023-03-12 DIAGNOSIS — M797 Fibromyalgia: Secondary | ICD-10-CM | POA: Insufficient documentation

## 2023-03-12 DIAGNOSIS — I2510 Atherosclerotic heart disease of native coronary artery without angina pectoris: Secondary | ICD-10-CM | POA: Diagnosis not present

## 2023-03-12 DIAGNOSIS — R519 Headache, unspecified: Secondary | ICD-10-CM | POA: Diagnosis not present

## 2023-03-12 HISTORY — PX: ESOPHAGOGASTRODUODENOSCOPY: SHX5428

## 2023-03-12 HISTORY — PX: ESOPHAGEAL DILATION: SHX303

## 2023-03-12 LAB — CBC
HCT: 38.4 % (ref 36.0–46.0)
Hemoglobin: 12.3 g/dL (ref 12.0–15.0)
MCH: 28.1 pg (ref 26.0–34.0)
MCHC: 32 g/dL (ref 30.0–36.0)
MCV: 87.9 fL (ref 80.0–100.0)
Platelets: 218 10*3/uL (ref 150–400)
RBC: 4.37 MIL/uL (ref 3.87–5.11)
RDW: 15.1 % (ref 11.5–15.5)
WBC: 4.5 10*3/uL (ref 4.0–10.5)
nRBC: 0 % (ref 0.0–0.2)

## 2023-03-12 LAB — GLUCOSE, CAPILLARY: Glucose-Capillary: 135 mg/dL — ABNORMAL HIGH (ref 70–99)

## 2023-03-12 LAB — COMPREHENSIVE METABOLIC PANEL
ALT: 15 U/L (ref 0–44)
AST: 22 U/L (ref 15–41)
Albumin: 3.5 g/dL (ref 3.5–5.0)
Alkaline Phosphatase: 47 U/L (ref 38–126)
Anion gap: 9 (ref 5–15)
BUN: 14 mg/dL (ref 8–23)
CO2: 24 mmol/L (ref 22–32)
Calcium: 8.5 mg/dL — ABNORMAL LOW (ref 8.9–10.3)
Chloride: 105 mmol/L (ref 98–111)
Creatinine, Ser: 0.92 mg/dL (ref 0.44–1.00)
GFR, Estimated: >60 mL/min (ref >60)
Glucose, Bld: 123 mg/dL — ABNORMAL HIGH (ref 70–99)
Potassium: 3.8 mmol/L (ref 3.5–5.1)
Sodium: 138 mmol/L (ref 135–145)
Total Bilirubin: 0.5 mg/dL (ref <1.2)
Total Protein: 7 g/dL (ref 6.5–8.1)

## 2023-03-12 LAB — PROTIME-INR
INR: 1 (ref 0.8–1.2)
Prothrombin Time: 12.9 s (ref 11.4–15.2)

## 2023-03-12 LAB — APTT: aPTT: 31 s (ref 24–36)

## 2023-03-12 SURGERY — EGD (ESOPHAGOGASTRODUODENOSCOPY)
Anesthesia: General

## 2023-03-12 MED ORDER — LACTATED RINGERS IV SOLN: Freq: Once | INTRAVENOUS | Status: AC

## 2023-03-12 MED ORDER — DEXAMETHASONE SODIUM PHOSPHATE 10 MG/ML IJ SOLN
INTRAMUSCULAR | Status: AC
  Filled 2023-03-12: qty 1

## 2023-03-12 MED ORDER — ROCURONIUM BROMIDE 10 MG/ML (PF) SYRINGE
PREFILLED_SYRINGE | INTRAVENOUS | Status: DC | PRN
  Administered 2023-03-12: 40 mg via INTRAVENOUS

## 2023-03-12 MED ORDER — SUGAMMADEX SODIUM 200 MG/2ML IV SOLN
INTRAVENOUS | Status: DC | PRN
  Administered 2023-03-12: 200 mg via INTRAVENOUS
  Administered 2023-03-12: 50 mg via INTRAVENOUS

## 2023-03-12 MED ORDER — CHLORHEXIDINE GLUCONATE 0.12 % MT SOLN
15.0000 mL | OROMUCOSAL | Status: AC
  Administered 2023-03-12: 15 mL via OROMUCOSAL
  Filled 2023-03-12: qty 15

## 2023-03-12 MED ORDER — NYSTATIN 100000 UNIT/ML MT SUSP: 5.0000 mL | Freq: Four times a day (QID) | OROMUCOSAL | 0 refills | Status: DC

## 2023-03-12 MED ORDER — 0.9 % SODIUM CHLORIDE (POUR BTL) OPTIME
TOPICAL | Status: DC | PRN
  Administered 2023-03-12: 1000 mL

## 2023-03-12 MED ORDER — FENTANYL CITRATE (PF) 250 MCG/5ML IJ SOLN
INTRAMUSCULAR | Status: AC
  Filled 2023-03-12: qty 5

## 2023-03-12 MED ORDER — FLUCONAZOLE 100 MG PO TABS: 100.0000 mg | ORAL_TABLET | Freq: Every day | ORAL | 0 refills | Status: DC

## 2023-03-12 MED ORDER — FENTANYL CITRATE (PF) 250 MCG/5ML IJ SOLN
INTRAMUSCULAR | Status: DC | PRN
  Administered 2023-03-12: 25 ug via INTRAVENOUS
  Administered 2023-03-12: 50 ug via INTRAVENOUS

## 2023-03-12 MED ORDER — PROPOFOL 10 MG/ML IV BOLUS
INTRAVENOUS | Status: DC | PRN
  Administered 2023-03-12: 150 mg via INTRAVENOUS
  Administered 2023-03-12: 40 mg via INTRAVENOUS

## 2023-03-12 MED ORDER — LACTATED RINGERS IV SOLN: INTRAVENOUS | Status: DC | PRN

## 2023-03-12 MED ORDER — DEXAMETHASONE SODIUM PHOSPHATE 10 MG/ML IJ SOLN
INTRAMUSCULAR | Status: DC | PRN
  Administered 2023-03-12: 5 mg via INTRAVENOUS

## 2023-03-12 MED ORDER — LIDOCAINE 2% (20 MG/ML) 5 ML SYRINGE
INTRAMUSCULAR | Status: DC | PRN
  Administered 2023-03-12: 60 mg via INTRAVENOUS

## 2023-03-12 MED ORDER — SUCCINYLCHOLINE CHLORIDE 200 MG/10ML IV SOSY
PREFILLED_SYRINGE | INTRAVENOUS | Status: DC | PRN
  Administered 2023-03-12: 140 mg via INTRAVENOUS

## 2023-03-12 MED ORDER — ONDANSETRON HCL 4 MG/2ML IJ SOLN
INTRAMUSCULAR | Status: AC
  Filled 2023-03-12: qty 2

## 2023-03-12 MED ORDER — ONDANSETRON HCL 4 MG/2ML IJ SOLN
INTRAMUSCULAR | Status: DC | PRN
  Administered 2023-03-12: 4 mg via INTRAVENOUS

## 2023-03-12 SURGICAL SUPPLY — 21 items
BUTTON OLYMPUS DEFENDO 5 PIECE (MISCELLANEOUS) ×1 IMPLANT
CANISTER SUCT 3000ML PPV (MISCELLANEOUS) ×1 IMPLANT
CNTNR URN SCR LID CUP LEK RST (MISCELLANEOUS) ×1 IMPLANT
CONT SPEC 4OZ STRL OR WHT (MISCELLANEOUS) ×1
GAUZE SPONGE 4X4 12PLY STRL (GAUZE/BANDAGES/DRESSINGS) ×1 IMPLANT
GLOVE BIO SURGEON STRL SZ7 (GLOVE) ×1 IMPLANT
GLOVE BIO SURGEON STRL SZ7.5 (GLOVE) ×1 IMPLANT
GOWN STRL REUS W/ TWL XL LVL3 (GOWN DISPOSABLE) ×1 IMPLANT
GOWN STRL REUS W/TWL XL LVL3 (GOWN DISPOSABLE) ×1
MARKER SKIN DUAL TIP RULER LAB (MISCELLANEOUS) ×1 IMPLANT
NS IRRIG 1000ML POUR BTL (IV SOLUTION) ×1 IMPLANT
OIL SILICONE PENTAX (PARTS (SERVICE/REPAIRS)) IMPLANT
PAD ARMBOARD 7.5X6 YLW CONV (MISCELLANEOUS) ×2 IMPLANT
SYR 20ML ECCENTRIC (SYRINGE) ×1 IMPLANT
TOWEL GREEN STERILE (TOWEL DISPOSABLE) ×1 IMPLANT
TOWEL GREEN STERILE FF (TOWEL DISPOSABLE) ×1 IMPLANT
TUBE CONNECTING 20X1/4 (TUBING) ×1 IMPLANT
TUBING ENDO SMARTCAP (MISCELLANEOUS) ×1 IMPLANT
UNDERPAD 30X36 HEAVY ABSORB (UNDERPADS AND DIAPERS) ×1 IMPLANT
WATER STERILE IRR 1000ML POUR (IV SOLUTION) ×1 IMPLANT
WIRE HYDRA 450CM (MISCELLANEOUS) IMPLANT

## 2023-03-12 NOTE — Transfer of Care (Signed)
Immediate Anesthesia Transfer of Care Note  Patient: Jennifer [name]  Procedure(s) Performed: ESOPHAGOGASTRODUODENOSCOPY (EGD) ESOPHAGEAL DILATION  Patient Location: PACU  Anesthesia Type:General  Level of Consciousness: drowsy and patient cooperative  Airway & Oxygen Therapy: Patient Spontanous Breathing and Patient connected to face mask oxygen  Post-op Assessment: Report given to RN and Post -op Vital signs reviewed and stable  Post vital signs: Reviewed and stable  Last Vitals:  Vitals Value Taken Time  BP 166/90 03/12/23 1016  Temp    Pulse 89 03/12/23 1018  Resp 20 03/12/23 1018  SpO2 100 % 03/12/23 1018  Vitals shown include unfiled device data.  Last Pain:  Vitals:   03/12/23 0752  TempSrc:   PainSc: 0-No pain      Patients Stated Pain Goal: 0 (03/12/23 0752)  Complications: No notable events documented.

## 2023-03-12 NOTE — Anesthesia Preprocedure Evaluation (Addendum)
Anesthesia Evaluation  Patient identified by MRN, date of birth, ID band Patient awake    Reviewed: Allergy & Precautions, H&P , NPO status , Patient's Chart, lab work & pertinent test results  Airway Mallampati: II  TM Distance: >3 FB Neck ROM: Full    Dental no notable dental hx. (+) Teeth Intact, Dental Advisory Given   Pulmonary sleep apnea and Continuous Positive Airway Pressure Ventilation    Pulmonary exam normal breath sounds clear to auscultation       Cardiovascular hypertension, Pt. on medications + DOE  + Valvular Problems/Murmurs MVP  Rhythm:Regular Rate:Normal     Neuro/Psych  Headaches  Anxiety        GI/Hepatic Neg liver ROS, hiatal hernia,GERD  Medicated,,  Endo/Other  diabetes  Class 3 obesity  Renal/GU negative Renal ROS  negative genitourinary   Musculoskeletal  (+) Arthritis , Osteoarthritis,  Fibromyalgia -  Abdominal   Peds  Hematology  (+) Blood dyscrasia, anemia   Anesthesia Other Findings   Reproductive/Obstetrics negative OB ROS                             Anesthesia Physical Anesthesia Plan  ASA: 3  Anesthesia Plan: General   Post-op Pain Management: Minimal or no pain anticipated   Induction: Intravenous  PONV Risk Score and Plan: 4 or greater and Ondansetron and Dexamethasone  Airway Management Planned: Oral ETT  Additional Equipment:   Intra-op Plan:   Post-operative Plan: Extubation in OR  Informed Consent: I have reviewed the patients History and Physical, chart, labs and discussed the procedure including the risks, benefits and alternatives for the proposed anesthesia with the patient or authorized representative who has indicated his/her understanding and acceptance.     Dental advisory given  Plan Discussed with: CRNA  Anesthesia Plan Comments:        Anesthesia Quick Evaluation

## 2023-03-12 NOTE — H&P (Signed)
 [address]       [place] [address]             [phone]                                                   Jennifer [name] [facility] Medical Record #[id number] Date of Birth: Jul 11, 1950   Referring: Napoleon Form, MD Primary Care: Wanda Plump, MD Primary Cardiologist: None   Chief Complaint:        Chief Complaint  Patient presents with   Esophageal Achalasia      New patient consultation [facility] 5/14, Chest CT 8/21      History of Present Illness:    Jennifer [name] 72 y.o. female presents for surgical evaluation of a distal esophageal stricture.  She has undergone a Nissen fundoplication with hiatal hernia repair on 3 separate occasions via a laparotomy.  Her last surgery was in 2003, where she had extensive lysis of adhesions and a gortex mesh placed to reconstruct her hiatus.  Up until about 10 years ago she was doing well, but then started developing some dysphagia, and odynophagia.  Over the last 5 years her symptoms have worsened.  In 2012, she was seen at [facility], where she was noted to have candidiasis, and a stricture.  She was scheduled to undergo monthly dilation, but stopped after 6 months due to pain.     She currently complains of constant chest pain, and burning with acidic foods.  She regularly has dysphagia, and has to throw up food that gets caught.  Her weight has been stable.  She also complains of significant mucus that she coughs up every morning.             Past Medical History:  Diagnosis Date   Allergy     Anemia     Arthritis     Cataract      bil cataracts removed   Chronic chest pain     Chronic fatigue 02/03/2017   Clotting disorder [facility])      Pulmonary [place] 2010 & 2015   Colon polyps      inflamed partially ulcerated hyperplastic polyp   Diabetes (HCC) 09/24/2013    no meds   Diverticulosis     Dysphagia esophageal phase - chronic after 3 fundoplications 01/20/2014   Edema of both lower extremities      Esophageal stenosis      Stricture after fundoplication REDO   Fibromyalgia     GERD (gastroesophageal reflux disease)     Glaucoma suspect     Heart murmur      no problems    Hemorrhoids, internal, with bleeding 06/27/2017   History of hiatal hernia     Hypertension     IBS (irritable bowel syndrome)     Lactose intolerance     Mitral valve prolapse     OSA (obstructive sleep apnea)      on CPAP-Mild - Sleep study 2004   Pre-diabetes     Pulmonary embolus (HCC)      2010 and 12-2013   Sleep apnea      wears CPAP   Thyroid nodule        dr. watching    Tinnitus      Subjective  [place] 2014    R flank               Past Surgical History:  Procedure Laterality Date   ABDOMINAL HYSTERECTOMY        still has 1 ovary    APPENDECTOMY       BRAIN SURGERY   1995    Stem surgery, Arnold Chiari Malformation   CATARACT EXTRACTION Bilateral 2017    cataracts, Dr. Elmer Picker   CHOLECYSTECTOMY       COLONOSCOPY       ESOPHAGEAL MANOMETRY N/A 08/06/2019    Procedure: ESOPHAGEAL MANOMETRY (EM);  Surgeon: Iva Boop, MD;  Location: [facility] ENDOSCOPY;  Service: Endoscopy;  Laterality: N/A;   HEMORRHOID BANDING       HERNIA REPAIR        Hital Hernia   KNEE ARTHROSCOPY   05/16/2012    Procedure: ARTHROSCOPY KNEE;  Surgeon: Kennieth Rad, MD;  Location: [facility] OR;  Service: Orthopedics;  Laterality: Left;   KNEE ARTHROSCOPY Right 01/2018   NISSEN FUNDOPLICATION        X 3 lab, open x 2 last with mesh   POLYPECTOMY       REDUCTION MAMMAPLASTY Bilateral 2007   TOTAL KNEE ARTHROPLASTY Right 08/25/2019    Procedure: RIGHT TOTAL KNEE ARTHROPLASTY;  Surgeon: Ollen Gross, MD;  Location: [facility] ORS;  Service: Orthopedics;  Laterality: Right;    UPPER GASTROINTESTINAL ENDOSCOPY                   Family History  Problem Relation Age of Onset   Diabetes Mother     Rheum arthritis Mother     Hypertension Mother     Hypertension Father     Alcoholism Father     Heart disease  Father     Sudden death Father     Rheum arthritis Sister     Hypertension Sister     Arthritis Sister     Rheum arthritis Sister     Rheum arthritis Sister     CAD Sister          MI age 52   Fibromyalgia Sister     Colon cancer Maternal Grandmother          dx with colon ca laster in life - lived to be 104   Crohn's disease Maternal Aunt     Stomach cancer Maternal Uncle     Lung cancer Maternal Uncle     Breast cancer Neg Hx     Esophageal cancer Neg Hx     Rectal cancer Neg Hx     Prostate cancer Neg Hx     Pancreatic cancer Neg Hx     Colon polyps Neg Hx              Tobacco Use History  Social History        Tobacco Use  Smoking Status Never  Smokeless Tobacco Never  Tobacco Comments    never used tobacco      Social History        Substance and Sexual Activity  Alcohol Use No   Alcohol/week: 0.0 standard drinks of alcohol        Allergies       Allergies  Allergen Reactions   Morphine And Codeine Anaphylaxis and Shortness Of Breath   Sulfonamide Derivatives Hives      Burn from inside out   Promethazine Hcl Other (See Comments)  Hallucinations              Current Outpatient Medications  Medication Sig Dispense Refill   acetaminophen (TYLENOL) 500 MG tablet Take 1 tablet (500 mg total) by mouth every 6 (six) hours as needed. (Patient taking differently: Take 500 mg by mouth every 6 (six) hours as needed for moderate pain, mild pain or headache.) 30 tablet 0   ACIPHEX 20 MG tablet Take 1 tablet (20 mg total) by mouth 2 (two) times daily. 60 tablet 11   Apoaequorin (PREVAGEN) 10 MG CAPS Take 10 mg by mouth daily. prevagen       cycloSPORINE (RESTASIS) 0.05 % ophthalmic emulsion Place 1 drop into both eyes 2 (two) times daily.       hydrocortisone valerate cream (WESTCORT) 0.2 % Apply 1 Application topically at bedtime.       hyoscyamine (LEVSIN SL) 0.125 MG SL tablet DISSOLVE ONE TABLET UNDER TONGUE EVERY FOUR HOURS AS NEEDED 90 tablet 0    Multiple Vitamins-Minerals (MULTIVITAMIN WITH MINERALS) tablet Take 2 tablets by mouth daily. Vitafusion woman gummy       rosuvastatin (CRESTOR) 10 MG tablet Take 1 tablet (10 mg total) by mouth at bedtime. 90 tablet 1   temazepam (RESTORIL) 30 MG capsule TAKE ONE CAPSULE BY MOUTH AT BEDTIME AS NEEDED FOR SLEEP 30 capsule 4   triamterene-hydrochlorothiazide (MAXZIDE-25) 37.5-25 MG tablet Take 0.5 tablets by mouth daily. 45 tablet 1   XARELTO 2.5 MG TABS tablet TAKE TWO TABLETS BY MOUTH DAILY 60 tablet 0      No current facility-administered medications for this visit.        Review of Systems  Constitutional:  Negative for malaise/fatigue and weight loss.  Respiratory:  Positive for cough.   Cardiovascular:  Positive for chest pain.  Gastrointestinal:  Positive for abdominal pain, heartburn and vomiting.        PHYSICAL EXAMINATION: BP (!) 159/83 (BP Location: Left Arm, Patient Position: Sitting, Cuff Size: Large)   Pulse 77   Resp 20   Ht 5\' 8"  (1.727 m)   Wt 260 lb (117.9 kg)   SpO2 93% Comment: RA  BMI 39.53 kg/m  Physical Exam Constitutional:      General: She is not in acute distress.    Appearance: Normal appearance. She is not ill-appearing.  HENT:     Head: Normocephalic and atraumatic.  Eyes:     Extraocular Movements: Extraocular movements intact.  Cardiovascular:     Rate and Rhythm: Normal rate.  Abdominal:     General: There is no distension.     Musculoskeletal:        General: Normal range of motion.     Cervical back: Normal range of motion.  Skin:    General: Skin is warm and dry.  Neurological:     General: No focal deficit present.     Mental Status: She is alert and oriented to person, place, and time.        Diagnostic Studies & Laboratory data:    CT Scan:  IMPRESSION: Mild circumferential distal esophageal wall thickening compatible with given history of esophagitis.   Scattered coronary artery disease.   1.7 cm left thyroid  nodule. This has been evaluated on previous imaging. (ref: J Am Coll Radiol. 2015 Feb;12(2): 143-50).     EGD/EUS:  - LA Grade C ( one or more mucosal breaks continuous between tops of 2 or more mucosal folds, less than 75% circumference) esophagitis with no bleeding was  found 32 to 38 cm from the incisors. Biopsies were taken with a cold forceps for histology. - One benign- appearing, intrinsic mild stenosis was found 38 to 39 cm from the incisors. The stenosis was traversed. A TTS dilator was passed through the scope. Dilation with an 18- 19- 20 mm balloon dilator was performed to 20 mm. The dilation site was examined following endoscope reinsertion and showed mild mucosal disruption. - Evidence of a Nissen fundoplication was found in the cardia. The wrap appeared intact. This was traversed. - The stomach was otherwise normal. - The cardia and gastric fundus were normal on retroflexion.         I have independently reviewed the above radiology studies  and reviewed the findings with the patient.    Recent Lab Findings: Recent Labs       Lab Results  Component Value Date    WBC 3.7 (L) 12/27/2022    HGB 12.4 12/27/2022    HCT 39.4 12/27/2022    PLT 237 12/27/2022    GLUCOSE 113 (H) 12/27/2022    CHOL 133 06/05/2022    TRIG 49.0 06/05/2022    HDL 71.80 06/05/2022    LDLCALC 51 06/05/2022    ALT 15 12/27/2022    AST 24 12/27/2022    NA 142 12/27/2022    K 4.2 12/27/2022    CL 103 12/27/2022    CREATININE 0.89 12/27/2022    BUN 15 12/27/2022    CO2 31 12/27/2022    TSH 4.110 12/12/2022    INR 0.9 09/05/2021    HGBA1C 6.3 (H) 12/12/2022              Assessment / Plan:   72yo female with distal esophageal stricture in the setting of 3 hiatal hernia repairs and fundoplication, the last of which was performed with mesh.  She has had multiple dilation in the past, which are now becoming less effective.  I explained to her that surgery would be extremely risky based on the  previous op report and the fact that mesh was used for reconstruction at the last repair.  On further questioning, she did not have any dysphagia after her last surgery, thus it possible that her symptoms are due to worsening scar tissue.  I have recommended that she undergo savary dilation on a weekly basis to see if her symptoms improve.  Surgery would only be reserved as a last ditch effort, and give the fact that she has not lost any weight, and appears in good shape, I explained to her that any complication would be worst that her current symptoms.  She is in agreement to proceed with the dilations.

## 2023-03-12 NOTE — Discharge Summary (Signed)
Physician Discharge Summary   Patient ID: TIARRA KIOUS [id number] 72 y.o. [birth year]  Admit date: 03/12/2023  Discharge date and time: No discharge date for patient encounter.   Admitting Physician: Corliss Skains, MD   Discharge Physician: Corliss Skains   Admission Diagnoses: ESOPHAGEAL STRICTURE  Discharge Diagnoses: esophageal stricture  Admission Condition: good  Discharged Condition: good  Indication for Admission: peri-op  Hospital Course: uncomplicated   Disposition:   Patient Instructions:   Activity: activity as tolerated Diet: regular diet Wound Care: none needed  Follow-up with Dr. Cliffton Asters in 1 week.  SignedCorliss Skains 03/12/2023 10:20 AM

## 2023-03-12 NOTE — Anesthesia Procedure Notes (Signed)
Procedure Name: Intubation Date/Time: 03/12/2023 9:16 AM  Performed by: Audie Pinto, CRNAPre-anesthesia Checklist: Patient identified, Emergency Drugs available, Suction available and Patient being monitored Patient Re-evaluated:Patient Re-evaluated prior to induction Oxygen Delivery Method: Circle system utilized Preoxygenation: Pre-oxygenation with 100% oxygen Induction Type: IV induction and Rapid sequence Laryngoscope Size: Mac and 4 Grade View: Grade I Tube type: Oral Tube size: 7.0 mm Number of attempts: 1 Airway Equipment and Method: Stylet and Oral airway Placement Confirmation: ETT inserted through vocal cords under direct vision, positive ETCO2 and breath sounds checked- equal and bilateral Secured at: 21 cm Tube secured with: Tape Dental Injury: Teeth and Oropharynx as per pre-operative assessment

## 2023-03-12 NOTE — Op Note (Signed)
      [address]       [place] [address]             [phone]        03/12/2023   Patient:  Jennifer [name] Pre-Op Dx: Esophageal Stricture Hx of hiatal hernia repair with fundoplication X 3   Post-op Dx:  same Procedure: - Esophagogastroscopy - Savary dilation of the esophagus to 60 F/66mm     Surgeon and Role:      * Nell Gales, Eliezer Lofts, MD - Primary   Anesthesia  general EBL:  minimal  Blood Administration: none Specimen:  none     Counts: correct     Indications: 72yo female with distal esophageal stricture in the setting of 3 hiatal hernia repairs and fundoplication, the last of which was performed with mesh. She has had multiple dilation in the past, which are now becoming less effective. I explained to her that surgery would be extremely risky based on the previous op report and the fact that mesh was used for reconstruction at the last repair. On further questioning, she did not have any dysphagia after her last surgery, thus it possible that her symptoms are due to worsening scar tissue. I have recommended that she undergo savary dilation on a weekly basis to see if her symptoms improve. Surgery would only be reserved as a last ditch effort, and give the fact that she has not lost any weight, and appears in good shape, I explained to her that any complication would be worst that her current symptoms. She is in agreement to proceed with the dilations.    Findings: I was able to pass into the stomach without any obstruction.  There were some white plaques noted in the esophagus and stomach.  We dilated up to a 63F without much resistance.  Post dilation there was scant blood at the GE junction.   Operative Technique: After the risks, benefits and alternatives were thoroughly discussed, the patient was brought to the operative theatre.  Anesthesia was induced. The patient was prepped and draped in normal sterile fashion.  An appropriate surgical pause  was performed, and pre-operative antibiotics were dosed accordingly.   The gastroscope was advanced through the oropharynx into the cervical esophagus under direct visualization.  The scope was passed into the stomach.  The scope was then pulled back, and the esophageal mucosa was visualized.  A mucosal was  normal appearing with some white plaques.  Next a Jag wire was passed through the gastroscope into the stomach with fluoroscopic guidance.  Savary dilators were used under fluoroscopic visualization.  We started with a 17F and dilated up to 63F without meeting any resistance.     The gastroscope was then placed, and the esophagus was visualized.  Mild bleeding was evident.      The patient tolerated the procedure without any immediate complications, and was transferred to the PACU in stable condition.   Jasmain Ahlberg Keane Scrape

## 2023-03-12 NOTE — Anesthesia Postprocedure Evaluation (Signed)
Anesthesia Post Note  Patient: Jennifer [name]  Procedure(s) Performed: ESOPHAGOGASTRODUODENOSCOPY (EGD) ESOPHAGEAL DILATION     Patient location during evaluation: PACU Anesthesia Type: General Level of consciousness: awake and alert Pain management: pain level controlled Vital Signs Assessment: post-procedure vital signs reviewed and stable Respiratory status: spontaneous breathing, nonlabored ventilation and respiratory function stable Cardiovascular status: blood pressure returned to baseline and stable Postop Assessment: no apparent nausea or vomiting Anesthetic complications: no  No notable events documented.  Last Vitals:  Vitals:   03/12/23 1030 03/12/23 1045  BP: (!) 153/83 134/68  Pulse: 85 80  Resp: 11 15  Temp:  36.5 C  SpO2: 94% 97%    Last Pain:  Vitals:   03/12/23 1045  TempSrc:   PainSc: 0-No pain                 Lillan Mccreadie,W. EDMOND

## 2023-03-13 ENCOUNTER — Encounter (HOSPITAL_COMMUNITY): Payer: Self-pay | Admitting: Thoracic Surgery (Cardiothoracic Vascular Surgery)

## 2023-03-14 DIAGNOSIS — H1133 Conjunctival hemorrhage, bilateral: Secondary | ICD-10-CM | POA: Diagnosis not present

## 2023-03-14 DIAGNOSIS — H1045 Other chronic allergic conjunctivitis: Secondary | ICD-10-CM | POA: Diagnosis not present

## 2023-03-16 ENCOUNTER — Other Ambulatory Visit: Payer: Self-pay | Admitting: *Deleted

## 2023-03-16 ENCOUNTER — Encounter: Payer: Self-pay | Admitting: Hematology & Oncology

## 2023-03-16 ENCOUNTER — Inpatient Hospital Stay: Payer: Medicare Other | Attending: Hematology & Oncology

## 2023-03-16 ENCOUNTER — Inpatient Hospital Stay (HOSPITAL_BASED_OUTPATIENT_CLINIC_OR_DEPARTMENT_OTHER): Payer: Medicare Other | Admitting: Hematology & Oncology

## 2023-03-16 VITALS — BP 129/78 | HR 77 | Temp 98.0°F | Resp 18 | Ht 68.0 in | Wt 259.1 lb

## 2023-03-16 DIAGNOSIS — Z7901 Long term (current) use of anticoagulants: Secondary | ICD-10-CM | POA: Diagnosis not present

## 2023-03-16 DIAGNOSIS — I2601 Septic pulmonary embolism with acute cor pulmonale: Secondary | ICD-10-CM

## 2023-03-16 DIAGNOSIS — D500 Iron deficiency anemia secondary to blood loss (chronic): Secondary | ICD-10-CM

## 2023-03-16 DIAGNOSIS — Z86711 Personal history of pulmonary embolism: Secondary | ICD-10-CM | POA: Diagnosis not present

## 2023-03-16 DIAGNOSIS — H1130 Conjunctival hemorrhage, unspecified eye: Secondary | ICD-10-CM | POA: Diagnosis not present

## 2023-03-16 DIAGNOSIS — I2690 Septic pulmonary embolism without acute cor pulmonale: Secondary | ICD-10-CM

## 2023-03-16 DIAGNOSIS — I2699 Other pulmonary embolism without acute cor pulmonale: Secondary | ICD-10-CM

## 2023-03-16 LAB — CBC WITH DIFFERENTIAL (CANCER CENTER ONLY)
Abs Immature Granulocytes: 0.03 10*3/uL (ref 0.00–0.07)
Basophils Absolute: 0 10*3/uL (ref 0.0–0.1)
Basophils Relative: 1 %
Eosinophils Absolute: 0.1 10*3/uL (ref 0.0–0.5)
Eosinophils Relative: 2 %
HCT: 39.5 % (ref 36.0–46.0)
Hemoglobin: 12.7 g/dL (ref 12.0–15.0)
Immature Granulocytes: 1 %
Lymphocytes Relative: 47 %
Lymphs Abs: 2.9 10*3/uL (ref 0.7–4.0)
MCH: 28.5 pg (ref 26.0–34.0)
MCHC: 32.2 g/dL (ref 30.0–36.0)
MCV: 88.8 fL (ref 80.0–100.0)
Monocytes Absolute: 0.6 10*3/uL (ref 0.1–1.0)
Monocytes Relative: 11 %
Neutro Abs: 2.4 10*3/uL (ref 1.7–7.7)
Neutrophils Relative %: 38 %
Platelet Count: 228 10*3/uL (ref 150–400)
RBC: 4.45 MIL/uL (ref 3.87–5.11)
RDW: 14.9 % (ref 11.5–15.5)
WBC Count: 6.1 10*3/uL (ref 4.0–10.5)
nRBC: 0 % (ref 0.0–0.2)

## 2023-03-16 LAB — LACTATE DEHYDROGENASE: LDH: 176 U/L (ref 98–192)

## 2023-03-16 LAB — CMP (CANCER CENTER ONLY)
ALT: 16 U/L (ref 0–44)
AST: 18 U/L (ref 15–41)
Albumin: 4.1 g/dL (ref 3.5–5.0)
Alkaline Phosphatase: 53 U/L (ref 38–126)
Anion gap: 7 (ref 5–15)
BUN: 20 mg/dL (ref 8–23)
CO2: 32 mmol/L (ref 22–32)
Calcium: 9.9 mg/dL (ref 8.9–10.3)
Chloride: 105 mmol/L (ref 98–111)
Creatinine: 1.06 mg/dL — ABNORMAL HIGH (ref 0.44–1.00)
GFR, Estimated: 57 mL/min — ABNORMAL LOW (ref >60)
Glucose, Bld: 120 mg/dL — ABNORMAL HIGH (ref 70–99)
Potassium: 4.1 mmol/L (ref 3.5–5.1)
Sodium: 144 mmol/L (ref 135–145)
Total Bilirubin: 0.4 mg/dL (ref <1.2)
Total Protein: 7.5 g/dL (ref 6.5–8.1)

## 2023-03-16 NOTE — Progress Notes (Signed)
Hematology and Oncology Follow Up Visit  Jennifer [name] [id number] May 17, 1950 72 y.o. 03/16/2023   Principle Diagnosis:  Recurrent pulmonary embolism   Current Therapy:        Xarelto 5 mg po q day - lifelong   Interim History:  [name] is here today for follow-up.  We last saw her back in September.  She has been undergoing I think esophageal dilations.  She is not a surgical candidate.  She has been seeing Dr. Cliffton Asters of Thoracic surgery.  She has some subconjunctival hemorrhaging.  This could certainly be from the Xarelto.  She is on very low-dose Xarelto however.  She has had no bleeding elsewhere.  There is been no melena or bright red blood per rectum.  She has had no hematuria.  There is been no hemoptysis.  She has had no bleeding from the gums.  She is wonder if she can stop the Xarelto.  I think this could certainly be done for a little bit.  Otherwise, she is trying to lose weight.  She was wonder if she should try one of the new weight loss drugs.  I told her that I did not did not have a problem with that of.  I know these drugs have been quite successful in people losing weight.  I think the real question is whether or not insurance will cover these.  She has had no fever.  She has had no problems with COVID.  Currently, she needs to exercise a little bit more.  She is still try to get through her knee issues.  Hopefully, should be able to be a little more active.  Currently, I would say performance status about ECOG 1.     Medications:  Allergies as of 03/16/2023       Reactions   Morphine And Codeine Anaphylaxis, Shortness Of Breath   Sulfonamide Derivatives Hives   Burn from inside out   Promethazine Hcl Other (See Comments)   Hallucinations        Medication List        Accurate as of March 16, 2023  3:46 PM. If you have any questions, ask your nurse or doctor.          acetaminophen 500 MG tablet Commonly known as: TYLENOL Take 1 tablet  (500 mg total) by mouth every 6 (six) hours as needed. What changed: reasons to take this   Aciphex 20 MG tablet Generic drug: RABEprazole Take 1 tablet (20 mg total) by mouth 2 (two) times daily.   fluconazole 100 MG tablet Commonly known as: Diflucan Take 1 tablet (100 mg total) by mouth daily.   hydrocortisone valerate cream 0.2 % Commonly known as: WESTCORT Apply 1 Application topically daily as needed (itching).   hyoscyamine 0.125 MG SL tablet Commonly known as: LEVSIN SL DISSOLVE ONE TABLET UNDER TONGUE EVERY FOUR HOURS AS NEEDED   multivitamin with minerals tablet Take 2 tablets by mouth daily. Vitafusion woman gummy   Pataday 0.1 % ophthalmic solution Generic drug: olopatadine Place 1 drop into the left eye 2 (two) times daily.   prednisoLONE acetate 1 % ophthalmic suspension Commonly known as: PRED FORTE Place 1 drop into the right eye in the morning and at bedtime.   Prevagen 10 MG Caps Generic drug: Apoaequorin Take 10 mg by mouth daily. prevagen   Restasis 0.05 % ophthalmic emulsion Generic drug: cycloSPORINE Place 1 drop into both eyes 2 (two) times daily.   rosuvastatin 10 MG tablet Commonly  known as: Crestor Take 1 tablet (10 mg total) by mouth at bedtime.   temazepam 30 MG capsule Commonly known as: RESTORIL TAKE ONE CAPSULE BY MOUTH AT BEDTIME AS NEEDED FOR SLEEP   triamterene-hydrochlorothiazide 37.5-25 MG tablet Commonly known as: MAXZIDE-25 Take 0.5 tablets by mouth daily.   Xarelto 2.5 MG Tabs tablet Generic drug: rivaroxaban TAKE TWO TABLETS BY MOUTH DAILY        Allergies:  Allergies  Allergen Reactions   Morphine And Codeine Anaphylaxis and Shortness Of Breath   Sulfonamide Derivatives Hives    Burn from inside out   Promethazine Hcl Other (See Comments)    Hallucinations    Past Medical History, Surgical history, Social history, and Family History were reviewed and updated.  Review of Systems: Review of Systems   Constitutional: Negative.   HENT: Negative.    Eyes: Negative.   Respiratory: Negative.    Cardiovascular: Negative.   Gastrointestinal: Negative.   Genitourinary: Negative.   Musculoskeletal:  Positive for joint pain.  Skin: Negative.   Neurological: Negative.   Endo/Heme/Allergies: Negative.   Psychiatric/Behavioral: Negative.       Physical Exam:  height is 5\' 8"  (1.727 m) and weight is 259 lb 1.9 oz (117.5 kg). Her oral temperature is 98 F (36.7 C). Her blood pressure is 129/78 and her pulse is 77. Her respiration is 18 and oxygen saturation is 98%.   Wt Readings from Last 3 Encounters:  03/16/23 259 lb 1.9 oz (117.5 kg)  03/12/23 250 lb (113.4 kg)  02/22/23 257 lb (116.6 kg)    Physical Exam Vitals reviewed.  HENT:     Head: Normocephalic and atraumatic.  Eyes:     Pupils: Pupils are equal, round, and reactive to light.     Comments: She has little bit of subconjunctival bleeding.  This is bilaterally.  This appears more so on the right eye than left eye.  Pupils react appropriately.  Cardiovascular:     Rate and Rhythm: Normal rate and regular rhythm.     Heart sounds: Normal heart sounds.  Pulmonary:     Effort: Pulmonary effort is normal.     Breath sounds: Normal breath sounds.  Abdominal:     General: Bowel sounds are normal.     Palpations: Abdomen is soft.  Musculoskeletal:        General: No tenderness or deformity. Normal range of motion.     Cervical back: Normal range of motion.  Lymphadenopathy:     Cervical: No cervical adenopathy.  Skin:    General: Skin is warm and dry.     Findings: No erythema or rash.  Neurological:     Mental Status: She is alert and oriented to person, place, and time.  Psychiatric:        Behavior: Behavior normal.        Thought Content: Thought content normal.        Judgment: Judgment normal.      Lab Results  Component Value Date   WBC 6.1 03/16/2023   HGB 12.7 03/16/2023   HCT 39.5 03/16/2023   MCV 88.8  03/16/2023   PLT 228 03/16/2023   Lab Results  Component Value Date   FERRITIN 51 12/05/2021   IRON 59 12/05/2021   TIBC 323 12/05/2021   UIBC 264 12/05/2021   IRONPCTSAT 18 12/05/2021   Lab Results  Component Value Date   RETICCTPCT 1.5 12/05/2021   RBC 4.45 03/16/2023   No results found for: "KPAFRELGTCHN", "LAMBDASER", "  KAPLAMBRATIO" No results found for: "IGGSERUM", "IGA", "IGMSERUM" No results found for: "TOTALPROTELP", "ALBUMINELP", "A1GS", "A2GS", "BETS", "BETA2SER", "GAMS", "MSPIKE", "SPEI"   Chemistry      Component Value Date/Time   NA 144 03/16/2023 1454   NA 143 12/12/2022 1100   NA 143 02/16/2017 1450   NA 142 01/19/2016 1141   K 4.1 03/16/2023 1454   K 3.7 02/16/2017 1450   K 3.7 01/19/2016 1141   CL 105 03/16/2023 1454   CL 105 02/16/2017 1450   CO2 32 03/16/2023 1454   CO2 32 02/16/2017 1450   CO2 27 01/19/2016 1141   BUN 20 03/16/2023 1454   BUN 17 12/12/2022 1100   BUN 14 02/16/2017 1450   BUN 19.5 01/19/2016 1141   CREATININE 1.06 (H) 03/16/2023 1454   CREATININE 1.2 02/16/2017 1450   CREATININE 0.9 01/19/2016 1141      Component Value Date/Time   CALCIUM 9.9 03/16/2023 1454   CALCIUM 8.8 02/16/2017 1450   CALCIUM 8.8 01/19/2016 1141   ALKPHOS 53 03/16/2023 1454   ALKPHOS 57 02/16/2017 1450   ALKPHOS 59 01/19/2016 1141   AST 18 03/16/2023 1454   AST 21 01/19/2016 1141   ALT 16 03/16/2023 1454   ALT 27 02/16/2017 1450   ALT 18 01/19/2016 1141   BILITOT 0.4 03/16/2023 1454   BILITOT 0.31 01/19/2016 1141       Impression and Plan: Ms. Dow is a very pleasant 72 yo African American female with history of recurrent PE.   Her initial thrombus was diagnosed in 2012 and then recurred in 2015.   She is on lifelong anticoagulation with Xarelto 5 mg PO daily and tolerating well.   Again, she has these small subconjunctival hemorrhages.  Is hard to say if these are from the Xarelto.  From my point of view, I do not have a problem with her  stopping the Xarelto for a few days.  Probably what I would do is I would stop the Xarelto for a week.  I would then get her her back on the 2.5 mg Xarelto dose for 2 weeks and then get back on the 5 mg dose.  Hopefully, she will be doing a bit better with this try to her swallowing.  I would like to get her back so we can see her in about 3 months now.   Josph Macho, MD 11/22/20243:46 PM

## 2023-03-19 ENCOUNTER — Encounter (INDEPENDENT_AMBULATORY_CARE_PROVIDER_SITE_OTHER): Payer: Self-pay | Admitting: Internal Medicine

## 2023-03-19 ENCOUNTER — Ambulatory Visit (INDEPENDENT_AMBULATORY_CARE_PROVIDER_SITE_OTHER): Payer: Medicare Other | Admitting: Internal Medicine

## 2023-03-19 VITALS — BP 119/82 | HR 82 | Temp 98.0°F | Ht 68.0 in | Wt 258.0 lb

## 2023-03-19 DIAGNOSIS — E66812 Obesity, class 2: Secondary | ICD-10-CM | POA: Diagnosis not present

## 2023-03-19 DIAGNOSIS — R1310 Dysphagia, unspecified: Secondary | ICD-10-CM

## 2023-03-19 DIAGNOSIS — E1169 Type 2 diabetes mellitus with other specified complication: Secondary | ICD-10-CM | POA: Diagnosis not present

## 2023-03-19 DIAGNOSIS — I10 Essential (primary) hypertension: Secondary | ICD-10-CM | POA: Diagnosis not present

## 2023-03-19 DIAGNOSIS — Z6838 Body mass index (BMI) 38.0-38.9, adult: Secondary | ICD-10-CM | POA: Diagnosis not present

## 2023-03-19 DIAGNOSIS — G4733 Obstructive sleep apnea (adult) (pediatric): Secondary | ICD-10-CM

## 2023-03-19 DIAGNOSIS — Z7985 Long-term (current) use of injectable non-insulin antidiabetic drugs: Secondary | ICD-10-CM

## 2023-03-19 MED ORDER — TIRZEPATIDE 2.5 MG/0.5ML ~~LOC~~ SOAJ: 2.5000 mg | SUBCUTANEOUS | 0 refills | Status: DC

## 2023-03-19 NOTE — Progress Notes (Unsigned)
Office: [phone]  /  Fax: [phone]  Weight Summary And Biometrics  Vitals Temp: 98 F (36.7 C) BP: 119/82 Pulse Rate: 82 SpO2: 98 %   Anthropometric Measurements Height: 5\' 8"  (1.727 m) Weight: 258 lb (117 kg) BMI (Calculated): 39.24 Weight at Last Visit: 257 lb Weight Lost Since Last Visit: 0 lb Weight Gained Since Last Visit: 1 lb Starting Weight: 256 lb Total Weight Loss (lbs): 0 lb (0 kg) Peak Weight: 259 lb   Body Composition  Body Fat %: 47.8 % Fat Mass (lbs): 123.8 lbs Muscle Mass (lbs): 128.2 lbs Total Body Water (lbs): 86.2 lbs Visceral Fat Rating : 16    RMR: [id number]  Today's Visit #: 5  Starting Date: 12/12/22   Subjective   Chief Complaint: Obesity  Queen is here to discuss her progress with her obesity treatment plan. She is on the the Category 2 Plan and states she is following her eating plan approximately 10 % of the time. She states she is not exercising.  Interval History:   Discussed the use of AI scribe software for clinical note transcription with the patient, who gave verbal consent to proceed.  History of Present Illness   The patient, with a history of esophageal stricture and difficulty swallowing, presents for a follow-up on weight management. She reports a recent episode of conjunctival hemorrhage in the eyes, which was managed by her doctor who adjusted her blood thinner medication. The patient also underwent esophageal dilation, which was followed by a nosebleed and redness in the eyes.  She is followed by surgery as well as gastroenterology.  The patient has a history of diet-controlled diabetes, with the most recent HbA1c at 6.3. She expresses reluctance to start diabetes medication, but acknowledges the potential progression of the disease if left untreated.  The patient's diet consists of soft consistencies due to her esophageal stricture. She reports consuming a breakfast protein shake and soft cereal, and occasionally  pickled beets. She has previously tracked her calorie intake and exercise using a fitness app.  The patient also reports a history of knee surgery and currently uses a walking pad for exercise. She expresses interest in starting water aerobics to increase her physical activity. The patient uses a CPAP machine nightly for sleep apnea.  The patient's weight management has been challenging due to her medical conditions, slowed metabolism, and dietary restrictions.      She has been referred to a dietitian and appointment is scheduled in the next 1 to 2 months.  Orexigenic Control:  Reports problems with appetite and hunger signals.  Reports problems with satiety and satiation.  Denies problems with eating patterns and portion control.  Reports abnormal cravings. Denies feeling deprived or restricted.   Barriers identified: strong hunger signals and impaired satiety / inhibitory control, limited food variation or intolerances, low volume of physical activity at present , and medical comorbidities.   Pharmacotherapy for weight loss: She is currently taking no anti-obesity medication.   Assessment and Plan   Treatment Plan For Obesity:  Recommended Dietary Goals  Nevayah is currently in the action stage of change. As such, her goal is to continue weight management plan. She has agreed to: continue current plan.  Patient also agreed to tracking and journaling calories.  Behavioral Intervention  We discussed the following Behavioral Modification Strategies today: continue to work on maintaining a reduced calorie state, getting the recommended amount of protein, incorporating whole foods, making healthy choices, staying well hydrated and practicing mindfulness when  eating..  Additional resources provided today: Handout and personalized instruction on tracking and journaling  Recommended Physical Activity Goals  Michalina has been advised to work up to 150 minutes of moderate intensity aerobic  activity a week and strengthening exercises 2-3 times per week for cardiovascular health, weight loss maintenance and preservation of muscle mass.   She has agreed to :  Think about enjoyable ways to increase daily physical activity and overcoming barriers to exercise and Increase physical activity in their day and reduce sedentary time (increase NEAT).  Pharmacotherapy  We discussed various medication options to help Moriyah with her weight loss efforts and we both agreed to : start anti-obesity medication.  In addition to reduced calorie nutrition plan (RCNP), behavioral strategies and physical activity, Jaydalis would benefit from pharmacotherapy to assist with hunger signals, satiety and cravings. This will reduce obesity-related health risks by inducing weight loss, and help reduce food consumption and adherence to [facility]) . It may also improve QOL by improving self-confidence and reduce the  setbacks associated with metabolic adaptations.  After discussion of treatment options, mechanisms of action, benefits, side effects, contraindications and shared decision making she is agreeable to starting Mounjaro 2.5 mg once a week with the primary indication of type 2 diabetes. Patient also made aware that medication is indicated for long-term management of obesity and the risk of weight regain following discontinuation of treatment and hence the importance of adhering to medical weight loss plan.  We demonstrated use of device and patient using teach back method was able to demonstrate proper technique.  Associated Conditions Addressed Today  Type 2 diabetes mellitus with other specified complication, without long-term current use of insulin (HCC) Assessment & Plan: Her diabetes is diet-controlled with an A1c of 6.3. We discussed the importance of preventing progression through weight management and balanced nutrition, noting that GLP-1 agonists can aid in weight loss and reduce the risk of diabetes  progression. We will monitor blood glucose levels, continue diet control and monitor A1c, and consider GLP-1 agonist therapy for weight loss and diabetes prevention.  Orders: -     Tirzepatide; Inject 2.5 mg into the skin once a week.  Dispense: 2 mL; Refill: 0  Class 2 severe obesity with serious comorbidity and body mass index (BMI) of 38.0 to 38.9 in adult, unspecified obesity type [facility]) Assessment & Plan: Patient is inquiring about GLP-1 therapy.  Her food recall suggest a high percentage of carbs and smaller proportion of protein, fruits and vegetables.  She continues to work on increasing her protein intake.  We also reviewed the importance of increasing physical activity.  This will definitely affect her weight loss rate as she also has diabetes.     Because of nutritional intolerances she will be referred to a registered dietitian for more tailored approach.  Patient will be started on Mounjaro 2.5 mg once a week for diabetes management and assistance with weight management.    Orders: -     Tirzepatide; Inject 2.5 mg into the skin once a week.  Dispense: 2 mL; Refill: 0  OSA on CPAP Assessment & Plan: On CPAP with reported good compliance. Continue PAP therapy. Losing 15% or more of body weight may improve AHI.  Patient will be started on GLP-1 therapy for diabetes management as well as assist with her obesity and associated comorbid conditions.   Orders: -     Tirzepatide; Inject 2.5 mg into the skin once a week.  Dispense: 2 mL; Refill: 0  Dysphagia,  unspecified type Assessment & Plan: She has an esophageal stricture requiring regular dilation and recently underwent dilation. She is currently on a soft diet and was advised to chew food thoroughly to a mashed potato consistency. We informed her that surgery is the last option and to report symptoms of food getting stuck. She will continue regular esophageal dilations as needed, maintain a soft diet and chew food thoroughly, and  monitor for symptoms of food getting stuck, reporting to a thoracic surgeon as necessary.  She has a nisin fundoplication.   Essential hypertension Assessment & Plan: Blood pressure close to goal.  She is currently on triamterene hydrochlorothiazide.  Her most recent GFR was 57 she had normal electrolytes.  Patient counseled on maintaining adequate hydration as she is on diuretic and also avoidance of nephrotoxins.  Recommend repeating a CMP in 4 weeks to make sure she does not have chronic kidney disease.      General Health Maintenance   We discussed the importance of regular screenings and health maintenance for diabetes management, including annual eye exams, urine protein checks, and foot exams. She will ensure annual eye exams, check urine for protein, and perform regular foot exams.  Follow-up   We scheduled a follow-up appointment in three weeks.        Objective   Physical Exam:  Blood pressure 119/82, pulse 82, temperature 98 F (36.7 C), height 5\' 8"  (1.727 m), weight 258 lb (117 kg), SpO2 98%. Body mass index is 39.23 kg/m.  General: She is overweight, cooperative, alert, well developed, and in no acute distress. PSYCH: Has normal mood, affect and thought process.   HEENT: EOMI, sclerae are anicteric. Lungs: Normal breathing effort, no conversational dyspnea. Extremities: No edema.  Neurologic: No gross sensory or motor deficits. No tremors or fasciculations noted.    Diagnostic Data Reviewed:  BMET    Component Value Date/Time   NA 144 03/16/2023 1454   NA 143 12/12/2022 1100   NA 143 02/16/2017 1450   NA 142 01/19/2016 1141   K 4.1 03/16/2023 1454   K 3.7 02/16/2017 1450   K 3.7 01/19/2016 1141   CL 105 03/16/2023 1454   CL 105 02/16/2017 1450   CO2 32 03/16/2023 1454   CO2 32 02/16/2017 1450   CO2 27 01/19/2016 1141   GLUCOSE 120 (H) 03/16/2023 1454   GLUCOSE 88 02/16/2017 1450   BUN 20 03/16/2023 1454   BUN 17 12/12/2022 1100   BUN 14  02/16/2017 1450   BUN 19.5 01/19/2016 1141   CREATININE 1.06 (H) 03/16/2023 1454   CREATININE 1.2 02/16/2017 1450   CREATININE 0.9 01/19/2016 1141   CALCIUM 9.9 03/16/2023 1454   CALCIUM 8.8 02/16/2017 1450   CALCIUM 8.8 01/19/2016 1141   GFRNONAA 57 (L) 03/16/2023 1454   GFRAA >60 12/25/2019 1353   Lab Results  Component Value Date   HGBA1C 6.3 (H) 12/12/2022   HGBA1C 6.2 (H) 10/14/2007   Lab Results  Component Value Date   INSULIN 20.4 12/12/2022   INSULIN 12.6 05/19/2021   Lab Results  Component Value Date   TSH 4.110 12/12/2022   CBC    Component Value Date/Time   WBC 6.1 03/16/2023 1454   WBC 4.5 03/12/2023 0721   RBC 4.45 03/16/2023 1454   HGB 12.7 03/16/2023 1454   HGB 12.1 02/16/2017 1450   HCT 39.5 03/16/2023 1454   HCT 36.6 02/16/2017 1450   PLT 228 03/16/2023 1454   PLT 239 02/16/2017 1450   MCV  88.8 03/16/2023 1454   MCV 87 02/16/2017 1450   MCH 28.5 03/16/2023 1454   MCHC 32.2 03/16/2023 1454   RDW 14.9 03/16/2023 1454   RDW 15.4 02/16/2017 1450   Iron Studies    Component Value Date/Time   IRON 59 12/05/2021 1451   IRON 49 02/16/2017 1536   TIBC 323 12/05/2021 1451   TIBC 284 02/16/2017 1536   FERRITIN 51 12/05/2021 1452   FERRITIN 66 02/16/2017 1536   IRONPCTSAT 18 12/05/2021 1451   IRONPCTSAT 17 (L) 02/16/2017 1536   Lipid Panel     Component Value Date/Time   CHOL 133 06/05/2022 1320   TRIG 49.0 06/05/2022 1320   HDL 71.80 06/05/2022 1320   CHOLHDL 2 06/05/2022 1320   VLDL 9.8 06/05/2022 1320   LDLCALC 51 06/05/2022 1320   Hepatic Function Panel     Component Value Date/Time   PROT 7.5 03/16/2023 1454   PROT 7.1 12/12/2022 1100   PROT 7.3 02/16/2017 1450   PROT 7.3 01/19/2016 1141   ALBUMIN 4.1 03/16/2023 1454   ALBUMIN 4.3 12/12/2022 1100   ALBUMIN 3.6 01/19/2016 1141   AST 18 03/16/2023 1454   AST 21 01/19/2016 1141   ALT 16 03/16/2023 1454   ALT 27 02/16/2017 1450   ALT 18 01/19/2016 1141   ALKPHOS 53 03/16/2023 1454    ALKPHOS 57 02/16/2017 1450   ALKPHOS 59 01/19/2016 1141   BILITOT 0.4 03/16/2023 1454   BILITOT 0.31 01/19/2016 1141   BILIDIR 0.0 01/08/2012 1224   IBILI 0.2 12/30/2010 1515      Component Value Date/Time   TSH 4.110 12/12/2022 1100   Nutritional Lab Results  Component Value Date   VD25OH 61.5 12/12/2022   VD25OH 42.2 05/19/2021    Follow-Up   Return in about 3 weeks (around 04/09/2023) for For Weight Mangement with Dr. Rikki Spearing.Marland Kitchen She was informed of the importance of frequent follow up visits to maximize her success with intensive lifestyle modifications for her multiple health conditions.  Attestation Statement   Reviewed by clinician on day of visit: allergies, medications, problem list, medical history, surgical history, family history, social history, and previous encounter notes.   I have spent 40 minutes in the care of the patient today including: preparing to see patient (e.g. review and interpretation of tests, old notes ), obtaining and/or reviewing separately obtained history, performing a medically appropriate examination or evaluation, counseling and educating the patient, ordering medications, test or procedures, documenting clinical information in the electronic or other health care record, and independently interpreting results and communicating results to the patient, family, or caregiver   Worthy Rancher, MD

## 2023-03-20 NOTE — Assessment & Plan Note (Signed)
Her diabetes is diet-controlled with an A1c of 6.3. We discussed the importance of preventing progression through weight management and balanced nutrition, noting that GLP-1 agonists can aid in weight loss and reduce the risk of diabetes progression. We will monitor blood glucose levels, continue diet control and monitor A1c, and consider GLP-1 agonist therapy for weight loss and diabetes prevention.

## 2023-03-20 NOTE — Assessment & Plan Note (Signed)
On CPAP with reported good compliance. Continue PAP therapy. Losing 15% or more of body weight may improve AHI.  Patient will be started on GLP-1 therapy for diabetes management as well as assist with her obesity and associated comorbid conditions.

## 2023-03-20 NOTE — Assessment & Plan Note (Signed)
She has an esophageal stricture requiring regular dilation and recently underwent dilation. She is currently on a soft diet and was advised to chew food thoroughly to a mashed potato consistency. We informed her that surgery is the last option and to report symptoms of food getting stuck. She will continue regular esophageal dilations as needed, maintain a soft diet and chew food thoroughly, and monitor for symptoms of food getting stuck, reporting to a thoracic surgeon as necessary.  She has a nisin fundoplication.

## 2023-03-20 NOTE — Assessment & Plan Note (Signed)
Blood pressure close to goal.  She is currently on triamterene hydrochlorothiazide.  Her most recent GFR was 57 she had normal electrolytes.  Patient counseled on maintaining adequate hydration as she is on diuretic and also avoidance of nephrotoxins.  Recommend repeating a CMP in 4 weeks to make sure she does not have chronic kidney disease.

## 2023-03-20 NOTE — Assessment & Plan Note (Signed)
Patient is inquiring about GLP-1 therapy.  Her food recall suggest a high percentage of carbs and smaller proportion of protein, fruits and vegetables.  She continues to work on increasing her protein intake.  We also reviewed the importance of increasing physical activity.  This will definitely affect her weight loss rate as she also has diabetes.     Because of nutritional intolerances she will be referred to a registered dietitian for more tailored approach.  Patient will be started on Mounjaro 2.5 mg once a week for diabetes management and assistance with weight management.

## 2023-03-30 ENCOUNTER — Encounter: Payer: Self-pay | Admitting: Dietician

## 2023-03-30 ENCOUNTER — Encounter: Payer: Medicare Other | Attending: Internal Medicine | Admitting: Dietician

## 2023-03-30 VITALS — Ht 68.5 in | Wt 261.0 lb

## 2023-03-30 DIAGNOSIS — E1169 Type 2 diabetes mellitus with other specified complication: Secondary | ICD-10-CM | POA: Diagnosis not present

## 2023-03-30 DIAGNOSIS — R1310 Dysphagia, unspecified: Secondary | ICD-10-CM | POA: Diagnosis not present

## 2023-03-30 NOTE — Progress Notes (Signed)
Medical Nutrition Therapy  Appointment Start time:  1017  Appointment End time:  1135  Primary concerns today: Patient has had dysphagia in the past.  This was dilated the week before Thanksgiving and had been having this done weekly for about 3 weeks and now prn.  History of Nissan Fundoplication 2004 and the wrap is too tight per patient.  She has a mild sore throat from multiple procedures. Drinks hot water prior to eating to relax her esophagus.  She goes to [facility].  She states that she was referred as she cannot tolerate all of the foods that are recommended on their plan. She is tracking her intake and states that she does not eat much.  We looked at her ability She states that she now should eat 1200 calories per day. Yesterday she ate and estimated 1400 calories and 95 grams protein based on corrections. Chews her food until it is the consistency of mashed potatoes. She states that she wishes to exercise again as she lost 10 lbs when she went on vacation and had increased walking.  Referral diagnosis: dysphagia, type 2 diabetes, obesity, OSA on C-pap Preferred learning style: no preference indicated Learning readiness: ready, change in progress   NUTRITION ASSESSMENT  Weight hx: 68.5" 261 lbs 03/30/2023 260 lbs  250 lbs 2014 229 lbs with knee surgery 210 lbs 2000 when she married  Clinical Medical Hx: Type 2 diabetes (about 2014), Nissan Fundoplication, GERD, HTN, thyroid disease, anemia, vitamin D deficiency Medications: Mounjaro, Xarelto, Crestor Labs: A1C 9.3% 12/12/2022, eGFR 57 03/16/2023 Notable Signs/Symptoms: dysphagia  Lifestyle & Dietary Hx Patient lives with her husband.  She does the shopping and cooking.  She is retired from the [employer]. Hurt knee, required a knee replacement and has not gone to the gym since.  She does use a peddler and she has not gone back to the gym.  Has a close friend that has a gym and is a  Psychologist, educational in [employer].  Supplements: gummy vitamins Sleep:  Stress / self-care:  Current average weekly physical activity: ADL's  24-Hr Dietary Recall First Meal: fairlife protein shake Snack: none Second Meal: Sante fe chicken salad, egg, saltine crackers Snack: Chobani yogurt Third Meal: 4 oz pork tenderloin, brown rice (1/4 cup), mixed vegetables Snack: Activia yogurt, pickled beets Beverages: water  Estimated Energy Needs Calories: 1200 Carbohydrate: 120g Protein: 90g Fat: 40g   NUTRITION DIAGNOSIS  NB-1.1 Food and nutrition-related knowledge deficit As related to balance of carbohydrates, protein, and fat as well as foods for dysphagia.  As evidenced by diet hx and patient report.   NUTRITION INTERVENTION  Nutrition education (E-1) on the following topics:  How to enter food portion sizes into her tracking app Eating window Macronutrient distribution Sources of protein and choices best for dysphagia Diet, dysphagia symptoms and obtaining adequate nutrition when it is causing most problems. Medication review Eating window to avoid late eating before bed Mindfulness, importance of habit change Exercise benefits and goal setting  Handouts Provided Include    Learning Style & Readiness for Change Teaching method utilized: Visual & Auditory  Demonstrated degree of understanding via: Teach Back  Barriers to learning/adherence to lifestyle change: none  Goals Established by Pt Patient wishes to speak with her pharmacist regarding medications that can be crushed Patient wishes to speak to her friend Madison Hickman to help with consistency of exercise.   Consistently track her intake  Aim for 20-30 grams of protein  with each meal  Very soft meats (moist cooking methods may be best)  Cottage cheese  Yogurt (particularly greek)  PB2 (peanut butter powder - unsweetened)  Soft scrambled eggs  Protein shakes  When you set up your app in the premium plan:  30% protein, 30%  fat, 40% carbs  Exercise:  Aim to be more active [name]  with [employer] - patient goal  Walk or other aerobic exercise 30 minutes (start slow and increase as tolerated) Bands or weights 2 times per week   Avoid gummy vitamins Ask your pharmacist about a chewable vitamin that is not a gummy Ask your pharmacist about your medications and if they should be crushed.  When you are unable to swallow, you will need to rely on liquids during those times and need to be mindful to get your vitamins, minerals and adequate protein.  Consider Orgain plant based protein shakes (mix with 1/2 banana and fairlife milk or plant based milk of choice) Avoid bread  Consider your eating window to avoid eating too late.  MONITORING & EVALUATION Dietary intake, weekly physical activity, and label reading in 1 month.  Next Steps  Patient is to call for questions.

## 2023-04-02 DIAGNOSIS — H15013 Anterior scleritis, bilateral: Secondary | ICD-10-CM | POA: Diagnosis not present

## 2023-04-09 ENCOUNTER — Ambulatory Visit (INDEPENDENT_AMBULATORY_CARE_PROVIDER_SITE_OTHER): Payer: Medicare Other | Admitting: Family Medicine

## 2023-04-09 ENCOUNTER — Encounter: Payer: Self-pay | Admitting: Family Medicine

## 2023-04-09 ENCOUNTER — Encounter: Payer: Self-pay | Admitting: Family

## 2023-04-09 VITALS — BP 118/71 | HR 82 | Temp 98.3°F | Ht 68.5 in | Wt 260.0 lb

## 2023-04-09 DIAGNOSIS — J069 Acute upper respiratory infection, unspecified: Secondary | ICD-10-CM | POA: Diagnosis not present

## 2023-04-09 DIAGNOSIS — R051 Acute cough: Secondary | ICD-10-CM

## 2023-04-09 DIAGNOSIS — R52 Pain, unspecified: Secondary | ICD-10-CM | POA: Diagnosis not present

## 2023-04-09 LAB — POCT INFLUENZA A/B
Influenza A, POC: NEGATIVE
Influenza B, POC: NEGATIVE

## 2023-04-09 LAB — POC COVID19 BINAXNOW: SARS Coronavirus 2 Ag: NEGATIVE

## 2023-04-09 MED ORDER — FLUTICASONE PROPIONATE 50 MCG/ACT NA SUSP: 2.0000 | Freq: Every day | NASAL | 6 refills | Status: DC

## 2023-04-09 MED ORDER — GUAIFENESIN ER 600 MG PO TB12: 1200.0000 mg | ORAL_TABLET | Freq: Two times a day (BID) | ORAL | 0 refills | Status: DC

## 2023-04-09 MED ORDER — BENZONATATE 200 MG PO CAPS: 200.0000 mg | ORAL_CAPSULE | Freq: Two times a day (BID) | ORAL | 0 refills | Status: DC | PRN

## 2023-04-09 NOTE — Progress Notes (Signed)
Acute Office Visit  Subjective:     Patient ID: Jennifer [name], female    DOB: 01-26-1951, 72 y.o.   MRN: [id number]  Chief Complaint  Patient presents with   Cough     Patient is in today for cough, URI.   Discussed the use of AI scribe software for clinical note transcription with the patient, who gave verbal consent to proceed.  History of Present Illness   The patient, with a history of using a CPAP machine, presents with a four-day history of feeling unwell, with the worst symptoms occurring two days prior to the consultation. The onset of symptoms was marked by a general feeling of malaise and excessive night sweats. The patient reported feeling significantly fatigued, with some improvement noted on the day of the consultation.  The patient has been experiencing a persistent cough, which has led to throat irritation. Accompanying the cough is a headache, managed with Tylenol. The patient also reported a loss of appetite. There was no report of chest pain or hemoptysis. The patient did note a unilateral runny nose that switched sides over the course of the illness.  The patient's headache was described as generalized, with no facial pain. There was no report of ear pain or dental pain. The cough was occasionally productive. The patient reported that deep breaths were painful, described as a generalized soreness rather than localized chest pain.  The patient denied taking any medication other than Tylenol for symptom management. Sleep was reported as satisfactory, although the patient did note waking up in the middle of the night due to the cough. The patient attended a party with several children present a few days before the onset of symptoms.        All review of systems negative except what is listed in the HPI      Objective:    BP 118/71   Pulse 82   Temp 98.3 F (36.8 C) (Oral)   Ht 5' 8.5" (1.74 m)   Wt 260 lb (117.9 kg)   SpO2 99%   BMI 38.96 kg/m     Physical Exam Vitals reviewed.  Constitutional:      General: She is not in acute distress.    Appearance: Normal appearance. She is obese. She is not ill-appearing.  HENT:     Right Ear: Tympanic membrane normal.     Left Ear: Tympanic membrane normal.  Cardiovascular:     Rate and Rhythm: Normal rate and regular rhythm.  Pulmonary:     Effort: Pulmonary effort is normal.     Breath sounds: Normal breath sounds. No wheezing, rhonchi or rales.  Neurological:     Mental Status: She is alert.      Results for orders placed or performed in visit on 04/09/23  POC COVID-19 BinaxNow  Result Value Ref Range   SARS Coronavirus 2 Ag Negative Negative  POCT Influenza A/B  Result Value Ref Range   Influenza A, POC Negative Negative   Influenza B, POC Negative Negative        Assessment & Plan:   Problem List Items Addressed This Visit   None Visit Diagnoses       Viral URI with cough    -  Primary   Relevant Medications   benzonatate (TESSALON) 200 MG capsule   fluticasone (FLONASE) 50 MCG/ACT nasal spray   guaiFENesin (MUCINEX) 600 MG 12 hr tablet     Acute cough       Relevant Orders  POC COVID-19 BinaxNow (Completed)   POCT Influenza A/B (Completed)     Body aches       Relevant Orders   POC COVID-19 BinaxNow (Completed)   POCT Influenza A/B (Completed)      Upper Respiratory Infection Symptoms include cough, headache, fatigue, and nasal congestion. No chest pain or hemoptysis. Throat irritation due to cough. Nasal congestion with unilateral shifting. No otalgia, but left ear is erythematous. Throat shows drainage and nasal passages are swollen. -Tests for influenza and COVID-19 are negative today -Provide cough medicine and nasal spray for symptomatic relief. -If symptoms persist after one week, consider secondary bacterial infection and initiate antibiotics. -Continue supportive measures including rest, hydration, humidifier use, steam showers, warm  compresses to sinuses, warm liquids with lemon and honey, and over-the-counter cough, cold, and analgesics as needed.         Meds ordered this encounter  Medications   benzonatate (TESSALON) 200 MG capsule    Sig: Take 1 capsule (200 mg total) by mouth 2 (two) times daily as needed for cough.    Dispense:  20 capsule    Refill:  0    Supervising Provider:   Danise Edge A [[id number]]   fluticasone (FLONASE) 50 MCG/ACT nasal spray    Sig: Place 2 sprays into both nostrils daily.    Dispense:  16 g    Refill:  6    Supervising Provider:   Danise Edge A [[id number]]   guaiFENesin (MUCINEX) 600 MG 12 hr tablet    Sig: Take 2 tablets (1,200 mg total) by mouth 2 (two) times daily.    Dispense:  30 tablet    Refill:  0    Supervising Provider:   Danise Edge A [[id number]]    Return if symptoms worsen or fail to improve.  Clayborne Dana, NP

## 2023-04-09 NOTE — Patient Instructions (Signed)
 Likely Viral Upper Respiratory Infection  Continue supportive measures including rest, hydration, humidifier use, steam showers, warm compresses to sinuses, warm liquids with lemon and honey, and over-the-counter cough, cold, and analgesics as needed. If symptoms persist 8-10 days, become severe, or return after a few days of feeling better, then please follow-up for repeat evaluation to determine if antibiotics may be necessary.  Over the counter medications that may be helpful for symptoms:  Guaifenesin 1200 mg extended release tabs twice daily, with plenty of water For cough and congestion Brand name: Mucinex   Pseudoephedrine 30 mg, one or two tabs every 4 to 6 hours For sinus congestion Brand name: Sudafed You must get this from the pharmacy counter.  Oxymetazoline nasal spray each morning, one spray in each nostril, for NO MORE THAN 3 days  For nasal and sinus congestion Brand name: Afrin Saline nasal spray or Saline Nasal Irrigation (Netti Pot, etc) 3-5 times a day For nasal and sinus congestion Brand names: Ocean or AYR Fluticasone nasal spray OR Mometasone nasal spray OR Triamcinolone Acetonide nasal spray - follow directions on the packaging For nasal and sinus congestion Brand name: Flonase, Nasonex, Nasacort Warm salt water gargles  For sore throat Every few hours as needed Alternate ibuprofen 400-600 mg and acetaminophen 1000 mg every 6 hours For fever, body aches, headache Brand names: Motrin or Advil and Tylenol Dextromethorphan 12-hour cough version 30 mg every 12 hours  For cough Brand name: Delsym Stop all other cold medications for now (Nyquil, Dayquil, Tylenol Cold, Theraflu, etc) and other non-prescription cough/cold preparations. Many of these have the same ingredients listed above and could cause an overdose of medication.   Herbal treatments that have been shown to be helpful in some patients include: Vitamin C 1000 mg per day Zinc 100 mg per day Quercetin  25-500 mg twice a day Melatonin 5-10mg  at bedtime Honey Green Tea  General Instructions Allow your body to rest Drink PLENTY of fluids Typically, we are the most contagious 1-2 days before symptoms start through the first 2-3 days of most severe symptoms. Per CDC guidelines, you can return to school/work when symptoms have started to improve and you have been fever-free for 24 hours. However, recommend you continue extra precautions for the following 5 days (frequent hand hygiene, masking, covering coughs/sneezes, minimize exposure to immunocompromised individuals, etc).  If you develop severe shortness of breath, uncontrolled fevers, coughing up blood, confusion, chest pain, or signs of dehydration (such as significantly decreased urine amounts or dizziness with standing) please go to the nearest ER.

## 2023-04-12 ENCOUNTER — Telehealth: Payer: Self-pay

## 2023-04-12 DIAGNOSIS — J069 Acute upper respiratory infection, unspecified: Secondary | ICD-10-CM

## 2023-04-12 MED ORDER — AMOXICILLIN-POT CLAVULANATE 875-125 MG PO TABS: 1.0000 | ORAL_TABLET | Freq: Two times a day (BID) | ORAL | 0 refills | Status: AC

## 2023-04-12 NOTE — Telephone Encounter (Signed)
It's been about a week since symptoms started now. I'll send in Augmentin. If she can give it another day or two of supportive measures first that would be ideal in case she turns a corner on her own. If not or if symptoms are severe, go ahead and start antibiotics. Follow-up if not improving after a few days of starting ABX.

## 2023-04-12 NOTE — Telephone Encounter (Signed)
Patient seen 04/09/2023. Told to take tessalon, flonase, and mucinex.  From note:  Likely Viral Upper Respiratory Infection  Continue supportive measures including rest, hydration, humidifier use, steam showers, warm compresses to sinuses, warm liquids with lemon and honey, and over-the-counter cough, cold, and analgesics as needed. If symptoms persist 8-10 days, become severe, or return after a few days of feeling better, then please follow-up for repeat evaluation to determine if antibiotics may be necessary.     Please advise.

## 2023-04-12 NOTE — Telephone Encounter (Signed)
Patient made aware. She has not been taking Mucinex. Will start medication.

## 2023-04-12 NOTE — Telephone Encounter (Signed)
Copied from CRM [id number]. Topic: Clinical - Medical Advice >> Apr 12, 2023 11:55 AM Elizebeth Brooking wrote: Reason for CRM: Patient stated she need some other type of medicine, still not feeling good requesting a callback from nurse

## 2023-04-12 NOTE — Telephone Encounter (Signed)
Pt seen by Ladona Ridgel on 12/16.

## 2023-04-17 ENCOUNTER — Other Ambulatory Visit (INDEPENDENT_AMBULATORY_CARE_PROVIDER_SITE_OTHER): Payer: Self-pay | Admitting: Internal Medicine

## 2023-04-17 DIAGNOSIS — E66812 Obesity, class 2: Secondary | ICD-10-CM

## 2023-04-17 DIAGNOSIS — G4733 Obstructive sleep apnea (adult) (pediatric): Secondary | ICD-10-CM

## 2023-04-17 DIAGNOSIS — E1169 Type 2 diabetes mellitus with other specified complication: Secondary | ICD-10-CM

## 2023-04-19 ENCOUNTER — Telehealth: Payer: Self-pay | Admitting: Gastroenterology

## 2023-04-19 ENCOUNTER — Other Ambulatory Visit: Payer: Self-pay | Admitting: *Deleted

## 2023-04-19 ENCOUNTER — Ambulatory Visit: Payer: Medicare Other | Admitting: Gastroenterology

## 2023-04-19 ENCOUNTER — Telehealth (INDEPENDENT_AMBULATORY_CARE_PROVIDER_SITE_OTHER): Payer: Self-pay | Admitting: Internal Medicine

## 2023-04-19 DIAGNOSIS — E041 Nontoxic single thyroid nodule: Secondary | ICD-10-CM

## 2023-04-19 DIAGNOSIS — G4733 Obstructive sleep apnea (adult) (pediatric): Secondary | ICD-10-CM

## 2023-04-19 DIAGNOSIS — E1169 Type 2 diabetes mellitus with other specified complication: Secondary | ICD-10-CM

## 2023-04-19 DIAGNOSIS — E66812 Obesity, class 2: Secondary | ICD-10-CM

## 2023-04-19 NOTE — Telephone Encounter (Signed)
12/26 Pt called in stating that [facility] told her that her Mounjaro refill was denied by our office. Pt states she took her last dose on 04/17/23 and she does not have another appt until 04/30/23. Pt states she was told she cannot miss any doses and she would like to know why her refill was denied. Please follow up with patient, pt asks that you call her on her mobile number [phone].

## 2023-04-19 NOTE — Telephone Encounter (Signed)
Called patient, she is getting over a bad cold and forgot about the appointment.  She is doing better, is getting serial esophageal dilations by Dr. Cliffton Asters. She has not seen endocrinologist in the last 2 to 3 years, last thyroid nodule biopsy was in 2022.  Her previous endocrinologist has retired.  Will send referral to endocrinology for follow-up of thyroid nodules.  Follow-up in GI office in 6 months or sooner if needed.  Iona Beard , MD [phone]

## 2023-04-19 NOTE — Progress Notes (Signed)
Placed Endocrinology referral per Dr Lavon Paganini for thyroid nodule

## 2023-04-19 NOTE — Telephone Encounter (Signed)
Placed referral today to Larose Hires  [facility]. Also put in recall for a 6 month follow up office visit for her.

## 2023-04-23 NOTE — Telephone Encounter (Signed)
Pt called in stating that she needs a refill for [facility] and she needs it by tomorrow. Please follow up with patient.

## 2023-04-24 ENCOUNTER — Other Ambulatory Visit (INDEPENDENT_AMBULATORY_CARE_PROVIDER_SITE_OTHER): Payer: Self-pay | Admitting: Internal Medicine

## 2023-04-24 ENCOUNTER — Other Ambulatory Visit: Payer: Self-pay | Admitting: Family Medicine

## 2023-04-24 DIAGNOSIS — G4733 Obstructive sleep apnea (adult) (pediatric): Secondary | ICD-10-CM

## 2023-04-24 DIAGNOSIS — E66812 Obesity, class 2: Secondary | ICD-10-CM

## 2023-04-24 DIAGNOSIS — J069 Acute upper respiratory infection, unspecified: Secondary | ICD-10-CM

## 2023-04-24 DIAGNOSIS — E1169 Type 2 diabetes mellitus with other specified complication: Secondary | ICD-10-CM

## 2023-04-24 MED ORDER — TIRZEPATIDE 5 MG/0.5ML ~~LOC~~ SOAJ: 5.0000 mg | SUBCUTANEOUS | 0 refills | Status: DC

## 2023-04-24 NOTE — Telephone Encounter (Signed)
 Patient is currently on Mounjaro  2.5 mg once a week, she is tolerating this dosage very well with no side effects. She has been on this dose for a month but would like to titrate up to the next dosage. She is scheduled to be seen next month with Dr. Francyne. Please send the next strength to [facility]. Already pended and ready if you are agreeable.

## 2023-04-25 ENCOUNTER — Other Ambulatory Visit: Payer: Self-pay | Admitting: Hematology & Oncology

## 2023-04-25 ENCOUNTER — Telehealth: Payer: Self-pay | Admitting: Internal Medicine

## 2023-04-25 ENCOUNTER — Encounter: Payer: Self-pay | Admitting: Family

## 2023-04-26 ENCOUNTER — Encounter: Payer: Self-pay | Admitting: Family

## 2023-04-26 NOTE — Telephone Encounter (Signed)
 PDMP okay, Rx sent

## 2023-04-26 NOTE — Telephone Encounter (Signed)
 Requesting: temazepam 30mg   Contract: 06/05/22 UDS: 06/05/22 Last Visit: 02/05/23 Next Visit: 06/08/23 Last Refill: 11/16/22 #30 and 4RF  Please Advise

## 2023-04-30 ENCOUNTER — Encounter (INDEPENDENT_AMBULATORY_CARE_PROVIDER_SITE_OTHER): Payer: Self-pay | Admitting: Internal Medicine

## 2023-04-30 ENCOUNTER — Ambulatory Visit (INDEPENDENT_AMBULATORY_CARE_PROVIDER_SITE_OTHER): Payer: Medicare Other | Admitting: Internal Medicine

## 2023-04-30 VITALS — BP 135/80 | HR 83 | Temp 97.8°F | Ht 68.0 in | Wt 249.0 lb

## 2023-04-30 DIAGNOSIS — Z6838 Body mass index (BMI) 38.0-38.9, adult: Secondary | ICD-10-CM | POA: Diagnosis not present

## 2023-04-30 DIAGNOSIS — Z7985 Long-term (current) use of injectable non-insulin antidiabetic drugs: Secondary | ICD-10-CM

## 2023-04-30 DIAGNOSIS — E1169 Type 2 diabetes mellitus with other specified complication: Secondary | ICD-10-CM | POA: Diagnosis not present

## 2023-04-30 DIAGNOSIS — I10 Essential (primary) hypertension: Secondary | ICD-10-CM

## 2023-04-30 DIAGNOSIS — G4733 Obstructive sleep apnea (adult) (pediatric): Secondary | ICD-10-CM

## 2023-04-30 DIAGNOSIS — E66812 Obesity, class 2: Secondary | ICD-10-CM | POA: Diagnosis not present

## 2023-04-30 MED ORDER — TIRZEPATIDE 5 MG/0.5ML ~~LOC~~ SOAJ: 5.0000 mg | SUBCUTANEOUS | 0 refills | Status: DC

## 2023-04-30 NOTE — Assessment & Plan Note (Signed)
 On CPAP with reported good compliance. Continue PAP therapy. Losing 15% or more of body weight may improve AHI.  Patient will be started on GLP-1 therapy for diabetes management as well as assist with her obesity and associated comorbid conditions.

## 2023-04-30 NOTE — Assessment & Plan Note (Signed)
 Her diabetes is diet-controlled with an A1c of 6.3. We discussed the importance of preventing progression through weight management and balanced nutrition, noting that GLP-1 agonists can aid in weight loss and reduce the risk of diabetes progression. We will monitor blood glucose levels, continue diet control and monitor A1c, and consider GLP-1 agonist therapy for weight loss and diabetes prevention.

## 2023-04-30 NOTE — Progress Notes (Signed)
 Office: [phone]  /  Fax: [phone]  Weight Summary And Biometrics  Vitals Temp: 97.8 F (36.6 C) BP: 135/80 Pulse Rate: 83 SpO2: 99 %   Anthropometric Measurements Height: 5' 8 (1.727 m) Weight: 249 lb (112.9 kg) BMI (Calculated): 37.87 Weight at Last Visit: 258 lb Weight Lost Since Last Visit: 0 lb Weight Gained Since Last Visit: 1 lb Starting Weight: 256 lb Total Weight Loss (lbs): 7 lb (3.175 kg) Peak Weight: 259 lb   Body Composition  Body Fat %: 47.1 % Fat Mass (lbs): 117.4 lbs Muscle Mass (lbs): 125.2 lbs Total Body Water (lbs): 83.8 lbs Visceral Fat Rating : 15    RMR: [id number]  Today's Visit #: 6  Starting Date: 12/12/22   Subjective   Chief Complaint: Obesity  Oktober is here to discuss her progress with her obesity treatment plan. She is on the the Category 2 Plan and states she is following her eating plan approximately 80 % of the time. She states she is exercising 20 minutes 3 times per week.  Interval History:   Discussed the use of AI scribe software for clinical note transcription with the patient, who gave verbal consent to proceed.  History of Present Illness   The patient, with a history of diabetes, hypertension, and obesity, has been on a weight loss regimen and reports a significant weight loss. She has been adhering to a diet and exercise plan, and has been taking a new medication, which she tolerates well without significant side effects such as diarrhea. She has noticed a change in her clothing fit, suggesting a decrease in body size, but she has not noticed an increase in energy levels.  She has been doing a good job counselling psychologist and has been maintained under 1200 she is averaging around 70 to 80 g of protein per day.  The patient also reports a recent cold, contracted from her grandchild, which caused a temporary disruption in her use of CPAP for sleep apnea. During the illness, she experienced a severe cough which made  it uncomfortable to use the CPAP machine. She also had to stay up for two nights due to the severity of the cough when lying down. However, she has since recovered and resumed the use of her CPAP machine, despite previous concerns from her doctor about the machine's effectiveness.  However, she reports that she has not taken her blood pressure medication on the day of the consultation.  The patient has been monitoring her diet closely, tracking her protein and carbohydrate intake. She has been staying within her calorie limit most days, but there have been instances where she has consumed more carbohydrates than recommended. She has also been making an effort to stay well-hydrated, especially since she is on a diuretic medication.  Despite her efforts, the patient's blood pressure was found to be slightly elevated during the consultation.       Orexigenic Control:  Denies problems with appetite and hunger signals.  Denies problems with satiety and satiation.  Denies problems with eating patterns and portion control.  Denies abnormal cravings. Denies feeling deprived or restricted.   Barriers identified: low volume of physical activity at present .   Pharmacotherapy for weight loss: She is currently taking Monjauro with diabetes as the primary indication with adequate clinical response  and without side effects..   Assessment and Plan   Treatment Plan For Obesity:  Recommended Dietary Goals  Jackelynn is currently in the action stage of change. As such,  her goal is to continue weight management plan. She has agreed to: continue current plan  Behavioral Intervention  We discussed the following Behavioral Modification Strategies today: continue to work on maintaining a reduced calorie state, getting the recommended amount of protein, incorporating whole foods, making healthy choices, staying well hydrated and practicing mindfulness when eating..  Additional resources provided today:  None  Recommended Physical Activity Goals  Rori has been advised to work up to 150 minutes of moderate intensity aerobic activity a week and strengthening exercises 2-3 times per week for cardiovascular health, weight loss maintenance and preservation of muscle mass.   She has agreed to :  Think about enjoyable ways to increase daily physical activity and overcoming barriers to exercise and Increase physical activity in their day and reduce sedentary time (increase NEAT).  Pharmacotherapy  We discussed various medication options to help Avonlea with her weight loss efforts and we both agreed to : continue current anti-obesity medication regimen  Associated Conditions Addressed and Impacted by Obesity Treatment  Essential hypertension  OSA on CPAP Assessment & Plan: On CPAP with reported good compliance. Continue PAP therapy. Losing 15% or more of body weight may improve AHI.  Patient will be started on GLP-1 therapy for diabetes management as well as assist with her obesity and associated comorbid conditions.   Orders: -     Tirzepatide ; Inject 5 mg into the skin once a week.  Dispense: 2 mL; Refill: 0  Type 2 diabetes mellitus with other specified complication, without long-term current use of insulin  [facility]) Assessment & Plan: Her diabetes is diet-controlled with an A1c of 6.3. We discussed the importance of preventing progression through weight management and balanced nutrition, noting that GLP-1 agonists can aid in weight loss and reduce the risk of diabetes progression. We will monitor blood glucose levels, continue diet control and monitor A1c, and consider GLP-1 agonist therapy for weight loss and diabetes prevention.  Orders: -     Tirzepatide ; Inject 5 mg into the skin once a week.  Dispense: 2 mL; Refill: 0  Class 2 severe obesity with serious comorbidity and body mass index (BMI) of 38.0 to 38.9 in adult, unspecified obesity type (HCC) -     Tirzepatide ; Inject 5 mg into the  skin once a week.  Dispense: 2 mL; Refill: 0     Assessment and Plan    Obesity Significant weight loss noted. Advised to avoid skipping meals to ensure proper nourishment. Tracking meals and protein intake effectively. Discussed the importance of maintaining a balanced diet with adequate protein and calorie intake to avoid malnutrition. Current dose of Mounjaro  (5 mg) is effective. - Continue current dose of Mounjaro  (5 mg) - Ensure intake of at least 1200 calories per day - Consume at least 75-80 grams of protein daily - Stay well-hydrated with 90 ounces of water per day - Monitor weight and dietary intake regularly  Hypertension Blood pressure improved to 135/80 during the visit. Goal is to maintain blood pressure less than 130/80 due to diabetes. On triamterene  hydrochlorothiazide  37.5 mg but takes half a pill due to previous episodes of hypotension. Discussed the importance of daily medication adherence and home monitoring. - Continue triamterene  hydrochlorothiazide  37.5 mg, taking half a pill daily - Monitor blood pressure at home regularly - Follow up in three weeks to reassess blood pressure and medication efficacy  Diabetes Mellitus HgbA1c is at goal for age and comorbid conditions. Denies symptoms of hypoglycemia or hyperglycemia. On Mounjaro  5 mg with good  adherence and no side effects.   Counseled on goals of care, monitoring for complications and importance of staying updated on immunizations and diabetes preventive measures. Continue with reduced calorie meal plan low on processed crabs and simple sugars. Ongoing weight loss will improve insulin  resistance and glycemic control  Lab Results  Component Value Date   HGBA1C 6.3 (H) 12/12/2022   HGBA1C 6.3 06/05/2022   HGBA1C 6.2 10/17/2021   Lab Results  Component Value Date   MICROALBUR 0.8 06/05/2022   LDLCALC 51 06/05/2022   CREATININE 1.06 (H) 03/16/2023      Hyperlipidemia On rosuvastatin  for cholesterol  management. Addressed concerns about dementia related to statin use and clarified that current evidence does not support this association. Emphasized the importance of continuing statin therapy to reduce cardiovascular risk. - Continue rosuvastatin  as prescribed - Monitor cholesterol levels regularly  Obstructive Sleep Apnea Uses CPAP every night but had a brief period of non-use due to a cold. Follow-up with sleep specialist is needed as previous visit indicated unsatisfactory numbers. Discussed the importance of consistent CPAP use for optimal outcomes. - Continue using CPAP every night - Follow up with sleep specialist for further evaluation  General Health Maintenance Engaging in regular physical activity (walking 20 minutes, three times per week) and monitoring dietary intake. Discussed the benefits of regular exercise and balanced diet for overall health. - Continue regular physical activity - Maintain a balanced diet with adequate protein and calorie intake  Follow-up - Schedule follow-up appointment in three weeks.       Objective   Physical Exam:  Blood pressure 135/80, pulse 83, temperature 97.8 F (36.6 C), height 5' 8 (1.727 m), weight 249 lb (112.9 kg), SpO2 99%. Body mass index is 37.86 kg/m.  General: She is overweight, cooperative, alert, well developed, and in no acute distress. PSYCH: Has normal mood, affect and thought process.   HEENT: EOMI, sclerae are anicteric. Lungs: Normal breathing effort, no conversational dyspnea. Extremities: No edema.  Neurologic: No gross sensory or motor deficits. No tremors or fasciculations noted.    Diagnostic Data Reviewed:  BMET    Component Value Date/Time   NA 144 03/16/2023 1454   NA 143 12/12/2022 1100   NA 143 02/16/2017 1450   NA 142 01/19/2016 1141   K 4.1 03/16/2023 1454   K 3.7 02/16/2017 1450   K 3.7 01/19/2016 1141   CL 105 03/16/2023 1454   CL 105 02/16/2017 1450   CO2 32 03/16/2023 1454   CO2 32  02/16/2017 1450   CO2 27 01/19/2016 1141   GLUCOSE 120 (H) 03/16/2023 1454   GLUCOSE 88 02/16/2017 1450   BUN 20 03/16/2023 1454   BUN 17 12/12/2022 1100   BUN 14 02/16/2017 1450   BUN 19.5 01/19/2016 1141   CREATININE 1.06 (H) 03/16/2023 1454   CREATININE 1.2 02/16/2017 1450   CREATININE 0.9 01/19/2016 1141   CALCIUM  9.9 03/16/2023 1454   CALCIUM  8.8 02/16/2017 1450   CALCIUM  8.8 01/19/2016 1141   GFRNONAA 57 (L) 03/16/2023 1454   GFRAA >60 12/25/2019 1353   Lab Results  Component Value Date   HGBA1C 6.3 (H) 12/12/2022   HGBA1C 6.2 (H) 10/14/2007   Lab Results  Component Value Date   INSULIN  20.4 12/12/2022   INSULIN  12.6 05/19/2021   Lab Results  Component Value Date   TSH 4.110 12/12/2022   CBC    Component Value Date/Time   WBC 6.1 03/16/2023 1454   WBC 4.5 03/12/2023 0721  RBC 4.45 03/16/2023 1454   HGB 12.7 03/16/2023 1454   HGB 12.1 02/16/2017 1450   HCT 39.5 03/16/2023 1454   HCT 36.6 02/16/2017 1450   PLT 228 03/16/2023 1454   PLT 239 02/16/2017 1450   MCV 88.8 03/16/2023 1454   MCV 87 02/16/2017 1450   MCH 28.5 03/16/2023 1454   MCHC 32.2 03/16/2023 1454   RDW 14.9 03/16/2023 1454   RDW 15.4 02/16/2017 1450   Iron Studies    Component Value Date/Time   IRON 59 12/05/2021 1451   IRON 49 02/16/2017 1536   TIBC 323 12/05/2021 1451   TIBC 284 02/16/2017 1536   FERRITIN 51 12/05/2021 1452   FERRITIN 66 02/16/2017 1536   IRONPCTSAT 18 12/05/2021 1451   IRONPCTSAT 17 (L) 02/16/2017 1536   Lipid Panel     Component Value Date/Time   CHOL 133 06/05/2022 1320   TRIG 49.0 06/05/2022 1320   HDL 71.80 06/05/2022 1320   CHOLHDL 2 06/05/2022 1320   VLDL 9.8 06/05/2022 1320   LDLCALC 51 06/05/2022 1320   Hepatic Function Panel     Component Value Date/Time   PROT 7.5 03/16/2023 1454   PROT 7.1 12/12/2022 1100   PROT 7.3 02/16/2017 1450   PROT 7.3 01/19/2016 1141   ALBUMIN 4.1 03/16/2023 1454   ALBUMIN 4.3 12/12/2022 1100   ALBUMIN 3.6  01/19/2016 1141   AST 18 03/16/2023 1454   AST 21 01/19/2016 1141   ALT 16 03/16/2023 1454   ALT 27 02/16/2017 1450   ALT 18 01/19/2016 1141   ALKPHOS 53 03/16/2023 1454   ALKPHOS 57 02/16/2017 1450   ALKPHOS 59 01/19/2016 1141   BILITOT 0.4 03/16/2023 1454   BILITOT 0.31 01/19/2016 1141   BILIDIR 0.0 01/08/2012 1224   IBILI 0.2 12/30/2010 1515      Component Value Date/Time   TSH 4.110 12/12/2022 1100   Nutritional Lab Results  Component Value Date   VD25OH 61.5 12/12/2022   VD25OH 42.2 05/19/2021    Follow-Up   Return in about 3 weeks (around 05/21/2023) for For Weight Mangement with Dr. Francyne.SABRA She was informed of the importance of frequent follow up visits to maximize her success with intensive lifestyle modifications for her multiple health conditions.  Attestation Statement   Reviewed by clinician on day of visit: allergies, medications, problem list, medical history, surgical history, family history, social history, and previous encounter notes.     Lucas Francyne, MD

## 2023-05-02 ENCOUNTER — Encounter: Payer: Self-pay | Admitting: Gastroenterology

## 2023-05-07 ENCOUNTER — Ambulatory Visit: Payer: Medicare Other | Admitting: Dietician

## 2023-05-07 ENCOUNTER — Telehealth: Payer: Self-pay | Admitting: Gastroenterology

## 2023-05-07 NOTE — Telephone Encounter (Signed)
 Received MyChart message from patient requesting medications and referrals.  Please call patient and advise.  Thank you.

## 2023-05-07 NOTE — Telephone Encounter (Signed)
 Referral was placed on 12/26 to [facility]  the referral says ready for scheduling. I will have to contact their office

## 2023-05-07 NOTE — Telephone Encounter (Addendum)
 Please start prior authorization on Aciphex. She needs name brand. Thank you

## 2023-05-07 NOTE — Telephone Encounter (Signed)
 Pharmacy team notified of the need for a prior authorization on Aciphex dispense as written (name brand). The patient is inquiring on the status of her endocrinology referral. Will ask Robin. Also seeking a female PCP and asks for recommendations.

## 2023-05-07 NOTE — Telephone Encounter (Signed)
 I called, they scheduled patient for 2/27 at 2:20 pm  She has to have lab work done before her appointment there. That is scheduled on 06/18/2023 at 10 am on the 1 st floor of their building   Called and discussed appointments with patient she is agreeable to the lab work and date and time of appointment with Dr Dartha

## 2023-05-11 ENCOUNTER — Other Ambulatory Visit (HOSPITAL_COMMUNITY): Payer: Self-pay

## 2023-05-11 ENCOUNTER — Encounter: Payer: Self-pay | Admitting: Family

## 2023-05-11 ENCOUNTER — Telehealth: Payer: Self-pay

## 2023-05-11 MED ORDER — ACIPHEX 20 MG PO TBEC: 20.0000 mg | DELAYED_RELEASE_TABLET | Freq: Two times a day (BID) | ORAL | 3 refills | Status: DC

## 2023-05-11 NOTE — Telephone Encounter (Signed)
PA request has been Submitted. New Encounter created for follow up. For additional info see Pharmacy Prior Auth telephone encounter from 01/17.

## 2023-05-11 NOTE — Telephone Encounter (Signed)
*  Gastro  Pharmacy Patient Advocate Encounter   Received notification from Pt Calls Messages that prior authorization for Aciphex 20MG  dr tablets  is required/requested.   Insurance verification completed.   The patient is insured through [employer] .   Per test claim: PA required; PA submitted to above mentioned insurance via CoverMyMeds Key/confirmation #/EOC [id number] Status is pending

## 2023-05-11 NOTE — Telephone Encounter (Signed)
Sent Aciphex to [facility]

## 2023-05-14 ENCOUNTER — Encounter: Payer: Medicare Other | Attending: Internal Medicine | Admitting: Dietician

## 2023-05-14 ENCOUNTER — Encounter: Payer: Self-pay | Admitting: Dietician

## 2023-05-14 DIAGNOSIS — R1310 Dysphagia, unspecified: Secondary | ICD-10-CM | POA: Diagnosis not present

## 2023-05-14 DIAGNOSIS — E1169 Type 2 diabetes mellitus with other specified complication: Secondary | ICD-10-CM | POA: Diagnosis not present

## 2023-05-14 NOTE — Progress Notes (Signed)
Medical Nutrition Therapy  Appointment Start time:  1415  Appointment End time:  1500 She was last seen by this RD on 03/30/2023.  She has had not had further throat dilations since before her last visit with me. States that she thinks it may be time for another one due to increased pain. She has found a liquid vitamin and wondered if she could take this.  Reviewed this and it would be fine for her to take.  She may not need additional vitamin D if she takes this. She states that her esophagus is more painful but is not ready to get it re-dialated.   Meat causes the most pain. Continues to chew food to the consistency of mashed potatoes She has been documenting her intake.   Uses a walking pad 30 minutes 3 times per week.  Knee swells if she does this more frequently. Uses the peddler when she is watching TV during the commercials. She states that she needs to get a part time job to get out more.  Primary concerns today: Patient has had dysphagia in the past.  This was dilated the week before Thanksgiving and had been having this done weekly for about 3 weeks and now prn.  History of Nissan Fundoplication 2004 and the wrap is too tight per patient.  She has a mild sore throat from multiple procedures. Drinks hot water prior to eating to relax her esophagus.  [facility]  She states that she was referred as she cannot tolerate all of the foods that are recommended on their plan. She is tracking her intake and states that she does not eat much.  We looked at her ability She states that she now should eat 1200 calories per day. Yesterday she ate and estimated 1400 calories and 95 grams protein based on corrections. Chews her food until it is the consistency of mashed potatoes. She states that she wishes to exercise again as she lost 10 lbs when she went on vacation and had increased walking.  Referral diagnosis: dysphagia, type 2 diabetes, obesity, OSA  on C-pap Preferred learning style: no preference indicated Learning readiness: ready, change in progress   NUTRITION ASSESSMENT  Weight hx: 68.5" 250 lbs 05/14/2023 261 lbs 03/30/2023 260 lbs  250 lbs 2014 229 lbs with knee surgery 210 lbs 2000 when she married  Clinical Medical Hx: Type 2 diabetes (about 2014), Nissan Fundoplication, GERD, HTN, thyroid disease, anemia, vitamin D deficiency Medications include: Mounjaro, Xarelto, Crestor Labs: A1C 6 .3% 12/12/2022, eGFR 57 03/16/2023 Notable Signs/Symptoms: dysphagia  Lifestyle & Dietary Hx Patient lives with her husband.  She does the shopping and cooking.  She is retired from the [employer]. Hurt knee, required a knee replacement and has not gone to the gym since.  She does use a peddler and she has not gone back to the gym.  Has a close friend that has a gym and is a Psychologist, educational in [employer].  Supplements: gummy vitamins Sleep:  Stress / self-care:  Current average weekly physical activity: ADL's  24-Hr Dietary Recall First Meal: fairlife protein shake Snack: none Second Meal: [place] salad from [employer] (chicken) Third Meal: [place] soup and crackers Snack: Chobani yogurt Snack: Activia yogurt, pickled beets Beverages: water  Estimated Energy Needs Calories: 1200 Carbohydrate: 120g Protein: 90g Fat: 40g   NUTRITION DIAGNOSIS  NB-1.1 Food and nutrition-related knowledge deficit As related to balance of carbohydrates, protein, and fat as well as  foods for dysphagia.  As evidenced by diet hx and patient report.   NUTRITION INTERVENTION  Nutrition education (E-1) on the following topics: initial visit.  Today we focussed on eating ideas due to dysphagia How to enter food portion sizes into her tracking app Eating window Macronutrient distribution Sources of protein and choices best for dysphagia Diet, dysphagia symptoms and obtaining adequate nutrition when it is causing most problems. Medication  review Eating window to avoid late eating before bed Mindfulness, importance of habit change Exercise benefits and goal setting  Handouts Provided Include    Learning Style & Readiness for Change Teaching method utilized: Visual & Auditory  Demonstrated degree of understanding via: Teach Back  Barriers to learning/adherence to lifestyle change: none  Goals Established by Pt Patient wishes to speak with her pharmacist regarding medications that can be crushed Patient wishes to speak to her friend Madison Hickman to help with consistency of exercise.   Consistently track her intake  Aim for 20-30 grams of protein with each meal  Very soft meats (moist cooking methods may be best)  Cottage cheese  Yogurt (particularly greek)  PB2 (peanut butter powder - unsweetened)  Soft scrambled eggs  Protein shakes  When you set up your app in the premium plan:  30% protein, 30% fat, 40% carbs  Exercise:  Aim to be more active [name]  with [employer] - patient goal  Walk or other aerobic exercise 30 minutes (start slow and increase as tolerated) Bands or weights 2 times per week   Avoid gummy vitamins Ask your pharmacist about a chewable vitamin that is not a gummy Ask your pharmacist about your medications and if they should be crushed.  When you are unable to swallow, you will need to rely on liquids during those times and need to be mindful to get your vitamins, minerals and adequate protein.  Consider Orgain plant based protein shakes (mix with 1/2 banana and fairlife milk or plant based milk of choice) Avoid bread  Consider your eating window to avoid eating too late.  MONITORING & EVALUATION Dietary intake, weekly physical activity, and label reading in 2 months.  Next Steps  Patient is to call for questions.

## 2023-05-14 NOTE — Patient Instructions (Addendum)
Consistently track her intake  Aim for 20-30 grams of protein with each meal  Very soft meats (moist cooking methods may be best)  Cottage cheese  Yogurt (particularly greek)  PB2 (peanut butter powder - unsweetened)  Soft scrambled eggs  Protein shakes  When you set up your app in the premium plan:  30% protein, 30% fat, 40% carbs  Exercise:  Aim to be more active [name]  with [employer] - patient goal  Walk or other aerobic exercise 30 minutes (start slow and increase as tolerated) Bands or weights 2 times per week  Great job with being more mindful with exercise.   When you are unable to swallow, you will need to rely on liquids during those times and need to be mindful to get your vitamins, minerals and adequate protein.  Consider Orgain plant based protein shakes (mix with 1/2 banana and fairlife milk or plant based milk of choice) Avoid bread  Consider your eating window to avoid eating too late.

## 2023-05-22 ENCOUNTER — Other Ambulatory Visit: Payer: Self-pay | Admitting: Internal Medicine

## 2023-05-22 ENCOUNTER — Other Ambulatory Visit: Payer: Self-pay | Admitting: Gastroenterology

## 2023-05-22 DIAGNOSIS — J069 Acute upper respiratory infection, unspecified: Secondary | ICD-10-CM

## 2023-05-22 DIAGNOSIS — M47896 Other spondylosis, lumbar region: Secondary | ICD-10-CM | POA: Diagnosis not present

## 2023-05-23 ENCOUNTER — Telehealth: Payer: Self-pay

## 2023-05-23 ENCOUNTER — Other Ambulatory Visit (HOSPITAL_COMMUNITY): Payer: Self-pay

## 2023-05-23 NOTE — Telephone Encounter (Signed)
Pharmacy Patient Advocate Encounter   Received notification from CoverMyMeds that prior authorization for Hyoscyamine Sulfate 0.125MG  sublingual tablets is required/requested.   Insurance verification completed.   The patient is insured through  [employer]  .   Per test claim: PA required; PA submitted to above mentioned insurance via CoverMyMeds Key/confirmation #/EOC [id number] Status is pending

## 2023-05-23 NOTE — Telephone Encounter (Signed)
Pharmacy Patient Advocate Encounter  Received notification from [employer] that Prior Authorization for HYOSCYAMINE 0.125MG  has been CANCELLED due to   Pharmacy Patient Advocate Encounter  Insurance verification completed.   The patient is insured through [facility]   Ran test claim for HYOSCYAMINE 0.125MG . Currently a quantity of 90 is a 15 day supply and the co-pay is 2.49 . The current 15 day co-pay is, $2.49.  No PA needed at this time.  This test claim was processed through [facility]- copay amounts may vary at other pharmacies due to pharmacy/plan contracts, or as the patient moves through the different stages of their insurance plan.

## 2023-05-24 ENCOUNTER — Telehealth: Payer: Self-pay | Admitting: Pharmacy Technician

## 2023-05-24 MED ORDER — DOXEPIN HCL 6 MG PO TABS: 1.0000 | ORAL_TABLET | Freq: Every evening | ORAL | 0 refills | Status: DC | PRN

## 2023-05-24 NOTE — Telephone Encounter (Signed)
  Please inform the patient of her insurance decision. Stop temazepam. Start doxepin 6 mg nightly as needed insomnia.  Prescription sent, 30-day as a trial

## 2023-05-24 NOTE — Telephone Encounter (Signed)
Pharmacy Patient Advocate Encounter   Received notification from CoverMyMeds that prior authorization for Temazepam 30MG  capsules is required/requested.   Insurance verification completed.   The patient is insured through  [facility]  .   Per test claim: PA required; PA submitted to above mentioned insurance via CoverMyMeds Key/confirmation #/EOC [id number] Status is pending

## 2023-05-24 NOTE — Telephone Encounter (Signed)
Mychart message sent.

## 2023-05-24 NOTE — Telephone Encounter (Signed)
PA denied. Temazepam only covered for short term insomnia and when Pt has tried and failed doxepin 3mg  or 6mg .

## 2023-05-28 ENCOUNTER — Encounter (INDEPENDENT_AMBULATORY_CARE_PROVIDER_SITE_OTHER): Payer: Self-pay | Admitting: Internal Medicine

## 2023-05-28 ENCOUNTER — Ambulatory Visit (INDEPENDENT_AMBULATORY_CARE_PROVIDER_SITE_OTHER): Payer: Medicare Other | Admitting: Internal Medicine

## 2023-05-28 VITALS — BP 126/79 | HR 81 | Temp 97.9°F | Ht 68.0 in | Wt 243.0 lb

## 2023-05-28 DIAGNOSIS — E1169 Type 2 diabetes mellitus with other specified complication: Secondary | ICD-10-CM | POA: Diagnosis not present

## 2023-05-28 DIAGNOSIS — Z6838 Body mass index (BMI) 38.0-38.9, adult: Secondary | ICD-10-CM | POA: Diagnosis not present

## 2023-05-28 DIAGNOSIS — I10 Essential (primary) hypertension: Secondary | ICD-10-CM | POA: Diagnosis not present

## 2023-05-28 DIAGNOSIS — G4733 Obstructive sleep apnea (adult) (pediatric): Secondary | ICD-10-CM | POA: Diagnosis not present

## 2023-05-28 DIAGNOSIS — E66812 Obesity, class 2: Secondary | ICD-10-CM | POA: Diagnosis not present

## 2023-05-28 MED ORDER — DOXEPIN HCL 6 MG PO TABS: 1.0000 | ORAL_TABLET | Freq: Every evening | ORAL | 0 refills | Status: DC | PRN

## 2023-05-28 MED ORDER — TIRZEPATIDE 5 MG/0.5ML ~~LOC~~ SOAJ: 5.0000 mg | SUBCUTANEOUS | 0 refills | Status: DC

## 2023-05-28 NOTE — Addendum Note (Signed)
Addended by: Conrad [place] D on: 05/28/2023 08:06 AM   Modules accepted: Orders

## 2023-05-28 NOTE — Assessment & Plan Note (Signed)
Lost 18 lbs since October . She is on now on monjauro Patient is inquiring about GLP-1 therapy.  Her food recall suggest a high percentage of carbs and smaller proportion of protein, fruits and vegetables.  She continues to work on increasing her protein intake.  We also reviewed the importance of increasing physical activity.  This will definitely affect her weight loss rate as she also has diabetes.     Because of nutritional intolerances she will be referred to a registered dietitian for more tailored approach.  Patient will be started on Mounjaro 2.5 mg once a week for diabetes management and assistance with weight management.

## 2023-05-28 NOTE — Progress Notes (Signed)
Office: [phone]  /  Fax: [phone]  Weight Summary And Biometrics  Vitals Temp: 97.9 F (36.6 C) BP: 126/79 Pulse Rate: 81 SpO2: 98 %   Anthropometric Measurements Height: 5\' 8"  (1.727 m) Weight: 243 lb (110.2 kg) BMI (Calculated): 36.96 Weight at Last Visit: 249 lb Weight Lost Since Last Visit: 6 lb Weight Gained Since Last Visit: 0 lb Starting Weight: 256 lb Total Weight Loss (lbs): 13 lb (5.897 kg) Peak Weight: 259 lb   Body Composition  Body Fat %: 46.9 % Fat Mass (lbs): 114 lbs Muscle Mass (lbs): 122.6 lbs Total Body Water (lbs): 83 lbs Visceral Fat Rating : 15    RMR: [id number]  Today's Visit #: 7  Starting Date: 12/12/22   Subjective   Chief Complaint: Obesity  Jennifer [name] is here to discuss her progress with her obesity treatment plan. She is on the the Category 2 Plan and states she is following her eating plan approximately 85 % of the time. She states she is not exercising.  Weight Progress Since Last Visit:  Since last office visit she has lost 6 pounds. She reports good adherence to reduced calorie nutritional plan. She has been working on reading food labels, not skipping meals, increasing protein intake at every meal, drinking more water, making healthier choices, reducing portion sizes, and incorporating more whole foods   Challenges affecting patient progress: low volume of physical activity at present .   Orexigenic Control: Denies problems with appetite and hunger signals.  Denies problems with satiety and satiation.  Denies problems with eating patterns and portion control.  Denies abnormal cravings. Denies feeling deprived or restricted.   Pharmacotherapy for weight management: She is currently taking Monjauro with diabetes as the primary indication with adequate clinical response  and without side effects..   Assessment and Plan   Treatment Plan For Obesity:  Recommended Dietary Goals  Jennifer [name] is currently in the action stage of  change. As such, her goal is to continue weight management plan. She has agreed to: continue current plan  Behavioral Health and Counseling  We discussed the following behavioral modification strategies today: continue to work on maintaining a reduced calorie state, getting the recommended amount of protein, incorporating whole foods, making healthy choices, staying well hydrated and practicing mindfulness when eating..  Additional education and resources provided today: None  Recommended Physical Activity Goals  Jennifer [name] has been advised to work up to 150 minutes of moderate intensity aerobic activity a week and strengthening exercises 2-3 times per week for cardiovascular health, weight loss maintenance and preservation of muscle mass.   She has agreed to :  Think about enjoyable ways to increase daily physical activity and overcoming barriers to exercise and Increase physical activity in their day and reduce sedentary time (increase NEAT).  Pharmacotherapy  We discussed various medication options to help Jennifer [name] with her weight loss efforts and we both agreed to : adequate clinical response to current dose, continue current regimen  Associated Conditions Impacted by Obesity Treatment  Essential hypertension  OSA on CPAP  Type 2 diabetes mellitus with other specified complication, without long-term current use of insulin (HCC)  Class 2 severe obesity with serious comorbidity and body mass index (BMI) of 38.0 to 38.9 in adult, unspecified obesity type (HCC)    Assessment and Plan    Obesity Lost 18 pounds since December, with 6 pounds lost since the last visit. Adhering to dietary recommendations, including increased protein intake and portion control, facilitated by [facility] No significant  side effects from [place] reported. Discussed the importance of not skipping meals to ensure adequate nutrition. - Continue Mounjaro at current dose - Send refill to [facility] - Follow up in one  month  Dysphagia Ongoing difficulty swallowing solid meats. Reluctant to undergo dilatation. Using applesauce to take crushed pills but seeking alternatives. Discussed [place] yogurt as a potential alternative due to its protein content and soft texture. - Consider Greek yogurt as an alternative to applesauce for taking crushed pills - Discuss potential benefits and risks of dilatation in future visits  Gastroesophageal Reflux Disease (GERD) GERD with more diarrhea than constipation, likely due to previous stomach surgeries. No recent exacerbations reported. - Monitor symptoms and adjust treatment as necessary  Arthritis Lower back pain diagnosed as arthritis. Advised to start water aerobics but have not yet begun rehabilitation. Advised against walking by another provider but encouraged to engage in low-intensity exercise. Discussed the importance of maintaining muscle strength and core stability through light walking and chair exercises. - Encourage light walking and chair exercises - Start water aerobics after consultation on February 28  Diabetes Mellitus Last A1c was 6.3 in August. Scheduled for follow-up blood work on February 14 with primary care doctor. Advised to keep carbohydrate intake low to manage insulin resistance and support weight loss. - Review A1c and other blood work results after February 14 appointment  Hypertension Blood pressure well-controlled at 126/79. Resumed taking a full pill of blood pressure medication after experiencing symptoms with a half dose. - Continue current blood pressure medication regimen  Obstructive Sleep Apnea Continues to use CPAP. No new issues reported. - Continue CPAP use - Follow up with Dr. Cathi Roan as needed  General Health Maintenance Seeking a suitable liquid multivitamin due to difficulty swallowing pills. Advised against expensive supplements with minimal nutritional value. Discussed more affordable options and the potential use of  gummy vitamins. - Consider more affordable liquid multivitamins from [employer] or [employer] - Explore gummy vitamins as an alternative  Follow-up - Follow up with primary care doctor on February 14 for blood work - Consultation for [employer] on February 28 - Follow up with this provider in one month.        Objective   Physical Exam:  Blood pressure 126/79, pulse 81, temperature 97.9 F (36.6 C), height 5\' 8"  (1.727 m), weight 243 lb (110.2 kg), SpO2 98%. Body mass index is 36.95 kg/m.  General: She is overweight, cooperative, alert, well developed, and in no acute distress. PSYCH: Has normal mood, affect and thought process.   HEENT: EOMI, sclerae are anicteric. Lungs: Normal breathing effort, no conversational dyspnea. Extremities: No edema.  Neurologic: No gross sensory or motor deficits. No tremors or fasciculations noted.    Diagnostic Data Reviewed:  BMET    Component Value Date/Time   NA 144 03/16/2023 1454   NA 143 12/12/2022 1100   NA 143 02/16/2017 1450   NA 142 01/19/2016 1141   K 4.1 03/16/2023 1454   K 3.7 02/16/2017 1450   K 3.7 01/19/2016 1141   CL 105 03/16/2023 1454   CL 105 02/16/2017 1450   CO2 32 03/16/2023 1454   CO2 32 02/16/2017 1450   CO2 27 01/19/2016 1141   GLUCOSE 120 (H) 03/16/2023 1454   GLUCOSE 88 02/16/2017 1450   BUN 20 03/16/2023 1454   BUN 17 12/12/2022 1100   BUN 14 02/16/2017 1450   BUN 19.5 01/19/2016 1141   CREATININE 1.06 (H) 03/16/2023 1454   CREATININE 1.2 02/16/2017 1450  CREATININE 0.9 01/19/2016 1141   CALCIUM 9.9 03/16/2023 1454   CALCIUM 8.8 02/16/2017 1450   CALCIUM 8.8 01/19/2016 1141   GFRNONAA 57 (L) 03/16/2023 1454   GFRAA >60 12/25/2019 1353   Lab Results  Component Value Date   HGBA1C 6.3 (H) 12/12/2022   HGBA1C 6.2 (H) 10/14/2007   Lab Results  Component Value Date   INSULIN 20.4 12/12/2022   INSULIN 12.6 05/19/2021   Lab Results  Component Value Date   TSH 4.110 12/12/2022   CBC     Component Value Date/Time   WBC 6.1 03/16/2023 1454   WBC 4.5 03/12/2023 0721   RBC 4.45 03/16/2023 1454   HGB 12.7 03/16/2023 1454   HGB 12.1 02/16/2017 1450   HCT 39.5 03/16/2023 1454   HCT 36.6 02/16/2017 1450   PLT 228 03/16/2023 1454   PLT 239 02/16/2017 1450   MCV 88.8 03/16/2023 1454   MCV 87 02/16/2017 1450   MCH 28.5 03/16/2023 1454   MCHC 32.2 03/16/2023 1454   RDW 14.9 03/16/2023 1454   RDW 15.4 02/16/2017 1450   Iron Studies    Component Value Date/Time   IRON 59 12/05/2021 1451   IRON 49 02/16/2017 1536   TIBC 323 12/05/2021 1451   TIBC 284 02/16/2017 1536   FERRITIN 51 12/05/2021 1452   FERRITIN 66 02/16/2017 1536   IRONPCTSAT 18 12/05/2021 1451   IRONPCTSAT 17 (L) 02/16/2017 1536   Lipid Panel     Component Value Date/Time   CHOL 133 06/05/2022 1320   TRIG 49.0 06/05/2022 1320   HDL 71.80 06/05/2022 1320   CHOLHDL 2 06/05/2022 1320   VLDL 9.8 06/05/2022 1320   LDLCALC 51 06/05/2022 1320   Hepatic Function Panel     Component Value Date/Time   PROT 7.5 03/16/2023 1454   PROT 7.1 12/12/2022 1100   PROT 7.3 02/16/2017 1450   PROT 7.3 01/19/2016 1141   ALBUMIN 4.1 03/16/2023 1454   ALBUMIN 4.3 12/12/2022 1100   ALBUMIN 3.6 01/19/2016 1141   AST 18 03/16/2023 1454   AST 21 01/19/2016 1141   ALT 16 03/16/2023 1454   ALT 27 02/16/2017 1450   ALT 18 01/19/2016 1141   ALKPHOS 53 03/16/2023 1454   ALKPHOS 57 02/16/2017 1450   ALKPHOS 59 01/19/2016 1141   BILITOT 0.4 03/16/2023 1454   BILITOT 0.31 01/19/2016 1141   BILIDIR 0.0 01/08/2012 1224   IBILI 0.2 12/30/2010 1515      Component Value Date/Time   TSH 4.110 12/12/2022 1100   Nutritional Lab Results  Component Value Date   VD25OH 61.5 12/12/2022   VD25OH 42.2 05/19/2021    Follow-Up   No follow-ups on file.Marland Kitchen She was informed of the importance of frequent follow up visits to maximize her success with intensive lifestyle modifications for her multiple health conditions.  Attestation  Statement   Reviewed by clinician on day of visit: allergies, medications, problem list, medical history, surgical history, family history, social history, and previous encounter notes.     Worthy Rancher, MD

## 2023-05-29 ENCOUNTER — Other Ambulatory Visit: Payer: Self-pay | Admitting: Hematology & Oncology

## 2023-05-31 ENCOUNTER — Encounter: Payer: Self-pay | Admitting: Family

## 2023-05-31 ENCOUNTER — Other Ambulatory Visit (HOSPITAL_COMMUNITY): Payer: Self-pay

## 2023-05-31 NOTE — Telephone Encounter (Signed)
 Pharmacy Patient Advocate Encounter  Received notification from [facility] that Prior Authorization for ACIPHEX  20MG  has been APPROVED from 2.6.25 to 2.6.26   PA #/Case ID/Reference #:  [id number]  Called the pharmacy. It still shows up as PA required. With it being a [employer] plan it can take up to 72 hours to process at pharmacy level.

## 2023-05-31 NOTE — Telephone Encounter (Signed)
 Patient notified

## 2023-05-31 NOTE — Telephone Encounter (Signed)
What is the status of this PA?

## 2023-06-01 ENCOUNTER — Telehealth: Payer: Self-pay | Admitting: Hematology & Oncology

## 2023-06-01 NOTE — Telephone Encounter (Signed)
 Called patient and left a voicemail advising patient to call our office to reschedule appointment on 06/18/23 as Dr Maria Shiner will be out of the office that afternoon.

## 2023-06-04 ENCOUNTER — Other Ambulatory Visit (HOSPITAL_COMMUNITY): Payer: Self-pay

## 2023-06-04 ENCOUNTER — Other Ambulatory Visit: Payer: Self-pay | Admitting: Internal Medicine

## 2023-06-04 ENCOUNTER — Telehealth: Payer: Self-pay

## 2023-06-04 ENCOUNTER — Other Ambulatory Visit: Payer: Self-pay

## 2023-06-04 DIAGNOSIS — Z1231 Encounter for screening mammogram for malignant neoplasm of breast: Secondary | ICD-10-CM

## 2023-06-04 MED ORDER — ACIPHEX 20 MG PO TBEC: 20.0000 mg | DELAYED_RELEASE_TABLET | Freq: Two times a day (BID) | ORAL | 3 refills | Status: DC

## 2023-06-04 NOTE — Telephone Encounter (Signed)
 Pharmacy Patient Advocate Encounter   Received notification from CoverMyMeds that prior authorization for Aciphex  20 mg tablet is required/requested.   Insurance verification completed.   The patient is insured through [facility] .   Per test claim: PA required; PA started via CoverMyMeds. KEY [id number] . Waiting for clinical questions to populate.

## 2023-06-05 ENCOUNTER — Ambulatory Visit
Admission: RE | Admit: 2023-06-05 | Discharge: 2023-06-05 | Disposition: A | Discharge status: Home / Self Care | Payer: Medicare Other | Source: Ambulatory Visit | Attending: Internal Medicine | Admitting: Internal Medicine

## 2023-06-05 ENCOUNTER — Telehealth: Payer: Self-pay | Admitting: Pharmacy Technician

## 2023-06-05 ENCOUNTER — Other Ambulatory Visit (HOSPITAL_COMMUNITY): Payer: Self-pay

## 2023-06-05 DIAGNOSIS — Z1231 Encounter for screening mammogram for malignant neoplasm of breast: Secondary | ICD-10-CM

## 2023-06-05 NOTE — Telephone Encounter (Signed)
Received the request for prior authorization of Aciphex 20mg  again. Previous approval on file. Not going through at pharmacy level. Called insurance for assistance. Approval was for a NDC that is no longer available. New PA submitted via phone with [facility] [phone] specific. Submitted as expedited. Provided new Case number: [id number].

## 2023-06-05 NOTE — Telephone Encounter (Signed)
Pharmacy Patient Advocate Encounter  Received notification from [facility] that Prior Authorization for ACIPHEX 20MG  has been CANCELLED due to  Called and spoke with pt insurance after second approval came through but for Rabeprazole. Verified information from earlier PA request for [facility]. After further review Medicare no longer covers BRAND Aciphex. It is excluded from the plan, and is not available with prior authorization. The NDC that was approved originally is for an [facility] that is no longer available. The NDC provided to rep is excluded from the plan and no other NDC are covered for brand Aciphex. Generic Rabeprazole is available, Please provide alternative for this patient.

## 2023-06-06 MED ORDER — TEMAZEPAM 30 MG PO CAPS: 30.0000 mg | ORAL_CAPSULE | Freq: Every evening | ORAL | 3 refills | Status: DC | PRN

## 2023-06-06 NOTE — Telephone Encounter (Signed)
Intolerant to doxepin. Requesting temazepam. PDMP okay, Rx sent, proceed with a PA for temazepam if needed and let the patient know

## 2023-06-06 NOTE — Addendum Note (Signed)
Addended by: Wanda Plump on: 06/06/2023 03:35 PM   Modules accepted: Orders

## 2023-06-06 NOTE — Telephone Encounter (Signed)
Patient returning phone call. Please advise, thank you.

## 2023-06-06 NOTE — Telephone Encounter (Signed)
Called the patient to discuss alternatives. No answer. Left message of my call.

## 2023-06-07 ENCOUNTER — Telehealth: Payer: Self-pay

## 2023-06-07 DIAGNOSIS — E119 Type 2 diabetes mellitus without complications: Secondary | ICD-10-CM

## 2023-06-07 NOTE — Telephone Encounter (Signed)
Pt is unable to tolerate Doxepin.   PA initiated via Covermymeds for temazepam; KEY: [id number].  Will require an appeal.   [facility] was not able to process the request because the previous Prior Authorization Request was Denied, please submit an electronic Appeal Request. You can also contact the plan at [phone] or fax in request to [phone].

## 2023-06-07 NOTE — Telephone Encounter (Signed)
Appeal request faxed to [phone]. Awaiting determination.

## 2023-06-08 ENCOUNTER — Telehealth: Payer: Self-pay | Admitting: Gastroenterology

## 2023-06-08 ENCOUNTER — Ambulatory Visit (INDEPENDENT_AMBULATORY_CARE_PROVIDER_SITE_OTHER): Payer: Medicare Other | Admitting: Internal Medicine

## 2023-06-08 ENCOUNTER — Other Ambulatory Visit: Payer: Self-pay

## 2023-06-08 ENCOUNTER — Encounter: Payer: Self-pay | Admitting: Internal Medicine

## 2023-06-08 VITALS — BP 116/62 | HR 82 | Temp 98.2°F | Resp 18 | Ht 68.0 in | Wt 244.2 lb

## 2023-06-08 DIAGNOSIS — Z23 Encounter for immunization: Secondary | ICD-10-CM | POA: Diagnosis not present

## 2023-06-08 DIAGNOSIS — F419 Anxiety disorder, unspecified: Secondary | ICD-10-CM | POA: Diagnosis not present

## 2023-06-08 DIAGNOSIS — E785 Hyperlipidemia, unspecified: Secondary | ICD-10-CM

## 2023-06-08 DIAGNOSIS — I10 Essential (primary) hypertension: Secondary | ICD-10-CM

## 2023-06-08 DIAGNOSIS — Z7985 Long-term (current) use of injectable non-insulin antidiabetic drugs: Secondary | ICD-10-CM | POA: Diagnosis not present

## 2023-06-08 DIAGNOSIS — E1169 Type 2 diabetes mellitus with other specified complication: Secondary | ICD-10-CM

## 2023-06-08 DIAGNOSIS — G4700 Insomnia, unspecified: Secondary | ICD-10-CM | POA: Diagnosis not present

## 2023-06-08 DIAGNOSIS — Z79899 Other long term (current) drug therapy: Secondary | ICD-10-CM | POA: Diagnosis not present

## 2023-06-08 MED ORDER — HYOSCYAMINE SULFATE 0.125 MG PO TABS: 0.1250 mg | ORAL_TABLET | ORAL | 0 refills | Status: DC | PRN

## 2023-06-08 MED ORDER — NEXIUM 40 MG PO CPDR: 40.0000 mg | DELAYED_RELEASE_CAPSULE | Freq: Every day | ORAL | 3 refills | Status: DC

## 2023-06-08 NOTE — Telephone Encounter (Signed)
I did give Pt a goodRx coupon at visit today- she can get temazepam 30mg  for 15.99 at [facility].

## 2023-06-08 NOTE — Telephone Encounter (Signed)
Inbound call from patients pharmacy stating that patients insurance will not cover SL tablet and needs alternative. Please advise.

## 2023-06-08 NOTE — Telephone Encounter (Signed)
Cannot get hyoscyamine SL due to insurance. It will need to be a different formula. Also, patient reports increased epigastric burning and abdominal discomfort. She is off Aciphex. Insurance will not cover brand name. States generic made her sick and she cannot tolerate it. She wants a different brand PPI. Please advise.

## 2023-06-08 NOTE — Progress Notes (Signed)
Subjective:    Patient ID: Jennifer [name], female    DOB: [birth year], 73 y.o.   MRN: [id number]  DOS:  06/08/2023 Type of visit - description: Follow-up  No new concerns. Feeling well. On chart review she saw Dr. Shon Baton for low back pain.   Review of Systems See above   Past Medical History:  Diagnosis Date   Allergy    Anemia    Arthritis    Cataract    bil cataracts removed   Chronic chest pain    Chronic fatigue 02/03/2017   Clotting disorder [facility])    Pulmonary Embolis 2010 & 2015   Colon polyps    inflamed partially ulcerated hyperplastic polyp   Diabetes (HCC) 09/24/2013   no meds   Diverticulosis    Dysphagia esophageal phase - chronic after 3 fundoplications 01/20/2014   Edema of both lower extremities    Esophageal stenosis    Stricture after fundoplication REDO   Fibromyalgia    GERD (gastroesophageal reflux disease)    Glaucoma suspect    Heart murmur    no problems    Hemorrhoids, internal, with bleeding 06/27/2017   History of hiatal hernia    Hypertension    IBS (irritable bowel syndrome)    Lactose intolerance    Mitral valve prolapse    OSA (obstructive sleep apnea)    on CPAP-Mild - Sleep study 2004   Pre-diabetes    Pulmonary embolus (HCC)    2010 and 12-2013   Pulmonary nodule 12/13/2022   seen on CT chest xray on 12/16/22 - left upper lobe   Sleep apnea    wears CPAP   Thyroid nodule      dr. watching    Tinnitus    Subjective   Zoster 2014   R flank    Past Surgical History:  Procedure Laterality Date   ABDOMINAL HYSTERECTOMY     still has 1 ovary    APPENDECTOMY     BRAIN SURGERY  1995   Stem surgery, Arnold Chiari Malformation   CATARACT EXTRACTION Bilateral 2017   cataracts, Dr. Elmer Picker   CHOLECYSTECTOMY     COLONOSCOPY     ESOPHAGEAL DILATION N/A 02/06/2023   Procedure: ESOPHAGEAL DILATION;  Surgeon: Corliss Skains, MD;  Location: [facility] OR;  Service: Thoracic;  Laterality: N/A;   ESOPHAGEAL DILATION N/A 02/13/2023    Procedure: ESOPHAGEAL DILATION;  Surgeon: Corliss Skains, MD;  Location: [facility] OR;  Service: Thoracic;  Laterality: N/A;   ESOPHAGEAL DILATION N/A 03/12/2023   Procedure: ESOPHAGEAL DILATION;  Surgeon: Corliss Skains, MD;  Location: [facility] OR;  Service: Thoracic;  Laterality: N/A;   ESOPHAGEAL MANOMETRY N/A 08/06/2019   Procedure: ESOPHAGEAL MANOMETRY (EM);  Surgeon: Iva Boop, MD;  Location: [facility] ENDOSCOPY;  Service: Endoscopy;  Laterality: N/A;   ESOPHAGOGASTRODUODENOSCOPY N/A 02/06/2023   Procedure: ESOPHAGOGASTRODUODENOSCOPY (EGD);  Surgeon: Corliss Skains, MD;  Location[facility] OR;  Service: Thoracic;  Laterality: N/A;  With Savary dilation   ESOPHAGOGASTRODUODENOSCOPY N/A 02/13/2023   Procedure: ESOPHAGOGASTRODUODENOSCOPY (EGD);  Surgeon: Corliss Skains, MD;  Location: [facility] OR;  Service: Thoracic;  Laterality: N/A;   ESOPHAGOGASTRODUODENOSCOPY N/A 03/12/2023   Procedure: ESOPHAGOGASTRODUODENOSCOPY (EGD);  Surgeon: Corliss Skains, MD;  Location: [facility] OR;  Service: Thoracic;  Laterality: N/A;   HEMORRHOID BANDING     HERNIA REPAIR     Hital Hernia   JOINT REPLACEMENT Right    knee   KNEE ARTHROSCOPY  05/16/2012   Procedure: ARTHROSCOPY KNEE;  Surgeon: Kennieth Rad, MD;  Location: [facility] OR;  Service: Orthopedics;  Laterality: Left;   KNEE ARTHROSCOPY Right 01/2018   NISSEN FUNDOPLICATION     X 3 lab, open x 2 last with mesh   POLYPECTOMY     REDUCTION MAMMAPLASTY Bilateral 2007   TOTAL KNEE ARTHROPLASTY Right 08/25/2019   Procedure: RIGHT TOTAL KNEE ARTHROPLASTY;  Surgeon: Ollen Gross, MD;  Location: [facility] ORS;  Service: Orthopedics;  Laterality: Right;    UPPER GASTROINTESTINAL ENDOSCOPY      Current Outpatient Medications  Medication Instructions   acetaminophen (TYLENOL) 500 mg, Oral, Every 6 hours PRN   Aciphex 20 mg, Oral, 2 times daily   benzonatate (TESSALON) 200 mg, Oral, 2 times daily PRN   cycloSPORINE (RESTASIS) 0.05 % ophthalmic emulsion 1  drop, 2 times daily   fluconazole (DIFLUCAN) 100 mg, Oral, Daily   fluticasone (FLONASE) 50 MCG/ACT nasal spray 2 sprays, Each Nare, Daily   guaiFENesin (MUCINEX) 1,200 mg, Oral, 2 times daily   hydrocortisone valerate cream (WESTCORT) 0.2 % 1 Application, Daily PRN   hyoscyamine (LEVSIN SL) 0.125 MG SL tablet DISSOLVE ONE TABLET UNDER TONGUE EVERY FOUR HOURS AS NEEDED   Multiple Vitamins-Minerals (MULTIVITAMIN WITH MINERALS) tablet 2 tablets, Daily   olopatadine (PATADAY) 0.1 % ophthalmic solution 1 drop, 2 times daily   prednisoLONE acetate (PRED FORTE) 1 % ophthalmic suspension 1 drop, 2 times daily   Prevagen 10 mg, Daily   rosuvastatin (CRESTOR) 10 mg, Oral, Daily at bedtime   temazepam (RESTORIL) 30 mg, Oral, At bedtime PRN, for sleep   tirzepatide (MOUNJARO) 5 mg, Subcutaneous, Weekly   triamterene-hydrochlorothiazide (MAXZIDE-25) 37.5-25 MG tablet 0.5 tablets, Oral, Daily   Xarelto 5 mg, Oral, Daily       Objective:   Physical Exam BP 116/62   Pulse 82   Temp 98.2 F (36.8 C) (Oral)   Resp 18   Ht 5\' 8"  (1.727 m)   Wt 244 lb 4 oz (110.8 kg)   SpO2 96%   BMI 37.14 kg/m  General:   Well developed, NAD, BMI noted. HEENT:  Normocephalic . Face symmetric, atraumatic Lungs:  CTA B Normal respiratory effort, no intercostal retractions, no accessory muscle use. Heart: RRR,  no murmur.  DM foot exam: No edema, good pedal pulses, pinprick examination normal Skin: Not pale. Not jaundice Neurologic:  alert & oriented X3.  Speech normal, gait appropriate for age and unassisted Psych--  Cognition and judgment appear intact.  Cooperative with normal attention span and concentration.  Behavior appropriate. No anxious or depressed appearing.      Assessment    Assessment   DM, + neuropathy  HTN Anxiety, insomnia: On restoril prn  Pulmonary: --OSA, sleep study 2004, on a CPAP --PEs:2010 and 12-2013 - saw hematology 12-2014, rec anticoag x 2 years (until 12-2015), then  continue with a low-dose xarelto   life -- multiple pulmonary nodule, last CT 11-2013: No further imaging suggested --RAD, inhalers rarely Fibromyalgia CV:  MVP,  carotid US 1-39%s GI: --GERD,chronic dysphagia s/p  fundoplication 3, esophageal stenosis- - April 2021: Barium study, esophageal manometry (see report) -- IBS --Hemorrhoids: S/p banding ~/2019 -- chronic right flank,RUQ pain : since GB surgery 2008 Etiology not clear but likely multifactorial --> Adhesions from the surgery, neuropathy (DM), postherpetic neuralgia, scarring from PE, etc.  MRI T spine done 06-2014: DJD  Thyroid nodule: Bx (non neoplastic goiter) 2014,  [place] 05-2016, 2022 (stable) Zoster --  2014 right flank   PLAN DM,  on Mounjaro prescribed by the [facility] center, feet exam negative, check A1c and micro. High cholesterol: On rosuvastatin, last LFTs normal, check a lipid panel. Anxiety insomnia: Due to insurance request, she tried temporarily doxepin, did not work for insomnia,  she needs to go back on temazepam, we are working on the [employer] from her insurance. Thyroid nodule: To see endocrinology soon. Preventive care PNM 20 today. RTC 6 months

## 2023-06-08 NOTE — Telephone Encounter (Signed)
Please check if they will cover Levsin regular tab instead of dissolvable sublingual, please send prescription for 1 tablet every 12 hours as needed as needed.  Okay to switch to either Nexium 40 mg daily or Prevacid 30 mg daily if covered by insurance.  Please send Rx for 90 days with 3 refills.  Thank you

## 2023-06-08 NOTE — Patient Instructions (Addendum)
   GO TO THE LAB : Get the blood work     Next visit with me in 6 months for a checkup.    Please schedule it at the front desk     Per our records you are soon due for your diabetic eye exam. Please contact your eye doctor to schedule an appointment. Please have them send copies of your office visit notes to [place]. Our fax number is [phone]. If you need a referral to an eye doctor please let us know.

## 2023-06-08 NOTE — Telephone Encounter (Signed)
Called the patient. No answer. Left a voicemail with the names of the medications. Nexium 40 mg AC breakfast transmitted to the pharmacy.

## 2023-06-09 LAB — LIPID PANEL
Cholesterol: 115 mg/dL (ref <200)
HDL: 67 mg/dL (ref >=50)
LDL Cholesterol (Calc): 34 mg/dL
Non-HDL Cholesterol (Calc): 48 mg/dL (ref <130)
Total CHOL/HDL Ratio: 1.7 (calc) (ref <5.0)
Triglycerides: 64 mg/dL (ref <150)

## 2023-06-09 LAB — HEMOGLOBIN A1C
Hgb A1c MFr Bld: 6.2 %{Hb} — ABNORMAL HIGH (ref <5.7)
Mean Plasma Glucose: 131 mg/dL
eAG (mmol/L): 7.3 mmol/L

## 2023-06-09 NOTE — Assessment & Plan Note (Signed)
DM, on Mounjaro prescribed by the wellness center, feet exam negative, check A1c and micro. High cholesterol: On rosuvastatin, last LFTs normal, check a lipid panel. Anxiety insomnia: Due to insurance request, she tried temporarily doxepin, did not work for insomnia,  she needs to go back on temazepam, we are working on the [employer] from her insurance. Thyroid nodule: To see endocrinology soon. Preventive care PNM 20 today. RTC 6 months

## 2023-06-10 LAB — DM TEMPLATE

## 2023-06-10 LAB — DRUG MONITORING PANEL 375977 , URINE
Alcohol Metabolites: NEGATIVE ng/mL (ref <500)
Alphahydroxyalprazolam: NEGATIVE ng/mL (ref <25)
Alphahydroxymidazolam: NEGATIVE ng/mL (ref <50)
Alphahydroxytriazolam: NEGATIVE ng/mL (ref <50)
Aminoclonazepam: NEGATIVE ng/mL (ref <25)
Amphetamines: NEGATIVE ng/mL (ref <500)
Barbiturates: NEGATIVE ng/mL (ref <300)
Benzodiazepines: POSITIVE ng/mL — AB (ref <100)
Cocaine Metabolite: NEGATIVE ng/mL (ref <150)
Desmethyltramadol: NEGATIVE ng/mL (ref <100)
Hydroxyethylflurazepam: NEGATIVE ng/mL (ref <50)
Lorazepam: NEGATIVE ng/mL (ref <50)
Marijuana Metabolite: NEGATIVE ng/mL (ref <20)
Nordiazepam: NEGATIVE ng/mL (ref <50)
Opiates: NEGATIVE ng/mL (ref <100)
Oxazepam: 63 ng/mL — ABNORMAL HIGH (ref <50)
Oxycodone: NEGATIVE ng/mL (ref <100)
Temazepam: 241 ng/mL — ABNORMAL HIGH (ref <50)
Tramadol: NEGATIVE ng/mL (ref <100)

## 2023-06-10 LAB — MICROALBUMIN / CREATININE URINE RATIO
Creatinine, Urine: 72 mg/dL (ref 20–275)
Microalb, Ur: <0.2 mg/dL

## 2023-06-11 ENCOUNTER — Telehealth: Payer: Self-pay | Admitting: Internal Medicine

## 2023-06-11 NOTE — Telephone Encounter (Signed)
  1 minute ago (8:01 AM)   Erskin Burnet B1 minute ago (8:01 AM)   [place] Copied from [employer] [phone]. Topic: Clinical - Prescription Issue >> Jun 08, 2023  4:31 PM Irine Seal wrote: Reason for CRM: Myriam Jacobson - in the prior auth dept [facility] calling in regards to patients temazepam (RESTORIL) 30 MG capsule calling for a follow up question. She wants to know if the provider has considered if the benefits outweigh the associated risks due to the patients age callback [phone]  fax [phone]

## 2023-06-11 NOTE — Telephone Encounter (Signed)
Received form via fax w/ same question- form completed and faxed back to [facility] at [phone].

## 2023-06-11 NOTE — Telephone Encounter (Signed)
PA approved. Effective 04/25/23 to 06/10/24.

## 2023-06-11 NOTE — Telephone Encounter (Signed)
Duplicate encounter, closing this note.

## 2023-06-11 NOTE — Telephone Encounter (Signed)
Copied from CRM [id number]. Topic: Clinical - Prescription Issue >> Jun 08, 2023  4:31 PM Irine Seal wrote: Reason for CRM: Jennifer [name] - in the prior auth dept with [facility] calling in regards to patients temazepam (RESTORIL) 30 MG capsule calling for a follow up question. She wants to know if the provider has considered if the benefits outweigh the associated risks due to the patients age callback [phone]  fax [phone]

## 2023-06-12 ENCOUNTER — Encounter: Payer: Self-pay | Admitting: Family

## 2023-06-13 ENCOUNTER — Encounter: Payer: Self-pay | Admitting: Internal Medicine

## 2023-06-18 ENCOUNTER — Inpatient Hospital Stay: Payer: Medicare Other | Attending: Hematology & Oncology

## 2023-06-18 ENCOUNTER — Encounter: Payer: Self-pay | Admitting: Family

## 2023-06-18 ENCOUNTER — Other Ambulatory Visit: Payer: Medicare Other

## 2023-06-18 ENCOUNTER — Inpatient Hospital Stay (HOSPITAL_BASED_OUTPATIENT_CLINIC_OR_DEPARTMENT_OTHER): Payer: Medicare Other | Admitting: Family

## 2023-06-18 VITALS — BP 118/64 | HR 75 | Temp 98.6°F | Resp 18 | Ht 68.0 in | Wt 246.0 lb

## 2023-06-18 DIAGNOSIS — I2601 Septic pulmonary embolism with acute cor pulmonale: Secondary | ICD-10-CM | POA: Diagnosis not present

## 2023-06-18 DIAGNOSIS — Z86711 Personal history of pulmonary embolism: Secondary | ICD-10-CM | POA: Diagnosis not present

## 2023-06-18 DIAGNOSIS — I2690 Septic pulmonary embolism without acute cor pulmonale: Secondary | ICD-10-CM | POA: Diagnosis not present

## 2023-06-18 DIAGNOSIS — Z7901 Long term (current) use of anticoagulants: Secondary | ICD-10-CM | POA: Diagnosis not present

## 2023-06-18 DIAGNOSIS — I2699 Other pulmonary embolism without acute cor pulmonale: Secondary | ICD-10-CM

## 2023-06-18 LAB — CMP (CANCER CENTER ONLY)
ALT: 32 U/L (ref 0–44)
AST: 26 U/L (ref 15–41)
Albumin: 4.1 g/dL (ref 3.5–5.0)
Alkaline Phosphatase: 51 U/L (ref 38–126)
Anion gap: 7 (ref 5–15)
BUN: 20 mg/dL (ref 8–23)
CO2: 31 mmol/L (ref 22–32)
Calcium: 9.5 mg/dL (ref 8.9–10.3)
Chloride: 104 mmol/L (ref 98–111)
Creatinine: 0.98 mg/dL (ref 0.44–1.00)
GFR, Estimated: >60 mL/min (ref >60)
Glucose, Bld: 85 mg/dL (ref 70–99)
Potassium: 4.1 mmol/L (ref 3.5–5.1)
Sodium: 142 mmol/L (ref 135–145)
Total Bilirubin: 0.4 mg/dL (ref 0.0–1.2)
Total Protein: 7.2 g/dL (ref 6.5–8.1)

## 2023-06-18 LAB — D-DIMER, QUANTITATIVE: D-Dimer, Quant: 0.34 ug{FEU}/mL (ref 0.00–0.50)

## 2023-06-18 LAB — CBC WITH DIFFERENTIAL (CANCER CENTER ONLY)
Abs Immature Granulocytes: 0.01 10*3/uL (ref 0.00–0.07)
Basophils Absolute: 0 10*3/uL (ref 0.0–0.1)
Basophils Relative: 1 %
Eosinophils Absolute: 0.1 10*3/uL (ref 0.0–0.5)
Eosinophils Relative: 2 %
HCT: 37.1 % (ref 36.0–46.0)
Hemoglobin: 12.1 g/dL (ref 12.0–15.0)
Immature Granulocytes: 0 %
Lymphocytes Relative: 41 %
Lymphs Abs: 1.9 10*3/uL (ref 0.7–4.0)
MCH: 28.5 pg (ref 26.0–34.0)
MCHC: 32.6 g/dL (ref 30.0–36.0)
MCV: 87.5 fL (ref 80.0–100.0)
Monocytes Absolute: 0.6 10*3/uL (ref 0.1–1.0)
Monocytes Relative: 12 %
Neutro Abs: 2 10*3/uL (ref 1.7–7.7)
Neutrophils Relative %: 44 %
Platelet Count: 209 10*3/uL (ref 150–400)
RBC: 4.24 MIL/uL (ref 3.87–5.11)
RDW: 15 % (ref 11.5–15.5)
WBC Count: 4.5 10*3/uL (ref 4.0–10.5)
nRBC: 0 % (ref 0.0–0.2)

## 2023-06-18 NOTE — Progress Notes (Signed)
 Hematology and Oncology Follow Up Visit  Jennifer [name] [id number] Sep 01, 1950 73 y.o. 06/18/2023   Principle Diagnosis:  Recurrent pulmonary embolism   Current Therapy:        Xarelto 5 mg po q day - lifelong              Interim History:  Jennifer [name] is here today for follow-up. She is doing well and has no complaints at this time.  She is doing well on maintenance Xarelto. No blood loss, abnormal bruising or petechiae.  She has esophageal strictures and feels that she may have to go back for dilation soon.  No fever, chills, n/v, cough, rash, dizziness, SOB, chest pain, palpitations, abdominal pain or changes in bowel or bladder habits.  No swelling, tenderness, numbness or tingling in her extremities at this time.  No falls or syncope.  Appetite and hydration are good. Weight is stable at 246 lbs.   ECOG Performance Status: 0 - Asymptomatic  Medications:  Allergies as of 06/18/2023       Reactions   Doxepin Nausea And Vomiting, Other (See Comments)   "Feeling loopy"   Morphine And Codeine Anaphylaxis, Shortness Of Breath   Sulfonamide Derivatives Hives   Burn from inside out   Promethazine Hcl Other (See Comments)   Hallucinations        Medication List        Accurate as of June 18, 2023  3:14 PM. If you have any questions, ask your nurse or doctor.          STOP taking these medications    prednisoLONE acetate 1 % ophthalmic suspension Commonly known as: PRED FORTE Stopped by: Jennifer [name]       TAKE these medications    acetaminophen 500 MG tablet Commonly known as: TYLENOL Take 1 tablet (500 mg total) by mouth every 6 (six) hours as needed. What changed: reasons to take this   fluticasone 50 MCG/ACT nasal spray Commonly known as: FLONASE Place 2 sprays into both nostrils daily.   hydrocortisone valerate cream 0.2 % Commonly known as: WESTCORT Apply 1 Application topically daily as needed (itching).   hyoscyamine 0.125 MG tablet Commonly  known as: Levsin Take 1 tablet (0.125 mg total) by mouth every 4 (four) hours as needed.   multivitamin with minerals tablet Take 2 tablets by mouth daily. Vitafusion woman gummy   NexIUM 40 MG capsule Generic drug: esomeprazole Take 1 capsule (40 mg total) by mouth daily before breakfast.   Pataday 0.1 % ophthalmic solution Generic drug: olopatadine Place 1 drop into the left eye 2 (two) times daily.   Prevagen 10 MG Caps Generic drug: Apoaequorin Take 10 mg by mouth daily. prevagen   Restasis 0.05 % ophthalmic emulsion Generic drug: cycloSPORINE Place 1 drop into both eyes 2 (two) times daily.   rosuvastatin 10 MG tablet Commonly known as: Crestor Take 1 tablet (10 mg total) by mouth at bedtime.   temazepam 30 MG capsule Commonly known as: RESTORIL Take 1 capsule (30 mg total) by mouth at bedtime as needed. for sleep   tirzepatide 5 MG/0.5ML Pen Commonly known as: MOUNJARO Inject 5 mg into the skin once a week.   triamterene-hydrochlorothiazide 37.5-25 MG tablet Commonly known as: MAXZIDE-25 Take 0.5 tablets by mouth daily.   Xarelto 2.5 MG Tabs tablet Generic drug: rivaroxaban TAKE TWO TABLETS BY MOUTH DAILY        Allergies:  Allergies  Allergen Reactions   Doxepin Nausea And Vomiting and Other (  See Comments)    "Feeling loopy"   Morphine And Codeine Anaphylaxis and Shortness Of Breath   Sulfonamide Derivatives Hives    Burn from inside out   Promethazine Hcl Other (See Comments)    Hallucinations    Past Medical History, Surgical history, Social history, and Family History were reviewed and updated.  Review of Systems: All other 10 point review of systems is negative.   Physical Exam:  height is 5\' 8"  (1.727 m) and weight is 246 lb (111.6 kg). Her oral temperature is 98.6 F (37 C). Her blood pressure is 118/64 and her pulse is 75. Her respiration is 18 and oxygen saturation is 100%.   Wt Readings from Last 3 Encounters:  06/18/23 246 lb (111.6  kg)  06/08/23 244 lb 4 oz (110.8 kg)  05/28/23 243 lb (110.2 kg)    Ocular: Sclerae unicteric, pupils equal, round and reactive to light Ear-nose-throat: Oropharynx clear, dentition fair Lymphatic: No cervical or supraclavicular adenopathy Lungs no rales or rhonchi, good excursion bilaterally Heart regular rate and rhythm, no murmur appreciated Abd soft, nontender, positive bowel sounds MSK no focal spinal tenderness, no joint edema Neuro: non-focal, well-oriented, appropriate affect Breasts: Deferred   Lab Results  Component Value Date   WBC 4.5 06/18/2023   HGB 12.1 06/18/2023   HCT 37.1 06/18/2023   MCV 87.5 06/18/2023   PLT 209 06/18/2023   Lab Results  Component Value Date   FERRITIN 51 12/05/2021   IRON 59 12/05/2021   TIBC 323 12/05/2021   UIBC 264 12/05/2021   IRONPCTSAT 18 12/05/2021   Lab Results  Component Value Date   RETICCTPCT 1.5 12/05/2021   RBC 4.24 06/18/2023   No results found for: "KPAFRELGTCHN", "LAMBDASER", "KAPLAMBRATIO" No results found for: "IGGSERUM", "IGA", "IGMSERUM" No results found for: "TOTALPROTELP", "ALBUMINELP", "A1GS", "A2GS", "BETS", "BETA2SER", "GAMS", "MSPIKE", "SPEI"   Chemistry      Component Value Date/Time   NA 144 03/16/2023 1454   NA 143 12/12/2022 1100   NA 143 02/16/2017 1450   NA 142 01/19/2016 1141   K 4.1 03/16/2023 1454   K 3.7 02/16/2017 1450   K 3.7 01/19/2016 1141   CL 105 03/16/2023 1454   CL 105 02/16/2017 1450   CO2 32 03/16/2023 1454   CO2 32 02/16/2017 1450   CO2 27 01/19/2016 1141   BUN 20 03/16/2023 1454   BUN 17 12/12/2022 1100   BUN 14 02/16/2017 1450   BUN 19.5 01/19/2016 1141   CREATININE 1.06 (H) 03/16/2023 1454   CREATININE 1.2 02/16/2017 1450   CREATININE 0.9 01/19/2016 1141      Component Value Date/Time   CALCIUM 9.9 03/16/2023 1454   CALCIUM 8.8 02/16/2017 1450   CALCIUM 8.8 01/19/2016 1141   ALKPHOS 53 03/16/2023 1454   ALKPHOS 57 02/16/2017 1450   ALKPHOS 59 01/19/2016 1141    AST 18 03/16/2023 1454   AST 21 01/19/2016 1141   ALT 16 03/16/2023 1454   ALT 27 02/16/2017 1450   ALT 18 01/19/2016 1141   BILITOT 0.4 03/16/2023 1454   BILITOT 0.31 01/19/2016 1141       Impression and Plan: Jennifer [name] is a very pleasant 72 yo African American female with history of recurrent PE. Her initial thrombus was diagnosed in 2012 and then recurred in 2015. She is on lifelong anticoagulation with Xarelto 5 mg PO daily and tolerating well. \ No changes. D-dimer normal.  Follow-up in 4 months.   Jennifer Stanford, NP 2/24/20253:14 PM

## 2023-06-19 ENCOUNTER — Ambulatory Visit (HOSPITAL_BASED_OUTPATIENT_CLINIC_OR_DEPARTMENT_OTHER): Payer: Medicare Other | Attending: Orthopedic Surgery | Admitting: Physical Therapy

## 2023-06-19 ENCOUNTER — Other Ambulatory Visit: Payer: Self-pay

## 2023-06-19 ENCOUNTER — Encounter (HOSPITAL_BASED_OUTPATIENT_CLINIC_OR_DEPARTMENT_OTHER): Payer: Self-pay | Admitting: Physical Therapy

## 2023-06-19 DIAGNOSIS — R2689 Other abnormalities of gait and mobility: Secondary | ICD-10-CM | POA: Diagnosis not present

## 2023-06-19 DIAGNOSIS — M6281 Muscle weakness (generalized): Secondary | ICD-10-CM | POA: Insufficient documentation

## 2023-06-19 DIAGNOSIS — M5459 Other low back pain: Secondary | ICD-10-CM | POA: Diagnosis not present

## 2023-06-19 NOTE — Therapy (Signed)
 OUTPATIENT PHYSICAL THERAPY THORACOLUMBAR EVALUATION   Patient Name: Jennifer [name] MRN: [id number] DOB:21-Jan-1951, 73 y.o., female Today's Date: 06/19/2023  END OF SESSION:  PT End of Session - 06/19/23 1809     Visit Number 1    Number of Visits 14    Date for PT Re-Evaluation 08/04/23    Authorization Type medicare    Progress Note Due on Visit 10    PT Start Time 1536    PT Stop Time 1614    PT Time Calculation (min) 38 min    Activity Tolerance Patient tolerated treatment well    Behavior During Therapy [facility] for tasks assessed/performed             Past Medical History:  Diagnosis Date   Allergy    Anemia    Arthritis    Cataract    bil cataracts removed   Chronic chest pain    Chronic fatigue 02/03/2017   Clotting disorder (HCC)    Pulmonary Embolis 2010 & 2015   Colon polyps    inflamed partially ulcerated hyperplastic polyp   Diabetes (HCC) 09/24/2013   no meds   Diverticulosis    Dysphagia esophageal phase - chronic after 3 fundoplications 01/20/2014   Edema of both lower extremities    Esophageal stenosis    Stricture after fundoplication REDO   Fibromyalgia    GERD (gastroesophageal reflux disease)    Glaucoma suspect    Heart murmur    no problems    Hemorrhoids, internal, with bleeding 06/27/2017   History of hiatal hernia    Hypertension    IBS (irritable bowel syndrome)    Lactose intolerance    Mitral valve prolapse    OSA (obstructive sleep apnea)    on CPAP-Mild - Sleep study 2004   Pre-diabetes    Pulmonary embolus (HCC)    2010 and 12-2013   Pulmonary nodule 12/13/2022   seen on CT chest xray on 12/16/22 - left upper lobe   Sleep apnea    wears CPAP   Thyroid nodule      dr. watching    Tinnitus    Subjective   Zoster 2014   R flank   Past Surgical History:  Procedure Laterality Date   ABDOMINAL HYSTERECTOMY     still has 1 ovary    APPENDECTOMY     BRAIN SURGERY  1995   Stem surgery, Arnold Chiari Malformation   CATARACT  EXTRACTION Bilateral 2017   cataracts, Dr. Elmer Picker   CHOLECYSTECTOMY     COLONOSCOPY     ESOPHAGEAL DILATION N/A 02/06/2023   Procedure: ESOPHAGEAL DILATION;  Surgeon: Corliss Skains, MD;  Location: [facility] OR;  Service: Thoracic;  Laterality: N/A;   ESOPHAGEAL DILATION N/A 02/13/2023   Procedure: ESOPHAGEAL DILATION;  Surgeon: Corliss Skains, MD;  Location: [facility] OR;  Service: Thoracic;  Laterality: N/A;   ESOPHAGEAL DILATION N/A 03/12/2023   Procedure: ESOPHAGEAL DILATION;  Surgeon: Corliss Skains, MD;  Location: [facility] OR;  Service: Thoracic;  Laterality: N/A;   ESOPHAGEAL MANOMETRY N/A 08/06/2019   Procedure: ESOPHAGEAL MANOMETRY (EM);  Surgeon: Iva Boop, MD;  Location: [facility];  Service: Endoscopy;  Laterality: N/A;   ESOPHAGOGASTRODUODENOSCOPY N/A 02/06/2023   Procedure: ESOPHAGOGASTRODUODENOSCOPY (EGD);  Surgeon: Corliss Skains, MD;  Location: [facility] OR;  Service: Thoracic;  Laterality: N/A;  With Savary dilation   ESOPHAGOGASTRODUODENOSCOPY N/A 02/13/2023   Procedure: ESOPHAGOGASTRODUODENOSCOPY (EGD);  Surgeon: Corliss Skains, MD;  Location: [facility] OR;  Service: Thoracic;  Laterality: N/A;   ESOPHAGOGASTRODUODENOSCOPY N/A 03/12/2023   Procedure: ESOPHAGOGASTRODUODENOSCOPY (EGD);  Surgeon: Corliss Skains, MD;  [facility];  Service: Thoracic;  Laterality: N/A;   HEMORRHOID BANDING     HERNIA REPAIR     Hital Hernia   JOINT REPLACEMENT Right    knee   KNEE ARTHROSCOPY  05/16/2012   Procedure: ARTHROSCOPY KNEE;  Surgeon: Kennieth Rad, MD;  Location: [facility] OR;  Service: Orthopedics;  Laterality: Left;   KNEE ARTHROSCOPY Right 01/2018   NISSEN FUNDOPLICATION     X 3 lab, open x 2 last with mesh   POLYPECTOMY     REDUCTION MAMMAPLASTY Bilateral 2007   TOTAL KNEE ARTHROPLASTY Right 08/25/2019   Procedure: RIGHT TOTAL KNEE ARTHROPLASTY;  Surgeon: Ollen Gross, MD;  Location: [facility] ORS;  Service: Orthopedics;  Laterality: Right;    UPPER  GASTROINTESTINAL ENDOSCOPY     Patient Active Problem List   Diagnosis Date Noted   Vitamin D deficiency 12/12/2022   Decreased GFR 12/12/2022   Physically inactive 12/12/2022   Calculus of kidney 07/14/2021   OSA on CPAP 06/08/2021   Circadian rhythm sleep disorder, delayed sleep phase type 06/08/2021   Glaucoma suspect 06/25/2020   S/P total knee arthroplasty, right 08/27/2019   OA (osteoarthritis) of knee 08/25/2019   Primary osteoarthritis of right knee 08/25/2019   Atypical chest pain    Hemorrhoids, internal, with bleeding 06/27/2017   IDA (iron deficiency anemia) 02/20/2017   Chronic fatigue 02/03/2017   Left leg pain 11/29/2016   History of colonic polyps 12/02/2015   PCP NOTES >>>>>>>>>>>>>>>>>>>>>>>>> 02/13/2015   Anxiety 08/04/2014   Annual physical exam 04/29/2014   Fibromyalgia syndrome 02/04/2014   Dysphagia 01/20/2014   Chronic cough 01/08/2014   DOE (dyspnea on exertion) 12/26/2013   Diabetes (HCC) 09/24/2013   Abdominal pain, chronic, right flank -upper quadrant 07/02/2012   Class 2 severe obesity with serious comorbidity and body mass index (BMI) of 38.0 to 38.9 in adult [facility]) 09/30/2009   Hypersomnia with sleep apnea 09/28/2009   Essential hypertension 06/24/2009   DIZZINESS 06/24/2009   HEADACHE 06/24/2009   VENOUS INSUFFICIENCY, LEGS 11/19/2008   Acute pulmonary embolism (HCC) 05/19/2008   Pulmonary nodule 08/26/2007   Stricture and stenosis of esophagus 05/22/2007   GERD 01/31/2007   IBS (irritable bowel syndrome) 01/31/2007   MITRAL VALVE PROLAPSE, HX OF 01/31/2007    PCP: Willow Ora MD  REFERRING PROVIDER: Venita Lick, MD   REFERRING DIAG: M54.50 (ICD-10-CM) - Low back pain   Rationale for Evaluation and Treatment: Rehabilitation  THERAPY DIAG:  Other low back pain  Muscle weakness (generalized)  Other abnormalities of gait and mobility  ONSET DATE: Last 2 yrs  SUBJECTIVE:  SUBJECTIVE STATEMENT: Flare of LBP that has stayed for 3 weeks this time.  Thought it was my bed ??.  Using the pain patches every once in a while and they help. I wake up with pain. R TKR ~ 5 yrs ago.   PERTINENT HISTORY:   Lumbar spondylosis  PAIN:  Are you having pain? Yes: NPRS scale: current 4/10; worst 8/10 Pain location: LB Pain description: ache Aggravating factors: sitting >30 minutes, lying on left side or too long on back Relieving factors: heating pad, pain patches  PRECAUTIONS: None  RED FLAGS: None   WEIGHT BEARING RESTRICTIONS: No  FALLS:  Has patient fallen in last 6 months? No   OCCUPATION: retired; Clinical cytogeneticist  PLOF: Independent  PATIENT GOALS: improve strength, get back into exercise.  NEXT MD VISIT: as needed  OBJECTIVE:  Note: Objective measures were completed at Evaluation unless otherwise noted.  DIAGNOSTIC FINDINGS:  Imaging from today does demonstrate mild degenerative disc disease at L4-L5 and L5-S1   PATIENT SURVEYS:  Modified Oswestry 15/45   COGNITION: Overall cognitive status: Within functional limits for tasks assessed     SENSATION: WFL  MUSCLE LENGTH: Hamstrings: Bilateral hamstring    POSTURE: rounded shoulders, forward head, and left knee valgus  PALPATION: Center l4-5; L5-S1  LUMBAR ROM:  wfl  LOWER EXTREMITY ROM:     wfl  LOWER EXTREMITY MMT:    MMT Right eval Left eval  Hip flexion 40.6 37.1  Hip extension    Hip abduction 27.4 26.7  Hip adduction    Hip internal rotation    Hip external rotation    Knee flexion    Knee extension 47.1 29.3  Ankle dorsiflexion    Ankle plantarflexion    Ankle inversion    Ankle eversion     (Blank rows = not tested)  LUMBAR SPECIAL TESTS:  Slump test: Negative  FUNCTIONAL TESTS:  30 s STS 4 from pool bench with ue assist (norm 10-14  ) Timed up and go (TUG): 14.59 4 stage balance: passed 1&2; Tandem x 10s (unsteady); SLS x 6s  GAIT: Distance walked: 438ft Assistive device utilized: None Level of assistance: Complete Independence Comments: Left knee valgus knocking right.  TREATMENT  Eval Self care                                                                                                                              PATIENT EDUCATION:  Education details: Discussed eval findings, rehab rationale, aquatic program progression/POC and pools in area. Patient is in agreement  Person educated: Patient Education method: Explanation Education comprehension: verbalized understanding  HOME EXERCISE PROGRAM: tba  ASSESSMENT:  CLINICAL IMPRESSION: Patient is a 73 y.o. f who was seen today for physical therapy evaluation and treatment for LBP.  She presents today with moderate pain sensitivity in focal area midline spine at L4-5- s1.  Pain reportedly interferes in daily activities and sleeping at night occasionally. Pt is not very  active by own admission. Testing indicates strength deficits, she has difficulty rising from a seated position and some balance deficits evident mostly with transitional movements.  She will benefit from skilled PT intervention.  She does not and likely will not have pool access so we will begin with aquatic sessions for pain management and to initiate muscle lengthening/strengthening and balance then incorporate land based intervention for added load bearing strengthening and final HEP for continued management of chronic condition  OBJECTIVE IMPAIRMENTS: Abnormal gait, decreased activity tolerance, decreased balance, impaired flexibility, and pain.   ACTIVITY LIMITATIONS: sleeping, stairs, transfers, and locomotion level  PARTICIPATION LIMITATIONS: shopping and community activity  PERSONAL FACTORS: Fitness are also affecting patient's functional outcome.   REHAB POTENTIAL: Good  CLINICAL  DECISION MAKING: Evolving/moderate complexity  EVALUATION COMPLEXITY: Moderate   GOALS: Goals reviewed with patient? Yes  SHORT TERM GOALS: Target date: 07/07/23  Pt will tolerate full aquatic sessions consistently without increase in pain and with improving function to demonstrate good toleration and effectiveness of intervention.  Baseline: Goal status: INITIAL  2. Pt will report walking x 15 minutes 5/7 days a week to demonstrate committment to increasing her physical activity Baseline: not Goal Status: Initial  3.  Pt will report a reduction of LBP by at least 25% Baseline: see chart Goal status: INITIAL   LONG TERM GOALS: Target date: 08/04/23  Pt to improve on ODI by  20 % to demonstrate statistically significant Improvement in function. Baseline: 33% Goal status: INITIAL  2.  Pt will be indep with final HEP's (land and aquatic as appropriate) for continued management of condition Baseline:  Goal status: INITIAL  3.  Pt will report decrease in pain by at least 75% for improved toleration to activity/quality of life and to demonstrate improved management of pain.  Baseline: see chart Goal status: INITIAL  4.  Pt will improve strength in LE up towards 10lbs to demonstrate improved overall physical function Baseline: see chart Goal status: INITIAL  5.  Pt will improve on 30s STS test to <or= 8 to demonstrate improving functional lower extremity strength, transitional movements, and balance Baseline: 4 Goal status: INITIAL  6.  Pt will improve on Tug test to <or=13s (norm) to demonstrate improvement in lower extremity function, mobility and decreased fall risk. Baseline: 14.59 Goal status: INITIAL  PLAN:  PT FREQUENCY: 2x/week  PT DURATION: 6 weeks  PLANNED INTERVENTIONS: 97164- PT Re-evaluation, 97110-Therapeutic exercises, 97530- Therapeutic activity, 97112- Neuromuscular re-education, 97535- Self Care, 16109- Manual therapy, [phone]- Gait training, [phone]-  Orthotic Fit/training, [phone]- Aquatic Therapy, [phone]- Ionotophoresis 4mg /ml Dexamethasone, Patient/Family education, Balance training, Stair training, Taping, Dry Needling, Joint mobilization, Cryotherapy, and Moist heat.  PLAN FOR NEXT SESSION: aquatic sessions for pain management and to initiate muscle lengthening/strengthening and balance then incorporate land based intervention for added load bearing strengthening and final HEP for continued management of chronic condition   Corrie Dandy Tomma Lightning) Raford Brissett MPT 06/19/23 6:13 PM [facility] [address] [place], [place], [address] Phone: [phone]   Fax:  [phone]

## 2023-06-21 ENCOUNTER — Ambulatory Visit: Payer: Medicare Other | Admitting: "Endocrinology

## 2023-06-25 ENCOUNTER — Ambulatory Visit (INDEPENDENT_AMBULATORY_CARE_PROVIDER_SITE_OTHER): Payer: Medicare Other | Admitting: Internal Medicine

## 2023-06-25 ENCOUNTER — Encounter (INDEPENDENT_AMBULATORY_CARE_PROVIDER_SITE_OTHER): Payer: Self-pay | Admitting: Internal Medicine

## 2023-06-25 DIAGNOSIS — E66812 Obesity, class 2: Secondary | ICD-10-CM | POA: Diagnosis not present

## 2023-06-25 DIAGNOSIS — E1169 Type 2 diabetes mellitus with other specified complication: Secondary | ICD-10-CM | POA: Diagnosis not present

## 2023-06-25 DIAGNOSIS — Z6838 Body mass index (BMI) 38.0-38.9, adult: Secondary | ICD-10-CM

## 2023-06-25 DIAGNOSIS — Z7985 Long-term (current) use of injectable non-insulin antidiabetic drugs: Secondary | ICD-10-CM

## 2023-06-25 DIAGNOSIS — G4733 Obstructive sleep apnea (adult) (pediatric): Secondary | ICD-10-CM | POA: Diagnosis not present

## 2023-06-25 MED ORDER — TIRZEPATIDE 7.5 MG/0.5ML ~~LOC~~ SOAJ
7.5000 mg | SUBCUTANEOUS | 0 refills | Status: DC
Start: 2023-06-25 — End: 2023-07-26

## 2023-06-25 NOTE — Progress Notes (Unsigned)
 Office: [phone]  /  Fax: [phone]  Weight Summary And Biometrics  Vitals Temp: 98.1 F (36.7 C) BP: 128/80 Pulse Rate: 78 SpO2: 98 %   Anthropometric Measurements Height: 5\' 8"  (1.727 m) Weight: 239 lb (108.4 kg) BMI (Calculated): 36.35 Weight at Last Visit: 243 lb Weight Lost Since Last Visit: 4 lb Weight Gained Since Last Visit: 0 lb Starting Weight: 256 lb Total Weight Loss (lbs): 17 lb (7.711 kg) Peak Weight: 259 lb   Body Composition  Body Fat %: 46.8 % Fat Mass (lbs): 112 lbs Muscle Mass (lbs): 121 lbs Total Body Water (lbs): 83 lbs Visceral Fat Rating : 15    RMR: 1771  Today's Visit #: 8  Starting Date: 12/12/22   Subjective   Chief Complaint: Obesity  Interval History Discussed the use of AI scribe software for clinical note transcription with the patient, who gave verbal consent to proceed.  History of Present Illness   Jennifer [name] is a 73 year old female who presents for medical weight management.  She has lost four pounds since her last visit, adhering to a category two plan 70-80% of the time. She tracks her calories, consumes more whole foods, maintains hydration, and avoids skipping meals. She feels she is not consuming the right amount of protein. She experiences no feelings of being overly restricted or unsatisfied with her meals, although recent stress has led to some hunger. Her current weight is 239 pounds, down from 243 pounds at the last visit and 261 pounds previously, indicating a steady weight loss over time.  She has been experiencing personal stress due to her son being in intensive care last week with a saddle blood clot and another clot in his right leg. Despite this, she managed to lose weight, although her eating habits were disrupted during that time.  She exercises two days a week for about 30 minutes, focusing on cardio exercises. She used to work out intensely, which she believes contributed to her shoulder pain.  She has not returned to her personal trainer since injuring her knee four years ago but is considering lighter exercises such as chair yoga and low-impact cardio.  She reports sleeping about seven to nine hours per night and is not experiencing high levels of stress.  She is currently on Mounjaro and reports good appetite control with no adverse effects. Her recent A1c was 6.2, and she is working to bring it down further. She is mindful of maintaining adequate nutrition and hydration to support her weight loss and overall health.  She has a history of a knee replacement and experiences arthritis in her lower back, which occasionally flares up. Water aerobics is recommended for managing arthritis symptoms, and she reports improvement in her back pain.       Challenges affecting patient progress: medical comorbidities and menopause.    Pharmacotherapy for weight management: She is currently taking Monjauro with diabetes as the primary indication with adequate clinical response  and without side effects..   Assessment and Plan   Treatment Plan For Obesity:  Recommended Dietary Goals  Vaniah is currently in the action stage of change. As such, her goal is to continue weight management plan. She has agreed to: continue current plan  Behavioral Health and Counseling  We discussed the following behavioral modification strategies today: continue to work on maintaining a reduced calorie state, getting the recommended amount of protein, incorporating whole foods, making healthy choices, staying well hydrated and practicing mindfulness when eating.Marland Kitchen  Additional education and resources provided today: None  Recommended Physical Activity Goals  Cleaster has been advised to work up to 150 minutes of moderate intensity aerobic activity a week and strengthening exercises 2-3 times per week for cardiovascular health, weight loss maintenance and preservation of muscle mass.   She has agreed to :  Think  about enjoyable ways to increase daily physical activity and overcoming barriers to exercise and Increase physical activity in their day and reduce sedentary time (increase NEAT).  Pharmacotherapy  We discussed various medication options to help Kaarin with her weight loss efforts and we both agreed to : increase Mounjaro to 7.5 mg once a week with the primary indication of type 2 diabetes secondary weight management  Associated Conditions Impacted by Obesity Treatment  OSA on CPAP Assessment & Plan: On CPAP with reported good compliance. Continue PAP therapy. Losing 15% or more of body weight may improve AHI.  Patient will continue GLP-1 therapy for diabetes management as well as assist with her obesity and associated comorbid conditions.   Orders: -     Tirzepatide; Inject 7.5 mg into the skin once a week.  Dispense: 2 mL; Refill: 0  Type 2 diabetes mellitus with other specified complication, without long-term current use of insulin (HCC) Assessment & Plan: Recent A1c 6.2%, within target range. Good response to 5 mg Mounjaro. Discussed increasing dose to 7.5 mg to aid weight loss and glycemic control, with option to revert if nausea occurs. - Increase Mounjaro dose to 7.5 mg - Monitor for side effects such as nausea and upset stomach - Ensure adequate hydration and fiber intake to prevent constipation - Follow up in 4 weeks  Orders: -     Tirzepatide; Inject 7.5 mg into the skin once a week.  Dispense: 2 mL; Refill: 0  Class 2 severe obesity with serious comorbidity and body mass index (BMI) of 38.0 to 38.9 in adult, unspecified obesity type [facility]) Assessment & Plan: Lost four pounds since last visit, following category two plan 70-80% of the time. Tracking calories, eating more whole foods, maintaining hydration, and not skipping meals. Exercising twice a week for 30 minutes, primarily cardio. Sleeping 7-9 hours per night, low stress levels. Despite son's recent hospitalization,  continues weight loss journey at a healthy pace of one pound per week. Discussed increasing Mounjaro dose to 7.5 mg to aid weight loss and diabetes management, with option to revert if nausea occurs. - Continue current dietary plan - Increase exercise frequency and incorporate low-impact exercises such as chair yoga and upright cardio - Increase Mounjaro dose to 7.5 mg - Ensure adequate hydration and fiber intake to prevent constipation - Follow up in 4 weeks  Orders: -     Tirzepatide; Inject 7.5 mg into the skin once a week.  Dispense: 2 mL; Refill: 0     General Health Maintenance Advised to maintain a healthy lifestyle, including adequate nutrition, hydration, and exercise. Emphasized importance of low-impact exercises to protect joints and maintain muscle mass. - Incorporate low-impact exercises such as chair yoga and upright cardio - Ensure adequate hydration and fiber intake - Monitor for any new symptoms or changes in health status  Follow-up - Follow up in 4 weeks.       Objective   Physical Exam:  Blood pressure 128/80, pulse 78, temperature 98.1 F (36.7 C), height 5\' 8"  (1.727 m), weight 239 lb (108.4 kg), SpO2 98%. Body mass index is 36.34 kg/m.  General: She is overweight, cooperative, alert, well  developed, and in no acute distress. PSYCH: Has normal mood, affect and thought process.   HEENT: EOMI, sclerae are anicteric. Lungs: Normal breathing effort, no conversational dyspnea. Extremities: No edema.  Neurologic: No gross sensory or motor deficits. No tremors or fasciculations noted.    Diagnostic Data Reviewed:  BMET    Component Value Date/Time   NA 142 06/18/2023 1437   NA 143 12/12/2022 1100   NA 143 02/16/2017 1450   NA 142 01/19/2016 1141   K 4.1 06/18/2023 1437   K 3.7 02/16/2017 1450   K 3.7 01/19/2016 1141   CL 104 06/18/2023 1437   CL 105 02/16/2017 1450   CO2 31 06/18/2023 1437   CO2 32 02/16/2017 1450   CO2 27 01/19/2016 1141    GLUCOSE 85 06/18/2023 1437   GLUCOSE 88 02/16/2017 1450   BUN 20 06/18/2023 1437   BUN 17 12/12/2022 1100   BUN 14 02/16/2017 1450   BUN 19.5 01/19/2016 1141   CREATININE 0.98 06/18/2023 1437   CREATININE 1.2 02/16/2017 1450   CREATININE 0.9 01/19/2016 1141   CALCIUM 9.5 06/18/2023 1437   CALCIUM 8.8 02/16/2017 1450   CALCIUM 8.8 01/19/2016 1141   GFRNONAA >60 06/18/2023 1437   GFRAA >60 12/25/2019 1353   Lab Results  Component Value Date   HGBA1C 6.2 (H) 06/08/2023   HGBA1C 6.2 (H) 10/14/2007   Lab Results  Component Value Date   INSULIN 20.4 12/12/2022   INSULIN 12.6 05/19/2021   Lab Results  Component Value Date   TSH 4.110 12/12/2022   CBC    Component Value Date/Time   WBC 4.5 06/18/2023 1437   WBC 4.5 03/12/2023 0721   RBC 4.24 06/18/2023 1437   HGB 12.1 06/18/2023 1437   HGB 12.1 02/16/2017 1450   HCT 37.1 06/18/2023 1437   HCT 36.6 02/16/2017 1450   PLT 209 06/18/2023 1437   PLT 239 02/16/2017 1450   MCV 87.5 06/18/2023 1437   MCV 87 02/16/2017 1450   MCH 28.5 06/18/2023 1437   MCHC 32.6 06/18/2023 1437   RDW 15.0 06/18/2023 1437   RDW 15.4 02/16/2017 1450   Iron Studies    Component Value Date/Time   IRON 59 12/05/2021 1451   IRON 49 02/16/2017 1536   TIBC 323 12/05/2021 1451   TIBC 284 02/16/2017 1536   FERRITIN 51 12/05/2021 1452   FERRITIN 66 02/16/2017 1536   IRONPCTSAT 18 12/05/2021 1451   IRONPCTSAT 17 (L) 02/16/2017 1536   Lipid Panel     Component Value Date/Time   CHOL 115 06/08/2023 1422   TRIG 64 06/08/2023 1422   HDL 67 06/08/2023 1422   CHOLHDL 1.7 06/08/2023 1422   VLDL 9.8 06/05/2022 1320   LDLCALC 34 06/08/2023 1422   Hepatic Function Panel     Component Value Date/Time   PROT 7.2 06/18/2023 1437   PROT 7.1 12/12/2022 1100   PROT 7.3 02/16/2017 1450   PROT 7.3 01/19/2016 1141   ALBUMIN 4.1 06/18/2023 1437   ALBUMIN 4.3 12/12/2022 1100   ALBUMIN 3.6 01/19/2016 1141   AST 26 06/18/2023 1437   AST 21 01/19/2016  1141   ALT 32 06/18/2023 1437   ALT 27 02/16/2017 1450   ALT 18 01/19/2016 1141   ALKPHOS 51 06/18/2023 1437   ALKPHOS 57 02/16/2017 1450   ALKPHOS 59 01/19/2016 1141   BILITOT 0.4 06/18/2023 1437   BILITOT 0.31 01/19/2016 1141   BILIDIR 0.0 01/08/2012 1224   IBILI 0.2 12/30/2010 1515  Component Value Date/Time   TSH 4.110 12/12/2022 1100   Nutritional Lab Results  Component Value Date   VD25OH 61.5 12/12/2022   VD25OH 42.2 05/19/2021    Medications: Outpatient Encounter Medications as of 06/25/2023  Medication Sig Note   acetaminophen (TYLENOL) 500 MG tablet Take 1 tablet (500 mg total) by mouth every 6 (six) hours as needed. (Patient taking differently: Take 500 mg by mouth every 6 (six) hours as needed for moderate pain (pain score 4-6), mild pain (pain score 1-3) or headache.)    Apoaequorin (PREVAGEN) 10 MG CAPS Take 10 mg by mouth daily. prevagen    cycloSPORINE (RESTASIS) 0.05 % ophthalmic emulsion Place 1 drop into both eyes 2 (two) times daily.    fluticasone (FLONASE) 50 MCG/ACT nasal spray Place 2 sprays into both nostrils daily.    hydrocortisone valerate cream (WESTCORT) 0.2 % Apply 1 Application topically daily as needed (itching).    hyoscyamine (LEVSIN) 0.125 MG tablet Take 1 tablet (0.125 mg total) by mouth every 4 (four) hours as needed.    Multiple Vitamins-Minerals (MULTIVITAMIN WITH MINERALS) tablet Take 2 tablets by mouth daily. Vitafusion woman gummy    NEXIUM 40 MG capsule Take 1 capsule (40 mg total) by mouth daily before breakfast.    olopatadine (PATADAY) 0.1 % ophthalmic solution Place 1 drop into the left eye 2 (two) times daily.    rosuvastatin (CRESTOR) 10 MG tablet Take 1 tablet (10 mg total) by mouth at bedtime.    temazepam (RESTORIL) 30 MG capsule Take 1 capsule (30 mg total) by mouth at bedtime as needed. for sleep 06/08/2023: Has not been able to get yet- awaiting appeal   tirzepatide [facility]) 7.5 MG/0.5ML Pen Inject 7.5 mg into the skin  once a week.    triamterene-hydrochlorothiazide (MAXZIDE-25) 37.5-25 MG tablet Take 0.5 tablets by mouth daily.    XARELTO 2.5 MG TABS tablet TAKE TWO TABLETS BY MOUTH DAILY    [DISCONTINUED] tirzepatide (MOUNJARO) 5 MG/0.5ML Pen Inject 5 mg into the skin once a week.    No facility-administered encounter medications on file as of 06/25/2023.     Follow-Up   Return in about 4 weeks (around 07/23/2023) for For Weight Mangement with Dr. Rikki Spearing.Marland Kitchen She was informed of the importance of frequent follow up visits to maximize her success with intensive lifestyle modifications for her multiple health conditions.  Attestation Statement   Reviewed by clinician on day of visit: allergies, medications, problem list, medical history, surgical history, family history, social history, and previous encounter notes.     Worthy Rancher, MD

## 2023-06-26 NOTE — Assessment & Plan Note (Signed)
 Lost four pounds since last visit, following category two plan 70-80% of the time. Tracking calories, eating more whole foods, maintaining hydration, and not skipping meals. Exercising twice a week for 30 minutes, primarily cardio. Sleeping 7-9 hours per night, low stress levels. Despite son's recent hospitalization, continues weight loss journey at a healthy pace of one pound per week. Discussed increasing Mounjaro dose to 7.5 mg to aid weight loss and diabetes management, with option to revert if nausea occurs. - Continue current dietary plan - Increase exercise frequency and incorporate low-impact exercises such as chair yoga and upright cardio - Increase Mounjaro dose to 7.5 mg - Ensure adequate hydration and fiber intake to prevent constipation - Follow up in 4 weeks

## 2023-06-26 NOTE — Assessment & Plan Note (Signed)
 Recent A1c 6.2%, within target range. Good response to 5 mg Mounjaro. Discussed increasing dose to 7.5 mg to aid weight loss and glycemic control, with option to revert if nausea occurs. - Increase Mounjaro dose to 7.5 mg - Monitor for side effects such as nausea and upset stomach - Ensure adequate hydration and fiber intake to prevent constipation - Follow up in 4 weeks

## 2023-06-26 NOTE — Assessment & Plan Note (Addendum)
 On CPAP with reported good compliance. Continue PAP therapy. Losing 15% or more of body weight may improve AHI.  Patient will continue GLP-1 therapy for diabetes management as well as assist with her obesity and associated comorbid conditions.

## 2023-06-27 ENCOUNTER — Encounter (HOSPITAL_BASED_OUTPATIENT_CLINIC_OR_DEPARTMENT_OTHER): Payer: Self-pay | Admitting: Physical Therapy

## 2023-06-27 ENCOUNTER — Ambulatory Visit (HOSPITAL_BASED_OUTPATIENT_CLINIC_OR_DEPARTMENT_OTHER): Payer: Medicare Other | Attending: Orthopedic Surgery | Admitting: Physical Therapy

## 2023-06-27 DIAGNOSIS — M6281 Muscle weakness (generalized): Secondary | ICD-10-CM | POA: Diagnosis not present

## 2023-06-27 DIAGNOSIS — R2689 Other abnormalities of gait and mobility: Secondary | ICD-10-CM | POA: Diagnosis not present

## 2023-06-27 DIAGNOSIS — M5459 Other low back pain: Secondary | ICD-10-CM | POA: Insufficient documentation

## 2023-06-27 NOTE — Therapy (Signed)
 OUTPATIENT PHYSICAL THERAPY THORACOLUMBAR EVALUATION   Patient Name: Jennifer [name] MRN: [id number] DOB:1950/10/19, 73 y.o., female Today's Date: 06/27/2023  END OF SESSION:  PT End of Session - 06/27/23 1622     Visit Number 2    Number of Visits 14    Date for PT Re-Evaluation 08/04/23    Authorization Type medicare    Progress Note Due on Visit 10    PT Start Time 1622    PT Stop Time 1710    PT Time Calculation (min) 48 min    Activity Tolerance Patient tolerated treatment well    Behavior During Therapy Jennifer [name] for tasks assessed/performed             Past Medical History:  Diagnosis Date   Allergy    Anemia    Arthritis    Cataract    bil cataracts removed   Chronic chest pain    Chronic fatigue 02/03/2017   Clotting disorder (HCC)    Pulmonary Embolis 2010 & 2015   Colon polyps    inflamed partially ulcerated hyperplastic polyp   Diabetes (HCC) 09/24/2013   no meds   Diverticulosis    Dysphagia esophageal phase - chronic after 3 fundoplications 01/20/2014   Edema of both lower extremities    Esophageal stenosis    Stricture after fundoplication REDO   Fibromyalgia    GERD (gastroesophageal reflux disease)    Glaucoma suspect    Heart murmur    no problems    Hemorrhoids, internal, with bleeding 06/27/2017   History of hiatal hernia    Hypertension    IBS (irritable bowel syndrome)    Lactose intolerance    Mitral valve prolapse    OSA (obstructive sleep apnea)    on CPAP-Mild - Sleep study 2004   Pre-diabetes    Pulmonary embolus (HCC)    2010 and 12-2013   Pulmonary nodule 12/13/2022   seen on CT chest xray on 12/16/22 - left upper lobe   Sleep apnea    wears CPAP   Thyroid nodule      dr. watching    Tinnitus    Subjective   Zoster 2014   R flank   Past Surgical History:  Procedure Laterality Date   ABDOMINAL HYSTERECTOMY     still has 1 ovary    APPENDECTOMY     BRAIN SURGERY  1995   Stem surgery, Arnold Chiari Malformation   CATARACT  EXTRACTION Bilateral 2017   cataracts, Dr. Elmer Picker   CHOLECYSTECTOMY     COLONOSCOPY     ESOPHAGEAL DILATION N/A 02/06/2023   Procedure: ESOPHAGEAL DILATION;  Surgeon: Corliss Skains, MD;  Location: [facility] OR;  Service: Thoracic;  Laterality: N/A;   ESOPHAGEAL DILATION N/A 02/13/2023   Procedure: ESOPHAGEAL DILATION;  Surgeon: Corliss Skains, MD;  Location: [facility] OR;  Service: Thoracic;  Laterality: N/A;   ESOPHAGEAL DILATION N/A 03/12/2023   Procedure: ESOPHAGEAL DILATION;  Surgeon: Corliss Skains, MD;  Location: [facility] OR;  Service: Thoracic;  Laterality: N/A;   ESOPHAGEAL MANOMETRY N/A 08/06/2019   Procedure: ESOPHAGEAL MANOMETRY (EM);  Surgeon: Iva Boop, MD;  Location: [facility];  Service: Endoscopy;  Laterality: N/A;   ESOPHAGOGASTRODUODENOSCOPY N/A 02/06/2023   Procedure: ESOPHAGOGASTRODUODENOSCOPY (EGD);  Surgeon: Corliss Skains, MD;  Location: [facility] OR;  Service: Thoracic;  Laterality: N/A;  With Savary dilation   ESOPHAGOGASTRODUODENOSCOPY N/A 02/13/2023   Procedure: ESOPHAGOGASTRODUODENOSCOPY (EGD);  Surgeon: Corliss Skains, MD;  Location: [facility] OR;  Service: Thoracic;  Laterality: N/A;   ESOPHAGOGASTRODUODENOSCOPY N/A 03/12/2023   Procedure: ESOPHAGOGASTRODUODENOSCOPY (EGD);  Surgeon: Corliss Skains, MD;  Location: [facility] OR;  Service: Thoracic;  Laterality: N/A;   HEMORRHOID BANDING     HERNIA REPAIR     Hital Hernia   JOINT REPLACEMENT Right    knee   KNEE ARTHROSCOPY  05/16/2012   Procedure: ARTHROSCOPY KNEE;  Surgeon: Kennieth Rad, MD;  Location: [facility] OR;  Service: Orthopedics;  Laterality: Left;   KNEE ARTHROSCOPY Right 01/2018   NISSEN FUNDOPLICATION     X 3 lab, open x 2 last with mesh   POLYPECTOMY     REDUCTION MAMMAPLASTY Bilateral 2007   TOTAL KNEE ARTHROPLASTY Right 08/25/2019   Procedure: RIGHT TOTAL KNEE ARTHROPLASTY;  Surgeon: Ollen Gross, MD;  Location: [facility] ORS;  Service: Orthopedics;  Laterality: Right;    UPPER  GASTROINTESTINAL ENDOSCOPY     Patient Active Problem List   Diagnosis Date Noted   Vitamin D deficiency 12/12/2022   Decreased GFR 12/12/2022   Physically inactive 12/12/2022   Calculus of kidney 07/14/2021   OSA on CPAP 06/08/2021   Circadian rhythm sleep disorder, delayed sleep phase type 06/08/2021   Glaucoma suspect 06/25/2020   S/P total knee arthroplasty, right 08/27/2019   OA (osteoarthritis) of knee 08/25/2019   Primary osteoarthritis of right knee 08/25/2019   Atypical chest pain    Hemorrhoids, internal, with bleeding 06/27/2017   IDA (iron deficiency anemia) 02/20/2017   Chronic fatigue 02/03/2017   Left leg pain 11/29/2016   History of colonic polyps 12/02/2015   PCP NOTES >>>>>>>>>>>>>>>>>>>>>>>>> 02/13/2015   Anxiety 08/04/2014   Annual physical exam 04/29/2014   Fibromyalgia syndrome 02/04/2014   Dysphagia 01/20/2014   Chronic cough 01/08/2014   DOE (dyspnea on exertion) 12/26/2013   Diabetes (HCC) 09/24/2013   Abdominal pain, chronic, right flank -upper quadrant 07/02/2012   Class 2 severe obesity with serious comorbidity and body mass index (BMI) of 38.0 to 38.9 in adult [facility]) 09/30/2009   Hypersomnia with sleep apnea 09/28/2009   Essential hypertension 06/24/2009   DIZZINESS 06/24/2009   HEADACHE 06/24/2009   VENOUS INSUFFICIENCY, LEGS 11/19/2008   Acute pulmonary embolism (HCC) 05/19/2008   Pulmonary nodule 08/26/2007   Stricture and stenosis of esophagus 05/22/2007   GERD 01/31/2007   IBS (irritable bowel syndrome) 01/31/2007   MITRAL VALVE PROLAPSE, HX OF 01/31/2007    PCP: Willow Ora MD  REFERRING PROVIDER: Venita Lick, MD   REFERRING DIAG: M54.50 (ICD-10-CM) - Low back pain   Rationale for Evaluation and Treatment: Rehabilitation  THERAPY DIAG:  Other low back pain  Muscle weakness (generalized)  Other abnormalities of gait and mobility  ONSET DATE: Last 2 yrs  SUBJECTIVE:  SUBJECTIVE STATEMENT: Ready to start   Initial subjective Flare of LBP that has stayed for 3 weeks this time.  Thought it was my bed ??.  Using the pain patches every once in a while and they help. I wake up with pain. R TKR ~ 5 yrs ago.   PERTINENT HISTORY:   Lumbar spondylosis  PAIN:  Are you having pain? Yes: NPRS scale: current 5/10; worst 8/10 Pain location: LB Pain description: ache Aggravating factors: sitting >30 minutes, lying on left side or too long on back Relieving factors: heating pad, pain patches  PRECAUTIONS: None  RED FLAGS: None   WEIGHT BEARING RESTRICTIONS: No  FALLS:  Has patient fallen in last 6 months? No   OCCUPATION: retired; Clinical cytogeneticist  PLOF: Independent  PATIENT GOALS: improve strength, get back into exercise.  NEXT MD VISIT: as needed  OBJECTIVE:  Note: Objective measures were completed at Evaluation unless otherwise noted.  DIAGNOSTIC FINDINGS:  Imaging from today does demonstrate mild degenerative disc disease at L4-L5 and L5-S1   PATIENT SURVEYS:  Modified Oswestry 15/45   COGNITION: Overall cognitive status: Within functional limits for tasks assessed     SENSATION: WFL  MUSCLE LENGTH: Hamstrings: Bilateral hamstring    POSTURE: rounded shoulders, forward head, and left knee valgus  PALPATION: Center l4-5; L5-S1  LUMBAR ROM:  wfl  LOWER EXTREMITY ROM:     wfl  LOWER EXTREMITY MMT:    MMT Right eval Left eval  Hip flexion 40.6 37.1  Hip extension    Hip abduction 27.4 26.7  Hip adduction    Hip internal rotation    Hip external rotation    Knee flexion    Knee extension 47.1 29.3  Ankle dorsiflexion    Ankle plantarflexion    Ankle inversion    Ankle eversion     (Blank rows = not tested)  LUMBAR SPECIAL TESTS:  Slump test: Negative  FUNCTIONAL TESTS:  30 s STS 4 from pool  bench with ue assist (norm 10-14 ) Timed up and go (TUG): 14.59 4 stage balance: passed 1&2; Tandem x 10s (unsteady); SLS x 6s  GAIT: Distance walked: 43ft Assistive device utilized: None Level of assistance: Complete Independence Comments: Left knee valgus knocking right.  TREATMENT  [facility] Adult PT Treatment:                                                DATE: 06/27/23 Pt seen for aquatic therapy today.  Treatment took place in water 3.5-4.75 ft in depth at the [facility] pool. Temp of water was 91.  Pt entered/exited the pool via stairs with hand rail.  *intro to setting *walking forward, back and side ways 3.6 through 4.8 *L stretch *3 way hamstring stretch using solid noodle *quad stretch using noodle (R quad tighter than Left) *Farmers carry RB->yellow HB bilaterally then unilaterally *solid noodle pull down->yellow noodle wide stance then staggered x 8-10 *decompression noodle wrapped posteriorly: cycling *Straddled noodle ue support corner wall:hip add/abd   Pt requires the buoyancy and hydrostatic pressure of water for support, and to offload joints by unweighting joint load by at least 50 % in navel deep water and by at least 75-80% in chest to neck deep water.  Viscosity of the water is needed for resistance of strengthening. Water current perturbations provides challenge to standing balance requiring increased core  activation.                                                                                                                           PATIENT EDUCATION:  Education details: Discussed eval findings, rehab rationale, aquatic program progression/POC and pools in area. Patient is in agreement  Person educated: Patient Education method: Explanation Education comprehension: verbalized understanding  HOME EXERCISE PROGRAM: tba  ASSESSMENT:  CLINICAL IMPRESSION: Pt demonstrates safety and independence in aquatic setting with therapist instructing  from deck. She is confident and moves throughout all depths easily.  Pt is directed through various movement patterns and trials in both sitting and standing positions. She is challenged some with gaining COB to maintain decompression position. She reports reduction of LBP throughout session.  She does demonstrate increased left quad tightness vs right.  She is a good candidate for aquatic intervention and will benefit from the properties of water to progress towards functional goals.     Initial impression Patient is a 73 y.o. f who was seen today for physical therapy evaluation and treatment for LBP.  She presents today with moderate pain sensitivity in focal area midline spine at L4-5- s1.  Pain reportedly interferes in daily activities and sleeping at night occasionally. Pt is not very active by own admission. Testing indicates strength deficits, she has difficulty rising from a seated position and some balance deficits evident mostly with transitional movements.  She will benefit from skilled PT intervention.  She does not and likely will not have pool access so we will begin with aquatic sessions for pain management and to initiate muscle lengthening/strengthening and balance then incorporate land based intervention for added load bearing strengthening and final HEP for continued management of chronic condition  OBJECTIVE IMPAIRMENTS: Abnormal gait, decreased activity tolerance, decreased balance, impaired flexibility, and pain.   ACTIVITY LIMITATIONS: sleeping, stairs, transfers, and locomotion level  PARTICIPATION LIMITATIONS: shopping and community activity  PERSONAL FACTORS: Fitness are also affecting patient's functional outcome.   REHAB POTENTIAL: Good  CLINICAL DECISION MAKING: Evolving/moderate complexity  EVALUATION COMPLEXITY: Moderate   GOALS: Goals reviewed with patient? Yes  SHORT TERM GOALS: Target date: 07/07/23  Pt will tolerate full aquatic sessions consistently  without increase in pain and with improving function to demonstrate good toleration and effectiveness of intervention.  Baseline: Goal status: INITIAL  2. Pt will report walking x 15 minutes 5/7 days a week to demonstrate committment to increasing her physical activity Baseline: not Goal Status: Initial  3.  Pt will report a reduction of LBP by at least 25% Baseline: see chart Goal status: INITIAL   LONG TERM GOALS: Target date: 08/04/23  Pt to improve on ODI by  20 % to demonstrate statistically significant Improvement in function. Baseline: 33% Goal status: INITIAL  2.  Pt will be indep with final HEP's (land and aquatic as appropriate) for continued management of condition Baseline:  Goal status: INITIAL  3.  Pt will report decrease in  pain by at least 75% for improved toleration to activity/quality of life and to demonstrate improved management of pain.  Baseline: see chart Goal status: INITIAL  4.  Pt will improve strength in LE up towards 10lbs to demonstrate improved overall physical function Baseline: see chart Goal status: INITIAL  5.  Pt will improve on 30s STS test to <or= 8 to demonstrate improving functional lower extremity strength, transitional movements, and balance Baseline: 4 Goal status: INITIAL  6.  Pt will improve on Tug test to <or=13s (norm) to demonstrate improvement in lower extremity function, mobility and decreased fall risk. Baseline: 14.59 Goal status: INITIAL  PLAN:  PT FREQUENCY: 2x/week  PT DURATION: 6 weeks  PLANNED INTERVENTIONS: 97164- PT Re-evaluation, 97110-Therapeutic exercises, 97530- Therapeutic activity, 97112- Neuromuscular re-education, 97535- Self Care, 95284- Manual therapy, [phone]- Gait training, [phone]- Orthotic Fit/training, [phone]- Aquatic Therapy, [phone]- Ionotophoresis 4mg /ml Dexamethasone, Patient/Family education, Balance training, Stair training, Taping, Dry Needling, Joint mobilization, Cryotherapy, and Moist heat.  PLAN  FOR NEXT SESSION: aquatic sessions for pain management and to initiate muscle lengthening/strengthening and balance then incorporate land based intervention for added load bearing strengthening and final HEP for continued management of chronic condition   Jennifer [name] MPT 06/27/23 5:19 PM [name] [address] [place], [place], [address] Phone: [phone]   Fax:  [phone]

## 2023-06-28 ENCOUNTER — Other Ambulatory Visit (HOSPITAL_COMMUNITY): Payer: Self-pay

## 2023-06-28 ENCOUNTER — Other Ambulatory Visit: Payer: Self-pay | Admitting: Hematology & Oncology

## 2023-06-29 ENCOUNTER — Ambulatory Visit (HOSPITAL_BASED_OUTPATIENT_CLINIC_OR_DEPARTMENT_OTHER): Payer: Medicare Other | Admitting: Physical Therapy

## 2023-06-29 ENCOUNTER — Encounter (HOSPITAL_BASED_OUTPATIENT_CLINIC_OR_DEPARTMENT_OTHER): Payer: Self-pay | Admitting: Physical Therapy

## 2023-06-29 DIAGNOSIS — R2689 Other abnormalities of gait and mobility: Secondary | ICD-10-CM | POA: Diagnosis not present

## 2023-06-29 DIAGNOSIS — M5459 Other low back pain: Secondary | ICD-10-CM

## 2023-06-29 DIAGNOSIS — M6281 Muscle weakness (generalized): Secondary | ICD-10-CM

## 2023-06-29 NOTE — Therapy (Signed)
 OUTPATIENT PHYSICAL THERAPY THORACOLUMBAR TREATMENT   Patient Name: Jennifer [name] MRN: [id number] DOB:1951/01/06, 73 y.o., female Today's Date: 06/29/2023  END OF SESSION:  PT End of Session - 06/29/23 1522     Visit Number 3    Number of Visits 14    Date for PT Re-Evaluation 08/04/23    Authorization Type medicare    Progress Note Due on Visit 10    PT Start Time 1515    PT Stop Time 1553    PT Time Calculation (min) 38 min    Activity Tolerance Patient tolerated treatment well    Behavior During Therapy [facility] for tasks assessed/performed             Past Medical History:  Diagnosis Date   Allergy    Anemia    Arthritis    Cataract    bil cataracts removed   Chronic chest pain    Chronic fatigue 02/03/2017   Clotting disorder (HCC)    Pulmonary Embolis 2010 & 2015   Colon polyps    inflamed partially ulcerated hyperplastic polyp   Diabetes (HCC) 09/24/2013   no meds   Diverticulosis    Dysphagia esophageal phase - chronic after 3 fundoplications 01/20/2014   Edema of both lower extremities    Esophageal stenosis    Stricture after fundoplication REDO   Fibromyalgia    GERD (gastroesophageal reflux disease)    Glaucoma suspect    Heart murmur    no problems    Hemorrhoids, internal, with bleeding 06/27/2017   History of hiatal hernia    Hypertension    IBS (irritable bowel syndrome)    Lactose intolerance    Mitral valve prolapse    OSA (obstructive sleep apnea)    on CPAP-Mild - Sleep study 2004   Pre-diabetes    Pulmonary embolus (HCC)    2010 and 12-2013   Pulmonary nodule 12/13/2022   seen on CT chest xray on 12/16/22 - left upper lobe   Sleep apnea    wears CPAP   Thyroid nodule      dr. watching    Tinnitus    Subjective   Zoster 2014   R flank   Past Surgical History:  Procedure Laterality Date   ABDOMINAL HYSTERECTOMY     still has 1 ovary    APPENDECTOMY     BRAIN SURGERY  1995   Stem surgery, Arnold Chiari Malformation   CATARACT  EXTRACTION Bilateral 2017   cataracts, Dr. Elmer Picker   CHOLECYSTECTOMY     COLONOSCOPY     ESOPHAGEAL DILATION N/A 02/06/2023   Procedure: ESOPHAGEAL DILATION;  Surgeon: Corliss Skains, MD;  Location: [facility] OR;  Service: Thoracic;  Laterality: N/A;   ESOPHAGEAL DILATION N/A 02/13/2023   Procedure: ESOPHAGEAL DILATION;  Surgeon: Corliss Skains, MD;  Location: [facility] OR;  Service: Thoracic;  Laterality: N/A;   ESOPHAGEAL DILATION N/A 03/12/2023   Procedure: ESOPHAGEAL DILATION;  Surgeon: Corliss Skains, MD;  Location: [facility] OR;  Service: Thoracic;  Laterality: N/A;   ESOPHAGEAL MANOMETRY N/A 08/06/2019   Procedure: ESOPHAGEAL MANOMETRY (EM);  Surgeon: Iva Boop, MD;  Location: [facility] ENDOSCOPY;  Service: Endoscopy;  Laterality: N/A;   ESOPHAGOGASTRODUODENOSCOPY N/A 02/06/2023   Procedure: ESOPHAGOGASTRODUODENOSCOPY (EGD);  Surgeon: Corliss Skains, MD;  Location: [facility] OR;  Service: Thoracic;  Laterality: N/A;  With Savary dilation   ESOPHAGOGASTRODUODENOSCOPY N/A 02/13/2023   Procedure: ESOPHAGOGASTRODUODENOSCOPY (EGD);  Surgeon: Corliss Skains, MD;  Location: [facility] OR;  Service: Thoracic;  Laterality: N/A;   ESOPHAGOGASTRODUODENOSCOPY N/A 03/12/2023   Procedure: ESOPHAGOGASTRODUODENOSCOPY (EGD);  Surgeon: Corliss Skains, MD;  Location: [facility] OR;  Service: Thoracic;  Laterality: N/A;   HEMORRHOID BANDING     HERNIA REPAIR     Hital Hernia   JOINT REPLACEMENT Right    knee   KNEE ARTHROSCOPY  05/16/2012   Procedure: ARTHROSCOPY KNEE;  Surgeon: Kennieth Rad, MD;  Location: [facility] OR;  Service: Orthopedics;  Laterality: Left;   KNEE ARTHROSCOPY Right 01/2018   NISSEN FUNDOPLICATION     X 3 lab, open x 2 last with mesh   POLYPECTOMY     REDUCTION MAMMAPLASTY Bilateral 2007   TOTAL KNEE ARTHROPLASTY Right 08/25/2019   Procedure: RIGHT TOTAL KNEE ARTHROPLASTY;  Surgeon: Ollen Gross, MD;  Location: [facility] ORS;  Service: Orthopedics;  Laterality: Right;    UPPER  GASTROINTESTINAL ENDOSCOPY     Patient Active Problem List   Diagnosis Date Noted   Vitamin D deficiency 12/12/2022   Decreased GFR 12/12/2022   Physically inactive 12/12/2022   Calculus of kidney 07/14/2021   OSA on CPAP 06/08/2021   Circadian rhythm sleep disorder, delayed sleep phase type 06/08/2021   Glaucoma suspect 06/25/2020   S/P total knee arthroplasty, right 08/27/2019   OA (osteoarthritis) of knee 08/25/2019   Primary osteoarthritis of right knee 08/25/2019   Atypical chest pain    Hemorrhoids, internal, with bleeding 06/27/2017   IDA (iron deficiency anemia) 02/20/2017   Chronic fatigue 02/03/2017   Left leg pain 11/29/2016   History of colonic polyps 12/02/2015   PCP NOTES >>>>>>>>>>>>>>>>>>>>>>>>> 02/13/2015   Anxiety 08/04/2014   Annual physical exam 04/29/2014   Fibromyalgia syndrome 02/04/2014   Dysphagia 01/20/2014   Chronic cough 01/08/2014   DOE (dyspnea on exertion) 12/26/2013   Diabetes (HCC) 09/24/2013   Abdominal pain, chronic, right flank -upper quadrant 07/02/2012   Class 2 severe obesity with serious comorbidity and body mass index (BMI) of 38.0 to 38.9 in adult [facility]) 09/30/2009   Hypersomnia with sleep apnea 09/28/2009   Essential hypertension 06/24/2009   DIZZINESS 06/24/2009   HEADACHE 06/24/2009   VENOUS INSUFFICIENCY, LEGS 11/19/2008   Acute pulmonary embolism (HCC) 05/19/2008   Pulmonary nodule 08/26/2007   Stricture and stenosis of esophagus 05/22/2007   GERD 01/31/2007   IBS (irritable bowel syndrome) 01/31/2007   MITRAL VALVE PROLAPSE, HX OF 01/31/2007    PCP: Willow Ora MD  REFERRING PROVIDER: Venita Lick, MD   REFERRING DIAG: M54.50 (ICD-10-CM) - Low back pain   Rationale for Evaluation and Treatment: Rehabilitation  THERAPY DIAG:  Other low back pain  Muscle weakness (generalized)  Other abnormalities of gait and mobility  ONSET DATE: Last 2 yrs  SUBJECTIVE:  SUBJECTIVE STATEMENT: Pt reports she was sore after first session, but took epsom salt bath that relieved soreness.    Initial subjective Flare of LBP that has stayed for 3 weeks this time.  Thought it was my bed ??.  Using the pain patches every once in a while and they help. I wake up with pain. R TKR ~ 5 yrs ago.   PERTINENT HISTORY:   Lumbar spondylosis  PAIN:  Are you having pain? Yes: NPRS scale: current 4.5/10 Pain location: LB Pain description: ache Aggravating factors: sitting >30 minutes, lying on left side or too long on back Relieving factors: heating pad, pain patches  PRECAUTIONS: None  RED FLAGS: None   WEIGHT BEARING RESTRICTIONS: No  FALLS:  Has patient fallen in last 6 months? No   OCCUPATION: retired; Clinical cytogeneticist  PLOF: Independent  PATIENT GOALS: improve strength, get back into exercise.  NEXT MD VISIT: as needed  OBJECTIVE:  Note: Objective measures were completed at Evaluation unless otherwise noted.  DIAGNOSTIC FINDINGS:  Imaging from today does demonstrate mild degenerative disc disease at L4-L5 and L5-S1   PATIENT SURVEYS:  Modified Oswestry 15/45   COGNITION: Overall cognitive status: Within functional limits for tasks assessed     SENSATION: WFL  MUSCLE LENGTH: Hamstrings: Bilateral hamstring    POSTURE: rounded shoulders, forward head, and left knee valgus  PALPATION: Center l4-5; L5-S1  LUMBAR ROM:  wfl  LOWER EXTREMITY ROM:     wfl  LOWER EXTREMITY MMT:    MMT Right eval Left eval  Hip flexion 40.6 37.1  Hip extension    Hip abduction 27.4 26.7  Hip adduction    Hip internal rotation    Hip external rotation    Knee flexion    Knee extension 47.1 29.3  Ankle dorsiflexion    Ankle plantarflexion    Ankle inversion    Ankle eversion     (Blank rows = not tested)  LUMBAR SPECIAL  TESTS:  Slump test: Negative  FUNCTIONAL TESTS:  30 s STS 4 from pool bench with ue assist (norm 10-14 ) Timed up and go (TUG): 14.59 4 stage balance: passed 1&2; Tandem x 10s (unsteady); SLS x 6s  GAIT: Distance walked: 440ft Assistive device utilized: None Level of assistance: Complete Independence Comments: Left knee valgus knocking right.  TREATMENT  [facility] Adult PT Treatment:                                                DATE: 06/29/23 Pt seen for aquatic therapy today.  Treatment took place in water 3.5-4.75 ft in depth at the [facility] pool. Temp of water was 91.  Pt entered/exited the pool via stairs with hand rail.   *unsupported: walking forward/ backward in 4+ ft of water 4 laps * side stepping with arm addct/ abdc -cues to allow soft knees x 2 laps-> with rainbow hand floats x 2 laps * marching forward/backward with alternating row motion with rainbow hand floats  * TrA set with single rainbow hand float pull to thigh -> marching backwards tapping float to knee -> marching forward tapping hand to knee (hand floats too much resistance) * UE on wall:  hip abdct/ addct x 5 each; hip ext to toe touch x 10 each  * Straddled noodle, extra noodle under arms behind back: cycling * at stairs: R/L hamstring  stretch with cues for straight back x 15s each;  L stretch x 2 reps of 10 sec  Pt requires the buoyancy and hydrostatic pressure of water for support, and to offload joints by unweighting joint load by at least 50 % in navel deep water and by at least 75-80% in chest to neck deep water.  Viscosity of the water is needed for resistance of strengthening. Water current perturbations provides challenge to standing balance requiring increased core activation.                                                                                                                           PATIENT EDUCATION:  Education details: aquatic therapy intro  Person educated:  Patient Education method: Explanation Education comprehension: verbalized understanding  HOME EXERCISE PROGRAM: tba  ASSESSMENT:  CLINICAL IMPRESSION: Pt reported increase in low back pain with hip ext and also with knee tap with rainbow floats.  She is a good candidate for aquatic intervention and will benefit from the properties of water to progress towards functional goals. Goals are ongoing.      Initial impression Patient is a 73 y.o. f who was seen today for physical therapy evaluation and treatment for LBP.  She presents today with moderate pain sensitivity in focal area midline spine at L4-5- s1.  Pain reportedly interferes in daily activities and sleeping at night occasionally. Pt is not very active by own admission. Testing indicates strength deficits, she has difficulty rising from a seated position and some balance deficits evident mostly with transitional movements.  She will benefit from skilled PT intervention.  She does not and likely will not have pool access so we will begin with aquatic sessions for pain management and to initiate muscle lengthening/strengthening and balance then incorporate land based intervention for added load bearing strengthening and final HEP for continued management of chronic condition  OBJECTIVE IMPAIRMENTS: Abnormal gait, decreased activity tolerance, decreased balance, impaired flexibility, and pain.   ACTIVITY LIMITATIONS: sleeping, stairs, transfers, and locomotion level  PARTICIPATION LIMITATIONS: shopping and community activity  PERSONAL FACTORS: Fitness are also affecting patient's functional outcome.   REHAB POTENTIAL: Good  CLINICAL DECISION MAKING: Evolving/moderate complexity  EVALUATION COMPLEXITY: Moderate   GOALS: Goals reviewed with patient? Yes  SHORT TERM GOALS: Target date: 07/07/23  Pt will tolerate full aquatic sessions consistently without increase in pain and with improving function to demonstrate good toleration  and effectiveness of intervention.  Baseline: Goal status: INITIAL  2. Pt will report walking x 15 minutes 5/7 days a week to demonstrate committment to increasing her physical activity Baseline: not Goal Status: Initial  3.  Pt will report a reduction of LBP by at least 25% Baseline: see chart Goal status: INITIAL   LONG TERM GOALS: Target date: 08/04/23  Pt to improve on ODI by  20 % to demonstrate statistically significant Improvement in function. Baseline: 33% Goal status: INITIAL  2.  Pt will be indep with final HEP's (  land and aquatic as appropriate) for continued management of condition Baseline:  Goal status: INITIAL  3.  Pt will report decrease in pain by at least 75% for improved toleration to activity/quality of life and to demonstrate improved management of pain.  Baseline: see chart Goal status: INITIAL  4.  Pt will improve strength in LE up towards 10lbs to demonstrate improved overall physical function Baseline: see chart Goal status: INITIAL  5.  Pt will improve on 30s STS test to <or= 8 to demonstrate improving functional lower extremity strength, transitional movements, and balance Baseline: 4 Goal status: INITIAL  6.  Pt will improve on Tug test to <or=13s (norm) to demonstrate improvement in lower extremity function, mobility and decreased fall risk. Baseline: 14.59 Goal status: INITIAL  PLAN:  PT FREQUENCY: 2x/week  PT DURATION: 6 weeks  PLANNED INTERVENTIONS: [id number]- PT Re-evaluation, 97110-Therapeutic exercises, 97530- Therapeutic activity, 97112- Neuromuscular re-education, 97535- Self Care, 40981- Manual therapy, [phone]- Gait training, [phone]- Orthotic Fit/training, [phone]- Aquatic Therapy, [phone]- Ionotophoresis 4mg /ml Dexamethasone, Patient/Family education, Balance training, Stair training, Taping, Dry Needling, Joint mobilization, Cryotherapy, and Moist heat.  PLAN FOR NEXT SESSION: aquatic sessions for pain management and to initiate muscle  lengthening/strengthening and balance then incorporate land based intervention for added load bearing strengthening and final HEP for continued management of chronic condition  Mayer Camel, PTA 06/29/23 5:10 PM [facility] [address] [place], [place], [address] Phone: [phone]   Fax:  [phone]

## 2023-07-02 ENCOUNTER — Ambulatory Visit (HOSPITAL_BASED_OUTPATIENT_CLINIC_OR_DEPARTMENT_OTHER): Payer: Medicare Other | Admitting: Physical Therapy

## 2023-07-02 ENCOUNTER — Telehealth (HOSPITAL_BASED_OUTPATIENT_CLINIC_OR_DEPARTMENT_OTHER): Payer: Self-pay | Admitting: Physical Therapy

## 2023-07-02 NOTE — Telephone Encounter (Signed)
 Pt missed aquatic appointment today scheduled for 415p.  LVM informing of next visit scheduled Thurs 3/13.  Friendly reminder of NS/late cancel policy.  This is pt's first NS.

## 2023-07-03 ENCOUNTER — Encounter (HOSPITAL_BASED_OUTPATIENT_CLINIC_OR_DEPARTMENT_OTHER): Payer: Self-pay | Admitting: Pulmonary Disease

## 2023-07-03 ENCOUNTER — Ambulatory Visit (HOSPITAL_BASED_OUTPATIENT_CLINIC_OR_DEPARTMENT_OTHER): Payer: Medicare Other | Admitting: Pulmonary Disease

## 2023-07-03 VITALS — BP 124/80 | HR 86 | Ht 68.0 in | Wt 240.5 lb

## 2023-07-03 DIAGNOSIS — K219 Gastro-esophageal reflux disease without esophagitis: Secondary | ICD-10-CM | POA: Diagnosis not present

## 2023-07-03 DIAGNOSIS — G4733 Obstructive sleep apnea (adult) (pediatric): Secondary | ICD-10-CM | POA: Diagnosis not present

## 2023-07-03 NOTE — Patient Instructions (Signed)
 Increase EPAP to 12 cm
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
## 2023-07-05 ENCOUNTER — Ambulatory Visit (HOSPITAL_BASED_OUTPATIENT_CLINIC_OR_DEPARTMENT_OTHER): Payer: Medicare Other | Admitting: Physical Therapy

## 2023-07-05 ENCOUNTER — Other Ambulatory Visit: Payer: Self-pay | Admitting: Internal Medicine

## 2023-07-05 ENCOUNTER — Encounter (HOSPITAL_BASED_OUTPATIENT_CLINIC_OR_DEPARTMENT_OTHER): Payer: Self-pay | Admitting: Physical Therapy

## 2023-07-05 DIAGNOSIS — M5459 Other low back pain: Secondary | ICD-10-CM

## 2023-07-05 DIAGNOSIS — R2689 Other abnormalities of gait and mobility: Secondary | ICD-10-CM

## 2023-07-05 DIAGNOSIS — M6281 Muscle weakness (generalized): Secondary | ICD-10-CM

## 2023-07-05 NOTE — Therapy (Signed)
 OUTPATIENT PHYSICAL THERAPY THORACOLUMBAR TREATMENT   Patient Name: Jennifer [name] MRN: [id number] DOB:October 26, 1950, 73 y.o., female Today's Date: 07/05/2023  END OF SESSION:  PT End of Session - 07/05/23 1620     Visit Number 4    Number of Visits 14    Date for PT Re-Evaluation 08/04/23    Authorization Type medicare    Progress Note Due on Visit 10    PT Start Time 1617    PT Stop Time 1655    PT Time Calculation (min) 38 min    Behavior During Therapy [facility] for tasks assessed/performed             Past Medical History:  Diagnosis Date   Allergy    Anemia    Arthritis    Cataract    bil cataracts removed   Chronic chest pain    Chronic fatigue 02/03/2017   Clotting disorder (HCC)    Pulmonary Embolis 2010 & 2015   Colon polyps    inflamed partially ulcerated hyperplastic polyp   Diabetes (HCC) 09/24/2013   no meds   Diverticulosis    Dysphagia esophageal phase - chronic after 3 fundoplications 01/20/2014   Edema of both lower extremities    Esophageal stenosis    Stricture after fundoplication REDO   Fibromyalgia    GERD (gastroesophageal reflux disease)    Glaucoma suspect    Heart murmur    no problems    Hemorrhoids, internal, with bleeding 06/27/2017   History of hiatal hernia    Hypertension    IBS (irritable bowel syndrome)    Lactose intolerance    Mitral valve prolapse    OSA (obstructive sleep apnea)    on CPAP-Mild - Sleep study 2004   Pre-diabetes    Pulmonary embolus (HCC)    2010 and 12-2013   Pulmonary nodule 12/13/2022   seen on CT chest xray on 12/16/22 - left upper lobe   Sleep apnea    wears CPAP   Thyroid nodule      dr. watching    Tinnitus    Subjective   Zoster 2014   R flank   Past Surgical History:  Procedure Laterality Date   ABDOMINAL HYSTERECTOMY     still has 1 ovary    APPENDECTOMY     BRAIN SURGERY  1995   Stem surgery, Arnold Chiari Malformation   CATARACT EXTRACTION Bilateral 2017   cataracts, Dr. Elmer Picker    CHOLECYSTECTOMY     COLONOSCOPY     ESOPHAGEAL DILATION N/A 02/06/2023   Procedure: ESOPHAGEAL DILATION;  Surgeon: Corliss Skains, MD;  Location: [facility] OR;  Service: Thoracic;  Laterality: N/A;   ESOPHAGEAL DILATION N/A 02/13/2023   Procedure: ESOPHAGEAL DILATION;  Surgeon: Corliss Skains, MD;  Location: [facility] OR;  Service: Thoracic;  Laterality: N/A;   ESOPHAGEAL DILATION N/A 03/12/2023   Procedure: ESOPHAGEAL DILATION;  Surgeon: Corliss Skains, MD;  Location: [facility] OR;  Service: Thoracic;  Laterality: N/A;   ESOPHAGEAL MANOMETRY N/A 08/06/2019   Procedure: ESOPHAGEAL MANOMETRY (EM);  Surgeon: Iva Boop, MD;  Location: [facility] ENDOSCOPY;  Service: Endoscopy;  Laterality: N/A;   ESOPHAGOGASTRODUODENOSCOPY N/A 02/06/2023   Procedure: ESOPHAGOGASTRODUODENOSCOPY (EGD);  Surgeon: Corliss Skains, MD;  Location: [facility] OR;  Service: Thoracic;  Laterality: N/A;  With Savary dilation   ESOPHAGOGASTRODUODENOSCOPY N/A 02/13/2023   Procedure: ESOPHAGOGASTRODUODENOSCOPY (EGD);  Surgeon: Corliss Skains, MD;  Location: [facility] OR;  Service: Thoracic;  Laterality: N/A;   ESOPHAGOGASTRODUODENOSCOPY N/A 03/12/2023  Procedure: ESOPHAGOGASTRODUODENOSCOPY (EGD);  Surgeon: Corliss Skains, MD;  Location: [facility] OR;  Service: Thoracic;  Laterality: N/A;   HEMORRHOID BANDING     HERNIA REPAIR     Hital Hernia   JOINT REPLACEMENT Right    knee   KNEE ARTHROSCOPY  05/16/2012   Procedure: ARTHROSCOPY KNEE;  Surgeon: Kennieth Rad, MD;  Location: [facility] OR;  Service: Orthopedics;  Laterality: Left;   KNEE ARTHROSCOPY Right 01/2018   NISSEN FUNDOPLICATION     X 3 lab, open x 2 last with mesh   POLYPECTOMY     REDUCTION MAMMAPLASTY Bilateral 2007   TOTAL KNEE ARTHROPLASTY Right 08/25/2019   Procedure: RIGHT TOTAL KNEE ARTHROPLASTY;  Surgeon: Ollen Gross, MD;  Location: [facility] ORS;  Service: Orthopedics;  Laterality: Right;    UPPER GASTROINTESTINAL ENDOSCOPY     Patient Active Problem List    Diagnosis Date Noted   Vitamin D deficiency 12/12/2022   Decreased GFR 12/12/2022   Physically inactive 12/12/2022   Calculus of kidney 07/14/2021   OSA on CPAP 06/08/2021   Circadian rhythm sleep disorder, delayed sleep phase type 06/08/2021   Glaucoma suspect 06/25/2020   S/P total knee arthroplasty, right 08/27/2019   OA (osteoarthritis) of knee 08/25/2019   Primary osteoarthritis of right knee 08/25/2019   Atypical chest pain    Hemorrhoids, internal, with bleeding 06/27/2017   IDA (iron deficiency anemia) 02/20/2017   Chronic fatigue 02/03/2017   Left leg pain 11/29/2016   History of colonic polyps 12/02/2015   PCP NOTES >>>>>>>>>>>>>>>>>>>>>>>>> 02/13/2015   Anxiety 08/04/2014   Annual physical exam 04/29/2014   Fibromyalgia syndrome 02/04/2014   Dysphagia 01/20/2014   Chronic cough 01/08/2014   DOE (dyspnea on exertion) 12/26/2013   Diabetes (HCC) 09/24/2013   Abdominal pain, chronic, right flank -upper quadrant 07/02/2012   Class 2 severe obesity with serious comorbidity and body mass index (BMI) of 38.0 to 38.9 in adult [facility]) 09/30/2009   Hypersomnia with sleep apnea 09/28/2009   Essential hypertension 06/24/2009   DIZZINESS 06/24/2009   HEADACHE 06/24/2009   VENOUS INSUFFICIENCY, LEGS 11/19/2008   Acute pulmonary embolism (HCC) 05/19/2008   Pulmonary nodule 08/26/2007   Stricture and stenosis of esophagus 05/22/2007   GERD 01/31/2007   IBS (irritable bowel syndrome) 01/31/2007   MITRAL VALVE PROLAPSE, HX OF 01/31/2007    PCP: Willow Ora MD  REFERRING PROVIDER: Venita Lick, MD   REFERRING DIAG: M54.50 (ICD-10-CM) - Low back pain   Rationale for Evaluation and Treatment: Rehabilitation  THERAPY DIAG:  Other low back pain  Muscle weakness (generalized)  Other abnormalities of gait and mobility  ONSET DATE: Last 2 yrs  SUBJECTIVE:  SUBJECTIVE STATEMENT: Pt reports she was sore after first session, but took epsom salt bath that relieved soreness.   POOL ACCESS: currently none, but considering membership at [facility] or elsewhere.  Initial subjective Flare of LBP that has stayed for 3 weeks this time.  Thought it was my bed ??.  Using the pain patches every once in a while and they help. I wake up with pain. R TKR ~ 5 yrs ago.   PERTINENT HISTORY:   Lumbar spondylosis  PAIN:  Are you having pain? Yes: NPRS scale: current 5/10 Pain location: LB Pain description: ache Aggravating factors: sitting >30 minutes, lying on left side or too long on back Relieving factors: heating pad, pain patches  PRECAUTIONS: None  RED FLAGS: None   WEIGHT BEARING RESTRICTIONS: No  FALLS:  Has patient fallen in last 6 months? No   OCCUPATION: retired; Clinical cytogeneticist  PLOF: Independent  PATIENT GOALS: improve strength, get back into exercise.  NEXT MD VISIT: as needed  OBJECTIVE:  Note: Objective measures were completed at Evaluation unless otherwise noted.  DIAGNOSTIC FINDINGS:  Imaging from today does demonstrate mild degenerative disc disease at L4-L5 and L5-S1   PATIENT SURVEYS:  Modified Oswestry 15/45   COGNITION: Overall cognitive status: Within functional limits for tasks assessed     SENSATION: WFL  MUSCLE LENGTH: Hamstrings: Bilateral hamstring    POSTURE: rounded shoulders, forward head, and left knee valgus  PALPATION: Center l4-5; L5-S1  LUMBAR ROM:  wfl  LOWER EXTREMITY ROM:     wfl  LOWER EXTREMITY MMT:    MMT Right eval Left eval  Hip flexion 40.6 37.1  Hip extension    Hip abduction 27.4 26.7  Hip adduction    Hip internal rotation    Hip external rotation    Knee flexion    Knee extension 47.1 29.3  Ankle dorsiflexion    Ankle plantarflexion    Ankle inversion    Ankle eversion     (Blank rows = not  tested)  LUMBAR SPECIAL TESTS:  Slump test: Negative  FUNCTIONAL TESTS:  30 s STS 4 from pool bench with ue assist (norm 10-14 ) Timed up and go (TUG): 14.59 4 stage balance: passed 1&2; Tandem x 10s (unsteady); SLS x 6s  GAIT: Distance walked: 444ft Assistive device utilized: None Level of assistance: Complete Independence Comments: Left knee valgus knocking right.  TREATMENT  [facility] Adult PT Treatment:                                                DATE: 07/05/23 Pt seen for aquatic therapy today.  Treatment took place in water 3.5-4.75 ft in depth at the [facility] pool. Temp of water was 91.  Pt entered/exited the pool via stairs with hand rail.   *unsupported: walking forward/ backward in 4+ ft of water 4 laps * side stepping with arm addct/ abdc -cues to allow soft knees x 1 lap-> with rainbow hand floats x 3 laps * marching forward/backward with alternating row motion with rainbow hand floats  * TrA set with 1/2 hollow noodle pull down to thighs x 8 * hip hinge with forward arm reach, UE on yellow hand floats x 8 (some pain) * UE on wall:  hip abdct/ addct x 10 each; hip ext to toe touch x 10 each; single leg clams x 5 each  LE * Straddled square noodle, extra noodle under arms behind back: cycling with doggie paddle arms   Pt requires the buoyancy and hydrostatic pressure of water for support, and to offload joints by unweighting joint load by at least 50 % in navel deep water and by at least 75-80% in chest to neck deep water.  Viscosity of the water is needed for resistance of strengthening. Water current perturbations provides challenge to standing balance requiring increased core activation.                                                                                                                           PATIENT EDUCATION:  Education details: aquatic therapy intro ; posture and body mechanics hand out Person educated: Patient Education method:  Explanation Education comprehension: verbalized understanding  HOME EXERCISE PROGRAM: tba  ASSESSMENT:  CLINICAL IMPRESSION: Pt reported some increase in back pain with noodle pull down during TrA set as well as the hip hinge with forward arm reach.  Pt plans to check out local facilities with pool for membership; in past she completed water aerobics at pool at [facility] prior to injuring knee. She will benefit from the properties of water to progress towards functional goals. Goals are ongoing.      Initial impression Patient is a 73 y.o. f who was seen today for physical therapy evaluation and treatment for LBP.  She presents today with moderate pain sensitivity in focal area midline spine at L4-5- s1.  Pain reportedly interferes in daily activities and sleeping at night occasionally. Pt is not very active by own admission. Testing indicates strength deficits, she has difficulty rising from a seated position and some balance deficits evident mostly with transitional movements.  She will benefit from skilled PT intervention.  She does not and likely will not have pool access so we will begin with aquatic sessions for pain management and to initiate muscle lengthening/strengthening and balance then incorporate land based intervention for added load bearing strengthening and final HEP for continued management of chronic condition  OBJECTIVE IMPAIRMENTS: Abnormal gait, decreased activity tolerance, decreased balance, impaired flexibility, and pain.   ACTIVITY LIMITATIONS: sleeping, stairs, transfers, and locomotion level  PARTICIPATION LIMITATIONS: shopping and community activity  PERSONAL FACTORS: Fitness are also affecting patient's functional outcome.   REHAB POTENTIAL: Good  CLINICAL DECISION MAKING: Evolving/moderate complexity  EVALUATION COMPLEXITY: Moderate   GOALS: Goals reviewed with patient? Yes  SHORT TERM GOALS: Target date: 07/07/23  Pt will tolerate full aquatic  sessions consistently without increase in pain and with improving function to demonstrate good toleration and effectiveness of intervention.  Baseline: Goal status: INITIAL  2. Pt will report walking x 15 minutes 5/7 days a week to demonstrate committment to increasing her physical activity Baseline: not Goal Status: Initial  3.  Pt will report a reduction of LBP by at least 25% Baseline: see chart Goal status: INITIAL   LONG TERM GOALS: Target date: 08/04/23  Pt to  improve on ODI by  20 % to demonstrate statistically significant Improvement in function. Baseline: 33% Goal status: INITIAL  2.  Pt will be indep with final HEP's (land and aquatic as appropriate) for continued management of condition Baseline:  Goal status: INITIAL  3.  Pt will report decrease in pain by at least 75% for improved toleration to activity/quality of life and to demonstrate improved management of pain.  Baseline: see chart Goal status: INITIAL  4.  Pt will improve strength in LE up towards 10lbs to demonstrate improved overall physical function Baseline: see chart Goal status: INITIAL  5.  Pt will improve on 30s STS test to <or= 8 to demonstrate improving functional lower extremity strength, transitional movements, and balance Baseline: 4 Goal status: INITIAL  6.  Pt will improve on Tug test to <or=13s (norm) to demonstrate improvement in lower extremity function, mobility and decreased fall risk. Baseline: 14.59 Goal status: INITIAL  PLAN:  PT FREQUENCY: 2x/week  PT DURATION: 6 weeks  PLANNED INTERVENTIONS: [id number]- PT Re-evaluation, 97110-Therapeutic exercises, 97530- Therapeutic activity, 97112- Neuromuscular re-education, 97535- Self Care, 54098- Manual therapy, [phone]- Gait training, [phone]- Orthotic Fit/training, [phone]- Aquatic Therapy, [phone]- Ionotophoresis 4mg /ml Dexamethasone, Patient/Family education, Balance training, Stair training, Taping, Dry Needling, Joint mobilization, Cryotherapy,  and Moist heat.  PLAN FOR NEXT SESSION: aquatic sessions for pain management and to initiate muscle lengthening/strengthening and balance then incorporate land based intervention for added load bearing strengthening and final HEP for continued management of chronic condition  Mayer Camel, PTA 07/05/23 4:20 PM [facility] [address] [place], [place], [address] Phone: [phone]   Fax:  [phone]

## 2023-07-05 NOTE — Addendum Note (Signed)
 Addended byConrad [place] D on: 07/05/2023 07:32 AM   Modules accepted: Orders

## 2023-07-06 ENCOUNTER — Encounter: Payer: Self-pay | Admitting: Family

## 2023-07-07 ENCOUNTER — Encounter: Payer: Self-pay | Admitting: Family

## 2023-07-10 ENCOUNTER — Encounter (HOSPITAL_BASED_OUTPATIENT_CLINIC_OR_DEPARTMENT_OTHER): Payer: Self-pay | Admitting: Physical Therapy

## 2023-07-10 ENCOUNTER — Ambulatory Visit (HOSPITAL_BASED_OUTPATIENT_CLINIC_OR_DEPARTMENT_OTHER): Payer: Medicare Other | Admitting: Physical Therapy

## 2023-07-10 DIAGNOSIS — M6281 Muscle weakness (generalized): Secondary | ICD-10-CM

## 2023-07-10 DIAGNOSIS — M5459 Other low back pain: Secondary | ICD-10-CM | POA: Diagnosis not present

## 2023-07-10 DIAGNOSIS — R2689 Other abnormalities of gait and mobility: Secondary | ICD-10-CM | POA: Diagnosis not present

## 2023-07-10 NOTE — Therapy (Signed)
 OUTPATIENT PHYSICAL THERAPY THORACOLUMBAR TREATMENT   Patient Name: Jennifer [name] MRN: [id number] DOB:12-04-1950, 73 y.o., female Today's Date: 07/10/2023  END OF SESSION:  PT End of Session - 07/10/23 1431     Visit Number 5    Number of Visits 14    Date for PT Re-Evaluation 08/04/23    Authorization Type medicare    Progress Note Due on Visit 10    PT Start Time 1431    PT Stop Time 1513    PT Time Calculation (min) 42 min    Activity Tolerance Patient tolerated treatment well;Patient limited by pain              Past Medical History:  Diagnosis Date   Allergy    Anemia    Arthritis    Cataract    bil cataracts removed   Chronic chest pain    Chronic fatigue 02/03/2017   Clotting disorder (HCC)    Pulmonary Embolis 2010 & 2015   Colon polyps    inflamed partially ulcerated hyperplastic polyp   Diabetes (HCC) 09/24/2013   no meds   Diverticulosis    Dysphagia esophageal phase - chronic after 3 fundoplications 01/20/2014   Edema of both lower extremities    Esophageal stenosis    Stricture after fundoplication REDO   Fibromyalgia    GERD (gastroesophageal reflux disease)    Glaucoma suspect    Heart murmur    no problems    Hemorrhoids, internal, with bleeding 06/27/2017   History of hiatal hernia    Hypertension    IBS (irritable bowel syndrome)    Lactose intolerance    Mitral valve prolapse    OSA (obstructive sleep apnea)    on CPAP-Mild - Sleep study 2004   Pre-diabetes    Pulmonary embolus (HCC)    2010 and 12-2013   Pulmonary nodule 12/13/2022   seen on CT chest xray on 12/16/22 - left upper lobe   Sleep apnea    wears CPAP   Thyroid nodule      dr. watching    Tinnitus    Subjective   Zoster 2014   R flank   Past Surgical History:  Procedure Laterality Date   ABDOMINAL HYSTERECTOMY     still has 1 ovary    APPENDECTOMY     BRAIN SURGERY  1995   Stem surgery, Arnold Chiari Malformation   CATARACT EXTRACTION Bilateral 2017    cataracts, Dr. Elmer Picker   CHOLECYSTECTOMY     COLONOSCOPY     ESOPHAGEAL DILATION N/A 02/06/2023   Procedure: ESOPHAGEAL DILATION;  Surgeon: Corliss Skains, MD;  Location: [facility] OR;  Service: Thoracic;  Laterality: N/A;   ESOPHAGEAL DILATION N/A 02/13/2023   Procedure: ESOPHAGEAL DILATION;  Surgeon: Corliss Skains, MD;  Location: [facility] OR;  Service: Thoracic;  Laterality: N/A;   ESOPHAGEAL DILATION N/A 03/12/2023   Procedure: ESOPHAGEAL DILATION;  Surgeon: Corliss Skains, MD;  Location: [facility] OR;  Service: Thoracic;  Laterality: N/A;   ESOPHAGEAL MANOMETRY N/A 08/06/2019   Procedure: ESOPHAGEAL MANOMETRY (EM);  Surgeon: Iva Boop, MD;  Location: [facility] END[facility]COPY;  Service: Endoscopy;  Laterality: N/A;   ESOPHAGOGASTRODUODENOSCOPY N/A 02/06/2023   Procedure: ESOPHAGOGASTRODUODENOSCOPY (EGD);  Surgeon: Corliss Skains, MD;  Location: [facility] OR;  Service: Thoracic;  Laterality: N/A;  With Savary dilation   ESOPHAGOGASTRODUODENOSCOPY N/A 02/13/2023   Procedure: ESOPHAGOGASTRODUODENOSCOPY (EGD);  Surgeon: Corliss Skains, MD;  Location: [facility] OR;  Service: Thoracic;  Laterality: N/A;   ESOPHAGOGASTRODUODENOSCOPY  N/A 03/12/2023   Procedure: ESOPHAGOGASTRODUODENOSCOPY (EGD);  Surgeon: Corliss Skains, MD;  Location: [facility] OR;  Service: Thoracic;  Laterality: N/A;   HEMORRHOID BANDING     HERNIA REPAIR     Hital Hernia   JOINT REPLACEMENT Right    knee   KNEE ARTHROSCOPY  05/16/2012   Procedure: ARTHROSCOPY KNEE;  Surgeon: Kennieth Rad, MD;  Location: [facility] OR;  Service: Orthopedics;  Laterality: Left;   KNEE ARTHROSCOPY Right 01/2018   NISSEN FUNDOPLICATION     X 3 lab, open x 2 last with mesh   POLYPECTOMY     REDUCTION MAMMAPLASTY Bilateral 2007   TOTAL KNEE ARTHROPLASTY Right 08/25/2019   Procedure: RIGHT TOTAL KNEE ARTHROPLASTY;  Surgeon: Ollen Gross, MD;  Location: [facility] ORS;  Service: Orthopedics;  Laterality: Right;    UPPER GASTROINTESTINAL ENDOSCOPY     Patient  Active Problem List   Diagnosis Date Noted   Vitamin D deficiency 12/12/2022   Decreased GFR 12/12/2022   Physically inactive 12/12/2022   Calculus of kidney 07/14/2021   OSA on CPAP 06/08/2021   Circadian rhythm sleep disorder, delayed sleep phase type 06/08/2021   Glaucoma suspect 06/25/2020   S/P total knee arthroplasty, right 08/27/2019   OA (osteoarthritis) of knee 08/25/2019   Primary osteoarthritis of right knee 08/25/2019   Atypical chest pain    Hemorrhoids, internal, with bleeding 06/27/2017   IDA (iron deficiency anemia) 02/20/2017   Chronic fatigue 02/03/2017   Left leg pain 11/29/2016   History of colonic polyps 12/02/2015   PCP NOTES >>>>>>>>>>>>>>>>>>>>>>>>> 02/13/2015   Anxiety 08/04/2014   Annual physical exam 04/29/2014   Fibromyalgia syndrome 02/04/2014   Dysphagia 01/20/2014   Chronic cough 01/08/2014   DOE (dyspnea on exertion) 12/26/2013   Diabetes (HCC) 09/24/2013   Abdominal pain, chronic, right flank -upper quadrant 07/02/2012   Class 2 severe obesity with serious comorbidity and body mass index (BMI) of 38.0 to 38.9 in adult [facility]) 09/30/2009   Hypersomnia with sleep apnea 09/28/2009   Essential hypertension 06/24/2009   DIZZINESS 06/24/2009   HEADACHE 06/24/2009   VENOUS INSUFFICIENCY, LEGS 11/19/2008   Acute pulmonary embolism (HCC) 05/19/2008   Pulmonary nodule 08/26/2007   Stricture and stenosis of esophagus 05/22/2007   GERD 01/31/2007   IBS (irritable bowel syndrome) 01/31/2007   MITRAL VALVE PROLAPSE, HX OF 01/31/2007    PCP: Willow Ora MD  REFERRING PROVIDER: Venita Lick, MD   REFERRING DIAG: M54.50 (ICD-10-CM) - Low back pain   Rationale for Evaluation and Treatment: Rehabilitation  THERAPY DIAG:  Other low back pain  Muscle weakness (generalized)  Other abnormalities of gait and mobility  ONSET DATE: Last 2 yrs  SUBJECTIVE:  SUBJECTIVE STATEMENT: 3/10 pain with tylenol today. Felt good after last session, feels like pool has helped facilitate more comfortable movement.    POOL ACCESS: currently none, but considering membership at [facility] or elsewhere.  Initial subjective Flare of LBP that has stayed for 3 weeks this time.  Thought it was my bed ??.  Using the pain patches every once in a while and they help. I wake up with pain. R TKR ~ 5 yrs ago.   PERTINENT HISTORY:   Lumbar spondylosis  PAIN:  Are you having pain? Yes: NPRS scale: current 3/10 Pain location: LB Pain description: ache Aggravating factors: sitting >30 minutes, lying on left side or too long on back Relieving factors: heating pad, pain patches  PRECAUTIONS: None  RED FLAGS: None   WEIGHT BEARING RESTRICTIONS: No  FALLS:  Has patient fallen in last 6 months? No   OCCUPATION: retired; Clinical cytogeneticist  PLOF: Independent  PATIENT GOALS: improve strength, get back into exercise.  NEXT MD VISIT: as needed  OBJECTIVE:  Note: Objective measures were completed at Evaluation unless otherwise noted.  DIAGNOSTIC FINDINGS:  Imaging from today does demonstrate mild degenerative disc disease at L4-L5 and L5-S1   PATIENT SURVEYS:  Modified Oswestry 15/45   COGNITION: Overall cognitive status: Within functional limits for tasks assessed     SENSATION: WFL  MUSCLE LENGTH: Hamstrings: Bilateral hamstring    POSTURE: rounded shoulders, forward head, and left knee valgus  PALPATION: Center l4-5; L5-S1  LUMBAR ROM:  wfl  LOWER EXTREMITY ROM:     wfl  LOWER EXTREMITY MMT:    MMT Right eval Left eval  Hip flexion 40.6 37.1  Hip extension    Hip abduction 27.4 26.7  Hip adduction    Hip internal rotation    Hip external rotation    Knee flexion    Knee extension 47.1 29.3  Ankle dorsiflexion    Ankle plantarflexion    Ankle inversion     Ankle eversion     (Blank rows = not tested)  LUMBAR SPECIAL TESTS:  Slump test: Negative  FUNCTIONAL TESTS:  30 s STS 4 from pool bench with ue assist (norm 10-14 ) Timed up and go (TUG): 14.59 4 stage balance: passed 1&2; Tandem x 10s (unsteady); SLS x 6s  GAIT: Distance walked: 415ft Assistive device utilized: None Level of assistance: Complete Independence Comments: Left knee valgus knocking right.  TREATMENT  [facility] Adult PT Treatment:                                                DATE: 07/10/23 Therapeutic Exercise: Standing hip abduction AROM x8 BIL Standing heel/toe raises x8 cues for posture Standing hip extension AROM x8 BIL HEP establishment + education/handout  Neuromuscular re-ed: Hooklying PPT w/ ta activation 2x8 cues for breath control and comfortable ROM  (increased time to work on compensations) Seated hip adduction isometric w/ core contraction 2x8 cues for breath control and posture   Therapeutic Activity: Sidestepping along mat 3 laps Fwd/retro walking along mat 3 laps    [facility] Adult PT Treatment:                                                DATE: 07/05/23  Pt seen for aquatic therapy today.  Treatment took place in water 3.5-4.75 ft in depth at the [facility] pool. Temp of water was 91.  Pt entered/exited the pool via stairs with hand rail.   *unsupported: walking forward/ backward in 4+ ft of water 4 laps * side stepping with arm addct/ abdc -cues to allow soft knees x 1 lap-> with rainbow hand floats x 3 laps * marching forward/backward with alternating row motion with rainbow hand floats  * TrA set with 1/2 hollow noodle pull down to thighs x 8 * hip hinge with forward arm reach, UE on yellow hand floats x 8 (some pain) * UE on wall:  hip abdct/ addct x 10 each; hip ext to toe touch x 10 each; single leg clams x 5 each LE * Straddled square noodle, extra noodle under arms behind back: cycling with doggie paddle arms   Pt requires  the buoyancy and hydrostatic pressure of water for support, and to offload joints by unweighting joint load by at least 50 % in navel deep water and by at least 75-80% in chest to neck deep water.  Viscosity of the water is needed for resistance of strengthening. Water current perturbations provides challenge to standing balance requiring increased core activation.                                                                                                                           PATIENT EDUCATION:  Education details: rationale for interventions, HEP  Person educated: Patient Education method: Explanation, Demonstration, Tactile cues, Verbal cues Education comprehension: verbalized understanding, returned demonstration, verbal cues required, tactile cues required, and needs further education     HOME EXERCISE PROGRAM: Access Code: [email or web address] URL: [email or web address] Date: 07/10/2023 Prepared by: Fransisco Hertz  Exercises - Seated Hip Adduction Isometrics with Newman Pies  - 2-3 x daily - 1 sets - 8-10 reps - Heel Raises with Counter Support  - 2-3 x daily - 1 sets - 8-10 reps - Standing Hip Extension with Counter Support  - 2-3 x daily - 1 sets - 8-10 reps  ASSESSMENT:  CLINICAL IMPRESSION: 07/10/2023 Pt arrives w/ 3/10 pain for her first land based treatment, reporting some improvement w/ aquatics. Pt tolerates activity fairly well although does endorse some muscular fatigue, has some difficulty coordinating pelvic tilts but tends to improve with repetition. No adverse events, does have some symptom irritability throughout session and would likely continue to benefit from aquatics to improve tolerance. Recommend continuing along current POC in order to address relevant deficits and improve functional tolerance. Pt departs today's session in no acute distress, all voiced questions/concerns addressed appropriately from PT perspective.     Initial impression Patient is a 73 y.o.  f who was seen today for physical therapy evaluation and treatment for LBP.  She presents today with moderate pain sensitivity in focal area midline spine at L4-5- s1.  Pain reportedly interferes in daily activities and sleeping at night occasionally.  Pt is not very active by own admission. Testing indicates strength deficits, she has difficulty rising from a seated position and some balance deficits evident mostly with transitional movements.  She will benefit from skilled PT intervention.  She does not and likely will not have pool access so we will begin with aquatic sessions for pain management and to initiate muscle lengthening/strengthening and balance then incorporate land based intervention for added load bearing strengthening and final HEP for continued management of chronic condition  OBJECTIVE IMPAIRMENTS: Abnormal gait, decreased activity tolerance, decreased balance, impaired flexibility, and pain.   ACTIVITY LIMITATIONS: sleeping, stairs, transfers, and locomotion level  PARTICIPATION LIMITATIONS: shopping and community activity  PERSONAL FACTORS: Fitness are also affecting patient's functional outcome.   REHAB POTENTIAL: Good  CLINICAL DECISION MAKING: Evolving/moderate complexity  EVALUATION COMPLEXITY: Moderate   GOALS: Goals reviewed with patient? Yes  SHORT TERM GOALS: Target date: 07/07/23  Pt will tolerate full aquatic sessions consistently without increase in pain and with improving function to demonstrate good toleration and effectiveness of intervention.  Baseline: 07/10/23: reports improving tolerance w/ aquatics Goal status: MET  2. Pt will report walking x 15 minutes 5/7 days a week to demonstrate committment to increasing her physical activity Baseline: not Goal status: ONGOING  3.  Pt will report a reduction of LBP by at least 25% Baseline: see chart 07/10/23: continues to endorse fluctuations Goal status: ONGOING   LONG TERM GOALS: Target date:  08/04/23  Pt to improve on ODI by  20 % to demonstrate statistically significant Improvement in function. Baseline: 33% Goal status: INITIAL  2.  Pt will be indep with final HEP's (land and aquatic as appropriate) for continued management of condition Baseline:  Goal status: INITIAL  3.  Pt will report decrease in pain by at least 75% for improved toleration to activity/quality of life and to demonstrate improved management of pain.  Baseline: see chart Goal status: INITIAL  4.  Pt will improve strength in LE up towards 10lbs to demonstrate improved overall physical function Baseline: see chart Goal status: INITIAL  5.  Pt will improve on 30s STS test to <or= 8 to demonstrate improving functional lower extremity strength, transitional movements, and balance Baseline: 4 Goal status: INITIAL  6.  Pt will improve on Tug test to <or=13s (norm) to demonstrate improvement in lower extremity function, mobility and decreased fall risk. Baseline: 14.59 Goal status: INITIAL  PLAN:  PT FREQUENCY: 2x/week  PT DURATION: 6 weeks  PLANNED INTERVENTIONS: 97164- PT Re-evaluation, 97110-Therapeutic exercises, 97530- Therapeutic activity, 97112- Neuromuscular re-education, 97535- Self Care, 21308- Manual therapy, [phone]- Gait training, [phone]- Orthotic Fit/training, [phone]- Aquatic Therapy, [phone]- Ionotophoresis 4mg /ml Dexamethasone, Patient/Family education, Balance training, Stair training, Taping, Dry Needling, Joint mobilization, Cryotherapy, and Moist heat.  PLAN FOR NEXT SESSION: aquatic sessions for pain management and to initiate muscle lengthening/strengthening and balance then incorporate land based intervention for added load bearing strengthening and final HEP for continued management of chronic condition  Ashley Murrain PT, DPT 07/10/2023 3:16 PM    [facility] [address] [place], [place], [address] Phone: [phone]   Fax:   [phone]

## 2023-07-11 ENCOUNTER — Encounter: Payer: Self-pay | Admitting: Gastroenterology

## 2023-07-12 ENCOUNTER — Encounter: Payer: Self-pay | Admitting: Dietician

## 2023-07-12 ENCOUNTER — Encounter: Payer: Medicare Other | Attending: Internal Medicine | Admitting: Dietician

## 2023-07-12 ENCOUNTER — Other Ambulatory Visit: Payer: Self-pay

## 2023-07-12 DIAGNOSIS — E1169 Type 2 diabetes mellitus with other specified complication: Secondary | ICD-10-CM | POA: Insufficient documentation

## 2023-07-12 DIAGNOSIS — E041 Nontoxic single thyroid nodule: Secondary | ICD-10-CM

## 2023-07-12 NOTE — Patient Instructions (Addendum)
 Patient wishes to speak with her pharmacist regarding medications that can be crushed Patient wishes to speak to her friend Jennifer [name] to help with consistency of exercise.   Consistently track her intake Nails - do you have adequate zinc and protein? Nutrition variety  Aim for 20-30 grams of protein with each meal  Very soft meats (moist cooking methods may be best)  Cottage cheese  Yogurt (particularly greek)  PB2 (peanut butter powder - unsweetened)  Soft scrambled eggs  Protein shakes - Consider adding 3 T chia seeds to 4 oz shake and refrigerate overnight.  When you set up your app in the premium plan:  30% protein, 30% fat, 40% carbs  Exercise:  Aim to be more active - [place] job on adding this!  Jennifer [name]  with [employer] - patient goal  Walk or other aerobic exercise 30 minutes (start slow and increase as tolerated) Bands or weights 2 times per week   Continue to use a liquid vitamin.  Consider magnesium, omega 3.  Compare brands for quality. Ask your pharmacist about your medications and if they should be crushed.  When you are unable to swallow, you will need to rely on liquids during those times and need to be mindful to get your vitamins, minerals and adequate protein.  Consider Orgain plant based protein shakes (mix with 1/2 banana and fairlife milk or plant based milk of choice) Avoid bread  Consider your eating window to avoid eating too late.

## 2023-07-12 NOTE — Progress Notes (Unsigned)
 Medical Nutrition Therapy  Appointment Start time:  [phone] Appointment End time:  1435 She was last seen by this RD on 1/20/20254.  She states that she is feeling good. She has been walking but doing more water Aerobics due to increased arthritis in back.  She has some back issues in January.  She has a walking pad. Her shoulders hurt but has been doing 5 lb hand weights. She states that she may need another dilation as food is getting caught again and continues to eat soft foods and crushes her meds well.  She states that rice and very soft chicken hurt going down.  She is chewing her food the consistency of mashed potatoes.  Takes hot water with lemon prior to each meal.  She is being mindful to eat enough protein. No further throat dilations since last visit but patient states that she should call her doctor regarding this. She has lost 22 lbs in the past 3 months. She checks her blood glucose occasionally.  It was 110 after dinner and fasting 97.    Primary concerns today: Patient has had dysphagia in the past.  This was dilated the week before Thanksgiving and had been having this done weekly for about 3 weeks and now prn.  History of Nissan Fundoplication 2004 and the wrap is too tight per patient.  She has a mild sore throat from multiple procedures. Drinks hot water prior to eating to relax her esophagus.  She has gone to [facility].  She states that she was referred as she cannot tolerate all of the foods that are recommended on their plan and has not returned. She is tracking her intake and states that she does not eat much.  We looked at her ability She states that she now should eat 1200 calories per day. Yesterday she ate and estimated 1400 calories and 95 grams protein based on corrections. Chews her food until it is the consistency of mashed potatoes. She states that she wishes to exercise again as she lost 10 lbs when she went on vacation and had  increased walking.  Referral diagnosis: dysphagia, type 2 diabetes, obesity, OSA on C-pap Preferred learning style: no preference indicated Learning readiness: ready, change in progress  NUTRITION ASSESSMENT  Weight hx: 68.5" 239 lbs 07/12/2023 250 lbs 05/14/2023 261 lbs 03/30/2023 250 lbs 2014 229 lbs with knee surgery 210 lbs 2000 when she married  Clinical Medical Hx: Type 2 diabetes (about 2014), Nissan Fundoplication, GERD, HTN, thyroid disease, anemia, vitamin D deficiency, OSA (c-pap) Medications include: Mounjaro, Xarelto, Crestor Labs: A1C 6.2% 06/08/2023 and 6.3% 12/12/2022, eGFR 57 03/16/2023 Notable Signs/Symptoms: dysphagia  Lifestyle & Dietary Hx Patient lives with her husband.  She does the shopping and cooking.  She is retired from the [employer]. Hurt knee, required a knee replacement and has not gone to the gym since.  She does use a peddler and she has not gone back to the gym.  Has a close friend that has a gym and is a Psychologist, educational in [employer].  Supplements: liquid vitamin Sleep: fair - uses c -pap and inability to fall asleep at times. Stress / self-care:  Current average weekly physical activity: Planning on seeing her friend who is a Systems analyst.  She has gotten a walking pad, light weights. Water exercises, aerobics   24-Hr Dietary Recall First Meal: fairlife protein shake Snack: none Second Meal: [place] salad from [employer] (chicken) Third Meal: [place] soup  and crackers Snack: Chobani yogurt Snack: Activia yogurt, pickled beets Beverages: water  Estimated Energy Needs Calories: 1200 Carbohydrate: 120g Protein: 90g Fat: 40g   NUTRITION DIAGNOSIS  NB-1.1 Food and nutrition-related knowledge deficit As related to balance of carbohydrates, protein, and fat as well as foods for dysphagia.  As evidenced by diet hx and patient report.   NUTRITION INTERVENTION  Nutrition education (E-1) on the following topics: initial visit.  Today we  focussed on eating ideas due to dysphagia Sources of protein and choices best for dysphagia Mindfulness, importance of habit change Nutrition variety and quality to avoid nutrient deficiency Exercise benefits and goal setting - encouraged patient with this Liquid vitamin comparison and options  Handouts Provided Include  none  Learning Style & Readiness for Change Teaching method utilized: Visual & Auditory  Demonstrated degree of understanding via: Teach Back  Barriers to learning/adherence to lifestyle change: none  Goals Established by Pt Patient wishes to speak with her pharmacist regarding medications that can be crushed Patient wishes to speak to her friend Madison Hickman to help with consistency of exercise.   Consistently track her intake Nails - do you have adequate zinc and protein? Nutrition variety  Aim for 20-30 grams of protein with each meal  Very soft meats (moist cooking methods may be best)  Cottage cheese  Yogurt (particularly greek)  PB2 (peanut butter powder - unsweetened)  Soft scrambled eggs  Protein shakes - Consider adding 3 T chia seeds to 4 oz shake and refrigerate overnight.  When you set up your app in the premium plan:  30% protein, 30% fat, 40% carbs  Exercise:  Aim to be more active - [place] job on adding this!  [name]  with [employer] - patient goal  Walk or other aerobic exercise 30 minutes (start slow and increase as tolerated) Bands or weights 2 times per week   Continue to use a liquid vitamin.  Consider magnesium, omega 3.  Compare brands for quality. Ask your pharmacist about your medications and if they should be crushed.  When you are unable to swallow, you will need to rely on liquids during those times and need to be mindful to get your vitamins, minerals and adequate protein.  Consider Orgain plant based protein shakes (mix with 1/2 banana and fairlife milk or plant based milk of choice) Avoid bread  Consider your eating window to avoid  eating too late.  MONITORING & EVALUATION Dietary intake, weekly physical activity, and label reading in 2 months.  Next Steps  Patient is to call for questions.

## 2023-07-13 ENCOUNTER — Ambulatory Visit (HOSPITAL_BASED_OUTPATIENT_CLINIC_OR_DEPARTMENT_OTHER): Payer: Medicare Other | Admitting: Physical Therapy

## 2023-07-13 ENCOUNTER — Encounter (HOSPITAL_BASED_OUTPATIENT_CLINIC_OR_DEPARTMENT_OTHER): Payer: Self-pay | Admitting: Physical Therapy

## 2023-07-13 DIAGNOSIS — M5459 Other low back pain: Secondary | ICD-10-CM | POA: Diagnosis not present

## 2023-07-13 DIAGNOSIS — M6281 Muscle weakness (generalized): Secondary | ICD-10-CM

## 2023-07-13 DIAGNOSIS — R2689 Other abnormalities of gait and mobility: Secondary | ICD-10-CM | POA: Diagnosis not present

## 2023-07-13 NOTE — Therapy (Signed)
 OUTPATIENT PHYSICAL THERAPY THORACOLUMBAR TREATMENT   Patient Name: Jennifer [name] MRN: [id number] DOB:[birth year], 73 y.o., female Today's Date: 07/13/2023  END OF SESSION:  PT End of Session - 07/13/23 1614     Visit Number 6    Number of Visits 14    Date for PT Re-Evaluation 08/04/23    Authorization Type medicare    Progress Note Due on Visit 10    PT Start Time 1616   Pt arrived late to pool area   PT Stop Time 1645    PT Time Calculation (min) 29 min    Behavior During Therapy [facility] for tasks assessed/performed              Past Medical History:  Diagnosis Date   Allergy    Anemia    Arthritis    Cataract    bil cataracts removed   Chronic chest pain    Chronic fatigue 02/03/2017   Clotting disorder (HCC)    Pulmonary Embolis 2010 & 2015   Colon polyps    inflamed partially ulcerated hyperplastic polyp   Diabetes (HCC) 09/24/2013   no meds   Diverticulosis    Dysphagia esophageal phase - chronic after 3 fundoplications 01/20/2014   Edema of both lower extremities    Esophageal stenosis    Stricture after fundoplication REDO   Fibromyalgia    GERD (gastroesophageal reflux disease)    Glaucoma suspect    Heart murmur    no problems    Hemorrhoids, internal, with bleeding 06/27/2017   History of hiatal hernia    Hypertension    IBS (irritable bowel syndrome)    Lactose intolerance    Mitral valve prolapse    OSA (obstructive sleep apnea)    on CPAP-Mild - Sleep study 2004   Pre-diabetes    Pulmonary embolus (HCC)    2010 and 12-2013   Pulmonary nodule 12/13/2022   seen on CT chest xray on 12/16/22 - left upper lobe   Sleep apnea    wears CPAP   Thyroid nodule      dr. watching    Tinnitus    Subjective   Zoster 2014   R flank   Past Surgical History:  Procedure Laterality Date   ABDOMINAL HYSTERECTOMY     still has 1 ovary    APPENDECTOMY     BRAIN SURGERY  1995   Stem surgery, Arnold Chiari Malformation   CATARACT EXTRACTION Bilateral  2017   cataracts, Dr. Elmer Picker   CHOLECYSTECTOMY     COLONOSCOPY     ESOPHAGEAL DILATION N/A 02/06/2023   Procedure: ESOPHAGEAL DILATION;  Surgeon: Corliss Skains, MD;  Location: [facility] OR;  Service: Thoracic;  Laterality: N/A;   ESOPHAGEAL DILATION N/A 02/13/2023   Procedure: ESOPHAGEAL DILATION;  Surgeon: Corliss Skains, MD;  Location: [facility] OR;  Service: Thoracic;  Laterality: N/A;   ESOPHAGEAL DILATION N/A 03/12/2023   Procedure: ESOPHAGEAL DILATION;  Surgeon: Corliss Skains, MD;  Location: [facility] OR;  Service: Thoracic;  Laterality: N/A;   ESOPHAGEAL MANOMETRY N/A 08/06/2019   Procedure: ESOPHAGEAL MANOMETRY (EM);  Surgeon: Iva Boop, MD;  Location: [facility] ENDOSCOPY;  Service: Endoscopy;  Laterality: N/A;   ESOPHAGOGASTRODUODENOSCOPY N/A 02/06/2023   Procedure: ESOPHAGOGASTRODUODENOSCOPY (EGD);  Surgeon: Corliss Skains, MD;  Location: [facility] OR;  Service: Thoracic;  Laterality: N/A;  With Savary dilation   ESOPHAGOGASTRODUODENOSCOPY N/A 02/13/2023   Procedure: ESOPHAGOGASTRODUODENOSCOPY (EGD);  Surgeon: Corliss Skains, MD;  Location[facility] OR;  Service: Thoracic;  Laterality: N/A;   ESOPHAGOGASTRODUODENOSCOPY N/A 03/12/2023   Procedure: ESOPHAGOGASTRODUODENOSCOPY (EGD);  Surgeon: Corliss Skains, MD;  Location: [facility] OR;  Service: Thoracic;  Laterality: N/A;   HEMORRHOID BANDING     HERNIA REPAIR     Hital Hernia   JOINT REPLACEMENT Right    knee   KNEE ARTHROSCOPY  05/16/2012   Procedure: ARTHROSCOPY KNEE;  Surgeon: Kennieth Rad, MD;  Location: [facility] OR;  Service: Orthopedics;  Laterality: Left;   KNEE ARTHROSCOPY Right 01/2018   NISSEN FUNDOPLICATION     X 3 lab, open x 2 last with mesh   POLYPECTOMY     REDUCTION MAMMAPLASTY Bilateral 2007   TOTAL KNEE ARTHROPLASTY Right 08/25/2019   Procedure: RIGHT TOTAL KNEE ARTHROPLASTY;  Surgeon: Ollen Gross, MD;  Location: [facility] ORS;  Service: Orthopedics;  Laterality: Right;    UPPER GASTROINTESTINAL ENDOSCOPY      Patient Active Problem List   Diagnosis Date Noted   Vitamin D deficiency 12/12/2022   Decreased GFR 12/12/2022   Physically inactive 12/12/2022   Calculus of kidney 07/14/2021   OSA on CPAP 06/08/2021   Circadian rhythm sleep disorder, delayed sleep phase type 06/08/2021   Glaucoma suspect 06/25/2020   S/P total knee arthroplasty, right 08/27/2019   OA (osteoarthritis) of knee 08/25/2019   Primary osteoarthritis of right knee 08/25/2019   Atypical chest pain    Hemorrhoids, internal, with bleeding 06/27/2017   IDA (iron deficiency anemia) 02/20/2017   Chronic fatigue 02/03/2017   Left leg pain 11/29/2016   History of colonic polyps 12/02/2015   PCP NOTES >>>>>>>>>>>>>>>>>>>>>>>>> 02/13/2015   Anxiety 08/04/2014   Annual physical exam 04/29/2014   Fibromyalgia syndrome 02/04/2014   Dysphagia 01/20/2014   Chronic cough 01/08/2014   DOE (dyspnea on exertion) 12/26/2013   Diabetes (HCC) 09/24/2013   Abdominal pain, chronic, right flank -upper quadrant 07/02/2012   Class 2 severe obesity with serious comorbidity and body mass index (BMI) of 38.0 to 38.9 in adult [facility]) 09/30/2009   Hypersomnia with sleep apnea 09/28/2009   Essential hypertension 06/24/2009   DIZZINESS 06/24/2009   HEADACHE 06/24/2009   VENOUS INSUFFICIENCY, LEGS 11/19/2008   Acute pulmonary embolism (HCC) 05/19/2008   Pulmonary nodule 08/26/2007   Stricture and stenosis of esophagus 05/22/2007   GERD 01/31/2007   IBS (irritable bowel syndrome) 01/31/2007   MITRAL VALVE PROLAPSE, HX OF 01/31/2007    PCP: Willow Ora MD  REFERRING PROVIDER: Venita Lick, MD   REFERRING DIAG: M54.50 (ICD-10-CM) - Low back pain   Rationale for Evaluation and Treatment: Rehabilitation  THERAPY DIAG:  Other low back pain  Muscle weakness (generalized)  Other abnormalities of gait and mobility  ONSET DATE: Last 2 yrs  SUBJECTIVE:  SUBJECTIVE STATEMENT: "Land is different than the water.  I still took my bath afterwards, and took tylenol beforehand".   POOL ACCESS: currently none, but considering membership at [facility] or elsewhere.  Initial subjective Flare of LBP that has stayed for 3 weeks this time.  Thought it was my bed ??.  Using the pain patches every once in a while and they help. I wake up with pain. R TKR ~ 5 yrs ago.   PERTINENT HISTORY:   Lumbar spondylosis  PAIN:  Are you having pain? Yes: NPRS scale: current 4/10 Pain location: LB Pain description: ache Aggravating factors: sitting >30 minutes, lying on left side or too long on back Relieving factors: heating pad, pain patches  PRECAUTIONS: None  RED FLAGS: None   WEIGHT BEARING RESTRICTIONS: No  FALLS:  Has patient fallen in last 6 months? No   OCCUPATION: retired; Clinical cytogeneticist  PLOF: Independent  PATIENT GOALS: improve strength, get back into exercise.  NEXT MD VISIT: as needed  OBJECTIVE:  Note: Objective measures were completed at Evaluation unless otherwise noted.  DIAGNOSTIC FINDINGS:  Imaging from today does demonstrate mild degenerative disc disease at L4-L5 and L5-S1   PATIENT SURVEYS:  Modified Oswestry 15/45   COGNITION: Overall cognitive status: Within functional limits for tasks assessed     SENSATION: WFL  MUSCLE LENGTH: Hamstrings: Bilateral hamstring    POSTURE: rounded shoulders, forward head, and left knee valgus  PALPATION: Center l4-5; L5-S1  LUMBAR ROM:  wfl  LOWER EXTREMITY ROM:     wfl  LOWER EXTREMITY MMT:    MMT Right eval Left eval  Hip flexion 40.6 37.1  Hip extension    Hip abduction 27.4 26.7  Hip adduction    Hip internal rotation    Hip external rotation    Knee flexion    Knee extension 47.1 29.3  Ankle dorsiflexion    Ankle plantarflexion    Ankle inversion    Ankle  eversion     (Blank rows = not tested)  LUMBAR SPECIAL TESTS:  Slump test: Negative  FUNCTIONAL TESTS:  30 s STS 4 from pool bench with ue assist (norm 10-14 ) Timed up and go (TUG): 14.59 4 stage balance: passed 1&2; Tandem x 10s (unsteady); SLS x 6s  GAIT: Distance walked: 427ft Assistive device utilized: None Level of assistance: Complete Independence Comments: Left knee valgus knocking right.  TREATMENT  [facility] Adult PT Treatment:                                                DATE: 07/13/23 Pt seen for aquatic therapy today.  Treatment took place in water 3.5-4.75 ft in depth at the [facility] pool. Temp of water was 91.  Pt entered/exited the pool via stairs with hand rail.   *unsupported: walking forward/ backward in 4+ ft of water 4 laps, cues for neutral R foot and even step length * side stepping with arm addct/ abdc -cues to allow soft knees x 1 lap-> with rainbow hand floats x 2 laps * wide stance with reciprocal arm swing with medium resistance bells; horiz abdct/ addct  * marching forward with alternating row motion with medium resistance bells  * UE on wall:  hip abdct/ addct x 10 each; single leg clams x 10 each LE * TrA set with 1/2 hollow noodle pull down to thighs  x 10 * Straddled square noodle, extra noodle under arms behind back: cycling    Pt requires the buoyancy and hydrostatic pressure of water for support, and to offload joints by unweighting joint load by at least 50 % in navel deep water and by at least 75-80% in chest to neck deep water.  Viscosity of the water is needed for resistance of strengthening. Water current perturbations provides challenge to standing balance requiring increased core activation.  [facility] Adult PT Treatment:                                                DATE: 07/10/23 Therapeutic Exercise: Standing hip abduction AROM x8 BIL Standing heel/toe raises x8 cues for posture Standing hip extension AROM x8 BIL HEP establishment  + education/handout  Neuromuscular re-ed: Hooklying PPT w/ ta activation 2x8 cues for breath control and comfortable ROM  (increased time to work on compensations) Seated hip adduction isometric w/ core contraction 2x8 cues for breath control and posture   Therapeutic Activity: Sidestepping along mat 3 laps Fwd/retro walking along mat 3 laps    [facility] Adult PT Treatment:                                                DATE: 07/05/23 Pt seen for aquatic therapy today.  Treatment took place in water 3.5-4.75 ft in depth at the [facility] pool. Temp of water was 91.  Pt entered/exited the pool via stairs with hand rail.   *unsupported: walking forward/ backward in 4+ ft of water 4 laps * side stepping with arm addct/ abdc -cues to allow soft knees x 1 lap-> with rainbow hand floats x 3 laps * marching forward/backward with alternating row motion with rainbow hand floats  * TrA set with 1/2 hollow noodle pull down to thighs x 8 * hip hinge with forward arm reach, UE on yellow hand floats x 8 (some pain) * UE on wall:  hip abdct/ addct x 10 each; hip ext to toe touch x 10 each; single leg clams x 5 each LE * Straddled square noodle, extra noodle under arms behind back: cycling with doggie paddle arms   Pt requires the buoyancy and hydrostatic pressure of water for support, and to offload joints by unweighting joint load by at least 50 % in navel deep water and by at least 75-80% in chest to neck deep water.  Viscosity of the water is needed for resistance of strengthening. Water current perturbations provides challenge to standing balance requiring increased core activation.  PATIENT EDUCATION:  Education details: rationale for interventions Person educated: Patient Education method: Explanation, Demonstration, Tactile cues, Verbal cues Education comprehension:  verbalized understanding, returned demonstration, verbal cues required, tactile cues required, and needs further education     HOME EXERCISE PROGRAM: Access Code: [email or web address] URL: [email or web address] Date: 07/10/2023 Prepared by: Fransisco Hertz  Exercises - Seated Hip Adduction Isometrics with Newman Pies  - 2-3 x daily - 1 sets - 8-10 reps - Heel Raises with Counter Support  - 2-3 x daily - 1 sets - 8-10 reps - Standing Hip Extension with Counter Support  - 2-3 x daily - 1 sets - 8-10 reps  ASSESSMENT:  CLINICAL IMPRESSION: Session shortened due to pt's late arrival.  Pt with some increased LB discomfort with L hip abdct; reduced with change in exercise.   Overall, pt reports decreased intensity of pain while exercising in water. Pt is making gradual progress towards goals.     Initial impression Patient is a 73 y.o. f who was seen today for physical therapy evaluation and treatment for LBP.  She presents today with moderate pain sensitivity in focal area midline spine at L4-5- s1.  Pain reportedly interferes in daily activities and sleeping at night occasionally. Pt is not very active by own admission. Testing indicates strength deficits, she has difficulty rising from a seated position and some balance deficits evident mostly with transitional movements.  She will benefit from skilled PT intervention.  She does not and likely will not have pool access so we will begin with aquatic sessions for pain management and to initiate muscle lengthening/strengthening and balance then incorporate land based intervention for added load bearing strengthening and final HEP for continued management of chronic condition  OBJECTIVE IMPAIRMENTS: Abnormal gait, decreased activity tolerance, decreased balance, impaired flexibility, and pain.   ACTIVITY LIMITATIONS: sleeping, stairs, transfers, and locomotion level  PARTICIPATION LIMITATIONS: shopping and community activity  PERSONAL FACTORS: Fitness  are also affecting patient's functional outcome.   REHAB POTENTIAL: Good  CLINICAL DECISION MAKING: Evolving/moderate complexity  EVALUATION COMPLEXITY: Moderate   GOALS: Goals reviewed with patient? Yes  SHORT TERM GOALS: Target date: 07/07/23  Pt will tolerate full aquatic sessions consistently without increase in pain and with improving function to demonstrate good toleration and effectiveness of intervention.  Baseline: 07/10/23: reports improving tolerance w/ aquatics Goal status: MET  2. Pt will report walking x 15 minutes 5/7 days a week to demonstrate committment to increasing her physical activity Baseline: not Goal status: ONGOING  3.  Pt will report a reduction of LBP by at least 25% Baseline: see chart 07/10/23: continues to endorse fluctuations Goal status: ONGOING   LONG TERM GOALS: Target date: 08/04/23  Pt to improve on ODI by  20 % to demonstrate statistically significant Improvement in function. Baseline: 33% Goal status: INITIAL  2.  Pt will be indep with final HEP's (land and aquatic as appropriate) for continued management of condition Baseline:  Goal status: INITIAL  3.  Pt will report decrease in pain by at least 75% for improved toleration to activity/quality of life and to demonstrate improved management of pain.  Baseline: see chart Goal status: INITIAL  4.  Pt will improve strength in LE up towards 10lbs to demonstrate improved overall physical function Baseline: see chart Goal status: INITIAL  5.  Pt will improve on 30s STS test to <or= 8 to demonstrate improving functional lower extremity strength, transitional movements, and balance Baseline: 4 Goal status: INITIAL  6.  Pt will improve on  Tug test to <or=13s (norm) to demonstrate improvement in lower extremity function, mobility and decreased fall risk. Baseline: 14.59 Goal status: INITIAL  PLAN:  PT FREQUENCY: 2x/week  PT DURATION: 6 weeks  PLANNED INTERVENTIONS: 97164- PT  Re-evaluation, 97110-Therapeutic exercises, 97530- Therapeutic activity, 97112- Neuromuscular re-education, 97535- Self Care, 40102- Manual therapy, [phone]- Gait training, [phone]- Orthotic Fit/training, [phone]- Aquatic Therapy, [phone]- Ionotophoresis 4mg /ml Dexamethasone, Patient/Family education, Balance training, Stair training, Taping, Dry Needling, Joint mobilization, Cryotherapy, and Moist heat.  PLAN FOR NEXT SESSION: aquatic sessions for pain management and to initiate muscle lengthening/strengthening and balance then incorporate land based intervention for added load bearing strengthening and final HEP for continued management of chronic condition  Mayer Camel, PTA 07/13/23 4:57 PM [facility] [address] [place], [place], [address] Phone: [phone]   Fax:  [phone]

## 2023-07-16 ENCOUNTER — Encounter: Payer: Self-pay | Admitting: Family

## 2023-07-16 ENCOUNTER — Telehealth: Payer: Self-pay

## 2023-07-16 ENCOUNTER — Other Ambulatory Visit (HOSPITAL_COMMUNITY): Payer: Self-pay

## 2023-07-16 NOTE — Telephone Encounter (Signed)
 Pharmacy Patient Advocate Encounter   Received notification from CoverMyMeds that prior authorization for Nexium capsule is required/requested.   Insurance verification completed.   The patient is insured through [employer] .   Per test claim: xxx

## 2023-07-16 NOTE — Telephone Encounter (Signed)
 Jennifer [name]  Please just fax to [phone] and when Dr Laurey Morale is in the office I will get her to sign it

## 2023-07-16 NOTE — Telephone Encounter (Signed)
 Thank you! I have sent it over!

## 2023-07-17 ENCOUNTER — Encounter (HOSPITAL_BASED_OUTPATIENT_CLINIC_OR_DEPARTMENT_OTHER): Payer: Self-pay | Admitting: Physical Therapy

## 2023-07-17 ENCOUNTER — Ambulatory Visit (HOSPITAL_BASED_OUTPATIENT_CLINIC_OR_DEPARTMENT_OTHER): Payer: Medicare Other | Admitting: Physical Therapy

## 2023-07-17 DIAGNOSIS — M5459 Other low back pain: Secondary | ICD-10-CM

## 2023-07-17 DIAGNOSIS — M6281 Muscle weakness (generalized): Secondary | ICD-10-CM

## 2023-07-17 DIAGNOSIS — R2689 Other abnormalities of gait and mobility: Secondary | ICD-10-CM | POA: Diagnosis not present

## 2023-07-17 NOTE — Telephone Encounter (Signed)
Oh okay! Thanks

## 2023-07-17 NOTE — Therapy (Signed)
 OUTPATIENT PHYSICAL THERAPY THORACOLUMBAR TREATMENT   Patient Name: Jennifer [name] MRN: [id number] DOB:10-06-1950, 73 y.o., female Today's Date: 07/17/2023  END OF SESSION:  PT End of Session - 07/17/23 1622     Visit Number 7    Number of Visits 14    Date for PT Re-Evaluation 08/04/23    Authorization Type medicare    Progress Note Due on Visit 10    PT Start Time 1615    PT Stop Time 1653    PT Time Calculation (min) 38 min    Behavior During Therapy [facility] for tasks assessed/performed              Past Medical History:  Diagnosis Date   Allergy    Anemia    Arthritis    Cataract    bil cataracts removed   Chronic chest pain    Chronic fatigue 02/03/2017   Clotting disorder (HCC)    Pulmonary Embolis 2010 & 2015   Colon polyps    inflamed partially ulcerated hyperplastic polyp   Diabetes (HCC) 09/24/2013   no meds   Diverticulosis    Dysphagia esophageal phase - chronic after 3 fundoplications 01/20/2014   Edema of both lower extremities    Esophageal stenosis    Stricture after fundoplication REDO   Fibromyalgia    GERD (gastroesophageal reflux disease)    Glaucoma suspect    Heart murmur    no problems    Hemorrhoids, internal, with bleeding 06/27/2017   History of hiatal hernia    Hypertension    IBS (irritable bowel syndrome)    Lactose intolerance    Mitral valve prolapse    OSA (obstructive sleep apnea)    on CPAP-Mild - Sleep study 2004   Pre-diabetes    Pulmonary embolus (HCC)    2010 and 12-2013   Pulmonary nodule 12/13/2022   seen on CT chest xray on 12/16/22 - left upper lobe   Sleep apnea    wears CPAP   Thyroid nodule      dr. watching    Tinnitus    Subjective   [place]oster 2014   R flank   Past Surgical History:  Procedure Laterality Date   ABDOMINAL HYSTERECTOMY     still has 1 ovary    APPENDECTOMY     BRAIN SURGERY  1995   Stem surgery, Arnold Chiari Malformation   CATARACT EXTRACTION Bilateral 2017   cataracts, Dr. Elmer Picker    CHOLECYSTECTOMY     COLONOSCOPY     ESOPHAGEAL DILATION N/A 02/06/2023   Procedure: ESOPHAGEAL DILATION;  Surgeon: Corliss Skains, MD;  Location: [facility] OR;  Service: Thoracic;  Laterality: N/A;   ESOPHAGEAL DILATION N/A 02/13/2023   Procedure: ESOPHAGEAL DILATION;  Surgeon: Corliss Skains, MD;  Location: [facility] OR;  Service: Thoracic;  Laterality: N/A;   ESOPHAGEAL DILATION N/A 03/12/2023   Procedure: ESOPHAGEAL DILATION;  Surgeon: Corliss Skains, MD;  Location: [facility] OR;  Service: Thoracic;  Laterality: N/A;   ESOPHAGEAL MANOMETRY N/A 08/06/2019   Procedure: ESOPHAGEAL MANOMETRY (EM);  Surgeon: Iva Boop, MD;  Location: [facility];  Service: Endoscopy;  Laterality: N/A;   ESOPHAGOGASTRODUODENOSCOPY N/A 02/06/2023   Procedure: ESOPHAGOGASTRODUODENOSCOPY (EGD);  Surgeon: Corliss Skains, MD;  Location: [facility] OR;  Service: Thoracic;  Laterality: N/A;  With Savary dilation   ESOPHAGOGASTRODUODENOSCOPY N/A 02/13/2023   Procedure: ESOPHAGOGASTRODUODENOSCOPY (EGD);  Surgeon: Corliss Skains, MD;  Location: [facility] OR;  Service: Thoracic;  Laterality: N/A;   ESOPHAGOGASTRODUODENOSCOPY N/A 03/12/2023  Procedure: ESOPHAGOGASTRODUODENOSCOPY (EGD);  Surgeon: Corliss Skains, MD;  Location: [facility] OR;  Service: Thoracic;  Laterality: N/A;   HEMORRHOID BANDING     HERNIA REPAIR     Hital Hernia   JOINT REPLACEMENT Right    knee   KNEE ARTHROSCOPY  05/16/2012   Procedure: ARTHROSCOPY KNEE;  Surgeon: Kennieth Rad, MD;  Location: [facility] OR;  Service: Orthopedics;  Laterality: Left;   KNEE ARTHROSCOPY Right 01/2018   NISSEN FUNDOPLICATION     X 3 lab, open x 2 last with mesh   POLYPECTOMY     REDUCTION MAMMAPLASTY Bilateral 2007   TOTAL KNEE ARTHROPLASTY Right 08/25/2019   Procedure: RIGHT TOTAL KNEE ARTHROPLASTY;  Surgeon: Ollen Gross, MD;  Location: [facility] ORS;  Service: Orthopedics;  Laterality: Right;    UPPER GASTROINTESTINAL ENDOSCOPY     Patient Active Problem List    Diagnosis Date Noted   Vitamin D deficiency 12/12/2022   Decreased GFR 12/12/2022   Physically inactive 12/12/2022   Calculus of kidney 07/14/2021   OSA on CPAP 06/08/2021   Circadian rhythm sleep disorder, delayed sleep phase type 06/08/2021   Glaucoma suspect 06/25/2020   S/P total knee arthroplasty, right 08/27/2019   OA (osteoarthritis) of knee 08/25/2019   Primary osteoarthritis of right knee 08/25/2019   Atypical chest pain    Hemorrhoids, internal, with bleeding 06/27/2017   IDA (iron deficiency anemia) 02/20/2017   Chronic fatigue 02/03/2017   Left leg pain 11/29/2016   History of colonic polyps 12/02/2015   PCP NOTES >>>>>>>>>>>>>>>>>>>>>>>>> 02/13/2015   Anxiety 08/04/2014   Annual physical exam 04/29/2014   Fibromyalgia syndrome 02/04/2014   Dysphagia 01/20/2014   Chronic cough 01/08/2014   DOE (dyspnea on exertion) 12/26/2013   Diabetes (HCC) 09/24/2013   Abdominal pain, chronic, right flank -upper quadrant 07/02/2012   Class 2 severe obesity with serious comorbidity and body mass index (BMI) of 38.0 to 38.9 in adult [facility]) 09/30/2009   Hypersomnia with sleep apnea 09/28/2009   Essential hypertension 06/24/2009   DIZZINESS 06/24/2009   HEADACHE 06/24/2009   VENOUS INSUFFICIENCY, LEGS 11/19/2008   Acute pulmonary embolism (HCC) 05/19/2008   Pulmonary nodule 08/26/2007   Stricture and stenosis of esophagus 05/22/2007   GERD 01/31/2007   IBS (irritable bowel syndrome) 01/31/2007   MITRAL VALVE PROLAPSE, HX OF 01/31/2007    PCP: Willow Ora MD  REFERRING PROVIDER: Venita Lick, MD   REFERRING DIAG: M54.50 (ICD-10-CM) - Low back pain   Rationale for Evaluation and Treatment: Rehabilitation  THERAPY DIAG:  Other low back pain  Muscle weakness (generalized)  Other abnormalities of gait and mobility  ONSET DATE: Last 2 yrs  SUBJECTIVE:  SUBJECTIVE STATEMENT: Pt reports she is beginning to notice the strength in her LE and back.  She did not take any pain medication prior to session to assess response to exercise in water without meds.   POOL ACCESS: currently none, but considering membership at [facility] or elsewhere.  Initial subjective Flare of LBP that has stayed for 3 weeks this time.  Thought it was my bed ??.  Using the pain patches every once in a while and they help. I wake up with pain. R TKR ~ 5 yrs ago.   PERTINENT HISTORY:   Lumbar spondylosis  PAIN:  Are you having pain? Yes: NPRS scale: current 3/10 Pain location: LB Pain description: ache Aggravating factors: sitting >30 minutes, lying on left side or too long on back Relieving factors: heating pad, pain patches  PRECAUTIONS: None  RED FLAGS: None   WEIGHT BEARING RESTRICTIONS: No  FALLS:  Has patient fallen in last 6 months? No   OCCUPATION: retired; Clinical cytogeneticist  PLOF: Independent  PATIENT GOALS: improve strength, get back into exercise.  NEXT MD VISIT: as needed  OBJECTIVE:  Note: Objective measures were completed at Evaluation unless otherwise noted.  DIAGNOSTIC FINDINGS:  Imaging from today does demonstrate mild degenerative disc disease at L4-L5 and L5-S1   PATIENT SURVEYS:  Modified Oswestry 15/45   COGNITION: Overall cognitive status: Within functional limits for tasks assessed     SENSATION: WFL  MUSCLE LENGTH: Hamstrings: Bilateral hamstring    POSTURE: rounded shoulders, forward head, and left knee valgus  PALPATION: Center l4-5; L5-S1  LUMBAR ROM:  wfl  LOWER EXTREMITY ROM:     wfl  LOWER EXTREMITY MMT:    MMT Right eval Left eval  Hip flexion 40.6 37.1  Hip extension    Hip abduction 27.4 26.7  Hip adduction    Hip internal rotation    Hip external rotation    Knee flexion    Knee extension 47.1 29.3  Ankle dorsiflexion    Ankle  plantarflexion    Ankle inversion    Ankle eversion     (Blank rows = not tested)  LUMBAR SPECIAL TESTS:  Slump test: Negative  FUNCTIONAL TESTS:  30 s STS 4 from pool bench with ue assist (norm 10-14 ) Timed up and go (TUG): 14.59 4 stage balance: passed 1&2; Tandem x 10s (unsteady); SLS x 6s  GAIT: Distance walked: 473ft Assistive device utilized: None Level of assistance: Complete Independence Comments: Left knee valgus knocking right.  TREATMENT  [facility] Adult PT Treatment:                                                DATE: 07/17/23 Pt seen for aquatic therapy today.  Treatment took place in water 3.5-4.75 ft in depth at the [facility] pool. Temp of water was 91.  Pt entered/exited the pool via stairs with hand rail.   *unsupported: walking forward/ backward in 4+ ft of water 4 laps,  * side stepping with arm addct/ abdc with rainbow hand floats x 2 laps * wide stance with reciprocal arm swing with medium resistance bells; horiz abdct/ addct  * marching forward with alternating row motion 2 x 20 each with medium resistance bells - walking forward with arm swing * TrA set with 1/2 hollow noodle pull down to thighs x 10 -> marching forward with noodle knee  taps * UE on wall:  hip abdct/ addct x 10 each -> bilat knee to chest stretch with feet in bottom hole of ladder -> return to wall to finish 2nd set of hip abdct; single leg clams x 10 each LE * Straddled square noodle, extra noodle under arms behind back: cycling multiple laps   Pt requires the buoyancy and hydrostatic pressure of water for support, and to offload joints by unweighting joint load by at least 50 % in navel deep water and by at least 75-80% in chest to neck deep water.  Viscosity of the water is needed for resistance of strengthening. Water current perturbations provides challenge to standing balance requiring increased core activation.  [facility] Adult PT Treatment:                                                 DATE: 07/13/23 Pt seen for aquatic therapy today.  Treatment took place in water 3.5-4.75 ft in depth at the [facility] pool. Temp of water was 91.  Pt entered/exited the pool via stairs with hand rail.   *unsupported: walking forward/ backward in 4+ ft of water 4 laps, cues for neutral R foot and even step length * side stepping with arm addct/ abdc -cues to allow soft knees x 1 lap-> with rainbow hand floats x 2 laps * wide stance with reciprocal arm swing with medium resistance bells; horiz abdct/ addct  * marching forward with alternating row motion with medium resistance bells  * UE on wall:  hip abdct/ addct x 10 each; single leg clams x 10 each LE * TrA set with 1/2 hollow noodle pull down to thighs x 10 * Straddled square noodle, extra noodle under arms behind back: cycling    [facility] Adult PT Treatment:                                                DATE: 07/10/23 Therapeutic Exercise: Standing hip abduction AROM x8 BIL Standing heel/toe raises x8 cues for posture Standing hip extension AROM x8 BIL HEP establishment + education/handout  Neuromuscular re-ed: Hooklying PPT w/ ta activation 2x8 cues for breath control and comfortable ROM  (increased time to work on compensations) Seated hip adduction isometric w/ core contraction 2x8 cues for breath control and posture   Therapeutic Activity: Sidestepping along mat 3 laps Fwd/retro walking along mat 3 laps    [facility] Adult PT Treatment:                                                DATE: 07/05/23 Pt seen for aquatic therapy today.  Treatment took place in water 3.5-4.75 ft in depth at the [facility] pool. Temp of water was 91.  Pt entered/exited the pool via stairs with hand rail.   *unsupported: walking forward/ backward in 4+ ft of water 4 laps * side stepping with arm addct/ abdc -cues to allow soft knees x 1 lap-> with rainbow hand floats x 3 laps * marching forward/backward with alternating row motion  with rainbow hand floats  * TrA set  with 1/2 hollow noodle pull down to thighs x 8 * hip hinge with forward arm reach, UE on yellow hand floats x 8 (some pain) * UE on wall:  hip abdct/ addct x 10 each; hip ext to toe touch x 10 each; single leg clams x 5 each LE * Straddled square noodle, extra noodle under arms behind back: cycling with doggie paddle arms   Pt requires the buoyancy and hydrostatic pressure of water for support, and to offload joints by unweighting joint load by at least 50 % in navel deep water and by at least 75-80% in chest to neck deep water.  Viscosity of the water is needed for resistance of strengthening. Water current perturbations provides challenge to standing balance requiring increased core activation.                                                                                                                           PATIENT EDUCATION:  Education details: rationale for interventions Person educated: Patient Education method: Explanation, Demonstration, Tactile cues, Verbal cues Education comprehension: verbalized understanding, returned demonstration, verbal cues required, tactile cues required, and needs further education     HOME EXERCISE PROGRAM: Access Code: [email or web address] URL: [email or web address] Date: 07/10/2023 Prepared by: Fransisco Hertz  Exercises - Seated Hip Adduction Isometrics with Newman Pies  - 2-3 x daily - 1 sets - 8-10 reps - Heel Raises with Counter Support  - 2-3 x daily - 1 sets - 8-10 reps - Standing Hip Extension with Counter Support  - 2-3 x daily - 1 sets - 8-10 reps  ASSESSMENT:  CLINICAL IMPRESSION: Pt with some increased LB discomfort with hip abduction - up to 5/10.  Overall, pt reports decreased intensity of pain while exercising in water, especially with suspended cycling. Pt has met STG2 and is making gradual progress towards goals.     Initial impression Patient is a 73 y.o. f who was seen today for physical  therapy evaluation and treatment for LBP.  She presents today with moderate pain sensitivity in focal area midline spine at L4-5- s1.  Pain reportedly interferes in daily activities and sleeping at night occasionally. Pt is not very active by own admission. Testing indicates strength deficits, she has difficulty rising from a seated position and some balance deficits evident mostly with transitional movements.  She will benefit from skilled PT intervention.  She does not and likely will not have pool access so we will begin with aquatic sessions for pain management and to initiate muscle lengthening/strengthening and balance then incorporate land based intervention for added load bearing strengthening and final HEP for continued management of chronic condition  OBJECTIVE IMPAIRMENTS: Abnormal gait, decreased activity tolerance, decreased balance, impaired flexibility, and pain.   ACTIVITY LIMITATIONS: sleeping, stairs, transfers, and locomotion level  PARTICIPATION LIMITATIONS: shopping and community activity  PERSONAL FACTORS: Fitness are also affecting patient's functional outcome.   REHAB POTENTIAL: Good  CLINICAL DECISION MAKING: Evolving/moderate complexity  EVALUATION COMPLEXITY: Moderate  GOALS: Goals reviewed with patient? Yes  SHORT TERM GOALS: Target date: 07/07/23  Pt will tolerate full aquatic sessions consistently without increase in pain and with improving function to demonstrate good toleration and effectiveness of intervention.  Baseline: 07/10/23: reports improving tolerance w/ aquatics Goal status: MET  2. Pt will report walking x 15 minutes 5/7 days a week to demonstrate committment to increasing her physical activity Baseline: walking 30 min (on walking pad)  Goal status: MET - 07/17/23  3.  Pt will report a reduction of LBP by at least 25% Baseline: see chart 07/10/23: continues to endorse fluctuations Goal status: ONGOING   LONG TERM GOALS: Target date:  08/04/23  Pt to improve on ODI by  20 % to demonstrate statistically significant Improvement in function. Baseline: 33% Goal status: INITIAL  2.  Pt will be indep with final HEP's (land and aquatic as appropriate) for continued management of condition Baseline:  Goal status: INITIAL  3.  Pt will report decrease in pain by at least 75% for improved toleration to activity/quality of life and to demonstrate improved management of pain.  Baseline: see chart Goal status: INITIAL  4.  Pt will improve strength in LE up towards 10lbs to demonstrate improved overall physical function Baseline: see chart Goal status: INITIAL  5.  Pt will improve on 30s STS test to <or= 8 to demonstrate improving functional lower extremity strength, transitional movements, and balance Baseline: 4 Goal status: INITIAL  6.  Pt will improve on Tug test to <or=13s (norm) to demonstrate improvement in lower extremity function, mobility and decreased fall risk. Baseline: 14.59 Goal status: INITIAL  PLAN:  PT FREQUENCY: 2x/week  PT DURATION: 6 weeks  PLANNED INTERVENTIONS: 97164- PT Re-evaluation, 97110-Therapeutic exercises, 97530- Therapeutic activity, 97112- Neuromuscular re-education, 97535- Self Care, 16109- Manual therapy, [phone]- Gait training, [phone]- Orthotic Fit/training, [phone]- Aquatic Therapy, [phone]- Ionotophoresis 4mg /ml Dexamethasone, Patient/Family education, Balance training, Stair training, Taping, Dry Needling, Joint mobilization, Cryotherapy, and Moist heat.  PLAN FOR NEXT SESSION: aquatic sessions for pain management and to initiate muscle lengthening/strengthening and balance then incorporate land based intervention for added load bearing strengthening and final HEP for continued management of chronic condition  Mayer Camel, PTA 07/17/23 5:04 PM [facility] [address] [place], [place], [address] Phone: [phone]   Fax:   [phone]

## 2023-07-17 NOTE — Telephone Encounter (Signed)
 It's completely alright! We got it all done and the request has been faxed out to the insurance

## 2023-07-17 NOTE — Telephone Encounter (Signed)
 Morrie Sheldon, I left the office for an appointment yesterday and never seen your fax request for Dr Lavon Paganini to sign. Im here till 5 today can you refax to [phone] Attention to me. We have 1 fax machine that wants to keep acting up. Thanks Roney Marion so sorry its [phone]

## 2023-07-18 NOTE — Telephone Encounter (Signed)
 Patient called and said she only has two pills left of the Nexium. I did tell her it take 48-72 hours for a response. As soon as you guys from the Rx team find out let me know.  She has tried and failed everything as far as a PPI she said.   Thanks  Gar Gibbon

## 2023-07-18 NOTE — Telephone Encounter (Signed)
 Patient called to follow up on Nexium medication stated she only has two days left. Requested a call back to let her know how long this will take.

## 2023-07-18 NOTE — Telephone Encounter (Signed)
 This was just submitted yesterday, it takes 48 to 72 hours to make a decision

## 2023-07-19 ENCOUNTER — Other Ambulatory Visit: Payer: Medicare Other

## 2023-07-19 NOTE — Telephone Encounter (Signed)
 Pharmacy Patient Advocate Encounter  Received notification from [facility] that Prior Authorization for Nexium 40MG  capsule Jennifer [name] has been APPROVED from 04-25-2023 to 07-17-2024   PA #/Case ID/Reference #: [id number]

## 2023-07-19 NOTE — Telephone Encounter (Signed)
 Called and left message for patient that her Nexium was approved and to contact her pharmacy to get them to rerun the prescription

## 2023-07-20 ENCOUNTER — Encounter (HOSPITAL_BASED_OUTPATIENT_CLINIC_OR_DEPARTMENT_OTHER): Payer: Medicare Other | Admitting: Physical Therapy

## 2023-07-23 ENCOUNTER — Other Ambulatory Visit: Payer: Self-pay | Admitting: Hematology & Oncology

## 2023-07-24 ENCOUNTER — Ambulatory Visit (HOSPITAL_BASED_OUTPATIENT_CLINIC_OR_DEPARTMENT_OTHER): Payer: Medicare Other | Admitting: Physical Therapy

## 2023-07-24 ENCOUNTER — Ambulatory Visit (INDEPENDENT_AMBULATORY_CARE_PROVIDER_SITE_OTHER): Payer: Medicare Other | Admitting: "Endocrinology

## 2023-07-24 ENCOUNTER — Encounter: Payer: Self-pay | Admitting: "Endocrinology

## 2023-07-24 VITALS — BP 128/80 | HR 71 | Resp 16 | Ht 68.0 in | Wt 237.4 lb

## 2023-07-24 DIAGNOSIS — E041 Nontoxic single thyroid nodule: Secondary | ICD-10-CM | POA: Diagnosis not present

## 2023-07-24 NOTE — Telephone Encounter (Signed)
 Patient is called back and stated that she would like to let Zella Ball know that she did receive her medication and she is so thank you to Robin. Please advise.

## 2023-07-24 NOTE — Progress Notes (Signed)
 Outpatient Endocrinology Note Jennifer [place], MD  07/24/23   Jennifer [name] 10-Dec-1950 [id number]  Referring Provider: Napoleon Form, MD Primary Care Provider: Wanda Plump, MD Subjective  No chief complaint on file.   Assessment & Plan  Diagnoses and all orders for this visit:  Thyroid nodule -     US THYROID; Future -     US THYROID    [name] is currently taking no thyroid medication. Ordered baseline thyroid labs  History of many nodular goiter, last ultrasound in 05/2020 reported stable thyroid nodule Ordered follow-up thyroid ultrasound  I have reviewed current medications, nurse's notes, allergies, vital signs, past medical and surgical history, family medical history, and social history for this encounter. Counseled patient on symptoms, examination findings, lab findings, imaging results, treatment decisions and monitoring and prognosis. The patient understood the recommendations and agrees with the treatment plan. All questions regarding treatment plan were fully answered.   Return in about 6 weeks (around 09/04/2023) for visit, labs today.   Jennifer [place], MD  07/24/23   I have reviewed current medications, nurse's notes, allergies, vital signs, past medical and surgical history, family medical history, and social history for this encounter. Counseled patient on symptoms, examination findings, lab findings, imaging results, treatment decisions and monitoring and prognosis. The patient understood the recommendations and agrees with the treatment plan. All questions regarding treatment plan were fully answered.   History of Present Illness Jennifer [name] is a 73 y.o. year old female who presents to our clinic with thyroid nodule diagnosed around 2015.    Symptoms suggestive of HYPOTHYROIDISM:  fatigue Yes weight gain No cold intolerance  Yes constipation  Yes-ha IBS  Symptoms suggestive of HYPERTHYROIDISM:  weight loss  No heat intolerance  No hyperdefecation  No palpitations  No  Compressive symptoms:  dysphagia  No, gets esophagus dilated q1mo due to esophageal stricture dysphonia  No positional dyspnea (especially with simultaneous arms elevation)  No  Smokes  No On biotin  No Personal history of head/neck surgery/irradiation  Yes< history of brain surgery in s at [facility] due to arnold chiari malformation   CLINICAL DATA:  Goiter. History of left-sided thyroid nodules previously biopsied in 2014 with benign pathologic results.   05/2020 EXAM: THYROID ULTRASOUND   TECHNIQUE: Ultrasound examination of the thyroid gland and adjacent soft tissues was performed.   COMPARISON:  Prior thyroid ultrasound 05/19/2019   FINDINGS: Parenchymal Echotexture: Mildly heterogenous   Isthmus: 0.2 cm   Right lobe: 5.6 x 1.8 x 1.7 cm   Left lobe: 4.6 x 2.3 x 2.1 cm   _________________________________________________________   Estimated total number of nodules >/= 1 cm: 1   Number of spongiform nodules >/=  2 cm not described below (TR1): 0   Number of mixed cystic and solid nodules >/= 1.5 cm not described below (TR2): 0   _________________________________________________________   No significant interval change in the size or appearance of the previously biopsied thyroid nodule which measures 2.3 x 2.3 x 2.1 cm compared to 2.4 x 2.0 x 1.9 cm previously.   No additional thyroid nodules identified.   IMPRESSION: Stable previously biopsied right-sided thyroid nodule.   No new thyroid nodules.  Physical Exam  BP 128/80 (BP Location: Left Arm, Patient Position: Sitting, Cuff Size: Normal)   Pulse 71   Resp 16   Ht 5\' 8"  (1.727 m)   Wt 237 lb 6.4 oz (107.7 kg)   SpO2 98%   BMI  36.10 kg/m  Constitutional: well developed, well nourished Head: normocephalic, atraumatic, no exophthalmos Eyes: sclera anicteric, no redness Neck: + thyromegaly, - thyroid tenderness; + nodules palpate Lungs: normal respiratory  effort Neurology: alert and oriented, - fine hand tremor Skin: dry, no appreciable rashes Musculoskeletal: no appreciable defects Psychiatric: normal mood and affect  Allergies Allergies  Allergen Reactions   Doxepin Nausea And Vomiting and Other (See Comments)    "Feeling loopy"   Morphine And Codeine Anaphylaxis and Shortness Of Breath   Sulfonamide Derivatives Hives    Burn from inside out   Promethazine Hcl Other (See Comments)    Hallucinations    Current Medications Patient's Medications  New Prescriptions   No medications on file  Previous Medications   ACETAMINOPHEN (TYLENOL) 500 MG TABLET    Take 1 tablet (500 mg total) by mouth every 6 (six) hours as needed.   APOAEQUORIN (PREVAGEN) 10 MG CAPS    Take 10 mg by mouth daily. prevagen   CYCLOSPORINE (RESTASIS) 0.05 % OPHTHALMIC EMULSION    Place 1 drop into both eyes 2 (two) times daily.   FLUTICASONE (FLONASE) 50 MCG/ACT NASAL SPRAY    Place 2 sprays into both nostrils daily.   HYDROCORTISONE VALERATE CREAM (WESTCORT) 0.2 %    Apply 1 Application topically daily as needed (itching).   HYOSCYAMINE (LEVSIN) 0.125 MG TABLET    Take 1 tablet (0.125 mg total) by mouth every 4 (four) hours as needed.   MULTIPLE VITAMINS-MINERALS (MULTIVITAMIN WITH MINERALS) TABLET    Take 2 tablets by mouth daily. Vitafusion woman gummy   NEXIUM 40 MG CAPSULE    Take 1 capsule (40 mg total) by mouth daily before breakfast.   OLOPATADINE (PATADAY) 0.1 % OPHTHALMIC SOLUTION    Place 1 drop into the left eye 2 (two) times daily.   ROSUVASTATIN (CRESTOR) 10 MG TABLET    Take 1 tablet (10 mg total) by mouth at bedtime.   TEMAZEPAM (RESTORIL) 30 MG CAPSULE    Take 1 capsule (30 mg total) by mouth at bedtime as needed. for sleep   TIRZEPATIDE (MOUNJARO) 7.5 MG/0.5ML PEN    Inject 7.5 mg into the skin once a week.   TRIAMTERENE-HYDROCHLOROTHIAZIDE (MAXZIDE-25) 37.5-25 MG TABLET    Take 0.5 tablets by mouth daily.   XARELTO 2.5 MG TABS TABLET    TAKE TWO  TABLETS BY MOUTH DAILY  Modified Medications   No medications on file  Discontinued Medications   No medications on file    Past Medical History Past Medical History:  Diagnosis Date   Allergy    Anemia    Arthritis    Cataract    bil cataracts removed   Chronic chest pain    Chronic fatigue 02/03/2017   Clotting disorder (HCC)    Pulmonary Embolis 2010 & 2015   Colon polyps    inflamed partially ulcerated hyperplastic polyp   Diabetes (HCC) 09/24/2013   no meds   Diverticulosis    Dysphagia esophageal phase - chronic after 3 fundoplications 01/20/2014   Edema of both lower extremities    Esophageal stenosis    Stricture after fundoplication REDO   Fibromyalgia    GERD (gastroesophageal reflux disease)    Glaucoma suspect    Heart murmur    no problems    Hemorrhoids, internal, with bleeding 06/27/2017   History of hiatal hernia    Hypertension    IBS (irritable bowel syndrome)    Lactose intolerance    Mitral valve prolapse  OSA (obstructive sleep apnea)    on CPAP-Mild - Sleep study 2004   Pre-diabetes    Pulmonary embolus (HCC)    2010 and 12-2013   Pulmonary nodule 12/13/2022   seen on CT chest xray on 12/16/22 - left upper lobe   Sleep apnea    wears CPAP   Thyroid nodule      dr. watching    Tinnitus    Subjective   Zoster 2014   R flank    Past Surgical History Past Surgical History:  Procedure Laterality Date   ABDOMINAL HYSTERECTOMY     still has 1 ovary    APPENDECTOMY     BRAIN SURGERY  1995   Stem surgery, Arnold Chiari Malformation   CATARACT EXTRACTION Bilateral 2017   cataracts, Dr. Elmer Picker   CHOLECYSTECTOMY     COLONOSCOPY     ESOPHAGEAL DILATION N/A 02/06/2023   Procedure: ESOPHAGEAL DILATION;  Surgeon: Corliss Skains, MD;  Location: [facility] OR;  Service: Thoracic;  Laterality: N/A;   ESOPHAGEAL DILATION N/A 02/13/2023   Procedure: ESOPHAGEAL DILATION;  Surgeon: Corliss Skains, MD;  Location: [facility] OR;  Service: Thoracic;   Laterality: N/A;   ESOPHAGEAL DILATION N/A 03/12/2023   Procedure: ESOPHAGEAL DILATION;  Surgeon: Corliss Skains, MD;  Location: [facility] OR;  Service: Thoracic;  Laterality: N/A;   ESOPHAGEAL MANOMETRY N/A 08/06/2019   Procedure: ESOPHAGEAL MANOMETRY (EM);  Surgeon: Iva Boop, MD;  Location: [facility];  Service: Endoscopy;  Laterality: N/A;   ESOPHAGOGASTRODUODENOSCOPY N/A 02/06/2023   Procedure: ESOPHAGOGASTRODUODENOSCOPY (EGD);  Surgeon: Corliss Skains, MD;  Location[facility] OR;  Service: Thoracic;  Laterality: N/A;  With Savary dilation   ESOPHAGOGASTRODUODENOSCOPY N/A 02/13/2023   Procedure: ESOPHAGOGASTRODUODENOSCOPY (EGD);  Surgeon: Corliss Skains, MD;  Location: [facility] OR;  Service: Thoracic;  Laterality: N/A;   ESOPHAGOGASTRODUODENOSCOPY N/A 03/12/2023   Procedure: ESOPHAGOGASTRODUODENOSCOPY (EGD);  Surgeon: Corliss Skains, MD;  Location: [facility] OR;  Service: Thoracic;  Laterality: N/A;   HEMORRHOID BANDING     HERNIA REPAIR     Hital Hernia   JOINT REPLACEMENT Right    knee   KNEE ARTHROSCOPY  05/16/2012   Procedure: ARTHROSCOPY KNEE;  Surgeon: Kennieth Rad, MD;  Location: [facility] OR;  Service: Orthopedics;  Laterality: Left;   KNEE ARTHROSCOPY Right 01/2018   NISSEN FUNDOPLICATION     X 3 lab, open x 2 last with mesh   POLYPECTOMY     REDUCTION MAMMAPLASTY Bilateral 2007   TOTAL KNEE ARTHROPLASTY Right 08/25/2019   Procedure: RIGHT TOTAL KNEE ARTHROPLASTY;  Surgeon: Ollen Gross, MD;  Location: [facility] ORS;  Service: Orthopedics;  Laterality: Right;    UPPER GASTROINTESTINAL ENDOSCOPY      Family History family history includes Alcoholism in her father; Arthritis in her sister; CAD in her sister; Colon cancer in her maternal grandmother; Crohn's disease in her maternal aunt; Diabetes in her mother; Fibromyalgia in her sister; Heart disease in her father; Hypertension in her father, mother, and sister; Lung cancer in her maternal uncle; Rheum arthritis in her  mother, sister, sister, and sister; Stomach cancer in her maternal uncle; Sudden death in her father.  Social History Social History   Socioeconomic History   Marital status: Married    Spouse name: Tawni Carnes   Number of children: 1   Years of education: Not on file   Highest education level: Associate degree: occupational, Scientist, product/process development, or vocational program  Occupational History   Occupation: retired form the [employer] 04-2015  Employer: [employer]  Tobacco Use   Smoking status: Never   Smokeless tobacco: Never   Tobacco comments:    never used tobacco  Vaping Use   Vaping status: Never Used  Substance and Sexual Activity   Alcohol use: No    Alcohol/week: 0.0 standard drinks of alcohol   Drug use: No   Sexual activity: Not Currently    Birth control/protection: Surgical    Comment: Hysterectomy  Other Topics Concern   Not on file  Social History Narrative   [employer] Graduate - Technical school   Married '70 for 2 years, divorced; re-married '86 for 2.5 years, divorced; re-married '90 for 1 year, divorced; re-married '00   1 son - '37   Sister - Died @ 38 [place]       Social Drivers of Corporate investment banker Strain: Low Risk  (06/07/2023)   Overall Financial Resource Strain (CARDIA)    Difficulty of Paying Living Expenses: Not hard at all  Food Insecurity: No Food Insecurity (06/07/2023)   Hunger Vital Sign    Worried About Running Out of Food in the Last Year: Never true    Ran Out of Food in the Last Year: Never true  Transportation Needs: No Transportation Needs (06/07/2023)   PRAPARE - Administrator, Civil Service (Medical): No    Lack of Transportation (Non-Medical): No  Physical Activity: Insufficiently Active (06/07/2023)   Exercise Vital Sign    Days of Exercise per Week: 2 days    Minutes of Exercise per Session: 20 min  Stress: No Stress Concern Present (06/07/2023)   [employer] of Occupational Health - Occupational Stress  Questionnaire    Feeling of Stress : Not at all  Social Connections: Socially Integrated (06/07/2023)   Social Connection and Isolation Panel [NHANES]    Frequency of Communication with Friends and Family: More than three times a week    Frequency of Social Gatherings with Friends and Family: Once a week    Attends Religious Services: More than 4 times per year    Active Member of Clubs or Organizations: Yes    Attends Banker Meetings: 1 to 4 times per year    Marital Status: Married  Catering manager Violence: Not At Risk (08/28/2022)   Humiliation, Afraid, Rape, and Kick questionnaire    Fear of Current or Ex-Partner: No    Emotionally Abused: No    Physically Abused: No    Sexually Abused: No    Laboratory Investigations Lab Results  Component Value Date   TSH 4.110 12/12/2022   TSH 2.85 04/26/2021   TSH 2.01 05/07/2018   FREET4 0.71 05/25/2016   FREET4 0.86 04/29/2014   FREET4 0.74 05/26/2010     No results found for: "TSI"   No components found for: "TRAB"   Lab Results  Component Value Date   CHOL 115 06/08/2023   Lab Results  Component Value Date   HDL 67 06/08/2023   Lab Results  Component Value Date   LDLCALC 34 06/08/2023   Lab Results  Component Value Date   TRIG 64 06/08/2023   Lab Results  Component Value Date   CHOLHDL 1.7 06/08/2023   Lab Results  Component Value Date   CREATININE 0.98 06/18/2023   Lab Results  Component Value Date   GFR 58.20 (L) 06/05/2022      Component Value Date/Time   NA 142 06/18/2023 1437   NA 143 12/12/2022 1100  NA 143 02/16/2017 1450   NA 142 01/19/2016 1141   K 4.1 06/18/2023 1437   K 3.7 02/16/2017 1450   K 3.7 01/19/2016 1141   CL 104 06/18/2023 1437   CL 105 02/16/2017 1450   CO2 31 06/18/2023 1437   CO2 32 02/16/2017 1450   CO2 27 01/19/2016 1141   GLUCOSE 85 06/18/2023 1437   GLUCOSE 88 02/16/2017 1450   BUN 20 06/18/2023 1437   BUN 17 12/12/2022 1100   BUN 14 02/16/2017 1450    BUN 19.5 01/19/2016 1141   CREATININE 0.98 06/18/2023 1437   CREATININE 1.2 02/16/2017 1450   CREATININE 0.9 01/19/2016 1141   CALCIUM 9.5 06/18/2023 1437   CALCIUM 8.8 02/16/2017 1450   CALCIUM 8.8 01/19/2016 1141   PROT 7.2 06/18/2023 1437   PROT 7.1 12/12/2022 1100   PROT 7.3 02/16/2017 1450   PROT 7.3 01/19/2016 1141   ALBUMIN 4.1 06/18/2023 1437   ALBUMIN 4.3 12/12/2022 1100   ALBUMIN 3.6 01/19/2016 1141   AST 26 06/18/2023 1437   AST 21 01/19/2016 1141   ALT 32 06/18/2023 1437   ALT 27 02/16/2017 1450   ALT 18 01/19/2016 1141   ALKPHOS 51 06/18/2023 1437   ALKPHOS 57 02/16/2017 1450   ALKPHOS 59 01/19/2016 1141   BILITOT 0.4 06/18/2023 1437   BILITOT 0.31 01/19/2016 1141   GFRNONAA >60 06/18/2023 1437   GFRAA >60 12/25/2019 1353      Latest Ref Rng & Units 06/18/2023    2:37 PM 03/16/2023    2:54 PM 03/12/2023    7:21 AM  BMP  Glucose 70 - 99 mg/dL 85  161  096   BUN 8 - 23 mg/dL 20  20  14    Creatinine 0.44 - 1.00 mg/dL 0.45  4.09  8.11   Sodium 135 - 145 mmol/L 142  144  138   Potassium 3.5 - 5.1 mmol/L 4.1  4.1  3.8   Chloride 98 - 111 mmol/L 104  105  105   CO2 22 - 32 mmol/L 31  32  24   Calcium 8.9 - 10.3 mg/dL 9.5  9.9  8.5        Component Value Date/Time   WBC 4.5 06/18/2023 1437   WBC 4.5 03/12/2023 0721   RBC 4.24 06/18/2023 1437   HGB 12.1 06/18/2023 1437   HGB 12.1 02/16/2017 1450   HCT 37.1 06/18/2023 1437   HCT 36.6 02/16/2017 1450   PLT 209 06/18/2023 1437   PLT 239 02/16/2017 1450   MCV 87.5 06/18/2023 1437   MCV 87 02/16/2017 1450   MCH 28.5 06/18/2023 1437   MCHC 32.6 06/18/2023 1437   RDW 15.0 06/18/2023 1437   RDW 15.4 02/16/2017 1450   LYMPHSABS 1.9 06/18/2023 1437   LYMPHSABS 1.9 02/16/2017 1450   MONOABS 0.6 06/18/2023 1437   EOSABS 0.1 06/18/2023 1437   EOSABS 0.2 02/16/2017 1450   BASOSABS 0.0 06/18/2023 1437   BASOSABS 0.0 02/16/2017 1450      Parts of this note may have been dictated using voice recognition  software. There may be variances in spelling and vocabulary which are unintentional. Not all errors are proofread. Please notify the Thereasa Parkin if any discrepancies are noted or if the meaning of any statement is not clear.

## 2023-07-25 ENCOUNTER — Ambulatory Visit (HOSPITAL_BASED_OUTPATIENT_CLINIC_OR_DEPARTMENT_OTHER): Attending: Orthopedic Surgery | Admitting: Physical Therapy

## 2023-07-25 ENCOUNTER — Encounter (HOSPITAL_BASED_OUTPATIENT_CLINIC_OR_DEPARTMENT_OTHER): Payer: Self-pay | Admitting: Physical Therapy

## 2023-07-25 DIAGNOSIS — M6281 Muscle weakness (generalized): Secondary | ICD-10-CM | POA: Diagnosis not present

## 2023-07-25 DIAGNOSIS — R2689 Other abnormalities of gait and mobility: Secondary | ICD-10-CM | POA: Diagnosis not present

## 2023-07-25 DIAGNOSIS — M5459 Other low back pain: Secondary | ICD-10-CM

## 2023-07-25 LAB — T4, FREE: Free T4: 1.3 ng/dL (ref 0.8–1.8)

## 2023-07-25 LAB — T3, FREE: T3, Free: 3 pg/mL (ref 2.3–4.2)

## 2023-07-25 LAB — TSH: TSH: 1.82 m[IU]/L (ref 0.40–4.50)

## 2023-07-25 NOTE — Telephone Encounter (Signed)
 Noted. Thank You.

## 2023-07-25 NOTE — Therapy (Signed)
 OUTPATIENT PHYSICAL THERAPY THORACOLUMBAR TREATMENT   Patient Name: Jennifer [name] MRN: [id number] DOB:1950/10/03, 73 y.o., female Today's Date: 07/25/2023  END OF SESSION:  PT End of Session - 07/25/23 1620     Visit Number 8    Number of Visits 14    Date for PT Re-Evaluation 08/04/23    Authorization Type medicare    Progress Note Due on Visit 10    PT Start Time 1616    PT Stop Time 1655    PT Time Calculation (min) 39 min    Activity Tolerance Patient tolerated treatment well    Behavior During Therapy [facility] for tasks assessed/performed              Past Medical History:  Diagnosis Date   Allergy    Anemia    Arthritis    Cataract    bil cataracts removed   Chronic chest pain    Chronic fatigue 02/03/2017   Clotting disorder (HCC)    Pulmonary Embolis 2010 & 2015   Colon polyps    inflamed partially ulcerated hyperplastic polyp   Diabetes (HCC) 09/24/2013   no meds   Diverticulosis    Dysphagia esophageal phase - chronic after 3 fundoplications 01/20/2014   Edema of both lower extremities    Esophageal stenosis    Stricture after fundoplication REDO   Fibromyalgia    GERD (gastroesophageal reflux disease)    Glaucoma suspect    Heart murmur    no problems    Hemorrhoids, internal, with bleeding 06/27/2017   History of hiatal hernia    Hypertension    IBS (irritable bowel syndrome)    Lactose intolerance    Mitral valve prolapse    OSA (obstructive sleep apnea)    on CPAP-Mild - Sleep study 2004   Pre-diabetes    Pulmonary embolus (HCC)    2010 and 12-2013   Pulmonary nodule 12/13/2022   seen on CT chest xray on 12/16/22 - left upper lobe   Sleep apnea    wears CPAP   Thyroid nodule      dr. watching    Tinnitus    Subjective   Zoster 2014   R flank   Past Surgical History:  Procedure Laterality Date   ABDOMINAL HYSTERECTOMY     still has 1 ovary    APPENDECTOMY     BRAIN SURGERY  1995   Stem surgery, Arnold Chiari Malformation   CATARACT  EXTRACTION Bilateral 2017   cataracts, Dr. Elmer Picker   CHOLECYSTECTOMY     COLONOSCOPY     ESOPHAGEAL DILATION N/A 02/06/2023   Procedure: ESOPHAGEAL DILATION;  Surgeon: Corliss Skains, MD;  Location: [facility] OR;  Service: Thoracic;  Laterality: N/A;   ESOPHAGEAL DILATION N/A 02/13/2023   Procedure: ESOPHAGEAL DILATION;  Surgeon: Corliss Skains, MD;  Location: [facility] OR;  Service: Thoracic;  Laterality: N/A;   ESOPHAGEAL DILATION N/A 03/12/2023   Procedure: ESOPHAGEAL DILATION;  Surgeon: Corliss Skains, MD;  Location: [facility] OR;  Service: Thoracic;  Laterality: N/A;   ESOPHAGEAL MANOMETRY N/A 08/06/2019   Procedure: ESOPHAGEAL MANOMETRY (EM);  Surgeon: Iva Boop, MD;  Location: [facility] ENDOSCOPY;  Service: Endoscopy;  Laterality: N/A;   ESOPHAGOGASTRODUODENOSCOPY N/A 02/06/2023   Procedure: ESOPHAGOGASTRODUODENOSCOPY (EGD);  Surgeon: Corliss Skains, MD;  Location: [facility] OR;  Service: Thoracic;  Laterality: N/A;  With Savary dilation   ESOPHAGOGASTRODUODENOSCOPY N/A 02/13/2023   Procedure: ESOPHAGOGASTRODUODENOSCOPY (EGD);  Surgeon: Corliss Skains, MD;  Location: [facility] OR;  Service:  Thoracic;  Laterality: N/A;   ESOPHAGOGASTRODUODENOSCOPY N/A 03/12/2023   Procedure: ESOPHAGOGASTRODUODENOSCOPY (EGD);  Surgeon: Corliss Skains, MD;  Location: [facility] OR;  Service: Thoracic;  Laterality: N/A;   HEMORRHOID BANDING     HERNIA REPAIR     Hital Hernia   JOINT REPLACEMENT Right    knee   KNEE ARTHROSCOPY  05/16/2012   Procedure: ARTHROSCOPY KNEE;  Surgeon: Kennieth Rad, MD;  Location: [facility] OR;  Service: Orthopedics;  Laterality: Left;   KNEE ARTHROSCOPY Right 01/2018   NISSEN FUNDOPLICATION     X 3 lab, open x 2 last with mesh   POLYPECTOMY     REDUCTION MAMMAPLASTY Bilateral 2007   TOTAL KNEE ARTHROPLASTY Right 08/25/2019   Procedure: RIGHT TOTAL KNEE ARTHROPLASTY;  Surgeon: Ollen Gross, MD;  Location: [facility] ORS;  Service: Orthopedics;  Laterality: Right;    UPPER  GASTROINTESTINAL ENDOSCOPY     Patient Active Problem List   Diagnosis Date Noted   Vitamin D deficiency 12/12/2022   Decreased GFR 12/12/2022   Physically inactive 12/12/2022   Calculus of kidney 07/14/2021   OSA on CPAP 06/08/2021   Circadian rhythm sleep disorder, delayed sleep phase type 06/08/2021   Glaucoma suspect 06/25/2020   S/P total knee arthroplasty, right 08/27/2019   OA (osteoarthritis) of knee 08/25/2019   Primary osteoarthritis of right knee 08/25/2019   Atypical chest pain    Hemorrhoids, internal, with bleeding 06/27/2017   IDA (iron deficiency anemia) 02/20/2017   Chronic fatigue 02/03/2017   Left leg pain 11/29/2016   History of colonic polyps 12/02/2015   PCP NOTES >>>>>>>>>>>>>>>>>>>>>>>>> 02/13/2015   Anxiety 08/04/2014   Annual physical exam 04/29/2014   Fibromyalgia syndrome 02/04/2014   Dysphagia 01/20/2014   Chronic cough 01/08/2014   DOE (dyspnea on exertion) 12/26/2013   Diabetes (HCC) 09/24/2013   Abdominal pain, chronic, right flank -upper quadrant 07/02/2012   Class 2 severe obesity with serious comorbidity and body mass index (BMI) of 38.0 to 38.9 in adult [facility]) 09/30/2009   Hypersomnia with sleep apnea 09/28/2009   Essential hypertension 06/24/2009   DIZZINESS 06/24/2009   HEADACHE 06/24/2009   VENOUS INSUFFICIENCY, LEGS 11/19/2008   Acute pulmonary embolism (HCC) 05/19/2008   Pulmonary nodule 08/26/2007   Stricture and stenosis of esophagus 05/22/2007   GERD 01/31/2007   IBS (irritable bowel syndrome) 01/31/2007   MITRAL VALVE PROLAPSE, HX OF 01/31/2007    PCP: Willow Ora MD  REFERRING PROVIDER: Venita Lick, MD   REFERRING DIAG: M54.50 (ICD-10-CM) - Low back pain   Rationale for Evaluation and Treatment: Rehabilitation  THERAPY DIAG:  Other low back pain  Muscle weakness (generalized)  Other abnormalities of gait and mobility  ONSET DATE: Last 2 yrs  SUBJECTIVE:  SUBJECTIVE STATEMENT: Pt reports she does have a few hours of pain relief after aquatic sessions  POOL ACCESS: currently none, but considering membership at [place] or elsewhere.  Initial subjective Flare of LBP that has stayed for 3 weeks this time.  Thought it was my bed ??.  Using the pain patches every once in a while and they help. I wake up with pain. R TKR ~ 5 yrs ago.   PERTINENT HISTORY:   Lumbar spondylosis  PAIN:  Are you having pain? Yes: NPRS scale: current 4/10 Pain location: LB Pain description: ache Aggravating factors: sitting >30 minutes, lying on left side or too long on back Relieving factors: heating pad, pain patches  PRECAUTIONS: None  RED FLAGS: None   WEIGHT BEARING RESTRICTIONS: No  FALLS:  Has patient fallen in last 6 months? No   OCCUPATION: retired; Clinical cytogeneticist  PLOF: Independent  PATIENT GOALS: improve strength, get back into exercise.  NEXT MD VISIT: as needed  OBJECTIVE:  Note: Objective measures were completed at Evaluation unless otherwise noted.  DIAGNOSTIC FINDINGS:  Imaging from today does demonstrate mild degenerative disc disease at L4-L5 and L5-S1   PATIENT SURVEYS:  Modified Oswestry 15/45   COGNITION: Overall cognitive status: Within functional limits for tasks assessed     SENSATION: WFL  MUSCLE LENGTH: Hamstrings: Bilateral hamstring    POSTURE: rounded shoulders, forward head, and left knee valgus  PALPATION: Center l4-5; L5-S1  LUMBAR ROM:  wfl  LOWER EXTREMITY ROM:     wfl  LOWER EXTREMITY MMT:    MMT Right eval Left eval  Hip flexion 40.6 37.1  Hip extension    Hip abduction 27.4 26.7  Hip adduction    Hip internal rotation    Hip external rotation    Knee flexion    Knee extension 47.1 29.3  Ankle dorsiflexion    Ankle plantarflexion    Ankle inversion    Ankle  eversion     (Blank rows = not tested)  LUMBAR SPECIAL TESTS:  Slump test: Negative  FUNCTIONAL TESTS:  30 s STS 4 from pool bench with ue assist (norm 10-14 ) Timed up and go (TUG): 14.59 4 stage balance: passed 1&2; Tandem x 10s (unsteady); SLS x 6s  GAIT: Distance walked: 460ft Assistive device utilized: None Level of assistance: Complete Independence Comments: Left knee valgus knocking right.  TREATMENT  [facility] Adult PT Treatment:                                                DATE: 07/24/23 Pt seen for aquatic therapy today.  Treatment took place in water 3.5-4.75 ft in depth at the [facility] pool. Temp of water was 91.  Pt entered/exited the pool via stairs with hand rail.   *unsupported: walking forward/ backward in 4+ ft of water 4 laps,  *forward amb using barbell with ue row motion *Farmers carry yellow HB bilaterally then unilaterally * staggered stances with reciprocal arm swing with medium resistance bells; horiz abdct/ addct 3 sets 5 slow/5 fast * marching forward with alternating row motion 2 x 20 each with medium resistance bells - walking forward with arm swing * TrA set with 1/2 hollow noodle->full hollow noodle pull down to thighs x 10 wide stance then staggered stance * Straddled yellow noodle, extra noodle under arms behind back: cycling multiple laps  Pt requires the buoyancy and hydrostatic pressure of water for support, and to offload joints by unweighting joint load by at least 50 % in navel deep water and by at least 75-80% in chest to neck deep water.  Viscosity of the water is needed for resistance of strengthening. Water current perturbations provides challenge to standing balance requiring increased core activation.  [facility] Adult PT Treatment:                                                DATE: 07/17/23 Pt seen for aquatic therapy today.  Treatment took place in water 3.5-4.75 ft in depth at the [facility] pool. Temp of water was 91.   Pt entered/exited the pool via stairs with hand rail.   *unsupported: walking forward/ backward in 4+ ft of water 4 laps,  * side stepping with arm addct/ abdc with rainbow hand floats x 2 laps * wide stance with reciprocal arm swing with medium resistance bells; horiz abdct/ addct  * marching forward with alternating row motion 2 x 20 each with medium resistance bells - walking forward with arm swing * TrA set with 1/2 hollow noodle pull down to thighs x 10 -> marching forward with noodle knee taps * UE on wall:  hip abdct/ addct x 10 each -> bilat knee to chest stretch with feet in bottom hole of ladder -> return to wall to finish 2nd set of hip abdct; single leg clams x 10 each LE * Straddled square noodle, extra noodle under arms behind back: cycling multiple laps   Pt requires the buoyancy and hydrostatic pressure of water for support, and to offload joints by unweighting joint load by at least 50 % in navel deep water and by at least 75-80% in chest to neck deep water.  Viscosity of the water is needed for resistance of strengthening. Water current perturbations provides challenge to standing balance requiring increased core activation.  [facility] Adult PT Treatment:                                                DATE: 07/13/23 Pt seen for aquatic therapy today.  Treatment took place in water 3.5-4.75 ft in depth at the [facility] pool. Temp of water was 91.  Pt entered/exited the pool via stairs with hand rail.   *unsupported: walking forward/ backward in 4+ ft of water 4 laps, cues for neutral R foot and even step length * side stepping with arm addct/ abdc -cues to allow soft knees x 1 lap-> with rainbow hand floats x 2 laps * wide stance with reciprocal arm swing with medium resistance bells; horiz abdct/ addct  * marching forward with alternating row motion with medium resistance bells  * UE on wall:  hip abdct/ addct x 10 each; single leg clams x 10 each LE * TrA set with 1/2  hollow noodle pull down to thighs x 10 * Straddled square noodle, extra noodle under arms behind back: cycling    [facility] Adult PT Treatment:  DATE: 07/10/23 Therapeutic Exercise: Standing hip abduction AROM x8 BIL Standing heel/toe raises x8 cues for posture Standing hip extension AROM x8 BIL HEP establishment + education/handout  Neuromuscular re-ed: Hooklying PPT w/ ta activation 2x8 cues for breath control and comfortable ROM  (increased time to work on compensations) Seated hip adduction isometric w/ core contraction 2x8 cues for breath control and posture   Therapeutic Activity: Sidestepping along mat 3 laps Fwd/retro walking along mat 3 laps    [facility] Adult PT Treatment:                                                DATE: 07/05/23 Pt seen for aquatic therapy today.  Treatment took place in water 3.5-4.75 ft in depth at the [facility] pool. Temp of water was 91.  Pt entered/exited the pool via stairs with hand rail.   *unsupported: walking forward/ backward in 4+ ft of water 4 laps * side stepping with arm addct/ abdc -cues to allow soft knees x 1 lap-> with rainbow hand floats x 3 laps * marching forward/backward with alternating row motion with rainbow hand floats  * TrA set with 1/2 hollow noodle pull down to thighs x 8 * hip hinge with forward arm reach, UE on yellow hand floats x 8 (some pain) * UE on wall:  hip abdct/ addct x 10 each; hip ext to toe touch x 10 each; single leg clams x 5 each LE * Straddled square noodle, extra noodle under arms behind back: cycling with doggie paddle arms   Pt requires the buoyancy and hydrostatic pressure of water for support, and to offload joints by unweighting joint load by at least 50 % in navel deep water and by at least 75-80% in chest to neck deep water.  Viscosity of the water is needed for resistance of strengthening. Water current perturbations provides challenge to standing  balance requiring increased core activation.                                                                                                                           PATIENT EDUCATION:  Education details: rationale for interventions Person educated: Patient Education method: Explanation, Demonstration, Tactile cues, Verbal cues Education comprehension: verbalized understanding, returned demonstration, verbal cues required, tactile cues required, and needs further education     HOME EXERCISE PROGRAM: Access Code: [email or web address] URL: [email or web address] Date: 07/10/2023 Prepared by: Fransisco Hertz  Exercises - Seated Hip Adduction Isometrics with Newman Pies  - 2-3 x daily - 1 sets - 8-10 reps - Heel Raises with Counter Support  - 2-3 x daily - 1 sets - 8-10 reps - Standing Hip Extension with Counter Support  - 2-3 x daily - 1 sets - 8-10 reps  ASSESSMENT:  CLINICAL IMPRESSION: Pt reports feeling constantly tired and fatigued.  Was busy last couple  of days with some increase in LBP.  She is directed through progressed exercises changing position for added core engagement.  No adverse reaction. Reduction in LBP end of session. She reports about a 40% overall reduction in LBP since the onset of therapy meeting STG #3. Will likely re-cert in next week as pt Iis making steady but slow progress towards goal.  She has recently begun transitioning to land based intervention for added load bearing for further progression towards land based goals.   Initial impression Patient is a 73 y.o. f who was seen today for physical therapy evaluation and treatment for LBP.  She presents today with moderate pain sensitivity in focal area midline spine at L4-5- s1.  Pain reportedly interferes in daily activities and sleeping at night occasionally. Pt is not very active by own admission. Testing indicates strength deficits, she has difficulty rising from a seated position and some balance deficits evident  mostly with transitional movements.  She will benefit from skilled PT intervention.  She does not and likely will not have pool access so we will begin with aquatic sessions for pain management and to initiate muscle lengthening/strengthening and balance then incorporate land based intervention for added load bearing strengthening and final HEP for continued management of chronic condition  OBJECTIVE IMPAIRMENTS: Abnormal gait, decreased activity tolerance, decreased balance, impaired flexibility, and pain.   ACTIVITY LIMITATIONS: sleeping, stairs, transfers, and locomotion level  PARTICIPATION LIMITATIONS: shopping and community activity  PERSONAL FACTORS: Fitness are also affecting patient's functional outcome.   REHAB POTENTIAL: Good  CLINICAL DECISION MAKING: Evolving/moderate complexity  EVALUATION COMPLEXITY: Moderate   GOALS: Goals reviewed with patient? Yes  SHORT TERM GOALS: Target date: 07/07/23  Pt will tolerate full aquatic sessions consistently without increase in pain and with improving function to demonstrate good toleration and effectiveness of intervention.  Baseline: 07/10/23: reports improving tolerance w/ aquatics Goal status: MET  2. Pt will report walking x 15 minutes 5/7 days a week to demonstrate committment to increasing her physical activity Baseline: walking 30 min (on walking pad)  Goal status: MET - 07/17/23  3.  Pt will report a reduction of LBP by at least 25% Baseline: see chart 07/10/23: continues to endorse fluctuations 07/25/23: 40% Goal status: Met 07/25/23   LONG TERM GOALS: Target date: 08/04/23  Pt to improve on ODI by  20 % to demonstrate statistically significant Improvement in function. Baseline: 33% Goal status: INITIAL  2.  Pt will be indep with final HEP's (land and aquatic as appropriate) for continued management of condition Baseline:  Goal status: INITIAL  3.  Pt will report decrease in pain by at least 75% for improved toleration  to activity/quality of life and to demonstrate improved management of pain.  Baseline: see chart Goal status: INITIAL  4.  Pt will improve strength in LE up towards 10lbs to demonstrate improved overall physical function Baseline: see chart Goal status: INITIAL  5.  Pt will improve on 30s STS test to <or= 8 to demonstrate improving functional lower extremity strength, transitional movements, and balance Baseline: 4 Goal status: INITIAL  6.  Pt will improve on Tug test to <or=13s (norm) to demonstrate improvement in lower extremity function, mobility and decreased fall risk. Baseline: 14.59 Goal status: INITIAL  PLAN:  PT FREQUENCY: 2x/week  PT DURATION: 6 weeks  PLANNED INTERVENTIONS: 97164- PT Re-evaluation, 97110-Therapeutic exercises, 97530- Therapeutic activity, [id number]- Neuromuscular re-education, 97535- Self Care, [id number]- Manual therapy, [id number]- Gait training, [phone]- Orthotic Fit/training, [id number]- Aquatic Therapy, [phone]-  Ionotophoresis 4mg /ml Dexamethasone, Patient/Family education, Balance training, Stair training, Taping, Dry Needling, Joint mobilization, Cryotherapy, and Moist heat.  PLAN FOR NEXT SESSION: aquatic sessions for pain management and to initiate muscle lengthening/strengthening and balance then incorporate land based intervention for added load bearing strengthening and final HEP for continued management of chronic condition  Corrie Dandy Tomma [name]) Jennifer [name] MPT 07/25/23 4:30 PM [facility] [address] [place], [place], [address] Phone: [phone]   Fax:  [phone]

## 2023-07-26 ENCOUNTER — Ambulatory Visit (INDEPENDENT_AMBULATORY_CARE_PROVIDER_SITE_OTHER): Admitting: Internal Medicine

## 2023-07-26 ENCOUNTER — Encounter (INDEPENDENT_AMBULATORY_CARE_PROVIDER_SITE_OTHER): Payer: Self-pay | Admitting: Internal Medicine

## 2023-07-26 VITALS — BP 135/74 | HR 84 | Temp 97.6°F | Ht 68.0 in | Wt 232.0 lb

## 2023-07-26 DIAGNOSIS — E66812 Obesity, class 2: Secondary | ICD-10-CM | POA: Diagnosis not present

## 2023-07-26 DIAGNOSIS — Z7985 Long-term (current) use of injectable non-insulin antidiabetic drugs: Secondary | ICD-10-CM

## 2023-07-26 DIAGNOSIS — G4733 Obstructive sleep apnea (adult) (pediatric): Secondary | ICD-10-CM

## 2023-07-26 DIAGNOSIS — Z6838 Body mass index (BMI) 38.0-38.9, adult: Secondary | ICD-10-CM

## 2023-07-26 DIAGNOSIS — E1169 Type 2 diabetes mellitus with other specified complication: Secondary | ICD-10-CM | POA: Diagnosis not present

## 2023-07-26 DIAGNOSIS — I10 Essential (primary) hypertension: Secondary | ICD-10-CM

## 2023-07-26 MED ORDER — TIRZEPATIDE 7.5 MG/0.5ML ~~LOC~~ SOAJ: 7.5000 mg | SUBCUTANEOUS | 1 refills | Status: DC

## 2023-07-26 NOTE — Progress Notes (Signed)
 Office: [phone]  /  Fax: [phone]  Weight Summary And Biometrics  Vitals Temp: 97.6 F (36.4 C) BP: 135/74 Pulse Rate: 84 SpO2: 97 %   Anthropometric Measurements Height: 5\' 8"  (1.727 m) Weight: 232 lb (105.2 kg) BMI (Calculated): 35.28 Weight at Last Visit: 239 lb Weight Lost Since Last Visit: 7 lb Weight Gained Since Last Visit: 0 Starting Weight: 256 lb Total Weight Loss (lbs): 24 lb (10.9 kg) Peak Weight: 259 lb   Body Composition  Body Fat %: 45.8 % Fat Mass (lbs): 106.6 lbs Muscle Mass (lbs): 119.6 lbs Total Body Water (lbs): 81.4 lbs Visceral Fat Rating : 14    RMR: 1771  Today's Visit #: 9  Starting Date: 12/12/22   Subjective   Chief Complaint: Obesity  Interval History Discussed the use of AI scribe software for clinical note transcription with the patient, who gave verbal consent to proceed.  History of Present Illness Jennifer [name] is a 73 year old female with obesity, hypertension, and type 2 diabetes who presents for medical weight management.  She is currently engaged in a medical weight management program and has successfully lost seven pounds since her last visit. She adheres to a category two meal plan approximately 80% of the time, focusing on incorporating more whole foods, ensuring adequate protein intake, and maintaining hydration. She exercises two to four days a week for about fifteen to forty-five minutes, including both water and land therapy twice weekly. She reports adequate sleep and low stress levels.  Her type 2 diabetes is well-controlled with an A1c of 6.2, down from a previous high of 6.9 in 2015. She is on Mounjaro 7.5 mg once a week for diabetes management, which also aids in weight loss and improved glycemic control. She takes a liquid multivitamin daily and ensures adequate hydration.  Hypertension is well-managed with current blood pressure readings at 135/74 mmHg. She is on triamterene-hydrochlorothiazide 37.5  mg for blood pressure management. She also takes rosuvastatin for cholesterol management.  She uses a CPAP machine every night for obstructive sleep apnea, although she sometimes removes it during sleep without realizing it. Despite this, she feels rested upon waking.  Her diet is balanced with a focus on protein, including protein shakes, eggs, chicken, salmon, and beans. She is mindful of her carbohydrate intake, primarily consuming complex carbohydrates like brown rice and quinoa. She occasionally consumes protein bars, being cautious of their sugar content. She enjoys pickled beets but has been advised to limit her intake. No issues with constipation or stomach pain are reported, and she feels good and rested upon waking.    Challenges affecting patient progress: menopause.    Pharmacotherapy for weight management: She is currently taking Monjauro with diabetes as the primary indication with adequate clinical response  and without side effects..   Assessment and Plan   Treatment Plan For Obesity:  Recommended Dietary Goals  Dareen is currently in the action stage of change. As such, her goal is to continue weight management plan. She has agreed to: continue current plan  Behavioral Health and Counseling  We discussed the following behavioral modification strategies today: continue to work on maintaining a reduced calorie state, getting the recommended amount of protein, incorporating whole foods, making healthy choices, staying well hydrated and practicing mindfulness when eating..  Additional education and resources provided today: None  Recommended Physical Activity Goals  Crescent has been advised to work up to 150 minutes of moderate intensity aerobic activity a week and strengthening exercises  2-3 times per week for cardiovascular health, weight loss maintenance and preservation of muscle mass.   She has agreed to :  continue to gradually increase the amount and intensity of  exercise routine  Pharmacotherapy  We discussed various medication options to help Lorie with her weight loss efforts and we both agreed to : adequate clinical response to current dose, continue current regimen  Associated Conditions Impacted by Obesity Treatment  Essential hypertension  OSA on CPAP  Type 2 diabetes mellitus with other specified complication, without long-term current use of insulin (HCC)  Class 2 severe obesity with serious comorbidity and body mass index (BMI) of 38.0 to 38.9 in adult, unspecified obesity type (HCC)    Assessment and Plan Assessment & Plan Obesity Obesity management with a focus on weight loss. She has lost 7 pounds since the last visit, with a current weight of 232 pounds, down from 261 pounds. BMI has decreased from 39 to 35. Body fat percentage has decreased from 48% to 45%. Visceral fat rating has decreased from 16 to 14. She is on Mounjaro 7.5 mg weekly, which is effective at the current dose. No need to increase the dose to avoid appetite suppression and potential side effects, such as inadequate nutrition. She is following a category two meal plan 80% of the time, incorporating whole foods, adequate protein, and hydration. Exercising 2-4 days a week for 15-45 minutes. No issues with meal skipping, stress, or sleep. - Continue Mounjaro 7.5 mg weekly - Encourage adherence to category two meal plan - Encourage regular exercise 2-4 days a week - Provide two refills for Mounjaro  Type 2 Diabetes Mellitus Type 2 diabetes is well-controlled with an A1c of 6.2, down from a previous high of 6.9 . She is on Mounjaro, which is contributing to weight loss and improved glycemic control. - Continue current diabetes management with Mounjaro - Monitor A1c levels as per standard guidelines  Hypertension Hypertension is well-controlled with a blood pressure of 135/74 mmHg. She is on triamterene/hydrochlorothiazide 37.5 mg. Emphasis on maintaining hydration due  to diuretic use. - Continue triamterene/hydrochlorothiazide 37.5 mg - Encourage adequate hydration  Obstructive Sleep Apnea Obstructive sleep apnea managed with CPAP. She uses CPAP every night but occasionally removes it during sleep. Reports feeling rested in the morning. - Continue CPAP use nightly  General Health Maintenance She is taking a liquid multivitamin daily. Discussion on dietary habits, including protein intake, carbohydrate management, and the benefits of fermented foods like pickled beets. Emphasis on reading nutrition labels for protein bars to monitor sugar and protein content. Encouraged to consume complex carbohydrates and maintain a balanced diet. - Continue daily liquid multivitamin - Encourage balanced diet with adequate protein and complex carbohydrates - Educate on reading nutrition labels for protein bars  Follow-up Plan for follow-up visit in 4-6 weeks.       Objective   Physical Exam:  Blood pressure 135/74, pulse 84, temperature 97.6 F (36.4 C), height 5\' 8"  (1.727 m), weight 232 lb (105.2 kg), SpO2 97%. Body mass index is 35.28 kg/m.  General: She is overweight, cooperative, alert, well developed, and in no acute distress. PSYCH: Has normal mood, affect and thought process.   HEENT: EOMI, sclerae are anicteric. Lungs: Normal breathing effort, no conversational dyspnea. Extremities: No edema.  Neurologic: No gross sensory or motor deficits. No tremors or fasciculations noted.    Diagnostic Data Reviewed:  BMET    Component Value Date/Time   NA 142 06/18/2023 1437   NA 143 12/12/2022  1100   NA 143 02/16/2017 1450   NA 142 01/19/2016 1141   K 4.1 06/18/2023 1437   K 3.7 02/16/2017 1450   K 3.7 01/19/2016 1141   CL 104 06/18/2023 1437   CL 105 02/16/2017 1450   CO2 31 06/18/2023 1437   CO2 32 02/16/2017 1450   CO2 27 01/19/2016 1141   GLUCOSE 85 06/18/2023 1437   GLUCOSE 88 02/16/2017 1450   BUN 20 06/18/2023 1437   BUN 17 12/12/2022  1100   BUN 14 02/16/2017 1450   BUN 19.5 01/19/2016 1141   CREATININE 0.98 06/18/2023 1437   CREATININE 1.2 02/16/2017 1450   CREATININE 0.9 01/19/2016 1141   CALCIUM 9.5 06/18/2023 1437   CALCIUM 8.8 02/16/2017 1450   CALCIUM 8.8 01/19/2016 1141   GFRNONAA >60 06/18/2023 1437   GFRAA >60 12/25/2019 1353   Lab Results  Component Value Date   HGBA1C 6.2 (H) 06/08/2023   HGBA1C 6.2 (H) 10/14/2007   Lab Results  Component Value Date   INSULIN 20.4 12/12/2022   INSULIN 12.6 05/19/2021   Lab Results  Component Value Date   TSH 1.82 07/24/2023   CBC    Component Value Date/Time   WBC 4.5 06/18/2023 1437   WBC 4.5 03/12/2023 0721   RBC 4.24 06/18/2023 1437   HGB 12.1 06/18/2023 1437   HGB 12.1 02/16/2017 1450   HCT 37.1 06/18/2023 1437   HCT 36.6 02/16/2017 1450   PLT 209 06/18/2023 1437   PLT 239 02/16/2017 1450   MCV 87.5 06/18/2023 1437   MCV 87 02/16/2017 1450   MCH 28.5 06/18/2023 1437   MCHC 32.6 06/18/2023 1437   RDW 15.0 06/18/2023 1437   RDW 15.4 02/16/2017 1450   Iron Studies    Component Value Date/Time   IRON 59 12/05/2021 1451   IRON 49 02/16/2017 1536   TIBC 323 12/05/2021 1451   TIBC 284 02/16/2017 1536   FERRITIN 51 12/05/2021 1452   FERRITIN 66 02/16/2017 1536   IRONPCTSAT 18 12/05/2021 1451   IRONPCTSAT 17 (L) 02/16/2017 1536   Lipid Panel     Component Value Date/Time   CHOL 115 06/08/2023 1422   TRIG 64 06/08/2023 1422   HDL 67 06/08/2023 1422   CHOLHDL 1.7 06/08/2023 1422   VLDL 9.8 06/05/2022 1320   LDLCALC 34 06/08/2023 1422   Hepatic Function Panel     Component Value Date/Time   PROT 7.2 06/18/2023 1437   PROT 7.1 12/12/2022 1100   PROT 7.3 02/16/2017 1450   PROT 7.3 01/19/2016 1141   ALBUMIN 4.1 06/18/2023 1437   ALBUMIN 4.3 12/12/2022 1100   ALBUMIN 3.6 01/19/2016 1141   AST 26 06/18/2023 1437   AST 21 01/19/2016 1141   ALT 32 06/18/2023 1437   ALT 27 02/16/2017 1450   ALT 18 01/19/2016 1141   ALKPHOS 51 06/18/2023  1437   ALKPHOS 57 02/16/2017 1450   ALKPHOS 59 01/19/2016 1141   BILITOT 0.4 06/18/2023 1437   BILITOT 0.31 01/19/2016 1141   BILIDIR 0.0 01/08/2012 1224   IBILI 0.2 12/30/2010 1515      Component Value Date/Time   TSH 1.82 07/24/2023 1436   Nutritional Lab Results  Component Value Date   VD25OH 61.5 12/12/2022   VD25OH 42.2 05/19/2021    Medications: Outpatient Encounter Medications as of 07/26/2023  Medication Sig Note   acetaminophen (TYLENOL) 500 MG tablet Take 1 tablet (500 mg total) by mouth every 6 (six) hours as needed. (Patient taking differently:  Take 500 mg by mouth every 6 (six) hours as needed for moderate pain (pain score 4-6), mild pain (pain score 1-3) or headache.)    Apoaequorin (PREVAGEN) 10 MG CAPS Take 10 mg by mouth daily. prevagen    cycloSPORINE (RESTASIS) 0.05 % ophthalmic emulsion Place 1 drop into both eyes 2 (two) times daily.    hydrocortisone valerate cream (WESTCORT) 0.2 % Apply 1 Application topically daily as needed (itching).    hyoscyamine (LEVSIN) 0.125 MG tablet Take 1 tablet (0.125 mg total) by mouth every 4 (four) hours as needed.    Multiple Vitamins-Minerals (MULTIVITAMIN WITH MINERALS) tablet Take 2 tablets by mouth daily. Vitafusion woman gummy    NEXIUM 40 MG capsule Take 1 capsule (40 mg total) by mouth daily before breakfast.    olopatadine (PATADAY) 0.1 % ophthalmic solution Place 1 drop into the left eye 2 (two) times daily.    rosuvastatin (CRESTOR) 10 MG tablet Take 1 tablet (10 mg total) by mouth at bedtime.    temazepam (RESTORIL) 30 MG capsule Take 1 capsule (30 mg total) by mouth at bedtime as needed. for sleep 06/08/2023: Has not been able to get yet- awaiting appeal   tirzepatide [facility]) 7.5 MG/0.5ML Pen Inject 7.5 mg into the skin once a week.    triamterene-hydrochlorothiazide (MAXZIDE-25) 37.5-25 MG tablet Take 0.5 tablets by mouth daily.    XARELTO 2.5 MG TABS tablet TAKE TWO TABLETS BY MOUTH DAILY    [DISCONTINUED]  fluticasone (FLONASE) 50 MCG/ACT nasal spray Place 2 sprays into both nostrils daily. (Patient not taking: Reported on 07/03/2023)    No facility-administered encounter medications on file as of 07/26/2023.     Follow-Up   No follow-ups on file.Marland Kitchen She was informed of the importance of frequent follow up visits to maximize her success with intensive lifestyle modifications for her multiple health conditions.  Attestation Statement   Reviewed by clinician on day of visit: allergies, medications, problem list, medical history, surgical history, family history, social history, and previous encounter notes.     Worthy Rancher, MD

## 2023-07-27 ENCOUNTER — Encounter (HOSPITAL_BASED_OUTPATIENT_CLINIC_OR_DEPARTMENT_OTHER): Payer: Self-pay | Admitting: Physical Therapy

## 2023-07-27 ENCOUNTER — Ambulatory Visit (HOSPITAL_BASED_OUTPATIENT_CLINIC_OR_DEPARTMENT_OTHER): Payer: Medicare Other | Admitting: Physical Therapy

## 2023-07-27 DIAGNOSIS — M6281 Muscle weakness (generalized): Secondary | ICD-10-CM

## 2023-07-27 DIAGNOSIS — M5459 Other low back pain: Secondary | ICD-10-CM

## 2023-07-27 DIAGNOSIS — R2689 Other abnormalities of gait and mobility: Secondary | ICD-10-CM

## 2023-07-27 NOTE — Therapy (Signed)
 OUTPATIENT PHYSICAL THERAPY THORACOLUMBAR TREATMENT   Patient Name: Jennifer [name] MRN: [id number] DOB:[birth year], 73 y.o., female Today's Date: 07/27/2023  END OF SESSION:  PT End of Session - 07/27/23 1516     Visit Number 9    Number of Visits 14    Date for PT Re-Evaluation 08/04/23    Authorization Type medicare    Progress Note Due on Visit 10    PT Start Time 1518    PT Stop Time 1604    PT Time Calculation (min) 46 min    Activity Tolerance Patient tolerated treatment well               Past Medical History:  Diagnosis Date   Allergy    Anemia    Arthritis    Cataract    bil cataracts removed   Chronic chest pain    Chronic fatigue 02/03/2017   Clotting disorder (HCC)    Pulmonary Embolis 2010 & 2015   Colon polyps    inflamed partially ulcerated hyperplastic polyp   Diabetes (HCC) 09/24/2013   no meds   Diverticulosis    Dysphagia esophageal phase - chronic after 3 fundoplications 01/20/2014   Edema of both lower extremities    Esophageal stenosis    Stricture after fundoplication REDO   Fibromyalgia    GERD (gastroesophageal reflux disease)    Glaucoma suspect    Heart murmur    no problems    Hemorrhoids, internal, with bleeding 06/27/2017   History of hiatal hernia    Hypertension    IBS (irritable bowel syndrome)    Lactose intolerance    Mitral valve prolapse    OSA (obstructive sleep apnea)    on CPAP-Mild - Sleep study 2004   Pre-diabetes    Pulmonary embolus (HCC)    2010 and 12-2013   Pulmonary nodule 12/13/2022   seen on CT chest xray on 12/16/22 - left upper lobe   Sleep apnea    wears CPAP   Thyroid nodule      dr. watching    Tinnitus    Subjective   [place] 2014   R flank   Past Surgical History:  Procedure Laterality Date   ABDOMINAL HYSTERECTOMY     still has 1 ovary    APPENDECTOMY     BRAIN SURGERY  1995   Stem surgery, Arnold Chiari Malformation   CATARACT EXTRACTION Bilateral 2017   cataracts, Dr. Elmer Picker    CHOLECYSTECTOMY     COLONOSCOPY     ESOPHAGEAL DILATION N/A 02/06/2023   Procedure: ESOPHAGEAL DILATION;  Surgeon: Corliss Skains, MD;  Location: [facility] OR;  Service: Thoracic;  Laterality: N/A;   ESOPHAGEAL DILATION N/A 02/13/2023   Procedure: ESOPHAGEAL DILATION;  Surgeon: Corliss Skains, MD;  Location: [facility] OR;  Service: Thoracic;  Laterality: N/A;   ESOPHAGEAL DILATION N/A 03/12/2023   Procedure: ESOPHAGEAL DILATION;  Surgeon: Corliss Skains, MD;  Location: [facility] OR;  Service: Thoracic;  Laterality: N/A;   ESOPHAGEAL MANOMETRY N/A 08/06/2019   Procedure: ESOPHAGEAL MANOMETRY (EM);  Surgeon: Iva Boop, MD;  Location: [facility] END[facility];  Service: Endoscopy;  Laterality: N/A;   ESOPHAGOGASTRODUODENOSCOPY N/A 02/06/2023   Procedure: ESOPHAGOGASTRODUODENOSCOPY (EGD);  Surgeon: Corliss Skains, MD;  Location: [facility] OR;  Service: Thoracic;  Laterality: N/A;  With Savary dilation   ESOPHAGOGASTRODUODENOSCOPY N/A 02/13/2023   Procedure: ESOPHAGOGASTRODUODENOSCOPY (EGD);  Surgeon: Corliss Skains, MD;  Location: [facility] OR;  Service: Thoracic;  Laterality: N/A;   ESOPHAGOGASTRODUODENOSCOPY N/A 03/12/2023  Procedure: ESOPHAGOGASTRODUODENOSCOPY (EGD);  Surgeon: Corliss Skains, MD;  Location: [facility] OR;  Service: Thoracic;  Laterality: N/A;   HEMORRHOID BANDING     HERNIA REPAIR     Hital Hernia   JOINT REPLACEMENT Right    knee   KNEE ARTHROSCOPY  05/16/2012   Procedure: ARTHROSCOPY KNEE;  Surgeon: Kennieth Rad, MD;  Location: [facility] OR;  Service: Orthopedics;  Laterality: Left;   KNEE ARTHROSCOPY Right 01/2018   NISSEN FUNDOPLICATION     X 3 lab, open x 2 last with mesh   POLYPECTOMY     REDUCTION MAMMAPLASTY Bilateral 2007   TOTAL KNEE ARTHROPLASTY Right 08/25/2019   Procedure: RIGHT TOTAL KNEE ARTHROPLASTY;  Surgeon: Ollen Gross, MD;  Location: [facility] ORS;  Service: Orthopedics;  Laterality: Right;    UPPER GASTROINTESTINAL ENDOSCOPY     Patient Active Problem List    Diagnosis Date Noted   Vitamin D deficiency 12/12/2022   Decreased GFR 12/12/2022   Physically inactive 12/12/2022   Calculus of kidney 07/14/2021   OSA on CPAP 06/08/2021   Circadian rhythm sleep disorder, delayed sleep phase type 06/08/2021   Glaucoma suspect 06/25/2020   S/P total knee arthroplasty, right 08/27/2019   OA (osteoarthritis) of knee 08/25/2019   Primary osteoarthritis of right knee 08/25/2019   Atypical chest pain    Hemorrhoids, internal, with bleeding 06/27/2017   IDA (iron deficiency anemia) 02/20/2017   Chronic fatigue 02/03/2017   Left leg pain 11/29/2016   History of colonic polyps 12/02/2015   PCP NOTES >>>>>>>>>>>>>>>>>>>>>>>>> 02/13/2015   Anxiety 08/04/2014   Annual physical exam 04/29/2014   Fibromyalgia syndrome 02/04/2014   Dysphagia 01/20/2014   Chronic cough 01/08/2014   DOE (dyspnea on exertion) 12/26/2013   Diabetes (HCC) 09/24/2013   Abdominal pain, chronic, right flank -upper quadrant 07/02/2012   Class 2 severe obesity with serious comorbidity and body mass index (BMI) of 38.0 to 38.9 in adult [facility]) 09/30/2009   Hypersomnia with sleep apnea 09/28/2009   Essential hypertension 06/24/2009   DIZZINESS 06/24/2009   HEADACHE 06/24/2009   VENOUS INSUFFICIENCY, LEGS 11/19/2008   Acute pulmonary embolism (HCC) 05/19/2008   Pulmonary nodule 08/26/2007   Stricture and stenosis of esophagus 05/22/2007   GERD 01/31/2007   IBS (irritable bowel syndrome) 01/31/2007   MITRAL VALVE PROLAPSE, HX OF 01/31/2007    PCP: Willow Ora MD  REFERRING PROVIDER: Venita Lick, MD   REFERRING DIAG: M54.50 (ICD-10-CM) - Low back pain   Rationale for Evaluation and Treatment: Rehabilitation  THERAPY DIAG:  Other low back pain  Muscle weakness (generalized)  Other abnormalities of gait and mobility  ONSET DATE: Last 2 yrs  SUBJECTIVE:  SUBJECTIVE STATEMENT: Pt reports having to take some tylenol earlier, was more active this week. Currently 3/10 after meds, was a 6-7/10. Feels good after pool visits. States land HEP has been going well, but does state that hip ext/abd may be provocative. She also endorses a lot of activities outside of PT that can be irritating  POOL ACCESS: currently none, but considering membership at [place] or elsewhere.  Initial subjective Flare of LBP that has stayed for 3 weeks this time.  Thought it was my bed ??.  Using the pain patches every once in a while and they help. I wake up with pain. R TKR ~ 5 yrs ago.   PERTINENT HISTORY:   Lumbar spondylosis  PAIN:  Are you having pain? Yes: NPRS scale: currently 3/10 Pain location: LB Pain description: ache Aggravating factors: sitting >30 minutes, lying on left side or too long on back Relieving factors: heating pad, pain patches  PRECAUTIONS: None  RED FLAGS: None   WEIGHT BEARING RESTRICTIONS: No  FALLS:  Has patient fallen in last 6 months? No   OCCUPATION: retired; Clinical cytogeneticist  PLOF: Independent  PATIENT GOALS: improve strength, get back into exercise.  NEXT MD VISIT: as needed  OBJECTIVE:  Note: Objective measures were completed at Evaluation unless otherwise noted.  DIAGNOSTIC FINDINGS:  Imaging from today does demonstrate mild degenerative disc disease at L4-L5 and L5-S1   PATIENT SURVEYS:  Modified Oswestry 15/45   COGNITION: Overall cognitive status: Within functional limits for tasks assessed     SENSATION: WFL  MUSCLE LENGTH: Hamstrings: Bilateral hamstring    POSTURE: rounded shoulders, forward head, and left knee valgus  PALPATION: Center l4-5; L5-S1  LUMBAR ROM:  wfl  LOWER EXTREMITY ROM:     wfl  LOWER EXTREMITY MMT:    MMT Right eval Left eval  Hip flexion 40.6 37.1  Hip extension    Hip abduction 27.4 26.7  Hip adduction     Hip internal rotation    Hip external rotation    Knee flexion    Knee extension 47.1 29.3  Ankle dorsiflexion    Ankle plantarflexion    Ankle inversion    Ankle eversion     (Blank rows = not tested)  LUMBAR SPECIAL TESTS:  Slump test: Negative  FUNCTIONAL TESTS:  30 s STS 4 from pool bench with ue assist (norm 10-14 ) Timed up and go (TUG): 14.59 4 stage balance: passed 1&2; Tandem x 10s (unsteady); SLS x 6s  GAIT: Distance walked: 463ft Assistive device utilized: None Level of assistance: Complete Independence Comments: Left knee valgus knocking right.  TREATMENT  [facility] Adult PT Treatment:                                                DATE: 07/27/23 Therapeutic Exercise: Standing hip abduction x10 BIL cues for reduced lean, comfortable ROM Seated hip abd AROM x12 cues for comfortable ROM Seated hip abd red band x8  HEP education/discussion  Neuromuscular re-ed: Seated PPT x10 cues for posture and breath control  Seated march x8 BIL, red band x8 BIL   Self Care: Significant time spent w/ education/discussion re: activity outside of sessions, appropriate response to activity and rationale to modify as needed, pacing strategies, benefits of walking with modifications based on symptom response    [facility] Adult PT Treatment:  DATE: 07/24/23 Pt seen for aquatic therapy today.  Treatment took place in water 3.5-4.75 ft in depth at the [facility] pool. Temp of water was 91.  Pt entered/exited the pool via stairs with hand rail.   *unsupported: walking forward/ backward in 4+ ft of water 4 laps,  *forward amb using barbell with ue row motion *Farmers carry yellow HB bilaterally then unilaterally * staggered stances with reciprocal arm swing with medium resistance bells; horiz abdct/ addct 3 sets 5 slow/5 fast * marching forward with alternating row motion 2 x 20 each with medium resistance bells - walking forward with arm  swing * TrA set with 1/2 hollow noodle->full hollow noodle pull down to thighs x 10 wide stance then staggered stance * Straddled yellow noodle, extra noodle under arms behind back: cycling multiple laps   Pt requires the buoyancy and hydrostatic pressure of water for support, and to offload joints by unweighting joint load by at least 50 % in navel deep water and by at least 75-80% in chest to neck deep water.  Viscosity of the water is needed for resistance of strengthening. Water current perturbations provides challenge to standing balance requiring increased core activation.  [facility] Adult PT Treatment:                                                DATE: 07/17/23 Pt seen for aquatic therapy today.  Treatment took place in water 3.5-4.75 ft in depth at the [facility] pool. Temp of water was 91.  Pt entered/exited the pool via stairs with hand rail.   *unsupported: walking forward/ backward in 4+ ft of water 4 laps,  * side stepping with arm addct/ abdc with rainbow hand floats x 2 laps * wide stance with reciprocal arm swing with medium resistance bells; horiz abdct/ addct  * marching forward with alternating row motion 2 x 20 each with medium resistance bells - walking forward with arm swing * TrA set with 1/2 hollow noodle pull down to thighs x 10 -> marching forward with noodle knee taps * UE on wall:  hip abdct/ addct x 10 each -> bilat knee to chest stretch with feet in bottom hole of ladder -> return to wall to finish 2nd set of hip abdct; single leg clams x 10 each LE * Straddled square noodle, extra noodle under arms behind back: cycling multiple laps   Pt requires the buoyancy and hydrostatic pressure of water for support, and to offload joints by unweighting joint load by at least 50 % in navel deep water and by at least 75-80% in chest to neck deep water.  Viscosity of the water is needed for resistance of strengthening. Water current perturbations provides challenge to  standing balance requiring increased core activation.  [facility] Adult PT Treatment:                                                DATE: 07/13/23 Pt seen for aquatic therapy today.  Treatment took place in water 3.5-4.75 ft in depth at the [facility] pool. Temp of water was 91.  Pt entered/exited the pool via stairs with hand rail.   *unsupported: walking forward/ backward in 4+ ft of water  4 laps, cues for neutral R foot and even step length * side stepping with arm addct/ abdc -cues to allow soft knees x 1 lap-> with rainbow hand floats x 2 laps * wide stance with reciprocal arm swing with medium resistance bells; horiz abdct/ addct  * marching forward with alternating row motion with medium resistance bells  * UE on wall:  hip abdct/ addct x 10 each; single leg clams x 10 each LE * TrA set with 1/2 hollow noodle pull down to thighs x 10 * Straddled square noodle, extra noodle under arms behind back: cycling    [facility] Adult PT Treatment:                                                DATE: 07/10/23 Therapeutic Exercise: Standing hip abduction AROM x8 BIL Standing heel/toe raises x8 cues for posture Standing hip extension AROM x8 BIL HEP establishment + education/handout  Neuromuscular re-ed: Hooklying PPT w/ ta activation 2x8 cues for breath control and comfortable ROM  (increased time to work on compensations) Seated hip adduction isometric w/ core contraction 2x8 cues for breath control and posture   Therapeutic Activity: Sidestepping along mat 3 laps Fwd/retro walking along mat 3 laps    [facility] Adult PT Treatment:                                                DATE: 07/05/23 Pt seen for aquatic therapy today.  Treatment took place in water 3.5-4.75 ft in depth at the [facility] pool. Temp of water was 91.  Pt entered/exited the pool via stairs with hand rail.   *unsupported: walking forward/ backward in 4+ ft of water 4 laps * side stepping with arm addct/ abdc  -cues to allow soft knees x 1 lap-> with rainbow hand floats x 3 laps * marching forward/backward with alternating row motion with rainbow hand floats  * TrA set with 1/2 hollow noodle pull down to thighs x 8 * hip hinge with forward arm reach, UE on yellow hand floats x 8 (some pain) * UE on wall:  hip abdct/ addct x 10 each; hip ext to toe touch x 10 each; single leg clams x 5 each LE * Straddled square noodle, extra noodle under arms behind back: cycling with doggie paddle arms   Pt requires the buoyancy and hydrostatic pressure of water for support, and to offload joints by unweighting joint load by at least 50 % in navel deep water and by at least 75-80% in chest to neck deep water.  Viscosity of the water is needed for resistance of strengthening. Water current perturbations provides challenge to standing balance requiring increased core activation.  PATIENT EDUCATION:  Education details: rationale for interventions Person educated: Patient Education method: Explanation, Demonstration, Tactile cues, Verbal cues Education comprehension: verbalized understanding, returned demonstration, verbal cues required, tactile cues required, and needs further education     HOME EXERCISE PROGRAM: Access Code: [email or web address] URL: [email or web address] Date: 07/27/2023 Prepared by: Fransisco Hertz  Exercises - Seated Hip Adduction Isometrics with Newman Pies  - 2-3 x daily - 1 sets - 8-10 reps - Heel Raises with Counter Support  - 2-3 x daily - 1 sets - 8-10 reps - Backward Walking with Counter Support  - 2-3 x daily - 8 reps - Seated Hip Abduction  - 2-3 x daily - 1 sets - 10 reps - Seated March  - 2-3 x daily - 1 sets - 8 reps  ASSESSMENT:  CLINICAL IMPRESSION: 07/27/2023 Pt arrives w/ report of continued pain, increased pain with a busy week. Much of session is spent w/  education as above with emphasis on activity modification and appropriate pacing strategies as she endorses tendency to push through a lot of symptoms. Also working on HEP modifications with emphasis on hip strengthening as she states standing versions can be irritating. Improved tolerance to today's land session compared to prior per pt report. No adverse events. Recommend continuing along current POC in order to address relevant deficits and improve functional tolerance. Pt departs today's session in no acute distress, all voiced questions/concerns addressed appropriately from PT perspective.     Initial impression Patient is a 73 y.o. f who was seen today for physical therapy evaluation and treatment for LBP.  She presents today with moderate pain sensitivity in focal area midline spine at L4-5- s1.  Pain reportedly interferes in daily activities and sleeping at night occasionally. Pt is not very active by own admission. Testing indicates strength deficits, she has difficulty rising from a seated position and some balance deficits evident mostly with transitional movements.  She will benefit from skilled PT intervention.  She does not and likely will not have pool access so we will begin with aquatic sessions for pain management and to initiate muscle lengthening/strengthening and balance then incorporate land based intervention for added load bearing strengthening and final HEP for continued management of chronic condition  OBJECTIVE IMPAIRMENTS: Abnormal gait, decreased activity tolerance, decreased balance, impaired flexibility, and pain.   ACTIVITY LIMITATIONS: sleeping, stairs, transfers, and locomotion level  PARTICIPATION LIMITATIONS: shopping and community activity  PERSONAL FACTORS: Fitness are also affecting patient's functional outcome.   REHAB POTENTIAL: Good  CLINICAL DECISION MAKING: Evolving/moderate complexity  EVALUATION COMPLEXITY: Moderate   GOALS: Goals reviewed with  patient? Yes  SHORT TERM GOALS: Target date: 07/07/23  Pt will tolerate full aquatic sessions consistently without increase in pain and with improving function to demonstrate good toleration and effectiveness of intervention.  Baseline: 07/10/23: reports improving tolerance w/ aquatics Goal status: MET  2. Pt will report walking x 15 minutes 5/7 days a week to demonstrate committment to increasing her physical activity Baseline: walking 30 min (on walking pad)  Goal status: MET - 07/17/23  3.  Pt will report a reduction of LBP by at least 25% Baseline: see chart 07/10/23: continues to endorse fluctuations 07/25/23: 40% Goal status: Met 07/25/23   LONG TERM GOALS: Target date: 08/04/23  Pt to improve on ODI by  20 % to demonstrate statistically significant Improvement in function. Baseline: 33% Goal status: INITIAL  2.  Pt will be indep with final HEP's (land and aquatic as appropriate) for continued management of  condition Baseline:  Goal status: INITIAL  3.  Pt will report decrease in pain by at least 75% for improved toleration to activity/quality of life and to demonstrate improved management of pain.  Baseline: see chart Goal status: INITIAL  4.  Pt will improve strength in LE up towards 10lbs to demonstrate improved overall physical function Baseline: see chart Goal status: INITIAL  5.  Pt will improve on 30s STS test to <or= 8 to demonstrate improving functional lower extremity strength, transitional movements, and balance Baseline: 4 Goal status: INITIAL  6.  Pt will improve on Tug test to <or=13s (norm) to demonstrate improvement in lower extremity function, mobility and decreased fall risk. Baseline: 14.59 Goal status: INITIAL  PLAN:  PT FREQUENCY: 2x/week  PT DURATION: 6 weeks  PLANNED INTERVENTIONS: 97164- PT Re-evaluation, 97110-Therapeutic exercises, 97530- Therapeutic activity, 97112- Neuromuscular re-education, 97535- Self Care, 86578- Manual therapy, [phone]-  Gait training, [phone]- Orthotic Fit/training, [phone]- Aquatic Therapy, [phone]- Ionotophoresis 4mg /ml Dexamethasone, Patient/Family education, Balance training, Stair training, Taping, Dry Needling, Joint mobilization, Cryotherapy, and Moist heat.  PLAN FOR NEXT SESSION: aquatic sessions for pain management and to initiate muscle lengthening/strengthening and balance then incorporate land based intervention for added load bearing strengthening and final HEP for continued management of chronic condition  Ashley Murrain PT, DPT 07/27/2023 4:13 PM  [facility] [address] [place], [place], [address] Phone: [phone]   Fax:  [phone]

## 2023-07-30 ENCOUNTER — Ambulatory Visit (HOSPITAL_COMMUNITY)
Admission: RE | Admit: 2023-07-30 | Discharge: 2023-07-30 | Disposition: A | Discharge status: Home / Self Care | Source: Ambulatory Visit | Attending: "Endocrinology | Admitting: "Endocrinology

## 2023-07-30 DIAGNOSIS — E041 Nontoxic single thyroid nodule: Secondary | ICD-10-CM | POA: Diagnosis not present

## 2023-07-31 ENCOUNTER — Encounter (HOSPITAL_BASED_OUTPATIENT_CLINIC_OR_DEPARTMENT_OTHER): Payer: Self-pay | Admitting: Physical Therapy

## 2023-07-31 ENCOUNTER — Ambulatory Visit (HOSPITAL_BASED_OUTPATIENT_CLINIC_OR_DEPARTMENT_OTHER): Payer: Medicare Other | Admitting: Physical Therapy

## 2023-07-31 DIAGNOSIS — R2689 Other abnormalities of gait and mobility: Secondary | ICD-10-CM | POA: Diagnosis not present

## 2023-07-31 DIAGNOSIS — M6281 Muscle weakness (generalized): Secondary | ICD-10-CM

## 2023-07-31 DIAGNOSIS — M5459 Other low back pain: Secondary | ICD-10-CM

## 2023-07-31 NOTE — Therapy (Signed)
 OUTPATIENT PHYSICAL THERAPY THORACOLUMBAR TR/re-cert Reporting Period 06/19/23 to 07/31/23  See note below for Objective Data and Assessment of Progress/Goals.      Patient Name: Jennifer [name] MRN: [id number] DOB:1951/01/17, 73 y.o., female Today's Date: 07/31/2023  END OF SESSION:  PT End of Session - 07/31/23 1644     Visit Number 10    Number of Visits 22    Date for PT Re-Evaluation 09/14/23    Authorization Type medicare    Progress Note Due on Visit 20    PT Start Time 1616    PT Stop Time 1700    PT Time Calculation (min) 44 min    Activity Tolerance Patient tolerated treatment well    Behavior During Therapy [facility] for tasks assessed/performed                Past Medical History:  Diagnosis Date   Allergy    Anemia    Arthritis    Cataract    bil cataracts removed   Chronic chest pain    Chronic fatigue 02/03/2017   Clotting disorder (HCC)    Pulmonary Embolis 2010 & 2015   Colon polyps    inflamed partially ulcerated hyperplastic polyp   Diabetes (HCC) 09/24/2013   no meds   Diverticulosis    Dysphagia esophageal phase - chronic after 3 fundoplications 01/20/2014   Edema of both lower extremities    Esophageal stenosis    Stricture after fundoplication REDO   Fibromyalgia    GERD (gastroesophageal reflux disease)    Glaucoma suspect    Heart murmur    no problems    Hemorrhoids, internal, with bleeding 06/27/2017   History of hiatal hernia    Hypertension    IBS (irritable bowel syndrome)    Lactose intolerance    Mitral valve prolapse    OSA (obstructive sleep apnea)    on CPAP-Mild - Sleep study 2004   Pre-diabetes    Pulmonary embolus (HCC)    2010 and 12-2013   Pulmonary nodule 12/13/2022   seen on CT chest xray on 12/16/22 - left upper lobe   Sleep apnea    wears CPAP   Thyroid nodule      dr. watching    Tinnitus    Subjective   Zoster 2014   R flank   Past Surgical History:  Procedure Laterality Date   ABDOMINAL HYSTERECTOMY      still has 1 ovary    APPENDECTOMY     BRAIN SURGERY  1995   Stem surgery, Arnold Chiari Malformation   CATARACT EXTRACTION Bilateral 2017   cataracts, Dr. Elmer Picker   CHOLECYSTECTOMY     COLONOSCOPY     ESOPHAGEAL DILATION N/A 02/06/2023   Procedure: ESOPHAGEAL DILATION;  Surgeon: Corliss Skains, MD;  Location: [facility] OR;  Service: Thoracic;  Laterality: N/A;   ESOPHAGEAL DILATION N/A 02/13/2023   Procedure: ESOPHAGEAL DILATION;  Surgeon: Corliss Skains, MD;  Location: [facility] OR;  Service: Thoracic;  Laterality: N/A;   ESOPHAGEAL DILATION N/A 03/12/2023   Procedure: ESOPHAGEAL DILATION;  Surgeon: Corliss Skains, MD;  Location: [facility] OR;  Service: Thoracic;  Laterality: N/A;   ESOPHAGEAL MANOMETRY N/A 08/06/2019   Procedure: ESOPHAGEAL MANOMETRY (EM);  Surgeon: Iva Boop, MD;  Location: [facility] END[facility]COPY;  Service: Endoscopy;  Laterality: N/A;   ESOPHAGOGASTRODUODENOSCOPY N/A 02/06/2023   Procedure: ESOPHAGOGASTRODUODENOSCOPY (EGD);  Surgeon: Corliss Skains, MD;  Location[facility] OR;  Service: Thoracic;  Laterality: N/A;  With Savary dilation  ESOPHAGOGASTRODUODENOSCOPY N/A 02/13/2023   Procedure: ESOPHAGOGASTRODUODENOSCOPY (EGD);  Surgeon: Corliss Skains, MD;  Location: [facility] OR;  Service: Thoracic;  Laterality: N/A;   ESOPHAGOGASTRODUODENOSCOPY N/A 03/12/2023   Procedure: ESOPHAGOGASTRODUODENOSCOPY (EGD);  Surgeon: Corliss Skains, MD;  Location: [facility] OR;  Service: Thoracic;  Laterality: N/A;   HEMORRHOID BANDING     HERNIA REPAIR     Hital Hernia   JOINT REPLACEMENT Right    knee   KNEE ARTHROSCOPY  05/16/2012   Procedure: ARTHROSCOPY KNEE;  Surgeon: Kennieth Rad, MD;  Location: [facility] OR;  Service: Orthopedics;  Laterality: Left;   KNEE ARTHROSCOPY Right 01/2018   NISSEN FUNDOPLICATION     X 3 lab, open x 2 last with mesh   POLYPECTOMY     REDUCTION MAMMAPLASTY Bilateral 2007   TOTAL KNEE ARTHROPLASTY Right 08/25/2019   Procedure: RIGHT TOTAL KNEE ARTHROPLASTY;   Surgeon: Ollen Gross, MD;  Location: [facility] ORS;  Service: Orthopedics;  Laterality: Right;    UPPER GASTROINTESTINAL ENDOSCOPY     Patient Active Problem List   Diagnosis Date Noted   Vitamin D deficiency 12/12/2022   Decreased GFR 12/12/2022   Physically inactive 12/12/2022   Calculus of kidney 07/14/2021   OSA on CPAP 06/08/2021   Circadian rhythm sleep disorder, delayed sleep phase type 06/08/2021   Glaucoma suspect 06/25/2020   S/P total knee arthroplasty, right 08/27/2019   OA (osteoarthritis) of knee 08/25/2019   Primary osteoarthritis of right knee 08/25/2019   Atypical chest pain    Hemorrhoids, internal, with bleeding 06/27/2017   IDA (iron deficiency anemia) 02/20/2017   Chronic fatigue 02/03/2017   Left leg pain 11/29/2016   History of colonic polyps 12/02/2015   PCP NOTES >>>>>>>>>>>>>>>>>>>>>>>>> 02/13/2015   Anxiety 08/04/2014   Annual physical exam 04/29/2014   Fibromyalgia syndrome 02/04/2014   Dysphagia 01/20/2014   Chronic cough 01/08/2014   DOE (dyspnea on exertion) 12/26/2013   Diabetes (HCC) 09/24/2013   Abdominal pain, chronic, right flank -upper quadrant 07/02/2012   Class 2 severe obesity with serious comorbidity and body mass index (BMI) of 38.0 to 38.9 in adult [facility]) 09/30/2009   Hypersomnia with sleep apnea 09/28/2009   Essential hypertension 06/24/2009   DIZZINESS 06/24/2009   HEADACHE 06/24/2009   VENOUS INSUFFICIENCY, LEGS 11/19/2008   Acute pulmonary embolism (HCC) 05/19/2008   Pulmonary nodule 08/26/2007   Stricture and stenosis of esophagus 05/22/2007   GERD 01/31/2007   IBS (irritable bowel syndrome) 01/31/2007   MITRAL VALVE PROLAPSE, HX OF 01/31/2007    PCP: Willow Ora MD  REFERRING PROVIDER: Venita Lick, MD   REFERRING DIAG: M54.50 (ICD-10-CM) - Low back pain   Rationale for Evaluation and Treatment: Rehabilitation  THERAPY DIAG:  Other low back pain  Muscle weakness (generalized)  Other abnormalities of gait and  mobility  ONSET DATE: Last 2 yrs  SUBJECTIVE:  SUBJECTIVE STATEMENT: Pt reports having to take some tylenol earlier, was more active this week. Currently 3/10 after meds, was a 6-7/10. Feels good after pool visits. States land HEP has been going well, but does state that hip ext/abd may be provocative. She also endorses a lot of activities outside of PT that can be irritating  POOL ACCESS: currently none, but considering membership at [place] or elsewhere.  Initial subjective Flare of LBP that has stayed for 3 weeks this time.  Thought it was my bed ??.  Using the pain patches every once in a while and they help. I wake up with pain. R TKR ~ 5 yrs ago.   PERTINENT HISTORY:   Lumbar spondylosis  PAIN:  Are you having pain? Yes: NPRS scale: currently 4/10 Pain location: LB Pain description: ache Aggravating factors: sitting >30 minutes, lying on left side or too long on back Relieving factors: heating pad, pain patches  PRECAUTIONS: None  RED FLAGS: None   WEIGHT BEARING RESTRICTIONS: No  FALLS:  Has patient fallen in last 6 months? No   OCCUPATION: retired; Clinical cytogeneticist  PLOF: Independent  PATIENT GOALS: improve strength, get back into exercise.  NEXT MD VISIT: as needed  OBJECTIVE:  Note: Objective measures were completed at Evaluation unless otherwise noted.  DIAGNOSTIC FINDINGS:  Imaging from today does demonstrate mild degenerative disc disease at L4-L5 and L5-S1   PATIENT SURVEYS:  Modified Oswestry 15/45   COGNITION: Overall cognitive status: Within functional limits for tasks assessed     SENSATION: WFL  MUSCLE LENGTH: Hamstrings: Bilateral hamstring    POSTURE: rounded shoulders, forward head, and left knee valgus  PALPATION: Center l4-5; L5-S1  LUMBAR ROM:   wfl  LOWER EXTREMITY ROM:     wfl  LOWER EXTREMITY MMT:    MMT Right eval Left eval R / L 4/8  Hip flexion 40.6 37.1 21.1 / 36.1  Hip extension     Hip abduction 27.4 26.7 25.9 / 20.4  Hip adduction     Hip internal rotation     Hip external rotation     Knee flexion     Knee extension 47.1 29.3 29.4/ 30.2  Ankle dorsiflexion     Ankle plantarflexion     Ankle inversion     Ankle eversion      (Blank rows = not tested)  LUMBAR SPECIAL TESTS:  Slump test: Negative  FUNCTIONAL TESTS:  30 s STS 4 from pool bench with ue assist (norm 10-14 ) Timed up and go (TUG): 14.59 4 stage balance: passed 1&2; Tandem x 10s (unsteady); SLS x 6s     07/31/23   30s STS from pool bench: 5   TUG 13.51   4 stage balance: SLS: 14s GAIT: Distance walked: 427ft Assistive device utilized: None Level of assistance: Complete Independence Comments: Left knee valgus knocking right.  TREATMENT  Re-cert testing  [facility] Adult PT Treatment:                                                DATE: 07/31/23 Pt seen for aquatic therapy today.  Treatment took place in water 3.5-4.75 ft in depth at the [facility] pool. Temp of water was 91.  Pt entered/exited the pool via stairs with hand rail.   *unsupported: walking forward/ backward in 4+ ft of water 4 laps,  *side stepping  ue RBHB ue add/abd *Farmers carry yellow HB bilaterally then unilaterally Standing resisted row using rider band elbow straight wide stance then staggered x 10.  Cues for engaged core * wide stance with reciprocal arm swing with medium resistance bells;3 sets 5 slow/5 fast * TrA set with 1/2 hollow noodle->full hollow noodle pull down to thighs x 10 wide stance then staggered stance * Straddled yellow noodle, extra noodle under arms behind back: cycling multiple laps   Pt requires the buoyancy and hydrostatic pressure of water for support, and to offload joints by unweighting joint load by at least 50 % in navel deep  water and by at least 75-80% in chest to neck deep water.  Viscosity of the water is needed for resistance of strengthening. Water current perturbations provides challenge to standing balance requiring increased core activation.                    PATIENT EDUCATION:  Education details: rationale for interventions Person educated: Patient Education method: Explanation, Demonstration, Tactile cues, Verbal cues Education comprehension: verbalized understanding, returned demonstration, verbal cues required, tactile cues required, and needs further education     HOME EXERCISE PROGRAM: Access Code: [email or web address] URL: [email or web address] Date: 07/27/2023 Prepared by: Fransisco Hertz  Exercises - Seated Hip Adduction Isometrics with Newman Pies  - 2-3 x daily - 1 sets - 8-10 reps - Heel Raises with Counter Support  - 2-3 x daily - 1 sets - 8-10 reps - Backward Walking with Counter Support  - 2-3 x daily - 8 reps - Seated Hip Abduction  - 2-3 x daily - 1 sets - 10 reps - Seated March  - 2-3 x daily - 1 sets - 8 reps  ASSESSMENT:  CLINICAL IMPRESSION: 07/31/2023 PN: pt reports her pain is down ~ 40% since start of therapy.  She has transitioned onto land based intervention with fair toleration so far. She has met all of her STG and is progressing towards her LTG.  Progress is slow due to pain. She is flared today which has skewed her strength testing demonstrating a decrease in RLE in hip flex and knee ext.  She has slightly improved her 30 S STS and TUG indicating an improvement in transitional movements and balance.  She will continue to benefit from skilled PT intervention. Discussions going forward with potential for pool access as she VU of chronic condition that will need to be managed.  Will see pt predominantly in aquatics but intermix land based for further progression of strength and toleration to activity and towards meeting all land based goals.   OBJECTIVE IMPAIRMENTS: Abnormal gait,  decreased activity tolerance, decreased balance, impaired flexibility, and pain.   ACTIVITY LIMITATIONS: sleeping, stairs, transfers, and locomotion level  PARTICIPATION LIMITATIONS: shopping and community activity  PERSONAL FACTORS: Fitness are also affecting patient's functional outcome.   REHAB POTENTIAL: Good  CLINICAL DECISION MAKING: Evolving/moderate complexity  EVALUATION COMPLEXITY: Moderate   GOALS: Goals reviewed with patient? Yes  SHORT TERM GOALS: Target date: 07/07/23  Pt will tolerate full aquatic sessions consistently without increase in pain and with improving function to demonstrate good toleration and effectiveness of intervention.  Baseline: 07/10/23: reports improving tolerance w/ aquatics Goal status: MET  2. Pt will report walking x 15 minutes 5/7 days a week to demonstrate committment to increasing her physical activity Baseline: walking 30 min (on walking pad)  Goal status: MET - 07/17/23  3.  Pt will report a reduction of LBP by at least 25%  Baseline: see chart 07/10/23: continues to endorse fluctuations 07/25/23: 40% Goal status: Met 07/25/23   LONG TERM GOALS: Target date: 09/14/23  Pt to improve on ODI by  20 % to demonstrate statistically significant Improvement in function. Baseline: 33% Goal status: INITIAL  2.  Pt will be indep with final HEP's (land and aquatic as appropriate) for continued management of condition Baseline:  Goal status: INITIAL  3.  Pt will report decrease in pain by at least 75% for improved toleration to activity/quality of life and to demonstrate improved management of pain.  Baseline: see chart; ~40% 07/31/23 Goal status: In progress  4.  Pt will improve strength in LE up towards 10lbs to demonstrate improved overall physical function Baseline: see chart Goal status: In progress 07/31/23  5.  Pt will improve on 30s STS test to <or= 8 to demonstrate improving functional lower extremity strength, transitional movements,  and balance Baseline: 4; 5 07/31/23 Goal status: In progress  6.  Pt will improve on Tug test to <or=13s (norm) to demonstrate improvement in lower extremity function, mobility and decreased fall risk. Baseline: 14.59; 13.51 (07/31/23) Goal status: In progress  PLAN:  PT FREQUENCY: 2x/week  PT DURATION: 6 weeks  PLANNED INTERVENTIONS: 97164- PT Re-evaluation, 97110-Therapeutic exercises, 97530- Therapeutic activity, 97112- Neuromuscular re-education, 97535- Self Care, 25366- Manual therapy, [phone]- Gait training, [phone]- Orthotic Fit/training, [phone]- Aquatic Therapy, [phone]- Ionotophoresis 4mg /ml Dexamethasone, Patient/Family education, Balance training, Stair training, Taping, Dry Needling, Joint mobilization, Cryotherapy, and Moist heat.  PLAN FOR NEXT SESSION: aquatic sessions for pain management and to initiate muscle lengthening/strengthening and balance then incorporate land based intervention for added load bearing strengthening and final HEP for continued management of chronic condition  Corrie Dandy Tomma Lightning) Burma Ketcher MPT 07/31/23 5:58 PM [facility] [address] [place], [place], [address] Phone: [phone]   Fax:  [phone]

## 2023-08-02 ENCOUNTER — Ambulatory Visit (HOSPITAL_BASED_OUTPATIENT_CLINIC_OR_DEPARTMENT_OTHER): Admitting: Physical Therapy

## 2023-08-02 DIAGNOSIS — R2689 Other abnormalities of gait and mobility: Secondary | ICD-10-CM | POA: Diagnosis not present

## 2023-08-02 DIAGNOSIS — M6281 Muscle weakness (generalized): Secondary | ICD-10-CM | POA: Diagnosis not present

## 2023-08-02 DIAGNOSIS — M5459 Other low back pain: Secondary | ICD-10-CM

## 2023-08-02 NOTE — Therapy (Signed)
 OUTPATIENT PHYSICAL THERAPY THORACOLUMBAR TR/re-cert   Patient Name: Jennifer [name] MRN: [id number] DOB:1950/08/21, 73 y.o., female Today's Date: 08/02/2023  END OF SESSION:  PT End of Session - 08/02/23 1438     Visit Number 11    Number of Visits 22    Date for PT Re-Evaluation 09/14/23    Authorization Type medicare    Progress Note Due on Visit 20    PT Start Time 1436    PT Stop Time 1515    PT Time Calculation (min) 39 min    Activity Tolerance Patient tolerated treatment well    Behavior During Therapy [facility] for tasks assessed/performed              Past Medical History:  Diagnosis Date   Allergy    Anemia    Arthritis    Cataract    bil cataracts removed   Chronic chest pain    Chronic fatigue 02/03/2017   Clotting disorder (HCC)    Pulmonary Embolis 2010 & 2015   Colon polyps    inflamed partially ulcerated hyperplastic polyp   Diabetes (HCC) 09/24/2013   no meds   Diverticulosis    Dysphagia esophageal phase - chronic after 3 fundoplications 01/20/2014   Edema of both lower extremities    Esophageal stenosis    Stricture after fundoplication REDO   Fibromyalgia    GERD (gastroesophageal reflux disease)    Glaucoma suspect    Heart murmur    no problems    Hemorrhoids, internal, with bleeding 06/27/2017   History of hiatal hernia    Hypertension    IBS (irritable bowel syndrome)    Lactose intolerance    Mitral valve prolapse    OSA (obstructive sleep apnea)    on CPAP-Mild - Sleep study 2004   Pre-diabetes    Pulmonary embolus (HCC)    2010 and 12-2013   Pulmonary nodule 12/13/2022   seen on CT chest xray on 12/16/22 - left upper lobe   Sleep apnea    wears CPAP   Thyroid nodule      dr. watching    Tinnitus    Subjective   Zoster 2014   R flank   Past Surgical History:  Procedure Laterality Date   ABDOMINAL HYSTERECTOMY     still has 1 ovary    APPENDECTOMY     BRAIN SURGERY  1995   Stem surgery, Arnold Chiari Malformation    CATARACT EXTRACTION Bilateral 2017   cataracts, Dr. Elmer Picker   CHOLECYSTECTOMY     COLONOSCOPY     ESOPHAGEAL DILATION N/A 02/06/2023   Procedure: ESOPHAGEAL DILATION;  Surgeon: Corliss Skains, MD;  Location: [facility] OR;  Service: Thoracic;  Laterality: N/A;   ESOPHAGEAL DILATION N/A 02/13/2023   Procedure: ESOPHAGEAL DILATION;  Surgeon: Corliss Skains, MD;  Location: [facility] OR;  Service: Thoracic;  Laterality: N/A;   ESOPHAGEAL DILATION N/A 03/12/2023   Procedure: ESOPHAGEAL DILATION;  Surgeon: Corliss Skains, MD;  Location: [facility] OR;  Service: Thoracic;  Laterality: N/A;   ESOPHAGEAL MANOMETRY N/A 08/06/2019   Procedure: ESOPHAGEAL MANOMETRY (EM);  Surgeon: Iva Boop, MD;  Location: [facility] ENDOSCOPY;  Service: Endoscopy;  Laterality: N/A;   ESOPHAGOGASTRODUODENOSCOPY N/A 02/06/2023   Procedure: ESOPHAGOGASTRODUODENOSCOPY (EGD);  Surgeon: Corliss Skains, MD;  Location: [facility] OR;  Service: Thoracic;  Laterality: N/A;  With Savary dilation   ESOPHAGOGASTRODUODENOSCOPY N/A 02/13/2023   Procedure: ESOPHAGOGASTRODUODENOSCOPY (EGD);  Surgeon: Corliss Skains, MD;  Location: [facility] OR;  Service:  Thoracic;  Laterality: N/A;   ESOPHAGOGASTRODUODENOSCOPY N/A 03/12/2023   Procedure: ESOPHAGOGASTRODUODENOSCOPY (EGD);  Surgeon: Corliss Skains, MD;  Location: [facility] OR;  Service: Thoracic;  Laterality: N/A;   HEMORRHOID BANDING     HERNIA REPAIR     Hital Hernia   JOINT REPLACEMENT Right    knee   KNEE ARTHROSCOPY  05/16/2012   Procedure: ARTHROSCOPY KNEE;  Surgeon: Kennieth Rad, MD;  Location: [facility] OR;  Service: Orthopedics;  Laterality: Left;   KNEE ARTHROSCOPY Right 01/2018   NISSEN FUNDOPLICATION     X 3 lab, open x 2 last with mesh   POLYPECTOMY     REDUCTION MAMMAPLASTY Bilateral 2007   TOTAL KNEE ARTHROPLASTY Right 08/25/2019   Procedure: RIGHT TOTAL KNEE ARTHROPLASTY;  Surgeon: Ollen Gross, MD;  Location: [facility] ORS;  Service: Orthopedics;  Laterality: Right;    UPPER  GASTROINTESTINAL ENDOSCOPY     Patient Active Problem List   Diagnosis Date Noted   Vitamin D deficiency 12/12/2022   Decreased GFR 12/12/2022   Physically inactive 12/12/2022   Calculus of kidney 07/14/2021   OSA on CPAP 06/08/2021   Circadian rhythm sleep disorder, delayed sleep phase type 06/08/2021   Glaucoma suspect 06/25/2020   S/P total knee arthroplasty, right 08/27/2019   OA (osteoarthritis) of knee 08/25/2019   Primary osteoarthritis of right knee 08/25/2019   Atypical chest pain    Hemorrhoids, internal, with bleeding 06/27/2017   IDA (iron deficiency anemia) 02/20/2017   Chronic fatigue 02/03/2017   Left leg pain 11/29/2016   History of colonic polyps 12/02/2015   PCP NOTES >>>>>>>>>>>>>>>>>>>>>>>>> 02/13/2015   Anxiety 08/04/2014   Annual physical exam 04/29/2014   Fibromyalgia syndrome 02/04/2014   Dysphagia 01/20/2014   Chronic cough 01/08/2014   DOE (dyspnea on exertion) 12/26/2013   Diabetes (HCC) 09/24/2013   Abdominal pain, chronic, right flank -upper quadrant 07/02/2012   Class 2 severe obesity with serious comorbidity and body mass index (BMI) of 38.0 to 38.9 in adult [facility]) 09/30/2009   Hypersomnia with sleep apnea 09/28/2009   Essential hypertension 06/24/2009   DIZZINESS 06/24/2009   HEADACHE 06/24/2009   VENOUS INSUFFICIENCY, LEGS 11/19/2008   Acute pulmonary embolism (HCC) 05/19/2008   Pulmonary nodule 08/26/2007   Stricture and stenosis of esophagus 05/22/2007   GERD 01/31/2007   IBS (irritable bowel syndrome) 01/31/2007   MITRAL VALVE PROLAPSE, HX OF 01/31/2007    PCP: Willow Ora MD  REFERRING PROVIDER: Venita Lick, MD   REFERRING DIAG: M54.50 (ICD-10-CM) - Low back pain   Rationale for Evaluation and Treatment: Rehabilitation  THERAPY DIAG:  Other low back pain  Muscle weakness (generalized)  Other abnormalities of gait and mobility  ONSET DATE: Last 2 yrs  SUBJECTIVE:  SUBJECTIVE STATEMENT: Pt states her back feels better after doing the aquatics. Pt states she did take some tylenol today. Has tried not to wear the pain patch.   POOL ACCESS: currently none, but considering membership at [facility] or elsewhere.  Initial subjective Flare of LBP that has stayed for 3 weeks this time.  Thought it was my bed ??.  Using the pain patches every once in a while and they help. I wake up with pain. R TKR ~ 5 yrs ago.   PERTINENT HISTORY:   Lumbar spondylosis  PAIN:  Are you having pain? Yes: NPRS scale: currently 3-4/10 Pain location: LB Pain description: ache Aggravating factors: sitting >30 minutes, lying on left side or too long on back Relieving factors: heating pad, pain patches  PRECAUTIONS: None  RED FLAGS: None   WEIGHT BEARING RESTRICTIONS: No  FALLS:  Has patient fallen in last 6 months? No   OCCUPATION: retired; Clinical cytogeneticist  PLOF: Independent  PATIENT GOALS: improve strength, get back into exercise.  NEXT MD VISIT: as needed  OBJECTIVE:  Note: Objective measures were completed at Evaluation unless otherwise noted.  DIAGNOSTIC FINDINGS:  Imaging from today does demonstrate mild degenerative disc disease at L4-L5 and L5-S1   PATIENT SURVEYS:  Modified Oswestry 15/45   COGNITION: Overall cognitive status: Within functional limits for tasks assessed     SENSATION: WFL  MUSCLE LENGTH: Hamstrings: Bilateral hamstring    POSTURE: rounded shoulders, forward head, and left knee valgus  PALPATION: Center l4-5; L5-S1  LUMBAR ROM:  wfl  LOWER EXTREMITY ROM:     wfl  LOWER EXTREMITY MMT:    MMT Right eval Left eval R / L 4/8  Hip flexion 40.6 37.1 21.1 / 36.1  Hip extension     Hip abduction 27.4 26.7 25.9 / 20.4  Hip adduction     Hip internal rotation     Hip external rotation     Knee  flexion     Knee extension 47.1 29.3 29.4/ 30.2  Ankle dorsiflexion     Ankle plantarflexion     Ankle inversion     Ankle eversion      (Blank rows = not tested)  LUMBAR SPECIAL TESTS:  Slump test: Negative  FUNCTIONAL TESTS:  30 s STS 4 from pool bench with ue assist (norm 10-14 ) Timed up and go (TUG): 14.59 4 stage balance: passed 1&2; Tandem x 10s (unsteady); SLS x 6s     07/31/23   30s STS from pool bench: 5   TUG 13.51   4 stage balance: SLS: 14s GAIT: Distance walked: 43ft Assistive device utilized: None Level of assistance: Complete Independence Comments: Left knee valgus knocking right.  TREATMENT  [facility] Adult PT Treatment:                                                DATE: 08/02/23 Therapeutic Exercise: Nustep L5 x 6 min using UEs/LEs Dead bugs 2x10 DKTC 2x30" SKTC x30" Neuromuscular re-ed: Supine PPT + diaphragmatic breaths 2x10 Supine bridging slow one lumbar segment up at a time and down 2x10 Feet on pball knee flex/ext with PPT x10 Sidelying hip abd to fatigue Standing PPT x10 against wall    [facility] Adult PT Treatment:  DATE: 07/31/23 Pt seen for aquatic therapy today.  Treatment took place in water 3.5-4.75 ft in depth at the [facility] pool. Temp of water was 91.  Pt entered/exited the pool via stairs with hand rail.   *unsupported: walking forward/ backward in 4+ ft of water 4 laps,  *side stepping ue RBHB ue add/abd *Farmers carry yellow HB bilaterally then unilaterally Standing resisted row using rider band elbow straight wide stance then staggered x 10.  Cues for engaged core * wide stance with reciprocal arm swing with medium resistance bells;3 sets 5 slow/5 fast * TrA set with 1/2 hollow noodle->full hollow noodle pull down to thighs x 10 wide stance then staggered stance * Straddled yellow noodle, extra noodle under arms behind back: cycling multiple laps   Pt requires the buoyancy and  hydrostatic pressure of water for support, and to offload joints by unweighting joint load by at least 50 % in navel deep water and by at least 75-80% in chest to neck deep water.  Viscosity of the water is needed for resistance of strengthening. Water current perturbations provides challenge to standing balance requiring increased core activation.                    PATIENT EDUCATION:  Education details: rationale for interventions Person educated: Patient Education method: Explanation, Demonstration, Tactile cues, Verbal cues Education comprehension: verbalized understanding, returned demonstration, verbal cues required, tactile cues required, and needs further education     HOME EXERCISE PROGRAM: Access Code: [email or web address] URL: [email or web address] Date: 08/02/2023 Prepared by: Vernon Prey April Kirstie Peri  Exercises - Heel Raises with Counter Support  - 2-3 x daily - 1 sets - 8-10 reps - Supine Bridge  - 1 x daily - 7 x weekly - 2 sets - 10 reps - Sidelying Hip Abduction  - 1 x daily - 7 x weekly - 2 sets - 10 reps - Dead Bug  - 1 x daily - 7 x weekly - 2 sets - 10 reps - Standing with Back Flat Against Wall  - 1 x daily - 7 x weekly - 2 sets - 10 reps  ASSESSMENT:  CLINICAL IMPRESSION: 08/02/2023 Treatment focused on updating pt's land HEP. Pt feels good with her back pressed back and supported so performed TSA activation with back supported. Able to obtain good core contraction today. Challenged with sidelying hip abd.    PN: pt reports her pain is down ~ 40% since start of therapy.  She has transitioned onto land based intervention with fair toleration so far. She has met all of her STG and is progressing towards her LTG.  Progress is slow due to pain. She is flared today which has skewed her strength testing demonstrating a decrease in RLE in hip flex and knee ext.  She has slightly improved her 30 S STS and TUG indicating an improvement in transitional movements and  balance.  She will continue to benefit from skilled PT intervention. Discussions going forward with potential for pool access as she VU of chronic condition that will need to be managed.  Will see pt predominantly in aquatics but intermix land based for further progression of strength and toleration to activity and towards meeting all land based goals.   OBJECTIVE IMPAIRMENTS: Abnormal gait, decreased activity tolerance, decreased balance, impaired flexibility, and pain.   ACTIVITY LIMITATIONS: sleeping, stairs, transfers, and locomotion level  PARTICIPATION LIMITATIONS: shopping and community activity  PERSONAL FACTORS: Fitness are also affecting patient's functional outcome.  REHAB POTENTIAL: Good  CLINICAL DECISION MAKING: Evolving/moderate complexity  EVALUATION COMPLEXITY: Moderate   GOALS: Goals reviewed with patient? Yes  SHORT TERM GOALS: Target date: 07/07/23  Pt will tolerate full aquatic sessions consistently without increase in pain and with improving function to demonstrate good toleration and effectiveness of intervention.  Baseline: 07/10/23: reports improving tolerance w/ aquatics Goal status: MET  2. Pt will report walking x 15 minutes 5/7 days a week to demonstrate committment to increasing her physical activity Baseline: walking 30 min (on walking pad)  Goal status: MET - 07/17/23  3.  Pt will report a reduction of LBP by at least 25% Baseline: see chart 07/10/23: continues to endorse fluctuations 07/25/23: 40% Goal status: Met 07/25/23   LONG TERM GOALS: Target date: 09/14/23  Pt to improve on ODI by  20 % to demonstrate statistically significant Improvement in function. Baseline: 33% Goal status: INITIAL  2.  Pt will be indep with final HEP's (land and aquatic as appropriate) for continued management of condition Baseline:  Goal status: INITIAL  3.  Pt will report decrease in pain by at least 75% for improved toleration to activity/quality of life and to  demonstrate improved management of pain.  Baseline: see chart; ~40% 07/31/23 Goal status: In progress  4.  Pt will improve strength in LE up towards 10lbs to demonstrate improved overall physical function Baseline: see chart Goal status: In progress 07/31/23  5.  Pt will improve on 30s STS test to <or= 8 to demonstrate improving functional lower extremity strength, transitional movements, and balance Baseline: 4; 5 07/31/23 Goal status: In progress  6.  Pt will improve on Tug test to <or=13s (norm) to demonstrate improvement in lower extremity function, mobility and decreased fall risk. Baseline: 14.59; 13.51 (07/31/23) Goal status: In progress  PLAN:  PT FREQUENCY: 2x/week  PT DURATION: 6 weeks  PLANNED INTERVENTIONS: 97164- PT Re-evaluation, 97110-Therapeutic exercises, 97530- Therapeutic activity, 97112- Neuromuscular re-education, 97535- Self Care, 16109- Manual therapy, [phone]- Gait training, [phone]- Orthotic Fit/training, [phone]- Aquatic Therapy, [phone]- Ionotophoresis 4mg /ml Dexamethasone, Patient/Family education, Balance training, Stair training, Taping, Dry Needling, Joint mobilization, Cryotherapy, and Moist heat.  PLAN FOR NEXT SESSION: aquatic sessions for pain management and to initiate muscle lengthening/strengthening and balance then incorporate land based intervention for added load bearing strengthening and final HEP for continued management of chronic condition   [facility] April Dell Ponto, PT, DPT 08/02/23 2:38 PM [facility] [address] [place], [place], [address] Phone: [phone]   Fax:  [phone]

## 2023-08-03 ENCOUNTER — Encounter (HOSPITAL_BASED_OUTPATIENT_CLINIC_OR_DEPARTMENT_OTHER): Payer: Medicare Other | Admitting: Physical Therapy

## 2023-08-08 ENCOUNTER — Ambulatory Visit (HOSPITAL_BASED_OUTPATIENT_CLINIC_OR_DEPARTMENT_OTHER): Payer: Self-pay | Admitting: Physical Therapy

## 2023-08-08 ENCOUNTER — Encounter (HOSPITAL_BASED_OUTPATIENT_CLINIC_OR_DEPARTMENT_OTHER): Payer: Self-pay | Admitting: Physical Therapy

## 2023-08-08 DIAGNOSIS — R2689 Other abnormalities of gait and mobility: Secondary | ICD-10-CM | POA: Diagnosis not present

## 2023-08-08 DIAGNOSIS — M6281 Muscle weakness (generalized): Secondary | ICD-10-CM

## 2023-08-08 DIAGNOSIS — M5459 Other low back pain: Secondary | ICD-10-CM | POA: Diagnosis not present

## 2023-08-08 NOTE — Therapy (Signed)
 OUTPATIENT PHYSICAL THERAPY THORACOLUMBAR TR/re-cert   Patient Name: Jennifer [name] MRN: [id number] DOB:03/21/1951, 73 y.o., female Today's Date: 08/08/2023  END OF SESSION:  PT End of Session - 08/08/23 1534     Visit Number 12    Number of Visits 22    Date for PT Re-Evaluation 09/14/23    Authorization Type medicare    Progress Note Due on Visit 20    PT Start Time 1532    PT Stop Time 1610    PT Time Calculation (min) 38 min    Activity Tolerance Patient tolerated treatment well    Behavior During Therapy [facility] for tasks assessed/performed              Past Medical History:  Diagnosis Date   Allergy    Anemia    Arthritis    Cataract    bil cataracts removed   Chronic chest pain    Chronic fatigue 02/03/2017   Clotting disorder (HCC)    Pulmonary Embolis 2010 & 2015   Colon polyps    inflamed partially ulcerated hyperplastic polyp   Diabetes (HCC) 09/24/2013   no meds   Diverticulosis    Dysphagia esophageal phase - chronic after 3 fundoplications 01/20/2014   Edema of both lower extremities    Esophageal stenosis    Stricture after fundoplication REDO   Fibromyalgia    GERD (gastroesophageal reflux disease)    Glaucoma suspect    Heart murmur    no problems    Hemorrhoids, internal, with bleeding 06/27/2017   History of hiatal hernia    Hypertension    IBS (irritable bowel syndrome)    Lactose intolerance    Mitral valve prolapse    OSA (obstructive sleep apnea)    on CPAP-Mild - Sleep study 2004   Pre-diabetes    Pulmonary embolus (HCC)    2010 and 12-2013   Pulmonary nodule 12/13/2022   seen on CT chest xray on 12/16/22 - left upper lobe   Sleep apnea    wears CPAP   Thyroid nodule      dr. watching    Tinnitus    Subjective   Zoster 2014   R flank   Past Surgical History:  Procedure Laterality Date   ABDOMINAL HYSTERECTOMY     still has 1 ovary    APPENDECTOMY     BRAIN SURGERY  1995   Stem surgery, Arnold Chiari Malformation    CATARACT EXTRACTION Bilateral 2017   cataracts, Dr. Lasandra Points   CHOLECYSTECTOMY     COLONOSCOPY     ESOPHAGEAL DILATION N/A 02/06/2023   Procedure: ESOPHAGEAL DILATION;  Surgeon: Hilarie Lovely, MD;  Location: [facility] OR;  Service: Thoracic;  Laterality: N/A;   ESOPHAGEAL DILATION N/A 02/13/2023   Procedure: ESOPHAGEAL DILATION;  Surgeon: Hilarie Lovely, MD;  Location: [facility] OR;  Service: Thoracic;  Laterality: N/A;   ESOPHAGEAL DILATION N/A 03/12/2023   Procedure: ESOPHAGEAL DILATION;  Surgeon: Hilarie Lovely, MD;  Location: [facility] OR;  Service: Thoracic;  Laterality: N/A;   ESOPHAGEAL MANOMETRY N/A 08/06/2019   Procedure: ESOPHAGEAL MANOMETRY (EM);  Surgeon: Kenney Peacemaker, MD;  Location: [facility] ENDOSCOPY;  Service: Endoscopy;  Laterality: N/A;   ESOPHAGOGASTRODUODENOSCOPY N/A 02/06/2023   Procedure: ESOPHAGOGASTRODUODENOSCOPY (EGD);  Surgeon: Hilarie Lovely, MD;  Location: [facility] OR;  Service: Thoracic;  Laterality: N/A;  With Savary dilation   ESOPHAGOGASTRODUODENOSCOPY N/A 02/13/2023   Procedure: ESOPHAGOGASTRODUODENOSCOPY (EGD);  Surgeon: Hilarie Lovely, MD;  Location[facility] OR;  Service:  Thoracic;  Laterality: N/A;   ESOPHAGOGASTRODUODENOSCOPY N/A 03/12/2023   Procedure: ESOPHAGOGASTRODUODENOSCOPY (EGD);  Surgeon: Hilarie Lovely, MD;  Location: [facility] OR;  Service: Thoracic;  Laterality: N/A;   HEMORRHOID BANDING     HERNIA REPAIR     Hital Hernia   JOINT REPLACEMENT Right    knee   KNEE ARTHROSCOPY  05/16/2012   Procedure: ARTHROSCOPY KNEE;  Surgeon: Jonna Netter, MD;  Location: [facility] OR;  Service: Orthopedics;  Laterality: Left;   KNEE ARTHROSCOPY Right 01/2018   NISSEN FUNDOPLICATION     X 3 lab, open x 2 last with mesh   POLYPECTOMY     REDUCTION MAMMAPLASTY Bilateral 2007   TOTAL KNEE ARTHROPLASTY Right 08/25/2019   Procedure: RIGHT TOTAL KNEE ARTHROPLASTY;  Surgeon: Liliane Rei, MD;  Location: [facility] ORS;  Service: Orthopedics;  Laterality: Right;    UPPER  GASTROINTESTINAL ENDOSCOPY     Patient Active Problem List   Diagnosis Date Noted   Vitamin D deficiency 12/12/2022   Decreased GFR 12/12/2022   Physically inactive 12/12/2022   Calculus of kidney 07/14/2021   OSA on CPAP 06/08/2021   Circadian rhythm sleep disorder, delayed sleep phase type 06/08/2021   Glaucoma suspect 06/25/2020   S/P total knee arthroplasty, right 08/27/2019   OA (osteoarthritis) of knee 08/25/2019   Primary osteoarthritis of right knee 08/25/2019   Atypical chest pain    Hemorrhoids, internal, with bleeding 06/27/2017   IDA (iron deficiency anemia) 02/20/2017   Chronic fatigue 02/03/2017   Left leg pain 11/29/2016   History of colonic polyps 12/02/2015   PCP NOTES >>>>>>>>>>>>>>>>>>>>>>>>> 02/13/2015   Anxiety 08/04/2014   Annual physical exam 04/29/2014   Fibromyalgia syndrome 02/04/2014   Dysphagia 01/20/2014   Chronic cough 01/08/2014   DOE (dyspnea on exertion) 12/26/2013   Diabetes (HCC) 09/24/2013   Abdominal pain, chronic, right flank -upper quadrant 07/02/2012   Class 2 severe obesity with serious comorbidity and body mass index (BMI) of 38.0 to 38.9 in adult [facility]) 09/30/2009   Hypersomnia with sleep apnea 09/28/2009   Essential hypertension 06/24/2009   DIZZINESS 06/24/2009   HEADACHE 06/24/2009   VENOUS INSUFFICIENCY, LEGS 11/19/2008   Acute pulmonary embolism (HCC) 05/19/2008   Pulmonary nodule 08/26/2007   Stricture and stenosis of esophagus 05/22/2007   GERD 01/31/2007   IBS (irritable bowel syndrome) 01/31/2007   MITRAL VALVE PROLAPSE, HX OF 01/31/2007    PCP: Devonna Foley MD  REFERRING PROVIDER: Mort Ards, MD   REFERRING DIAG: M54.50 (ICD-10-CM) - Low back pain   Rationale for Evaluation and Treatment: Rehabilitation  THERAPY DIAG:  Other low back pain  Muscle weakness (generalized)  Other abnormalities of gait and mobility  ONSET DATE: Last 2 yrs  SUBJECTIVE:  SUBJECTIVE STATEMENT: Pt states she tired. . . Had grand dtr over w/e and worked in the yard on Monday.  Reports compliance with HEP.  POOL ACCESS: currently none, but considering membership at [facility] or elsewhere.  Initial subjective Flare of LBP that has stayed for 3 weeks this time.  Thought it was my bed ??.  Using the pain patches every once in a while and they help. I wake up with pain. R TKR ~ 5 yrs ago.   PERTINENT HISTORY:   Lumbar spondylosis  PAIN:  Are you having pain? Yes: NPRS scale: currently 5/10 Pain location: LB Pain description: ache Aggravating factors: sitting >30 minutes, lying on left side or too long on back Relieving factors: heating pad, pain patches  PRECAUTIONS: None  RED FLAGS: None   WEIGHT BEARING RESTRICTIONS: No  FALLS:  Has patient fallen in last 6 months? No   OCCUPATION: retired; Clinical cytogeneticist  PLOF: Independent  PATIENT GOALS: improve strength, get back into exercise.  NEXT MD VISIT: as needed  OBJECTIVE:  Note: Objective measures were completed at Evaluation unless otherwise noted.  DIAGNOSTIC FINDINGS:  Imaging from today does demonstrate mild degenerative disc disease at L4-L5 and L5-S1   PATIENT SURVEYS:  Modified Oswestry 15/45   COGNITION: Overall cognitive status: Within functional limits for tasks assessed     SENSATION: WFL  MUSCLE LENGTH: Hamstrings: Bilateral hamstring    POSTURE: rounded shoulders, forward head, and left knee valgus  PALPATION: Center l4-5; L5-S1  LUMBAR ROM:  wfl  LOWER EXTREMITY ROM:     wfl  LOWER EXTREMITY MMT:    MMT Right eval Left eval R / L 4/8  Hip flexion 40.6 37.1 21.1 / 36.1  Hip extension     Hip abduction 27.4 26.7 25.9 / 20.4  Hip adduction     Hip internal rotation     Hip external rotation     Knee flexion     Knee extension 47.1  29.3 29.4/ 30.2  Ankle dorsiflexion     Ankle plantarflexion     Ankle inversion     Ankle eversion      (Blank rows = not tested)  LUMBAR SPECIAL TESTS:  Slump test: Negative  FUNCTIONAL TESTS:  30 s STS 4 from pool bench with ue assist (norm 10-14 ) Timed up and go (TUG): 14.59 4 stage balance: passed 1&2; Tandem x 10s (unsteady); SLS x 6s     07/31/23   30s STS from pool bench: 5   TUG 13.51   4 stage balance: SLS: 14s GAIT: Distance walked: 45ft Assistive device utilized: None Level of assistance: Complete Independence Comments: Left knee valgus knocking right.  TREATMENT  [facility] Adult PT Treatment:                                                DATE: 08/08/23 Pt seen for aquatic therapy today.  Treatment took place in water 3.5-4.75 ft in depth at the [facility] pool. Temp of water was 91.  Pt entered/exited the pool via stairs with hand rail.   *unsupported: walking forward/ backward in 4+ ft of water 4 laps,  *side stepping ue RBHB ue add/abd *Farmers carry yellow HB bilaterally then unilaterally forward, back *side stepping with rbhb  shoulder add/abd x 2 widths-> side lunge x 4 widths * wide stance with reciprocal arm swing  with medium resistance bells;3 sets 6 slow/6 fast then staggered stances 2 x 5 fast/slow * Straddled yellow noodle,: cycling multiple laps   Pt requires the buoyancy and hydrostatic pressure of water for support, and to offload joints by unweighting joint load by at least 50 % in navel deep water and by at least 75-80% in chest to neck deep water.  Viscosity of the water is needed for resistance of strengthening. Water current perturbations provides challenge to standing balance requiring increased core activation.   [facility] Adult PT Treatment:                                                DATE: 08/02/23 Therapeutic Exercise: Nustep L5 x 6 min using UEs/LEs Dead bugs 2x10 DKTC 2x30" SKTC x30" Neuromuscular re-ed: Supine PPT +  diaphragmatic breaths 2x10 Supine bridging slow one lumbar segment up at a time and down 2x10 Feet on pball knee flex/ext with PPT x10 Sidelying hip abd to fatigue Standing PPT x10 against wall                       PATIENT EDUCATION:  Education details: rationale for interventions Person educated: Patient Education method: Explanation, Demonstration, Tactile cues, Verbal cues Education comprehension: verbalized understanding, returned demonstration, verbal cues required, tactile cues required, and needs further education     HOME EXERCISE PROGRAM: Access Code: [email or web address] URL: [email or web address] Date: 08/02/2023 Prepared by: Gellen April Erman Hayward  Exercises - Heel Raises with Counter Support  - 2-3 x daily - 1 sets - 8-10 reps - Supine Bridge  - 1 x daily - 7 x weekly - 2 sets - 10 reps - Sidelying Hip Abduction  - 1 x daily - 7 x weekly - 2 sets - 10 reps - Dead Bug  - 1 x daily - 7 x weekly - 2 sets - 10 reps - Standing with Back Flat Against Wall  - 1 x daily - 7 x weekly - 2 sets - 10 reps  ASSESSMENT:  CLINICAL IMPRESSION: 4/16/2025Progressed core strengthening but decreased total challenges today due to tiredness. She is progressing well with toleration to activity being very active over w/e. She tolerates well and had reduction in pain (slightly elevated today) by using extra time to decompress and cycle on noodle. Reduction of pain to 1/10 end of session. She reports compliance with HEP an states the PPT relieve her back discomfort.  Goals ongoing \  PN: pt reports her pain is down ~ 40% since start of therapy.  She has transitioned onto land based intervention with fair toleration so far. She has met all of her STG and is progressing towards her LTG.  Progress is slow due to pain. She is flared today which has skewed her strength testing demonstrating a decrease in RLE in hip flex and knee ext.  She has slightly improved her 30 S STS and TUG indicating  an improvement in transitional movements and balance.  She will continue to benefit from skilled PT intervention. Discussions going forward with potential for pool access as she VU of chronic condition that will need to be managed.  Will see pt predominantly in aquatics but intermix land based for further progression of strength and toleration to activity and towards meeting all land based goals.   OBJECTIVE IMPAIRMENTS: Abnormal gait, decreased activity tolerance, decreased  balance, impaired flexibility, and pain.   ACTIVITY LIMITATIONS: sleeping, stairs, transfers, and locomotion level  PARTICIPATION LIMITATIONS: shopping and community activity  PERSONAL FACTORS: Fitness are also affecting patient's functional outcome.   REHAB POTENTIAL: Good  CLINICAL DECISION MAKING: Evolving/moderate complexity  EVALUATION COMPLEXITY: Moderate   GOALS: Goals reviewed with patient? Yes  SHORT TERM GOALS: Target date: 07/07/23  Pt will tolerate full aquatic sessions consistently without increase in pain and with improving function to demonstrate good toleration and effectiveness of intervention.  Baseline: 07/10/23: reports improving tolerance w/ aquatics Goal status: MET  2. Pt will report walking x 15 minutes 5/7 days a week to demonstrate committment to increasing her physical activity Baseline: walking 30 min (on walking pad)  Goal status: MET - 07/17/23  3.  Pt will report a reduction of LBP by at least 25% Baseline: see chart 07/10/23: continues to endorse fluctuations 07/25/23: 40% Goal status: Met 07/25/23   LONG TERM GOALS: Target date: 09/14/23  Pt to improve on ODI by  20 % to demonstrate statistically significant Improvement in function. Baseline: 33% Goal status: INITIAL  2.  Pt will be indep with final HEP's (land and aquatic as appropriate) for continued management of condition Baseline:  Goal status: INITIAL  3.  Pt will report decrease in pain by at least 75% for improved  toleration to activity/quality of life and to demonstrate improved management of pain.  Baseline: see chart; ~40% 07/31/23 Goal status: In progress  4.  Pt will improve strength in LE up towards 10lbs to demonstrate improved overall physical function Baseline: see chart Goal status: In progress 07/31/23  5.  Pt will improve on 30s STS test to <or= 8 to demonstrate improving functional lower extremity strength, transitional movements, and balance Baseline: 4; 5 07/31/23 Goal status: In progress  6.  Pt will improve on Tug test to <or=13s (norm) to demonstrate improvement in lower extremity function, mobility and decreased fall risk. Baseline: 14.59; 13.51 (07/31/23) Goal status: In progress  PLAN:  PT FREQUENCY: 2x/week  PT DURATION: 6 weeks  PLANNED INTERVENTIONS: 97164- PT Re-evaluation, 97110-Therapeutic exercises, 97530- Therapeutic activity, 97112- Neuromuscular re-education, 97535- Self Care, 60454- Manual therapy, [phone]- Gait training, [phone]- Orthotic Fit/training, [phone]- Aquatic Therapy, [phone]- Ionotophoresis 4mg /ml Dexamethasone, Patient/Family education, Balance training, Stair training, Taping, Dry Needling, Joint mobilization, Cryotherapy, and Moist heat.  PLAN FOR NEXT SESSION: aquatic sessions for pain management and to initiate muscle lengthening/strengthening and balance then incorporate land based intervention for added load bearing strengthening and final HEP for continued management of chronic condition   Frankie Saheed Carrington, PT, 08/08/23 3:51 PM [facility] [address] [place], [place], [address] Phone: [phone]   Fax:  [phone]

## 2023-08-09 ENCOUNTER — Encounter: Payer: Self-pay | Admitting: Internal Medicine

## 2023-08-15 IMAGING — RF DG ESOPHAGUS
12 of 17 series · 14 of 24 positions shown · non-contrast
Comparison: 06/25/2006 esophagram.  06/24/2019 esophagram.

CLINICAL DATA: History of Nissen fundoplication x3, most recently
approximally 8535. History of multiple esophageal dilatations, most
the throat and dysphagia with globus sensation. Recently underwent
upper endoscopy with esophageal changes suspicious for Dehwa
esophagitis. Biopsies were performed. No dilatation was performed.

EXAM:
ESOPHAGUS/BARIUM SWALLOW/TABLET STUDY
TECHNIQUE: Combined double and single contrast examination was performed using
effervescent crystals, high-density barium, and thin liquid barium.
This exam was performed by Abdon Tuck, and was supervised
and interpreted by Dr. Alyshia Tavares.
FLUOROSCOPY TIME:  Radiation Exposure Index (as provided by the
fluoroscopic device): 40.2 mGy
If the device does not provide the exposure index:
Fluoroscopy Time:  3 minutes, 0 seconds
Number of Acquired Images:  Fluoro store images only.

[Series 1: cp_standard · 1 of 64 frames shown (1 of 12)]
[frame 10/64]
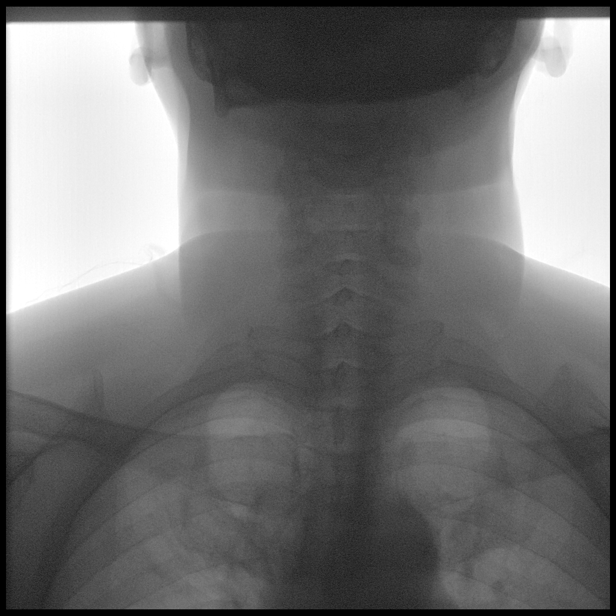

[Series 2: cp_standard · 1 of 67 frames shown (2 of 12)]
[frame 57/67]
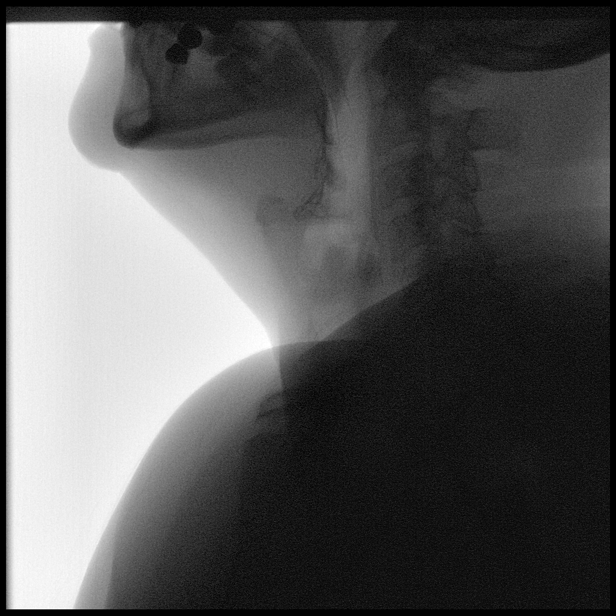

[Series 7: cp_standard · 1 of 37 frames shown (3 of 12)]
[frame 1/37]
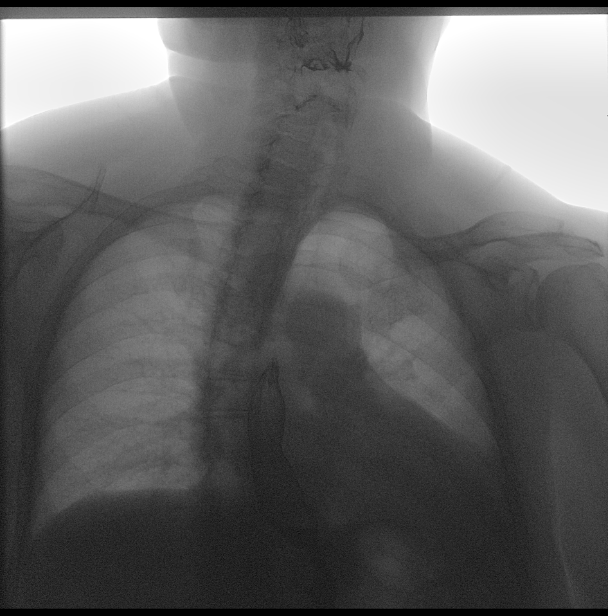

[Series 9: cp_standard · 2 of 85 frames shown (4 of 12)]
[frame 13/85]
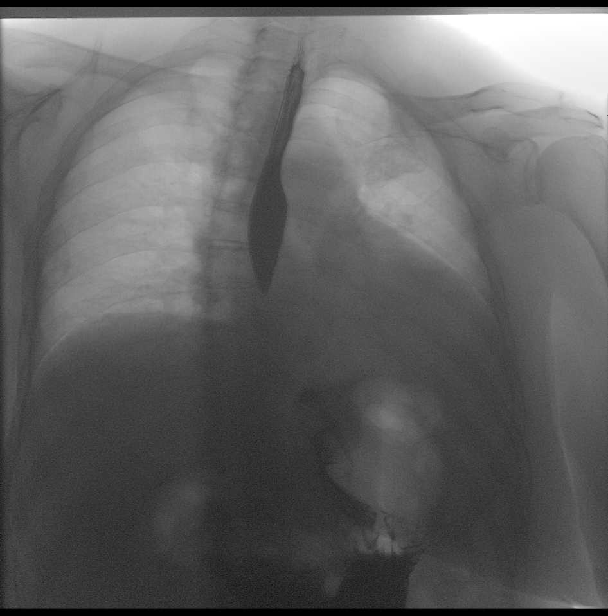
[frame 76/85]
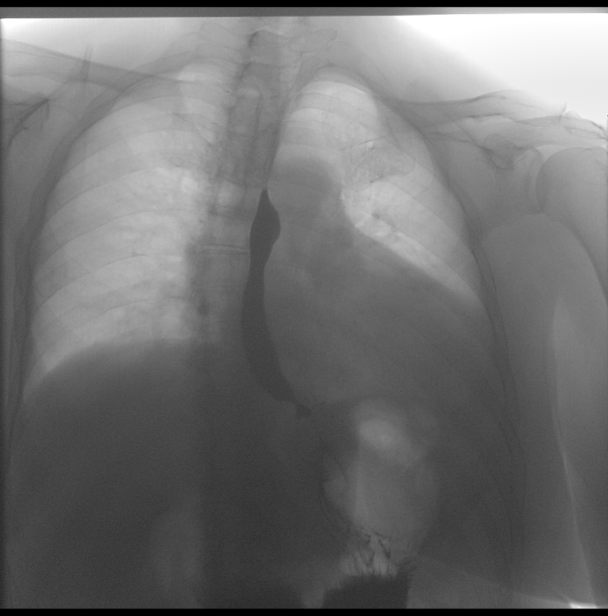

[Series 12: cp_standard · 1 of 1 slices shown (5 of 12)]
[im 1/1]
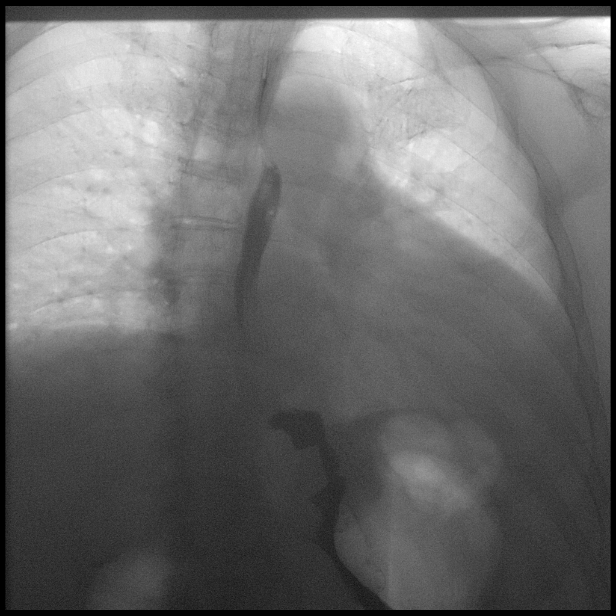

[Series 17: cp_standard · 2 of 155 frames shown (6 of 12)]
[frame 24/155]
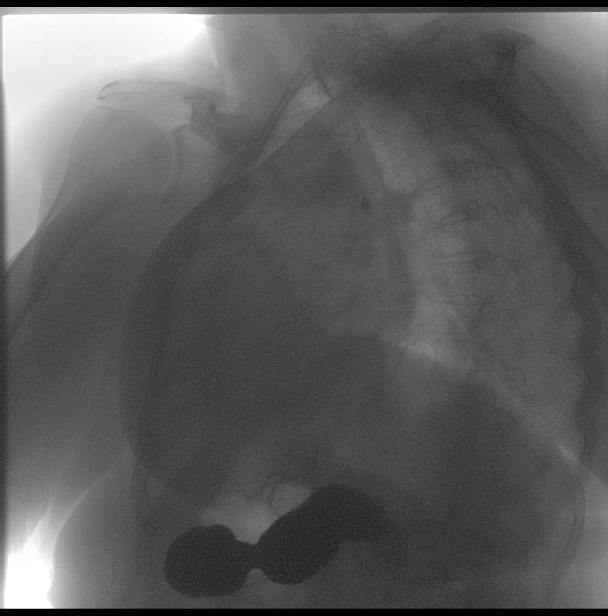
[frame 153/155]
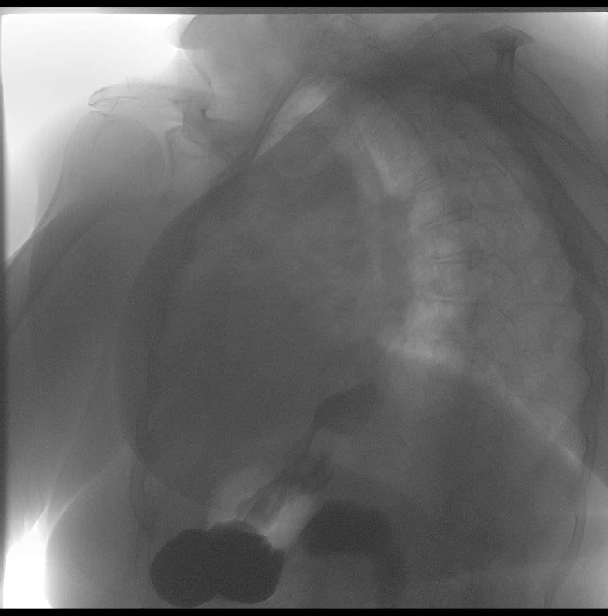

[Series 22: cp_standard · 1 of 1 slices shown (7 of 12)]
[im 1/1]
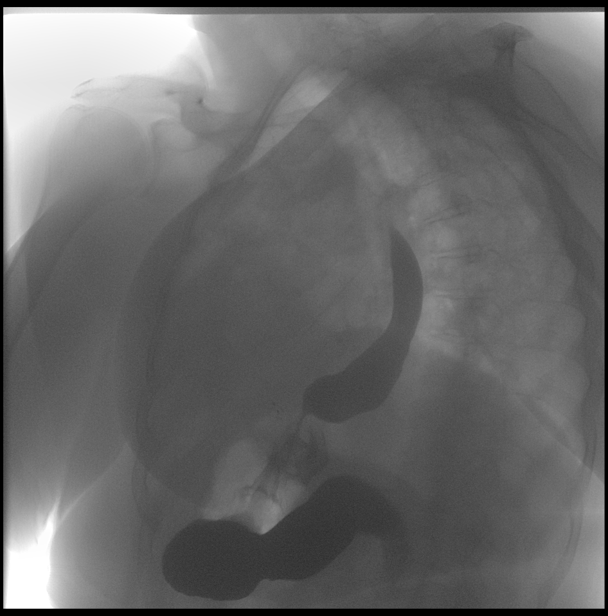

[Series 28: cp_standard · 1 of 110 frames shown (8 of 12)]
[frame 17/110]
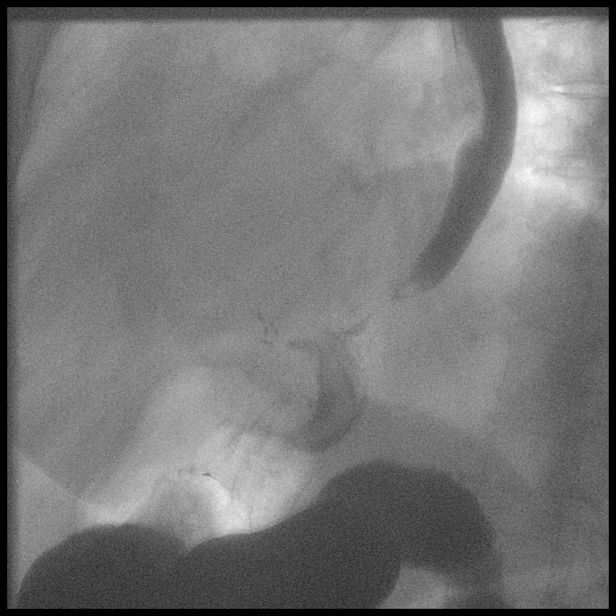

[Series 29: cp_standard · 1 of 99 frames shown (9 of 12)]
[frame 50/99]
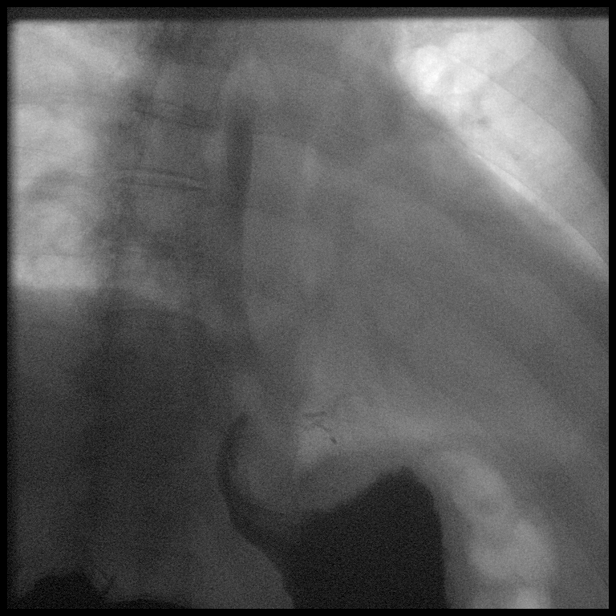

[Series 30: cp_standard · 1 of 153 frames shown (10 of 12)]
[frame 23/153]
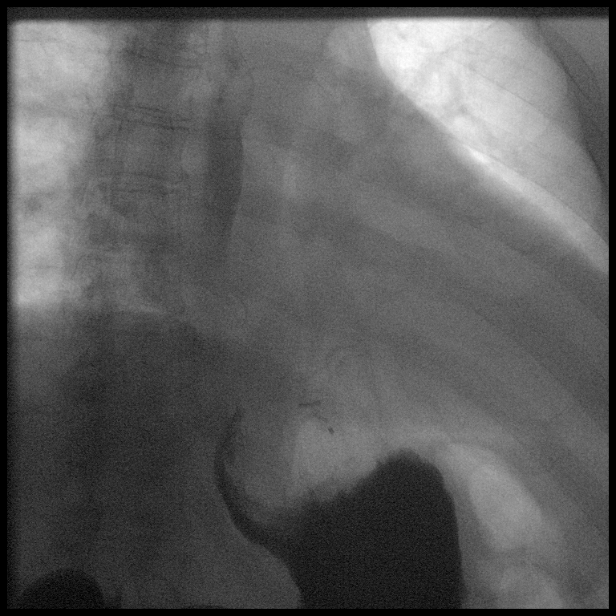

[Series 31: cp_standard · 1 of 56 frames shown (11 of 12)]
[frame 48/56]
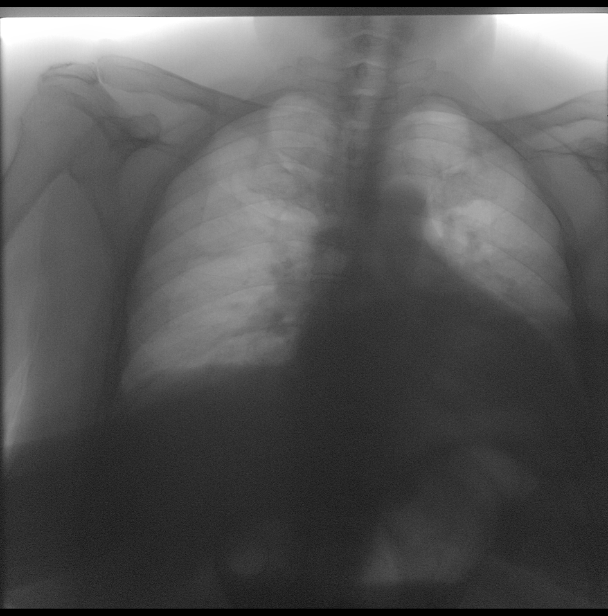

[Series 33: cp_standard · 1 of 187 frames shown (12 of 12)]
[frame 159/187]
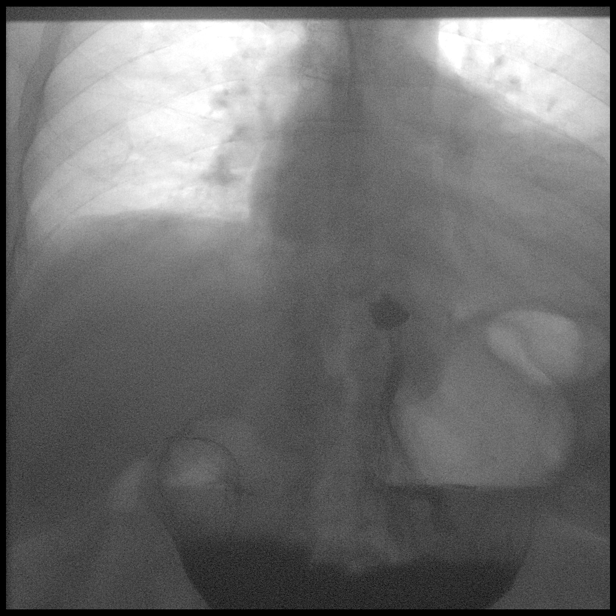

[14 of 24 positions shown; findings below may reference images not displayed]

FINDINGS: Swallowing: Normal oral and pharyngeal phases of swallowing with no
vestibular penetration or aspiration seen.

Pharynx: Unremarkable. No evidence of pharyngeal mass, stricture or
diverticulum.

Esophagus: Evidence of residual caliber transition in the lower
thoracic esophagus near the esophagogastric junction, smooth
margins. This is noted to be less severe than on previous study.
Esophageal motility is within normal limits.

Hiatal Hernia: Intact appearing Nissen fundoplication, no hiatal
hernia.

Gastroesophageal reflux: None visualized despite provocative
maneuvers such as coughing and water siphon test.

Ingested 13mm barium tablet: Was administered, and did not pass
through the gastroesophageal junction despite multiple swallows of
water and barium.

Other: None.
IMPRESSION: Evidence of residual esophageal stricture near the esophagogastric
junction, not as severe as on previous study.

Nissen fundoplication intact without hiatal hernia. No evidence of
reflux or mucosal irregularity. Normal esophageal motility.

## 2023-08-17 ENCOUNTER — Encounter: Payer: Self-pay | Admitting: Internal Medicine

## 2023-08-17 ENCOUNTER — Ambulatory Visit (INDEPENDENT_AMBULATORY_CARE_PROVIDER_SITE_OTHER): Admitting: Internal Medicine

## 2023-08-17 VITALS — BP 122/76 | HR 84 | Temp 98.1°F | Resp 18 | Ht 68.0 in | Wt 232.2 lb

## 2023-08-17 DIAGNOSIS — R040 Epistaxis: Secondary | ICD-10-CM | POA: Diagnosis not present

## 2023-08-17 DIAGNOSIS — F419 Anxiety disorder, unspecified: Secondary | ICD-10-CM

## 2023-08-17 DIAGNOSIS — E119 Type 2 diabetes mellitus without complications: Secondary | ICD-10-CM | POA: Diagnosis not present

## 2023-08-17 DIAGNOSIS — F32A Depression, unspecified: Secondary | ICD-10-CM | POA: Diagnosis not present

## 2023-08-17 DIAGNOSIS — Z7985 Long-term (current) use of injectable non-insulin antidiabetic drugs: Secondary | ICD-10-CM | POA: Diagnosis not present

## 2023-08-17 MED ORDER — ESCITALOPRAM OXALATE 10 MG PO TABS: 10.0000 mg | ORAL_TABLET | Freq: Every day | ORAL | 6 refills | Status: DC

## 2023-08-17 NOTE — Progress Notes (Addendum)
 Subjective:    Patient ID: Jennifer [name], female    DOB: 12-Aug-1950, 73 y.o.   MRN: [id number]  DOS:  08/17/2023 Type of visit - description: acute  States anxiety is coming back, it is only when she drives that she gets very nervous. Previously try Prozac  which help but she had some side effects  Also, having right-sided nosebleed when she blows her nose.  No left-sided problems. Does not happen every day but is on and off.   Review of Systems See above   Past Medical History:  Diagnosis Date   Allergy    Anemia    Arthritis    Cataract    bil cataracts removed   Chronic chest pain    Chronic fatigue 02/03/2017   Clotting disorder [facility])    Pulmonary [place] 2010 & 2015   Colon polyps    inflamed partially ulcerated hyperplastic polyp   Diabetes (HCC) 09/24/2013   no meds   Diverticulosis    Dysphagia esophageal phase - chronic after 3 fundoplications 01/20/2014   Edema of both lower extremities    Esophageal stenosis    Stricture after fundoplication REDO   Fibromyalgia    GERD (gastroesophageal reflux disease)    Glaucoma suspect    Heart murmur    no problems    Hemorrhoids, internal, with bleeding 06/27/2017   History of hiatal hernia    Hypertension    IBS (irritable bowel syndrome)    Lactose intolerance    Mitral valve prolapse    OSA (obstructive sleep apnea)    on CPAP-Mild - Sleep study 2004   Pre-diabetes    Pulmonary embolus (HCC)    2010 and 12-2013   Pulmonary nodule 12/13/2022   seen on CT chest xray on 12/16/22 - left upper lobe   Sleep apnea    wears CPAP   Thyroid  nodule      dr. watching    Tinnitus    Subjective   Zoster 2014   R flank    Past Surgical History:  Procedure Laterality Date   ABDOMINAL HYSTERECTOMY     still has 1 ovary    APPENDECTOMY     BRAIN SURGERY  1995   Stem surgery, Arnold Chiari Malformation   CATARACT EXTRACTION Bilateral 2017   cataracts, Dr. Lasandra Points   CHOLECYSTECTOMY     COLONOSCOPY     ESOPHAGEAL  DILATION N/A 02/06/2023   Procedure: ESOPHAGEAL DILATION;  Surgeon: Hilarie Lovely, MD;  Location: [facility] OR;  Service: Thoracic;  Laterality: N/A;   ESOPHAGEAL DILATION N/A 02/13/2023   Procedure: ESOPHAGEAL DILATION;  Surgeon: Hilarie Lovely, MD;  Location: [facility] OR;  Service: Thoracic;  Laterality: N/A;   ESOPHAGEAL DILATION N/A 03/12/2023   Procedure: ESOPHAGEAL DILATION;  Surgeon: Hilarie Lovely, MD;  Location: [facility] OR;  Service: Thoracic;  Laterality: N/A;   ESOPHAGEAL MANOMETRY N/A 08/06/2019   Procedure: ESOPHAGEAL MANOMETRY (EM);  Surgeon: Kenney Peacemaker, MD;  Location: [facility] END[facility]COPY;  Service: Endoscopy;  Laterality: N/A;   ESOPHAGOGASTRODUODENOSCOPY N/A 02/06/2023   Procedure: ESOPHAGOGASTRODUODENOSCOPY (EGD);  Surgeon: Hilarie Lovely, MD;  Location: [facility] OR;  Service: Thoracic;  Laterality: N/A;  With Savary dilation   ESOPHAGOGASTRODUODENOSCOPY N/A 02/13/2023   Procedure: ESOPHAGOGASTRODUODENOSCOPY (EGD);  Surgeon: Hilarie Lovely, MD;  Location: [facility] OR;  Service: Thoracic;  Laterality: N/A;   ESOPHAGOGASTRODUODENOSCOPY N/A 03/12/2023   Procedure: ESOPHAGOGASTRODUODENOSCOPY (EGD);  Surgeon: Hilarie Lovely, MD;  Location: [facility] OR;  Service: Thoracic;  Laterality: N/A;  HEMORRHOID BANDING     HERNIA REPAIR     Hital Hernia   JOINT REPLACEMENT Right    knee   KNEE ARTHROSCOPY  05/16/2012   Procedure: ARTHROSCOPY KNEE;  Surgeon: Jonna Netter, MD;  Location: [facility] OR;  Service: Orthopedics;  Laterality: Left;   KNEE ARTHROSCOPY Right 01/2018   NISSEN FUNDOPLICATION     X 3 lab, open x 2 last with mesh   POLYPECTOMY     REDUCTION MAMMAPLASTY Bilateral 2007   TOTAL KNEE ARTHROPLASTY Right 08/25/2019   Procedure: RIGHT TOTAL KNEE ARTHROPLASTY;  Surgeon: Liliane Rei, MD;  Location: [facility] ORS;  Service: Orthopedics;  Laterality: Right;    UPPER GASTROINTESTINAL ENDOSCOPY      Current Outpatient Medications  Medication Instructions   acetaminophen   (TYLENOL ) 500 mg, Oral, Every 6 hours PRN   cycloSPORINE  (RESTASIS ) 0.05 % ophthalmic emulsion 1 drop, 2 times daily   hydrocortisone  valerate cream (WESTCORT ) 0.2 % 1 Application, Daily PRN   hyoscyamine  (LEVSIN) 0.125 mg, Oral, Every 4 hours PRN   Multiple Vitamins-Minerals (MULTIVITAMIN WITH MINERALS) tablet 2 tablets, Daily   NexIUM  40 mg, Oral, Daily before breakfast   olopatadine (PATADAY) 0.1 % ophthalmic solution 1 drop, 2 times daily   Prevagen 10 mg, Daily   rosuvastatin  (CRESTOR ) 10 mg, Oral, Daily at bedtime   temazepam  (RESTORIL ) 30 mg, Oral, At bedtime PRN, for sleep   tirzepatide  (MOUNJARO ) 7.5 mg, Subcutaneous, Weekly   triamterene -hydrochlorothiazide  (MAXZIDE -25) 37.5-25 MG tablet 0.5 tablets, Oral, Daily   Xarelto  5 mg, Oral, Daily       Objective:   Physical Exam BP 122/76   Pulse 84   Temp 98.1 F (36.7 C) (Oral)   Resp 18   Ht 5\' 8"  (1.727 m)   Wt 232 lb 4 oz (105.3 kg)   SpO2 97%   BMI 35.31 kg/m  General:   Well developed, NAD, BMI noted. HEENT:  Normocephalic . Face symmetric, atraumatic Nostrils: On simple inspection there is no obvious lesions, ulcers or polyps. Neurologic:  alert & oriented X3.  Speech normal, gait appropriate for age and unassisted Psych--  Cognition and judgment appear intact.  Cooperative with normal attention span and concentration.  Behavior appropriate. No anxious or depressed appearing.      Assessment   Assessment   DM, + neuropathy  HTN  Anxiety, insomnia: On restoril  prn  Pulmonary: --OSA, sleep study 2004, on a CPAP --PEs:2010 and 12-2013 - saw hematology 12-2014, rec anticoag x 2 years (until 12-2015), then continue with a low-dose xarelto    life -- multiple pulmonary nodule, last CT 11-2013: No further imaging suggested --RAD, inhalers rarely Fibromyalgia CV:  MVP,  carotid US  1-39%s GI: --GERD,chronic dysphagia s/p  fundoplication 3, esophageal stenosis- - April 2021: Barium study, esophageal manometry  (see report) -- IBS --Hemorrhoids: S/p banding ~/2019 -- chronic right flank,RUQ pain : since GB surgery 2008 Etiology not clear but likely multifactorial --> Adhesions from the surgery, neuropathy (DM), postherpetic neuralgia, scarring from PE, etc.  MRI T spine done 06-2014: DJD  Thyroid  nodule: Bx (non neoplastic goiter) 2014,  US  05-2016, 2022 (stable) Zoster --  2014 right flank   PLAN Anxiety insomnia: She has a history of anxiety when driving, previously she tried Prozac , it helped but reportedly it "thinned" her hair so she stopped. Request treatment, will try Lexapro  10 mg nightly.  If not gradually moving in the next few weeks she will let me know.   Insomnia is currently controlled on  Restoril . Nosebleed: Only right-sided, of note is she is on Xarelto  and uses a CPAP.  We talked about ENT referral versus using some Vaseline at the nostril and that is what she elected.  If nosebleed continues she will call for a referral Need to see a new eye doctor, referral sent. RTC as scheduled 11/2023

## 2023-08-17 NOTE — Patient Instructions (Addendum)
  For anxiety, start Lexapro  10 mg at bedtime Call if not gradually better in the next few weeks  For nosebleeds: You may like to use some Vaseline in your nose at bedtime. If the right sided nosebleed continue, let me know, I will refer you to a ENT doctor  See you on 12/07/2023   Per our records you are due for your diabetic eye exam. Please contact your eye doctor to schedule an appointment. Please have them send copies of your office visit notes to us . Our fax number is [phone]. If you need a referral to an eye doctor please let us  know.

## 2023-08-19 NOTE — Assessment & Plan Note (Signed)
 Anxiety insomnia: She has a history of anxiety when driving, previously she tried Prozac , it helped but reportedly it "thinned" her hair so she stopped. Request treatment, will try Lexapro  10 mg nightly.  If not gradually moving in the next few weeks she will let me know.   Insomnia is currently controlled on Restoril . Nosebleed: Only right-sided, of note is she is on Xarelto  and uses a CPAP.  We talked about ENT referral versus using some Vaseline at the nostril and that is what she elected.  If nosebleed continues she will call for a referral Need to see a new eye doctor, referral sent. RTC as scheduled 11/2023

## 2023-08-23 ENCOUNTER — Ambulatory Visit (HOSPITAL_BASED_OUTPATIENT_CLINIC_OR_DEPARTMENT_OTHER): Admitting: Physical Therapy

## 2023-08-29 ENCOUNTER — Encounter (HOSPITAL_COMMUNITY): Payer: Self-pay

## 2023-08-29 ENCOUNTER — Ambulatory Visit (HOSPITAL_BASED_OUTPATIENT_CLINIC_OR_DEPARTMENT_OTHER): Attending: Orthopedic Surgery | Admitting: Physical Therapy

## 2023-08-29 ENCOUNTER — Encounter (HOSPITAL_BASED_OUTPATIENT_CLINIC_OR_DEPARTMENT_OTHER): Payer: Self-pay | Admitting: Physical Therapy

## 2023-08-29 DIAGNOSIS — M5459 Other low back pain: Secondary | ICD-10-CM | POA: Insufficient documentation

## 2023-08-29 DIAGNOSIS — R2689 Other abnormalities of gait and mobility: Secondary | ICD-10-CM | POA: Diagnosis not present

## 2023-08-29 DIAGNOSIS — M6281 Muscle weakness (generalized): Secondary | ICD-10-CM | POA: Insufficient documentation

## 2023-08-29 NOTE — Therapy (Signed)
 OUTPATIENT PHYSICAL THERAPY THORACOLUMBAR TREATMENT   Patient Name: Jennifer [name] MRN: [id number] DOB:Nov 03, 1950, 73 y.o., female Today's Date: 08/29/2023  END OF SESSION:  PT End of Session - 08/29/23 1415     Visit Number 13    Number of Visits 22    Date for PT Re-Evaluation 09/14/23    Authorization Type medicare    Progress Note Due on Visit 20    PT Start Time 1416    PT Stop Time 1507    PT Time Calculation (min) 51 min    Activity Tolerance Patient tolerated treatment well    Behavior During Therapy [facility] for tasks assessed/performed              Past Medical History:  Diagnosis Date   Allergy    Anemia    Arthritis    Cataract    bil cataracts removed   Chronic chest pain    Chronic fatigue 02/03/2017   Clotting disorder (HCC)    Pulmonary Embolis 2010 & 2015   Colon polyps    inflamed partially ulcerated hyperplastic polyp   Diabetes (HCC) 09/24/2013   no meds   Diverticulosis    Dysphagia esophageal phase - chronic after 3 fundoplications 01/20/2014   Edema of both lower extremities    Esophageal stenosis    Stricture after fundoplication REDO   Fibromyalgia    GERD (gastroesophageal reflux disease)    Glaucoma suspect    Heart murmur    no problems    Hemorrhoids, internal, with bleeding 06/27/2017   History of hiatal hernia    Hypertension    IBS (irritable bowel syndrome)    Lactose intolerance    Mitral valve prolapse    OSA (obstructive sleep apnea)    on CPAP-Mild - Sleep study 2004   Pre-diabetes    Pulmonary embolus (HCC)    2010 and 12-2013   Pulmonary nodule 12/13/2022   seen on CT chest xray on 12/16/22 - left upper lobe   Sleep apnea    wears CPAP   Thyroid  nodule      dr. watching    Tinnitus    Subjective   Zoster 2014   R flank   Past Surgical History:  Procedure Laterality Date   ABDOMINAL HYSTERECTOMY     still has 1 ovary    APPENDECTOMY     BRAIN SURGERY  1995   Stem surgery, Arnold Chiari Malformation    CATARACT EXTRACTION Bilateral 2017   cataracts, Dr. Lasandra Points   CHOLECYSTECTOMY     COLONOSCOPY     ESOPHAGEAL DILATION N/A 02/06/2023   Procedure: ESOPHAGEAL DILATION;  Surgeon: Hilarie Lovely, MD;  Location: [facility] OR;  Service: Thoracic;  Laterality: N/A;   ESOPHAGEAL DILATION N/A 02/13/2023   Procedure: ESOPHAGEAL DILATION;  Surgeon: Hilarie Lovely, MD;  Location: [facility] OR;  Service: Thoracic;  Laterality: N/A;   ESOPHAGEAL DILATION N/A 03/12/2023   Procedure: ESOPHAGEAL DILATION;  Surgeon: Hilarie Lovely, MD;  Location: [facility] OR;  Service: Thoracic;  Laterality: N/A;   ESOPHAGEAL MANOMETRY N/A 08/06/2019   Procedure: ESOPHAGEAL MANOMETRY (EM);  Surgeon: Kenney Peacemaker, MD;  Location: [facility] ENDOSCOPY;  Service: Endoscopy;  Laterality: N/A;   ESOPHAGOGASTRODUODENOSCOPY N/A 02/06/2023   Procedure: ESOPHAGOGASTRODUODENOSCOPY (EGD);  Surgeon: Hilarie Lovely, MD;  Location: [facility] OR;  Service: Thoracic;  Laterality: N/A;  With Savary dilation   ESOPHAGOGASTRODUODENOSCOPY N/A 02/13/2023   Procedure: ESOPHAGOGASTRODUODENOSCOPY (EGD);  Surgeon: Hilarie Lovely, MD;  Location: [facility] OR;  Service:  Thoracic;  Laterality: N/A;   ESOPHAGOGASTRODUODENOSCOPY N/A 03/12/2023   Procedure: ESOPHAGOGASTRODUODENOSCOPY (EGD);  Surgeon: Hilarie Lovely, MD;  Location: [facility] OR;  Service: Thoracic;  Laterality: N/A;   HEMORRHOID BANDING     HERNIA REPAIR     Hital Hernia   JOINT REPLACEMENT Right    knee   KNEE ARTHROSCOPY  05/16/2012   Procedure: ARTHROSCOPY KNEE;  Surgeon: Jonna Netter, MD;  Location: [facility] OR;  Service: Orthopedics;  Laterality: Left;   KNEE ARTHROSCOPY Right 01/2018   NISSEN FUNDOPLICATION     X 3 lab, open x 2 last with mesh   POLYPECTOMY     REDUCTION MAMMAPLASTY Bilateral 2007   TOTAL KNEE ARTHROPLASTY Right 08/25/2019   Procedure: RIGHT TOTAL KNEE ARTHROPLASTY;  Surgeon: Liliane Rei, MD;  Location: [facility] ORS;  Service: Orthopedics;  Laterality: Right;    UPPER  GASTROINTESTINAL ENDOSCOPY     Patient Active Problem List   Diagnosis Date Noted   Vitamin D  deficiency 12/12/2022   Decreased GFR 12/12/2022   Physically inactive 12/12/2022   Calculus of kidney 07/14/2021   OSA on CPAP 06/08/2021   Circadian rhythm sleep disorder, delayed sleep phase type 06/08/2021   Glaucoma suspect 06/25/2020   S/P total knee arthroplasty, right 08/27/2019   OA (osteoarthritis) of knee 08/25/2019   Primary osteoarthritis of right knee 08/25/2019   Atypical chest pain    Hemorrhoids, internal, with bleeding 06/27/2017   IDA (iron deficiency anemia) 02/20/2017   Chronic fatigue 02/03/2017   Left leg pain 11/29/2016   History of colonic polyps 12/02/2015   PCP NOTES >>>>>>>>>>>>>>>>>>>>>>>>> 02/13/2015   Anxiety 08/04/2014   Annual physical exam 04/29/2014   Fibromyalgia syndrome 02/04/2014   Dysphagia 01/20/2014   Chronic cough 01/08/2014   DOE (dyspnea on exertion) 12/26/2013   Diabetes (HCC) 09/24/2013   Abdominal pain, chronic, right flank -upper quadrant 07/02/2012   Class 2 severe obesity with serious comorbidity and body mass index (BMI) of 38.0 to 38.9 in adult [facility]) 09/30/2009   Hypersomnia with sleep apnea 09/28/2009   Essential hypertension 06/24/2009   DIZZINESS 06/24/2009   HEADACHE 06/24/2009   VENOUS INSUFFICIENCY, LEGS 11/19/2008   Acute pulmonary embolism (HCC) 05/19/2008   Pulmonary nodule 08/26/2007   Stricture and stenosis of esophagus 05/22/2007   GERD 01/31/2007   IBS (irritable bowel syndrome) 01/31/2007   MITRAL VALVE PROLAPSE, HX OF 01/31/2007    PCP: Devonna Foley MD  REFERRING PROVIDER: Mort Ards, MD   REFERRING DIAG: M54.50 (ICD-10-CM) - Low back pain   Rationale for Evaluation and Treatment: Rehabilitation  THERAPY DIAG:  Other low back pain  Other abnormalities of gait and mobility  Muscle weakness (generalized)  ONSET DATE: Last 2 yrs  SUBJECTIVE:  SUBJECTIVE STATEMENT: "I went on vacation and had a good time.  Been doing my exercises.  Feel good. I am doing so much better"  POOL ACCESS: currently none, but considering membership at [facility] or elsewhere.  Initial subjective Flare of LBP that has stayed for 3 weeks this time.  Thought it was my bed ??.  Using the pain patches every once in a while and they help. I wake up with pain. R TKR ~ 5 yrs ago.   PERTINENT HISTORY:   Lumbar spondylosis  PAIN:  Are you having pain? Yes: NPRS scale: currently 2/10 Pain location: LB Pain description: ache Aggravating factors: sitting >30 minutes, lying on left side or too long on back Relieving factors: heating pad, pain patches  PRECAUTIONS: None  RED FLAGS: None   WEIGHT BEARING RESTRICTIONS: No  FALLS:  Has patient fallen in last 6 months? No   OCCUPATION: retired; Clinical cytogeneticist  PLOF: Independent  PATIENT GOALS: improve strength, get back into exercise.  NEXT MD VISIT: as needed  OBJECTIVE:  Note: Objective measures were completed at Evaluation unless otherwise noted.  DIAGNOSTIC FINDINGS:  Imaging from today does demonstrate mild degenerative disc disease at L4-L5 and L5-S1   PATIENT SURVEYS:  Modified Oswestry 15/45   COGNITION: Overall cognitive status: Within functional limits for tasks assessed     SENSATION: WFL  MUSCLE LENGTH: Hamstrings: Bilateral hamstring    POSTURE: rounded shoulders, forward head, and left knee valgus  PALPATION: Center l4-5; L5-S1  LUMBAR ROM:  wfl  LOWER EXTREMITY ROM:     wfl  LOWER EXTREMITY MMT:    MMT Right eval Left eval R / L 4/8  Hip flexion 40.6 37.1 21.1 / 36.1  Hip extension     Hip abduction 27.4 26.7 25.9 / 20.4  Hip adduction     Hip internal rotation     Hip external rotation     Knee flexion     Knee extension 47.1 29.3  29.4/ 30.2  Ankle dorsiflexion     Ankle plantarflexion     Ankle inversion     Ankle eversion      (Blank rows = not tested)  LUMBAR SPECIAL TESTS:  Slump test: Negative  FUNCTIONAL TESTS:  30 s STS 4 from pool bench with ue assist (norm 10-14 ) Timed up and go (TUG): 14.59 4 stage balance: passed 1&2; Tandem x 10s (unsteady); SLS x 6s     07/31/23   30s STS from pool bench: 5   TUG 13.51   4 stage balance: SLS: 14s GAIT: Distance walked: 466ft Assistive device utilized: None Level of assistance: Complete Independence Comments: Left knee valgus knocking right.  TREATMENT  [facility] Adult PT Treatment:                                                DATE: 08/29/23 Pt seen for aquatic therapy today.  Treatment took place in water 3.5-4.75 ft in depth at the [facility] pool. Temp of water was 91.  Pt entered/exited the pool via stairs with hand rail.   *unsupported: walking forward/ backward/side ways in 4+ ft   *Farmers carry yellow HB bilaterally then unilaterally forward, back *side stepping with rbhb->yellow  shoulder add/abd x 2 widths-> side lunge x 4 widths *hip hinge; Verbal and tactile cues.  Good execution. Pausing in "L" stretch position for  stretch * staggered stances with reciprocal arm swing with medium resistance bells;3 sets 8 slow/8 fast then wide 2 x 6 fast/slow *solid noodle puul down wide stance and staggered stance x 10 * Straddled yellow noodle,: cycling multiple laps   Pt requires the buoyancy and hydrostatic pressure of water for support, and to offload joints by unweighting joint load by at least 50 % in navel deep water and by at least 75-80% in chest to neck deep water.  Viscosity of the water is needed for resistance of strengthening. Water current perturbations provides challenge to standing balance requiring increased core activation.   [facility] Adult PT Treatment:                                                DATE: 08/08/23 Pt seen for aquatic  therapy today.  Treatment took place in water 3.5-4.75 ft in depth at the [facility] pool. Temp of water was 91.  Pt entered/exited the pool via stairs with hand rail.   *unsupported: walking forward/ backward in 4+ ft of water 4 laps,  *side stepping ue RBHB ue add/abd *Farmers carry yellow HB bilaterally then unilaterally forward, back *side stepping with rbhb  shoulder add/abd x 2 widths-> side lunge x 4 widths * wide stance with reciprocal arm swing with medium resistance bells;3 sets 6 slow/6 fast then staggered stances 3 x 5 fast/slow *solid noodle press wide stance then staggered stances x 10. Cues for core engagement * Straddled yellow noodle,: cycling multiple laps   Pt requires the buoyancy and hydrostatic pressure of water for support, and to offload joints by unweighting joint load by at least 50 % in navel deep water and by at least 75-80% in chest to neck deep water.  Viscosity of the water is needed for resistance of strengthening. Water current perturbations provides challenge to standing balance requiring increased core activation.   [facility] Adult PT Treatment:                                                DATE: 08/02/23 Therapeutic Exercise: Nustep L5 x 6 min using UEs/LEs Dead bugs 2x10 DKTC 2x30" SKTC x30" Neuromuscular re-ed: Supine PPT + diaphragmatic breaths 2x10 Supine bridging slow one lumbar segment up at a time and down 2x10 Feet on pball knee flex/ext with PPT x10 Sidelying hip abd to fatigue Standing PPT x10 against wall                       PATIENT EDUCATION:  Education details: rationale for interventions Person educated: Patient Education method: Explanation, Demonstration, Tactile cues, Verbal cues Education comprehension: verbalized understanding, returned demonstration, verbal cues required, tactile cues required, and needs further education     HOME EXERCISE PROGRAM: Access Code: [email or web address] URL:  [email or web address] Date: 08/02/2023 Prepared by: Gellen April Erman Hayward  Exercises - Heel Raises with Counter Support  - 2-3 x daily - 1 sets - 8-10 reps - Supine Bridge  - 1 x daily - 7 x weekly - 2 sets - 10 reps - Sidelying Hip Abduction  - 1 x daily - 7 x weekly - 2 sets - 10 reps - Dead Bug  - 1 x daily -  7 x weekly - 2 sets - 10 reps - Standing with Back Flat Against Wall  - 1 x daily - 7 x weekly - 2 sets - 10 reps  ASSESSMENT:  CLINICAL IMPRESSION: 5/7/2025Improved overall pain sensitivity. Pt reports improved function at home. Progressed core strengthening and LB stretching with good toleration. Cues throughout session for core engagement.  LBP 0/10 upon completion.  Discussed pool access and progression of therapy.  She will gain membership here at [facility] upon DC. Will likely reach max potential in pool by end of cert.     PN: pt reports her pain is down ~ 40% since start of therapy.  She has transitioned onto land based intervention with fair toleration so far. She has met all of her STG and is progressing towards her LTG.  Progress is slow due to pain. She is flared today which has skewed her strength testing demonstrating a decrease in RLE in hip flex and knee ext.  She has slightly improved her 30 S STS and TUG indicating an improvement in transitional movements and balance.  She will continue to benefit from skilled PT intervention. Discussions going forward with potential for pool access as she VU of chronic condition that will need to be managed.  Will see pt predominantly in aquatics but intermix land based for further progression of strength and toleration to activity and towards meeting all land based goals.   OBJECTIVE IMPAIRMENTS: Abnormal gait, decreased activity tolerance, decreased balance, impaired flexibility, and pain.   ACTIVITY LIMITATIONS: sleeping, stairs, transfers, and locomotion level  PARTICIPATION LIMITATIONS: shopping and community  activity  PERSONAL FACTORS: Fitness are also affecting patient's functional outcome.   REHAB POTENTIAL: Good  CLINICAL DECISION MAKING: Evolving/moderate complexity  EVALUATION COMPLEXITY: Moderate   GOALS: Goals reviewed with patient? Yes  SHORT TERM GOALS: Target date: 07/07/23  Pt will tolerate full aquatic sessions consistently without increase in pain and with improving function to demonstrate good toleration and effectiveness of intervention.  Baseline: 07/10/23: reports improving tolerance w/ aquatics Goal status: MET  2. Pt will report walking x 15 minutes 5/7 days a week to demonstrate committment to increasing her physical activity Baseline: walking 30 min (on walking pad)  Goal status: MET - 07/17/23  3.  Pt will report a reduction of LBP by at least 25% Baseline: see chart 07/10/23: continues to endorse fluctuations 07/25/23: 40% Goal status: Met 07/25/23   LONG TERM GOALS: Target date: 09/14/23  Pt to improve on ODI by  20 % to demonstrate statistically significant Improvement in function. Baseline: 33% Goal status: INITIAL  2.  Pt will be indep with final HEP's (land and aquatic as appropriate) for continued management of condition Baseline:  Goal status: INITIAL  3.  Pt will report decrease in pain by at least 75% for improved toleration to activity/quality of life and to demonstrate improved management of pain.  Baseline: see chart; ~40% 07/31/23 Goal status: In progress  4.  Pt will improve strength in LE up towards 10lbs to demonstrate improved overall physical function Baseline: see chart Goal status: In progress 07/31/23  5.  Pt will improve on 30s STS test to <or= 8 to demonstrate improving functional lower extremity strength, transitional movements, and balance Baseline: 4; 5 07/31/23 Goal status: In progress  6.  Pt will improve on Tug test to <or=13s (norm) to demonstrate improvement in lower extremity function, mobility and decreased fall  risk. Baseline: 14.59; 13.51 (07/31/23) Goal status: In progress  PLAN:  PT FREQUENCY:  2x/week  PT DURATION: 6 weeks  PLANNED INTERVENTIONS: 97164- PT Re-evaluation, 97110-Therapeutic exercises, 97530- Therapeutic activity, 97112- Neuromuscular re-education, 97535- Self Care, 16109- Manual therapy, [phone]- Gait training, [phone]- Orthotic Fit/training, [phone]- Aquatic Therapy, [phone]- Ionotophoresis 4mg /ml Dexamethasone , Patient/Family education, Balance training, Stair training, Taping, Dry Needling, Joint mobilization, Cryotherapy, and Moist heat.  PLAN FOR NEXT SESSION: aquatic sessions for pain management and to initiate muscle lengthening/strengthening and balance then incorporate land based intervention for added load bearing strengthening and final HEP for continued management of chronic condition   Frankie Jannae Fagerstrom, PT, 08/29/23 3:10 PM [facility] [address] [place], [place], [address] Phone: [phone]   Fax:  [phone]

## 2023-08-31 ENCOUNTER — Encounter (HOSPITAL_BASED_OUTPATIENT_CLINIC_OR_DEPARTMENT_OTHER): Payer: Self-pay | Admitting: Physical Therapy

## 2023-08-31 ENCOUNTER — Ambulatory Visit (HOSPITAL_BASED_OUTPATIENT_CLINIC_OR_DEPARTMENT_OTHER): Admitting: Physical Therapy

## 2023-08-31 DIAGNOSIS — M6281 Muscle weakness (generalized): Secondary | ICD-10-CM | POA: Diagnosis not present

## 2023-08-31 DIAGNOSIS — M5459 Other low back pain: Secondary | ICD-10-CM | POA: Diagnosis not present

## 2023-08-31 DIAGNOSIS — R2689 Other abnormalities of gait and mobility: Secondary | ICD-10-CM | POA: Diagnosis not present

## 2023-08-31 NOTE — Therapy (Signed)
 OUTPATIENT PHYSICAL THERAPY THORACOLUMBAR TREATMENT   Patient Name: Jennifer [name] MRN: [id number] DOB:15-Aug-1950, 73 y.o., female Today's Date: 08/31/2023  END OF SESSION:  PT End of Session - 08/31/23 1514     Visit Number 14    Number of Visits 22    Date for PT Re-Evaluation 09/14/23    Authorization Type medicare    Progress Note Due on Visit 20    PT Start Time 1514    PT Stop Time 1553    PT Time Calculation (min) 39 min    Activity Tolerance Patient tolerated treatment well    Behavior During Therapy [facility] for tasks assessed/performed              Past Medical History:  Diagnosis Date   Allergy    Anemia    Arthritis    Cataract    bil cataracts removed   Chronic chest pain    Chronic fatigue 02/03/2017   Clotting disorder (HCC)    Pulmonary Embolis 2010 & 2015   Colon polyps    inflamed partially ulcerated hyperplastic polyp   Diabetes (HCC) 09/24/2013   no meds   Diverticulosis    Dysphagia esophageal phase - chronic after 3 fundoplications 01/20/2014   Edema of both lower extremities    Esophageal stenosis    Stricture after fundoplication REDO   Fibromyalgia    GERD (gastroesophageal reflux disease)    Glaucoma suspect    Heart murmur    no problems    Hemorrhoids, internal, with bleeding 06/27/2017   History of hiatal hernia    Hypertension    IBS (irritable bowel syndrome)    Lactose intolerance    Mitral valve prolapse    OSA (obstructive sleep apnea)    on CPAP-Mild - Sleep study 2004   Pre-diabetes    Pulmonary embolus (HCC)    2010 and 12-2013   Pulmonary nodule 12/13/2022   seen on CT chest xray on 12/16/22 - left upper lobe   Sleep apnea    wears CPAP   Thyroid  nodule      dr. watching    Tinnitus    Subjective   Zoster 2014   R flank   Past Surgical History:  Procedure Laterality Date   ABDOMINAL HYSTERECTOMY     still has 1 ovary    APPENDECTOMY     BRAIN SURGERY  1995   Stem surgery, Arnold Chiari Malformation    CATARACT EXTRACTION Bilateral 2017   cataracts, Dr. Lasandra Points   CHOLECYSTECTOMY     COLONOSCOPY     ESOPHAGEAL DILATION N/A 02/06/2023   Procedure: ESOPHAGEAL DILATION;  Surgeon: Hilarie Lovely, MD;  Location: [facility] OR;  Service: Thoracic;  Laterality: N/A;   ESOPHAGEAL DILATION N/A 02/13/2023   Procedure: ESOPHAGEAL DILATION;  Surgeon: Hilarie Lovely, MD;  Location: [facility] OR;  Service: Thoracic;  Laterality: N/A;   ESOPHAGEAL DILATION N/A 03/12/2023   Procedure: ESOPHAGEAL DILATION;  Surgeon: Hilarie Lovely, MD;  Location: [facility] OR;  Service: Thoracic;  Laterality: N/A;   ESOPHAGEAL MANOMETRY N/A 08/06/2019   Procedure: ESOPHAGEAL MANOMETRY (EM);  Surgeon: Kenney Peacemaker, MD;  Location: [facility] ENDOSCOPY;  Service: Endoscopy;  Laterality: N/A;   ESOPHAGOGASTRODUODENOSCOPY N/A 02/06/2023   Procedure: ESOPHAGOGASTRODUODENOSCOPY (EGD);  Surgeon: Hilarie Lovely, MD;  Location: [facility] OR;  Service: Thoracic;  Laterality: N/A;  With Savary dilation   ESOPHAGOGASTRODUODENOSCOPY N/A 02/13/2023   Procedure: ESOPHAGOGASTRODUODENOSCOPY (EGD);  Surgeon: Hilarie Lovely, MD;  [facility];  Service:  Thoracic;  Laterality: N/A;   ESOPHAGOGASTRODUODENOSCOPY N/A 03/12/2023   Procedure: ESOPHAGOGASTRODUODENOSCOPY (EGD);  Surgeon: Hilarie Lovely, MD;  Location: [facility] OR;  Service: Thoracic;  Laterality: N/A;   HEMORRHOID BANDING     HERNIA REPAIR     Hital Hernia   JOINT REPLACEMENT Right    knee   KNEE ARTHROSCOPY  05/16/2012   Procedure: ARTHROSCOPY KNEE;  Surgeon: Jonna Netter, MD;  Location: [facility] OR;  Service: Orthopedics;  Laterality: Left;   KNEE ARTHROSCOPY Right 01/2018   NISSEN FUNDOPLICATION     X 3 lab, open x 2 last with mesh   POLYPECTOMY     REDUCTION MAMMAPLASTY Bilateral 2007   TOTAL KNEE ARTHROPLASTY Right 08/25/2019   Procedure: RIGHT TOTAL KNEE ARTHROPLASTY;  Surgeon: Liliane Rei, MD;  Location: [facility] ORS;  Service: Orthopedics;  Laterality: Right;    UPPER  GASTROINTESTINAL ENDOSCOPY     Patient Active Problem List   Diagnosis Date Noted   Vitamin D  deficiency 12/12/2022   Decreased GFR 12/12/2022   Physically inactive 12/12/2022   Calculus of kidney 07/14/2021   OSA on CPAP 06/08/2021   Circadian rhythm sleep disorder, delayed sleep phase type 06/08/2021   Glaucoma suspect 06/25/2020   S/P total knee arthroplasty, right 08/27/2019   OA (osteoarthritis) of knee 08/25/2019   Primary osteoarthritis of right knee 08/25/2019   Atypical chest pain    Hemorrhoids, internal, with bleeding 06/27/2017   IDA (iron deficiency anemia) 02/20/2017   Chronic fatigue 02/03/2017   Left leg pain 11/29/2016   History of colonic polyps 12/02/2015   PCP NOTES >>>>>>>>>>>>>>>>>>>>>>>>> 02/13/2015   Anxiety 08/04/2014   Annual physical exam 04/29/2014   Fibromyalgia syndrome 02/04/2014   Dysphagia 01/20/2014   Chronic cough 01/08/2014   DOE (dyspnea on exertion) 12/26/2013   Diabetes (HCC) 09/24/2013   Abdominal pain, chronic, right flank -upper quadrant 07/02/2012   Class 2 severe obesity with serious comorbidity and body mass index (BMI) of 38.0 to 38.9 in adult [facility]) 09/30/2009   Hypersomnia with sleep apnea 09/28/2009   Essential hypertension 06/24/2009   DIZZINESS 06/24/2009   HEADACHE 06/24/2009   VENOUS INSUFFICIENCY, LEGS 11/19/2008   Acute pulmonary embolism (HCC) 05/19/2008   Pulmonary nodule 08/26/2007   Stricture and stenosis of esophagus 05/22/2007   GERD 01/31/2007   IBS (irritable bowel syndrome) 01/31/2007   MITRAL VALVE PROLAPSE, HX OF 01/31/2007    PCP: Devonna Foley MD  REFERRING PROVIDER: Mort Ards, MD   REFERRING DIAG: M54.50 (ICD-10-CM) - Low back pain   Rationale for Evaluation and Treatment: Rehabilitation  THERAPY DIAG:  Other low back pain  Other abnormalities of gait and mobility  Muscle weakness (generalized)  ONSET DATE: Last 2 yrs  SUBJECTIVE:  SUBJECTIVE STATEMENT: "I'm feeling better. " Pt reports she has been doing some of the exercise from HEP, but some of them are painful.   POOL ACCESS: currently none, but  plans to get membership at [facility] at d/c.  Initial subjective Flare of LBP that has stayed for 3 weeks this time.  Thought it was my bed ??.  Using the pain patches every once in a while and they help. I wake up with pain. R TKR ~ 5 yrs ago.   PERTINENT HISTORY:   Lumbar spondylosis  PAIN:  Are you having pain? Yes: NPRS scale: currently 3/10 Pain location: LB Pain description: ache Aggravating factors: sitting >30 minutes, lying on left side or too long on back Relieving factors: heating pad, pain patches  PRECAUTIONS: None  RED FLAGS: None   WEIGHT BEARING RESTRICTIONS: No  FALLS:  Has patient fallen in last 6 months? No   OCCUPATION: retired; Clinical cytogeneticist  PLOF: Independent  PATIENT GOALS: improve strength, get back into exercise.  NEXT MD VISIT: as needed  OBJECTIVE:  Note: Objective measures were completed at Evaluation unless otherwise noted.  DIAGNOSTIC FINDINGS:  Imaging from today does demonstrate mild degenerative disc disease at L4-L5 and L5-S1   PATIENT SURVEYS:  Modified Oswestry 15/45   COGNITION: Overall cognitive status: Within functional limits for tasks assessed     SENSATION: WFL  MUSCLE LENGTH: Hamstrings: Bilateral hamstring    POSTURE: rounded shoulders, forward head, and left knee valgus  PALPATION: Center l4-5; L5-S1  LUMBAR ROM:  wfl  LOWER EXTREMITY ROM:     wfl  LOWER EXTREMITY MMT:    MMT Right eval Left eval R / L 4/8  Hip flexion 40.6 37.1 21.1 / 36.1  Hip extension     Hip abduction 27.4 26.7 25.9 / 20.4  Hip adduction     Hip internal rotation     Hip external rotation     Knee flexion     Knee extension 47.1  29.3 29.4/ 30.2  Ankle dorsiflexion     Ankle plantarflexion     Ankle inversion     Ankle eversion      (Blank rows = not tested)  LUMBAR SPECIAL TESTS:  Slump test: Negative  FUNCTIONAL TESTS:  30 s STS 4 from pool bench with ue assist (norm 10-14 ) Timed up and go (TUG): 14.59 4 stage balance: passed 1&2; Tandem x 10s (unsteady); SLS x 6s     07/31/23   30s STS from pool bench: 5   TUG 13.51   4 stage balance: SLS: 14s GAIT: Distance walked: 449ft Assistive device utilized: None Level of assistance: Complete Independence Comments: Left knee valgus knocking right.  TREATMENT  [facility] Adult PT Treatment:                                                DATE: 08/31/23 Pt seen for aquatic therapy today.  Treatment took place in water 3.5-4.75 ft in depth at the [facility] pool. Temp of water was 91.  Pt entered/exited the pool via stairs with hand rail.   *unsupported: walking forward/ backward in 4+ ft   *Farmers carry yellow HB bilaterally then unilaterally, marching forward, back *side stepping with rainbow hand float->yellow hand floats with shoulder add/abd -> sidetep into wide squat * Straddled yellow noodle,: cycling  for relief of low back  pain *solid noodle pull down to thighs in wide stance x 5 -> hip hinge for stretch -> hollow long noodle pull down in staggered stance  x 5 each -> back to wide stance with hollow noodle * return to walking forward/ backward  * return to cycling while suspended on noodle   [facility] Adult PT Treatment:                                                DATE: 08/29/23 Pt seen for aquatic therapy today.  Treatment took place in water 3.5-4.75 ft in depth at the [facility] pool. Temp of water was 91.  Pt entered/exited the pool via stairs with hand rail.   *unsupported: walking forward/ backward/side ways in 4+ ft   *Farmers carry yellow HB bilaterally then unilaterally forward, back *side stepping with rbhb->yellow  shoulder  add/abd x 2 widths-> side lunge x 4 widths *hip hinge; Verbal and tactile cues.  Good execution. Pausing in "L" stretch position for stretch * staggered stances with reciprocal arm swing with medium resistance bells;3 sets 8 slow/8 fast then wide 2 x 6 fast/slow *solid noodle puul down wide stance and staggered stance x 10 * Straddled yellow noodle,: cycling multiple laps   Pt requires the buoyancy and hydrostatic pressure of water for support, and to offload joints by unweighting joint load by at least 50 % in navel deep water and by at least 75-80% in chest to neck deep water.  Viscosity of the water is needed for resistance of strengthening. Water current perturbations provides challenge to standing balance requiring increased core activation.   [facility] Adult PT Treatment:                                                DATE: 08/08/23 Pt seen for aquatic therapy today.  Treatment took place in water 3.5-4.75 ft in depth at the [facility] pool. Temp of water was 91.  Pt entered/exited the pool via stairs with hand rail.   *unsupported: walking forward/ backward in 4+ ft of water 4 laps,  *side stepping ue RBHB ue add/abd *Farmers carry yellow HB bilaterally then unilaterally forward, back *side stepping with rbhb  shoulder add/abd x 2 widths-> side lunge x 4 widths * wide stance with reciprocal arm swing with medium resistance bells;3 sets 6 slow/6 fast then staggered stances 3 x 5 fast/slow *solid noodle press wide stance then staggered stances x 10. Cues for core engagement * Straddled yellow noodle,: cycling multiple laps   Pt requires the buoyancy and hydrostatic pressure of water for support, and to offload joints by unweighting joint load by at least 50 % in navel deep water and by at least 75-80% in chest to neck deep water.  Viscosity of the water is needed for resistance of strengthening. Water current perturbations provides challenge to standing balance requiring increased  core activation.   [facility] Adult PT Treatment:                                                DATE: 08/02/23 Therapeutic Exercise:  Nustep L5 x 6 min using UEs/LEs Dead bugs 2x10 DKTC 2x30" SKTC x30" Neuromuscular re-ed: Supine PPT + diaphragmatic breaths 2x10 Supine bridging slow one lumbar segment up at a time and down 2x10 Feet on pball knee flex/ext with PPT x10 Sidelying hip abd to fatigue Standing PPT x10 against wall                       PATIENT EDUCATION:  Education details: rationale for interventions Person educated: Patient Education method: Explanation, Demonstration, Tactile cues, Verbal cues Education comprehension: verbalized understanding, returned demonstration, verbal cues required, tactile cues required, and needs further education     HOME EXERCISE PROGRAM: Access Code: [email or web address] URL: [email or web address] Date: 08/02/2023 Prepared by: Gellen April Erman Hayward  Exercises - Heel Raises with Counter Support  - 2-3 x daily - 1 sets - 8-10 reps - Supine Bridge  - 1 x daily - 7 x weekly - 2 sets - 10 reps - Sidelying Hip Abduction  - 1 x daily - 7 x weekly - 2 sets - 10 reps - Dead Bug  - 1 x daily - 7 x weekly - 2 sets - 10 reps - Standing with Back Flat Against Wall  - 1 x daily - 7 x weekly - 2 sets - 10 reps  ASSESSMENT:  CLINICAL IMPRESSION: 08/31/2023 Continued positive response to aquatic therapy sessions. Today she reported some mild increase in low back pain with wide squat with side stepping and arm addct with yellow floats(up to 6/10). Pain slightly relieved with suspended cycling.  She plans to gain membership here at [facility] upon DC. Pt will likely reach max potential in pool by end of cert. Plan to issued laminated aquatic HEP in next 2 visits.   Therapist to check 30s STS and/or TUG next session as time allows.      OBJECTIVE IMPAIRMENTS: Abnormal gait, decreased activity tolerance, decreased balance, impaired flexibility, and  pain.   ACTIVITY LIMITATIONS: sleeping, stairs, transfers, and locomotion level  PARTICIPATION LIMITATIONS: shopping and community activity  PERSONAL FACTORS: Fitness are also affecting patient's functional outcome.   REHAB POTENTIAL: Good  CLINICAL DECISION MAKING: Evolving/moderate complexity  EVALUATION COMPLEXITY: Moderate   GOALS: Goals reviewed with patient? Yes  SHORT TERM GOALS: Target date: 07/07/23  Pt will tolerate full aquatic sessions consistently without increase in pain and with improving function to demonstrate good toleration and effectiveness of intervention.  Baseline: 07/10/23: reports improving tolerance w/ aquatics Goal status: MET  2. Pt will report walking x 15 minutes 5/7 days a week to demonstrate committment to increasing her physical activity Baseline: walking 30 min (on walking pad)  Goal status: MET - 07/17/23  3.  Pt will report a reduction of LBP by at least 25% Baseline: see chart 07/10/23: continues to endorse fluctuations 07/25/23: 40% Goal status: Met 07/25/23   LONG TERM GOALS: Target date: 09/14/23  Pt to improve on ODI by  20 % to demonstrate statistically significant Improvement in function. Baseline: 33% Goal status: INITIAL  2.  Pt will be indep with final HEP's (land and aquatic as appropriate) for continued management of condition Baseline:  Goal status: INITIAL  3.  Pt will report decrease in pain by at least 75% for improved toleration to activity/quality of life and to demonstrate improved management of pain.  Baseline: see chart; ~40% 07/31/23 Goal status: In progress  4.  Pt will improve strength in LE up towards 10lbs to demonstrate improved overall physical function Baseline:  see chart Goal status: In progress 07/31/23  5.  Pt will improve on 30s STS test to <or= 8 to demonstrate improving functional lower extremity strength, transitional movements, and balance Baseline: 4; 5 07/31/23 Goal status: In progress  6.  Pt will  improve on Tug test to <or=13s (norm) to demonstrate improvement in lower extremity function, mobility and decreased fall risk. Baseline: 14.59; 13.51 (07/31/23) Goal status: In progress  PLAN:  PT FREQUENCY: 2x/week  PT DURATION: 6 weeks  PLANNED INTERVENTIONS: 97164- PT Re-evaluation, 97110-Therapeutic exercises, 97530- Therapeutic activity, 97112- Neuromuscular re-education, 97535- Self Care, 16109- Manual therapy, [phone]- Gait training, [phone]- Orthotic Fit/training, [phone]- Aquatic Therapy, [phone]- Ionotophoresis 4mg /ml Dexamethasone , Patient/Family education, Balance training, Stair training, Taping, Dry Needling, Joint mobilization, Cryotherapy, and Moist heat.  PLAN FOR NEXT SESSION: aquatic sessions for pain management and to initiate muscle lengthening/strengthening and balance then incorporate land based intervention for added load bearing strengthening and final HEP for continued management of chronic condition   Almedia Jacobsen, PTA 08/31/23 4:08 PM [facility] [address] [place], [place], [address] Phone: [phone]   Fax:  [phone]

## 2023-09-03 NOTE — Therapy (Signed)
 OUTPATIENT PHYSICAL THERAPY THORACOLUMBAR TREATMENT   Patient Name: Jennifer [name] MRN: [id number] DOB:23-Aug-1950, 73 y.o., female Today's Date: 09/05/2023  END OF SESSION:  PT End of Session - 09/04/23 1541     Visit Number 15    Number of Visits 22    Date for PT Re-Evaluation 09/14/23    Authorization Type medicare    PT Start Time 1539    PT Stop Time 1621    PT Time Calculation (min) 42 min    Activity Tolerance Patient tolerated treatment well    Behavior During Therapy [facility] for tasks assessed/performed               Past Medical History:  Diagnosis Date   Allergy    Anemia    Arthritis    Cataract    bil cataracts removed   Chronic chest pain    Chronic fatigue 02/03/2017   Clotting disorder (HCC)    Pulmonary Embolis 2010 & 2015   Colon polyps    inflamed partially ulcerated hyperplastic polyp   Diabetes (HCC) 09/24/2013   no meds   Diverticulosis    Dysphagia esophageal phase - chronic after 3 fundoplications 01/20/2014   Edema of both lower extremities    Esophageal stenosis    Stricture after fundoplication REDO   Fibromyalgia    GERD (gastroesophageal reflux disease)    Glaucoma suspect    Heart murmur    no problems    Hemorrhoids, internal, with bleeding 06/27/2017   History of hiatal hernia    Hypertension    IBS (irritable bowel syndrome)    Lactose intolerance    Mitral valve prolapse    OSA (obstructive sleep apnea)    on CPAP-Mild - Sleep study 2004   Pre-diabetes    Pulmonary embolus (HCC)    2010 and 12-2013   Pulmonary nodule 12/13/2022   seen on CT chest xray on 12/16/22 - left upper lobe   Sleep apnea    wears CPAP   Thyroid  nodule      dr. watching    Tinnitus    Subjective   Zoster 2014   R flank   Past Surgical History:  Procedure Laterality Date   ABDOMINAL HYSTERECTOMY     still has 1 ovary    APPENDECTOMY     BRAIN SURGERY  1995   Stem surgery, Arnold Chiari Malformation   CATARACT EXTRACTION Bilateral 2017    cataracts, Dr. Lasandra Points   CHOLECYSTECTOMY     COLONOSCOPY     ESOPHAGEAL DILATION N/A 02/06/2023   Procedure: ESOPHAGEAL DILATION;  Surgeon: Hilarie Lovely, MD;  Location: [facility] OR;  Service: Thoracic;  Laterality: N/A;   ESOPHAGEAL DILATION N/A 02/13/2023   Procedure: ESOPHAGEAL DILATION;  Surgeon: Hilarie Lovely, MD;  Location: [facility] OR;  Service: Thoracic;  Laterality: N/A;   ESOPHAGEAL DILATION N/A 03/12/2023   Procedure: ESOPHAGEAL DILATION;  Surgeon: Hilarie Lovely, MD;  Location: [facility] OR;  Service: Thoracic;  Laterality: N/A;   ESOPHAGEAL MANOMETRY N/A 08/06/2019   Procedure: ESOPHAGEAL MANOMETRY (EM);  Surgeon: Kenney Peacemaker, MD;  Location: [facility] ENDOSCOPY;  Service: Endoscopy;  Laterality: N/A;   ESOPHAGOGASTRODUODENOSCOPY N/A 02/06/2023   Procedure: ESOPHAGOGASTRODUODENOSCOPY (EGD);  Surgeon: Hilarie Lovely, MD;  Location: [facility] OR;  Service: Thoracic;  Laterality: N/A;  With Savary dilation   ESOPHAGOGASTRODUODENOSCOPY N/A 02/13/2023   Procedure: ESOPHAGOGASTRODUODENOSCOPY (EGD);  Surgeon: Hilarie Lovely, MD;  Location: [facility] OR;  Service: Thoracic;  Laterality: N/A;   ESOPHAGOGASTRODUODENOSCOPY N/A  03/12/2023   Procedure: ESOPHAGOGASTRODUODENOSCOPY (EGD);  Surgeon: Hilarie Lovely, MD;  Location: [facility] OR;  Service: Thoracic;  Laterality: N/A;   HEMORRHOID BANDING     HERNIA REPAIR     Hital Hernia   JOINT REPLACEMENT Right    knee   KNEE ARTHROSCOPY  05/16/2012   Procedure: ARTHROSCOPY KNEE;  Surgeon: Jonna Netter, MD;  Location: [facility] OR;  Service: Orthopedics;  Laterality: Left;   KNEE ARTHROSCOPY Right 01/2018   NISSEN FUNDOPLICATION     X 3 lab, open x 2 last with mesh   POLYPECTOMY     REDUCTION MAMMAPLASTY Bilateral 2007   TOTAL KNEE ARTHROPLASTY Right 08/25/2019   Procedure: RIGHT TOTAL KNEE ARTHROPLASTY;  Surgeon: Liliane Rei, MD;  Location: [facility] ORS;  Service: Orthopedics;  Laterality: Right;    UPPER GASTROINTESTINAL ENDOSCOPY     Patient  Active Problem List   Diagnosis Date Noted   Vitamin D  deficiency 12/12/2022   Decreased GFR 12/12/2022   Physically inactive 12/12/2022   Calculus of kidney 07/14/2021   OSA on CPAP 06/08/2021   Circadian rhythm sleep disorder, delayed sleep phase type 06/08/2021   Glaucoma suspect 06/25/2020   S/P total knee arthroplasty, right 08/27/2019   OA (osteoarthritis) of knee 08/25/2019   Primary osteoarthritis of right knee 08/25/2019   Atypical chest pain    Hemorrhoids, internal, with bleeding 06/27/2017   IDA (iron deficiency anemia) 02/20/2017   Chronic fatigue 02/03/2017   Left leg pain 11/29/2016   History of colonic polyps 12/02/2015   PCP NOTES >>>>>>>>>>>>>>>>>>>>>>>>> 02/13/2015   Anxiety 08/04/2014   Annual physical exam 04/29/2014   Fibromyalgia syndrome 02/04/2014   Dysphagia 01/20/2014   Chronic cough 01/08/2014   DOE (dyspnea on exertion) 12/26/2013   Diabetes (HCC) 09/24/2013   Abdominal pain, chronic, right flank -upper quadrant 07/02/2012   Class 2 severe obesity with serious comorbidity and body mass index (BMI) of 38.0 to 38.9 in adult [facility]) 09/30/2009   Hypersomnia with sleep apnea 09/28/2009   Essential hypertension 06/24/2009   DIZZINESS 06/24/2009   HEADACHE 06/24/2009   VENOUS INSUFFICIENCY, LEGS 11/19/2008   Acute pulmonary embolism (HCC) 05/19/2008   Pulmonary nodule 08/26/2007   Stricture and stenosis of esophagus 05/22/2007   GERD 01/31/2007   IBS (irritable bowel syndrome) 01/31/2007   MITRAL VALVE PROLAPSE, HX OF 01/31/2007    PCP: Devonna Foley MD  REFERRING PROVIDER: Mort Ards, MD   REFERRING DIAG: M54.50 (ICD-10-CM) - Low back pain   Rationale for Evaluation and Treatment: Rehabilitation  THERAPY DIAG:  Other low back pain  Other abnormalities of gait and mobility  Muscle weakness (generalized)  ONSET DATE: Last 2 yrs  SUBJECTIVE:  SUBJECTIVE STATEMENT: Pt states she was very sore after the last land based Rx.  Pt has been doing well in the pool.  Pt states she had some pain during last Rx when she took a lateral step.  Pt states she took a bad step.  Pt report it took about 3-4 days for the pain calm down.  Pt took Tylenol  earlier today.   Pt states she is able to get up from a seated position at home better.  Pt states she is able to get out of bed easier.  Pt states she hasn't had an "attack/episode" since February.  Pt reports compliance with HEP.   POOL ACCESS: currently none, but  plans to get membership at [employer] at d/c.   PERTINENT HISTORY:   Lumbar spondylosis R TKR in 2021, L knee arthroscopy in 2014, DM type 2, Fibromyalgia   PAIN:  Are you having pain? Yes: NPRS scale: currently 4.5/10 Pain location:  central lumbar and bilat lumbar flanks Pain description: ache Aggravating factors: sitting >30 minutes, lying on left side or too long on back Relieving factors: heating pad, pain patches  PRECAUTIONS: None  RED FLAGS: None   WEIGHT BEARING RESTRICTIONS: No  FALLS:  Has patient fallen in last 6 months? No   OCCUPATION: retired; Clinical cytogeneticist  PLOF: Independent  PATIENT GOALS: improve strength, get back into exercise.  NEXT MD VISIT: as needed  OBJECTIVE:  Note: Objective measures were completed at Evaluation unless otherwise noted.  DIAGNOSTIC FINDINGS:  Imaging from today does demonstrate mild degenerative disc disease at L4-L5 and L5-S1   PATIENT SURVEYS:  Modified Oswestry 15/45   COGNITION: Overall cognitive status: Within functional limits for tasks assessed     SENSATION: WFL  MUSCLE LENGTH: Hamstrings: Bilateral hamstring    POSTURE: rounded shoulders, forward head, and left knee valgus  PALPATION: Center l4-5; L5-S1  LUMBAR ROM:  wfl  LOWER EXTREMITY ROM:     wfl  LOWER  EXTREMITY MMT:    MMT Right eval Left eval R / L 4/8  Hip flexion 40.6 37.1 21.1 / 36.1  Hip extension     Hip abduction 27.4 26.7 25.9 / 20.4  Hip adduction     Hip internal rotation     Hip external rotation     Knee flexion     Knee extension 47.1 29.3 29.4/ 30.2  Ankle dorsiflexion     Ankle plantarflexion     Ankle inversion     Ankle eversion      (Blank rows = not tested)  LUMBAR SPECIAL TESTS:  Slump test: Negative  FUNCTIONAL TESTS:  30 s STS 4 from pool bench with ue assist (norm 10-14 ) Timed up and go (TUG): 14.59 4 stage balance: passed 1&2; Tandem x 10s (unsteady); SLS x 6s     07/31/23   30s STS from pool bench: 5   TUG 13.51   4 stage balance: SLS: 14s    09/04/2023   30 sec STS test:  6.5 reps with UE support toward the end of the test TUG:  8.86 sec       GAIT: Distance walked: 469ft Assistive device utilized: None Level of assistance: Complete Independence Comments: Left knee valgus knocking right.  TREATMENT  09/04/23  Reviewed current function, pain level, response to prior Rx, and HEP compliance.   See above for 30s STS and TUG    Supine PPT 2x10 PT educated pt in correct performance and palpation of TrA contractions.  Pt performed  supine TrA contractions with and without 5 sec hold.   Supine bridge with TrA 2x10 Supine alt UE/LE with TrA S/L hip abd x 10 reps bilat Supine manual HS stretch 2 reps x 20-30 sec bilat    [facility] Adult PT Treatment:                                                DATE: 08/31/23 Pt seen for aquatic therapy today.  Treatment took place in water 3.5-4.75 ft in depth at the [facility] pool. Temp of water was 91.  Pt entered/exited the pool via stairs with hand rail.   *unsupported: walking forward/ backward in 4+ ft   *Farmers carry yellow HB bilaterally then unilaterally, marching forward, back *side stepping with rainbow hand float->yellow hand floats with shoulder add/abd -> sidetep into wide  squat * Straddled yellow noodle,: cycling  for relief of low back pain *solid noodle pull down to thighs in wide stance x 5 -> hip hinge for stretch -> hollow long noodle pull down in staggered stance  x 5 each -> back to wide stance with hollow noodle * return to walking forward/ backward  * return to cycling while suspended on noodle   [facility] Adult PT Treatment:                                                DATE: 08/29/23 Pt seen for aquatic therapy today.  Treatment took place in water 3.5-4.75 ft in depth at the [facility] pool. Temp of water was 91.  Pt entered/exited the pool via stairs with hand rail.   *unsupported: walking forward/ backward/side ways in 4+ ft   *Farmers carry yellow HB bilaterally then unilaterally forward, back *side stepping with rbhb->yellow  shoulder add/abd x 2 widths-> side lunge x 4 widths *hip hinge; Verbal and tactile cues.  Good execution. Pausing in "L" stretch position for stretch * staggered stances with reciprocal arm swing with medium resistance bells;3 sets 8 slow/8 fast then wide 2 x 6 fast/slow *solid noodle puul down wide stance and staggered stance x 10 * Straddled yellow noodle,: cycling multiple laps   Pt requires the buoyancy and hydrostatic pressure of water for support, and to offload joints by unweighting joint load by at least 50 % in navel deep water and by at least 75-80% in chest to neck deep water.  Viscosity of the water is needed for resistance of strengthening. Water current perturbations provides challenge to standing balance requiring increased core activation.   [facility] Adult PT Treatment:                                                DATE: 08/08/23 Pt seen for aquatic therapy today.  Treatment took place in water 3.5-4.75 ft in depth at the [facility] pool. Temp of water was 91.  Pt entered/exited the pool via stairs with hand rail.   *unsupported: walking forward/ backward in 4+ ft of water 4 laps,  *side  stepping ue RBHB ue add/abd *Farmers carry yellow HB bilaterally then unilaterally forward, back *side  stepping with rbhb  shoulder add/abd x 2 widths-> side lunge x 4 widths * wide stance with reciprocal arm swing with medium resistance bells;3 sets 6 slow/6 fast then staggered stances 3 x 5 fast/slow *solid noodle press wide stance then staggered stances x 10. Cues for core engagement * Straddled yellow noodle,: cycling multiple laps   Pt requires the buoyancy and hydrostatic pressure of water for support, and to offload joints by unweighting joint load by at least 50 % in navel deep water and by at least 75-80% in chest to neck deep water.  Viscosity of the water is needed for resistance of strengthening. Water current perturbations provides challenge to standing balance requiring increased core activation.                     PATIENT EDUCATION:  Education details: objective findings and progress, rationale for interventions, HEP, POC, and exercise form.  PT answered pt's questions.   Person educated: Patient Education method: Explanation, Demonstration, Tactile cues, Verbal cues Education comprehension: verbalized understanding, returned demonstration, verbal cues required, tactile cues required, and needs further education     HOME EXERCISE PROGRAM: Access Code: [email or web address] URL: [email or web address] Date: 08/02/2023 Prepared by: Gellen April Erman Hayward  Exercises - Heel Raises with Counter Support  - 2-3 x daily - 1 sets - 8-10 reps - Supine Bridge  - 1 x daily - 7 x weekly - 2 sets - 10 reps - Sidelying Hip Abduction  - 1 x daily - 7 x weekly - 2 sets - 10 reps - Dead Bug  - 1 x daily - 7 x weekly - 2 sets - 10 reps - Standing with Back Flat Against Wall  - 1 x daily - 7 x weekly - 2 sets - 10 reps  ASSESSMENT:  CLINICAL IMPRESSION: Pt presents to PT for a land based treatment.  Pt reports improved performance of transfers.  PT worked on core and hip  strengthening and assessed functional mobility tests today.  She demonstrates improved scores on both 30s STS test and TUG.  Pt is progressing toward LTG #5 and met LTG #6.  Pt performed exercises well with cuing and instruction in correct form.  PT spent time instructing pt in correct performance and palpation of TrA contraction.  She responded well to Rx and had no c/o's after Rx.        OBJECTIVE IMPAIRMENTS: Abnormal gait, decreased activity tolerance, decreased balance, impaired flexibility, and pain.   ACTIVITY LIMITATIONS: sleeping, stairs, transfers, and locomotion level  PARTICIPATION LIMITATIONS: shopping and community activity  PERSONAL FACTORS: Fitness are also affecting patient's functional outcome.   REHAB POTENTIAL: Good  CLINICAL DECISION MAKING: Evolving/moderate complexity  EVALUATION COMPLEXITY: Moderate   GOALS: Goals reviewed with patient? Yes  SHORT TERM GOALS: Target date: 07/07/23  Pt will tolerate full aquatic sessions consistently without increase in pain and with improving function to demonstrate good toleration and effectiveness of intervention.  Baseline: 07/10/23: reports improving tolerance w/ aquatics Goal status: MET  2. Pt will report walking x 15 minutes 5/7 days a week to demonstrate committment to increasing her physical activity Baseline: walking 30 min (on walking pad)  Goal status: MET - 07/17/23  3.  Pt will report a reduction of LBP by at least 25% Baseline: see chart 07/10/23: continues to endorse fluctuations 07/25/23: 40% Goal status: Met 07/25/23   LONG TERM GOALS: Target date: 09/14/23  Pt to improve on ODI by  20 % to demonstrate statistically  significant Improvement in function. Baseline: 33% Goal status: INITIAL  2.  Pt will be indep with final HEP's (land and aquatic as appropriate) for continued management of condition Baseline:  Goal status: INITIAL  3.  Pt will report decrease in pain by at least 75% for improved  toleration to activity/quality of life and to demonstrate improved management of pain.  Baseline: see chart; ~40% 07/31/23 Goal status: In progress  4.  Pt will improve strength in LE up towards 10lbs to demonstrate improved overall physical function Baseline: see chart Goal status: In progress 07/31/23  5.  Pt will improve on 30s STS test to <or= 8 to demonstrate improving functional lower extremity strength, transitional movements, and balance Baseline: 4; 5 07/31/23 ; 6.5  on 5/13 Goal status: In progress  6.  Pt will improve on Tug test to <or=13s (norm) to demonstrate improvement in lower extremity function, mobility and decreased fall risk. Baseline: 14.59; 13.51 (07/31/23) ; 8.66 (09/04/23) Goal status:  GOAL MET  5/13  PLAN:  PT FREQUENCY: 2x/week  PT DURATION: 6 weeks  PLANNED INTERVENTIONS: 97164- PT Re-evaluation, 97110-Therapeutic exercises, 97530- Therapeutic activity, 97112- Neuromuscular re-education, 97535- Self Care, 11914- Manual therapy, [phone]- Gait training, [phone]- Orthotic Fit/training, [phone]- Aquatic Therapy, [phone]- Ionotophoresis 4mg /ml Dexamethasone , Patient/Family education, Balance training, Stair training, Taping, Dry Needling, Joint mobilization, Cryotherapy, and Moist heat.  PLAN FOR NEXT SESSION: aquatic sessions for pain management and to initiate muscle lengthening/strengthening and balance then incorporate land based intervention for added load bearing strengthening and final HEP for continued management of chronic condition   Trina Fujita III PT, DPT 09/05/23 10:48 PM  [facility] [address] [place], [place], [address] Phone: [phone]   Fax:  [phone]

## 2023-09-04 ENCOUNTER — Telehealth (INDEPENDENT_AMBULATORY_CARE_PROVIDER_SITE_OTHER): Payer: Self-pay | Admitting: Internal Medicine

## 2023-09-04 ENCOUNTER — Encounter (HOSPITAL_BASED_OUTPATIENT_CLINIC_OR_DEPARTMENT_OTHER): Payer: Self-pay | Admitting: Physical Therapy

## 2023-09-04 ENCOUNTER — Ambulatory Visit (HOSPITAL_BASED_OUTPATIENT_CLINIC_OR_DEPARTMENT_OTHER): Admitting: Physical Therapy

## 2023-09-04 DIAGNOSIS — M5459 Other low back pain: Secondary | ICD-10-CM

## 2023-09-04 DIAGNOSIS — M6281 Muscle weakness (generalized): Secondary | ICD-10-CM

## 2023-09-04 DIAGNOSIS — R2689 Other abnormalities of gait and mobility: Secondary | ICD-10-CM | POA: Diagnosis not present

## 2023-09-04 NOTE — Telephone Encounter (Signed)
 Patient called in stating she took her last Mounjaro shot on 5/12 and her next appt is not until 5/28 and she would like a refill. Please follow up with the patient. Pt reports she is not available today from 3p-5p and if you call to leave her a vm.

## 2023-09-04 NOTE — Telephone Encounter (Signed)
 Called pt and let her know there is a refill at [facility]

## 2023-09-05 ENCOUNTER — Ambulatory Visit (INDEPENDENT_AMBULATORY_CARE_PROVIDER_SITE_OTHER)

## 2023-09-05 ENCOUNTER — Other Ambulatory Visit: Payer: Self-pay | Admitting: Hematology & Oncology

## 2023-09-05 ENCOUNTER — Encounter (HOSPITAL_BASED_OUTPATIENT_CLINIC_OR_DEPARTMENT_OTHER): Payer: Self-pay | Admitting: Physical Therapy

## 2023-09-05 ENCOUNTER — Other Ambulatory Visit: Payer: Self-pay | Admitting: Internal Medicine

## 2023-09-05 VITALS — BP 122/76 | Ht 68.0 in | Wt 228.0 lb

## 2023-09-05 DIAGNOSIS — Z0000 Encounter for general adult medical examination without abnormal findings: Secondary | ICD-10-CM | POA: Diagnosis not present

## 2023-09-05 DIAGNOSIS — Z5320 Procedure and treatment not carried out because of patient's decision for unspecified reasons: Secondary | ICD-10-CM

## 2023-09-05 NOTE — Progress Notes (Signed)
 Because this visit was a virtual/telehealth visit,  certain criteria was not obtained, such a blood pressure, CBG if applicable, and timed get up and go. Any medications not marked as "taking" were not mentioned during the medication reconciliation part of the visit. Any vitals not documented were not able to be obtained due to this being a telehealth visit or patient was unable to self-report a recent blood pressure reading due to a lack of equipment at home via telehealth. Vitals that have been documented are verbally provided by the patient.   This visit was performed by a medical professional under my direct supervision. I was immediately available for consultation/collaboration. I have reviewed and agree with the Annual Wellness Visit documentation.  Subjective:   Jennifer [name] is a 73 y.o. who presents for a Medicare Wellness preventive visit.  As a reminder, Annual Wellness Visits don't include a physical exam, and some assessments may be limited, especially if this visit is performed virtually. We may recommend an in-person visit if needed.  Visit Complete: Virtual I connected with  Jennifer [name] on 09/05/23 by a audio enabled telemedicine application and verified that I am speaking with the correct person using two identifiers.  Patient Location: Home  Provider Location: Home Office  I discussed the limitations of evaluation and management by telemedicine. The patient expressed understanding and agreed to proceed.  Vital Signs: Because this visit was a virtual/telehealth visit, some criteria may be missing or patient reported. Any vitals not documented were not able to be obtained and vitals that have been documented are patient reported.  VideoDeclined- This patient declined Librarian, academic. Therefore the visit was completed with audio only.  Persons Participating in Visit: Patient.  AWV Questionnaire: No: Patient Medicare AWV questionnaire was not  completed prior to this visit.  Cardiac Risk Factors include: advanced age (>9men, >24 women);diabetes mellitus;obesity (BMI >30kg/m2);hypertension     Objective:     Today's Vitals   09/05/23 1310  BP: 122/76  Weight: 228 lb (103.4 kg)  Height: 5\' 8"  (1.727 m)   Body mass index is 34.67 kg/m.     09/05/2023    1:10 PM 06/18/2023    2:57 PM 03/30/2023   10:58 AM 03/16/2023    3:22 PM 03/12/2023    7:53 AM 02/06/2023   10:34 AM 12/27/2022    3:18 PM  Advanced Directives  Does Patient Have a Medical Advance Directive? No No No No No No No  Would patient like information on creating a medical advance directive? No - Patient declined No - Patient declined No - Patient declined No - Patient declined No - Patient declined No - Patient declined No - Patient declined    Current Medications (verified) Outpatient Encounter Medications as of 09/05/2023  Medication Sig   acetaminophen  (TYLENOL ) 500 MG tablet Take 1 tablet (500 mg total) by mouth every 6 (six) hours as needed. (Patient taking differently: Take 500 mg by mouth every 6 (six) hours as needed for moderate pain (pain score 4-6), mild pain (pain score 1-3) or headache.)   Apoaequorin (PREVAGEN) 10 MG CAPS Take 10 mg by mouth daily. prevagen   cycloSPORINE  (RESTASIS ) 0.05 % ophthalmic emulsion Place 1 drop into both eyes 2 (two) times daily.   escitalopram  (LEXAPRO ) 10 MG tablet Take 1 tablet (10 mg total) by mouth daily.   hydrocortisone  valerate cream (WESTCORT ) 0.2 % Apply 1 Application topically daily as needed (itching).   hyoscyamine  (LEVSIN) 0.125 MG tablet Take 1  tablet (0.125 mg total) by mouth every 4 (four) hours as needed.   Multiple Vitamins-Minerals (MULTIVITAMIN WITH MINERALS) tablet Take 2 tablets by mouth daily. Vitafusion woman gummy   NEXIUM  40 MG capsule Take 1 capsule (40 mg total) by mouth daily before breakfast.   olopatadine (PATADAY) 0.1 % ophthalmic solution Place 1 drop into the left eye 2 (two) times  daily.   rosuvastatin  (CRESTOR ) 10 MG tablet Take 1 tablet (10 mg total) by mouth at bedtime.   temazepam  (RESTORIL ) 30 MG capsule Take 1 capsule (30 mg total) by mouth at bedtime as needed. for sleep   tirzepatide (MOUNJARO) 7.5 MG/0.5ML Pen Inject 7.5 mg into the skin once a week.   triamterene -hydrochlorothiazide  (MAXZIDE -25) 37.5-25 MG tablet Take 0.5 tablets by mouth daily.   XARELTO  2.5 MG TABS tablet TAKE TWO TABLETS BY MOUTH DAILY   No facility-administered encounter medications on file as of 09/05/2023.    Allergies (verified) Doxepin , Morphine and codeine, Sulfonamide derivatives, and Promethazine hcl   History: Past Medical History:  Diagnosis Date   Allergy    Anemia    Arthritis    Cataract    bil cataracts removed   Chronic chest pain    Chronic fatigue 02/03/2017   Clotting disorder [facility])    Pulmonary Embolis 2010 & 2015   Colon polyps    inflamed partially ulcerated hyperplastic polyp   Diabetes (HCC) 09/24/2013   no meds   Diverticulosis    Dysphagia esophageal phase - chronic after 3 fundoplications 01/20/2014   Edema of both lower extremities    Esophageal stenosis    Stricture after fundoplication REDO   Fibromyalgia    GERD (gastroesophageal reflux disease)    Glaucoma suspect    Heart murmur    no problems    Hemorrhoids, internal, with bleeding 06/27/2017   History of hiatal hernia    Hypertension    IBS (irritable bowel syndrome)    Lactose intolerance    Mitral valve prolapse    OSA (obstructive sleep apnea)    on CPAP-Mild - Sleep study 2004   Pre-diabetes    Pulmonary embolus (HCC)    2010 and 12-2013   Pulmonary nodule 12/13/2022   seen on CT chest xray on 12/16/22 - left upper lobe   Sleep apnea    wears CPAP   Thyroid  nodule      dr. watching    Tinnitus    Subjective   Zoster 2014   R flank   Past Surgical History:  Procedure Laterality Date   ABDOMINAL HYSTERECTOMY     still has 1 ovary    APPENDECTOMY     BRAIN SURGERY   1995   Stem surgery, Arnold Chiari Malformation   CATARACT EXTRACTION Bilateral 2017   cataracts, Dr. Lasandra Points   CHOLECYSTECTOMY     COLONOSCOPY     ESOPHAGEAL DILATION N/A 02/06/2023   Procedure: ESOPHAGEAL DILATION;  Surgeon: Hilarie Lovely, MD;  Location: [facility] OR;  Service: Thoracic;  Laterality: N/A;   ESOPHAGEAL DILATION N/A 02/13/2023   Procedure: ESOPHAGEAL DILATION;  Surgeon: Hilarie Lovely, MD;  Location: [facility] OR;  Service: Thoracic;  Laterality: N/A;   ESOPHAGEAL DILATION N/A 03/12/2023   Procedure: ESOPHAGEAL DILATION;  Surgeon: Hilarie Lovely, MD;  Location: [facility] OR;  Service: Thoracic;  Laterality: N/A;   ESOPHAGEAL MANOMETRY N/A 08/06/2019   Procedure: ESOPHAGEAL MANOMETRY (EM);  Surgeon: Kenney Peacemaker, MD;  Location: [facility] ENDOSCOPY;  Service: Endoscopy;  Laterality: N/A;   ESOPHAGOGASTRODUODENOSCOPY  N/A 02/06/2023   Procedure: ESOPHAGOGASTRODUODENOSCOPY (EGD);  Surgeon: Hilarie Lovely, MD;  Location[facility] OR;  Service: Thoracic;  Laterality: N/A;  With Savary dilation   ESOPHAGOGASTRODUODENOSCOPY N/A 02/13/2023   Procedure: ESOPHAGOGASTRODUODENOSCOPY (EGD);  Surgeon: Hilarie Lovely, MD;  Location: [facility] OR;  Service: Thoracic;  Laterality: N/A;   ESOPHAGOGASTRODUODENOSCOPY N/A 03/12/2023   Procedure: ESOPHAGOGASTRODUODENOSCOPY (EGD);  Surgeon: Hilarie Lovely, MD;  Location: [facility] OR;  Service: Thoracic;  Laterality: N/A;   HEMORRHOID BANDING     HERNIA REPAIR     Hital Hernia   JOINT REPLACEMENT Right    knee   KNEE ARTHROSCOPY  05/16/2012   Procedure: ARTHROSCOPY KNEE;  Surgeon: Jonna Netter, MD;  [facility];  Service: Orthopedics;  Laterality: Left;   KNEE ARTHROSCOPY Right 01/2018   NISSEN FUNDOPLICATION     X 3 lab, open x 2 last with mesh   POLYPECTOMY     REDUCTION MAMMAPLASTY Bilateral 2007   TOTAL KNEE ARTHROPLASTY Right 08/25/2019   Procedure: RIGHT TOTAL KNEE ARTHROPLASTY;  Surgeon: Liliane Rei, MD;  Location: [facility] ORS;  Service:  Orthopedics;  Laterality: Right;    UPPER GASTROINTESTINAL ENDOSCOPY     Family History  Problem Relation Age of Onset   Diabetes Mother    Rheum arthritis Mother    Hypertension Mother    Hypertension Father    Alcoholism Father    Heart disease Father    Sudden death Father    Rheum arthritis Sister    Hypertension Sister    Arthritis Sister    Rheum arthritis Sister    Rheum arthritis Sister    CAD Sister        MI age 23   Fibromyalgia Sister    Colon cancer Maternal Grandmother        dx with colon ca laster in life - lived to be 104   Crohn's disease Maternal Aunt    Stomach cancer Maternal Uncle    Lung cancer Maternal Uncle    Breast cancer Neg Hx    Esophageal cancer Neg Hx    Rectal cancer Neg Hx    Prostate cancer Neg Hx    Pancreatic cancer Neg Hx    Colon polyps Neg Hx    Social History   Socioeconomic History   Marital status: Married    Spouse name: Erna He   Number of children: 1   Years of education: Not on file   Highest education level: Associate degree: occupational, Scientist, product/process development, or vocational program  Occupational History   Occupation: retired form the [employer] 04-2015    Employer: [employer]  Tobacco Use   Smoking status: Never   Smokeless tobacco: Never   Tobacco comments:    never used tobacco  Vaping Use   Vaping status: Never Used  Substance and Sexual Activity   Alcohol use: No    Alcohol/week: 0.0 standard drinks of alcohol   Drug use: No   Sexual activity: Not Currently    Birth control/protection: Surgical    Comment: Hysterectomy  Other Topics Concern   Not on file  Social History Narrative   [employer] Graduate - Technical school   Married '70 for 2 years, divorced; re-married '86 for 2.5 years, divorced; re-married '90 for 1 year, divorced; re-married '00   1 son - '42   Sister - Died @ 32 [place]       Social Drivers of Corporate investment banker Strain: Low Risk  (09/05/2023)  Overall Financial Resource Strain  (CARDIA)    Difficulty of Paying Living Expenses: Not hard at all  Food Insecurity: No Food Insecurity (09/05/2023)   Hunger Vital Sign    Worried About Running Out of Food in the Last Year: Never true    Ran Out of Food in the Last Year: Never true  Transportation Needs: No Transportation Needs (09/05/2023)   PRAPARE - Administrator, Civil Service (Medical): No    Lack of Transportation (Non-Medical): No  Physical Activity: Insufficiently Active (09/05/2023)   Exercise Vital Sign    Days of Exercise per Week: 2 days    Minutes of Exercise per Session: 20 min  Stress: No Stress Concern Present (09/05/2023)   [employer] of Occupational Health - Occupational Stress Questionnaire    Feeling of Stress : Not at all  Social Connections: Socially Integrated (09/05/2023)   Social Connection and Isolation Panel [NHANES]    Frequency of Communication with Friends and Family: More than three times a week    Frequency of Social Gatherings with Friends and Family: Once a week    Attends Religious Services: More than 4 times per year    Active Member of [employer] or Organizations: Yes    Attends Banker Meetings: Never    Marital Status: Married    Tobacco Counseling Counseling given: Not Answered Tobacco comments: never used tobacco    Clinical Intake:  Pre-visit preparation completed: Yes  Pain : No/denies pain     BMI - recorded: 34.67 Nutritional Status: BMI > 30  Obese Nutritional Risks: None Diabetes: Yes CBG done?: No Did pt. bring in CBG monitor from home?: No  Lab Results  Component Value Date   HGBA1C 6.2 (H) 06/08/2023   HGBA1C 6.3 (H) 12/12/2022   HGBA1C 6.3 06/05/2022     How often do you need to have someone help you when you read instructions, pamphlets, or other written materials from your doctor or pharmacy?: 1 - Never  Interpreter Needed?: No  Comments: 12th grade , some college Information entered by :: [employer]   Activities of Daily Living     09/05/2023    1:15 PM 03/12/2023    7:52 AM  In your present state of health, do you have any difficulty performing the following activities:  Hearing? 0 0  Vision? 0 0  Difficulty concentrating or making decisions? 0 0  Walking or climbing stairs? 0   Dressing or bathing? 0   Doing errands, shopping? 0   Preparing Food and eating ? N   Using the Toilet? N   In the past six months, have you accidently leaked urine? N   Do you have problems with loss of bowel control? N   Managing your Medications? N   Managing your Finances? N   Housekeeping or managing your Housekeeping? N     Patient Care Team: Ezell Hollow, MD as PCP - General (Internal Medicine) Maria Shiner Sherryll Donald, MD as Consulting Physician (Oncology) Kenney Peacemaker, MD as Consulting Physician (Gastroenterology) Orvan Blanch, MD as Consulting Physician (Orthopedic Surgery) Amedeo Jupiter, MD as Consulting Physician (Ophthalmology) Elly Habermann, MD as Consulting Physician (Orthopedic Surgery) Florance Hun, MD as Consulting Physician (Ophthalmology)  Indicate any recent Medical Services you may have received from other than [facility] providers in the past year (date may be approximate).     Assessment:    This is a routine wellness examination for Jennifer [name].  Hearing/Vision screen Hearing Screening -  Comments:: No hearing difficulties Vision Screening - Comments:: No vision difficulties   Goals Addressed             This Visit's Progress    Increase physical activity   On track    Patient Stated       Get A1c down        Depression Screen     09/05/2023    1:17 PM 08/17/2023    1:26 PM 03/30/2023   10:58 AM 02/05/2023    1:17 PM 10/04/2022    1:20 PM 08/28/2022    2:26 PM 06/05/2022    1:01 PM  PHQ 2/9 Scores  PHQ - 2 Score 0 1 0 0 1 1 0  PHQ- 9 Score 0 3   1      Fall Risk     09/05/2023    1:15 PM 03/30/2023   10:29 AM 02/05/2023    1:17 PM 10/04/2022     1:20 PM 08/28/2022    1:21 PM  Fall Risk   Falls in the past year? 0 0 0 0 0  Number falls in past yr: 0  0 0 0  Injury with Fall? 0  0 0 0  Risk for fall due to : No Fall Risks    No Fall Risks  Follow up Falls prevention discussed;Falls evaluation completed  Falls evaluation completed;Education provided Falls evaluation completed Falls evaluation completed    MEDICARE RISK AT HOME:  Medicare Risk at Home Any stairs in or around the home?: Yes If so, are there any without handrails?: No Home free of loose throw rugs in walkways, pet beds, electrical cords, etc?: Yes Adequate lighting in your home to reduce risk of falls?: Yes Life alert?: No Use of a cane, walker or w/c?: No Grab bars in the bathroom?: Yes Shower chair or bench in shower?: Yes Elevated toilet seat or a handicapped toilet?: Yes  TIMED UP AND GO:  Was the test performed?  No  Cognitive Function: 6CIT completed        09/05/2023    1:13 PM 08/28/2022    2:34 PM 08/25/2021    3:16 PM  6CIT Screen  What Year? 0 points 0 points 0 points  What month? 0 points 0 points 0 points  What time? 0 points 0 points 0 points  Count back from 20 0 points 0 points 0 points  Months in reverse 0 points 0 points 0 points  Repeat phrase 0 points 0 points 0 points  Total Score 0 points 0 points 0 points    Immunizations Immunization History  Administered Date(s) Administered   Fluad Quad(high Dose 65+) 12/06/2018, 12/30/2021   Influenza Split 01/17/2011, 01/08/2012, 01/31/2023   Influenza Whole 01/15/2008, 01/21/2009, 01/25/2010   Influenza, High Dose Seasonal PF 01/28/2018   Influenza,inj,Quad PF,6+ Mos 12/26/2012, 01/07/2014, 01/04/2015, 01/11/2016, 02/16/2017   Influenza,inj,quad, With Preservative 01/22/2017   Influenza-Unspecified 12/06/2018, 01/08/2020, 01/06/2021   PFIZER(Purple Top)SARS-COV-2 Vaccination 05/15/2019, 06/06/2019, 01/19/2020, 07/30/2020   PNEUMOCOCCAL CONJUGATE-20 06/08/2023   Pfizer Covid-19 Vaccine  Bivalent Booster 43yrs & up 01/26/2021, 01/26/2022   Pfizer(Comirnaty)Fall Seasonal Vaccine 12 years and older 01/31/2023   Pneumococcal Conjugate-13 11/19/2015, 12/06/2018   Pneumococcal Polysaccharide-23 04/29/2014   RSV,unspecified 06/05/2022   Tdap 08/23/2012, 07/07/2022   Zoster Recombinant(Shingrix ) 05/23/2018, 10/30/2018   Zoster, Live 10/03/2012    Screening Tests Health Maintenance  Topic Date Due   OPHTHALMOLOGY EXAM  06/29/2023   COVID-19 Vaccine (8 - Pfizer risk 2024-25 season) 12/05/2023 (Originally 08/01/2023)  INFLUENZA VACCINE  11/23/2023   HEMOGLOBIN A1C  12/06/2023   Diabetic kidney evaluation - Urine ACR  06/07/2024   FOOT EXAM  06/07/2024   Diabetic kidney evaluation - eGFR measurement  06/17/2024   Medicare Annual Wellness (AWV)  09/04/2024   MAMMOGRAM  06/04/2025   Colonoscopy  01/18/2028   DTaP/Tdap/Td (3 - Td or Tdap) 07/06/2032   Pneumonia Vaccine 57+ Years old  Completed   DEXA SCAN  Completed   Hepatitis C Screening  Completed   Zoster Vaccines- Shingrix   Completed   HPV VACCINES  Aged Out   Meningococcal B Vaccine  Aged Out    Health Maintenance  Health Maintenance Due  Topic Date Due   OPHTHALMOLOGY EXAM  06/29/2023   Health Maintenance Items Addressed:   Additional Screening:  Vision Screening: Recommended annual ophthalmology exams for early detection of glaucoma and other disorders of the eye.  Dental Screening: Recommended annual dental exams for proper oral hygiene  Community Resource Referral / Chronic Care Management: CRR required this visit?  No   CCM required this visit?  No   Plan:    I have personally reviewed and noted the following in the patient's chart:   Medical and social history Use of alcohol, tobacco or illicit drugs  Current medications and supplements including opioid prescriptions. Patient is not currently taking opioid prescriptions. Functional ability and status Nutritional status Physical  activity Advanced directives List of other physicians Hospitalizations, surgeries, and ER visits in previous 12 months Vitals Screenings to include cognitive, depression, and falls Referrals and appointments  In addition, I have reviewed and discussed with patient certain preventive protocols, quality metrics, and best practice recommendations. A written personalized care plan for preventive services as well as general preventive health recommendations were provided to patient.   Freeda Jerry, [place]   09/05/2023   After Visit Summary: (MyChart) Due to this being a telephonic visit, the after visit summary with patients personalized plan was offered to patient via MyChart   Notes: Nothing significant to report at this time.

## 2023-09-05 NOTE — Patient Instructions (Signed)
 Jennifer [name] , Thank you for taking time out of your busy schedule to complete your Annual Wellness Visit with me. I enjoyed our conversation and look forward to speaking with you again next year. I, as well as your care team,  appreciate your ongoing commitment to your health goals. Please review the following plan we discussed and let me know if I can assist you in the future. Your Game plan/ To Do List    Referrals: If you haven't heard from the office you've been referred to, please reach out to them at the phone provided.   Follow up Visits: Next Medicare AWV with our clinical staff: 09/11/2023   Have you seen your provider in the last 6 months (3 months if uncontrolled diabetes)? Yes Next Office Visit with your provider: 12/07/2023  Clinician Recommendations:  Aim for 30 minutes of exercise or brisk walking, 6-8 glasses of water, and 5 servings of fruits and vegetables each day.       This is a list of the screening recommended for you and due dates:  Health Maintenance  Topic Date Due   Eye exam for diabetics  06/29/2023   COVID-19 Vaccine (8 - Pfizer risk 2024-25 season) 12/05/2023*   Flu Shot  11/23/2023   Hemoglobin A1C  12/06/2023   Yearly kidney health urinalysis for diabetes  06/07/2024   Complete foot exam   06/07/2024   Yearly kidney function blood test for diabetes  06/17/2024   Medicare Annual Wellness Visit  09/04/2024   Mammogram  06/04/2025   Colon Cancer Screening  01/18/2028   DTaP/Tdap/Td vaccine (3 - Td or Tdap) 07/06/2032   Pneumonia Vaccine  Completed   DEXA scan (bone density measurement)  Completed   Hepatitis C Screening  Completed   Zoster (Shingles) Vaccine  Completed   HPV Vaccine  Aged Out   Meningitis B Vaccine  Aged Out  *Topic was postponed. The date shown is not the original due date.    Advanced directives: (Declined) Advance directive discussed with you today. Even though you declined this today, please call our office should you change your  mind, and we can give you the proper paperwork for you to fill out. Advance Care Planning is important because it:  [x]  Makes sure you receive the medical care that is consistent with your values, goals, and preferences  [x]  It provides guidance to your family and loved ones and reduces their decisional burden about whether or not they are making the right decisions based on your wishes.  Follow the link provided in your after visit summary or read over the paperwork we have mailed to you to help you started getting your Advance Directives in place. If you need assistance in completing these, please reach out to us  so that we can help you!  See attachments for Preventive Care and Fall Prevention Tips.

## 2023-09-06 ENCOUNTER — Encounter (HOSPITAL_BASED_OUTPATIENT_CLINIC_OR_DEPARTMENT_OTHER): Payer: Self-pay | Admitting: Physical Therapy

## 2023-09-06 ENCOUNTER — Ambulatory Visit (HOSPITAL_BASED_OUTPATIENT_CLINIC_OR_DEPARTMENT_OTHER): Admitting: Physical Therapy

## 2023-09-06 DIAGNOSIS — M6281 Muscle weakness (generalized): Secondary | ICD-10-CM

## 2023-09-06 DIAGNOSIS — M5459 Other low back pain: Secondary | ICD-10-CM

## 2023-09-06 DIAGNOSIS — R2689 Other abnormalities of gait and mobility: Secondary | ICD-10-CM

## 2023-09-06 NOTE — Therapy (Addendum)
 OUTPATIENT PHYSICAL THERAPY THORACOLUMBAR TREATMENT   Patient Name: Jennifer [name] MRN: [id number] DOB:1950-11-07, 73 y.o., female Today's Date: 09/06/2023  END OF SESSION:  PT End of Session - 09/06/23 1546     Visit Number 16    Number of Visits 22    Date for PT Re-Evaluation 09/14/23    Authorization Type medicare    Progress Note Due on Visit 20    PT Start Time 1532    PT Stop Time 1610    PT Time Calculation (min) 38 min    Activity Tolerance Patient tolerated treatment well    Behavior During Therapy [facility] for tasks assessed/performed               Past Medical History:  Diagnosis Date   Allergy    Anemia    Arthritis    Cataract    bil cataracts removed   Chronic chest pain    Chronic fatigue 02/03/2017   Clotting disorder (HCC)    Pulmonary [place] 2010 & 2015   Colon polyps    inflamed partially ulcerated hyperplastic polyp   Diabetes (HCC) 09/24/2013   no meds   Diverticulosis    Dysphagia esophageal phase - chronic after 3 fundoplications 01/20/2014   Edema of both lower extremities    Esophageal stenosis    Stricture after fundoplication REDO   Fibromyalgia    GERD (gastroesophageal reflux disease)    Glaucoma suspect    Heart murmur    no problems    Hemorrhoids, internal, with bleeding 06/27/2017   History of hiatal hernia    Hypertension    IBS (irritable bowel syndrome)    Lactose intolerance    Mitral valve prolapse    OSA (obstructive sleep apnea)    on CPAP-Mild - Sleep study 2004   Pre-diabetes    Pulmonary embolus (HCC)    2010 and 12-2013   Pulmonary nodule 12/13/2022   seen on CT chest xray on 12/16/22 - left upper lobe   Sleep apnea    wears CPAP   Thyroid  nodule      dr. watching    Tinnitus    Subjective   Zoster 2014   R flank   Past Surgical History:  Procedure Laterality Date   ABDOMINAL HYSTERECTOMY     still has 1 ovary    APPENDECTOMY     BRAIN SURGERY  1995   Stem surgery, Arnold Chiari Malformation    CATARACT EXTRACTION Bilateral 2017   cataracts, Dr. Lasandra Points   CHOLECYSTECTOMY     COLONOSCOPY     ESOPHAGEAL DILATION N/A 02/06/2023   Procedure: ESOPHAGEAL DILATION;  Surgeon: Hilarie Lovely, MD;  Location: [facility] OR;  Service: Thoracic;  Laterality: N/A;   ESOPHAGEAL DILATION N/A 02/13/2023   Procedure: ESOPHAGEAL DILATION;  Surgeon: Hilarie Lovely, MD;  Location: [facility] OR;  Service: Thoracic;  Laterality: N/A;   ESOPHAGEAL DILATION N/A 03/12/2023   Procedure: ESOPHAGEAL DILATION;  Surgeon: Hilarie Lovely, MD;  Location: [facility] OR;  Service: Thoracic;  Laterality: N/A;   ESOPHAGEAL MANOMETRY N/A 08/06/2019   Procedure: ESOPHAGEAL MANOMETRY (EM);  Surgeon: Kenney Peacemaker, MD;  Location: [facility] ENDOSCOPY;  Service: Endoscopy;  Laterality: N/A;   ESOPHAGOGASTRODUODENOSCOPY N/A 02/06/2023   Procedure: ESOPHAGOGASTRODUODENOSCOPY (EGD);  Surgeon: Hilarie Lovely, MD;  Location: [facility] OR;  Service: Thoracic;  Laterality: N/A;  With Savary dilation   ESOPHAGOGASTRODUODENOSCOPY N/A 02/13/2023   Procedure: ESOPHAGOGASTRODUODENOSCOPY (EGD);  Surgeon: Hilarie Lovely, MD;  Location: [facility] OR;  Service: Thoracic;  Laterality: N/A;   ESOPHAGOGASTRODUODENOSCOPY N/A 03/12/2023   Procedure: ESOPHAGOGASTRODUODENOSCOPY (EGD);  Surgeon: Hilarie Lovely, MD;  Location: [facility] OR;  Service: Thoracic;  Laterality: N/A;   HEMORRHOID BANDING     HERNIA REPAIR     Hital Hernia   JOINT REPLACEMENT Right    knee   KNEE ARTHROSCOPY  05/16/2012   Procedure: ARTHROSCOPY KNEE;  Surgeon: Jonna Netter, MD;  Location: [facility] OR;  Service: Orthopedics;  Laterality: Left;   KNEE ARTHROSCOPY Right 01/2018   NISSEN FUNDOPLICATION     X 3 lab, open x 2 last with mesh   POLYPECTOMY     REDUCTION MAMMAPLASTY Bilateral 2007   TOTAL KNEE ARTHROPLASTY Right 08/25/2019   Procedure: RIGHT TOTAL KNEE ARTHROPLASTY;  Surgeon: Liliane Rei, MD;  Location: [facility] ORS;  Service: Orthopedics;  Laterality: Right;    UPPER  GASTROINTESTINAL ENDOSCOPY     Patient Active Problem List   Diagnosis Date Noted   Vitamin D  deficiency 12/12/2022   Decreased GFR 12/12/2022   Physically inactive 12/12/2022   Calculus of kidney 07/14/2021   OSA on CPAP 06/08/2021   Circadian rhythm sleep disorder, delayed sleep phase type 06/08/2021   Glaucoma suspect 06/25/2020   S/P total knee arthroplasty, right 08/27/2019   OA (osteoarthritis) of knee 08/25/2019   Primary osteoarthritis of right knee 08/25/2019   Atypical chest pain    Hemorrhoids, internal, with bleeding 06/27/2017   IDA (iron deficiency anemia) 02/20/2017   Chronic fatigue 02/03/2017   Left leg pain 11/29/2016   History of colonic polyps 12/02/2015   PCP NOTES >>>>>>>>>>>>>>>>>>>>>>>>> 02/13/2015   Anxiety 08/04/2014   Annual physical exam 04/29/2014   Fibromyalgia syndrome 02/04/2014   Dysphagia 01/20/2014   Chronic cough 01/08/2014   DOE (dyspnea on exertion) 12/26/2013   Diabetes (HCC) 09/24/2013   Abdominal pain, chronic, right flank -upper quadrant 07/02/2012   Class 2 severe obesity with serious comorbidity and body mass index (BMI) of 38.0 to 38.9 in adult [facility]) 09/30/2009   Hypersomnia with sleep apnea 09/28/2009   Essential hypertension 06/24/2009   DIZZINESS 06/24/2009   HEADACHE 06/24/2009   VENOUS INSUFFICIENCY, LEGS 11/19/2008   Acute pulmonary embolism (HCC) 05/19/2008   Pulmonary nodule 08/26/2007   Stricture and stenosis of esophagus 05/22/2007   GERD 01/31/2007   IBS (irritable bowel syndrome) 01/31/2007   MITRAL VALVE PROLAPSE, HX OF 01/31/2007    PCP: Devonna Foley MD  REFERRING PROVIDER: Mort Ards, MD   REFERRING DIAG: M54.50 (ICD-10-CM) - Low back pain   Rationale for Evaluation and Treatment: Rehabilitation  THERAPY DIAG:  Other low back pain  Other abnormalities of gait and mobility  Muscle weakness (generalized)  ONSET DATE: Last 2 yrs  SUBJECTIVE:  SUBJECTIVE STATEMENT: Pt reports she took Tylenol  prior to appt.  Yesterday was not a good day; weather affected her.   POOL ACCESS: currently none, but  plans to get membership at [employer] at d/c.   PERTINENT HISTORY:   Lumbar spondylosis R TKR in 2021, L knee arthroscopy in 2014, DM type 2, Fibromyalgia   PAIN:  Are you having pain? Yes: NPRS scale: currently 4.5/10 Pain location:  central lumbar and bilat lumbar flanks Pain description: ache Aggravating factors: sitting >30 minutes, lying on left side or too long on back Relieving factors: heating pad, pain patches  PRECAUTIONS: None  RED FLAGS: None   WEIGHT BEARING RESTRICTIONS: No  FALLS:  Has patient fallen in last 6 months? No   OCCUPATION: retired; Clinical cytogeneticist  PLOF: Independent  PATIENT GOALS: improve strength, get back into exercise.  NEXT MD VISIT: as needed  OBJECTIVE:  Note: Objective measures were completed at Evaluation unless otherwise noted.  DIAGNOSTIC FINDINGS:  Imaging from today does demonstrate mild degenerative disc disease at L4-L5 and L5-S1   PATIENT SURVEYS:  Modified Oswestry 15/45   COGNITION: Overall cognitive status: Within functional limits for tasks assessed     SENSATION: WFL  MUSCLE LENGTH: Hamstrings: Bilateral hamstring    POSTURE: rounded shoulders, forward head, and left knee valgus  PALPATION: Center l4-5; L5-S1  LUMBAR ROM:  wfl  LOWER EXTREMITY ROM:     wfl  LOWER EXTREMITY MMT:    MMT Right eval Left eval R / L 4/8  Hip flexion 40.6 37.1 21.1 / 36.1  Hip extension     Hip abduction 27.4 26.7 25.9 / 20.4  Hip adduction     Hip internal rotation     Hip external rotation     Knee flexion     Knee extension 47.1 29.3 29.4/ 30.2  Ankle dorsiflexion     Ankle plantarflexion     Ankle inversion     Ankle eversion       (Blank rows = not tested)  LUMBAR SPECIAL TESTS:  Slump test: Negative  FUNCTIONAL TESTS:  30 s STS 4 from pool bench with ue assist (norm 10-14 ) Timed up and go (TUG): 14.59 4 stage balance: passed 1&2; Tandem x 10s (unsteady); SLS x 6s     07/31/23   30s STS from pool bench: 5   TUG 13.51   4 stage balance: SLS: 14s    09/04/2023   30 sec STS test:  6.5 reps with UE support toward the end of the test TUG:  8.86 sec       GAIT: Distance walked: 433ft Assistive device utilized: None Level of assistance: Complete Independence Comments: Left knee valgus knocking right.  TREATMENT  [facility] Adult PT Treatment:                                                DATE: 09/06/23 Pt seen for aquatic therapy today.  Treatment took place in water 3.5-4.75 ft in depth at the [facility] pool. Temp of water was 91.  Pt entered/exited the pool via stairs with hand rail.   *unsupported: walking forward/ backward in 4+ ft   *Farmers carry bilat rainbow HB  then yellow unilaterally, marching forward, back *side stepping with rainbow hand float with shoulder add/abd  * L stretch (limited tolerance) -> hip hinge with UE on hand  floats x 3 * UE On wall: hip abdct/ addct (not tolerated with LLE); hip flex/ ext (not tolerated with LLE) * --> climbed ladder and moved to 30ft6" lane in lap pool: Straddled yellow noodle:  cycling  for relief of low back pain  09/04/23  Reviewed current function, pain level, response to prior Rx, and HEP compliance.   See above for 30s STS and TUG    Supine PPT 2x10 PT educated pt in correct performance and palpation of TrA contractions.  Pt performed supine TrA contractions with and without 5 sec hold.   Supine bridge with TrA 2x10 Supine alt UE/LE with TrA S/L hip abd x 10 reps bilat Supine manual HS stretch 2 reps x 20-30 sec bilat    [facility] Adult PT Treatment:                                                DATE: 08/31/23 Pt seen for aquatic therapy today.   Treatment took place in water 3.5-4.75 ft in depth at the [facility] pool. Temp of water was 91.  Pt entered/exited the pool via stairs with hand rail.   *unsupported: walking forward/ backward in 4+ ft   *Farmers carry yellow HB bilaterally then unilaterally, marching forward, back *side stepping with rainbow hand float->yellow hand floats with shoulder add/abd -> sidetep into wide squat * Straddled yellow noodle,: cycling  for relief of low back pain *solid noodle pull down to thighs in wide stance x 5 -> hip hinge for stretch -> hollow long noodle pull down in staggered stance  x 5 each -> back to wide stance with hollow noodle * return to walking forward/ backward  * return to cycling while suspended on noodle   [facility] Adult PT Treatment:                                                DATE: 08/29/23 Pt seen for aquatic therapy today.  Treatment took place in water 3.5-4.75 ft in depth at the [facility] pool. Temp of water was 91.  Pt entered/exited the pool via stairs with hand rail.   *unsupported: walking forward/ backward/side ways in 4+ ft   *Farmers carry yellow HB bilaterally then unilaterally forward, back *side stepping with rbhb->yellow  shoulder add/abd x 2 widths-> side lunge x 4 widths *hip hinge; Verbal and tactile cues.  Good execution. Pausing in "L" stretch position for stretch * staggered stances with reciprocal arm swing with medium resistance bells;3 sets 8 slow/8 fast then wide 2 x 6 fast/slow *solid noodle puul down wide stance and staggered stance x 10 * Straddled yellow noodle,: cycling multiple laps  [facility] Adult PT Treatment:                                                DATE: 08/08/23 Pt seen for aquatic therapy today.  Treatment took place in water 3.5-4.75 ft in depth at the [facility] pool. Temp of water was 91.  Pt entered/exited the pool via stairs with hand rail.   *unsupported: walking forward/ backward in  4+ ft of water 4  laps,  *side stepping ue RBHB ue add/abd *Farmers carry yellow HB bilaterally then unilaterally forward, back *side stepping with rbhb  shoulder add/abd x 2 widths-> side lunge x 4 widths * wide stance with reciprocal arm swing with medium resistance bells;3 sets 6 slow/6 fast then staggered stances 3 x 5 fast/slow *solid noodle press wide stance then staggered stances x 10. Cues for core engagement * Straddled yellow noodle,: cycling multiple laps                   PATIENT EDUCATION:  Education details: objective findings and progress, rationale for interventions, HEP, POC, and exercise form.  PT answered pt's questions.   Person educated: Patient Education method: Explanation, Demonstration, Tactile cues, Verbal cues Education comprehension: verbalized understanding, returned demonstration, verbal cues required, tactile cues required, and needs further education     HOME EXERCISE PROGRAM: Access Code: [email or web address] URL: [email or web address] Date: 08/02/2023 Prepared by: Gellen April Erman [place]  Exercises - Heel Raises with Counter Support  - 2-3 x daily - 1 sets - 8-10 reps - Supine Bridge  - 1 x daily - 7 x weekly - 2 sets - 10 reps - Sidelying Hip Abduction  - 1 x daily - 7 x weekly - 2 sets - 10 reps - Dead Bug  - 1 x daily - 7 x weekly - 2 sets - 10 reps - Standing with Back Flat Against Wall  - 1 x daily - 7 x weekly - 2 sets - 10 reps  AQUATIC Access Code: [email or web address] URL: [email or web address] Date: 09/06/2023 Prepared by: [facility] This aquatic home exercise program from Med[employer] utilizes pictures from land based exercises, but has been adapted prior to lamination and issuance.  * not issued yet  ASSESSMENT:  CLINICAL IMPRESSION: Pt with some increase in pain today with use of resistance under water.  She did not tolerate L hip abdct, nor L hip ext as this irritated her back. She reported some relief with cycling  in deepest lane in lap pool. Plan to issue laminated HEP next pool visit; anticipate d/c at end of cert.  Pt voiced interest in trying to return to swimming; may trial in lap pool next visit. Temp of lap pool was well tolerated.    Therapist on land to assess tolerance for HEP and finalize.      OBJECTIVE IMPAIRMENTS: Abnormal gait, decreased activity tolerance, decreased balance, impaired flexibility, and pain.   ACTIVITY LIMITATIONS: sleeping, stairs, transfers, and locomotion level  PARTICIPATION LIMITATIONS: shopping and community activity  PERSONAL FACTORS: Fitness are also affecting patient's functional outcome.   REHAB POTENTIAL: Good  CLINICAL DECISION MAKING: Evolving/moderate complexity  EVALUATION COMPLEXITY: Moderate   GOALS: Goals reviewed with patient? Yes  SHORT TERM GOALS: Target date: 07/07/23  Pt will tolerate full aquatic sessions consistently without increase in pain and with improving function to demonstrate good toleration and effectiveness of intervention.  Baseline: 07/10/23: reports improving tolerance w/ aquatics Goal status: MET  2. Pt will report walking x 15 minutes 5/7 days a week to demonstrate committment to increasing her physical activity Baseline: walking 30 min (on walking pad)  Goal status: MET - 07/17/23  3.  Pt will report a reduction of LBP by at least 25% Baseline: see chart 07/10/23: continues to endorse fluctuations 07/25/23: 40% Goal status: Met 07/25/23   LONG TERM GOALS: Target date: 09/14/23  Pt to improve on ODI by  20 %  to demonstrate statistically significant Improvement in function. Baseline: 33% Goal status: INITIAL  2.  Pt will be indep with final HEP's (land and aquatic as appropriate) for continued management of condition Baseline:  Goal status: INITIAL  3.  Pt will report decrease in pain by at least 75% for improved toleration to activity/quality of life and to demonstrate improved management of pain.  Baseline: see  chart; ~40% 07/31/23 Goal status: In progress  4.  Pt will improve strength in LE up towards 10lbs to demonstrate improved overall physical function Baseline: see chart Goal status: In progress 07/31/23  5.  Pt will improve on 30s STS test to <or= 8 to demonstrate improving functional lower extremity strength, transitional movements, and balance Baseline: 4 on eval ; 5 07/31/23 ; 6.5  on 5/13 Goal status: In progress  6.  Pt will improve on Tug test to <or=13s (norm) to demonstrate improvement in lower extremity function, mobility and decreased fall risk. Baseline: 14.59; 13.51 (07/31/23) ; 8.66 (09/04/23) Goal status:  GOAL MET  5/13  PLAN:  PT FREQUENCY: 2x/week  PT DURATION: 6 weeks  PLANNED INTERVENTIONS: 97164- PT Re-evaluation, 97110-Therapeutic exercises, 97530- Therapeutic activity, 97112- Neuromuscular re-education, 97535- Self Care, 65784- Manual therapy, [phone]- Gait training, [phone]- Orthotic Fit/training, [phone]- Aquatic Therapy, [phone]- Ionotophoresis 4mg /ml Dexamethasone , Patient/Family education, Balance training, Stair training, Taping, Dry Needling, Joint mobilization, Cryotherapy, and Moist heat.  PLAN FOR NEXT SESSION: aquatic sessions for pain management and to initiate muscle lengthening/strengthening and balance then incorporate land based intervention for added load bearing strengthening and final HEP for continued management of chronic condition  Almedia Jacobsen, PTA 09/06/23 4:26 PM [facility] [address] [place], [place], [address] Phone: [phone]   Fax:  [phone]

## 2023-09-10 ENCOUNTER — Encounter (HOSPITAL_BASED_OUTPATIENT_CLINIC_OR_DEPARTMENT_OTHER): Admitting: Physical Therapy

## 2023-09-10 DIAGNOSIS — H04123 Dry eye syndrome of bilateral lacrimal glands: Secondary | ICD-10-CM | POA: Diagnosis not present

## 2023-09-10 DIAGNOSIS — E119 Type 2 diabetes mellitus without complications: Secondary | ICD-10-CM | POA: Diagnosis not present

## 2023-09-10 DIAGNOSIS — H26493 Other secondary cataract, bilateral: Secondary | ICD-10-CM | POA: Diagnosis not present

## 2023-09-10 DIAGNOSIS — Z961 Presence of intraocular lens: Secondary | ICD-10-CM | POA: Diagnosis not present

## 2023-09-10 LAB — HM DIABETES EYE EXAM

## 2023-09-11 ENCOUNTER — Ambulatory Visit (INDEPENDENT_AMBULATORY_CARE_PROVIDER_SITE_OTHER): Admitting: "Endocrinology

## 2023-09-11 ENCOUNTER — Encounter: Payer: Self-pay | Admitting: "Endocrinology

## 2023-09-11 VITALS — BP 130/80 | HR 87 | Ht 68.0 in | Wt 226.0 lb

## 2023-09-11 DIAGNOSIS — E041 Nontoxic single thyroid nodule: Secondary | ICD-10-CM | POA: Diagnosis not present

## 2023-09-11 NOTE — Progress Notes (Signed)
 Outpatient Endocrinology Note Jorge Newcomer, MD  09/11/23   Jennifer [name] 02/11/1951 [id number]  Referring Provider: Ezell Hollow, MD Primary Care Provider: Ezell Hollow, MD Subjective  No chief complaint on file.   Assessment & Plan  Diagnoses and all orders for this visit:  Thyroid  nodule -     TSH + free T4     Jennifer [name] is currently taking no thyroid  medication. 07/24/23 baseline thyroid  labs TSH, FT4, FT3 WNL  History of many nodular goiter, last ultrasound in 05/2020 reported stable thyroid  nodule 07/2023 follow-up thyroid  ultrasound: Nodule 1: 2.5 x 2.1 x 1.9 cm left mid thyroid  nodule is unchanged in size since prior examination where it measured 2.3 x 2.0 x 2.3 cm. The nodule was biopsied in 2014 but I do not see FNA results. Given pt has no recall on cancer and there nodule is same size, will continue to monitor clinically. Will continue to try chase the results as well. It was reported "benign" in reference made in 2022 thyroid  U/S report.   I have reviewed current medications, nurse's notes, allergies, vital signs, past medical and surgical history, family medical history, and social history for this encounter. Counseled patient on symptoms, examination findings, lab findings, imaging results, treatment decisions and monitoring and prognosis. The patient understood the recommendations and agrees with the treatment plan. All questions regarding treatment plan were fully answered.   Return in about 1 year (around 09/10/2024) for visit + labs before next visit.   Jorge Newcomer, MD  09/11/23   I have reviewed current medications, nurse's notes, allergies, vital signs, past medical and surgical history, family medical history, and social history for this encounter. Counseled patient on symptoms, examination findings, lab findings, imaging results, treatment decisions and monitoring and prognosis. The patient understood the recommendations and agrees with the treatment  plan. All questions regarding treatment plan were fully answered.   History of Present Illness Jennifer [name] is a 73 y.o. year old female who presents to our clinic with thyroid  nodule diagnosed around 2015.    Symptoms suggestive of HYPOTHYROIDISM:  fatigue Yes weight gain No cold intolerance  Yes constipation  Yes-has IBS  Symptoms suggestive of HYPERTHYROIDISM:  weight loss  Yes, on mounjaro  heat intolerance No hyperdefecation  No palpitations  No  Compressive symptoms:  dysphagia  No, gets esophagus dilated q73mo due to esophageal stricture dysphonia  No positional dyspnea (especially with simultaneous arms elevation)  No  Smokes  No On biotin  No Personal history of head/neck surgery/irradiation  Yes< history of brain surgery in s at [facility] due to arnold chiari malformation   Physical Exam  BP 130/80   Pulse 87   Ht 5\' 8"  (1.727 m)   Wt 226 lb (102.5 kg)   SpO2 96%   BMI 34.36 kg/m  Constitutional: well developed, well nourished Head: normocephalic, atraumatic, no exophthalmos Eyes: sclera anicteric, no redness Neck: + thyromegaly, - thyroid  tenderness; + nodules palpate Lungs: normal respiratory effort Neurology: alert and oriented, - fine hand tremor Skin: dry, no appreciable rashes Musculoskeletal: no appreciable defects Psychiatric: normal mood and affect  Allergies Allergies  Allergen Reactions   Doxepin  Nausea And Vomiting and Other (See Comments)    "Feeling loopy"   Morphine And Codeine Anaphylaxis and Shortness Of Breath   Sulfonamide Derivatives Hives    Burn from inside out   Promethazine Hcl Other (See Comments)    Hallucinations    Current Medications Patient's Medications  New Prescriptions   No medications on file  Previous Medications   ACETAMINOPHEN  (TYLENOL ) 500 MG TABLET    Take 1 tablet (500 mg total) by mouth every 6 (six) hours as needed.   APOAEQUORIN (PREVAGEN) 10 MG CAPS    Take 10 mg by mouth daily. prevagen   CYCLOSPORINE   (RESTASIS ) 0.05 % OPHTHALMIC EMULSION    Place 1 drop into both eyes 2 (two) times daily.   ESCITALOPRAM  (LEXAPRO ) 10 MG TABLET    Take 1 tablet (10 mg total) by mouth daily.   HYDROCORTISONE  VALERATE CREAM (WESTCORT ) 0.2 %    Apply 1 Application topically daily as needed (itching).   HYOSCYAMINE  (LEVSIN) 0.125 MG TABLET    Take 1 tablet (0.125 mg total) by mouth every 4 (four) hours as needed.   MULTIPLE VITAMINS-MINERALS (MULTIVITAMIN WITH MINERALS) TABLET    Take 2 tablets by mouth daily. Vitafusion woman gummy   NEXIUM  40 MG CAPSULE    Take 1 capsule (40 mg total) by mouth daily before breakfast.   OLOPATADINE (PATADAY) 0.1 % OPHTHALMIC SOLUTION    Place 1 drop into the left eye 2 (two) times daily.   ROSUVASTATIN  (CRESTOR ) 10 MG TABLET    Take 1 tablet (10 mg total) by mouth at bedtime.   TEMAZEPAM  (RESTORIL ) 30 MG CAPSULE    Take 1 capsule (30 mg total) by mouth at bedtime as needed. for sleep   TIRZEPATIDE (MOUNJARO) 7.5 MG/0.5ML PEN    Inject 7.5 mg into the skin once a week.   TRIAMTERENE -HYDROCHLOROTHIAZIDE  (MAXZIDE -25) 37.5-25 MG TABLET    Take 0.5 tablets by mouth daily.   XARELTO  2.5 MG TABS TABLET    TAKE TWO TABLETS BY MOUTH DAILY  Modified Medications   No medications on file  Discontinued Medications   No medications on file    Past Medical History Past Medical History:  Diagnosis Date   Allergy    Anemia    Arthritis    Cataract    bil cataracts removed   Chronic chest pain    Chronic fatigue 02/03/2017   Clotting disorder (HCC)    Pulmonary Embolis 2010 & 2015   Colon polyps    inflamed partially ulcerated hyperplastic polyp   Diabetes (HCC) 09/24/2013   no meds   Diverticulosis    Dysphagia esophageal phase - chronic after 3 fundoplications 01/20/2014   Edema of both lower extremities    Esophageal stenosis    Stricture after fundoplication REDO   Fibromyalgia    GERD (gastroesophageal reflux disease)    Glaucoma suspect    Heart murmur    no problems     Hemorrhoids, internal, with bleeding 06/27/2017   History of hiatal hernia    Hypertension    IBS (irritable bowel syndrome)    Lactose intolerance    Mitral valve prolapse    OSA (obstructive sleep apnea)    on CPAP-Mild - Sleep study 2004   Pre-diabetes    Pulmonary embolus (HCC)    2010 and 12-2013   Pulmonary nodule 12/13/2022   seen on CT chest xray on 12/16/22 - left upper lobe   Sleep apnea    wears CPAP   Thyroid  nodule      dr. watching    Tinnitus    Subjective   Zoster 2014   R flank    Past Surgical History Past Surgical History:  Procedure Laterality Date   ABDOMINAL HYSTERECTOMY     still has 1 ovary    APPENDECTOMY  BRAIN SURGERY  1995   Stem surgery, Willey Harrier Chiari Malformation   CATARACT EXTRACTION Bilateral 2017   cataracts, Dr. Lasandra Points   CHOLECYSTECTOMY     COLONOSCOPY     ESOPHAGEAL DILATION N/A 02/06/2023   Procedure: ESOPHAGEAL DILATION;  Surgeon: Hilarie Lovely, MD;  Location: [facility] OR;  Service: Thoracic;  Laterality: N/A;   ESOPHAGEAL DILATION N/A 02/13/2023   Procedure: ESOPHAGEAL DILATION;  Surgeon: Hilarie Lovely, MD;  Location: [facility] OR;  Service: Thoracic;  Laterality: N/A;   ESOPHAGEAL DILATION N/A 03/12/2023   Procedure: ESOPHAGEAL DILATION;  Surgeon: Hilarie Lovely, MD;  Location: [facility] OR;  Service: Thoracic;  Laterality: N/A;   ESOPHAGEAL MANOMETRY N/A 08/06/2019   Procedure: ESOPHAGEAL MANOMETRY (EM);  Surgeon: Kenney Peacemaker, MD;  Location: [facility];  Service: Endoscopy;  Laterality: N/A;   ESOPHAGOGASTRODUODENOSCOPY N/A 02/06/2023   Procedure: ESOPHAGOGASTRODUODENOSCOPY (EGD);  Surgeon: Hilarie Lovely, MD;  Location: [facility] OR;  Service: Thoracic;  Laterality: N/A;  With Savary dilation   ESOPHAGOGASTRODUODENOSCOPY N/A 02/13/2023   Procedure: ESOPHAGOGASTRODUODENOSCOPY (EGD);  Surgeon: Hilarie Lovely, MD;  Location[facility] OR;  Service: Thoracic;  Laterality: N/A;   ESOPHAGOGASTRODUODENOSCOPY N/A 03/12/2023    Procedure: ESOPHAGOGASTRODUODENOSCOPY (EGD);  Surgeon: Hilarie Lovely, MD;  Location: [facility] OR;  Service: Thoracic;  Laterality: N/A;   HEMORRHOID BANDING     HERNIA REPAIR     Hital Hernia   JOINT REPLACEMENT Right    knee   KNEE ARTHROSCOPY  05/16/2012   Procedure: ARTHROSCOPY KNEE;  Surgeon: Jonna Netter, MD;  Location: [facility] OR;  Service: Orthopedics;  Laterality: Left;   KNEE ARTHROSCOPY Right 01/2018   NISSEN FUNDOPLICATION     X 3 lab, open x 2 last with mesh   POLYPECTOMY     REDUCTION MAMMAPLASTY Bilateral 2007   TOTAL KNEE ARTHROPLASTY Right 08/25/2019   Procedure: RIGHT TOTAL KNEE ARTHROPLASTY;  Surgeon: Liliane Rei, MD;  Location: [facility] ORS;  Service: Orthopedics;  Laterality: Right;    UPPER GASTROINTESTINAL ENDOSCOPY      Family History family history includes Alcoholism in her father; Arthritis in her sister; CAD in her sister; Colon cancer in her maternal grandmother; Crohn's disease in her maternal aunt; Diabetes in her mother; Fibromyalgia in her sister; Heart disease in her father; Hypertension in her father, mother, and sister; Lung cancer in her maternal uncle; Rheum arthritis in her mother, sister, sister, and sister; Stomach cancer in her maternal uncle; Sudden death in her father.  Social History Social History   Socioeconomic History   Marital status: Married    Spouse name: Horace   Number of children: 1   Years of education: Not on file   Highest education level: Associate degree: occupational, Scientist, product/process development, or vocational program  Occupational History   Occupation: retired form the [employer] 04-2015    Employer: [employer]  Tobacco Use   Smoking status: Never   Smokeless tobacco: Never   Tobacco comments:    never used tobacco  Vaping Use   Vaping status: Never Used  Substance and Sexual Activity   Alcohol use: No    Alcohol/week: 0.0 standard drinks of alcohol   Drug use: No   Sexual activity: Not Currently    Birth control/protection:  Surgical    Comment: Hysterectomy  Other Topics Concern   Not on file  Social History Narrative   [employer] Graduate - Technical school   Married '70 for 2 years, divorced; re-married '86 for 2.5 years, divorced; re-married '90  for 1 year, divorced; re-married '00   1 son - '23   Sister - Died @ 29 [place]       Social Drivers of Corporate investment banker Strain: Low Risk  (09/05/2023)   Overall Financial Resource Strain (CARDIA)    Difficulty of Paying Living Expenses: Not hard at all  Food Insecurity: No Food Insecurity (09/05/2023)   Hunger Vital Sign    Worried About Running Out of Food in the Last Year: Never true    Ran Out of Food in the Last Year: Never true  Transportation Needs: No Transportation Needs (09/05/2023)   PRAPARE - Administrator, Civil Service (Medical): No    Lack of Transportation (Non-Medical): No  Physical Activity: Insufficiently Active (09/05/2023)   Exercise Vital Sign    Days of Exercise per Week: 2 days    Minutes of Exercise per Session: 20 min  Stress: No Stress Concern Present (09/05/2023)   [employer] of Occupational Health - Occupational Stress Questionnaire    Feeling of Stress : Not at all  Social Connections: Socially Integrated (09/05/2023)   Social Connection and Isolation Panel [NHANES]    Frequency of Communication with Friends and Family: More than three times a week    Frequency of Social Gatherings with Friends and Family: Once a week    Attends Religious Services: More than 4 times per year    Active Member of [employer] or Organizations: Yes    Attends Banker Meetings: Never    Marital Status: Married  Catering manager Violence: Not At Risk (09/05/2023)   Humiliation, Afraid, Rape, and Kick questionnaire    Fear of Current or Ex-Partner: No    Emotionally Abused: No    Physically Abused: No    Sexually Abused: No    Laboratory Investigations Lab Results  Component Value Date   TSH 1.82 07/24/2023    TSH 4.110 12/12/2022   TSH 2.85 04/26/2021   FREET4 1.3 07/24/2023   FREET4 0.71 05/25/2016   FREET4 0.86 04/29/2014     No results found for: "TSI"   No components found for: "TRAB"   Lab Results  Component Value Date   CHOL 115 06/08/2023   Lab Results  Component Value Date   HDL 67 06/08/2023   Lab Results  Component Value Date   LDLCALC 34 06/08/2023   Lab Results  Component Value Date   TRIG 64 06/08/2023   Lab Results  Component Value Date   CHOLHDL 1.7 06/08/2023   Lab Results  Component Value Date   CREATININE 0.98 06/18/2023   Lab Results  Component Value Date   GFR 58.20 (L) 06/05/2022      Component Value Date/Time   NA 142 06/18/2023 1437   NA 143 12/12/2022 1100   NA 143 02/16/2017 1450   NA 142 01/19/2016 1141   K 4.1 06/18/2023 1437   K 3.7 02/16/2017 1450   K 3.7 01/19/2016 1141   CL 104 06/18/2023 1437   CL 105 02/16/2017 1450   CO2 31 06/18/2023 1437   CO2 32 02/16/2017 1450   CO2 27 01/19/2016 1141   GLUCOSE 85 06/18/2023 1437   GLUCOSE 88 02/16/2017 1450   BUN 20 06/18/2023 1437   BUN 17 12/12/2022 1100   BUN 14 02/16/2017 1450   BUN 19.5 01/19/2016 1141   CREATININE 0.98 06/18/2023 1437   CREATININE 1.2 02/16/2017 1450   CREATININE 0.9 01/19/2016 1141   CALCIUM  9.5 06/18/2023 1437  CALCIUM  8.8 02/16/2017 1450   CALCIUM  8.8 01/19/2016 1141   PROT 7.2 06/18/2023 1437   PROT 7.1 12/12/2022 1100   PROT 7.3 02/16/2017 1450   PROT 7.3 01/19/2016 1141   ALBUMIN 4.1 06/18/2023 1437   ALBUMIN 4.3 12/12/2022 1100   ALBUMIN 3.6 01/19/2016 1141   AST 26 06/18/2023 1437   AST 21 01/19/2016 1141   ALT 32 06/18/2023 1437   ALT 27 02/16/2017 1450   ALT 18 01/19/2016 1141   ALKPHOS 51 06/18/2023 1437   ALKPHOS 57 02/16/2017 1450   ALKPHOS 59 01/19/2016 1141   BILITOT 0.4 06/18/2023 1437   BILITOT 0.31 01/19/2016 1141   GFRNONAA >60 06/18/2023 1437   GFRAA >60 12/25/2019 1353      Latest Ref Rng & Units 06/18/2023    2:37 PM  03/16/2023    2:54 PM 03/12/2023    7:21 AM  BMP  Glucose 70 - 99 mg/dL 85  425  956   BUN 8 - 23 mg/dL 20  20  14    Creatinine 0.44 - 1.00 mg/dL 3.87  5.64  3.32   Sodium 135 - 145 mmol/L 142  144  138   Potassium 3.5 - 5.1 mmol/L 4.1  4.1  3.8   Chloride 98 - 111 mmol/L 104  105  105   CO2 22 - 32 mmol/L 31  32  24   Calcium  8.9 - 10.3 mg/dL 9.5  9.9  8.5        Component Value Date/Time   WBC 4.5 06/18/2023 1437   WBC 4.5 03/12/2023 0721   RBC 4.24 06/18/2023 1437   HGB 12.1 06/18/2023 1437   HGB 12.1 02/16/2017 1450   HCT 37.1 06/18/2023 1437   HCT 36.6 02/16/2017 1450   PLT 209 06/18/2023 1437   PLT 239 02/16/2017 1450   MCV 87.5 06/18/2023 1437   MCV 87 02/16/2017 1450   MCH 28.5 06/18/2023 1437   MCHC 32.6 06/18/2023 1437   RDW 15.0 06/18/2023 1437   RDW 15.4 02/16/2017 1450   LYMPHSABS 1.9 06/18/2023 1437   LYMPHSABS 1.9 02/16/2017 1450   MONOABS 0.6 06/18/2023 1437   EOSABS 0.1 06/18/2023 1437   EOSABS 0.2 02/16/2017 1450   BASOSABS 0.0 06/18/2023 1437   BASOSABS 0.0 02/16/2017 1450      Parts of this note may have been dictated using voice recognition software. There may be variances in spelling and vocabulary which are unintentional. Not all errors are proofread. Please notify the [facility] if any discrepancies are noted or if the meaning of any statement is not clear.

## 2023-09-12 DIAGNOSIS — H04123 Dry eye syndrome of bilateral lacrimal glands: Secondary | ICD-10-CM | POA: Diagnosis not present

## 2023-09-12 DIAGNOSIS — H26493 Other secondary cataract, bilateral: Secondary | ICD-10-CM | POA: Diagnosis not present

## 2023-09-12 DIAGNOSIS — Z961 Presence of intraocular lens: Secondary | ICD-10-CM | POA: Diagnosis not present

## 2023-09-12 DIAGNOSIS — H35371 Puckering of macula, right eye: Secondary | ICD-10-CM | POA: Diagnosis not present

## 2023-09-12 DIAGNOSIS — H15013 Anterior scleritis, bilateral: Secondary | ICD-10-CM | POA: Diagnosis not present

## 2023-09-13 ENCOUNTER — Encounter (HOSPITAL_BASED_OUTPATIENT_CLINIC_OR_DEPARTMENT_OTHER): Payer: Self-pay | Admitting: Physical Therapy

## 2023-09-13 ENCOUNTER — Ambulatory Visit (HOSPITAL_BASED_OUTPATIENT_CLINIC_OR_DEPARTMENT_OTHER): Admitting: Physical Therapy

## 2023-09-13 DIAGNOSIS — R2689 Other abnormalities of gait and mobility: Secondary | ICD-10-CM

## 2023-09-13 DIAGNOSIS — M6281 Muscle weakness (generalized): Secondary | ICD-10-CM | POA: Diagnosis not present

## 2023-09-13 DIAGNOSIS — M5459 Other low back pain: Secondary | ICD-10-CM

## 2023-09-13 NOTE — Therapy (Signed)
 OUTPATIENT PHYSICAL THERAPY THORACOLUMBAR TREATMENT   Patient Name: Jennifer [name] MRN: [id number] DOB:1950/05/14, 73 y.o., female Today's Date: 09/13/2023  END OF SESSION:  PT End of Session - 09/13/23 1615     Visit Number 17    Date for PT Re-Evaluation 09/14/23    Authorization Type medicare    PT Start Time 1615    PT Stop Time 1653    PT Time Calculation (min) 38 min    Behavior During Therapy [facility] for tasks assessed/performed               Past Medical History:  Diagnosis Date   Allergy    Anemia    Arthritis    Cataract    bil cataracts removed   Chronic chest pain    Chronic fatigue 02/03/2017   Clotting disorder [facility])    Pulmonary Embolis 2010 & 2015   Colon polyps    inflamed partially ulcerated hyperplastic polyp   Diabetes (HCC) 09/24/2013   no meds   Diverticulosis    Dysphagia esophageal phase - chronic after 3 fundoplications 01/20/2014   Edema of both lower extremities    Esophageal stenosis    Stricture after fundoplication REDO   Fibromyalgia    GERD (gastroesophageal reflux disease)    Glaucoma suspect    Heart murmur    no problems    Hemorrhoids, internal, with bleeding 06/27/2017   History of hiatal hernia    Hypertension    IBS (irritable bowel syndrome)    Lactose intolerance    Mitral valve prolapse    OSA (obstructive sleep apnea)    on CPAP-Mild - Sleep study 2004   Pre-diabetes    Pulmonary embolus (HCC)    2010 and 12-2013   Pulmonary nodule 12/13/2022   seen on CT chest xray on 12/16/22 - left upper lobe   Sleep apnea    wears CPAP   Thyroid  nodule      dr. watching    Tinnitus    Subjective   Zoster 2014   R flank   Past Surgical History:  Procedure Laterality Date   ABDOMINAL HYSTERECTOMY     still has 1 ovary    APPENDECTOMY     BRAIN SURGERY  1995   Stem surgery, Arnold Chiari Malformation   CATARACT EXTRACTION Bilateral 2017   cataracts, Dr. Lasandra Points   CHOLECYSTECTOMY     COLONOSCOPY     ESOPHAGEAL  DILATION N/A 02/06/2023   Procedure: ESOPHAGEAL DILATION;  Surgeon: Hilarie Lovely, MD;  Location: [facility] OR;  Service: Thoracic;  Laterality: N/A;   ESOPHAGEAL DILATION N/A 02/13/2023   Procedure: ESOPHAGEAL DILATION;  Surgeon: Hilarie Lovely, MD;  Location: [facility] OR;  Service: Thoracic;  Laterality: N/A;   ESOPHAGEAL DILATION N/A 03/12/2023   Procedure: ESOPHAGEAL DILATION;  Surgeon: Hilarie Lovely, MD;  Location: [facility] OR;  Service: Thoracic;  Laterality: N/A;   ESOPHAGEAL MANOMETRY N/A 08/06/2019   Procedure: ESOPHAGEAL MANOMETRY (EM);  Surgeon: Kenney Peacemaker, MD;  Location: [facility] ENDOSCOPY;  Service: Endoscopy;  Laterality: N/A;   ESOPHAGOGASTRODUODENOSCOPY N/A 02/06/2023   Procedure: ESOPHAGOGASTRODUODENOSCOPY (EGD);  Surgeon: Hilarie Lovely, MD;  Location: [facility] OR;  Service: Thoracic;  Laterality: N/A;  With Savary dilation   ESOPHAGOGASTRODUODENOSCOPY N/A 02/13/2023   Procedure: ESOPHAGOGASTRODUODENOSCOPY (EGD);  Surgeon: Hilarie Lovely, MD;  Location: [facility] OR;  Service: Thoracic;  Laterality: N/A;   ESOPHAGOGASTRODUODENOSCOPY N/A 03/12/2023   Procedure: ESOPHAGOGASTRODUODENOSCOPY (EGD);  Surgeon: Hilarie Lovely, MD;  Location: [facility] OR;  Service: Thoracic;  Laterality: N/A;   HEMORRHOID BANDING     HERNIA REPAIR     Hital Hernia   JOINT REPLACEMENT Right    knee   KNEE ARTHROSCOPY  05/16/2012   Procedure: ARTHROSCOPY KNEE;  Surgeon: Jonna Netter, MD;  Location: [facility] OR;  Service: Orthopedics;  Laterality: Left;   KNEE ARTHROSCOPY Right 01/2018   NISSEN FUNDOPLICATION     X 3 lab, open x 2 last with mesh   POLYPECTOMY     REDUCTION MAMMAPLASTY Bilateral 2007   TOTAL KNEE ARTHROPLASTY Right 08/25/2019   Procedure: RIGHT TOTAL KNEE ARTHROPLASTY;  Surgeon: Liliane Rei, MD;  Location: [facility] ORS;  Service: Orthopedics;  Laterality: Right;    UPPER GASTROINTESTINAL ENDOSCOPY     Patient Active Problem List   Diagnosis Date Noted   Vitamin D  deficiency  12/12/2022   Decreased GFR 12/12/2022   Physically inactive 12/12/2022   Calculus of kidney 07/14/2021   OSA on CPAP 06/08/2021   Circadian rhythm sleep disorder, delayed sleep phase type 06/08/2021   Glaucoma suspect 06/25/2020   S/P total knee arthroplasty, right 08/27/2019   OA (osteoarthritis) of knee 08/25/2019   Primary osteoarthritis of right knee 08/25/2019   Atypical chest pain    Hemorrhoids, internal, with bleeding 06/27/2017   IDA (iron deficiency anemia) 02/20/2017   Chronic fatigue 02/03/2017   Left leg pain 11/29/2016   History of colonic polyps 12/02/2015   PCP NOTES >>>>>>>>>>>>>>>>>>>>>>>>> 02/13/2015   Anxiety 08/04/2014   Annual physical exam 04/29/2014   Fibromyalgia syndrome 02/04/2014   Dysphagia 01/20/2014   Chronic cough 01/08/2014   DOE (dyspnea on exertion) 12/26/2013   Diabetes (HCC) 09/24/2013   Abdominal pain, chronic, right flank -upper quadrant 07/02/2012   Class 2 severe obesity with serious comorbidity and body mass index (BMI) of 38.0 to 38.9 in adult [facility]) 09/30/2009   Hypersomnia with sleep apnea 09/28/2009   Essential hypertension 06/24/2009   DIZZINESS 06/24/2009   HEADACHE 06/24/2009   VENOUS INSUFFICIENCY, LEGS 11/19/2008   Acute pulmonary embolism (HCC) 05/19/2008   Pulmonary nodule 08/26/2007   Stricture and stenosis of esophagus 05/22/2007   GERD 01/31/2007   IBS (irritable bowel syndrome) 01/31/2007   MITRAL VALVE PROLAPSE, HX OF 01/31/2007    PCP: Devonna Foley MD  REFERRING PROVIDER: Mort Ards, MD   REFERRING DIAG: M54.50 (ICD-10-CM) - Low back pain   Rationale for Evaluation and Treatment: Rehabilitation  THERAPY DIAG:  Other low back pain  Other abnormalities of gait and mobility  Muscle weakness (generalized)  ONSET DATE: Last 2 yrs  SUBJECTIVE:  SUBJECTIVE STATEMENT: Pt reports she's having a pretty good day.  She is interested in continuing therapy, is open to trying land based exercises, even though it has been painful in the past.   POOL ACCESS: currently none, but  plans to get membership at [facility] at d/c.   PERTINENT HISTORY:   Lumbar spondylosis R TKR in 2021, L knee arthroscopy in 2014, DM type 2, Fibromyalgia   PAIN:  Are you having pain? Yes: NPRS scale: currently 3.5/10 Pain location:  central lumbar and bilat lumbar flanks Pain description: ache Aggravating factors: sitting >30 minutes, lying on left side or too long on back Relieving factors: heating pad, pain patches  PRECAUTIONS: None  RED FLAGS: None   WEIGHT BEARING RESTRICTIONS: No  FALLS:  Has patient fallen in last 6 months? No   OCCUPATION: retired; Clinical cytogeneticist  PLOF: Independent  PATIENT GOALS: improve strength, get back into exercise.  NEXT MD VISIT: as needed  OBJECTIVE:  Note: Objective measures were completed at Evaluation unless otherwise noted.  DIAGNOSTIC FINDINGS:  Imaging from today does demonstrate mild degenerative disc disease at L4-L5 and L5-S1   PATIENT SURVEYS:  Modified Oswestry 15/45   COGNITION: Overall cognitive status: Within functional limits for tasks assessed     SENSATION: WFL  MUSCLE LENGTH: Hamstrings: Bilateral hamstring    POSTURE: rounded shoulders, forward head, and left knee valgus  PALPATION: Center l4-5; L5-S1  LUMBAR ROM:  wfl  LOWER EXTREMITY ROM:     wfl  LOWER EXTREMITY MMT:    MMT Right eval Left eval R / L 4/8 R/L    Hip flexion 40.6 37.1 21.1 / 36.1   Hip extension      Hip abduction 27.4 26.7 25.9 / 20.4   Hip adduction      Hip internal rotation      Hip external rotation      Knee flexion      Knee extension 47.1 29.3 29.4/ 30.2   Ankle dorsiflexion      Ankle plantarflexion      Ankle inversion      Ankle eversion       (Blank rows = not  tested)  LUMBAR SPECIAL TESTS:  Slump test: Negative  FUNCTIONAL TESTS:  30 s STS 4 from pool bench with ue assist (norm 10-14 ) Timed up and go (TUG): 14.59 4 stage balance: passed 1&2; Tandem x 10s (unsteady); SLS x 6s     07/31/23   30s STS from pool bench: 5   TUG 13.51   4 stage balance: SLS: 14s    09/04/2023   30 sec STS test:  6.5 reps with UE support toward the end of the test TUG:  8.86 sec       GAIT: Distance walked: 446ft Assistive device utilized: None Level of assistance: Complete Independence Comments: Left knee valgus knocking right.  TREATMENT  [facility] Adult PT Treatment:                                                DATE: 09/13/23 Pt seen for aquatic therapy today.  Treatment took place in water 4.75 ft in depth at the [facility] pool. Temp of water was 86.  Pt entered/exited the pool via ladder with hand rail.   *unsupported: walking forward/ backward in 4+ ft   * side stepping with arm addct/  abdct  * marching forward/ backward * wall push up/off  * heel raises * staggered stance kick board row  *Farmers carry bilat rainbow HB marching forward, back *Straddled yellow noodle:  cycling  for relief of low back pain  09/04/23  Reviewed current function, pain level, response to prior Rx, and HEP compliance.   See above for 30s STS and TUG    Supine PPT 2x10 PT educated pt in correct performance and palpation of TrA contractions.  Pt performed supine TrA contractions with and without 5 sec hold.   Supine bridge with TrA 2x10 Supine alt UE/LE with TrA S/L hip abd x 10 reps bilat Supine manual HS stretch 2 reps x 20-30 sec bilat    [facility] Adult PT Treatment:                                                DATE: 08/31/23 Pt seen for aquatic therapy today.  Treatment took place in water 3.5-4.75 ft in depth at the [facility] pool. Temp of water was 91.  Pt entered/exited the pool via stairs with hand rail.   *unsupported: walking  forward/ backward in 4+ ft   *Farmers carry yellow HB bilaterally then unilaterally, marching forward, back *side stepping with rainbow hand float->yellow hand floats with shoulder add/abd -> sidetep into wide squat * Straddled yellow noodle,: cycling  for relief of low back pain *solid noodle pull down to thighs in wide stance x 5 -> hip hinge for stretch -> hollow long noodle pull down in staggered stance  x 5 each -> back to wide stance with hollow noodle * return to walking forward/ backward  * return to cycling while suspended on noodle   [facility] Adult PT Treatment:                                                DATE: 08/29/23 Pt seen for aquatic therapy today.  Treatment took place in water 3.5-4.75 ft in depth at the [facility] pool. Temp of water was 91.  Pt entered/exited the pool via stairs with hand rail.   *unsupported: walking forward/ backward/side ways in 4+ ft   *Farmers carry yellow HB bilaterally then unilaterally forward, back *side stepping with rbhb->yellow  shoulder add/abd x 2 widths-> side lunge x 4 widths *hip hinge; Verbal and tactile cues.  Good execution. Pausing in "L" stretch position for stretch * staggered stances with reciprocal arm swing with medium resistance bells;3 sets 8 slow/8 fast then wide 2 x 6 fast/slow *solid noodle puul down wide stance and staggered stance x 10 * Straddled yellow noodle,: cycling multiple laps  [facility] Adult PT Treatment:                                                DATE: 08/08/23 Pt seen for aquatic therapy today.  Treatment took place in water 3.5-4.75 ft in depth at the [facility] pool. Temp of water was 91.  Pt entered/exited the pool via stairs with hand rail.   *unsupported: walking forward/ backward in 4+ ft of water  4 laps,  *side stepping ue RBHB ue add/abd *Farmers carry yellow HB bilaterally then unilaterally forward, back *side stepping with rbhb  shoulder add/abd x 2 widths-> side lunge x 4  widths * wide stance with reciprocal arm swing with medium resistance bells;3 sets 6 slow/6 fast then staggered stances 3 x 5 fast/slow *solid noodle press wide stance then staggered stances x 10. Cues for core engagement * Straddled yellow noodle,: cycling multiple laps                   PATIENT EDUCATION:  Education details: aquatic therapy HEP  Person educated: Patient Education method: Explanation, Demonstration, Tactile cues, Verbal cues Education comprehension: verbalized understanding, returned demonstration, verbal cues required, tactile cues required, and needs further education     HOME EXERCISE PROGRAM: Access Code: [email or web address] URL: [email or web address] Date: 08/02/2023 Prepared by: Gellen April Erman [place]  Exercises - Heel Raises with Counter Support  - 2-3 x daily - 1 sets - 8-10 reps - Supine Bridge  - 1 x daily - 7 x weekly - 2 sets - 10 reps - Sidelying Hip Abduction  - 1 x daily - 7 x weekly - 2 sets - 10 reps - Dead Bug  - 1 x daily - 7 x weekly - 2 sets - 10 reps - Standing with Back Flat Against Wall  - 1 x daily - 7 x weekly - 2 sets - 10 reps  AQUATIC Access Code: [email or web address] URL: [email or web address] Date: 09/06/2023 Prepared by: [facility] This aquatic home exercise program from MedBridge utilizes pictures from land based exercises, but has been adapted prior to lamination and issuance.    ASSESSMENT:  CLINICAL IMPRESSION: Pt has made gradual progress towards established goals.  She has met her max potential in the water and was instructed on final aquatic HEP.    Therapist on land to reassess goals (for recert) and modify HEP to tolerance.      OBJECTIVE IMPAIRMENTS: Abnormal gait, decreased activity tolerance, decreased balance, impaired flexibility, and pain.   ACTIVITY LIMITATIONS: sleeping, stairs, transfers, and locomotion level  PARTICIPATION LIMITATIONS: shopping and community  activity  PERSONAL FACTORS: Fitness are also affecting patient's functional outcome.   REHAB POTENTIAL: Good  CLINICAL DECISION MAKING: Evolving/moderate complexity  EVALUATION COMPLEXITY: Moderate   GOALS: Goals reviewed with patient? Yes  SHORT TERM GOALS: Target date: 07/07/23  Pt will tolerate full aquatic sessions consistently without increase in pain and with improving function to demonstrate good toleration and effectiveness of intervention.  Baseline: 07/10/23: reports improving tolerance w/ aquatics Goal status: MET  2. Pt will report walking x 15 minutes 5/7 days a week to demonstrate committment to increasing her physical activity Baseline: walking 30 min (on walking pad)  Goal status: MET - 07/17/23  3.  Pt will report a reduction of LBP by at least 25% Baseline: see chart 07/10/23: continues to endorse fluctuations 07/25/23: 40% Goal status: Met 07/25/23   LONG TERM GOALS: Target date: 09/14/23  Pt to improve on ODI by  20 % to demonstrate statistically significant Improvement in function. Baseline: 33% Goal status: INITIAL  2.  Pt will be indep with final HEP's (land and aquatic as appropriate) for continued management of condition Baseline:  Goal status: INITIAL  3.  Pt will report decrease in pain by at least 75% for improved toleration to activity/quality of life and to demonstrate improved management of pain.  Baseline: see chart; ~40% 07/31/23 Goal status: In progress  4.  Pt will improve strength in LE up towards 10lbs to demonstrate improved overall physical function Baseline: see chart Goal status: In progress 07/31/23  5.  Pt will improve on 30s STS test to <or= 8 to demonstrate improving functional lower extremity strength, transitional movements, and balance Baseline: 4 on eval ; 5 07/31/23 ; 6.5  on 5/13 Goal status: In progress  6.  Pt will improve on Tug test to <or=13s (norm) to demonstrate improvement in lower extremity function, mobility and  decreased fall risk. Baseline: 14.59; 13.51 (07/31/23) ; 8.66 (09/04/23) Goal status:  GOAL MET  5/13  PLAN:  PT FREQUENCY: 2x/week  PT DURATION: 6 weeks  PLANNED INTERVENTIONS: 97164- PT Re-evaluation, 97110-Therapeutic exercises, 97530- Therapeutic activity, 97112- Neuromuscular re-education, 97535- Self Care, 96295- Manual therapy, [phone]- Gait training, [phone]- Orthotic Fit/training, [phone]- Aquatic Therapy, [phone]- Ionotophoresis 4mg /ml Dexamethasone , Patient/Family education, Balance training, Stair training, Taping, Dry Needling, Joint mobilization, Cryotherapy, and Moist heat.  PLAN FOR NEXT SESSION: aquatic sessions for pain management and to initiate muscle lengthening/strengthening and balance then incorporate land based intervention for added load bearing strengthening and final HEP for continued management of chronic condition  Almedia Jacobsen, PTA 09/13/23 6:01 PM [facility] [name] [place], [place], [address] Phone: [phone]   Fax:  [phone]

## 2023-09-19 ENCOUNTER — Encounter: Payer: Self-pay | Admitting: Family

## 2023-09-19 ENCOUNTER — Ambulatory Visit (INDEPENDENT_AMBULATORY_CARE_PROVIDER_SITE_OTHER): Admitting: Internal Medicine

## 2023-09-19 ENCOUNTER — Encounter (INDEPENDENT_AMBULATORY_CARE_PROVIDER_SITE_OTHER): Payer: Self-pay | Admitting: Internal Medicine

## 2023-09-19 DIAGNOSIS — G4733 Obstructive sleep apnea (adult) (pediatric): Secondary | ICD-10-CM | POA: Diagnosis not present

## 2023-09-19 DIAGNOSIS — Z7985 Long-term (current) use of injectable non-insulin antidiabetic drugs: Secondary | ICD-10-CM | POA: Diagnosis not present

## 2023-09-19 DIAGNOSIS — E66812 Obesity, class 2: Secondary | ICD-10-CM | POA: Diagnosis not present

## 2023-09-19 DIAGNOSIS — Z6833 Body mass index (BMI) 33.0-33.9, adult: Secondary | ICD-10-CM

## 2023-09-19 DIAGNOSIS — E1169 Type 2 diabetes mellitus with other specified complication: Secondary | ICD-10-CM | POA: Diagnosis not present

## 2023-09-19 MED ORDER — TIRZEPATIDE 7.5 MG/0.5ML ~~LOC~~ SOAJ: 7.5000 mg | SUBCUTANEOUS | 0 refills | Status: DC

## 2023-09-19 NOTE — Progress Notes (Signed)
 Office: [phone]  /  Fax: [phone]  Weight Summary And Biometrics  Vitals Temp: 98.4 F (36.9 C) BP: 124/77 Pulse Rate: 75 SpO2: 99 %   Anthropometric Measurements Height: 5\' 8"  (1.727 m) Weight: 222 lb (100.7 kg) BMI (Calculated): 33.76 Weight at Last Visit: 232lb Weight Lost Since Last Visit: 10lb Weight Gained Since Last Visit: 0lb Starting Weight: 256lb Total Weight Loss (lbs): 35 lb (15.9 kg) Peak Weight: 259lb   Body Composition  Body Fat %: 45.7 % Fat Mass (lbs): 101.8 lbs Muscle Mass (lbs): 114.8 lbs Total Body Water (lbs): 80.4 lbs Visceral Fat Rating : 14    RMR: 1771  Today's Visit #: 10  Starting Date: 12/12/22   Subjective   Chief Complaint: Obesity  Interval History Discussed the use of AI scribe software for clinical note transcription with the patient, who gave verbal consent to proceed.  History of Present Illness   Jennifer [name] is a 73 year old female who presents for medical weight management.  She has lost ten pounds since her last visit, contributing to a total weight loss of thirty-five pounds from a peak weight of two hundred fifty-nine pounds. She follows a twelve hundred calorie nutrition plan seventy to eighty percent of the time, tracks her calories, and focuses on consuming whole foods with adequate protein intake. She maintains hydration and does not skip meals. She consumes protein shakes and aims for thirty grams of protein per meal.  She exercises two days a week for about forty minutes, focusing on strength training and cardio exercises. She reports getting adequate sleep and does not experience high levels of stress.  She is currently on a dose of 7.5 mg of Mounjaro, which provides good appetite control, allowing her to feel full quicker and unable to finish meals. She does not experience nausea or constipation. However, she reports a local skin reaction at the injection site, similar to her reaction to mosquito  bites.  She experiences difficulty swallowing and a raspy throat, which she attributes to needing her throat dilated again. No symptoms of low blood sugar such as shaking or sweating. Her last A1c was 6.2, and she is on triamterene  and hydrochlorothiazide  for blood pressure management, with a recent reading of 124/77. She uses a CPAP machine for obstructive sleep apnea.       Challenges affecting patient progress: none.    Pharmacotherapy for weight management: She is currently taking Monjauro with diabetes as the primary indication with adequate clinical response  and experiencing the following side effects: some muscle loss recently .Aaron Aas   Assessment and Plan   Treatment Plan For Obesity:  Recommended Dietary Goals  Jennifer [name] is currently in the action stage of change. As such, her goal is to continue weight management plan. She has agreed to: continue current plan  Behavioral Health and Counseling  We discussed the following behavioral modification strategies today: increasing lean protein intake to established goals and avoiding skipping meals.  Additional education and resources provided today: None  Recommended Physical Activity Goals  Jennifer [name] has been advised to work up to 150 minutes of moderate intensity aerobic activity a week and strengthening exercises 2-3 times per week for cardiovascular health, weight loss maintenance and preservation of muscle mass.   She has agreed to :  Increase the intensity, frequency or duration of strengthening exercises   Pharmacotherapy  We discussed various medication options to help Jennifer [name] with her weight loss efforts and we both agreed to : adequate clinical response  to anti-obesity medication, continue current regimen and do not recommend further increases in GLP-1 due to decreases in muscle mass  Associated Conditions Impacted by Obesity Treatment  OSA on CPAP -     Tirzepatide; Inject 7.5 mg into the skin once a week.  Dispense: 2 mL; Refill:  0  Type 2 diabetes mellitus with other specified complication, without long-term current use of insulin  (HCC) -     Tirzepatide; Inject 7.5 mg into the skin once a week.  Dispense: 2 mL; Refill: 0  Class 2 severe obesity with serious comorbidity and body mass index (BMI) of 38.0 to 38.9 in adult, unspecified obesity type (HCC) -     Tirzepatide; Inject 7.5 mg into the skin once a week.  Dispense: 2 mL; Refill: 0     Assessment and Plan    Obesity Total weight loss of 14%.  Obesity management with Mounjaro is effective, resulting in a weight loss of approximately 35-40 pounds. The current dose of 7.5 mg provides good appetite control without significant side effects such as nausea or constipation. However, there is concern about muscle loss, with approximately 5 pounds of muscle lost recently, which is higher than desired. Emphasis on adequate protein intake and strength training is necessary to prevent further muscle loss. Discussed the importance of allowing the medication to reach room temperature before injection to reduce local skin reactions. If further muscle loss is detected, the Mounjaro dose may need adjustment to allow for increased caloric intake. - Continue Mounjaro at 7.5 mg. - Ensure medication is at room temperature before injection to reduce local skin reactions. - Increase protein intake to 30 grams per meal. - Engage in strength training exercises 2-3 times per week. - Monitor muscle mass and adjust Mounjaro dose if further muscle loss is detected. - Increase caloric intake to prevent excessive muscle loss.  OSA on CPAP On CPAP with reported good compliance. Continue PAP therapy. Losing 15% or more of body weight may improve AHI.  She has lost 14% of total body weight.  Continue GLP-1 therapy   Hypertension Hypertension is well-controlled with triamterene  and hydrochlorothiazide . Blood pressure is 124/77. Continued weight loss may necessitate monitoring for symptoms of  hypotension. - Continue triamterene  and hydrochlorothiazide . - Monitor for symptoms of hypotension such as dizziness or lightheadedness. - Ensure adequate hydration.  Type 2 Diabetes Mellitus Type 2 Diabetes Mellitus is well-controlled with an A1c of 6.2. No symptoms of hypoglycemia reported. - Recheck A1c at next visit. - Continue current dose of Mounjaro.     Objective   Physical Exam:  Blood pressure 124/77, pulse 75, temperature 98.4 F (36.9 C), height 5\' 8"  (1.727 m), weight 222 lb (100.7 kg), SpO2 99%. Body mass index is 33.75 kg/m.  General: She is overweight, cooperative, alert, well developed, and in no acute distress. PSYCH: Has normal mood, affect and thought process.   HEENT: EOMI, sclerae are anicteric. Lungs: Normal breathing effort, no conversational dyspnea. Extremities: No edema.  Neurologic: No gross sensory or motor deficits. No tremors or fasciculations noted.    Diagnostic Data Reviewed:  BMET    Component Value Date/Time   NA 142 06/18/2023 1437   NA 143 12/12/2022 1100   NA 143 02/16/2017 1450   NA 142 01/19/2016 1141   K 4.1 06/18/2023 1437   K 3.7 02/16/2017 1450   K 3.7 01/19/2016 1141   CL 104 06/18/2023 1437   CL 105 02/16/2017 1450   CO2 31 06/18/2023 1437   CO2 32  02/16/2017 1450   CO2 27 01/19/2016 1141   GLUCOSE 85 06/18/2023 1437   GLUCOSE 88 02/16/2017 1450   BUN 20 06/18/2023 1437   BUN 17 12/12/2022 1100   BUN 14 02/16/2017 1450   BUN 19.5 01/19/2016 1141   CREATININE 0.98 06/18/2023 1437   CREATININE 1.2 02/16/2017 1450   CREATININE 0.9 01/19/2016 1141   CALCIUM  9.5 06/18/2023 1437   CALCIUM  8.8 02/16/2017 1450   CALCIUM  8.8 01/19/2016 1141   GFRNONAA >60 06/18/2023 1437   GFRAA >60 12/25/2019 1353   Lab Results  Component Value Date   HGBA1C 6.2 (H) 06/08/2023   HGBA1C 6.2 (H) 10/14/2007   Lab Results  Component Value Date   INSULIN  20.4 12/12/2022   INSULIN  12.6 05/19/2021   Lab Results  Component Value  Date   TSH 1.82 07/24/2023   CBC    Component Value Date/Time   WBC 4.5 06/18/2023 1437   WBC 4.5 03/12/2023 0721   RBC 4.24 06/18/2023 1437   HGB 12.1 06/18/2023 1437   HGB 12.1 02/16/2017 1450   HCT 37.1 06/18/2023 1437   HCT 36.6 02/16/2017 1450   PLT 209 06/18/2023 1437   PLT 239 02/16/2017 1450   MCV 87.5 06/18/2023 1437   MCV 87 02/16/2017 1450   MCH 28.5 06/18/2023 1437   MCHC 32.6 06/18/2023 1437   RDW 15.0 06/18/2023 1437   RDW 15.4 02/16/2017 1450   Iron Studies    Component Value Date/Time   IRON 59 12/05/2021 1451   IRON 49 02/16/2017 1536   TIBC 323 12/05/2021 1451   TIBC 284 02/16/2017 1536   FERRITIN 51 12/05/2021 1452   FERRITIN 66 02/16/2017 1536   IRONPCTSAT 18 12/05/2021 1451   IRONPCTSAT 17 (L) 02/16/2017 1536   Lipid Panel     Component Value Date/Time   CHOL 115 06/08/2023 1422   TRIG 64 06/08/2023 1422   HDL 67 06/08/2023 1422   CHOLHDL 1.7 06/08/2023 1422   VLDL 9.8 06/05/2022 1320   LDLCALC 34 06/08/2023 1422   Hepatic Function Panel     Component Value Date/Time   PROT 7.2 06/18/2023 1437   PROT 7.1 12/12/2022 1100   PROT 7.3 02/16/2017 1450   PROT 7.3 01/19/2016 1141   ALBUMIN 4.1 06/18/2023 1437   ALBUMIN 4.3 12/12/2022 1100   ALBUMIN 3.6 01/19/2016 1141   AST 26 06/18/2023 1437   AST 21 01/19/2016 1141   ALT 32 06/18/2023 1437   ALT 27 02/16/2017 1450   ALT 18 01/19/2016 1141   ALKPHOS 51 06/18/2023 1437   ALKPHOS 57 02/16/2017 1450   ALKPHOS 59 01/19/2016 1141   BILITOT 0.4 06/18/2023 1437   BILITOT 0.31 01/19/2016 1141   BILIDIR 0.0 01/08/2012 1224   IBILI 0.2 12/30/2010 1515      Component Value Date/Time   TSH 1.82 07/24/2023 1436   Nutritional Lab Results  Component Value Date   VD25OH 61.5 12/12/2022   VD25OH 42.2 05/19/2021    Medications: Outpatient Encounter Medications as of 09/19/2023  Medication Sig   acetaminophen  (TYLENOL ) 500 MG tablet Take 1 tablet (500 mg total) by mouth every 6 (six) hours  as needed. (Patient taking differently: Take 500 mg by mouth every 6 (six) hours as needed for moderate pain (pain score 4-6), mild pain (pain score 1-3) or headache.)   Apoaequorin (PREVAGEN) 10 MG CAPS Take 10 mg by mouth daily. prevagen   cycloSPORINE  (RESTASIS ) 0.05 % ophthalmic emulsion Place 1 drop into both eyes 2 (two)  times daily.   escitalopram  (LEXAPRO ) 10 MG tablet Take 1 tablet (10 mg total) by mouth daily.   hydrocortisone  valerate cream (WESTCORT ) 0.2 % Apply 1 Application topically daily as needed (itching).   hyoscyamine  (LEVSIN) 0.125 MG tablet Take 1 tablet (0.125 mg total) by mouth every 4 (four) hours as needed.   Multiple Vitamins-Minerals (MULTIVITAMIN WITH MINERALS) tablet Take 2 tablets by mouth daily. Vitafusion woman gummy   NEXIUM  40 MG capsule Take 1 capsule (40 mg total) by mouth daily before breakfast.   olopatadine (PATADAY) 0.1 % ophthalmic solution Place 1 drop into the left eye 2 (two) times daily.   rosuvastatin  (CRESTOR ) 10 MG tablet Take 1 tablet (10 mg total) by mouth at bedtime.   temazepam  (RESTORIL ) 30 MG capsule Take 1 capsule (30 mg total) by mouth at bedtime as needed. for sleep   triamterene -hydrochlorothiazide  (MAXZIDE -25) 37.5-25 MG tablet Take 0.5 tablets by mouth daily.   XARELTO  2.5 MG TABS tablet TAKE TWO TABLETS BY MOUTH DAILY   [DISCONTINUED] tirzepatide (MOUNJARO) 7.5 MG/0.5ML Pen Inject 7.5 mg into the skin once a week.   tirzepatide (MOUNJARO) 7.5 MG/0.5ML Pen Inject 7.5 mg into the skin once a week.   No facility-administered encounter medications on file as of 09/19/2023.     Follow-Up   Return in about 4 weeks (around 10/17/2023) for For Weight Mangement with Dr. Allie Area.Aaron Aas She was informed of the importance of frequent follow up visits to maximize her success with intensive lifestyle modifications for her multiple health conditions.  Attestation Statement   Reviewed by clinician on day of visit: allergies, medications, problem list,  medical history, surgical history, family history, social history, and previous encounter notes.     Ladd Picker, MD

## 2023-09-25 ENCOUNTER — Encounter (HOSPITAL_BASED_OUTPATIENT_CLINIC_OR_DEPARTMENT_OTHER): Payer: Self-pay | Admitting: Physical Therapy

## 2023-09-25 ENCOUNTER — Ambulatory Visit (HOSPITAL_BASED_OUTPATIENT_CLINIC_OR_DEPARTMENT_OTHER): Payer: Self-pay | Attending: Orthopedic Surgery | Admitting: Physical Therapy

## 2023-09-25 DIAGNOSIS — M6281 Muscle weakness (generalized): Secondary | ICD-10-CM | POA: Insufficient documentation

## 2023-09-25 DIAGNOSIS — M5459 Other low back pain: Secondary | ICD-10-CM | POA: Diagnosis not present

## 2023-09-25 DIAGNOSIS — R2689 Other abnormalities of gait and mobility: Secondary | ICD-10-CM | POA: Diagnosis not present

## 2023-09-25 NOTE — Therapy (Signed)
 OUTPATIENT PHYSICAL THERAPY THORACOLUMBAR TREATMENT Progress Note Reporting Period 08/02/2023 to 09/25/2023  See note below for Objective Data and Assessment of Progress/Goals.       Patient Name: Jennifer [name] MRN: [id number] DOB:October 22, 1950, 73 y.o., female Today's Date: 09/26/2023  END OF SESSION:  PT End of Session - 09/25/23 1501     Visit Number 18    Date for PT Re-Evaluation 11/06/23    Authorization Type medicare    PT Start Time 1458    PT Stop Time 1540    PT Time Calculation (min) 42 min    Activity Tolerance Patient tolerated treatment well    Behavior During Therapy [facility] for tasks assessed/performed                Past Medical History:  Diagnosis Date   Allergy    Anemia    Arthritis    Cataract    bil cataracts removed   Chronic chest pain    Chronic fatigue 02/03/2017   Clotting disorder [facility])    Pulmonary [place] 2010 & 2015   Colon polyps    inflamed partially ulcerated hyperplastic polyp   Diabetes (HCC) 09/24/2013   no meds   Diverticulosis    Dysphagia esophageal phase - chronic after 3 fundoplications 01/20/2014   Edema of both lower extremities    Esophageal stenosis    Stricture after fundoplication REDO   Fibromyalgia    GERD (gastroesophageal reflux disease)    Glaucoma suspect    Heart murmur    no problems    Hemorrhoids, internal, with bleeding 06/27/2017   History of hiatal hernia    Hypertension    IBS (irritable bowel syndrome)    Lactose intolerance    Mitral valve prolapse    OSA (obstructive sleep apnea)    on CPAP-Mild - Sleep study 2004   Pre-diabetes    Pulmonary embolus (HCC)    2010 and 12-2013   Pulmonary nodule 12/13/2022   seen on CT chest xray on 12/16/22 - left upper lobe   Sleep apnea    wears CPAP   Thyroid  nodule      dr. watching    Tinnitus    Subjective   [place] 2014   R flank   Past Surgical History:  Procedure Laterality Date   ABDOMINAL HYSTERECTOMY     still has 1 ovary    APPENDECTOMY      BRAIN SURGERY  1995   Stem surgery, Arnold Chiari Malformation   CATARACT EXTRACTION Bilateral 2017   cataracts, Dr. Lasandra Points   CHOLECYSTECTOMY     COLONOSCOPY     ESOPHAGEAL DILATION N/A 02/06/2023   Procedure: ESOPHAGEAL DILATION;  Surgeon: Hilarie Lovely, MD;  Location: [facility] OR;  Service: Thoracic;  Laterality: N/A;   ESOPHAGEAL DILATION N/A 02/13/2023   Procedure: ESOPHAGEAL DILATION;  Surgeon: Hilarie Lovely, MD;  Location: [facility] OR;  Service: Thoracic;  Laterality: N/A;   ESOPHAGEAL DILATION N/A 03/12/2023   Procedure: ESOPHAGEAL DILATION;  Surgeon: Hilarie Lovely, MD;  Location: [facility] OR;  Service: Thoracic;  Laterality: N/A;   ESOPHAGEAL MANOMETRY N/A 08/06/2019   Procedure: ESOPHAGEAL MANOMETRY (EM);  Surgeon: Kenney Peacemaker, MD;  Location: [facility];  Service: Endoscopy;  Laterality: N/A;   ESOPHAGOGASTRODUODENOSCOPY N/A 02/06/2023   Procedure: ESOPHAGOGASTRODUODENOSCOPY (EGD);  Surgeon: Hilarie Lovely, MD;  Location: [facility] OR;  Service: Thoracic;  Laterality: N/A;  With Savary dilation   ESOPHAGOGASTRODUODENOSCOPY N/A 02/13/2023   Procedure: ESOPHAGOGASTRODUODENOSCOPY (EGD);  Surgeon: Starleen Eastern  O, MD;  Location: [facility] OR;  Service: Thoracic;  Laterality: N/A;   ESOPHAGOGASTRODUODENOSCOPY N/A 03/12/2023   Procedure: ESOPHAGOGASTRODUODENOSCOPY (EGD);  Surgeon: Hilarie Lovely, MD;  Location: [facility] OR;  Service: Thoracic;  Laterality: N/A;   HEMORRHOID BANDING     HERNIA REPAIR     Hital Hernia   JOINT REPLACEMENT Right    knee   KNEE ARTHROSCOPY  05/16/2012   Procedure: ARTHROSCOPY KNEE;  Surgeon: Jonna Netter, MD;  Location: [facility] OR;  Service: Orthopedics;  Laterality: Left;   KNEE ARTHROSCOPY Right 01/2018   NISSEN FUNDOPLICATION     X 3 lab, open x 2 last with mesh   POLYPECTOMY     REDUCTION MAMMAPLASTY Bilateral 2007   TOTAL KNEE ARTHROPLASTY Right 08/25/2019   Procedure: RIGHT TOTAL KNEE ARTHROPLASTY;  Surgeon: Liliane Rei, MD;  Location:  [facility] ORS;  Service: Orthopedics;  Laterality: Right;    UPPER GASTROINTESTINAL ENDOSCOPY     Patient Active Problem List   Diagnosis Date Noted   Vitamin D  deficiency 12/12/2022   Decreased GFR 12/12/2022   Physically inactive 12/12/2022   Calculus of kidney 07/14/2021   OSA on CPAP 06/08/2021   Circadian rhythm sleep disorder, delayed sleep phase type 06/08/2021   Glaucoma suspect 06/25/2020   S/P total knee arthroplasty, right 08/27/2019   OA (osteoarthritis) of knee 08/25/2019   Primary osteoarthritis of right knee 08/25/2019   Atypical chest pain    Hemorrhoids, internal, with bleeding 06/27/2017   IDA (iron deficiency anemia) 02/20/2017   Chronic fatigue 02/03/2017   Left leg pain 11/29/2016   History of colonic polyps 12/02/2015   PCP NOTES >>>>>>>>>>>>>>>>>>>>>>>>> 02/13/2015   Anxiety 08/04/2014   Annual physical exam 04/29/2014   Fibromyalgia syndrome 02/04/2014   Dysphagia 01/20/2014   Chronic cough 01/08/2014   DOE (dyspnea on exertion) 12/26/2013   Diabetes (HCC) 09/24/2013   Abdominal pain, chronic, right flank -upper quadrant 07/02/2012   Class 2 severe obesity with serious comorbidity and body mass index (BMI) of 38.0 to 38.9 in adult [facility]) 09/30/2009   Hypersomnia with sleep apnea 09/28/2009   Essential hypertension 06/24/2009   DIZZINESS 06/24/2009   HEADACHE 06/24/2009   VENOUS INSUFFICIENCY, LEGS 11/19/2008   Acute pulmonary embolism (HCC) 05/19/2008   Pulmonary nodule 08/26/2007   Stricture and stenosis of esophagus 05/22/2007   GERD 01/31/2007   IBS (irritable bowel syndrome) 01/31/2007   MITRAL VALVE PROLAPSE, HX OF 01/31/2007    PCP: Devonna Foley MD  REFERRING PROVIDER: Mort Ards, MD   REFERRING DIAG: M54.50 (ICD-10-CM) - Low back pain   Rationale for Evaluation and Treatment: Rehabilitation  THERAPY DIAG:  Other low back pain  Other abnormalities of gait and mobility  Muscle weakness (generalized)  ONSET DATE: Last 2  yrs  SUBJECTIVE:  SUBJECTIVE STATEMENT: Pt states she is a lot better.  Pt hasn't had the "major back attacks"  recently.  Pt is able to perform activities around her home with less pain.  Pt is able to lie on back without pain.  Her pain has improved with standing to perform household chores including cleaning and cooking.  Pt has improved tolerance with cleaning home.  Pt reports a 45% improvement in pain. Pt states her back is hurting today because she did a lot of yard work this weekend.  Pt has set an alarm to get up every 30 mins while she is on the computer.  Pt reports compliance with HEP. Pt states she has pain and difficulty with lifting objects.   Pt states she has finished aquatic therapy and has an aquatic program.   reports she's having a pretty good day.  She is interested in continuing therapy, is open to trying land based exercises, even though it has been painful in the past.   Pt plans to join [facility] and use the pool.    POOL ACCESS: currently none, but  plans to get membership at [employer] at d/c.   PERTINENT HISTORY:   Lumbar spondylosis R TKR in 2021, L knee arthroscopy in 2014, DM type 2, Fibromyalgia   PAIN:  Are you having pain? Yes: NPRS scale: currently 4.5/10 current, 5/10 worst, 1.5-2/10 best Pain location:  central lumbar and bilat lumbar flanks Pain description: ache Aggravating factors: sitting >30 minutes, lying on left side Relieving factors: heating pad, pain patches, tylenol   PRECAUTIONS: None  RED FLAGS: None   WEIGHT BEARING RESTRICTIONS: No  FALLS:  Has patient fallen in last 6 months? No   OCCUPATION: retired; Clinical cytogeneticist  PLOF: Independent  PATIENT GOALS: improve strength, get back into exercise.  NEXT MD VISIT: as needed  OBJECTIVE:  Note:  Objective measures were completed at Evaluation unless otherwise noted.  DIAGNOSTIC FINDINGS:  Imaging from today does demonstrate mild degenerative disc disease at L4-L5 and L5-S1   PATIENT SURVEYS:  Modified Oswestry 15/45   COGNITION: Overall cognitive status: Within functional limits for tasks assessed     SENSATION: WFL  MUSCLE LENGTH: Hamstrings: Bilateral hamstring    POSTURE: rounded shoulders, forward head, and left knee valgus  PALPATION: Center l4-5; L5-S1  LUMBAR ROM:  wfl  LOWER EXTREMITY ROM:     wfl  LOWER EXTREMITY MMT:    MMT Right eval Left eval R / L 4/8 Right 6/3   Left 6/3  Hip flexion 40.6 37.1 21.1 / 36.1 34.7 39.8  Hip extension       Hip abduction 27.4 26.7 25.9 / 20.4 29.7 32.8  Hip adduction       Hip internal rotation       Hip external rotation       Knee flexion       Knee extension 47.1 29.3 29.4/ 30.2 31.0 27.5  Ankle dorsiflexion       Ankle plantarflexion       Ankle inversion       Ankle eversion        (Blank rows = not tested)  LUMBAR SPECIAL TESTS:  Slump test: Negative  FUNCTIONAL TESTS:  30 s STS 4 from pool bench with ue assist (norm 10-14 ) Timed up and go (TUG): 14.59 4 stage balance: passed 1&2; Tandem x 10s (unsteady); SLS x 6s     07/31/23   30s STS from pool bench: 5   TUG 13.51  4 stage balance: SLS: 14s    09/04/2023   30 sec STS test:  6.5 reps with UE support toward the end of the test TUG:  8.86 sec  09/25/2023 30 sec sit to stand test:  6.5 reps without UE support except on the last rep       GAIT: Pt had no limp with gait.    TREATMENT   09/25/2023 Reviewed current function, pain levels,  Reviewed goals.        [facility] Adult PT Treatment:                                                DATE: 09/13/23 Pt seen for aquatic therapy today.  Treatment took place in water 4.75 ft in depth at the [facility] pool. Temp of water was 86.  Pt entered/exited the pool via ladder with hand  rail.   *unsupported: walking forward/ backward in 4+ ft   * side stepping with arm addct/ abdct  * marching forward/ backward * wall push up/off  * heel raises * staggered stance kick board row  *Farmers carry bilat rainbow HB marching forward, back *Straddled yellow noodle:  cycling  for relief of low back pain  09/04/23  Reviewed current function, pain level, response to prior Rx, and HEP compliance.   See above for 30s STS and TUG    Supine PPT 2x10 PT educated pt in correct performance and palpation of TrA contractions.  Pt performed supine TrA contractions with and without 5 sec hold.   Supine bridge with TrA 2x10 Supine alt UE/LE with TrA S/L hip abd x 10 reps bilat Supine manual HS stretch 2 reps x 20-30 sec bilat    [facility] Adult PT Treatment:                                                DATE: 08/31/23 Pt seen for aquatic therapy today.  Treatment took place in water 3.5-4.75 ft in depth at the [facility] pool. Temp of water was 91.  Pt entered/exited the pool via stairs with hand rail.   *unsupported: walking forward/ backward in 4+ ft   *Farmers carry yellow HB bilaterally then unilaterally, marching forward, back *side stepping with rainbow hand float->yellow hand floats with shoulder add/abd -> sidetep into wide squat * Straddled yellow noodle,: cycling  for relief of low back pain *solid noodle pull down to thighs in wide stance x 5 -> hip hinge for stretch -> hollow long noodle pull down in staggered stance  x 5 each -> back to wide stance with hollow noodle * return to walking forward/ backward  * return to cycling while suspended on noodle   [facility] Adult PT Treatment:                                                DATE: 08/29/23 Pt seen for aquatic therapy today.  Treatment took place in water 3.5-4.75 ft in depth at the [facility] pool. Temp of water was 91.  Pt entered/exited the pool via stairs with hand rail.   *  unsupported: walking  forward/ backward/side ways in 4+ ft   *Farmers carry yellow HB bilaterally then unilaterally forward, back *side stepping with rbhb->yellow  shoulder add/abd x 2 widths-> side lunge x 4 widths *hip hinge; Verbal and tactile cues.  Good execution. Pausing in "L" stretch position for stretch * staggered stances with reciprocal arm swing with medium resistance bells;3 sets 8 slow/8 fast then wide 2 x 6 fast/slow *solid noodle puul down wide stance and staggered stance x 10 * Straddled yellow noodle,: cycling multiple laps  [facility] Adult PT Treatment:                                                DATE: 08/08/23 Pt seen for aquatic therapy today.  Treatment took place in water 3.5-4.75 ft in depth at the [facility] pool. Temp of water was 91.  Pt entered/exited the pool via stairs with hand rail.   *unsupported: walking forward/ backward in 4+ ft of water 4 laps,  *side stepping ue RBHB ue add/abd *Farmers carry yellow HB bilaterally then unilaterally forward, back *side stepping with rbhb  shoulder add/abd x 2 widths-> side lunge x 4 widths * wide stance with reciprocal arm swing with medium resistance bells;3 sets 6 slow/6 fast then staggered stances 3 x 5 fast/slow *solid noodle press wide stance then staggered stances x 10. Cues for core engagement * Straddled yellow noodle,: cycling multiple laps                   PATIENT EDUCATION:  Education details: aquatic therapy HEP  Person educated: Patient Education method: Explanation, Demonstration, Tactile cues, Verbal cues Education comprehension: verbalized understanding, returned demonstration, verbal cues required, tactile cues required, and needs further education     HOME EXERCISE PROGRAM: Access Code: [email or web address] URL: [email or web address] Date: 08/02/2023 Prepared by: Gellen April Erman [place]  Exercises - Heel Raises with Counter Support  - 2-3 x daily - 1 sets - 8-10 reps - Supine Bridge  - 1 x daily - 7 x  weekly - 2 sets - 10 reps - Sidelying Hip Abduction  - 1 x daily - 7 x weekly - 2 sets - 10 reps - Dead Bug  - 1 x daily - 7 x weekly - 2 sets - 10 reps - Standing with Back Flat Against Wall  - 1 x daily - 7 x weekly - 2 sets - 10 reps  AQUATIC Access Code: [email or web address] URL: [email or web address] Date: 09/06/2023 Prepared by: [facility] [address] This aquatic home exercise program from [employer] utilizes pictures from land based exercises, but has been adapted prior to lamination and issuance.    ASSESSMENT:  CLINICAL IMPRESSION: Pt has made gradual progress towards established goals.  She has met her max potential in the water and was instructed on final aquatic HEP.    Therapist on land to reassess goals (for recert) and modify HEP to tolerance.      OBJECTIVE IMPAIRMENTS: Abnormal gait, decreased activity tolerance, decreased balance, impaired flexibility, and pain.   ACTIVITY LIMITATIONS: sleeping, stairs, transfers, and locomotion level  PARTICIPATION LIMITATIONS: shopping and community activity  PERSONAL FACTORS: Fitness are also affecting patient's functional outcome.   REHAB POTENTIAL: Good  CLINICAL DECISION MAKING: Evolving/moderate complexity  EVALUATION COMPLEXITY: Moderate   GOALS: Goals reviewed with patient? Yes  SHORT TERM GOALS: Target date:  07/07/23  Pt will tolerate full aquatic sessions consistently without increase in pain and with improving function to demonstrate good toleration and effectiveness of intervention.  Baseline: 07/10/23: reports improving tolerance w/ aquatics Goal status: MET  2. Pt will report walking x 15 minutes 5/7 days a week to demonstrate committment to increasing her physical activity Baseline: walking 30 min (on walking pad)  Goal status: MET - 07/17/23  3.  Pt will report a reduction of LBP by at least 25% Baseline: see chart 07/10/23: continues to endorse fluctuations 07/25/23: 40% Goal  status: Met 07/25/23   LONG TERM GOALS: Target date: 09/14/23  Pt to improve on ODI by  20 % to demonstrate statistically significant Improvement in function. Baseline: 33% Goal status: INITIAL  2.  Pt will be indep with final HEP's (land and aquatic as appropriate) for continued management of condition Baseline:  Goal status: INITIAL  3.  Pt will report decrease in pain by at least 75% for improved toleration to activity/quality of life and to demonstrate improved management of pain.  Baseline: 60% MET   09/25/23 Goal status: In progress  4.  Pt will improve strength in LE up towards 10lbs to demonstrate improved overall physical function Baseline: see chart Goal status: ONGOING  5.  Pt will improve on 30s STS test to <or= 8 to demonstrate improving functional lower extremity strength, transitional movements, and balance Baseline: 4 on eval ; 5 07/31/23 ; 6.5  on 5/13 and 6/3 Goal status: In progress  6.  Pt will improve on Tug test to <or=13s (norm) to demonstrate improvement in lower extremity function, mobility and decreased fall risk. Baseline: 14.59; 13.51 (07/31/23) ; 8.66 (09/04/23) Goal status:  GOAL MET  5/13  PLAN:  PT FREQUENCY: 1-2x/wk  PT DURATION: 4-6 weeks  PLANNED INTERVENTIONS: 95621- PT Re-evaluation, 97110-Therapeutic exercises, 97530- Therapeutic activity, 97112- Neuromuscular re-education, 97535- Self Care, 30865- Manual therapy, [phone]- Gait training, [phone]- Orthotic Fit/training, [phone]- Aquatic Therapy, [phone]- Ionotophoresis 4mg /ml Dexamethasone , Patient/Family education, Balance training, Stair training, Taping, Dry Needling, Joint mobilization, Cryotherapy, and Moist heat.  PLAN FOR NEXT SESSION: aquatic sessions for pain management and to initiate muscle lengthening/strengthening and balance then incorporate land based intervention for added load bearing strengthening and final HEP for continued management of chronic condition  Almedia Jacobsen,  PTA 09/26/23 8:04 PM [facility] [address] [place], [place], [address] Phone: [phone]   Fax:  [phone]

## 2023-10-01 ENCOUNTER — Telehealth: Payer: Self-pay | Admitting: Internal Medicine

## 2023-10-01 ENCOUNTER — Other Ambulatory Visit: Payer: Self-pay | Admitting: Hematology & Oncology

## 2023-10-01 ENCOUNTER — Other Ambulatory Visit: Payer: Self-pay | Admitting: Gastroenterology

## 2023-10-01 NOTE — Telephone Encounter (Signed)
 PDMP okay, Rx sent

## 2023-10-01 NOTE — Telephone Encounter (Signed)
 Requesting: temazepam  30mg   Contract: 06/13/22 UDS: 06/08/23 Last Visit: 08/17/23 Next Visit: 12/07/23 Last Refill: 06/06/23 #30 and 3RF   Please Advise

## 2023-10-02 ENCOUNTER — Other Ambulatory Visit (INDEPENDENT_AMBULATORY_CARE_PROVIDER_SITE_OTHER): Payer: Self-pay | Admitting: Internal Medicine

## 2023-10-02 ENCOUNTER — Encounter: Payer: Self-pay | Admitting: Family

## 2023-10-02 DIAGNOSIS — G4733 Obstructive sleep apnea (adult) (pediatric): Secondary | ICD-10-CM

## 2023-10-02 DIAGNOSIS — E1169 Type 2 diabetes mellitus with other specified complication: Secondary | ICD-10-CM

## 2023-10-04 ENCOUNTER — Encounter: Payer: Self-pay | Admitting: Internal Medicine

## 2023-10-05 ENCOUNTER — Ambulatory Visit (INDEPENDENT_AMBULATORY_CARE_PROVIDER_SITE_OTHER): Admitting: Internal Medicine

## 2023-10-05 ENCOUNTER — Encounter: Payer: Self-pay | Admitting: Internal Medicine

## 2023-10-05 VITALS — BP 108/62 | HR 85 | Temp 98.2°F | Resp 16 | Ht 68.0 in | Wt 227.5 lb

## 2023-10-05 DIAGNOSIS — F419 Anxiety disorder, unspecified: Secondary | ICD-10-CM | POA: Diagnosis not present

## 2023-10-05 DIAGNOSIS — R1030 Lower abdominal pain, unspecified: Secondary | ICD-10-CM

## 2023-10-05 DIAGNOSIS — R102 Pelvic and perineal pain: Secondary | ICD-10-CM | POA: Diagnosis not present

## 2023-10-05 NOTE — Progress Notes (Unsigned)
 Subjective:    Patient ID: Jennifer [name], female    DOB: 1950/06/25, 73 y.o.   MRN: [id number]  DOS:  10/05/2023 Type of visit - description: Acute  Having episodes of R lower abdominal pain on and off for a year. Intensity changes, from mild to severe at times. He does not happen every day.  Sometimes wake her up at night. Does not get better or worse with bowel movements, urination or eating.  Denies any LUTS.   Review of Systems See above   Past Medical History:  Diagnosis Date   Allergy    Anemia    Arthritis    Cataract    bil cataracts removed   Chronic chest pain    Chronic fatigue 02/03/2017   Clotting disorder [facility])    Pulmonary Embolis 2010 & 2015   Colon polyps    inflamed partially ulcerated hyperplastic polyp   Diabetes (HCC) 09/24/2013   no meds   Diverticulosis    Dysphagia esophageal phase - chronic after 3 fundoplications 01/20/2014   Edema of both lower extremities    Esophageal stenosis    Stricture after fundoplication REDO   Fibromyalgia    GERD (gastroesophageal reflux disease)    Glaucoma suspect    Heart murmur    no problems    Hemorrhoids, internal, with bleeding 06/27/2017   History of hiatal hernia    Hypertension    IBS (irritable bowel syndrome)    Lactose intolerance    Mitral valve prolapse    OSA (obstructive sleep apnea)    on CPAP-Mild - Sleep study 2004   Pre-diabetes    Pulmonary embolus (HCC)    2010 and 12-2013   Pulmonary nodule 12/13/2022   seen on CT chest xray on 12/16/22 - left upper lobe   Sleep apnea    wears CPAP   Thyroid  nodule      dr. watching    Tinnitus    Subjective   Zoster 2014   R flank    Past Surgical History:  Procedure Laterality Date   ABDOMINAL HYSTERECTOMY     still has 1 ovary    APPENDECTOMY     BRAIN SURGERY  1995   Stem surgery, Arnold Chiari Malformation   CATARACT EXTRACTION Bilateral 2017   cataracts, Dr. Lasandra Points   CHOLECYSTECTOMY     COLONOSCOPY     ESOPHAGEAL DILATION N/A  02/06/2023   Procedure: ESOPHAGEAL DILATION;  Surgeon: Hilarie Lovely, MD;  [facility]: [facility];  Service: Thoracic;  Laterality: N/A;   ESOPHAGEAL DILATION N/A 02/13/2023   Procedure: ESOPHAGEAL DILATION;  Surgeon: Hilarie Lovely, MD;  Location: [facility] OR;  Service: Thoracic;  Laterality: N/A;   ESOPHAGEAL DILATION N/A 03/12/2023   Procedure: ESOPHAGEAL DILATION;  Surgeon: Hilarie Lovely, MD;  Location: [facility] OR;  Service: Thoracic;  Laterality: N/A;   ESOPHAGEAL MANOMETRY N/A 08/06/2019   Procedure: ESOPHAGEAL MANOMETRY (EM);  Surgeon: Kenney Peacemaker, MD;  Location: [facility] END[facility]COPY;  Service: Endoscopy;  Laterality: N/A;   ESOPHAGOGASTRODUODENOSCOPY N/A 02/06/2023   Procedure: ESOPHAGOGASTRODUODENOSCOPY (EGD);  Surgeon: Hilarie Lovely, MD;  Location: [facility] OR;  Service: Thoracic;  Laterality: N/A;  With Savary dilation   ESOPHAGOGASTRODUODENOSCOPY N/A 02/13/2023   Procedure: ESOPHAGOGASTRODUODENOSCOPY (EGD);  Surgeon: Hilarie Lovely, MD;  Location: [facility] OR;  Service: Thoracic;  Laterality: N/A;   ESOPHAGOGASTRODUODENOSCOPY N/A 03/12/2023   Procedure: ESOPHAGOGASTRODUODENOSCOPY (EGD);  Surgeon: Hilarie Lovely, MD;  Location: [facility] OR;  Service: Thoracic;  Laterality: N/A;  HEMORRHOID BANDING     HERNIA REPAIR     Hital Hernia   JOINT REPLACEMENT Right    knee   KNEE ARTHROSCOPY  05/16/2012   Procedure: ARTHROSCOPY KNEE;  Surgeon: Jonna Netter, MD;  Location: [facility] OR;  Service: Orthopedics;  Laterality: Left;   KNEE ARTHROSCOPY Right 01/2018   NISSEN FUNDOPLICATION     X 3 lab, open x 2 last with mesh   POLYPECTOMY     REDUCTION MAMMAPLASTY Bilateral 2007   TOTAL KNEE ARTHROPLASTY Right 08/25/2019   Procedure: RIGHT TOTAL KNEE ARTHROPLASTY;  Surgeon: Liliane Rei, MD;  Location: [facility] ORS;  Service: Orthopedics;  Laterality: Right;    UPPER GASTROINTESTINAL ENDOSCOPY      Current Outpatient Medications  Medication Instructions   acetaminophen  (TYLENOL ) 500 mg,  Oral, Every 6 hours PRN   cycloSPORINE  (RESTASIS ) 0.05 % ophthalmic emulsion 1 drop, 2 times daily   escitalopram  (LEXAPRO ) 10 mg, Oral, Daily   hydrocortisone  valerate cream (WESTCORT ) 0.2 % 1 Application, Daily PRN   hyoscyamine  (LEVSIN ) 0.125 MG tablet Take 1 tablet (0.125 mg total) by mouth every 4 hours as needed.   Multiple Vitamins-Minerals (MULTIVITAMIN WITH MINERALS) tablet 2 tablets, Daily   NexIUM  40 mg, Oral, Daily before breakfast   olopatadine (PATADAY) 0.1 % ophthalmic solution 1 drop, 2 times daily   Prevagen 10 mg, Daily   rosuvastatin  (CRESTOR ) 10 mg, Oral, Daily at bedtime   temazepam  (RESTORIL ) 30 MG capsule TAKE ONE CAPSULE BY MOUTH AT BEDTIME AS NEEDED F SLEEP   tirzepatide  (MOUNJARO ) 7.5 mg, Subcutaneous, Weekly   triamterene -hydrochlorothiazide  (MAXZIDE -25) 37.5-25 MG tablet 0.5 tablets, Oral, Daily   Xarelto  5 mg, Oral, Daily       Objective:   Physical Exam BP 108/62   Pulse 85   Temp 98.2 F (36.8 C) (Oral)   Resp 16   Ht 5' 8 (1.727 m)   Wt 227 lb 8 oz (103.2 kg)   SpO2 97%   BMI 34.59 kg/m  General:   Well developed, NAD, BMI noted.  HEENT:  Normocephalic . Face symmetric, atraumatic Abdomen:  Not distended, soft, slightly TTP at the RLQ distally without mass or rebound. No inguinal lymphadenopathies, no obvious inguinal hernia. Skin: Not pale. Not jaundice Lower extremities: no pretibial edema bilaterally  Neurologic:  alert & oriented X3.  Speech normal, gait appropriate for age and unassisted Psych--  Cognition and judgment appear intact.  Cooperative with normal attention span and concentration.  Behavior appropriate. No anxious or depressed appearing.     Assessment     Assessment   DM, + neuropathy  HTN  Anxiety, insomnia: On restoril  prn  Pulmonary: --OSA, sleep study 2004, on a CPAP --PEs:2010 and 12-2013 - saw hematology 12-2014, rec anticoag x 2 years (until 12-2015), then continue with a low-dose xarelto    life --  multiple pulmonary nodule, last CT 11-2013: No further imaging suggested --RAD, inhalers rarely Fibromyalgia CV:  MVP,  carotid US  1-39%s GI: --GERD,chronic dysphagia s/p  fundoplication 3, esophageal stenosis- - April 2021: Barium study, esophageal manometry (see report) -- IBS --Hemorrhoids: S/p banding ~/2019 -- chronic right flank,RUQ pain : since GB surgery 2008 Etiology not clear but likely multifactorial --> Adhesions from the surgery, neuropathy (DM), postherpetic neuralgia, scarring from PE, etc.  MRI T spine done 06-2014: DJD  Thyroid  nodule: Bx (non neoplastic goiter) 2014,  US  05-2016, 2022 (stable) Zoster --  2014 right flank   PLAN Right lower abdominal pain: Chronic, on chart review  she had the following: Appendectomy, hysterectomy (they only remove 1 ovary and tube), cholecystectomy, last colonoscopy 09/2020. Has not seen a gynecologist in many years.  She wonders about that. Plan: UA, urine culture, referred to gynecology.  If no GYN etiology is found, consider CT and/or GI referral. Anxiety, insomnia: See LOV, was Rx Lexapro .  She read that it interacts with Xarelto . Epic has not thrown  any alerts however I reviewed up-to-date and Lexapro  could increase effect of DOAC's.  Many other antidepressant will do the same.  It is recommended close monitoring for for sign -symptoms of bleeding.  With this in mind recommend to restart Lexapro  with close monitoring. RTC as scheduled for 11/2023

## 2023-10-06 LAB — URINALYSIS, ROUTINE W REFLEX MICROSCOPIC
Bilirubin Urine: NEGATIVE
Glucose, UA: NEGATIVE
Hgb urine dipstick: NEGATIVE
Ketones, ur: NEGATIVE
Leukocytes,Ua: NEGATIVE
Nitrite: NEGATIVE
Protein, ur: NEGATIVE
Specific Gravity, Urine: 1.019 (ref 1.001–1.035)
pH: 6 (ref 5.0–8.0)

## 2023-10-06 LAB — URINE CULTURE
MICRO NUMBER:: 16578134
SPECIMEN QUALITY:: ADEQUATE

## 2023-10-06 NOTE — Assessment & Plan Note (Signed)
 Right lower abdominal pain: Chronic, on chart review she had the following: Appendectomy, hysterectomy (they only remove 1 ovary and tube), cholecystectomy, last colonoscopy 09/2020. Has not seen a gynecologist in many years.  She wonders about that. Plan: UA, urine culture, referred to gynecology.  If no GYN etiology is found, consider CT and/or GI referral. Anxiety, insomnia: See LOV, was Rx Lexapro .  She read that it interacts with Xarelto . Epic has not thrown  any alerts however I reviewed up-to-date and Lexapro  could increase effect of DOAC's.  Many other antidepressant will do the same.  It is recommended close monitoring for for sign -symptoms of bleeding.  With this in mind recommend to restart Lexapro  with close monitoring. RTC as scheduled for 11/2023

## 2023-10-08 ENCOUNTER — Ambulatory Visit: Payer: Self-pay | Admitting: Internal Medicine

## 2023-10-08 ENCOUNTER — Ambulatory Visit (HOSPITAL_BASED_OUTPATIENT_CLINIC_OR_DEPARTMENT_OTHER): Payer: Self-pay

## 2023-10-08 ENCOUNTER — Encounter (HOSPITAL_BASED_OUTPATIENT_CLINIC_OR_DEPARTMENT_OTHER): Payer: Self-pay

## 2023-10-08 DIAGNOSIS — M6281 Muscle weakness (generalized): Secondary | ICD-10-CM | POA: Diagnosis not present

## 2023-10-08 DIAGNOSIS — R2689 Other abnormalities of gait and mobility: Secondary | ICD-10-CM

## 2023-10-08 DIAGNOSIS — M5459 Other low back pain: Secondary | ICD-10-CM

## 2023-10-08 NOTE — Therapy (Signed)
 OUTPATIENT PHYSICAL THERAPY THORACOLUMBAR TREATMENT Progress Note Reporting Period 08/02/2023 to 09/25/2023  See note below for Objective Data and Assessment of Progress/Goals.       Patient Name: Jennifer [name] MRN: [id number] DOB:01-25-1951, 73 y.o., female Today's Date: 10/08/2023  END OF SESSION:  PT End of Session - 10/08/23 1352     Visit Number 19    Number of Visits 22    Date for PT Re-Evaluation 11/06/23    Authorization Type medicare    Progress Note Due on Visit 20    PT Start Time 1146    PT Stop Time 1230    PT Time Calculation (min) 44 min    Activity Tolerance Patient tolerated treatment well    Behavior During Therapy [facility] for tasks assessed/performed              Past Medical History:  Diagnosis Date   Allergy    Anemia    Arthritis    Cataract    bil cataracts removed   Chronic chest pain    Chronic fatigue 02/03/2017   Clotting disorder (HCC)    Pulmonary Embolis 2010 & 2015   Colon polyps    inflamed partially ulcerated hyperplastic polyp   Diabetes (HCC) 09/24/2013   no meds   Diverticulosis    Dysphagia esophageal phase - chronic after 3 fundoplications 01/20/2014   Edema of both lower extremities    Esophageal stenosis    Stricture after fundoplication REDO   Fibromyalgia    GERD (gastroesophageal reflux disease)    Glaucoma suspect    Heart murmur    no problems    Hemorrhoids, internal, with bleeding 06/27/2017   History of hiatal hernia    Hypertension    IBS (irritable bowel syndrome)    Lactose intolerance    Mitral valve prolapse    OSA (obstructive sleep apnea)    on CPAP-Mild - Sleep study 2004   Pre-diabetes    Pulmonary embolus (HCC)    2010 and 12-2013   Pulmonary nodule 12/13/2022   seen on CT chest xray on 12/16/22 - left upper lobe   Sleep apnea    wears CPAP   Thyroid  nodule      dr. watching    Tinnitus    Subjective   Zoster 2014   R flank   Past Surgical History:  Procedure Laterality Date    ABDOMINAL HYSTERECTOMY     still has 1 ovary    APPENDECTOMY     BRAIN SURGERY  1995   Stem surgery, Arnold Chiari Malformation   CATARACT EXTRACTION Bilateral 2017   cataracts, Dr. Lasandra Points   CHOLECYSTECTOMY     COLONOSCOPY     ESOPHAGEAL DILATION N/A 02/06/2023   Procedure: ESOPHAGEAL DILATION;  Surgeon: Hilarie Lovely, MD;  [facility]: [facility] OR;  Service: Thoracic;  Laterality: N/A;   ESOPHAGEAL DILATION N/A 02/13/2023   Procedure: ESOPHAGEAL DILATION;  Surgeon: Hilarie Lovely, MD;  [facility]: [facility] OR;  Service: Thoracic;  Laterality: N/A;   ESOPHAGEAL DILATION N/A 03/12/2023   Procedure: ESOPHAGEAL DILATION;  Surgeon: Hilarie Lovely, MD;  Location: [facility] OR;  Service: Thoracic;  Laterality: N/A;   ESOPHAGEAL MANOMETRY N/A 08/06/2019   Procedure: ESOPHAGEAL MANOMETRY (EM);  Surgeon: Kenney Peacemaker, MD;  [facility];  Service: Endoscopy;  Laterality: N/A;   ESOPHAGOGASTRODUODENOSCOPY N/A 02/06/2023   Procedure: ESOPHAGOGASTRODUODENOSCOPY (EGD);  Surgeon: Hilarie Lovely, MD;  Location: [facility] OR;  Service: Thoracic;  Laterality: N/A;  With Savary dilation  ESOPHAGOGASTRODUODENOSCOPY N/A 02/13/2023   Procedure: ESOPHAGOGASTRODUODENOSCOPY (EGD);  Surgeon: Hilarie Lovely, MD;  Location: [facility] OR;  Service: Thoracic;  Laterality: N/A;   ESOPHAGOGASTRODUODENOSCOPY N/A 03/12/2023   Procedure: ESOPHAGOGASTRODUODENOSCOPY (EGD);  Surgeon: Hilarie Lovely, MD;  Location: [facility] OR;  Service: Thoracic;  Laterality: N/A;   HEMORRHOID BANDING     HERNIA REPAIR     Hital Hernia   JOINT REPLACEMENT Right    knee   KNEE ARTHROSCOPY  05/16/2012   Procedure: ARTHROSCOPY KNEE;  Surgeon: Jonna Netter, MD;  Location: [facility] OR;  Service: Orthopedics;  Laterality: Left;   KNEE ARTHROSCOPY Right 01/2018   NISSEN FUNDOPLICATION     X 3 lab, open x 2 last with mesh   POLYPECTOMY     REDUCTION MAMMAPLASTY Bilateral 2007   TOTAL KNEE ARTHROPLASTY Right 08/25/2019   Procedure: RIGHT  TOTAL KNEE ARTHROPLASTY;  Surgeon: Liliane Rei, MD;  [facility];  Service: Orthopedics;  Laterality: Right;    UPPER GASTROINTESTINAL ENDOSCOPY     Patient Active Problem List   Diagnosis Date Noted   Vitamin D  deficiency 12/12/2022   Decreased GFR 12/12/2022   Physically inactive 12/12/2022   Calculus of kidney 07/14/2021   OSA on CPAP 06/08/2021   Circadian rhythm sleep disorder, delayed sleep phase type 06/08/2021   Glaucoma suspect 06/25/2020   S/P total knee arthroplasty, right 08/27/2019   OA (osteoarthritis) of knee 08/25/2019   Primary osteoarthritis of right knee 08/25/2019   Atypical chest pain    Hemorrhoids, internal, with bleeding 06/27/2017   IDA (iron deficiency anemia) 02/20/2017   Chronic fatigue 02/03/2017   Left leg pain 11/29/2016   History of colonic polyps 12/02/2015   PCP NOTES >>>>>>>>>>>>>>>>>>>>>>>>> 02/13/2015   Anxiety 08/04/2014   Annual physical exam 04/29/2014   Fibromyalgia syndrome 02/04/2014   Dysphagia 01/20/2014   Chronic cough 01/08/2014   DOE (dyspnea on exertion) 12/26/2013   Diabetes (HCC) 09/24/2013   Abdominal pain, chronic, right flank -upper quadrant 07/02/2012   Class 2 severe obesity with serious comorbidity and body mass index (BMI) of 38.0 to 38.9 in adult [facility]) 09/30/2009   Hypersomnia with sleep apnea 09/28/2009   Essential hypertension 06/24/2009   DIZZINESS 06/24/2009   HEADACHE 06/24/2009   VENOUS INSUFFICIENCY, LEGS 11/19/2008   Acute pulmonary embolism (HCC) 05/19/2008   Pulmonary nodule 08/26/2007   Stricture and stenosis of esophagus 05/22/2007   GERD 01/31/2007   IBS (irritable bowel syndrome) 01/31/2007   MITRAL VALVE PROLAPSE, HX OF 01/31/2007    PCP: Devonna Foley MD  REFERRING PROVIDER: Mort Ards, MD   REFERRING DIAG: M54.50 (ICD-10-CM) - Low back pain   Rationale for Evaluation and Treatment: Rehabilitation  THERAPY DIAG:  Other low back pain  Other abnormalities of gait and  mobility  Muscle weakness (generalized)  ONSET DATE: Last 2 yrs  SUBJECTIVE:  SUBJECTIVE STATEMENT: Pt reports she joined [employer] and has began using machine. Going to start personal training with Nate. Back is doing well overall.   PN: Pt states she has finished aquatic therapy and has an aquatic program.  Pt states she is a lot better.  Pt hasn't had the major back attacks  recently.  Pt is able to perform activities around her home with less pain.  Pt is able to lie on back without pain.  Her pain has improved with standing to perform household chores including cleaning and cooking.  Pt has improved tolerance with cleaning home.  Pt reports a 45% improvement in pain.  Pt states her back is hurting today because she did a lot of yard work this weekend.  Pt states she has pain and difficulty with lifting objects.   Pt has set an alarm to get up every 30 mins while she is on the computer.  Pt reports compliance with HEP.  Pt plans to join [employer] and use the pool.     PERTINENT HISTORY:   Lumbar spondylosis R TKR in 2021, L knee arthroscopy in 2014, DM type 2, Fibromyalgia   PAIN:  Are you having pain? Yes: NPRS scale: currently 4.5/10 current, 5/10 worst, 1.5-2/10 best Pain location:  central lumbar and bilat lumbar flanks Pain description: ache Aggravating factors: sitting >30 minutes, lying on left side Relieving factors: heating pad, pain patches, tylenol   PRECAUTIONS: None  RED FLAGS: None   WEIGHT BEARING RESTRICTIONS: No  FALLS:  Has patient fallen in last 6 months? No   OCCUPATION: retired; Clinical cytogeneticist  PLOF: Independent  PATIENT GOALS: improve strength, get back into exercise.  NEXT MD VISIT: as needed  OBJECTIVE:  Note: Objective measures were completed at Evaluation  unless otherwise noted.  DIAGNOSTIC FINDINGS:  Imaging from today does demonstrate mild degenerative disc disease at L4-L5 and L5-S1   PATIENT SURVEYS:  Modified Oswestry Initial/Current:  33% / 20%   COGNITION: Overall cognitive status: Within functional limits for tasks assessed     SENSATION: WFL  MUSCLE LENGTH: Hamstrings: Bilateral hamstring    POSTURE: rounded shoulders, forward head, and left knee valgus  PALPATION: Center l4-5; L5-S1  LUMBAR ROM:  wfl  LOWER EXTREMITY ROM:     wfl  LOWER EXTREMITY MMT:    MMT Right eval Left eval R / L 4/8 Right 6/3   Left 6/3  Hip flexion 40.6 37.1 21.1 / 36.1 34.7 39.8  Hip extension       Hip abduction 27.4 26.7 25.9 / 20.4 29.7 32.8  Hip adduction       Hip internal rotation       Hip external rotation       Knee flexion       Knee extension 47.1 29.3 29.4/ 30.2 31.0 27.5  Ankle dorsiflexion       Ankle plantarflexion       Ankle inversion       Ankle eversion        (Blank rows = not tested)   FUNCTIONAL TESTS:  30 s STS 4 from pool bench with ue assist (norm 10-14 ) Timed up and go (TUG): 14.59 4 stage balance: passed 1&2; Tandem x 10s (unsteady); SLS x 6s     07/31/23   30s STS from pool bench: 5   TUG 13.51   4 stage balance: SLS: 14s    09/04/2023   30 sec STS test:  6.5 reps with UE support toward the end  of the test TUG:  8.86 sec  09/25/2023 30 sec sit to stand test:  6.5 reps without UE support except on the last rep       GAIT: Pt had no limp with gait.    TREATMENT   10/08/2023 Seated lumbar flexion stretch Attempted child's pose, but hurt knees Bridges 2x10 90/90 marches 4x5 ea Supine SLR with TrA x10ea STS 2x10 Sidestepping at rail x3 laps Standing hip abduction x10bil Standing hip extension x10bil     09/25/2023 Reviewed current function, pt presentation, and pain levels.  Reviewed goals and pt progress/deficits.   Assessed strength and 30 sec sit to stand  test. Reviewed HEP.  See below for pt education.    PATIENT EDUCATION:  Education details: POC, HEP, objective findings, and goal progress.   Person educated: Patient Education method: Explanation, Demonstration, Tactile cues, Verbal cues Education comprehension: verbalized understanding, returned demonstration, verbal cues required, tactile cues required, and needs further education     HOME EXERCISE PROGRAM: Access Code: [email or web address] URL: [email or web address] Date: 08/02/2023 Prepared by: Gellen April Erman Hayward  Exercises - Heel Raises with Counter Support  - 2-3 x daily - 1 sets - 8-10 reps - Supine Bridge  - 1 x daily - 7 x weekly - 2 sets - 10 reps - Sidelying Hip Abduction  - 1 x daily - 7 x weekly - 2 sets - 10 reps - Dead Bug  - 1 x daily - 7 x weekly - 2 sets - 10 reps - Standing with Back Flat Against Wall  - 1 x daily - 7 x weekly - 2 sets - 10 reps  AQUATIC Access Code: [email or web address] URL: [email or web address] Date: 09/06/2023 Prepared by: [facility] - [address] This aquatic home exercise program from [employer] utilizes pictures from land based exercises, but has been adapted prior to lamination and issuance.    ASSESSMENT:  CLINICAL IMPRESSION: Good tolerance for progressions to PT interventions, though pt did fatigue quickly. Pt demonstrates endurance deficits and will benefit from additional PT to work up to independent program. Discussed PT POC and eventual transition to gym/pool independent programs. She will begin working out at [facility] independently and with Nate for personal training. Will continue to progress with strength and endurance as tolerated.   PN: Pt is improving with pain and sx's as evidenced by subjective reports.  Pt reports a 45% improvement in pain and states she feels much better.  She hasn't had the major back attacks  recently.  Pt is able to perform activities around her home with less pain  including cleaning and cooking.  Pt has improved standing tolerance.  Pt has completed aquatic therapy and wants to transition to land based therapy.  PT assessed LE strength.  She has improved strength in bilat hip abduction and has weakness in bilat quads.  Pt has improved with 5x STS test overall though no improvement since prior testing.  Pt demonstrated clinically significant improvement in self perceived disability with modified oswestry improving from 33% to 20%.  Pt has met all STG's and LTG #6 prior.  She met LTG #1 and partially met #3.  Pt may benefit from transitioning to land based therapy to address ongoing goals, improve core and LE strength, and improve function.     OBJECTIVE IMPAIRMENTS: Abnormal gait, decreased activity tolerance, decreased balance, impaired flexibility, and pain.   ACTIVITY LIMITATIONS: sleeping, stairs, transfers, and locomotion level  PARTICIPATION LIMITATIONS: shopping and community activity  PERSONAL FACTORS: Fitness are  also affecting patient's functional outcome.   REHAB POTENTIAL: Good  CLINICAL DECISION MAKING: Evolving/moderate complexity  EVALUATION COMPLEXITY: Moderate   GOALS: Goals reviewed with patient? Yes  SHORT TERM GOALS: Target date: 07/07/23  Pt will tolerate full aquatic sessions consistently without increase in pain and with improving function to demonstrate good toleration and effectiveness of intervention.  Baseline: 07/10/23: reports improving tolerance w/ aquatics Goal status: MET  2. Pt will report walking x 15 minutes 5/7 days a week to demonstrate committment to increasing her physical activity Baseline: walking 30 min (on walking pad)  Goal status: MET - 07/17/23  3.  Pt will report a reduction of LBP by at least 25% Baseline: see chart 07/10/23: continues to endorse fluctuations 07/25/23: 40% Goal status: Met 07/25/23  4.  Pt will tolerate land based exercises without adverse effects for improved tolerance to  activity, strength, and functional mobility.   Goal status:  INITIAL  Target date:  10/16/2023    LONG TERM GOALS: Target date: 11/06/23  Pt to improve on ODI by  20 % to demonstrate statistically significant Improvement in function. Baseline: 33% Goal status: GOAL MET  6/3  2.  Pt will be indep with final HEP's (land and aquatic as appropriate) for continued management of condition Baseline:  Goal status: PROGRESSING  3.  Pt will report decrease in pain by at least 75% for improved toleration to activity/quality of life and to demonstrate improved management of pain.  Baseline: 60% MET   09/25/23 Goal status: In progress  4.  Pt will improve strength in LE up towards 10lbs to demonstrate improved overall physical function Baseline: see chart Goal status: NOT MET   5.  Pt will improve on 30s STS test to <or= 8 to demonstrate improving functional lower extremity strength, transitional movements, and balance Baseline: 4 on eval ; 5 07/31/23 ; 6.5  on 5/13 and 6/3 Goal status: In progress  6.  Pt will improve on Tug test to <or=13s (norm) to demonstrate improvement in lower extremity function, mobility and decreased fall risk. Baseline: 14.59; 13.51 (07/31/23) ; 8.66 (09/04/23) Goal status:  GOAL MET  5/13  PLAN:  PT FREQUENCY: 1-2x/wk  PT DURATION: 4-6 weeks  PLANNED INTERVENTIONS: 40981- PT Re-evaluation, 97110-Therapeutic exercises, 97530- Therapeutic activity, 97112- Neuromuscular re-education, 97535- Self Care, [id number]- Manual therapy, [phone]- Gait training, [phone]- Orthotic Fit/training, [phone]- Aquatic Therapy, [phone]- Ionotophoresis 4mg /ml Dexamethasone , Patient/Family education, Balance training, Stair training, Taping, Dry Needling, Joint mobilization, Cryotherapy, and Moist heat.  PLAN FOR NEXT SESSION:  land based intervention for added load bearing strengthening and final HEP for continued management of chronic condition  Herb Loges, PTA  10/08/23 1:58 PM

## 2023-10-09 ENCOUNTER — Ambulatory Visit (INDEPENDENT_AMBULATORY_CARE_PROVIDER_SITE_OTHER): Admitting: Internal Medicine

## 2023-10-09 ENCOUNTER — Encounter (INDEPENDENT_AMBULATORY_CARE_PROVIDER_SITE_OTHER): Payer: Self-pay | Admitting: Internal Medicine

## 2023-10-09 VITALS — BP 115/75 | HR 96 | Temp 98.3°F | Ht 68.0 in | Wt 224.0 lb

## 2023-10-09 DIAGNOSIS — E1169 Type 2 diabetes mellitus with other specified complication: Secondary | ICD-10-CM

## 2023-10-09 DIAGNOSIS — G4733 Obstructive sleep apnea (adult) (pediatric): Secondary | ICD-10-CM | POA: Diagnosis not present

## 2023-10-09 DIAGNOSIS — E66812 Obesity, class 2: Secondary | ICD-10-CM

## 2023-10-09 DIAGNOSIS — Z6838 Body mass index (BMI) 38.0-38.9, adult: Secondary | ICD-10-CM

## 2023-10-09 DIAGNOSIS — I10 Essential (primary) hypertension: Secondary | ICD-10-CM

## 2023-10-09 DIAGNOSIS — Z7985 Long-term (current) use of injectable non-insulin antidiabetic drugs: Secondary | ICD-10-CM

## 2023-10-09 DIAGNOSIS — I749 Embolism and thrombosis of unspecified artery: Secondary | ICD-10-CM | POA: Diagnosis not present

## 2023-10-09 MED ORDER — TIRZEPATIDE 10 MG/0.5ML ~~LOC~~ SOAJ
10.0000 mg | SUBCUTANEOUS | 0 refills | Status: DC
Start: 2023-10-09 — End: 2023-12-03

## 2023-10-09 NOTE — Progress Notes (Signed)
 Office: [phone]  /  Fax: [phone]  Weight Summary And Biometrics  Vitals Temp: 98.3 F (36.8 C) BP: 115/75 Pulse Rate: 96   Anthropometric Measurements Height: 5' 8 (1.727 m) Weight: 224 lb (101.6 kg) BMI (Calculated): 34.07 Weight at Last Visit: 222 lb Weight Lost Since Last Visit: 0 lb Weight Gained Since Last Visit: 2 lb Starting Weight: 256 lb Total Weight Loss (lbs): 33 lb (15 kg) Peak Weight: 259 lb   Body Composition  Body Fat %: 45.4 % Fat Mass (lbs): 102 lbs Muscle Mass (lbs): 116.6 lbs Total Body Water (lbs): 80 lbs Visceral Fat Rating : 14    RMR: 1771  Today's Visit #: 11  Starting Date: 12/12/22   Subjective   Chief Complaint: Obesity  Interval History Discussed the use of AI scribe software for clinical note transcription with the patient, who gave verbal consent to proceed.  History of Present Illness   Jennifer [name] is a 73 year old female with type two diabetes and obesity who presents for medical weight management.  She has gained two pounds since her last visit. She adheres to a 1200 calorie nutrition plan 70-80% of the time, tracks her calories, consumes more whole foods, ensures adequate protein intake, maintains hydration, and does not skip meals. She exercises three days a week for about 30 minutes, incorporating both strength and cardio exercises. She reports adequate sleep and no high levels of stress.  Her type two diabetes is managed with Mounjaro  7.5 mg once a week, which is also used for obesity management. She has been feeling hungry lately, especially after meals and sometimes between meals. She consumes cottage cheese with grapes and 'fluff bars like marshmallows', which she believes might not be beneficial due to their processed nature.  She has a history of hypertension, which is well-controlled, and a history of thromboembolic disease, for which she is on a blood thinner due to a past pulmonary embolism. She also has  obstructive sleep apnea and uses a CPAP machine for management.  Her social history includes attending [employer] for exercise, where she is learning to use the machines. She is involved in therapy for her lower body and plans to continue with a few more sessions. She mentions her granddaughter and her involvement in swimming lessons.       Challenges affecting patient progress: strong hunger signals and/or impaired satiety / inhibitory control, medical comorbidities, and menopause.    Pharmacotherapy for weight management: She is currently taking Monjauro with diabetes as the primary indication with adequate clinical response  and without side effects..   Assessment and Plan   Treatment Plan For Obesity:  Recommended Dietary Goals  [name] is currently in the action stage of change. As such, her goal is to continue weight management plan. She has agreed to: continue current plan  Behavioral Health and Counseling  We discussed the following behavioral modification strategies today: continue to work on maintaining a reduced calorie state, getting the recommended amount of protein, incorporating whole foods, making healthy choices, staying well hydrated and practicing mindfulness when eating..  Additional education and resources provided today: None  Recommended Physical Activity Goals  [name] has been advised to work up to 150 minutes of moderate intensity aerobic activity a week and strengthening exercises 2-3 times per week for cardiovascular health, weight loss maintenance and preservation of muscle mass.   She has agreed to :  continue to gradually increase the amount and intensity of exercise routine  Pharmacotherapy  We discussed various medication options to help [name] with her weight loss efforts and we both agreed to : Increase Mounjaro  to 10 mg once a week primary indication type 2 diabetes secondary weight management and associated complications  Associated Conditions  Impacted by Obesity Treatment  Essential hypertension  OSA on CPAP -     Tirzepatide ; Inject 10 mg into the skin once a week.  Dispense: 2 mL; Refill: 0  Type 2 diabetes mellitus with other specified complication, without long-term current use of insulin  (HCC) -     Tirzepatide ; Inject 10 mg into the skin once a week.  Dispense: 2 mL; Refill: 0  Class 2 severe obesity with serious comorbidity and body mass index (BMI) of 38.0 to 38.9 in adult, unspecified obesity type (HCC) -     Tirzepatide ; Inject 10 mg into the skin once a week.  Dispense: 2 mL; Refill: 0     Assessment and Plan    Obesity Obesity managed with Mounjaro  7.5 mg weekly. Despite adherence to a 1200 calorie diet 70-80% of the time and exercising thrice weekly, she gained two pounds, attributed to increased muscle mass. Reports breakthrough hunger, likely due to meal composition, particularly processed foods and carbohydrates. Emphasized focusing on body composition over weight and discussed fiber, protein, and hydration for fullness. Mounjaro  slows digestion and affects appetite control at the brain level. Increasing the dose to 10 mg is expected to enhance appetite suppression and aid in weight loss. - Increase Mounjaro  to 10 mg weekly to enhance appetite suppression. - Encourage focus on body composition rather than weight. - Advise dietary modifications to include more fiber, protein, and hydration. - Educate on consistent eating patterns and avoiding processed foods.  Type 2 Diabetes Mellitus Diabetes well-controlled with hemoglobin A1c of 6.2% as of February. Discussed carbohydrate impact on insulin  levels and weight management, emphasizing balanced diet for effective diabetes management. Mounjaro  aids in diabetes management by slowing digestion and reducing appetite, helping control blood glucose levels. - Continue Mounjaro  for diabetes management. - Monitor blood glucose levels regularly. - Advise on dietary  modifications to manage carbohydrate intake.  Obstructive Sleep Apnea OSA managed with CPAP therapy. Weight loss, facilitated by Mounjaro , can improve OSA symptoms by reducing excess body fat, a contributing factor to OSA. - Continue CPAP therapy. - Encourage weight loss to improve OSA symptoms.  Hypertension Hypertension well-controlled at 115/75 mmHg. Weight loss can further improve blood pressure control by reducing excess body fat and associated inflammation. - Continue current blood pressure management regimen. - Encourage weight loss to maintain blood pressure control.  Thromboembolic Disease On anticoagulation therapy for thromboembolic disease. Weight loss reduces inflammation, decreasing thromboembolic risk. Emphasized that obesity contributes to blood clot formation, and losing weight improves overall health and reduces thromboembolic risk. - Continue anticoagulation therapy. - Encourage weight loss to reduce inflammation and thromboembolic risk.  Follow-up Follow-up appointment scheduled in four weeks to assess progress and adjust treatment as necessary. Mounjaro  prescription to be sent to [facility]. - Schedule follow-up appointment in four weeks. - Send Mounjaro  prescription to [facility].        Objective   Physical Exam:  Blood pressure 115/75, pulse 96, temperature 98.3 F (36.8 C), height 5' 8 (1.727 m), weight 224 lb (101.6 kg). Body mass index is 34.06 kg/m.  General: She is overweight, cooperative, alert, well developed, and in no acute distress. PSYCH: Has normal mood, affect and thought process.   HEENT: EOMI, sclerae are anicteric. Lungs: Normal breathing effort, no conversational  dyspnea. Extremities: No edema.  Neurologic: No gross sensory or motor deficits. No tremors or fasciculations noted.    Diagnostic Data Reviewed:  BMET    Component Value Date/Time   NA 142 06/18/2023 1437   NA 143 12/12/2022 1100   NA 143 02/16/2017 1450   NA 142 01/19/2016  1141   K 4.1 06/18/2023 1437   K 3.7 02/16/2017 1450   K 3.7 01/19/2016 1141   CL 104 06/18/2023 1437   CL 105 02/16/2017 1450   CO2 31 06/18/2023 1437   CO2 32 02/16/2017 1450   CO2 27 01/19/2016 1141   GLUCOSE 85 06/18/2023 1437   GLUCOSE 88 02/16/2017 1450   BUN 20 06/18/2023 1437   BUN 17 12/12/2022 1100   BUN 14 02/16/2017 1450   BUN 19.5 01/19/2016 1141   CREATININE 0.98 06/18/2023 1437   CREATININE 1.2 02/16/2017 1450   CREATININE 0.9 01/19/2016 1141   CALCIUM  9.5 06/18/2023 1437   CALCIUM  8.8 02/16/2017 1450   CALCIUM  8.8 01/19/2016 1141   GFRNONAA >60 06/18/2023 1437   GFRAA >60 12/25/2019 1353   Lab Results  Component Value Date   HGBA1C 6.2 (H) 06/08/2023   HGBA1C 6.2 (H) 10/14/2007   Lab Results  Component Value Date   INSULIN  20.4 12/12/2022   INSULIN  12.6 05/19/2021   Lab Results  Component Value Date   TSH 1.82 07/24/2023   CBC    Component Value Date/Time   WBC 4.5 06/18/2023 1437   WBC 4.5 03/12/2023 0721   RBC 4.24 06/18/2023 1437   HGB 12.1 06/18/2023 1437   HGB 12.1 02/16/2017 1450   HCT 37.1 06/18/2023 1437   HCT 36.6 02/16/2017 1450   PLT 209 06/18/2023 1437   PLT 239 02/16/2017 1450   MCV 87.5 06/18/2023 1437   MCV 87 02/16/2017 1450   MCH 28.5 06/18/2023 1437   MCHC 32.6 06/18/2023 1437   RDW 15.0 06/18/2023 1437   RDW 15.4 02/16/2017 1450   Iron Studies    Component Value Date/Time   IRON 59 12/05/2021 1451   IRON 49 02/16/2017 1536   TIBC 323 12/05/2021 1451   TIBC 284 02/16/2017 1536   FERRITIN 51 12/05/2021 1452   FERRITIN 66 02/16/2017 1536   IRONPCTSAT 18 12/05/2021 1451   IRONPCTSAT 17 (L) 02/16/2017 1536   Lipid Panel     Component Value Date/Time   CHOL 115 06/08/2023 1422   TRIG 64 06/08/2023 1422   HDL 67 06/08/2023 1422   CHOLHDL 1.7 06/08/2023 1422   VLDL 9.8 06/05/2022 1320   LDLCALC 34 06/08/2023 1422   Hepatic Function Panel     Component Value Date/Time   PROT 7.2 06/18/2023 1437   PROT 7.1  12/12/2022 1100   PROT 7.3 02/16/2017 1450   PROT 7.3 01/19/2016 1141   ALBUMIN 4.1 06/18/2023 1437   ALBUMIN 4.3 12/12/2022 1100   ALBUMIN 3.6 01/19/2016 1141   AST 26 06/18/2023 1437   AST 21 01/19/2016 1141   ALT 32 06/18/2023 1437   ALT 27 02/16/2017 1450   ALT 18 01/19/2016 1141   ALKPHOS 51 06/18/2023 1437   ALKPHOS 57 02/16/2017 1450   ALKPHOS 59 01/19/2016 1141   BILITOT 0.4 06/18/2023 1437   BILITOT 0.31 01/19/2016 1141   BILIDIR 0.0 01/08/2012 1224   IBILI 0.2 12/30/2010 1515      Component Value Date/Time   TSH 1.82 07/24/2023 1436   Nutritional Lab Results  Component Value Date   VD25OH 61.5 12/12/2022  VD25OH 42.2 05/19/2021    Medications: Outpatient Encounter Medications as of 10/09/2023  Medication Sig   acetaminophen  (TYLENOL ) 500 MG tablet Take 1 tablet (500 mg total) by mouth every 6 (six) hours as needed. (Patient taking differently: Take 500 mg by mouth every 6 (six) hours as needed for moderate pain (pain score 4-6), mild pain (pain score 1-3) or headache.)   Apoaequorin (PREVAGEN) 10 MG CAPS Take 10 mg by mouth daily. prevagen   cycloSPORINE  (RESTASIS ) 0.05 % ophthalmic emulsion Place 1 drop into both eyes 2 (two) times daily.   escitalopram  (LEXAPRO ) 10 MG tablet Take 1 tablet (10 mg total) by mouth daily.   hydrocortisone  valerate cream (WESTCORT ) 0.2 % Apply 1 Application topically daily as needed (itching).   hyoscyamine  (LEVSIN ) 0.125 MG tablet Take 1 tablet (0.125 mg total) by mouth every 4 hours as needed.   Multiple Vitamins-Minerals (MULTIVITAMIN WITH MINERALS) tablet Take 2 tablets by mouth daily. Vitafusion woman gummy   NEXIUM  40 MG capsule Take 1 capsule (40 mg total) by mouth daily before breakfast.   olopatadine (PATADAY) 0.1 % ophthalmic solution Place 1 drop into the left eye 2 (two) times daily.   rosuvastatin  (CRESTOR ) 10 MG tablet Take 1 tablet (10 mg total) by mouth at bedtime.   temazepam  (RESTORIL ) 30 MG capsule TAKE ONE CAPSULE  BY MOUTH AT BEDTIME AS NEEDED F SLEEP   tirzepatide  (MOUNJARO ) 10 MG/0.5ML Pen Inject 10 mg into the skin once a week.   triamterene -hydrochlorothiazide  (MAXZIDE -25) 37.5-25 MG tablet Take 0.5 tablets by mouth daily.   XARELTO  2.5 MG TABS tablet TAKE TWO TABLETS BY MOUTH DAILY   [DISCONTINUED] tirzepatide  (MOUNJARO ) 7.5 MG/0.5ML Pen Inject 7.5 mg into the skin once a week.   No facility-administered encounter medications on file as of 10/09/2023.     Follow-Up   Return in about 4 weeks (around 11/06/2023) for For Weight Mangement with Dr. Allie Area.Aaron Aas She was informed of the importance of frequent follow up visits to maximize her success with intensive lifestyle modifications for her multiple health conditions.  Attestation Statement   Reviewed by clinician on day of visit: allergies, medications, problem list, medical history, surgical history, family history, social history, and previous encounter notes.     Ladd Picker, MD

## 2023-10-12 ENCOUNTER — Ambulatory Visit: Admitting: Dietician

## 2023-10-16 ENCOUNTER — Other Ambulatory Visit: Payer: Self-pay | Admitting: Gastroenterology

## 2023-10-16 ENCOUNTER — Other Ambulatory Visit (INDEPENDENT_AMBULATORY_CARE_PROVIDER_SITE_OTHER): Payer: Self-pay | Admitting: Internal Medicine

## 2023-10-16 DIAGNOSIS — G4733 Obstructive sleep apnea (adult) (pediatric): Secondary | ICD-10-CM

## 2023-10-16 DIAGNOSIS — E66812 Obesity, class 2: Secondary | ICD-10-CM

## 2023-10-16 DIAGNOSIS — E1169 Type 2 diabetes mellitus with other specified complication: Secondary | ICD-10-CM

## 2023-10-17 ENCOUNTER — Encounter: Payer: Self-pay | Admitting: Medical Oncology

## 2023-10-17 ENCOUNTER — Inpatient Hospital Stay: Payer: Medicare Other | Attending: Hematology & Oncology

## 2023-10-17 ENCOUNTER — Inpatient Hospital Stay (HOSPITAL_BASED_OUTPATIENT_CLINIC_OR_DEPARTMENT_OTHER): Payer: Medicare Other | Admitting: Medical Oncology

## 2023-10-17 VITALS — BP 118/67 | HR 73 | Temp 98.0°F | Resp 18 | Ht 68.0 in | Wt 224.1 lb

## 2023-10-17 DIAGNOSIS — Z86718 Personal history of other venous thrombosis and embolism: Secondary | ICD-10-CM

## 2023-10-17 DIAGNOSIS — I2690 Septic pulmonary embolism without acute cor pulmonale: Secondary | ICD-10-CM

## 2023-10-17 DIAGNOSIS — Z86711 Personal history of pulmonary embolism: Secondary | ICD-10-CM | POA: Diagnosis not present

## 2023-10-17 DIAGNOSIS — Z7901 Long term (current) use of anticoagulants: Secondary | ICD-10-CM

## 2023-10-17 DIAGNOSIS — Z5181 Encounter for therapeutic drug level monitoring: Secondary | ICD-10-CM | POA: Diagnosis not present

## 2023-10-17 DIAGNOSIS — I2601 Septic pulmonary embolism with acute cor pulmonale: Secondary | ICD-10-CM

## 2023-10-17 LAB — CMP (CANCER CENTER ONLY)
ALT: 31 U/L (ref 0–44)
AST: 29 U/L (ref 15–41)
Albumin: 4.4 g/dL (ref 3.5–5.0)
Alkaline Phosphatase: 51 U/L (ref 38–126)
Anion gap: 8 (ref 5–15)
BUN: 22 mg/dL (ref 8–23)
CO2: 29 mmol/L (ref 22–32)
Calcium: 9.6 mg/dL (ref 8.9–10.3)
Chloride: 104 mmol/L (ref 98–111)
Creatinine: 1.04 mg/dL — ABNORMAL HIGH (ref 0.44–1.00)
GFR, Estimated: 58 mL/min — ABNORMAL LOW (ref >60)
Glucose, Bld: 91 mg/dL (ref 70–99)
Potassium: 4 mmol/L (ref 3.5–5.1)
Sodium: 141 mmol/L (ref 135–145)
Total Bilirubin: 0.5 mg/dL (ref 0.0–1.2)
Total Protein: 7.3 g/dL (ref 6.5–8.1)

## 2023-10-17 LAB — CBC WITH DIFFERENTIAL (CANCER CENTER ONLY)
Abs Immature Granulocytes: 0 10*3/uL (ref 0.00–0.07)
Basophils Absolute: 0 10*3/uL (ref 0.0–0.1)
Basophils Relative: 1 %
Eosinophils Absolute: 0.2 10*3/uL (ref 0.0–0.5)
Eosinophils Relative: 4 %
HCT: 36.7 % (ref 36.0–46.0)
Hemoglobin: 12.2 g/dL (ref 12.0–15.0)
Immature Granulocytes: 0 %
Lymphocytes Relative: 41 %
Lymphs Abs: 1.8 10*3/uL (ref 0.7–4.0)
MCH: 28.5 pg (ref 26.0–34.0)
MCHC: 33.2 g/dL (ref 30.0–36.0)
MCV: 85.7 fL (ref 80.0–100.0)
Monocytes Absolute: 0.4 10*3/uL (ref 0.1–1.0)
Monocytes Relative: 10 %
Neutro Abs: 1.9 10*3/uL (ref 1.7–7.7)
Neutrophils Relative %: 44 %
Platelet Count: 220 10*3/uL (ref 150–400)
RBC: 4.28 MIL/uL (ref 3.87–5.11)
RDW: 15.6 % — ABNORMAL HIGH (ref 11.5–15.5)
WBC Count: 4.4 10*3/uL (ref 4.0–10.5)
nRBC: 0 % (ref 0.0–0.2)

## 2023-10-17 NOTE — Progress Notes (Signed)
 Hematology and Oncology Follow Up Visit  Jennifer [name] [id number] October 09, 1950 73 y.o. 10/17/2023   Principle Diagnosis:  Recurrent pulmonary embolism   Current Therapy:        Xarelto  5 mg po q day - lifelong              Interim History:  [name] is here today for follow-up. She is doing well and has no complaints at this time.   She states that she is doing well. No concerns.  She is doing well on maintenance Xarelto .  No blood loss, abnormal bruising or petechiae.  She has esophageal strictures and feels that she may have to go back for dilation soon. She has this done about every 6 months with GI.  No fever, chills, n/v, cough, rash, dizziness, SOB, chest pain, palpitations, abdominal pain or changes in bowel or bladder habits.  No swelling, tenderness, numbness or tingling in her extremities at this time.  No falls or syncope.  Appetite and hydration are good.  ECOG Performance Status: 0 - Asymptomatic Wt Readings from Last 3 Encounters:  10/17/23 224 lb 1.9 oz (101.7 kg)  10/09/23 224 lb (101.6 kg)  10/05/23 227 lb 8 oz (103.2 kg)     Medications:  Allergies as of 10/17/2023       Reactions   Doxepin  Nausea And Vomiting, Other (See Comments)   Feeling loopy   Morphine And Codeine Anaphylaxis, Shortness Of Breath   Sulfonamide Derivatives Hives   Burn from inside out   Promethazine Hcl Other (See Comments)   Hallucinations        Medication List        Accurate as of October 17, 2023  3:27 PM. If you have any questions, ask your nurse or doctor.          acetaminophen  500 MG tablet Commonly known as: TYLENOL  Take 1 tablet (500 mg total) by mouth every 6 (six) hours as needed. What changed: reasons to take this   escitalopram  10 MG tablet Commonly known as: LEXAPRO  Take 1 tablet (10 mg total) by mouth daily.   hydrocortisone  valerate cream 0.2 % Commonly known as: WESTCORT  Apply 1 Application topically daily as needed (itching).   hyoscyamine   0.125 MG tablet Commonly known as: LEVSIN  Take 1 tablet (0.125 mg total) by mouth every 4 hours as needed.   multivitamin with minerals tablet Take 2 tablets by mouth daily. Vitafusion woman gummy   NexIUM  40 MG capsule Generic drug: esomeprazole  Take 1 capsule (40 mg total) by mouth daily before breakfast.   Pataday 0.1 % ophthalmic solution Generic drug: olopatadine Place 1 drop into the left eye 2 (two) times daily.   Prevagen 10 MG Caps Generic drug: Apoaequorin Take 10 mg by mouth daily. prevagen   Restasis  0.05 % ophthalmic emulsion Generic drug: cycloSPORINE  Place 1 drop into both eyes 2 (two) times daily.   rosuvastatin  10 MG tablet Commonly known as: Crestor  Take 1 tablet (10 mg total) by mouth at bedtime.   temazepam  30 MG capsule Commonly known as: RESTORIL  TAKE ONE CAPSULE BY MOUTH AT BEDTIME AS NEEDED F SLEEP   tirzepatide  10 MG/0.5ML Pen Commonly known as: MOUNJARO  Inject 10 mg into the skin once a week.   triamterene -hydrochlorothiazide  37.5-25 MG tablet Commonly known as: MAXZIDE -25 Take 0.5 tablets by mouth daily.   Xarelto  2.5 MG Tabs tablet Generic drug: rivaroxaban  TAKE TWO TABLETS BY MOUTH DAILY        Allergies:  Allergies  Allergen  Reactions   Doxepin  Nausea And Vomiting and Other (See Comments)    Feeling loopy   Morphine And Codeine Anaphylaxis and Shortness Of Breath   Sulfonamide Derivatives Hives    Burn from inside out   Promethazine Hcl Other (See Comments)    Hallucinations    Past Medical History, Surgical history, Social history, and Family History were reviewed and updated.  Review of Systems: All other 10 point review of systems is negative.   Physical Exam:  height is 5' 8 (1.727 m) and weight is 224 lb 1.9 oz (101.7 kg). Her oral temperature is 98 F (36.7 C). Her blood pressure is 118/67 and her pulse is 73. Her respiration is 18 and oxygen saturation is 100%.   Wt Readings from Last 3 Encounters:  10/17/23  224 lb 1.9 oz (101.7 kg)  10/09/23 224 lb (101.6 kg)  10/05/23 227 lb 8 oz (103.2 kg)    Ocular: Sclerae unicteric, pupils equal, round and reactive to light Ear-nose-throat: Oropharynx clear, dentition fair Lymphatic: No cervical or supraclavicular adenopathy Lungs no rales or rhonchi, good excursion bilaterally Heart regular rate and rhythm, no murmur appreciated Abd soft, nontender, positive bowel sounds MSK no focal spinal tenderness, no joint edema Neuro: non-focal, well-oriented, appropriate affect  Lab Results  Component Value Date   WBC 4.4 10/17/2023   HGB 12.2 10/17/2023   HCT 36.7 10/17/2023   MCV 85.7 10/17/2023   PLT 220 10/17/2023   Lab Results  Component Value Date   FERRITIN 51 12/05/2021   IRON 59 12/05/2021   TIBC 323 12/05/2021   UIBC 264 12/05/2021   IRONPCTSAT 18 12/05/2021   Lab Results  Component Value Date   RETICCTPCT 1.5 12/05/2021   RBC 4.28 10/17/2023   No results found for: KPAFRELGTCHN, LAMBDASER, KAPLAMBRATIO No results found for: KIMBERLY LE, IGMSERUM No results found for: STEPHANY CARLOTA BENSON MARKEL EARLA JOANNIE DOC VICK, SPEI   Chemistry      Component Value Date/Time   NA 141 10/17/2023 1442   NA 143 12/12/2022 1100   NA 143 02/16/2017 1450   NA 142 01/19/2016 1141   K 4.0 10/17/2023 1442   K 3.7 02/16/2017 1450   K 3.7 01/19/2016 1141   CL 104 10/17/2023 1442   CL 105 02/16/2017 1450   CO2 29 10/17/2023 1442   CO2 32 02/16/2017 1450   CO2 27 01/19/2016 1141   BUN 22 10/17/2023 1442   BUN 17 12/12/2022 1100   BUN 14 02/16/2017 1450   BUN 19.5 01/19/2016 1141   CREATININE 1.04 (H) 10/17/2023 1442   CREATININE 1.2 02/16/2017 1450   CREATININE 0.9 01/19/2016 1141      Component Value Date/Time   CALCIUM  9.6 10/17/2023 1442   CALCIUM  8.8 02/16/2017 1450   CALCIUM  8.8 01/19/2016 1141   ALKPHOS 51 10/17/2023 1442   ALKPHOS 57 02/16/2017 1450   ALKPHOS 59 01/19/2016 1141    AST 29 10/17/2023 1442   AST 21 01/19/2016 1141   ALT 31 10/17/2023 1442   ALT 27 02/16/2017 1450   ALT 18 01/19/2016 1141   BILITOT 0.5 10/17/2023 1442   BILITOT 0.31 01/19/2016 1141     Encounter Diagnoses  Name Primary?   Personal history of venous thrombosis and embolism    Anticoagulation management encounter Yes    Impression and Plan: Ms. Wirtz is a very pleasant 73 yo [place] American female with history of recurrent PE. Her initial thrombus was diagnosed in 2012 and then recurred in 2015.  She is on lifelong anticoagulation with Xarelto  5 mg PO daily and tolerating well.  No bleeding or issues with her medications.  No changes to dosing or plan at this time  RTC 6 months APP, labs (CBC, CMP)  Lauraine CHRISTELLA Dais, PA-C 6/25/20253:27 PM

## 2023-10-22 ENCOUNTER — Ambulatory Visit (HOSPITAL_BASED_OUTPATIENT_CLINIC_OR_DEPARTMENT_OTHER): Payer: Self-pay

## 2023-10-22 ENCOUNTER — Encounter (HOSPITAL_BASED_OUTPATIENT_CLINIC_OR_DEPARTMENT_OTHER): Payer: Self-pay

## 2023-10-22 DIAGNOSIS — M6281 Muscle weakness (generalized): Secondary | ICD-10-CM

## 2023-10-22 DIAGNOSIS — M5459 Other low back pain: Secondary | ICD-10-CM | POA: Diagnosis not present

## 2023-10-22 DIAGNOSIS — R2689 Other abnormalities of gait and mobility: Secondary | ICD-10-CM | POA: Diagnosis not present

## 2023-10-22 NOTE — Therapy (Signed)
 OUTPATIENT PHYSICAL THERAPY THORACOLUMBAR TREATMENT Progress Note Reporting Period 08/02/2023 to 09/25/2023  See note below for Objective Data and Assessment of Progress/Goals.       Patient Name: Jennifer [name] MRN: [id number] DOB:October 04, 1950, 73 y.o., female Today's Date: 10/22/2023  END OF SESSION:  PT End of Session - 10/22/23 1525     Visit Number 20    Number of Visits 22    Date for PT Re-Evaluation 11/06/23    Authorization Type medicare    Progress Note Due on Visit 28    PT Start Time 1523    PT Stop Time 1604    PT Time Calculation (min) 41 min    Activity Tolerance Patient tolerated treatment well    Behavior During Therapy [facility] for tasks assessed/performed               Past Medical History:  Diagnosis Date   Allergy    Anemia    Arthritis    Cataract    bil cataracts removed   Chronic chest pain    Chronic fatigue 02/03/2017   Clotting disorder (HCC)    Pulmonary Embolis 2010 & 2015   Colon polyps    inflamed partially ulcerated hyperplastic polyp   Diabetes (HCC) 09/24/2013   no meds   Diverticulosis    Dysphagia esophageal phase - chronic after 3 fundoplications 01/20/2014   Edema of both lower extremities    Esophageal stenosis    Stricture after fundoplication REDO   Fibromyalgia    GERD (gastroesophageal reflux disease)    Glaucoma suspect    Heart murmur    no problems    Hemorrhoids, internal, with bleeding 06/27/2017   History of hiatal hernia    Hypertension    IBS (irritable bowel syndrome)    Lactose intolerance    Mitral valve prolapse    OSA (obstructive sleep apnea)    on CPAP-Mild - Sleep study 2004   Pre-diabetes    Pulmonary embolus (HCC)    2010 and 12-2013   Pulmonary nodule 12/13/2022   seen on CT chest xray on 12/16/22 - left upper lobe   Sleep apnea    wears CPAP   Thyroid  nodule      dr. watching    Tinnitus    Subjective   Zoster 2014   R flank   Past Surgical History:  Procedure Laterality Date    ABDOMINAL HYSTERECTOMY     still has 1 ovary    APPENDECTOMY     BRAIN SURGERY  1995   Stem surgery, Arnold Chiari Malformation   CATARACT EXTRACTION Bilateral 2017   cataracts, Dr. Cleatus   CHOLECYSTECTOMY     COLONOSCOPY     ESOPHAGEAL DILATION N/A 02/06/2023   Procedure: ESOPHAGEAL DILATION;  Surgeon: Shyrl Linnie KIDD, MD;  Location: [facility] OR;  Service: Thoracic;  Laterality: N/A;   ESOPHAGEAL DILATION N/A 02/13/2023   Procedure: ESOPHAGEAL DILATION;  Surgeon: Shyrl Linnie KIDD, MD;  Location: [facility] OR;  Service: Thoracic;  Laterality: N/A;   ESOPHAGEAL DILATION N/A 03/12/2023   Procedure: ESOPHAGEAL DILATION;  Surgeon: Shyrl Linnie KIDD, MD;  Location: [facility] OR;  Service: Thoracic;  Laterality: N/A;   ESOPHAGEAL MANOMETRY N/A 08/06/2019   Procedure: ESOPHAGEAL MANOMETRY (EM);  Surgeon: Avram Lupita BRAVO, MD;  Location: [facility] ENDOSCOPY;  Service: Endoscopy;  Laterality: N/A;   ESOPHAGOGASTRODUODENOSCOPY N/A 02/06/2023   Procedure: ESOPHAGOGASTRODUODENOSCOPY (EGD);  Surgeon: Shyrl Linnie KIDD, MD;  Location: [facility] OR;  Service: Thoracic;  Laterality: N/A;  With Bettejane  dilation   ESOPHAGOGASTRODUODENOSCOPY N/A 02/13/2023   Procedure: ESOPHAGOGASTRODUODENOSCOPY (EGD);  Surgeon: Shyrl Linnie KIDD, MD;  Location: [facility] OR;  Service: Thoracic;  Laterality: N/A;   ESOPHAGOGASTRODUODENOSCOPY N/A 03/12/2023   Procedure: ESOPHAGOGASTRODUODENOSCOPY (EGD);  Surgeon: Shyrl Linnie KIDD, MD;  Location: [facility] OR;  Service: Thoracic;  Laterality: N/A;   HEMORRHOID BANDING     HERNIA REPAIR     Hital Hernia   JOINT REPLACEMENT Right    knee   KNEE ARTHROSCOPY  05/16/2012   Procedure: ARTHROSCOPY KNEE;  Surgeon: Rome JULIANNA Pepper, MD;  Location: [facility] OR;  Service: Orthopedics;  Laterality: Left;   KNEE ARTHROSCOPY Right 01/2018   NISSEN FUNDOPLICATION     X 3 lab, open x 2 last with mesh   POLYPECTOMY     REDUCTION MAMMAPLASTY Bilateral 2007   TOTAL KNEE ARTHROPLASTY Right 08/25/2019   Procedure: RIGHT  TOTAL KNEE ARTHROPLASTY;  Surgeon: Melodi Lerner, MD;  Location: [facility] ORS;  Service: Orthopedics;  Laterality: Right;    UPPER GASTROINTESTINAL ENDOSCOPY     Patient Active Problem List   Diagnosis Date Noted   Vitamin D  deficiency 12/12/2022   Decreased GFR 12/12/2022   Physically inactive 12/12/2022   Calculus of kidney 07/14/2021   OSA on CPAP 06/08/2021   Circadian rhythm sleep disorder, delayed sleep phase type 06/08/2021   Glaucoma suspect 06/25/2020   S/P total knee arthroplasty, right 08/27/2019   OA (osteoarthritis) of knee 08/25/2019   Primary osteoarthritis of right knee 08/25/2019   Atypical chest pain    Hemorrhoids, internal, with bleeding 06/27/2017   IDA (iron deficiency anemia) 02/20/2017   Chronic fatigue 02/03/2017   Left leg pain 11/29/2016   History of colonic polyps 12/02/2015   PCP NOTES >>>>>>>>>>>>>>>>>>>>>>>>> 02/13/2015   Anxiety 08/04/2014   Annual physical exam 04/29/2014   Fibromyalgia syndrome 02/04/2014   Dysphagia 01/20/2014   Chronic cough 01/08/2014   DOE (dyspnea on exertion) 12/26/2013   Diabetes (HCC) 09/24/2013   Abdominal pain, chronic, right flank -upper quadrant 07/02/2012   Class 2 severe obesity with serious comorbidity and body mass index (BMI) of 38.0 to 38.9 in adult [facility]) 09/30/2009   Hypersomnia with sleep apnea 09/28/2009   Essential hypertension 06/24/2009   DIZZINESS 06/24/2009   HEADACHE 06/24/2009   VENOUS INSUFFICIENCY, LEGS 11/19/2008   Acute pulmonary embolism (HCC) 05/19/2008   Pulmonary nodule 08/26/2007   Stricture and stenosis of esophagus 05/22/2007   GERD 01/31/2007   IBS (irritable bowel syndrome) 01/31/2007   MITRAL VALVE PROLAPSE, HX OF 01/31/2007    PCP: Aloysius Mech MD  REFERRING PROVIDER: Burnetta Aures, MD   REFERRING DIAG: M54.50 (ICD-10-CM) - Low back pain   Rationale for Evaluation and Treatment: Rehabilitation  THERAPY DIAG:  Other low back pain  Other abnormalities of gait and  mobility  Muscle weakness (generalized)  ONSET DATE: Last 2 yrs  SUBJECTIVE:  SUBJECTIVE STATEMENT: Pt reports she joined [employer] and has began using machine. Going to start personal training with Nate. Back is doing well overall.   PN: Pt states she has finished aquatic therapy and has an aquatic program.  Pt states she is a lot better.  Pt hasn't had the major back attacks  recently.  Pt is able to perform activities around her home with less pain.  Pt is able to lie on back without pain.  Her pain has improved with standing to perform household chores including cleaning and cooking.  Pt has improved tolerance with cleaning home.  Pt reports a 45% improvement in pain.  Pt states her back is hurting today because she did a lot of yard work this weekend.  Pt states she has pain and difficulty with lifting objects.   Pt has set an alarm to get up every 30 mins while she is on the computer.  Pt reports compliance with HEP.  Pt plans to join [employer] and use the pool.     PERTINENT HISTORY:   Lumbar spondylosis R TKR in 2021, L knee arthroscopy in 2014, DM type 2, Fibromyalgia   PAIN:  Are you having pain? Yes: NPRS scale: currently 4.5/10 current, 5/10 worst, 1.5-2/10 best Pain location:  central lumbar and bilat lumbar flanks Pain description: ache Aggravating factors: sitting >30 minutes, lying on left side Relieving factors: heating pad, pain patches, tylenol   PRECAUTIONS: None  RED FLAGS: None   WEIGHT BEARING RESTRICTIONS: No  FALLS:  Has patient fallen in last 6 months? No   OCCUPATION: retired; Clinical cytogeneticist  PLOF: Independent  PATIENT GOALS: improve strength, get back into exercise.  NEXT MD VISIT: as needed  OBJECTIVE:  Note: Objective measures were completed at Evaluation  unless otherwise noted.  DIAGNOSTIC FINDINGS:  Imaging from today does demonstrate mild degenerative disc disease at L4-L5 and L5-S1   PATIENT SURVEYS:  Modified Oswestry Initial/Current:  33% / 20%   COGNITION: Overall cognitive status: Within functional limits for tasks assessed     SENSATION: WFL  MUSCLE LENGTH: Hamstrings: Bilateral hamstring    POSTURE: rounded shoulders, forward head, and left knee valgus  PALPATION: Center l4-5; L5-S1  LUMBAR ROM:  wfl  LOWER EXTREMITY ROM:     wfl  LOWER EXTREMITY MMT:    MMT Right eval Left eval R / L 4/8 Right 6/3   Left 6/3  Hip flexion 40.6 37.1 21.1 / 36.1 34.7 39.8  Hip extension       Hip abduction 27.4 26.7 25.9 / 20.4 29.7 32.8  Hip adduction       Hip internal rotation       Hip external rotation       Knee flexion       Knee extension 47.1 29.3 29.4/ 30.2 31.0 27.5  Ankle dorsiflexion       Ankle plantarflexion       Ankle inversion       Ankle eversion        (Blank rows = not tested)   FUNCTIONAL TESTS:  30 s STS 4 from pool bench with ue assist (norm 10-14 ) Timed up and go (TUG): 14.59 4 stage balance: passed 1&2; Tandem x 10s (unsteady); SLS x 6s     07/31/23   30s STS from pool bench: 5   TUG 13.51   4 stage balance: SLS: 14s    09/04/2023   30 sec STS test:  6.5 reps with UE support toward the end  of the test TUG:  8.86 sec  09/25/2023 30 sec sit to stand test:  6.5 reps without UE support except on the last rep       GAIT: Pt had no limp with gait.    TREATMENT   10/22/2023 Seated lumbar flexion stretch Bridges 2x10 90/90 marches 4x5 ea STS 3x10 Sidestepping at rail x3 laps Standing hip abduction 2x10bil Standing hip extension 2x10bil  10/08/2023 Seated lumbar flexion stretch Attempted child's pose, but hurt knees Bridges 2x10 90/90 marches 4x5 ea Supine SLR with TrA x10ea STS 2x10 Sidestepping at rail x3 laps Standing hip abduction x10bil Standing hip extension  x10bil     09/25/2023 Reviewed current function, pt presentation, and pain levels.  Reviewed goals and pt progress/deficits.   Assessed strength and 30 sec sit to stand test. Reviewed HEP.  See below for pt education.    PATIENT EDUCATION:  Education details: POC, HEP, objective findings, and goal progress.   Person educated: Patient Education method: Explanation, Demonstration, Tactile cues, Verbal cues Education comprehension: verbalized understanding, returned demonstration, verbal cues required, tactile cues required, and needs further education     HOME EXERCISE PROGRAM: Access Code: [email or web address] URL: [email or web address] Date: 08/02/2023 Prepared by: Gellen April Earnie Starring  Exercises - Heel Raises with Counter Support  - 2-3 x daily - 1 sets - 8-10 reps - Supine Bridge  - 1 x daily - 7 x weekly - 2 sets - 10 reps - Sidelying Hip Abduction  - 1 x daily - 7 x weekly - 2 sets - 10 reps - Dead Bug  - 1 x daily - 7 x weekly - 2 sets - 10 reps - Standing with Back Flat Against Wall  - 1 x daily - 7 x weekly - 2 sets - 10 reps  AQUATIC Access Code: [email or web address] URL: [email or web address] Date: 09/06/2023 Prepared by: [facility] - [address] This aquatic home exercise program from [employer] utilizes pictures from land based exercises, but has been adapted prior to lamination and issuance.    ASSESSMENT:  CLINICAL IMPRESSION: Good tolerance for therex progressions today overall. She does fatigue and require rest breaks between sets. Significantly challenged by supine core strengthening. Improvement noted with sit to stands compared to previous sessions. Pt able to tolerate increased repetitions with standing hip abd/ext. Did fatigue in bilateral thighs by end of session.  Pt has become gym member and is seeing Systems analyst. She is likley to d/c next visit if goals check out.   PN: Pt is improving with pain and sx's as evidenced  by subjective reports.  Pt reports a 45% improvement in pain and states she feels much better.  She hasn't had the major back attacks  recently.  Pt is able to perform activities around her home with less pain including cleaning and cooking.  Pt has improved standing tolerance.  Pt has completed aquatic therapy and wants to transition to land based therapy.  PT assessed LE strength.  She has improved strength in bilat hip abduction and has weakness in bilat quads.  Pt has improved with 5x STS test overall though no improvement since prior testing.  Pt demonstrated clinically significant improvement in self perceived disability with modified oswestry improving from 33% to 20%.  Pt has met all STG's and LTG #6 prior.  She met LTG #1 and partially met #3.  Pt may benefit from transitioning to land based therapy to address ongoing goals, improve core and LE strength, and improve function.  OBJECTIVE IMPAIRMENTS: Abnormal gait, decreased activity tolerance, decreased balance, impaired flexibility, and pain.   ACTIVITY LIMITATIONS: sleeping, stairs, transfers, and locomotion level  PARTICIPATION LIMITATIONS: shopping and community activity  PERSONAL FACTORS: Fitness are also affecting patient's functional outcome.   REHAB POTENTIAL: Good  CLINICAL DECISION MAKING: Evolving/moderate complexity  EVALUATION COMPLEXITY: Moderate   GOALS: Goals reviewed with patient? Yes  SHORT TERM GOALS: Target date: 07/07/23  Pt will tolerate full aquatic sessions consistently without increase in pain and with improving function to demonstrate good toleration and effectiveness of intervention.  Baseline: 07/10/23: reports improving tolerance w/ aquatics Goal status: MET  2. Pt will report walking x 15 minutes 5/7 days a week to demonstrate committment to increasing her physical activity Baseline: walking 30 min (on walking pad)  Goal status: MET - 07/17/23  3.  Pt will report a reduction of LBP by at  least 25% Baseline: see chart 07/10/23: continues to endorse fluctuations 07/25/23: 40% Goal status: Met 07/25/23  4.  Pt will tolerate land based exercises without adverse effects for improved tolerance to activity, strength, and functional mobility.   Goal status:  INITIAL  Target date:  10/16/2023    LONG TERM GOALS: Target date: 11/06/23  Pt to improve on ODI by  20 % to demonstrate statistically significant Improvement in function. Baseline: 33% Goal status: GOAL MET  6/3  2.  Pt will be indep with final HEP's (land and aquatic as appropriate) for continued management of condition Baseline:  Goal status: PROGRESSING  3.  Pt will report decrease in pain by at least 75% for improved toleration to activity/quality of life and to demonstrate improved management of pain.  Baseline: 60% MET   09/25/23 Goal status: In progress  4.  Pt will improve strength in LE up towards 10lbs to demonstrate improved overall physical function Baseline: see chart Goal status: NOT MET   5.  Pt will improve on 30s STS test to <or= 8 to demonstrate improving functional lower extremity strength, transitional movements, and balance Baseline: 4 on eval ; 5 07/31/23 ; 6.5  on 5/13 and 6/3 Goal status: In progress  6.  Pt will improve on Tug test to <or=13s (norm) to demonstrate improvement in lower extremity function, mobility and decreased fall risk. Baseline: 14.59; 13.51 (07/31/23) ; 8.66 (09/04/23) Goal status:  GOAL MET  5/13  PLAN:  PT FREQUENCY: 1-2x/wk  PT DURATION: 4-6 weeks  PLANNED INTERVENTIONS: 02835- PT Re-evaluation, 97110-Therapeutic exercises, 97530- Therapeutic activity, 97112- Neuromuscular re-education, 97535- Self Care, 02859- Manual therapy, [phone]- Gait training, [phone]- Orthotic Fit/training, [phone]- Aquatic Therapy, [phone]- Ionotophoresis 4mg /ml Dexamethasone , Patient/Family education, Balance training, Stair training, Taping, Dry Needling, Joint mobilization, Cryotherapy, and Moist  heat.  PLAN FOR NEXT SESSION:  land based intervention for added load bearing strengthening and final HEP for continued management of chronic condition  Asberry Rodes, PTA  10/22/23 5:28 PM

## 2023-10-24 DIAGNOSIS — H26492 Other secondary cataract, left eye: Secondary | ICD-10-CM | POA: Diagnosis not present

## 2023-10-29 ENCOUNTER — Encounter: Payer: Self-pay | Admitting: Family

## 2023-10-29 ENCOUNTER — Other Ambulatory Visit: Payer: Self-pay | Admitting: Hematology & Oncology

## 2023-10-29 ENCOUNTER — Encounter (HOSPITAL_BASED_OUTPATIENT_CLINIC_OR_DEPARTMENT_OTHER): Admitting: Physical Therapy

## 2023-10-29 DIAGNOSIS — L309 Dermatitis, unspecified: Secondary | ICD-10-CM | POA: Diagnosis not present

## 2023-10-29 DIAGNOSIS — L718 Other rosacea: Secondary | ICD-10-CM | POA: Diagnosis not present

## 2023-10-30 ENCOUNTER — Encounter: Payer: Self-pay | Admitting: Family

## 2023-11-02 ENCOUNTER — Ambulatory Visit (HOSPITAL_BASED_OUTPATIENT_CLINIC_OR_DEPARTMENT_OTHER)

## 2023-11-05 ENCOUNTER — Other Ambulatory Visit: Payer: Self-pay | Admitting: Hematology & Oncology

## 2023-11-05 ENCOUNTER — Encounter (INDEPENDENT_AMBULATORY_CARE_PROVIDER_SITE_OTHER): Payer: Self-pay | Admitting: Internal Medicine

## 2023-11-05 ENCOUNTER — Ambulatory Visit (INDEPENDENT_AMBULATORY_CARE_PROVIDER_SITE_OTHER): Admitting: Internal Medicine

## 2023-11-05 VITALS — BP 121/76 | HR 75 | Temp 98.1°F | Ht 68.0 in | Wt 223.0 lb

## 2023-11-05 DIAGNOSIS — E1169 Type 2 diabetes mellitus with other specified complication: Secondary | ICD-10-CM | POA: Diagnosis not present

## 2023-11-05 DIAGNOSIS — I10 Essential (primary) hypertension: Secondary | ICD-10-CM

## 2023-11-05 DIAGNOSIS — Z6833 Body mass index (BMI) 33.0-33.9, adult: Secondary | ICD-10-CM

## 2023-11-05 DIAGNOSIS — Z7985 Long-term (current) use of injectable non-insulin antidiabetic drugs: Secondary | ICD-10-CM | POA: Diagnosis not present

## 2023-11-05 DIAGNOSIS — G4733 Obstructive sleep apnea (adult) (pediatric): Secondary | ICD-10-CM | POA: Diagnosis not present

## 2023-11-05 DIAGNOSIS — E66811 Obesity, class 1: Secondary | ICD-10-CM

## 2023-11-05 DIAGNOSIS — R1310 Dysphagia, unspecified: Secondary | ICD-10-CM

## 2023-11-05 NOTE — Assessment & Plan Note (Signed)
 She has an esophageal stricture requiring regular dilation and recently underwent dilation. She is currently on a soft diet and was advised to chew food thoroughly to a mashed potato consistency. We informed her that surgery is the last option and to report symptoms of food getting stuck. She will continue regular esophageal dilations as needed, maintain a soft diet and chew food thoroughly, and monitor for symptoms of food getting stuck, reporting to a thoracic surgeon as necessary.  She has a nisin fundoplication.She has an esophageal stricture requiring regular dilation with history of a nissen fundoplication.  She she relies on liquid proteins to make up her daily protein intake requirements.  Continue with current diet.

## 2023-11-05 NOTE — Assessment & Plan Note (Signed)
 Lost four pounds since last visit, following category two plan 70-80% of the time. Tracking calories, eating more whole foods, maintaining hydration, and not skipping meals. Exercising twice a week for 30 minutes, primarily cardio. Sleeping 7-9 hours per night, low stress levels. Despite son's recent hospitalization, continues weight loss journey at a healthy pace of one pound per week. Discussed increasing Mounjaro  dose to 7.5 mg to aid weight loss and diabetes management, with option to revert if nausea occurs. - Continue current dietary plan - Increase exercise frequency and incorporate low-impact exercises such as chair yoga and upright cardio - Increase Mounjaro  dose to 7.5 mg - Ensure adequate hydration and fiber intake to prevent constipation - Follow up in 4 weeksShe has been actively engaged in a medical weight management program, losing 33 pounds overall, with recent minimal weight loss likely due to increased muscle mass from exercise and weight lifting. Her body composition is improving with increased muscle mass and decreased body fat, contributing to a stronger and healthier state. The focus is on body composition changes rather than weight alone. - Continue current exercise regimen focusing on weight lifting to maintain muscle mass. -Continue Mounjaro  for type 2 diabetes and weight management she will be starting the 10 mg dose now. - Monitor body composition changes rather than focusing solely on weight. - Educate on the importance of protein intake, aiming for at least 90 grams per day or 30 grams per meal. - Discuss the thermic effect of food and encourage consumption of natural protein sources like meat, beans, eggs, dairy, and [place] yogurt.

## 2023-11-05 NOTE — Assessment & Plan Note (Signed)
 Blood pressure close to goal.  She is currently on triamterene  hydrochlorothiazide .  Continue current regimen monitor for orthostasis while she loses weight and we go up on the Mounjaro .

## 2023-11-05 NOTE — Assessment & Plan Note (Signed)
 Recent A1c 6.2%, within target range.  Will be increase Mounjaro  to 7.5 mg to aid in weight management.

## 2023-11-05 NOTE — Progress Notes (Signed)
 Office: [phone]  /  Fax: [phone]  Weight Summary and Body Composition Analysis (BIA)  Vitals Temp: 98.1 F (36.7 C) BP: 121/76 Pulse Rate: 75 SpO2: 96 %   Anthropometric Measurements Height: 5' 8 (1.727 m) Weight: 223 lb (101.2 kg) BMI (Calculated): 33.91 Weight at Last Visit: 224 lb Weight Lost Since Last Visit: 1 lb Weight Gained Since Last Visit: 0 lb Starting Weight: 256 lb Total Weight Loss (lbs): 33 lb (15 kg) Peak Weight: 259 lb   Body Composition  Body Fat %: 44.9 % Fat Mass (lbs): 100.2 lbs Muscle Mass (lbs): 116.6 lbs Total Body Water (lbs): 80.6 lbs Visceral Fat Rating : 14    RMR: 1771  Today's Visit #: 12  Starting Date: 12/12/22   Subjective   Chief Complaint: Obesity  Interval History Discussed the use of AI scribe software for clinical note transcription with the patient, who gave verbal consent to proceed.  History of Present Illness   Jennifer [name] is a 73 year old female who presents for medical weight management.  She has lost one pound and has been engaging in regular exercise, particularly weight training over the last two to three weeks. She feels that her body composition has improved, noting looser-fitting clothes, which she attributes to a decrease in body fat and an increase in muscle mass. She feels stronger and healthier as a result.  She is currently on Mounjaro  for appetite control and is starting a new dose of 10 mg today. She is managing well with appetite control. She has been consuming protein shakes and eating soft proteins like salmon and eggs due to swallowing difficulties. She inquires about the benefits of consuming protein in its natural state versus protein shakes.       Challenges affecting patient progress: medical comorbidities and menopause.    Pharmacotherapy for weight management: She is currently taking Monjauro with diabetes as the primary indication with adequate clinical response  and without  side effects..   Assessment and Plan   Treatment Plan For Obesity:  Recommended Dietary Goals  [name] is currently in the action stage of change. As such, her goal is to continue weight management plan. She has agreed to: continue current plan  Behavioral Health and Counseling  We discussed the following behavioral modification strategies today: continue to work on maintaining a reduced calorie state, getting the recommended amount of protein, incorporating whole foods, making healthy choices, staying well hydrated and practicing mindfulness when eating..  Additional education and resources provided today: None  Recommended Physical Activity Goals  [name] has been advised to work up to 150 minutes of moderate intensity aerobic activity a week and strengthening exercises 2-3 times per week for cardiovascular health, weight loss maintenance and preservation of muscle mass.   She has agreed to :  continue to gradually increase the amount and intensity of exercise routine and discussed changing exercise routine to avoid adaptation plateau or training plateau  Medical Interventions and Pharmacotherapy  We discussed various medication options to help [name] with her weight loss efforts and we both agreed to : Adequate clinical response to anti-obesity medication, continue current regimen  Associated Conditions Impacted by Obesity Treatment  Assessment & Plan Essential hypertension Blood pressure close to goal.  She is currently on triamterene  hydrochlorothiazide .  Continue current regimen monitor for orthostasis while she loses weight and we go up on the Mounjaro . OSA on CPAP On CPAP with reported good compliance. Continue PAP therapy. Losing 15% or more of body  weight may improve AHI.  Patient will continue GLP-1 therapy for diabetes management as well as assist with her obesity and associated comorbid conditions.  Type 2 diabetes mellitus with other specified complication, without  long-term current use of insulin  (HCC) Recent A1c 6.2%, within target range.  Will be increase Mounjaro  to 7.5 mg to aid in weight management. Dysphagia, unspecified type She has an esophageal stricture requiring regular dilation and recently underwent dilation. She is currently on a soft diet and was advised to chew food thoroughly to a mashed potato consistency. We informed her that surgery is the last option and to report symptoms of food getting stuck. She will continue regular esophageal dilations as needed, maintain a soft diet and chew food thoroughly, and monitor for symptoms of food getting stuck, reporting to a thoracic surgeon as necessary.  She has a nisin fundoplication.She has an esophageal stricture requiring regular dilation with history of a nissen fundoplication.  She she relies on liquid proteins to make up her daily protein intake requirements.  Continue with current diet. Class 1 obesity with serious comorbidity and body mass index (BMI) of 33.0 to 33.9 in adult, unspecified obesity type Lost four pounds since last visit, following category two plan 70-80% of the time. Tracking calories, eating more whole foods, maintaining hydration, and not skipping meals. Exercising twice a week for 30 minutes, primarily cardio. Sleeping 7-9 hours per night, low stress levels. Despite son's recent hospitalization, continues weight loss journey at a healthy pace of one pound per week. Discussed increasing Mounjaro  dose to 7.5 mg to aid weight loss and diabetes management, with option to revert if nausea occurs. - Continue current dietary plan - Increase exercise frequency and incorporate low-impact exercises such as chair yoga and upright cardio - Increase Mounjaro  dose to 7.5 mg - Ensure adequate hydration and fiber intake to prevent constipation - Follow up in 4 weeksShe has been actively engaged in a medical weight management program, losing 33 pounds overall, with recent minimal weight loss  likely due to increased muscle mass from exercise and weight lifting. Her body composition is improving with increased muscle mass and decreased body fat, contributing to a stronger and healthier state. The focus is on body composition changes rather than weight alone. - Continue current exercise regimen focusing on weight lifting to maintain muscle mass. -Continue Mounjaro  for type 2 diabetes and weight management she will be starting the 10 mg dose now. - Monitor body composition changes rather than focusing solely on weight. - Educate on the importance of protein intake, aiming for at least 90 grams per day or 30 grams per meal. - Discuss the thermic effect of food and encourage consumption of natural protein sources like meat, beans, eggs, dairy, and [place] yogurt.          Objective   Physical Exam:  Blood pressure 121/76, pulse 75, temperature 98.1 F (36.7 C), height 5' 8 (1.727 m), weight 223 lb (101.2 kg), SpO2 96%. Body mass index is 33.91 kg/m.  General: She is overweight, cooperative, alert, well developed, and in no acute distress. PSYCH: Has normal mood, affect and thought process.   HEENT: EOMI, sclerae are anicteric. Lungs: Normal breathing effort, no conversational dyspnea. Extremities: No edema.  Neurologic: No gross sensory or motor deficits. No tremors or fasciculations noted.    Diagnostic Data Reviewed:  BMET    Component Value Date/Time   NA 141 10/17/2023 1442   NA 143 12/12/2022 1100   NA 143 02/16/2017 1450  NA 142 01/19/2016 1141   K 4.0 10/17/2023 1442   K 3.7 02/16/2017 1450   K 3.7 01/19/2016 1141   CL 104 10/17/2023 1442   CL 105 02/16/2017 1450   CO2 29 10/17/2023 1442   CO2 32 02/16/2017 1450   CO2 27 01/19/2016 1141   GLUCOSE 91 10/17/2023 1442   GLUCOSE 88 02/16/2017 1450   BUN 22 10/17/2023 1442   BUN 17 12/12/2022 1100   BUN 14 02/16/2017 1450   BUN 19.5 01/19/2016 1141   CREATININE 1.04 (H) 10/17/2023 1442   CREATININE 1.2  02/16/2017 1450   CREATININE 0.9 01/19/2016 1141   CALCIUM  9.6 10/17/2023 1442   CALCIUM  8.8 02/16/2017 1450   CALCIUM  8.8 01/19/2016 1141   GFRNONAA 58 (L) 10/17/2023 1442   GFRAA >60 12/25/2019 1353   Lab Results  Component Value Date   HGBA1C 6.2 (H) 06/08/2023   HGBA1C 6.2 (H) 10/14/2007   Lab Results  Component Value Date   INSULIN  20.4 12/12/2022   INSULIN  12.6 05/19/2021   Lab Results  Component Value Date   TSH 1.82 07/24/2023   CBC    Component Value Date/Time   WBC 4.4 10/17/2023 1442   WBC 4.5 03/12/2023 0721   RBC 4.28 10/17/2023 1442   HGB 12.2 10/17/2023 1442   HGB 12.1 02/16/2017 1450   HCT 36.7 10/17/2023 1442   HCT 36.6 02/16/2017 1450   PLT 220 10/17/2023 1442   PLT 239 02/16/2017 1450   MCV 85.7 10/17/2023 1442   MCV 87 02/16/2017 1450   MCH 28.5 10/17/2023 1442   MCHC 33.2 10/17/2023 1442   RDW 15.6 (H) 10/17/2023 1442   RDW 15.4 02/16/2017 1450   Iron Studies    Component Value Date/Time   IRON 59 12/05/2021 1451   IRON 49 02/16/2017 1536   TIBC 323 12/05/2021 1451   TIBC 284 02/16/2017 1536   FERRITIN 51 12/05/2021 1452   FERRITIN 66 02/16/2017 1536   IRONPCTSAT 18 12/05/2021 1451   IRONPCTSAT 17 (L) 02/16/2017 1536   Lipid Panel     Component Value Date/Time   CHOL 115 06/08/2023 1422   TRIG 64 06/08/2023 1422   HDL 67 06/08/2023 1422   CHOLHDL 1.7 06/08/2023 1422   VLDL 9.8 06/05/2022 1320   LDLCALC 34 06/08/2023 1422   Hepatic Function Panel     Component Value Date/Time   PROT 7.3 10/17/2023 1442   PROT 7.1 12/12/2022 1100   PROT 7.3 02/16/2017 1450   PROT 7.3 01/19/2016 1141   ALBUMIN 4.4 10/17/2023 1442   ALBUMIN 4.3 12/12/2022 1100   ALBUMIN 3.6 01/19/2016 1141   AST 29 10/17/2023 1442   AST 21 01/19/2016 1141   ALT 31 10/17/2023 1442   ALT 27 02/16/2017 1450   ALT 18 01/19/2016 1141   ALKPHOS 51 10/17/2023 1442   ALKPHOS 57 02/16/2017 1450   ALKPHOS 59 01/19/2016 1141   BILITOT 0.5 10/17/2023 1442    BILITOT 0.31 01/19/2016 1141   BILIDIR 0.0 01/08/2012 1224   IBILI 0.2 12/30/2010 1515      Component Value Date/Time   TSH 1.82 07/24/2023 1436   Nutritional Lab Results  Component Value Date   VD25OH 61.5 12/12/2022   VD25OH 42.2 05/19/2021    Medications: Outpatient Encounter Medications as of 11/05/2023  Medication Sig   acetaminophen  (TYLENOL ) 500 MG tablet Take 1 tablet (500 mg total) by mouth every 6 (six) hours as needed. (Patient taking differently: Take 500 mg by mouth every 6 (  six) hours as needed for moderate pain (pain score 4-6), mild pain (pain score 1-3) or headache.)   Apoaequorin (PREVAGEN) 10 MG CAPS Take 10 mg by mouth daily. prevagen   cycloSPORINE  (RESTASIS ) 0.05 % ophthalmic emulsion Place 1 drop into both eyes 2 (two) times daily.   escitalopram  (LEXAPRO ) 10 MG tablet Take 1 tablet (10 mg total) by mouth daily.   hydrocortisone  valerate cream (WESTCORT ) 0.2 % Apply 1 Application topically daily as needed (itching).   hyoscyamine  (LEVSIN ) 0.125 MG tablet Take 1 tablet (0.125 mg total) by mouth every 4 hours as needed.   Multiple Vitamins-Minerals (MULTIVITAMIN WITH MINERALS) tablet Take 2 tablets by mouth daily. Vitafusion woman gummy   NEXIUM  40 MG capsule Take 1 capsule (40 mg total) by mouth daily before breakfast.   olopatadine (PATADAY) 0.1 % ophthalmic solution Place 1 drop into the left eye 2 (two) times daily. (Patient taking differently: Place 1 drop into the left eye 2 (two) times daily.)   rosuvastatin  (CRESTOR ) 10 MG tablet Take 1 tablet (10 mg total) by mouth at bedtime.   temazepam  (RESTORIL ) 30 MG capsule TAKE ONE CAPSULE BY MOUTH AT BEDTIME AS NEEDED F SLEEP   tirzepatide  (MOUNJARO ) 10 MG/0.5ML Pen Inject 10 mg into the skin once a week.   triamterene -hydrochlorothiazide  (MAXZIDE -25) 37.5-25 MG tablet Take 0.5 tablets by mouth daily.   XARELTO  2.5 MG TABS tablet TAKE TWO TABLETS BY MOUTH DAILY   No facility-administered encounter medications on  file as of 11/05/2023.     Follow-Up   Return in about 4 weeks (around 12/03/2023) for For Weight Mangement with Dr. Francyne.SABRA She was informed of the importance of frequent follow up visits to maximize her success with intensive lifestyle modifications for her multiple health conditions.  Attestation Statement   Reviewed by clinician on day of visit: allergies, medications, problem list, medical history, surgical history, family history, social history, and previous encounter notes.     Lucas Francyne, MD

## 2023-11-05 NOTE — Assessment & Plan Note (Signed)
 On CPAP with reported good compliance. Continue PAP therapy. Losing 15% or more of body weight may improve AHI.  Patient will continue GLP-1 therapy for diabetes management as well as assist with her obesity and associated comorbid conditions.

## 2023-11-06 ENCOUNTER — Ambulatory Visit (HOSPITAL_BASED_OUTPATIENT_CLINIC_OR_DEPARTMENT_OTHER): Attending: Orthopedic Surgery | Admitting: Physical Therapy

## 2023-11-06 DIAGNOSIS — M5459 Other low back pain: Secondary | ICD-10-CM | POA: Insufficient documentation

## 2023-11-06 DIAGNOSIS — M6281 Muscle weakness (generalized): Secondary | ICD-10-CM | POA: Diagnosis not present

## 2023-11-06 DIAGNOSIS — R2689 Other abnormalities of gait and mobility: Secondary | ICD-10-CM | POA: Insufficient documentation

## 2023-11-06 NOTE — Therapy (Signed)
 OUTPATIENT PHYSICAL THERAPY THORACOLUMBAR TREATMENT      Patient Name: Jennifer [name] MRN: [id number] DOB:08-08-1950, 73 y.o., female Today's Date: 11/06/2023  END OF SESSION:         Past Medical History:  Diagnosis Date   Allergy    Anemia    Arthritis    Cataract    bil cataracts removed   Chronic chest pain    Chronic fatigue 02/03/2017   Clotting disorder [facility])    Pulmonary Embolis 2010 & 2015   Colon polyps    inflamed partially ulcerated hyperplastic polyp   Diabetes (HCC) 09/24/2013   no meds   Diverticulosis    Dysphagia esophageal phase - chronic after 3 fundoplications 01/20/2014   Edema of both lower extremities    Esophageal stenosis    Stricture after fundoplication REDO   Fibromyalgia    GERD (gastroesophageal reflux disease)    Glaucoma suspect    Heart murmur    no problems    Hemorrhoids, internal, with bleeding 06/27/2017   History of hiatal hernia    Hypertension    IBS (irritable bowel syndrome)    Lactose intolerance    Mitral valve prolapse    OSA (obstructive sleep apnea)    on CPAP-Mild - Sleep study 2004   Pre-diabetes    Pulmonary embolus (HCC)    2010 and 12-2013   Pulmonary nodule 12/13/2022   seen on CT chest xray on 12/16/22 - left upper lobe   Sleep apnea    wears CPAP   Thyroid  nodule      dr. watching    Tinnitus    Subjective   Zoster 2014   R flank   Past Surgical History:  Procedure Laterality Date   ABDOMINAL HYSTERECTOMY     still has 1 ovary    APPENDECTOMY     BRAIN SURGERY  1995   Stem surgery, Arnold Chiari Malformation   CATARACT EXTRACTION Bilateral 2017   cataracts, Dr. Cleatus   CHOLECYSTECTOMY     COLONOSCOPY     ESOPHAGEAL DILATION N/A 02/06/2023   Procedure: ESOPHAGEAL DILATION;  Surgeon: Shyrl Linnie KIDD, MD;  Location: [facility] OR;  Service: Thoracic;  Laterality: N/A;   ESOPHAGEAL DILATION N/A 02/13/2023   Procedure: ESOPHAGEAL DILATION;  Surgeon: Shyrl Linnie KIDD, MD;  Location: [facility] OR;   Service: Thoracic;  Laterality: N/A;   ESOPHAGEAL DILATION N/A 03/12/2023   Procedure: ESOPHAGEAL DILATION;  Surgeon: Shyrl Linnie KIDD, MD;  Location: [facility] OR;  Service: Thoracic;  Laterality: N/A;   ESOPHAGEAL MANOMETRY N/A 08/06/2019   Procedure: ESOPHAGEAL MANOMETRY (EM);  Surgeon: Avram Lupita BRAVO, MD;  Location: [facility] ENDOSCOPY;  Service: Endoscopy;  Laterality: N/A;   ESOPHAGOGASTRODUODENOSCOPY N/A 02/06/2023   Procedure: ESOPHAGOGASTRODUODENOSCOPY (EGD);  Surgeon: Shyrl Linnie KIDD, MD;  Location: [facility] OR;  Service: Thoracic;  Laterality: N/A;  With Savary dilation   ESOPHAGOGASTRODUODENOSCOPY N/A 02/13/2023   Procedure: ESOPHAGOGASTRODUODENOSCOPY (EGD);  Surgeon: Shyrl Linnie KIDD, MD;  Location: [facility] OR;  Service: Thoracic;  Laterality: N/A;   ESOPHAGOGASTRODUODENOSCOPY N/A 03/12/2023   Procedure: ESOPHAGOGASTRODUODENOSCOPY (EGD);  Surgeon: Shyrl Linnie KIDD, MD;  Location: [facility] OR;  Service: Thoracic;  Laterality: N/A;   HEMORRHOID BANDING     HERNIA REPAIR     Hital Hernia   JOINT REPLACEMENT Right    knee   KNEE ARTHROSCOPY  05/16/2012   Procedure: ARTHROSCOPY KNEE;  Surgeon: Rome JULIANNA Pepper, MD;  Location: [facility] OR;  Service: Orthopedics;  Laterality: Left;   KNEE ARTHROSCOPY Right 01/2018  NISSEN FUNDOPLICATION     X 3 lab, open x 2 last with mesh   POLYPECTOMY     REDUCTION MAMMAPLASTY Bilateral 2007   TOTAL KNEE ARTHROPLASTY Right 08/25/2019   Procedure: RIGHT TOTAL KNEE ARTHROPLASTY;  Surgeon: Melodi Lerner, MD;  Location: [facility] ORS;  Service: Orthopedics;  Laterality: Right;    UPPER GASTROINTESTINAL ENDOSCOPY     Patient Active Problem List   Diagnosis Date Noted   Vitamin D  deficiency 12/12/2022   Decreased GFR 12/12/2022   Physically inactive 12/12/2022   Calculus of kidney 07/14/2021   OSA on CPAP 06/08/2021   Circadian rhythm sleep disorder, delayed sleep phase type 06/08/2021   Glaucoma suspect 06/25/2020   S/P total knee arthroplasty, right 08/27/2019    OA (osteoarthritis) of knee 08/25/2019   Primary osteoarthritis of right knee 08/25/2019   Atypical chest pain    Hemorrhoids, internal, with bleeding 06/27/2017   IDA (iron deficiency anemia) 02/20/2017   Chronic fatigue 02/03/2017   Left leg pain 11/29/2016   History of colonic polyps 12/02/2015   PCP NOTES >>>>>>>>>>>>>>>>>>>>>>>>> 02/13/2015   Anxiety 08/04/2014   Annual physical exam 04/29/2014   Fibromyalgia syndrome 02/04/2014   Dysphagia 01/20/2014   Chronic cough 01/08/2014   DOE (dyspnea on exertion) 12/26/2013   Diabetes (HCC) 09/24/2013   Abdominal pain, chronic, right flank -upper quadrant 07/02/2012   Class 1 obesity with serious comorbidity and body mass index (BMI) of 33.0 to 33.9 in adult 09/30/2009   Hypersomnia with sleep apnea 09/28/2009   Essential hypertension 06/24/2009   DIZZINESS 06/24/2009   HEADACHE 06/24/2009   VENOUS INSUFFICIENCY, LEGS 11/19/2008   Acute pulmonary embolism (HCC) 05/19/2008   Pulmonary nodule 08/26/2007   Stricture and stenosis of esophagus 05/22/2007   GERD 01/31/2007   IBS (irritable bowel syndrome) 01/31/2007   MITRAL VALVE PROLAPSE, HX OF 01/31/2007    PCP: Aloysius Mech MD  REFERRING PROVIDER: Burnetta Aures, MD   REFERRING DIAG: M54.50 (ICD-10-CM) - Low back pain   Rationale for Evaluation and Treatment: Rehabilitation  THERAPY DIAG:  No diagnosis found.  ONSET DATE: Last 2 yrs  SUBJECTIVE:                                                                                                                                                                                           SUBJECTIVE STATEMENT: Pt has joined [employer].  Pt performs her pool program 1x/wk and works with a Psychologist, educational 1x/wk.  Pt has used the TM and seated row machine and performs strengthening.  Pt reports compliance with HEP.  Pt reports a 75% decrease in pain.  Pt states she  had increased pain after prior Rx though   PN: Pt states she has finished aquatic  therapy and has an aquatic program.  Pt states she is a lot better.  Pt hasn't had the major back attacks  recently.  Pt is able to perform activities around her home with less pain.  Pt is able to lie on back without pain.  Her pain has improved with standing to perform household chores including cleaning and cooking.  Pt has improved tolerance with cleaning home.  Pt reports a 45% improvement in pain.  Pt states her back is hurting today because she did a lot of yard work this weekend.  Pt states she has pain and difficulty with lifting objects.   Pt has set an alarm to get up every 30 mins while she is on the computer.  Pt reports compliance with HEP.  Pt plans to join [employer] and use the pool.     PERTINENT HISTORY:   Lumbar spondylosis R TKR in 2021, L knee arthroscopy in 2014, DM type 2, Fibromyalgia   PAIN:  Are you having pain? Yes: NPRS scale: currently 2/10 current, 5/10 worst, 1.5-2/10 best Pain location:  central lumbar Pain description: ache Aggravating factors: sitting >30 minutes, lying on left side Relieving factors: heating pad, pain patches, tylenol   PRECAUTIONS: None  RED FLAGS: None   WEIGHT BEARING RESTRICTIONS: No  FALLS:  Has patient fallen in last 6 months? No   OCCUPATION: retired; Clinical cytogeneticist  PLOF: Independent  PATIENT GOALS: improve strength, get back into exercise.  NEXT MD VISIT: as needed  OBJECTIVE:  Note: Objective measures were completed at Evaluation unless otherwise noted.  DIAGNOSTIC FINDINGS:  Imaging from today does demonstrate mild degenerative disc disease at L4-L5 and L5-S1   PATIENT SURVEYS:  Modified Oswestry Initial/Current:  Prior/Current:  20% / 12%   COGNITION: Overall cognitive status: Within functional limits for tasks assessed     SENSATION: WFL  MUSCLE LENGTH: Hamstrings: Bilateral hamstring    POSTURE: rounded shoulders, forward head, and left knee valgus  PALPATION: Center l4-5; L5-S1  LUMBAR ROM:   wfl  LOWER EXTREMITY ROM:     wfl  LOWER EXTREMITY MMT:    MMT Right eval Left eval R / L 4/8 Right 6/3   Left 6/3  Hip flexion 40.6 37.1 21.1 / 36.1 34.7 39.8  Hip extension       Hip abduction 27.4 26.7 25.9 / 20.4 29.7 32.8  Hip adduction       Hip internal rotation       Hip external rotation       Knee flexion       Knee extension 47.1 29.3 29.4/ 30.2 31.0 27.5  Ankle dorsiflexion       Ankle plantarflexion       Ankle inversion       Ankle eversion        (Blank rows = not tested)   FUNCTIONAL TESTS:  30 s STS 4 from pool bench with ue assist (norm 10-14 ) Timed up and go (TUG): 14.59 4 stage balance: passed 1&2; Tandem x 10s (unsteady); SLS x 6s     07/31/23   30s STS from pool bench: 5   TUG 13.51   4 stage balance: SLS: 14s    09/04/2023   30 sec STS test:  6.5 reps with UE support toward the end of the test TUG:  8.86 sec  09/25/2023 30 sec sit to stand test:  6.5 reps  without UE support except on the last rep  11/06/2023 30 sec sit to stand test:  8 reps with occasional UE support       GAIT: Pt had no limp with gait.    TREATMENT   11/06/2023 Pt performed 30 sec STS test.  Pt completed modified Oswestry.  See above.  Reviewed pt presentation, pain levels, HEP compliance, and response to prior Rx. PT instructed pt in what machines in the gym to avoid.   Sidestepping x 3 laps at rail Seated LF Row 20# x10 LF knee extension 10# 2x10    10/22/2023 Seated lumbar flexion stretch Bridges 2x10 90/90 marches 4x5 ea STS 3x10 Sidestepping at rail x3 laps Standing hip abduction 2x10bil Standing hip extension 2x10bil  10/08/2023 Seated lumbar flexion stretch Attempted child's pose, but hurt knees Bridges 2x10 90/90 marches 4x5 ea Supine SLR with TrA x10ea STS 2x10 Sidestepping at rail x3 laps Standing hip abduction x10bil Standing hip extension x10bil     09/25/2023 Reviewed current function, pt presentation, and pain levels.   Reviewed goals and pt progress/deficits.   Assessed strength and 30 sec sit to stand test. Reviewed HEP.  See below for pt education.    PATIENT EDUCATION:  Education details: POC, HEP, objective findings, and goal progress.   Person educated: Patient Education method: Explanation, Demonstration, Tactile cues, Verbal cues Education comprehension: verbalized understanding, returned demonstration, verbal cues required, tactile cues required, and needs further education     HOME EXERCISE PROGRAM: Access Code: [email or web address] URL: [email or web address] Date: 08/02/2023 Prepared by: Gellen April Earnie Starring  Exercises - Heel Raises with Counter Support  - 2-3 x daily - 1 sets - 8-10 reps - Supine Bridge  - 1 x daily - 7 x weekly - 2 sets - 10 reps - Sidelying Hip Abduction  - 1 x daily - 7 x weekly - 2 sets - 10 reps - Dead Bug  - 1 x daily - 7 x weekly - 2 sets - 10 reps - Standing with Back Flat Against Wall  - 1 x daily - 7 x weekly - 2 sets - 10 reps  AQUATIC Access Code: [email or web address] URL: [email or web address] Date: 09/06/2023 Prepared by: [facility] [address] This aquatic home exercise program from [employer] utilizes pictures from land based exercises, but has been adapted prior to lamination and issuance.    ASSESSMENT:  CLINICAL IMPRESSION: Good tolerance for therex progressions today overall. She does fatigue and require rest breaks between sets. Significantly challenged by supine core strengthening. Improvement noted with sit to stands compared to previous sessions. Pt able to tolerate increased repetitions with standing hip abd/ext. Did fatigue in bilateral thighs by end of session.  Pt has become gym member and is seeing Systems analyst. She is likley to d/c next visit if goals check out.   30 sec sit to stand test:  8 reps Pt demonstrates improved self perceived disability with modified Oswestry improving rom 20% to  12%.  PN: Pt is improving with pain and sx's as evidenced by subjective reports.  Pt reports a 45% improvement in pain and states she feels much better.  She hasn't had the major back attacks  recently.  Pt is able to perform activities around her home with less pain including cleaning and cooking.  Pt has improved standing tolerance.  Pt has completed aquatic therapy and wants to transition to land based therapy.  PT assessed LE strength.  She has improved strength in bilat hip abduction  and has weakness in bilat quads.  Pt has improved with 5x STS test overall though no improvement since prior testing.  Pt demonstrated clinically significant improvement in self perceived disability with modified oswestry improving from 33% to 20%.  Pt has met all STG's and LTG #6 prior.  She met LTG #1 and partially met #3.  Pt may benefit from transitioning to land based therapy to address ongoing goals, improve core and LE strength, and improve function.     OBJECTIVE IMPAIRMENTS: Abnormal gait, decreased activity tolerance, decreased balance, impaired flexibility, and pain.   ACTIVITY LIMITATIONS: sleeping, stairs, transfers, and locomotion level  PARTICIPATION LIMITATIONS: shopping and community activity  PERSONAL FACTORS: Fitness are also affecting patient's functional outcome.   REHAB POTENTIAL: Good  CLINICAL DECISION MAKING: Evolving/moderate complexity  EVALUATION COMPLEXITY: Moderate   GOALS: Goals reviewed with patient? Yes  SHORT TERM GOALS: Target date: 07/07/23  Pt will tolerate full aquatic sessions consistently without increase in pain and with improving function to demonstrate good toleration and effectiveness of intervention.  Baseline: 07/10/23: reports improving tolerance w/ aquatics Goal status: MET  2. Pt will report walking x 15 minutes 5/7 days a week to demonstrate committment to increasing her physical activity Baseline: walking 30 min (on walking pad)  Goal status: MET  - 07/17/23  3.  Pt will report a reduction of LBP by at least 25% Baseline: see chart 07/10/23: continues to endorse fluctuations 07/25/23: 40% Goal status: Met 07/25/23  4.  Pt will tolerate land based exercises without adverse effects for improved tolerance to activity, strength, and functional mobility.   Goal status:  INITIAL  Target date:  10/16/2023    LONG TERM GOALS: Target date: 11/06/23  Pt to improve on ODI by  20 % to demonstrate statistically significant Improvement in function. Baseline: 33% Goal status: GOAL MET  6/3  2.  Pt will be indep with final HEP's (land and aquatic as appropriate) for continued management of condition Baseline:  Goal status: PROGRESSING  3.  Pt will report decrease in pain by at least 75% for improved toleration to activity/quality of life and to demonstrate improved management of pain.  Baseline: GOAL MET  11/06/23 Goal status: In progress  4.  Pt will improve strength in LE up towards 10lbs to demonstrate improved overall physical function Baseline: see chart Goal status: NOT MET   5.  Pt will improve on 30s STS test to <or= 8 to demonstrate improving functional lower extremity strength, transitional movements, and balance Baseline: 4 on eval ; 5 07/31/23 ; 6.5  on 5/13 and 6/3 ; 8 on  7/15 Goal status: GOAL MET  11/06/23  6.  Pt will improve on Tug test to <or=13s (norm) to demonstrate improvement in lower extremity function, mobility and decreased fall risk. Baseline: 14.59; 13.51 (07/31/23) ; 8.66 (09/04/23) Goal status:  GOAL MET  5/13  PLAN:  PT FREQUENCY: 1-2x/wk  PT DURATION: 4-6 weeks  PLANNED INTERVENTIONS: 02835- PT Re-evaluation, 97110-Therapeutic exercises, 97530- Therapeutic activity, 97112- Neuromuscular re-education, 97535- Self Care, 02859- Manual therapy, [phone]- Gait training, [phone]- Orthotic Fit/training, [phone]- Aquatic Therapy, [phone]- Ionotophoresis 4mg /ml Dexamethasone , Patient/Family education, Balance training, Stair  training, Taping, Dry Needling, Joint mobilization, Cryotherapy, and Moist heat.  PLAN FOR NEXT SESSION:  land based intervention for added load bearing strengthening and final HEP for continued management of chronic condition  PHYSICAL THERAPY DISCHARGE SUMMARY  Visits from Start of Care: 21  Current functional level related to goals / functional outcomes: See above  Remaining deficits: See above   Education / Equipment: See above      Asberry Rodes, PTA  11/06/23 8:44 AM

## 2023-11-07 ENCOUNTER — Encounter (HOSPITAL_BASED_OUTPATIENT_CLINIC_OR_DEPARTMENT_OTHER): Payer: Self-pay | Admitting: Physical Therapy

## 2023-11-15 ENCOUNTER — Other Ambulatory Visit: Payer: Self-pay | Admitting: Gastroenterology

## 2023-11-29 ENCOUNTER — Other Ambulatory Visit (INDEPENDENT_AMBULATORY_CARE_PROVIDER_SITE_OTHER): Payer: Self-pay | Admitting: Internal Medicine

## 2023-11-29 DIAGNOSIS — E1169 Type 2 diabetes mellitus with other specified complication: Secondary | ICD-10-CM

## 2023-11-29 DIAGNOSIS — G4733 Obstructive sleep apnea (adult) (pediatric): Secondary | ICD-10-CM

## 2023-12-03 ENCOUNTER — Encounter (INDEPENDENT_AMBULATORY_CARE_PROVIDER_SITE_OTHER): Payer: Self-pay | Admitting: Internal Medicine

## 2023-12-03 ENCOUNTER — Ambulatory Visit (INDEPENDENT_AMBULATORY_CARE_PROVIDER_SITE_OTHER): Admitting: Internal Medicine

## 2023-12-03 VITALS — BP 103/70 | HR 79 | Temp 98.0°F | Ht 68.0 in | Wt 217.0 lb

## 2023-12-03 DIAGNOSIS — I10 Essential (primary) hypertension: Secondary | ICD-10-CM | POA: Diagnosis not present

## 2023-12-03 DIAGNOSIS — G4733 Obstructive sleep apnea (adult) (pediatric): Secondary | ICD-10-CM

## 2023-12-03 DIAGNOSIS — E66811 Obesity, class 1: Secondary | ICD-10-CM | POA: Diagnosis not present

## 2023-12-03 DIAGNOSIS — Z7985 Long-term (current) use of injectable non-insulin antidiabetic drugs: Secondary | ICD-10-CM | POA: Diagnosis not present

## 2023-12-03 DIAGNOSIS — Z6833 Body mass index (BMI) 33.0-33.9, adult: Secondary | ICD-10-CM | POA: Diagnosis not present

## 2023-12-03 DIAGNOSIS — E1169 Type 2 diabetes mellitus with other specified complication: Secondary | ICD-10-CM | POA: Diagnosis not present

## 2023-12-03 MED ORDER — TIRZEPATIDE 10 MG/0.5ML ~~LOC~~ SOAJ: 10.0000 mg | SUBCUTANEOUS | 1 refills | Status: DC

## 2023-12-03 NOTE — Assessment & Plan Note (Signed)
 She has lost 39 pounds since starting the weight management program, with a recent loss of 6 pounds. She adheres to a 1200 calorie nutrition plan 85% of the time, tracks calories, consumes more whole foods, and meets protein requirements. She exercises twice weekly for 45-60 minutes, increased physical activity with gym sessions and a Systems analyst. Mounjaro  10 mg provides adequate appetite suppression without significant hunger. BMI decreased from 38 to 33. - Continue 1200 calorie nutrition plan - Maintain exercise routine with personal trainer - Continue Mounjaro  10 mg for appetite suppression - Monitor BMI and weight loss progress
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
## 2023-12-03 NOTE — Assessment & Plan Note (Signed)
 Her blood pressure is low normal and it seems that some of her fatigue may be from this.  She is currently taking triamterene  hydrochlorothiazide  half a pill.  Tirzepatide  has been proven to reduce blood pressure so I do not think she will need to continue to take blood pressure medication.  She will discuss this further with her primary care team

## 2023-12-03 NOTE — Assessment & Plan Note (Signed)
 On CPAP with reported good compliance. Continue PAP therapy. Losing 15% or more of body weight may improve AHI.  Patient will continue GLP-1 therapy for diabetes management as well as assist with her obesity and associated comorbid conditions.

## 2023-12-03 NOTE — Assessment & Plan Note (Signed)
 Blood sugar levels have decreased significantly since starting the weight management program. She reports occasional fatigue, but no hypoglycemia. Mounjaro  contributes to blood pressure reduction, possibly causing fatigue. A1c was 6.2 in February. - Discuss with primary care physician the possibility of discontinuing blood pressure medication due to low blood pressure readings - Monitor blood sugar levels, especially when feeling tired - Check A1c levels

## 2023-12-06 ENCOUNTER — Other Ambulatory Visit: Payer: Self-pay

## 2023-12-06 ENCOUNTER — Encounter: Payer: Self-pay | Admitting: Obstetrics and Gynecology

## 2023-12-06 ENCOUNTER — Ambulatory Visit (INDEPENDENT_AMBULATORY_CARE_PROVIDER_SITE_OTHER): Admitting: Obstetrics and Gynecology

## 2023-12-06 VITALS — BP 109/75 | HR 73 | Wt 220.0 lb

## 2023-12-06 DIAGNOSIS — G8929 Other chronic pain: Secondary | ICD-10-CM

## 2023-12-06 DIAGNOSIS — Z1331 Encounter for screening for depression: Secondary | ICD-10-CM | POA: Diagnosis not present

## 2023-12-06 DIAGNOSIS — R102 Pelvic and perineal pain: Secondary | ICD-10-CM

## 2023-12-06 NOTE — Progress Notes (Signed)
 NEW GYNECOLOGY PATIENT Patient name: Jennifer [name] MRN [id number]  Date of birth: [birth year] Chief Complaint:   Pelvic Pain     History:  Jennifer [name] is a 73 y.o. G1P1001 being seen today for dull, constant RLQ pain. Tylenol  helps some. Pain has gotten more intense. No bleeding; hx of hysterectomy in the 1970s, no vaginal discharge. Not sexually active, no vaginal dryness.     Discussed the use of AI scribe software for clinical note transcription with the patient, who gave verbal consent to proceed.  History of Present Illness Jennifer [name] is a 73 year old female who presents with right-sided abdominal pain.  She experiences deep, right-sided abdominal pain, similar to the pain she had during her periods before her hysterectomy. The pain began last year, initially intense, then subsided, but has returned this year with increased severity. It is accompanied by nausea and worsens when lying on her right side. The pain is persistent and not triggered by specific activities. She finds some relief with Tylenol .  She underwent a hysterectomy at the age of 89 or 3 due to severe endometriosis, leaving one ovary and one fallopian tube intact. She is uncertain whether the remaining ovary is on the left or right side, but the current pain is on the right.  She has a history of GERD and requires esophageal dilation every six months due to strictures. She experiences stomach sickness, which she finds difficult to distinguish from the pain she is currently experiencing.  An MRI of her back revealed arthritis in the lower back, for which she underwent rehabilitation. While her back pain improved, the abdominal pain persisted.  She reports no vaginal discharge and is unsure about vaginal dryness due to lack of sexual activity.      Gynecologic History No LMP recorded. Patient has had a hysterectomy. Contraception: status post hysterectomy Last Pap: n/a Last Mammogram: 05/2023 birads 1 Last  Colonoscopy: per pcp  Obstetric History OB History  Gravida Para Term Preterm AB Living  1 1 1  0 0 1  SAB IAB Ectopic Multiple Live Births  0 0 0 0 1    # Outcome Date GA Lbr Len/2nd Weight Sex Type Anes PTL Lv  1 Term [birth year] [redacted]w[redacted]d  10 lb 8 oz (4.763 kg) M Vag-Spont   LIV    The following portions of the patient's history were reviewed and updated as appropriate: allergies, current medications, past family history, past medical history, past social history, past surgical history and problem list.  Review of Systems Pertinent items noted in HPI and remainder of comprehensive ROS otherwise negative.  Physical Exam:  BP 109/75   Pulse 73   Wt 220 lb (99.8 kg)   BMI 33.45 kg/m  Physical Exam Vitals and nursing note reviewed. Exam conducted with a chaperone present.  Constitutional:      Appearance: Normal appearance.  Pulmonary:     Effort: Pulmonary effort is normal.  Abdominal:     Palpations: Abdomen is soft.     Comments: + [place] Minimal left sided abdominal tenderness Right sided suprapbic tenderness Scattered right sided    Genitourinary:    General: Normal vulva.     Exam position: Lithotomy position.     Comments: Normal appearing vulva Normal vulvar sensation bilaterally Nontender superficial pelvic floor muscles Nontender ischial tuberosities bilaterally  Allodynia at introitus: No  Right levator ani 8/10 Right ischiococcygeous 9/10 Right obturator internus 9/10 Left levator ani 5/10 Left ischioccocygeous 6/10 Left obturator internus  7/10    Neurological:     Mental Status: She is alert.      Assessment and Plan:   Assessment & Plan Chronic right lower abdominal/pelvic pain Chronic severe right-sided pelvic pain with nausea, similar to menstrual pain. History of hysterectomy with retained ovary and fallopian tube. Differential includes deep pelvic floor muscle spasm. Unlikely endometriosis due to menopause and age. Pain relief with Tylenol . -  Pelvic exam demonstrates pelvic floor myalgia - Discussed role of pelvic US  for visualization of pelvic structures; would prefer to have this completed prior to trial of PFPT - Has previously taken muscle relaxants but they made her sick - Declines pelvic floor botox/injections (does not like the idea of needles) - Schedule transvaginal ultrasound to evaluate pelvic structures.  Follow-up: No follow-ups on file.      Carter Quarry, MD Obstetrician & Gynecologist, [facility] [employer], [facility]

## 2023-12-07 ENCOUNTER — Ambulatory Visit: Payer: Medicare Other | Admitting: Internal Medicine

## 2023-12-07 ENCOUNTER — Encounter: Payer: Self-pay | Admitting: Internal Medicine

## 2023-12-07 VITALS — BP 128/80 | HR 77 | Temp 97.8°F | Resp 18 | Ht 68.0 in | Wt 223.0 lb

## 2023-12-07 DIAGNOSIS — Z7985 Long-term (current) use of injectable non-insulin antidiabetic drugs: Secondary | ICD-10-CM

## 2023-12-07 DIAGNOSIS — E119 Type 2 diabetes mellitus without complications: Secondary | ICD-10-CM

## 2023-12-07 DIAGNOSIS — E1169 Type 2 diabetes mellitus with other specified complication: Secondary | ICD-10-CM

## 2023-12-07 DIAGNOSIS — F419 Anxiety disorder, unspecified: Secondary | ICD-10-CM | POA: Diagnosis not present

## 2023-12-07 DIAGNOSIS — I10 Essential (primary) hypertension: Secondary | ICD-10-CM | POA: Diagnosis not present

## 2023-12-07 NOTE — Progress Notes (Signed)
 Subjective:    Patient ID: Jennifer [name], female    DOB: 04-09-1951, 73 y.o.   MRN: [id number]  DOS:  12/07/2023 Type of visit - description: Follow-up  Since the last office visit is doing well. She stopped Lexapro , feeling okay emotionally. BPs have been low lately, stop BP meds?.   BP Readings from Last 3 Encounters:  12/07/23 128/80  12/06/23 109/75  12/03/23 103/70   Wt Readings from Last 3 Encounters:  12/07/23 223 lb (101.2 kg)  12/06/23 220 lb (99.8 kg)  12/03/23 217 lb (98.4 kg)   Review of Systems See above   Past Medical History:  Diagnosis Date   Allergy    Anemia    Arthritis    Cataract    bil cataracts removed   Chronic chest pain    Chronic fatigue 02/03/2017   Clotting disorder [facility])    Pulmonary Embolis 2010 & 2015   Colon polyps    inflamed partially ulcerated hyperplastic polyp   Diabetes (HCC) 09/24/2013   no meds   Diverticulosis    Dysphagia esophageal phase - chronic after 3 fundoplications 01/20/2014   Edema of both lower extremities    Esophageal stenosis    Stricture after fundoplication REDO   Fibromyalgia    GERD (gastroesophageal reflux disease)    Glaucoma suspect    Heart murmur    no problems    Hemorrhoids, internal, with bleeding 06/27/2017   History of hiatal hernia    Hypertension    IBS (irritable bowel syndrome)    Lactose intolerance    Mitral valve prolapse    OSA (obstructive sleep apnea)    on CPAP-Mild - Sleep study 2004   Pre-diabetes    Pulmonary embolus (HCC)    2010 and 12-2013   Pulmonary nodule 12/13/2022   seen on CT chest xray on 12/16/22 - left upper lobe   Sleep apnea    wears CPAP   Thyroid  nodule      dr. watching    Tinnitus    Subjective   Zoster 2014   R flank    Past Surgical History:  Procedure Laterality Date   ABDOMINAL HYSTERECTOMY     still has 1 ovary    APPENDECTOMY     BRAIN SURGERY  1995   Stem surgery, Arnold Chiari Malformation   CATARACT EXTRACTION Bilateral 2017    cataracts, Dr. Cleatus   CHOLECYSTECTOMY     COLONOSCOPY     ESOPHAGEAL DILATION N/A 02/06/2023   Procedure: ESOPHAGEAL DILATION;  Surgeon: Shyrl Linnie KIDD, MD;  Location: [facility] OR;  Service: Thoracic;  Laterality: N/A;   ESOPHAGEAL DILATION N/A 02/13/2023   Procedure: ESOPHAGEAL DILATION;  Surgeon: Shyrl Linnie KIDD, MD;  Location: [facility] OR;  Service: Thoracic;  Laterality: N/A;   ESOPHAGEAL DILATION N/A 03/12/2023   Procedure: ESOPHAGEAL DILATION;  Surgeon: Shyrl Linnie KIDD, MD;  Location: [facility] OR;  Service: Thoracic;  Laterality: N/A;   ESOPHAGEAL MANOMETRY N/A 08/06/2019   Procedure: ESOPHAGEAL MANOMETRY (EM);  Surgeon: Avram Lupita BRAVO, MD;  Location: [facility] ENDOSCOPY;  Service: Endoscopy;  Laterality: N/A;   ESOPHAGOGASTRODUODENOSCOPY N/A 02/06/2023   Procedure: ESOPHAGOGASTRODUODENOSCOPY (EGD);  Surgeon: Shyrl Linnie KIDD, MD;  Location: [facility] OR;  Service: Thoracic;  Laterality: N/A;  With Savary dilation   ESOPHAGOGASTRODUODENOSCOPY N/A 02/13/2023   Procedure: ESOPHAGOGASTRODUODENOSCOPY (EGD);  Surgeon: Shyrl Linnie KIDD, MD;  Location: [facility] OR;  Service: Thoracic;  Laterality: N/A;   ESOPHAGOGASTRODUODENOSCOPY N/A 03/12/2023   Procedure: ESOPHAGOGASTRODUODENOSCOPY (EGD);  Surgeon: Shyrl,  Linnie KIDD, MD;  Location: [facility] OR;  Service: Thoracic;  Laterality: N/A;   HEMORRHOID BANDING     HERNIA REPAIR     Hital Hernia   JOINT REPLACEMENT Right    knee   KNEE ARTHROSCOPY  05/16/2012   Procedure: ARTHROSCOPY KNEE;  Surgeon: Rome JULIANNA Pepper, MD;  Location: [facility] OR;  Service: Orthopedics;  Laterality: Left;   KNEE ARTHROSCOPY Right 01/2018   NISSEN FUNDOPLICATION     X 3 lab, open x 2 last with mesh   POLYPECTOMY     REDUCTION MAMMAPLASTY Bilateral 2007   TOTAL KNEE ARTHROPLASTY Right 08/25/2019   Procedure: RIGHT TOTAL KNEE ARTHROPLASTY;  Surgeon: Melodi Lerner, MD;  Location: [facility] ORS;  Service: Orthopedics;  Laterality: Right;    UPPER GASTROINTESTINAL ENDOSCOPY       Current Outpatient Medications  Medication Instructions   acetaminophen  (TYLENOL ) 500 mg, Oral, Every 6 hours PRN   cycloSPORINE  (RESTASIS ) 0.05 % ophthalmic emulsion 1 drop, 2 times daily   hydrocortisone  valerate cream (WESTCORT ) 0.2 % 1 Application, Daily PRN   hyoscyamine  (LEVSIN ) 0.125 MG tablet Take 1 tablet (0.125 mg total) by mouth every 4 hours as needed.   Multiple Vitamins-Minerals (MULTIVITAMIN WITH MINERALS) tablet 2 tablets, Daily   NexIUM  40 mg, Oral, Daily before breakfast   Prevagen 10 mg, Daily   rosuvastatin  (CRESTOR ) 10 mg, Oral, Daily at bedtime   temazepam  (RESTORIL ) 30 MG capsule TAKE ONE CAPSULE BY MOUTH AT BEDTIME AS NEEDED F SLEEP   tirzepatide  (MOUNJARO ) 10 mg, Subcutaneous, Weekly   triamterene -hydrochlorothiazide  (MAXZIDE -25) 37.5-25 MG tablet 0.5 tablets, Oral, Daily   Xarelto  5 mg, Oral, Daily       Objective:   Physical Exam BP 128/80   Pulse 77   Temp 97.8 F (36.6 C) (Oral)   Resp 18   Ht 5' 8 (1.727 m)   Wt 223 lb (101.2 kg)   SpO2 95%   BMI 33.91 kg/m  General:   Well developed, NAD, BMI noted. HEENT:  Normocephalic . Face symmetric, atraumatic Lungs:  CTA B Normal respiratory effort, no intercostal retractions, no accessory muscle use. Heart: RRR,  no murmur.  Lower extremities: no pretibial edema bilaterally  Skin: Not pale. Not jaundice Neurologic:  alert & oriented X3.  Speech normal, gait appropriate for age and unassisted Psych--  Cognition and judgment appear intact.  Cooperative with normal attention span and concentration.  Behavior appropriate. No anxious or depressed appearing.      Assessment    Assessment   DM, + neuropathy  HTN Anxiety, insomnia: On restoril  prn  Pulmonary: --OSA, sleep study 2004, on a CPAP --PEs:2010 and 12-2013 - saw hematology 12-2014, rec anticoag x 2 years (until 12-2015), then continue with a low-dose xarelto    life -- multiple pulmonary nodule, last CT 11-2013: No further imaging  suggested --RAD, inhalers rarely Fibromyalgia CV:  MVP,  carotid US  1-39%s GI: --GERD,chronic dysphagia s/p  fundoplication 3, esophageal stenosis - April 2021: Barium study, esophageal manometry (see report) -- IBS --Hemorrhoids: S/p banding ~/2019 -- chronic right flank,RUQ pain : since GB surgery 2008 Etiology not clear but likely multifactorial --> Adhesions from the surgery, neuropathy (DM), postherpetic neuralgia, scarring from PE, etc.  MRI T spine done 06-2014: DJD  Thyroid  nodule: Bx (non neoplastic goiter) 2014,  US  05-2016, 2022 (stable) Zoster --  2014 right flank   PLAN DM: Last A1c very good, on Mounjaro . HTN: On Maxide 1/2 qd, at recent BP @ another office--102/70.  Today  BP is 128/80.  We agreed to check BPs regularly, hold Maxide if BP is consistently 110 or less, restart if BP increases.  She verbalized understanding of plan. Anxiety insomnia: See LOV, started Lexapro , stopped it b/c  questionable t rash on the face, reports she saw dermatology was told it was not an allergic reaction nevertheless she decided not to restart it, for now she is doing okay emotionally  Right lower abdominal pain, chronic: Saw gynecology yesterday.  They agreed to have a ultrasound before they try pelvic physical therapy.  Declined pelvic floor Botox Thyroid  nodule: Next visit with Endo 08/2024 Recommend flu and a COVID booster this fall RTC CPX 4 months

## 2023-12-07 NOTE — Patient Instructions (Addendum)
   Check the  blood pressure about 3 times a week -If your blood pressures consistently around 110/70, okay to hold the blood pressure medication (Triamterene  hydrochlorothiazide  half tablet daily).  -Then continue checking.  If your blood pressure starts creeping up to the 140s, restart the blood pressure medication. -Call if you have questions    Next office visit for a physical exam in about 4 months Please make an appointment before you leave today

## 2023-12-08 ENCOUNTER — Other Ambulatory Visit: Payer: Self-pay | Admitting: Gastroenterology

## 2023-12-09 NOTE — Assessment & Plan Note (Signed)
 DM: Last A1c very good, on Mounjaro . HTN: On Maxide 1/2 qd, at recent BP @ another office--102/70.  Today BP is 128/80.  We agreed to check BPs regularly, hold Maxide if BP is consistently 110 or less, restart if BP increases.  She verbalized understanding of plan. Anxiety insomnia: See LOV, started Lexapro , stopped it b/c  questionable t rash on the face, reports she saw dermatology was told it was not an allergic reaction nevertheless she decided not to restart it, for now she is doing okay emotionally  Right lower abdominal pain, chronic: Saw gynecology yesterday.  They agreed to have a ultrasound before they try pelvic physical therapy.  Declined pelvic floor Botox Thyroid  nodule: Next visit with Endo 08/2024 Recommend flu and a COVID booster this fall RTC CPX 4 months

## 2023-12-13 ENCOUNTER — Ambulatory Visit (HOSPITAL_COMMUNITY)
Admission: RE | Admit: 2023-12-13 | Discharge: 2023-12-13 | Disposition: A | Discharge status: Home / Self Care | Source: Ambulatory Visit | Attending: Obstetrics and Gynecology | Admitting: Obstetrics and Gynecology

## 2023-12-13 DIAGNOSIS — R102 Pelvic and perineal pain: Secondary | ICD-10-CM | POA: Diagnosis not present

## 2023-12-13 DIAGNOSIS — G8929 Other chronic pain: Secondary | ICD-10-CM | POA: Diagnosis not present

## 2023-12-13 DIAGNOSIS — Z90710 Acquired absence of both cervix and uterus: Secondary | ICD-10-CM | POA: Diagnosis not present

## 2023-12-14 ENCOUNTER — Encounter: Payer: Self-pay | Admitting: Physician Assistant

## 2023-12-14 ENCOUNTER — Telehealth (INDEPENDENT_AMBULATORY_CARE_PROVIDER_SITE_OTHER): Admitting: Physician Assistant

## 2023-12-14 ENCOUNTER — Telehealth: Payer: Self-pay

## 2023-12-14 DIAGNOSIS — J209 Acute bronchitis, unspecified: Secondary | ICD-10-CM

## 2023-12-14 MED ORDER — BENZONATATE 100 MG PO CAPS: 100.0000 mg | ORAL_CAPSULE | Freq: Two times a day (BID) | ORAL | 0 refills | Status: DC | PRN

## 2023-12-14 MED ORDER — AZITHROMYCIN 250 MG PO TABS: ORAL_TABLET | ORAL | 0 refills | Status: AC

## 2023-12-14 MED ORDER — HYDROCOD POLI-CHLORPHE POLI ER 10-8 MG/5ML PO SUER: 5.0000 mL | Freq: Every evening | ORAL | 0 refills | Status: DC | PRN

## 2023-12-14 NOTE — Telephone Encounter (Signed)
 Pt seeing LD virtually today at 3

## 2023-12-14 NOTE — Progress Notes (Signed)
 MyChart Video Visit    Virtual Visit via Video Note   This format is felt to be most appropriate for this patient at this time. Physical exam was limited by quality of the video and audio technology used for the visit.   Patient location: home Provider location: [email or web address]  I discussed the limitations of evaluation and management by telemedicine and the availability of in person appointments. The patient expressed understanding and agreed to proceed.  Patient: Jennifer [name]   DOB: 1950-08-10   73 y.o. Female  MRN: [id number] Visit Date: 12/14/2023  Today's healthcare provider: Manuelita Flatness, PA-C   Chief Complaint  Patient presents with   URI    Neg covid test today.  Symptoms started Monday with a sore throat. Taking tylenol     Headache   Subjective    URI  Associated symptoms include coughing, headaches, rhinorrhea and a sore throat. Pertinent negatives include no abdominal pain or chest pain.  Headache  Associated symptoms include coughing, rhinorrhea and a sore throat. Pertinent negatives include no abdominal pain, dizziness or fever.   Discussed the use of AI scribe software for clinical note transcription with the patient, who gave verbal consent to proceed.  History of Present Illness   Jennifer [name] is a 73 year old female who presents with cough, chest pain, and fever.  She has experienced symptoms since Monday, with significant deterioration by Thursday. She has a persistent cough causing chest pain and a burning sensation, accompanied by a constant headache. Her temperature was 99.69F last night and increased to 100.69F today. She has been taking Tylenol  every since yesterday and has used old tessalon  pearles which provided some relief. She did not sleep last night due to her symptoms.  She took a COVID test today which was negative. There is no wheezing or shortness of breath.       Medications: Outpatient Medications Prior to Visit  Medication Sig    acetaminophen  (TYLENOL ) 500 MG tablet Take 1 tablet (500 mg total) by mouth every 6 (six) hours as needed.   Apoaequorin (PREVAGEN) 10 MG CAPS Take 10 mg by mouth daily. prevagen   cycloSPORINE  (RESTASIS ) 0.05 % ophthalmic emulsion Place 1 drop into both eyes 2 (two) times daily.   hydrocortisone  valerate cream (WESTCORT ) 0.2 % Apply 1 Application topically daily as needed (itching).   hyoscyamine  (LEVSIN ) 0.125 MG tablet Take 1 tablet (0.125 mg total) by mouth every 4 hours as needed.   Multiple Vitamins-Minerals (MULTIVITAMIN WITH MINERALS) tablet Take 2 tablets by mouth daily. Vitafusion woman gummy   NEXIUM  40 MG capsule Take 1 capsule (40 mg total) by mouth daily before breakfast.   rosuvastatin  (CRESTOR ) 10 MG tablet Take 1 tablet (10 mg total) by mouth at bedtime.   temazepam  (RESTORIL ) 30 MG capsule TAKE ONE CAPSULE BY MOUTH AT BEDTIME AS NEEDED F SLEEP   tirzepatide  (MOUNJARO ) 10 MG/0.5ML Pen Inject 10 mg into the skin once a week.   triamterene -hydrochlorothiazide  (MAXZIDE -25) 37.5-25 MG tablet Take 0.5 tablets by mouth daily.   XARELTO  2.5 MG TABS tablet TAKE TWO TABLETS BY MOUTH DAILY   No facility-administered medications prior to visit.    Review of Systems  Constitutional:  Positive for fatigue. Negative for fever.  HENT:  Positive for rhinorrhea and sore throat.   Respiratory:  Positive for cough and chest tightness. Negative for shortness of breath.   Cardiovascular:  Negative for chest pain and leg swelling.  Gastrointestinal:  Negative for abdominal pain.  Neurological:  Positive for headaches. Negative for dizziness.        Objective    There were no vitals taken for this visit.      Physical Exam Constitutional:      Appearance: She is well-developed. She is not ill-appearing.  Psychiatric:        Mood and Affect: Mood normal.        Behavior: Behavior normal.        Assessment & Plan     1. Acute bronchitis, unspecified organism  (Primary) Fluids, tylenol , rest. - Prescribe Tessalon  pearls for cough management and tussionex at night Pt concerned she willl not improve, rx zpack to start Sunday if symptoms do not improve  - benzonatate  (TESSALON ) 100 MG capsule; Take 1 capsule (100 mg total) by mouth 2 (two) times daily as needed.  Dispense: 20 capsule; Refill: 0 - chlorpheniramine-HYDROcodone  (TUSSIONEX) 10-8 MG/5ML; Take 5 mLs by mouth at bedtime as needed.  Dispense: 115 mL; Refill: 0 - azithromycin  (ZITHROMAX ) 250 MG tablet; Take 2 tablets on day 1, then 1 tablet daily on days 2 through 5  Dispense: 6 tablet; Refill: 0   Return if symptoms worsen or fail to improve.     I discussed the assessment and treatment plan with the patient. The patient was provided an opportunity to ask questions and all were answered. The patient agreed with the plan and demonstrated an understanding of the instructions.   The patient was advised to call back or seek an in-person evaluation if the symptoms worsen or if the condition fails to improve as anticipated.  Manuelita Flatness, PA-C [facility] at [facility] [phone] (phone) [phone] (fax)  [facility]

## 2023-12-14 NOTE — Telephone Encounter (Signed)
 Needs appt please, if unable to come in, please offer virtual.

## 2023-12-14 NOTE — Telephone Encounter (Signed)
 Copied from CRM [id number]. Topic: General - Other >> Dec 14, 2023  9:20 AM Revonda D wrote: Reason for CRM: Pt stated that she is experiencing a cold and is coughing, has a runny nose, and body aches. Pt stated that she isn't able to come into the office and would like to know if Dr.Paz could prescribed some medication. Pt would like for Dr.Paz's nurse to give her a callback today.

## 2023-12-17 ENCOUNTER — Ambulatory Visit: Admitting: "Endocrinology

## 2023-12-17 ENCOUNTER — Other Ambulatory Visit: Payer: Self-pay | Admitting: Internal Medicine

## 2023-12-21 ENCOUNTER — Encounter: Payer: Self-pay | Admitting: "Endocrinology

## 2023-12-21 ENCOUNTER — Telehealth (INDEPENDENT_AMBULATORY_CARE_PROVIDER_SITE_OTHER): Admitting: "Endocrinology

## 2023-12-21 VITALS — Ht 68.0 in | Wt 218.0 lb

## 2023-12-21 DIAGNOSIS — E041 Nontoxic single thyroid nodule: Secondary | ICD-10-CM | POA: Diagnosis not present

## 2023-12-21 NOTE — Progress Notes (Signed)
 The patient reports they are currently: [facility]. I spent 6-7 minutes on the video with the patient on the date of service. I spent an additional 5 minutes on pre- and post-visit activities on the date of service.   The patient was physically located in [place]  or a state in which I am permitted to provide care. The patient and/or parent/guardian understood that s/he may incur co-pays and cost sharing, and agreed to the telemedicine visit. The visit was reasonable and appropriate under the circumstances given the patient's presentation at the time.  The patient and/or parent/guardian has been advised of the potential risks and limitations of this mode of treatment (including, but not limited to, the absence of in-person examination) and has agreed to be treated using telemedicine. The patient's/patient's family's questions regarding telemedicine have been answered.   The patient and/or parent/guardian has also been advised to contact their provider's office for worsening conditions, and seek emergency medical treatment and/or call 911 if the patient deems either necessary.     Outpatient Endocrinology Note Obadiah Birmingham, MD  12/21/23   Jennifer [name] 05/19/1950 [id number]  Referring Provider: Amon Aloysius BRAVO, MD Primary Care Provider: Amon Aloysius BRAVO, MD Subjective  No chief complaint on file.   Assessment & Plan  Diagnoses and all orders for this visit:  Thyroid  nodule   Jennifer [name] is currently taking no thyroid  medication. 07/24/23 baseline thyroid  labs TSH, FT4, FT3 WNL Recommend annual TSH with reflex free T4  History of many nodular goiter, last ultrasound in 05/2020 reported stable thyroid  nodule 07/2023 follow-up thyroid  ultrasound: Nodule 1: 2.5 x 2.1 x 1.9 cm left mid thyroid  nodule is unchanged in size since prior examination where it measured 2.3 x 2.0 x 2.3 cm. The nodule was biopsied in 2014 and was benign. Satisfactory Satisfactory For Evaluation: THYROID , FINE NEEDLE  ASPIRATION, ASPIRATION, LEFT BENIGN. FINDINGS FINDINGS CONSISTENT CONSISTENT WITH NON-NEOPLASTIC NON-NEOPLASTIC GOITER  Follow-up with thyroid  ultrasound as needed  I have reviewed current medications, nurse's notes, allergies, vital signs, past medical and surgical history, family medical history, and social history for this encounter. Counseled patient on symptoms, examination findings, lab findings, imaging results, treatment decisions and monitoring and prognosis. The patient understood the recommendations and agrees with the treatment plan. All questions regarding treatment plan were fully answered.   Return in about 1 year (around 12/20/2024).   Obadiah Birmingham, MD  12/21/23   I have reviewed current medications, nurse's notes, allergies, vital signs, past medical and surgical history, family medical history, and social history for this encounter. Counseled patient on symptoms, examination findings, lab findings, imaging results, treatment decisions and monitoring and prognosis. The patient understood the recommendations and agrees with the treatment plan. All questions regarding treatment plan were fully answered.   History of Present Illness Jennifer [name] is a 73 y.o. year old female who presents to our clinic with thyroid  nodule diagnosed around 2015.    Symptoms suggestive of HYPOTHYROIDISM:  fatigue Yes, sometimes  weight gain No cold intolerance  No constipation  Yes-has IBS  Symptoms suggestive of HYPERTHYROIDISM:  weight loss  Yes, on mounjaro   heat intolerance No hyperdefecation  No palpitations  No  Compressive symptoms:  dysphagia  No, gets esophagus dilated q63mo due to esophageal stricture dysphonia  No positional dyspnea (especially with simultaneous arms elevation)  No  Smokes  No On biotin  No Personal history of head/neck surgery/irradiation  Yes< history of brain surgery in s at [facility] due to arnold chiari malformation  Physical Exam  Ht 5' 8 (1.727 m)    Wt 218 lb (98.9 kg)   BMI 33.15 kg/m  Constitutional: well developed, well nourished Head: normocephalic, atraumatic, no exophthalmos Eyes: sclera anicteric, no redness Neck: + thyromegaly, - thyroid  tenderness; + nodules palpate Lungs: normal respiratory effort Neurology: alert and oriented, - fine hand tremor Skin: dry, no appreciable rashes Musculoskeletal: no appreciable defects Psychiatric: normal mood and affect  Allergies Allergies  Allergen Reactions   Doxepin  Nausea And Vomiting and Other (See Comments)    Feeling loopy   Morphine And Codeine Anaphylaxis and Shortness Of Breath   Sulfonamide Derivatives Hives    Burn from inside out   Promethazine Hcl Other (See Comments)    Hallucinations    Current Medications Patient's Medications  New Prescriptions   No medications on file  Previous Medications   ACETAMINOPHEN  (TYLENOL ) 500 MG TABLET    Take 1 tablet (500 mg total) by mouth every 6 (six) hours as needed.   APOAEQUORIN (PREVAGEN) 10 MG CAPS    Take 10 mg by mouth daily. prevagen   BENZONATATE  (TESSALON ) 100 MG CAPSULE    Take 1 capsule (100 mg total) by mouth 2 (two) times daily as needed.   CHLORPHENIRAMINE-HYDROCODONE  (TUSSIONEX) 10-8 MG/5ML    Take 5 mLs by mouth at bedtime as needed.   CYCLOSPORINE  (RESTASIS ) 0.05 % OPHTHALMIC EMULSION    Place 1 drop into both eyes 2 (two) times daily.   HYDROCORTISONE  VALERATE CREAM (WESTCORT ) 0.2 %    Apply 1 Application topically daily as needed (itching).   HYOSCYAMINE  (LEVSIN ) 0.125 MG TABLET    Take 1 tablet (0.125 mg total) by mouth every 4 hours as needed.   MULTIPLE VITAMINS-MINERALS (MULTIVITAMIN WITH MINERALS) TABLET    Take 2 tablets by mouth daily. Vitafusion woman gummy   NEXIUM  40 MG CAPSULE    Take 1 capsule (40 mg total) by mouth daily before breakfast.   ROSUVASTATIN  (CRESTOR ) 10 MG TABLET    Take 1 tablet (10 mg total) by mouth at bedtime.   TEMAZEPAM  (RESTORIL ) 30 MG CAPSULE    TAKE ONE CAPSULE BY MOUTH  AT BEDTIME AS NEEDED F SLEEP   TIRZEPATIDE  (MOUNJARO ) 10 MG/0.5ML PEN    Inject 10 mg into the skin once a week.   TRIAMTERENE -HYDROCHLOROTHIAZIDE  (MAXZIDE -25) 37.5-25 MG TABLET    Take 0.5 tablets by mouth daily.   XARELTO  2.5 MG TABS TABLET    TAKE TWO TABLETS BY MOUTH DAILY  Modified Medications   No medications on file  Discontinued Medications   No medications on file    Past Medical History Past Medical History:  Diagnosis Date   Allergy    Anemia    Arthritis    Cataract    bil cataracts removed   Chronic chest pain    Chronic fatigue 02/03/2017   Clotting disorder (HCC)    Pulmonary Embolis 2010 & 2015   Colon polyps    inflamed partially ulcerated hyperplastic polyp   Diabetes (HCC) 09/24/2013   no meds   Diverticulosis    Dysphagia esophageal phase - chronic after 3 fundoplications 01/20/2014   Edema of both lower extremities    Esophageal stenosis    Stricture after fundoplication REDO   Fibromyalgia    GERD (gastroesophageal reflux disease)    Glaucoma suspect    Heart murmur    no problems    Hemorrhoids, internal, with bleeding 06/27/2017   History of hiatal hernia    Hypertension  IBS (irritable bowel syndrome)    Lactose intolerance    Mitral valve prolapse    OSA (obstructive sleep apnea)    on CPAP-Mild - Sleep study 2004   Pre-diabetes    Pulmonary embolus (HCC)    2010 and 12-2013   Pulmonary nodule 12/13/2022   seen on CT chest xray on 12/16/22 - left upper lobe   Sleep apnea    wears CPAP   Thyroid  nodule      dr. watching    Tinnitus    Subjective   Zoster 2014   R flank    Past Surgical History Past Surgical History:  Procedure Laterality Date   ABDOMINAL HYSTERECTOMY     still has 1 ovary    APPENDECTOMY     BRAIN SURGERY  1995   Stem surgery, Arnold Chiari Malformation   CATARACT EXTRACTION Bilateral 2017   cataracts, Dr. Cleatus   CHOLECYSTECTOMY     COLONOSCOPY     ESOPHAGEAL DILATION N/A 02/06/2023   Procedure:  ESOPHAGEAL DILATION;  Surgeon: Shyrl Linnie KIDD, MD;  Location: [facility] OR;  Service: Thoracic;  Laterality: N/A;   ESOPHAGEAL DILATION N/A 02/13/2023   Procedure: ESOPHAGEAL DILATION;  Surgeon: Shyrl Linnie KIDD, MD;  Location: [facility] OR;  Service: Thoracic;  Laterality: N/A;   ESOPHAGEAL DILATION N/A 03/12/2023   Procedure: ESOPHAGEAL DILATION;  Surgeon: Shyrl Linnie KIDD, MD;  Location: [facility] OR;  Service: Thoracic;  Laterality: N/A;   ESOPHAGEAL MANOMETRY N/A 08/06/2019   Procedure: ESOPHAGEAL MANOMETRY (EM);  Surgeon: Avram Lupita BRAVO, MD;  Location: [facility];  Service: Endoscopy;  Laterality: N/A;   ESOPHAGOGASTRODUODENOSCOPY N/A 02/06/2023   Procedure: ESOPHAGOGASTRODUODENOSCOPY (EGD);  Surgeon: Shyrl Linnie KIDD, MD;  Location: [facility] OR;  Service: Thoracic;  Laterality: N/A;  With Savary dilation   ESOPHAGOGASTRODUODENOSCOPY N/A 02/13/2023   Procedure: ESOPHAGOGASTRODUODENOSCOPY (EGD);  Surgeon: Shyrl Linnie KIDD, MD;  Location: [facility] OR;  Service: Thoracic;  Laterality: N/A;   ESOPHAGOGASTRODUODENOSCOPY N/A 03/12/2023   Procedure: ESOPHAGOGASTRODUODENOSCOPY (EGD);  Surgeon: Shyrl Linnie KIDD, MD;  Location: [facility] OR;  Service: Thoracic;  Laterality: N/A;   HEMORRHOID BANDING     HERNIA REPAIR     Hital Hernia   JOINT REPLACEMENT Right    knee   KNEE ARTHROSCOPY  05/16/2012   Procedure: ARTHROSCOPY KNEE;  Surgeon: Rome JULIANNA Pepper, MD;  Location: [facility] OR;  Service: Orthopedics;  Laterality: Left;   KNEE ARTHROSCOPY Right 01/2018   NISSEN FUNDOPLICATION     X 3 lab, open x 2 last with mesh   POLYPECTOMY     REDUCTION MAMMAPLASTY Bilateral 2007   TOTAL KNEE ARTHROPLASTY Right 08/25/2019   Procedure: RIGHT TOTAL KNEE ARTHROPLASTY;  Surgeon: Melodi Lerner, MD;  Location: [facility] OR[facility];  Service: Orthopedics;  Laterality: Right;    UPPER GASTROINTESTINAL ENDOSCOPY      Family History family history includes Alcoholism in her father; Arthritis in her sister; CAD in her sister; Colon  cancer in her maternal grandmother; Crohn's disease in her maternal aunt; Diabetes in her mother; Fibromyalgia in her sister; Heart disease in her father; Hypertension in her father, mother, and sister; Lung cancer in her maternal uncle; Rheum arthritis in her mother, sister, sister, and sister; Stomach cancer in her maternal uncle; Sudden death in her father.  Social History Social History   Socioeconomic History   Marital status: Married    Spouse name: Neomia   Number of children: 1   Years of education: Not on file   Highest education level: Associate  degree: occupational, technical, or vocational program  Occupational History   Occupation: retired form the [employer] 04-2015    Employer: [employer]  Tobacco Use   Smoking status: Never   Smokeless tobacco: Never   Tobacco comments:    never used tobacco  Vaping Use   Vaping status: Never Used  Substance and Sexual Activity   Alcohol use: No    Alcohol/week: 0.0 standard drinks of alcohol   Drug use: No   Sexual activity: Not Currently    Birth control/protection: Surgical    Comment: Hysterectomy  Other Topics Concern   Not on file  Social History Narrative   [employer] Graduate - Technical school   Married '70 for 2 years, divorced; re-married '86 for 2.5 years, divorced; re-married '90 for 1 year, divorced; re-married '00   1 son - '69   Sister - Died @ 12 [place]       Social Drivers of Corporate investment banker Strain: Low Risk  (12/05/2023)   Overall Financial Resource Strain (CARDIA)    Difficulty of Paying Living Expenses: Not hard at all  Food Insecurity: No Food Insecurity (12/06/2023)   Hunger Vital Sign    Worried About Running Out of Food in the Last Year: Never true    Ran Out of Food in the Last Year: Never true  Transportation Needs: No Transportation Needs (12/05/2023)   PRAPARE - Administrator, Civil Service (Medical): No    Lack of Transportation (Non-Medical): No  Physical Activity:  Insufficiently Active (12/05/2023)   Exercise Vital Sign    Days of Exercise per Week: 2 days    Minutes of Exercise per Session: 40 min  Stress: No Stress Concern Present (12/05/2023)   [employer] of Occupational Health - Occupational Stress Questionnaire    Feeling of Stress: Only a little  Social Connections: Socially Integrated (12/05/2023)   Social Connection and Isolation Panel    Frequency of Communication with Friends and Family: More than three times a week    Frequency of Social Gatherings with Friends and Family: Twice a week    Attends Religious Services: More than 4 times per year    Active Member of Clubs or Organizations: Yes    Attends Engineer, structural: More than 4 times per year    Marital Status: Married  Catering manager Violence: Not At Risk (09/05/2023)   Humiliation, Afraid, Rape, and Kick questionnaire    Fear of Current or Ex-Partner: No    Emotionally Abused: No    Physically Abused: No    Sexually Abused: No    Laboratory Investigations Lab Results  Component Value Date   TSH 1.82 07/24/2023   TSH 4.110 12/12/2022   TSH 2.85 04/26/2021   FREET4 1.3 07/24/2023   FREET4 0.71 05/25/2016   FREET4 0.86 04/29/2014     No results found for: TSI   No components found for: TRAB   Lab Results  Component Value Date   CHOL 115 06/08/2023   Lab Results  Component Value Date   HDL 67 06/08/2023   Lab Results  Component Value Date   LDLCALC 34 06/08/2023   Lab Results  Component Value Date   TRIG 64 06/08/2023   Lab Results  Component Value Date   CHOLHDL 1.7 06/08/2023   Lab Results  Component Value Date   CREATININE 1.04 (H) 10/17/2023   Lab Results  Component Value Date   GFR 58.20 (L) 06/05/2022  Component Value Date/Time   NA 141 10/17/2023 1442   NA 143 12/12/2022 1100   NA 143 02/16/2017 1450   NA 142 01/19/2016 1141   K 4.0 10/17/2023 1442   K 3.7 02/16/2017 1450   K 3.7 01/19/2016 1141   CL 104  10/17/2023 1442   CL 105 02/16/2017 1450   CO2 29 10/17/2023 1442   CO2 32 02/16/2017 1450   CO2 27 01/19/2016 1141   GLUCOSE 91 10/17/2023 1442   GLUCOSE 88 02/16/2017 1450   BUN 22 10/17/2023 1442   BUN 17 12/12/2022 1100   BUN 14 02/16/2017 1450   BUN 19.5 01/19/2016 1141   CREATININE 1.04 (H) 10/17/2023 1442   CREATININE 1.2 02/16/2017 1450   CREATININE 0.9 01/19/2016 1141   CALCIUM  9.6 10/17/2023 1442   CALCIUM  8.8 02/16/2017 1450   CALCIUM  8.8 01/19/2016 1141   PROT 7.3 10/17/2023 1442   PROT 7.1 12/12/2022 1100   PROT 7.3 02/16/2017 1450   PROT 7.3 01/19/2016 1141   ALBUMIN 4.4 10/17/2023 1442   ALBUMIN 4.3 12/12/2022 1100   ALBUMIN 3.6 01/19/2016 1141   AST 29 10/17/2023 1442   AST 21 01/19/2016 1141   ALT 31 10/17/2023 1442   ALT 27 02/16/2017 1450   ALT 18 01/19/2016 1141   ALKPHOS 51 10/17/2023 1442   ALKPHOS 57 02/16/2017 1450   ALKPHOS 59 01/19/2016 1141   BILITOT 0.5 10/17/2023 1442   BILITOT 0.31 01/19/2016 1141   GFRNONAA 58 (L) 10/17/2023 1442   GFRAA >60 12/25/2019 1353      Latest Ref Rng & Units 10/17/2023    2:42 PM 06/18/2023    2:37 PM 03/16/2023    2:54 PM  BMP  Glucose 70 - 99 mg/dL 91  85  879   BUN 8 - 23 mg/dL 22  20  20    Creatinine 0.44 - 1.00 mg/dL 8.95  9.01  8.93   Sodium 135 - 145 mmol/L 141  142  144   Potassium 3.5 - 5.1 mmol/L 4.0  4.1  4.1   Chloride 98 - 111 mmol/L 104  104  105   CO2 22 - 32 mmol/L 29  31  32   Calcium  8.9 - 10.3 mg/dL 9.6  9.5  9.9        Component Value Date/Time   WBC 4.4 10/17/2023 1442   WBC 4.5 03/12/2023 0721   RBC 4.28 10/17/2023 1442   HGB 12.2 10/17/2023 1442   HGB 12.1 02/16/2017 1450   HCT 36.7 10/17/2023 1442   HCT 36.6 02/16/2017 1450   PLT 220 10/17/2023 1442   PLT 239 02/16/2017 1450   MCV 85.7 10/17/2023 1442   MCV 87 02/16/2017 1450   MCH 28.5 10/17/2023 1442   MCHC 33.2 10/17/2023 1442   RDW 15.6 (H) 10/17/2023 1442   RDW 15.4 02/16/2017 1450   LYMPHSABS 1.8 10/17/2023 1442    LYMPHSABS 1.9 02/16/2017 1450   MONOABS 0.4 10/17/2023 1442   EOSABS 0.2 10/17/2023 1442   EOSABS 0.2 02/16/2017 1450   BASOSABS 0.0 10/17/2023 1442   BASOSABS 0.0 02/16/2017 1450      Parts of this note may have been dictated using voice recognition software. There may be variances in spelling and vocabulary which are unintentional. Not all errors are proofread. Please notify the dino if any discrepancies are noted or if the meaning of any statement is not clear.

## 2023-12-27 ENCOUNTER — Ambulatory Visit (INDEPENDENT_AMBULATORY_CARE_PROVIDER_SITE_OTHER): Admitting: Family Medicine

## 2023-12-27 ENCOUNTER — Ambulatory Visit

## 2023-12-27 ENCOUNTER — Ambulatory Visit: Payer: Self-pay

## 2023-12-27 ENCOUNTER — Encounter: Payer: Self-pay | Admitting: Family Medicine

## 2023-12-27 VITALS — BP 128/78 | HR 76 | Temp 97.4°F | Ht 68.0 in | Wt 220.6 lb

## 2023-12-27 DIAGNOSIS — J22 Unspecified acute lower respiratory infection: Secondary | ICD-10-CM

## 2023-12-27 DIAGNOSIS — J4520 Mild intermittent asthma, uncomplicated: Secondary | ICD-10-CM

## 2023-12-27 DIAGNOSIS — R058 Other specified cough: Secondary | ICD-10-CM | POA: Diagnosis not present

## 2023-12-27 MED ORDER — AMOXICILLIN-POT CLAVULANATE 875-125 MG PO TABS: 1.0000 | ORAL_TABLET | Freq: Two times a day (BID) | ORAL | 0 refills | Status: DC

## 2023-12-27 MED ORDER — PREDNISONE 10 MG PO TABS: 10.0000 mg | ORAL_TABLET | Freq: Two times a day (BID) | ORAL | 0 refills | Status: AC

## 2023-12-27 NOTE — Telephone Encounter (Signed)
 Pt has an appt today.

## 2023-12-27 NOTE — Telephone Encounter (Signed)
 FYI Only or Action Required?: FYI only for provider.  Patient was last seen in primary care on 12/07/2023 by Amon Aloysius BRAVO, MD.  Called Nurse Triage reporting URI.  Symptoms began several weeks ago.  Interventions attempted: Prescription medications: tussalon perles, tussionex, zithromax . Also using tylenol .  Symptoms are: unchanged.  Triage Disposition: See Physician Within 24 Hours  Patient/caregiver understands and will follow disposition?: Yes  Reason for Disposition  [1] Continuous (nonstop) coughing interferes with work or school AND [2] no improvement using cough treatment per Care Advice  Answer Assessment - Initial Assessment Questions No improvement since being tx for bronchitis on 12/14/23. Productive cough (yellow or clear sputum), chest congestion and headache. Denies back pain.  1. ONSET: When did the cough begin?      12/13/23, dx with bronchitis on 12/14/23 video visit  3. SPUTUM: Describe the color of your sputum (e.g., none, dry cough; clear, white, yellow, green)     Yellow or clear  5. DIFFICULTY BREATHING: Are you having difficulty breathing? If Yes, ask: How bad is it? (e.g., mild, moderate, severe)      Denies  6. FEVER: Do you have a fever? If Yes, ask: What is your temperature, how was it measured, and when did it start?     States not today  Protocols used: Cough - Acute Productive-A-AH

## 2023-12-27 NOTE — Progress Notes (Signed)
 Established Patient Office Visit   Subjective:  Patient ID: Jennifer [name], female    DOB: [birth year]  Age: 73 y.o. MRN: [id number]  Chief Complaint  Patient presents with   URI    Pt complains of productive cough, headache, SOB, x 2 weeks. Chest congestion. Has been taking Tylenol .     URI  Associated symptoms include coughing. Pertinent negatives include no abdominal pain, rash or wheezing.   Encounter Diagnoses  Name Primary?   Mild intermittent asthmatic bronchitis without complication Yes   Lower respiratory infection    2-week history of persisting URI signs and symptoms that include a generalized headache, ongoing cough productive of yellow phlegm with chest tightness and wheezing.  Symptoms started on 8/20.  Seen in urgent care on 8/22 and treated with Zithromax  and antitussive  medications.  She does not feel that she is back to baseline.  She has had no fevers or chills, nausea or vomiting.  She continues to workout in the gym.  No tobacco or asthma history.  Distant history of PE.  Last lipid profile showed an LDL of 34 with an HDL of 67.   Review of Systems  Constitutional: Negative.   HENT: Negative.    Eyes:  Negative for blurred vision, discharge and redness.  Respiratory:  Positive for cough, sputum production and shortness of breath. Negative for wheezing.   Cardiovascular: Negative.   Gastrointestinal:  Negative for abdominal pain.  Genitourinary: Negative.   Musculoskeletal: Negative.  Negative for myalgias.  Skin:  Negative for rash.  Neurological:  Negative for tingling, loss of consciousness and weakness.  Endo/Heme/Allergies:  Negative for polydipsia.     Current Outpatient Medications:    amoxicillin -clavulanate (AUGMENTIN ) 875-125 MG tablet, Take 1 tablet by mouth 2 (two) times daily., Disp: 20 tablet, Rfl: 0   predniSONE  (DELTASONE ) 10 MG tablet, Take 1 tablet (10 mg total) by mouth 2 (two) times daily with a meal for 7 days., Disp: 14 tablet, Rfl: 0    acetaminophen  (TYLENOL ) 500 MG tablet, Take 1 tablet (500 mg total) by mouth every 6 (six) hours as needed., Disp: 30 tablet, Rfl: 0   Apoaequorin (PREVAGEN) 10 MG CAPS, Take 10 mg by mouth daily. prevagen, Disp: , Rfl:    benzonatate  (TESSALON ) 100 MG capsule, Take 1 capsule (100 mg total) by mouth 2 (two) times daily as needed., Disp: 20 capsule, Rfl: 0   chlorpheniramine-HYDROcodone  (TUSSIONEX) 10-8 MG/5ML, Take 5 mLs by mouth at bedtime as needed., Disp: 115 mL, Rfl: 0   cycloSPORINE  (RESTASIS ) 0.05 % ophthalmic emulsion, Place 1 drop into both eyes 2 (two) times daily., Disp: , Rfl:    hydrocortisone  valerate cream (WESTCORT ) 0.2 %, Apply 1 Application topically daily as needed (itching)., Disp: , Rfl:    hyoscyamine  (LEVSIN ) 0.125 MG tablet, Take 1 tablet (0.125 mg total) by mouth every 4 hours as needed., Disp: 90 tablet, Rfl: 0   Multiple Vitamins-Minerals (MULTIVITAMIN WITH MINERALS) tablet, Take 2 tablets by mouth daily. Vitafusion woman gummy, Disp: , Rfl:    NEXIUM  40 MG capsule, Take 1 capsule (40 mg total) by mouth daily before breakfast., Disp: 30 capsule, Rfl: 0   rosuvastatin  (CRESTOR ) 10 MG tablet, Take 1 tablet (10 mg total) by mouth at bedtime., Disp: 90 tablet, Rfl: 1   temazepam  (RESTORIL ) 30 MG capsule, TAKE ONE CAPSULE BY MOUTH AT BEDTIME AS NEEDED F SLEEP, Disp: 30 capsule, Rfl: 3   tirzepatide  (MOUNJARO ) 10 MG/0.5ML Pen, Inject 10 mg into the skin once  a week., Disp: 2 mL, Rfl: 1   triamterene -hydrochlorothiazide  (MAXZIDE -25) 37.5-25 MG tablet, Take 0.5 tablets by mouth daily., Disp: 45 tablet, Rfl: 1   XARELTO  2.5 MG TABS tablet, TAKE TWO TABLETS BY MOUTH DAILY, Disp: 60 tablet, Rfl: 0   Objective:     BP 128/78 (BP Location: Left Arm, Patient Position: Sitting, Cuff Size: Large)   Pulse 76   Temp (!) 97.4 F (36.3 C) (Temporal)   Ht 5' 8 (1.727 m)   Wt 220 lb 9.6 oz (100.1 kg)   SpO2 98%   BMI 33.54 kg/m    Physical Exam Constitutional:      General: She is  not in acute distress.    Appearance: Normal appearance. She is not ill-appearing, toxic-appearing or diaphoretic.  HENT:     Head: Normocephalic and atraumatic.     Right Ear: External ear normal.     Left Ear: External ear normal.     Mouth/Throat:     Mouth: Mucous membranes are moist.     Pharynx: Oropharynx is clear. No oropharyngeal exudate or posterior oropharyngeal erythema.  Eyes:     General: No scleral icterus.       Right eye: No discharge.        Left eye: No discharge.     Extraocular Movements: Extraocular movements intact.     Conjunctiva/sclera: Conjunctivae normal.     Pupils: Pupils are equal, round, and reactive to light.  Cardiovascular:     Rate and Rhythm: Normal rate and regular rhythm.  Pulmonary:     Effort: Pulmonary effort is normal. No respiratory distress.     Breath sounds: Normal breath sounds. Decreased air movement present. No wheezing, rhonchi or rales.  Abdominal:     General: Bowel sounds are normal.     Tenderness: There is no abdominal tenderness. There is no guarding.  Musculoskeletal:     Cervical back: No rigidity or tenderness.  Skin:    General: Skin is warm and dry.  Neurological:     Mental Status: She is alert and oriented to person, place, and time.  Psychiatric:        Mood and Affect: Mood normal.        Behavior: Behavior normal.      No results found for any visits on 12/27/23.    The ASCVD Risk score (Arnett DK, et al., 2019) failed to calculate for the following reasons:   The valid total cholesterol range is 130 to 320 mg/dL    Assessment & Plan:   Mild intermittent asthmatic bronchitis without complication -     DG Chest 2 View; Future -     predniSONE ; Take 1 tablet (10 mg total) by mouth 2 (two) times daily with a meal for 7 days.  Dispense: 14 tablet; Refill: 0  Lower respiratory infection -     CBC with Differential/Platelet -     DG Chest 2 View; Future -     Amoxicillin -Pot Clavulanate; Take 1 tablet  by mouth 2 (two) times daily.  Dispense: 20 tablet; Refill: 0    Return Schedule follow-up with Dr. Amon in a few weeks.Jennifer Elsie Sim Berneta, MD

## 2023-12-28 ENCOUNTER — Ambulatory Visit: Payer: Self-pay | Admitting: Family Medicine

## 2023-12-28 LAB — CBC WITH DIFFERENTIAL/PLATELET
Basophils Absolute: 0 K/uL (ref 0.0–0.1)
Basophils Relative: 0.6 % (ref 0.0–3.0)
Eosinophils Absolute: 0.2 K/uL (ref 0.0–0.7)
Eosinophils Relative: 3.5 % (ref 0.0–5.0)
HCT: 35.9 % — ABNORMAL LOW (ref 36.0–46.0)
Hemoglobin: 11.9 g/dL — ABNORMAL LOW (ref 12.0–15.0)
Lymphocytes Relative: 38.2 % (ref 12.0–46.0)
Lymphs Abs: 1.7 K/uL (ref 0.7–4.0)
MCHC: 33.1 g/dL (ref 30.0–36.0)
MCV: 85.9 fl (ref 78.0–100.0)
Monocytes Absolute: 0.5 K/uL (ref 0.1–1.0)
Monocytes Relative: 11.3 % (ref 3.0–12.0)
Neutro Abs: 2 K/uL (ref 1.4–7.7)
Neutrophils Relative %: 46.4 % (ref 43.0–77.0)
Platelets: 306 K/uL (ref 150.0–400.0)
RBC: 4.17 Mil/uL (ref 3.87–5.11)
RDW: 15.2 % (ref 11.5–15.5)
WBC: 4.4 K/uL (ref 4.0–10.5)

## 2023-12-31 ENCOUNTER — Telehealth: Payer: Self-pay | Admitting: Internal Medicine

## 2023-12-31 ENCOUNTER — Encounter (INDEPENDENT_AMBULATORY_CARE_PROVIDER_SITE_OTHER): Payer: Self-pay | Admitting: Internal Medicine

## 2023-12-31 ENCOUNTER — Ambulatory Visit: Payer: Self-pay | Admitting: Obstetrics and Gynecology

## 2023-12-31 ENCOUNTER — Ambulatory Visit (INDEPENDENT_AMBULATORY_CARE_PROVIDER_SITE_OTHER): Admitting: Internal Medicine

## 2023-12-31 VITALS — BP 131/78 | HR 72 | Temp 98.5°F | Ht 68.0 in | Wt 219.0 lb

## 2023-12-31 DIAGNOSIS — E1169 Type 2 diabetes mellitus with other specified complication: Secondary | ICD-10-CM

## 2023-12-31 DIAGNOSIS — G4733 Obstructive sleep apnea (adult) (pediatric): Secondary | ICD-10-CM | POA: Diagnosis not present

## 2023-12-31 DIAGNOSIS — Z7985 Long-term (current) use of injectable non-insulin antidiabetic drugs: Secondary | ICD-10-CM | POA: Diagnosis not present

## 2023-12-31 DIAGNOSIS — I10 Essential (primary) hypertension: Secondary | ICD-10-CM

## 2023-12-31 DIAGNOSIS — Z6833 Body mass index (BMI) 33.0-33.9, adult: Secondary | ICD-10-CM | POA: Diagnosis not present

## 2023-12-31 MED ORDER — TIRZEPATIDE 10 MG/0.5ML ~~LOC~~ SOAJ: 10.0000 mg | SUBCUTANEOUS | 1 refills | Status: DC

## 2023-12-31 NOTE — Telephone Encounter (Signed)
 See my chart message

## 2023-12-31 NOTE — Telephone Encounter (Signed)
 Copied from CRM [id number]. Topic: Clinical - Medication Question >> Dec 31, 2023 12:40 PM Leah C wrote: Reason for CRM: Patient is wanting to get a covid shot and was told to call clinic for prescription for shot.   Patient needs to get a prescription for her Covid shot. Pharmacists name is Chester.   [facility]. [address] [place], [place], [place] [address]  [phone] (H) Patient contact

## 2023-12-31 NOTE — Progress Notes (Signed)
 "  Office: [phone]  /  Fax: [phone]  Weight Summary and Body Composition Analysis (BIA)  Vitals Temp: 98.5 F (36.9 C) BP: 131/78 Pulse Rate: 72 SpO2: 97 %   Anthropometric Measurements Height: 5' 8 (1.727 m) Weight: 219 lb (99.3 kg) BMI (Calculated): 33.31 Weight at Last Visit: 217 lb Weight Lost Since Last Visit: 0 Weight Gained Since Last Visit: 2 lb Starting Weight: 256 lb Total Weight Loss (lbs): 37 lb (16.8 kg) Peak Weight: 259 lb   Body Composition  Body Fat %: 44.9 % Fat Mass (lbs): 98.4 lbs Muscle Mass (lbs): 114.6 lbs Total Body Water (lbs): 81.2 lbs Visceral Fat Rating : 14    No data recorded Today's Visit #: 14  Starting Date: 12/12/22   Subjective   Chief Complaint: Obesity  Interval History Discussed the use of AI scribe software for clinical note transcription with the patient, who gave verbal consent to proceed.  History of Present Illness Jennifer [name] is a 73 year old female who presents for medical weight management.  She is currently following a 1200-calorie nutrition plan about 75% of the time she is exercising 2 days a week 60 minutes doing swimming.  She is currently on Mounjaro  for weight management, experiencing appetite suppression, and is unable to finish meals, such as only eating half of a piece of salmon. She avoids sweets, except for occasional protein marshmallows, and maintains a diet with three meals a day, including protein shakes, boiled eggs, cheese, and toast. She has gained two pounds.  She has a history of GERD, which affects her eating habits due to pain. She is currently taking prednisone  in the morning to avoid insomnia, experiencing restlessness and increased activity around the house, which she attributes to the medication.      Challenges affecting patient progress: none.    Pharmacotherapy for weight management: She is currently taking Monjauro with diabetes as the primary indication and obesity  secondary with adequate clinical response  and without side effects..   Assessment and Plan   Treatment Plan For Obesity:  Recommended Dietary Goals  Jennifer [name] is currently in the action stage of change. As such, her goal is to continue weight management plan. She has agreed to: continue current plan  Behavioral Health and Counseling  We discussed the following behavioral modification strategies today: continue to work on maintaining a reduced calorie state, getting the recommended amount of protein, incorporating whole foods, making healthy choices, staying well hydrated and practicing mindfulness when eating. and increase protein intake, fibrous foods (25 grams per day for women, 30 grams for men) and water to improve satiety and decrease hunger signals. .  Additional education and resources provided today: None  Recommended Physical Activity Goals  Jennifer [name] has been advised to work up to 150 minutes of moderate intensity aerobic activity a week and strengthening exercises 2-3 times per week for cardiovascular health, weight loss maintenance and preservation of muscle mass.  She has agreed to :  Think about enjoyable ways to increase daily physical activity and overcoming barriers to exercise, Increase physical activity in their day and reduce sedentary time (increase NEAT)., Increase volume of physical activity to a goal of 240 minutes a week, and Combine aerobic and strengthening exercises for efficiency and improved cardiometabolic health.  Medical Interventions and Pharmacotherapy  We discussed various medication options to help Jennifer [name] with her weight loss efforts and we both agreed to : Adequate clinical response to anti-obesity medication, continue current regimen  Associated Conditions Impacted  by Obesity Treatment  Assessment & Plan Essential hypertension Her medications have been adjusted she is not having orthostasis.  Her blood pressure is adequately controlled.  Continue  triamterene -hydrochlorothiazide .  His appetite has had an effect on blood pressure. Type 2 diabetes mellitus with other specified complication, without long-term current use of insulin  (HCC) She is no longer having fatigue since blood pressure medications were adjusted by primary care team.  I do not think her fatigue was due to low blood sugars.  She is due for an A1c and this will be checked today.  She will continue on Mounjaro  10 mg once a day a week.  Denies any adverse effects or signs or symptoms of hypoglycemia.  OSA on CPAP On CPAP with reported good compliance. Continue PAP therapy. Losing 15% or more of body weight may improve AHI.  Patient will continue GLP-1 therapy for diabetes management as well as assist with her obesity and associated comorbid conditions.           Objective   Physical Exam:  Blood pressure 131/78, pulse 72, temperature 98.5 F (36.9 C), height 5' 8 (1.727 m), weight 219 lb (99.3 kg), SpO2 97%. Body mass index is 33.3 kg/m.  General: She is overweight, cooperative, alert, well developed, and in no acute distress. PSYCH: Has normal mood, affect and thought process.   HEENT: EOMI, sclerae are anicteric. Lungs: Normal breathing effort, no conversational dyspnea. Extremities: No edema.  Neurologic: No gross sensory or motor deficits. No tremors or fasciculations noted.    Diagnostic Data Reviewed:  BMET    Component Value Date/Time   NA 141 10/17/2023 1442   NA 143 12/12/2022 1100   NA 143 02/16/2017 1450   NA 142 01/19/2016 1141   K 4.0 10/17/2023 1442   K 3.7 02/16/2017 1450   K 3.7 01/19/2016 1141   CL 104 10/17/2023 1442   CL 105 02/16/2017 1450   CO2 29 10/17/2023 1442   CO2 32 02/16/2017 1450   CO2 27 01/19/2016 1141   GLUCOSE 91 10/17/2023 1442   GLUCOSE 88 02/16/2017 1450   BUN 22 10/17/2023 1442   BUN 17 12/12/2022 1100   BUN 14 02/16/2017 1450   BUN 19.5 01/19/2016 1141   CREATININE 1.04 (H) 10/17/2023 1442   CREATININE  1.2 02/16/2017 1450   CREATININE 0.9 01/19/2016 1141   CALCIUM  9.6 10/17/2023 1442   CALCIUM  8.8 02/16/2017 1450   CALCIUM  8.8 01/19/2016 1141   GFRNONAA 58 (L) 10/17/2023 1442   GFRAA >60 12/25/2019 1353   Lab Results  Component Value Date   HGBA1C 5.6 12/31/2023   HGBA1C 6.2 (H) 10/14/2007   Lab Results  Component Value Date   INSULIN  20.4 12/12/2022   INSULIN  12.6 05/19/2021   Lab Results  Component Value Date   TSH 1.82 07/24/2023   CBC    Component Value Date/Time   WBC 4.4 12/27/2023 1513   RBC 4.17 12/27/2023 1513   HGB 11.9 (L) 12/27/2023 1513   HGB 12.2 10/17/2023 1442   HGB 12.1 02/16/2017 1450   HCT 35.9 (L) 12/27/2023 1513   HCT 36.6 02/16/2017 1450   PLT 306.0 12/27/2023 1513   PLT 220 10/17/2023 1442   PLT 239 02/16/2017 1450   MCV 85.9 12/27/2023 1513   MCV 87 02/16/2017 1450   MCH 28.5 10/17/2023 1442   MCHC 33.1 12/27/2023 1513   RDW 15.2 12/27/2023 1513   RDW 15.4 02/16/2017 1450   Iron Studies    Component Value Date/Time  IRON 59 12/05/2021 1451   IRON 49 02/16/2017 1536   TIBC 323 12/05/2021 1451   TIBC 284 02/16/2017 1536   FERRITIN 51 12/05/2021 1452   FERRITIN 66 02/16/2017 1536   IRONPCTSAT 18 12/05/2021 1451   IRONPCTSAT 17 (L) 02/16/2017 1536   Lipid Panel     Component Value Date/Time   CHOL 115 06/08/2023 1422   TRIG 64 06/08/2023 1422   HDL 67 06/08/2023 1422   CHOLHDL 1.7 06/08/2023 1422   VLDL 9.8 06/05/2022 1320   LDLCALC 34 06/08/2023 1422   Hepatic Function Panel     Component Value Date/Time   PROT 7.3 10/17/2023 1442   PROT 7.1 12/12/2022 1100   PROT 7.3 02/16/2017 1450   PROT 7.3 01/19/2016 1141   ALBUMIN 4.4 10/17/2023 1442   ALBUMIN 4.3 12/12/2022 1100   ALBUMIN 3.6 01/19/2016 1141   AST 29 10/17/2023 1442   AST 21 01/19/2016 1141   ALT 31 10/17/2023 1442   ALT 27 02/16/2017 1450   ALT 18 01/19/2016 1141   ALKPHOS 51 10/17/2023 1442   ALKPHOS 57 02/16/2017 1450   ALKPHOS 59 01/19/2016 1141    BILITOT 0.5 10/17/2023 1442   BILITOT 0.31 01/19/2016 1141   BILIDIR 0.0 01/08/2012 1224   IBILI 0.2 12/30/2010 1515      Component Value Date/Time   TSH 1.82 07/24/2023 1436   Nutritional Lab Results  Component Value Date   VD25OH 61.5 12/12/2022   VD25OH 42.2 05/19/2021    Medications: Outpatient Encounter Medications as of 12/31/2023  Medication Sig   acetaminophen  (TYLENOL ) 500 MG tablet Take 1 tablet (500 mg total) by mouth every 6 (six) hours as needed.   amoxicillin -clavulanate (AUGMENTIN ) 875-125 MG tablet Take 1 tablet by mouth 2 (two) times daily.   Apoaequorin (PREVAGEN) 10 MG CAPS Take 10 mg by mouth daily. prevagen   cycloSPORINE  (RESTASIS ) 0.05 % ophthalmic emulsion Place 1 drop into both eyes 2 (two) times daily.   hydrocortisone  valerate cream (WESTCORT ) 0.2 % Apply 1 Application topically daily as needed (itching).   hyoscyamine  (LEVSIN ) 0.125 MG tablet Take 1 tablet (0.125 mg total) by mouth every 4 hours as needed.   Multiple Vitamins-Minerals (MULTIVITAMIN WITH MINERALS) tablet Take 2 tablets by mouth daily. Vitafusion woman gummy   NEXIUM  40 MG capsule Take 1 capsule (40 mg total) by mouth daily before breakfast.   predniSONE  (DELTASONE ) 10 MG tablet Take 1 tablet (10 mg total) by mouth 2 (two) times daily with a meal for 7 days.   rosuvastatin  (CRESTOR ) 10 MG tablet Take 1 tablet (10 mg total) by mouth at bedtime.   temazepam  (RESTORIL ) 30 MG capsule TAKE ONE CAPSULE BY MOUTH AT BEDTIME AS NEEDED F SLEEP   triamterene -hydrochlorothiazide  (MAXZIDE -25) 37.5-25 MG tablet Take 0.5 tablets by mouth daily.   XARELTO  2.5 MG TABS tablet TAKE TWO TABLETS BY MOUTH DAILY   [DISCONTINUED] tirzepatide  (MOUNJARO ) 10 MG/0.5ML Pen Inject 10 mg into the skin once a week.   tirzepatide  (MOUNJARO ) 10 MG/0.5ML Pen Inject 10 mg into the skin once a week.   [DISCONTINUED] benzonatate  (TESSALON ) 100 MG capsule Take 1 capsule (100 mg total) by mouth 2 (two) times daily as needed.  (Patient not taking: Reported on 12/31/2023)   [DISCONTINUED] chlorpheniramine-HYDROcodone  (TUSSIONEX) 10-8 MG/5ML Take 5 mLs by mouth at bedtime as needed. (Patient not taking: Reported on 12/31/2023)   No facility-administered encounter medications on file as of 12/31/2023.     Follow-Up   Return in about 4 weeks (around  01/28/2024) for For Weight Mangement with Dr. Francyne.SABRA She was informed of the importance of frequent follow up visits to maximize her success with intensive lifestyle modifications for her multiple health conditions.  Attestation Statement   Reviewed by clinician on day of visit: allergies, medications, problem list, medical history, surgical history, family history, social history, and previous encounter notes.     Lucas Francyne, MD  "

## 2024-01-01 ENCOUNTER — Other Ambulatory Visit: Payer: Self-pay | Admitting: Hematology & Oncology

## 2024-01-01 ENCOUNTER — Ambulatory Visit (INDEPENDENT_AMBULATORY_CARE_PROVIDER_SITE_OTHER): Payer: Self-pay | Admitting: Internal Medicine

## 2024-01-01 LAB — HEMOGLOBIN A1C
Est. average glucose Bld gHb Est-mCnc: 114 mg/dL
Hgb A1c MFr Bld: 5.6 % (ref 4.8–5.6)

## 2024-01-01 NOTE — Assessment & Plan Note (Signed)
 Her medications have been adjusted she is not having orthostasis.  Her blood pressure is adequately controlled.  Continue triamterene -hydrochlorothiazide .  His appetite has had an effect on blood pressure.

## 2024-01-01 NOTE — Assessment & Plan Note (Signed)
 On CPAP with reported good compliance. Continue PAP therapy. Losing 15% or more of body weight may improve AHI.  Patient will continue GLP-1 therapy for diabetes management as well as assist with her obesity and associated comorbid conditions.

## 2024-01-01 NOTE — Assessment & Plan Note (Signed)
 She is no longer having fatigue since blood pressure medications were adjusted by primary care team.  I do not think her fatigue was due to low blood sugars.  She is due for an A1c and this will be checked today.  She will continue on Mounjaro  10 mg once a day a week.  Denies any adverse effects or signs or symptoms of hypoglycemia.

## 2024-01-02 ENCOUNTER — Encounter: Payer: Self-pay | Admitting: Family

## 2024-01-02 DIAGNOSIS — Z9889 Other specified postprocedural states: Secondary | ICD-10-CM | POA: Diagnosis not present

## 2024-01-02 DIAGNOSIS — H04129 Dry eye syndrome of unspecified lacrimal gland: Secondary | ICD-10-CM | POA: Diagnosis not present

## 2024-01-02 DIAGNOSIS — H35371 Puckering of macula, right eye: Secondary | ICD-10-CM | POA: Diagnosis not present

## 2024-01-02 DIAGNOSIS — H15013 Anterior scleritis, bilateral: Secondary | ICD-10-CM | POA: Diagnosis not present

## 2024-01-02 DIAGNOSIS — Z961 Presence of intraocular lens: Secondary | ICD-10-CM | POA: Diagnosis not present

## 2024-01-03 MED ORDER — COVID-19 MRNA VAC-TRIS(PFIZER) 30 MCG/0.3ML IM SUSY: 0.3000 mL | PREFILLED_SYRINGE | Freq: Once | INTRAMUSCULAR | 0 refills | Status: AC

## 2024-01-03 NOTE — Addendum Note (Signed)
 Addended by: Danese Dorsainvil D on: 01/03/2024 03:55 PM   Modules accepted: Orders

## 2024-01-15 ENCOUNTER — Other Ambulatory Visit: Payer: Self-pay | Admitting: Gastroenterology

## 2024-01-28 ENCOUNTER — Ambulatory Visit (INDEPENDENT_AMBULATORY_CARE_PROVIDER_SITE_OTHER): Admitting: Internal Medicine

## 2024-01-28 ENCOUNTER — Encounter (INDEPENDENT_AMBULATORY_CARE_PROVIDER_SITE_OTHER): Payer: Self-pay | Admitting: Internal Medicine

## 2024-01-28 VITALS — BP 134/83 | HR 86 | Temp 98.2°F | Ht 68.0 in | Wt 219.0 lb

## 2024-01-28 DIAGNOSIS — E66811 Obesity, class 1: Secondary | ICD-10-CM | POA: Diagnosis not present

## 2024-01-28 DIAGNOSIS — Z7985 Long-term (current) use of injectable non-insulin antidiabetic drugs: Secondary | ICD-10-CM

## 2024-01-28 DIAGNOSIS — R1310 Dysphagia, unspecified: Secondary | ICD-10-CM

## 2024-01-28 DIAGNOSIS — Z6833 Body mass index (BMI) 33.0-33.9, adult: Secondary | ICD-10-CM | POA: Diagnosis not present

## 2024-01-28 DIAGNOSIS — E1169 Type 2 diabetes mellitus with other specified complication: Secondary | ICD-10-CM

## 2024-01-28 DIAGNOSIS — G4733 Obstructive sleep apnea (adult) (pediatric): Secondary | ICD-10-CM

## 2024-01-28 MED ORDER — TIRZEPATIDE 10 MG/0.5ML ~~LOC~~ SOAJ: 10.0000 mg | SUBCUTANEOUS | 1 refills | Status: DC

## 2024-01-28 NOTE — Assessment & Plan Note (Signed)
  Obesity and medical weight management Weight loss plateau due to inconsistent adherence to a 1200 calorie nutrition plan, influenced by esophageal stricture. Exercise routine maintained at 3-5 days per week for 30-45 minutes. Potential underconsumption of calories due to dysphagia, leading to reliance on liquid protein sources. Metabolic rate may be affected by low caloric intake, contributing to weight loss plateau. Previous success in weight loss noted with increased solid food intake post-esophageal dilation. - Schedule metabolic rate assessment with indirect calorimetry in 3 to 4 weeks - Instruct to track caloric intake for 3 days a week over the next 2 weeks using an app like MyFitnessPal to determine actual caloric consumption. - Provide a modified nutrition plan with softer consistency foods post-metabolic assessment. - Continue current exercise regimen.

## 2024-01-28 NOTE — Assessment & Plan Note (Signed)
 HgbA1c is at goal for age and comorbid conditions. Denies symptoms of hypoglycemia or hyperglycemia. On Mounjaro  with good adherence and no side effects.   Counseled on goals of care, monitoring for complications and importance of staying updated on immunizations and diabetes preventive measures. Continue with reduced calorie meal plan low on processed crabs and simple sugars. Ongoing weight loss will improve insulin  resistance and glycemic control  Lab Results  Component Value Date   HGBA1C 5.6 12/31/2023   HGBA1C 6.2 (H) 06/08/2023   HGBA1C 6.3 (H) 12/12/2022   Lab Results  Component Value Date   MICROALBUR <0.2 06/08/2023   LDLCALC 34 06/08/2023   CREATININE 1.04 (H) 10/17/2023   Continue current weight management strategy

## 2024-01-28 NOTE — Progress Notes (Signed)
 Office: [phone]  /  Fax: [phone]  Weight Summary and Body Composition Analysis (BIA)  Vitals Temp: 98.2 F (36.8 C) BP: 134/83 Pulse Rate: 86 SpO2: 98 %   Anthropometric Measurements Height: 5' 8 (1.727 m) Weight: 219 lb (99.3 kg) BMI (Calculated): 33.31 Weight at Last Visit: 219 lb Weight Lost Since Last Visit: 0 lb Weight Gained Since Last Visit: 0 lb Starting Weight: 256 lb Total Weight Loss (lbs): 37 lb (16.8 kg) Peak Weight: 259 lb   Body Composition  Body Fat %: 45.3 % Fat Mass (lbs): 99.6 lbs Muscle Mass (lbs): 114.2 lbs Total Body Water (lbs): 82.8 lbs Visceral Fat Rating : 14    RMR: 1771  Today's Visit #: 15  Starting Date: 12/12/22   Subjective   Chief Complaint: Obesity  Interval History Discussed the use of AI scribe software for clinical note transcription with the patient, who gave verbal consent to proceed.  History of Present Illness Jennifer [name] is a 73 year old female with hypertension, sleep apnea, and type 2 diabetes who presents for medical weight management.  Jennifer [name] is following a 1200 calorie nutrition plan approximately 75% of the time, tracking her food intake, eating more home-cooked meals, ensuring adequate protein intake, and maintaining hydration. Jennifer [name] reports concern that Jennifer [name] may not be consuming enough calories because of her swallowing difficulties. Jennifer [name] has lost 42 pounds, which is 16% of her body weight, and has maintained this weight loss.  Jennifer [name] experiences difficulty swallowing, particularly with solid foods, including eggs. This issue has been ongoing, and Jennifer [name] describes a sensation where her brain signals fullness prematurely, leading to discomfort and sometimes necessitating regurgitation. It has been six months since her last esophageal dilation procedure. Her current diet includes protein shakes, eggs, cheese, and bread. Jennifer [name] sometimes skips lunch, opting for another protein shake instead. Jennifer [name] incorporates cottage  cheese, grapes, and [place] yogurt into her diet, adding protein powder to these foods. Jennifer [name] also consumes a green drink with added protein after gym sessions three times a week.  Jennifer [name] exercises three to five days a week for 30 to 45 minutes per session. Jennifer [name] feels her weight loss has plateaued and is concerned that her swallowing difficulties may be contributing to this.  Her past medical history includes hypertension, sleep apnea managed with CPAP, and type 2 diabetes. Jennifer [name] has previously undergone esophageal dilation procedures to address swallowing difficulties, which Jennifer [name] associates with improved ability to consume solid foods and subsequent weight loss.  Jennifer [name] is currently using Mounjaro  for weight management.     Challenges affecting patient progress: metabolic adaptations associated with weight loss and dysphagia due to esophageal stricture.    Pharmacotherapy for weight management: Jennifer [name] is currently taking Monjauro with diabetes as the primary indication and obesity secondary with adequate clinical response  and without side effects..   Assessment and Plan   Treatment Plan For Obesity:  Recommended Dietary Goals  Jennifer [name] is currently in the action stage of change. As such, her goal is to continue weight management plan. Jennifer [name] has agreed to: continue current plan  Behavioral Health and Counseling  We discussed the following behavioral modification strategies today: continue to work on maintaining a reduced calorie state, getting the recommended amount of protein, incorporating whole foods, making healthy choices, staying well hydrated and practicing mindfulness when eating. and increase protein intake, fibrous foods (25 grams per day for women, 30 grams for men) and water to improve satiety and decrease hunger signals. .  Additional education and  resources provided today: None  Recommended Physical Activity Goals  Jennifer [name] has been advised to work up to 150 minutes of moderate intensity aerobic  activity a week and strengthening exercises 2-3 times per week for cardiovascular health, weight loss maintenance and preservation of muscle mass.  Jennifer [name] has agreed to :  Increase volume of physical activity to a goal of 240 minutes a week and Combine aerobic and strengthening exercises for efficiency and improved cardiometabolic health.  Medical Interventions and Pharmacotherapy  We discussed various medication options to help Jennifer [name] with her weight loss efforts and we both agreed to : Adequate clinical response to anti-obesity medication, continue current regimen  Associated Conditions Impacted by Obesity Treatment  Assessment & Plan Type 2 diabetes mellitus with other specified complication, without long-term current use of insulin  (HCC) HgbA1c is at goal for age and comorbid conditions. Denies symptoms of hypoglycemia or hyperglycemia. On Mounjaro  with good adherence and no side effects.   Counseled on goals of care, monitoring for complications and importance of staying updated on immunizations and diabetes preventive measures. Continue with reduced calorie meal plan low on processed crabs and simple sugars. Ongoing weight loss will improve insulin  resistance and glycemic control  Lab Results  Component Value Date   HGBA1C 5.6 12/31/2023   HGBA1C 6.2 (H) 06/08/2023   HGBA1C 6.3 (H) 12/12/2022   Lab Results  Component Value Date   MICROALBUR <0.2 06/08/2023   LDLCALC 34 06/08/2023   CREATININE 1.04 (H) 10/17/2023   Continue current weight management strategy   Class 1 obesity with serious comorbidity and body mass index (BMI) of 33.0 to 33.9 in adult, unspecified obesity type  Obesity and medical weight management Weight loss plateau due to inconsistent adherence to a 1200 calorie nutrition plan, influenced by esophageal stricture. Exercise routine maintained at 3-5 days per week for 30-45 minutes. Potential underconsumption of calories due to dysphagia, leading to reliance on  liquid protein sources. Metabolic rate may be affected by low caloric intake, contributing to weight loss plateau. Previous success in weight loss noted with increased solid food intake post-esophageal dilation. - Schedule metabolic rate assessment with indirect calorimetry in 3 to 4 weeks - Instruct to track caloric intake for 3 days a week over the next 2 weeks using an app like MyFitnessPal to determine actual caloric consumption. - Provide a modified nutrition plan with softer consistency foods post-metabolic assessment. - Continue current exercise regimen.  Dysphagia, unspecified type Esophageal stricture with dysphagia Increased difficulty swallowing solids, including soft foods like eggs, indicating possible esophageal stricture recurrence. Last dilation was over a year ago. Fear of pain from dilation procedure is causing avoidance of solid foods, impacting nutritional intake and weight management. Previous dilations have improved ability to consume solids and facilitated weight loss. - Refer to Dr. Shyrl for esophageal dilation. - Discuss with Dr. Nandigan about the need for dilation during upcoming appointment. - Consider modifying diet to include pureed or softer consistency foods to manage dysphagia symptoms.          Objective   Physical Exam:  Blood pressure 134/83, pulse 86, temperature 98.2 F (36.8 C), height 5' 8 (1.727 m), weight 219 lb (99.3 kg), SpO2 98%. Body mass index is 33.3 kg/m.  General: Jennifer [name] is overweight, cooperative, alert, well developed, and in no acute distress. PSYCH: Has normal mood, affect and thought process.   HEENT: EOMI, sclerae are anicteric. Lungs: Normal breathing effort, no conversational dyspnea. Extremities: No edema.  Neurologic: No gross sensory or motor deficits.  No tremors or fasciculations noted.    Diagnostic Data Reviewed:  BMET    Component Value Date/Time   NA 141 10/17/2023 1442   NA 143 12/12/2022 1100   NA 143  02/16/2017 1450   NA 142 01/19/2016 1141   K 4.0 10/17/2023 1442   K 3.7 02/16/2017 1450   K 3.7 01/19/2016 1141   CL 104 10/17/2023 1442   CL 105 02/16/2017 1450   CO2 29 10/17/2023 1442   CO2 32 02/16/2017 1450   CO2 27 01/19/2016 1141   GLUCOSE 91 10/17/2023 1442   GLUCOSE 88 02/16/2017 1450   BUN 22 10/17/2023 1442   BUN 17 12/12/2022 1100   BUN 14 02/16/2017 1450   BUN 19.5 01/19/2016 1141   CREATININE 1.04 (H) 10/17/2023 1442   CREATININE 1.2 02/16/2017 1450   CREATININE 0.9 01/19/2016 1141   CALCIUM  9.6 10/17/2023 1442   CALCIUM  8.8 02/16/2017 1450   CALCIUM  8.8 01/19/2016 1141   GFRNONAA 58 (L) 10/17/2023 1442   GFRAA >60 12/25/2019 1353   Lab Results  Component Value Date   HGBA1C 5.6 12/31/2023   HGBA1C 6.2 (H) 10/14/2007   Lab Results  Component Value Date   INSULIN  20.4 12/12/2022   INSULIN  12.6 05/19/2021   Lab Results  Component Value Date   TSH 1.82 07/24/2023   CBC    Component Value Date/Time   WBC 4.4 12/27/2023 1513   RBC 4.17 12/27/2023 1513   HGB 11.9 (L) 12/27/2023 1513   HGB 12.2 10/17/2023 1442   HGB 12.1 02/16/2017 1450   HCT 35.9 (L) 12/27/2023 1513   HCT 36.6 02/16/2017 1450   PLT 306.0 12/27/2023 1513   PLT 220 10/17/2023 1442   PLT 239 02/16/2017 1450   MCV 85.9 12/27/2023 1513   MCV 87 02/16/2017 1450   MCH 28.5 10/17/2023 1442   MCHC 33.1 12/27/2023 1513   RDW 15.2 12/27/2023 1513   RDW 15.4 02/16/2017 1450   Iron Studies    Component Value Date/Time   IRON 59 12/05/2021 1451   IRON 49 02/16/2017 1536   TIBC 323 12/05/2021 1451   TIBC 284 02/16/2017 1536   FERRITIN 51 12/05/2021 1452   FERRITIN 66 02/16/2017 1536   IRONPCTSAT 18 12/05/2021 1451   IRONPCTSAT 17 (L) 02/16/2017 1536   Lipid Panel     Component Value Date/Time   CHOL 115 06/08/2023 1422   TRIG 64 06/08/2023 1422   HDL 67 06/08/2023 1422   CHOLHDL 1.7 06/08/2023 1422   VLDL 9.8 06/05/2022 1320   LDLCALC 34 06/08/2023 1422   Hepatic Function  Panel     Component Value Date/Time   PROT 7.3 10/17/2023 1442   PROT 7.1 12/12/2022 1100   PROT 7.3 02/16/2017 1450   PROT 7.3 01/19/2016 1141   ALBUMIN 4.4 10/17/2023 1442   ALBUMIN 4.3 12/12/2022 1100   ALBUMIN 3.6 01/19/2016 1141   AST 29 10/17/2023 1442   AST 21 01/19/2016 1141   ALT 31 10/17/2023 1442   ALT 27 02/16/2017 1450   ALT 18 01/19/2016 1141   ALKPHOS 51 10/17/2023 1442   ALKPHOS 57 02/16/2017 1450   ALKPHOS 59 01/19/2016 1141   BILITOT 0.5 10/17/2023 1442   BILITOT 0.31 01/19/2016 1141   BILIDIR 0.0 01/08/2012 1224   IBILI 0.2 12/30/2010 1515      Component Value Date/Time   TSH 1.82 07/24/2023 1436   Nutritional Lab Results  Component Value Date   VD25OH 61.5 12/12/2022   VD25OH 42.2  05/19/2021    Medications: Outpatient Encounter Medications as of 01/28/2024  Medication Sig   acetaminophen  (TYLENOL ) 500 MG tablet Take 1 tablet (500 mg total) by mouth every 6 (six) hours as needed.   amoxicillin -clavulanate (AUGMENTIN ) 875-125 MG tablet Take 1 tablet by mouth 2 (two) times daily.   Apoaequorin (PREVAGEN) 10 MG CAPS Take 10 mg by mouth daily. prevagen   cycloSPORINE  (RESTASIS ) 0.05 % ophthalmic emulsion Place 1 drop into both eyes 2 (two) times daily.   hydrocortisone  valerate cream (WESTCORT ) 0.2 % Apply 1 Application topically daily as needed (itching).   hyoscyamine  (LEVSIN ) 0.125 MG tablet Take 1 tablet (0.125 mg total) by mouth every 4 hours as needed.   Multiple Vitamins-Minerals (MULTIVITAMIN WITH MINERALS) tablet Take 2 tablets by mouth daily. Vitafusion woman gummy   NEXIUM  40 MG capsule Take 1 capsule (40 mg total) by mouth daily before breakfast.   rosuvastatin  (CRESTOR ) 10 MG tablet Take 1 tablet (10 mg total) by mouth at bedtime.   temazepam  (RESTORIL ) 30 MG capsule TAKE ONE CAPSULE BY MOUTH AT BEDTIME AS NEEDED F SLEEP   triamterene -hydrochlorothiazide  (MAXZIDE -25) 37.5-25 MG tablet Take 0.5 tablets by mouth daily.   XARELTO  2.5 MG TABS  tablet TAKE TWO TABLETS BY MOUTH DAILY   [DISCONTINUED] tirzepatide  (MOUNJARO ) 10 MG/0.5ML Pen Inject 10 mg into the skin once a week.   tirzepatide  (MOUNJARO ) 10 MG/0.5ML Pen Inject 10 mg into the skin once a week.   No facility-administered encounter medications on file as of 01/28/2024.     Follow-Up   Return in about 3 weeks (around 02/18/2024) for Fasting and 30 minutes early for IC - 40 minute appointment if available.SABRA Jennifer [name] was informed of the importance of frequent follow up visits to maximize her success with intensive lifestyle modifications for her multiple health conditions.  Attestation Statement   Reviewed by clinician on day of visit: allergies, medications, problem list, medical history, surgical history, family history, social history, and previous encounter notes.     Lucas Parker, MD

## 2024-01-28 NOTE — Assessment & Plan Note (Signed)
 Esophageal stricture with dysphagia Increased difficulty swallowing solids, including soft foods like eggs, indicating possible esophageal stricture recurrence. Last dilation was over a year ago. Fear of pain from dilation procedure is causing avoidance of solid foods, impacting nutritional intake and weight management. Previous dilations have improved ability to consume solids and facilitated weight loss. - Refer to Dr. Shyrl for esophageal dilation. - Discuss with Dr. Nandigan about the need for dilation during upcoming appointment. - Consider modifying diet to include pureed or softer consistency foods to manage dysphagia symptoms.

## 2024-01-29 ENCOUNTER — Ambulatory Visit (INDEPENDENT_AMBULATORY_CARE_PROVIDER_SITE_OTHER): Admitting: Internal Medicine

## 2024-01-29 ENCOUNTER — Encounter: Payer: Self-pay | Admitting: Internal Medicine

## 2024-01-29 VITALS — BP 134/66 | HR 66 | Temp 97.8°F | Resp 16 | Ht 68.0 in | Wt 220.4 lb

## 2024-01-29 DIAGNOSIS — N6021 Fibroadenosis of right breast: Secondary | ICD-10-CM

## 2024-01-29 NOTE — Progress Notes (Signed)
 Subjective:    Patient ID: Jennifer [name], female    DOB: [birth year], 73 y.o.   MRN: [id number]  DOS:  01/29/2024 Acute  Discussed the use of AI scribe software for clinical note transcription with the patient, who gave verbal consent to proceed.  History of Present Illness  Right breast masses - Palpable lumps in the right breast first noticed one week ago - Several small lumps present - Lumps are non-tender unless manipulated - Associated with pruritus - No redness, swelling, nipple discharge, or bleeding - No recent breast injury  Breast cancer screening and surgical history - Last mammogram performed in February 2025 - History of breast reduction surgery   BP Readings from Last 3 Encounters:  01/29/24 134/66  01/28/24 134/83  12/31/23 131/78     Review of Systems See above   Past Medical History:  Diagnosis Date   Allergy    Anemia    Arthritis    Cataract    bil cataracts removed   Chronic chest pain    Chronic fatigue 02/03/2017   Clotting disorder    Pulmonary Embolis 2010 & 2015   Colon polyps    inflamed partially ulcerated hyperplastic polyp   Diabetes (HCC) 09/24/2013   no meds   Diverticulosis    Dysphagia esophageal phase - chronic after 3 fundoplications 01/20/2014   Edema of both lower extremities    Esophageal stenosis    Stricture after fundoplication REDO   Fibromyalgia    GERD (gastroesophageal reflux disease)    Glaucoma suspect    Heart murmur    no problems    Hemorrhoids, internal, with bleeding 06/27/2017   History of hiatal hernia    Hypertension    IBS (irritable bowel syndrome)    Lactose intolerance    Mitral valve prolapse    OSA (obstructive sleep apnea)    on CPAP-Mild - Sleep study 2004   Pre-diabetes    Pulmonary embolus (HCC)    2010 and 12-2013   Pulmonary nodule 12/13/2022   seen on CT chest xray on 12/16/22 - left upper lobe   Sleep apnea    wears CPAP   Thyroid  nodule      dr. watching    Tinnitus     Subjective   Zoster 2014   R flank    Past Surgical History:  Procedure Laterality Date   ABDOMINAL HYSTERECTOMY     still has 1 ovary    APPENDECTOMY     BRAIN SURGERY  1995   Stem surgery, Arnold Chiari Malformation   CATARACT EXTRACTION Bilateral 2017   cataracts, Dr. Cleatus   CHOLECYSTECTOMY     COLONOSCOPY     ESOPHAGEAL DILATION N/A 02/06/2023   Procedure: ESOPHAGEAL DILATION;  Surgeon: Shyrl Linnie KIDD, MD;  Location: [facility] OR;  Service: Thoracic;  Laterality: N/A;   ESOPHAGEAL DILATION N/A 02/13/2023   Procedure: ESOPHAGEAL DILATION;  Surgeon: Shyrl Linnie KIDD, MD;  Location: [facility] OR;  Service: Thoracic;  Laterality: N/A;   ESOPHAGEAL DILATION N/A 03/12/2023   Procedure: ESOPHAGEAL DILATION;  Surgeon: Shyrl Linnie KIDD, MD;  Location: [facility] OR;  Service: Thoracic;  Laterality: N/A;   ESOPHAGEAL MANOMETRY N/A 08/06/2019   Procedure: ESOPHAGEAL MANOMETRY (EM);  Surgeon: Avram Lupita BRAVO, MD;  Location: [facility];  Service: Endoscopy;  Laterality: N/A;   ESOPHAGOGASTRODUODENOSCOPY N/A 02/06/2023   Procedure: ESOPHAGOGASTRODUODENOSCOPY (EGD);  Surgeon: Shyrl Linnie KIDD, MD;  Location: [facility] OR;  Service: Thoracic;  Laterality: N/A;  With Savary dilation  ESOPHAGOGASTRODUODENOSCOPY N/A 02/13/2023   Procedure: ESOPHAGOGASTRODUODENOSCOPY (EGD);  Surgeon: Shyrl Linnie KIDD, MD;  Location: [facility] OR;  Service: Thoracic;  Laterality: N/A;   ESOPHAGOGASTRODUODENOSCOPY N/A 03/12/2023   Procedure: ESOPHAGOGASTRODUODENOSCOPY (EGD);  Surgeon: Shyrl Linnie KIDD, MD;  Location: [facility] OR;  Service: Thoracic;  Laterality: N/A;   HEMORRHOID BANDING     HERNIA REPAIR     Hital Hernia   JOINT REPLACEMENT Right    knee   KNEE ARTHROSCOPY  05/16/2012   Procedure: ARTHROSCOPY KNEE;  Surgeon: Rome JULIANNA Pepper, MD;  Location: [facility] OR;  Service: Orthopedics;  Laterality: Left;   KNEE ARTHROSCOPY Right 01/2018   NISSEN FUNDOPLICATION     X 3 lab, open x 2 last with mesh   POLYPECTOMY      REDUCTION MAMMAPLASTY Bilateral 2007   TOTAL KNEE ARTHROPLASTY Right 08/25/2019   Procedure: RIGHT TOTAL KNEE ARTHROPLASTY;  Surgeon: Melodi Lerner, MD;  Location: [facility] ORS;  Service: Orthopedics;  Laterality: Right;    UPPER GASTROINTESTINAL ENDOSCOPY      Current Outpatient Medications  Medication Instructions   acetaminophen  (TYLENOL ) 500 mg, Oral, Every 6 hours PRN   amoxicillin -clavulanate (AUGMENTIN ) 875-125 MG tablet 1 tablet, Oral, 2 times daily   cycloSPORINE  (RESTASIS ) 0.05 % ophthalmic emulsion 1 drop, 2 times daily   hydrocortisone  valerate cream (WESTCORT ) 0.2 % 1 Application, Daily PRN   hyoscyamine  (LEVSIN ) 0.125 MG tablet Take 1 tablet (0.125 mg total) by mouth every 4 hours as needed.   Multiple Vitamins-Minerals (MULTIVITAMIN WITH MINERALS) tablet 2 tablets, Daily   NexIUM  40 mg, Oral, Daily before breakfast   Prevagen 10 mg, Daily   rosuvastatin  (CRESTOR ) 10 mg, Oral, Daily at bedtime   temazepam  (RESTORIL ) 30 MG capsule TAKE ONE CAPSULE BY MOUTH AT BEDTIME AS NEEDED F SLEEP   tirzepatide  (MOUNJARO ) 10 mg, Subcutaneous, Weekly   triamterene -hydrochlorothiazide  (MAXZIDE -25) 37.5-25 MG tablet 0.5 tablets, Oral, Daily   Xarelto  5 mg, Oral, Daily       Objective:   Physical Exam BP 134/66   Pulse 66   Temp 97.8 F (36.6 C) (Oral)   Resp 16   Ht 5' 8 (1.727 m)   Wt 220 lb 6 oz (100 kg)   SpO2 93%   BMI 33.51 kg/m  General:   Well developed, NAD, BMI noted. HEENT:  Normocephalic . Face symmetric, atraumatic Breast exam: Well-healed surgical scar from previous breast reduction L: No dominant mass, multiple small lumps- feel normal.  Skin normal, nipples normal R: No dominant mass, multiple small lumps slightly more noticeable compared to the left.  Mild soreness to palpation.  Skin normal, nipple normal without discharge Skin: Not pale. Not jaundice Neurologic:  alert & oriented X3.  Speech normal, gait appropriate for age and unassisted Psych--   Cognition and judgment appear intact.  Cooperative with normal attention span and concentration.  Behavior appropriate. No anxious or depressed appearing.      Assessment     Assessment   DM, + neuropathy  HTN Anxiety, insomnia: On restoril  prn  Pulmonary: --OSA, sleep study 2004, on a CPAP --PEs:2010 and 12-2013 - saw hematology 12-2014, rec anticoag x 2 years (until 12-2015), then continue with a low-dose xarelto    life -- multiple pulmonary nodule, last CT 11-2013: No further imaging suggested --RAD, inhalers rarely Fibromyalgia CV:  MVP,  carotid US  1-39%s GI: --GERD,chronic dysphagia s/p  fundoplication 3, esophageal stenosis - April 2021: Barium study, esophageal manometry (see report) -- IBS --Hemorrhoids: S/p banding ~/2019 -- chronic right  flank,RUQ pain : since GB surgery 2008 Etiology not clear but likely multifactorial --> Adhesions from the surgery, neuropathy (DM), postherpetic neuralgia, scarring from PE, etc.  MRI T spine done 06-2014: DJD  Thyroid  nodule: Bx (non neoplastic goiter) 2014,  US  05-2016, 2022 (stable) Zoster --  2014 right flank  Assessment & Plan Mammogram

## 2024-01-29 NOTE — Assessment & Plan Note (Signed)
 Right breast lumps Multiple small lumps with pruritus and mild pain first noticed it last week. Exam confirms that the right breast is slightly more lumpy but otherwise it feels normal. She has a history of previous breast reduction. - Order diagnostic mammogram of the right breast. - Consider ultrasound of the right breast at radiologist's discretion.

## 2024-01-29 NOTE — Patient Instructions (Signed)
 Will arrange for diagnostic mammogram.

## 2024-01-30 ENCOUNTER — Encounter: Payer: Self-pay | Admitting: Gastroenterology

## 2024-01-30 ENCOUNTER — Telehealth: Payer: Self-pay | Admitting: *Deleted

## 2024-01-30 ENCOUNTER — Ambulatory Visit: Admitting: Gastroenterology

## 2024-01-30 ENCOUNTER — Other Ambulatory Visit: Payer: Self-pay | Admitting: Internal Medicine

## 2024-01-30 ENCOUNTER — Other Ambulatory Visit (INDEPENDENT_AMBULATORY_CARE_PROVIDER_SITE_OTHER)

## 2024-01-30 ENCOUNTER — Ambulatory Visit: Payer: Self-pay | Admitting: Gastroenterology

## 2024-01-30 ENCOUNTER — Other Ambulatory Visit: Payer: Self-pay | Admitting: Hematology & Oncology

## 2024-01-30 VITALS — BP 120/84 | HR 81 | Ht 68.0 in | Wt 220.0 lb

## 2024-01-30 DIAGNOSIS — K602 Anal fissure, unspecified: Secondary | ICD-10-CM

## 2024-01-30 DIAGNOSIS — R110 Nausea: Secondary | ICD-10-CM

## 2024-01-30 DIAGNOSIS — R1310 Dysphagia, unspecified: Secondary | ICD-10-CM | POA: Diagnosis not present

## 2024-01-30 DIAGNOSIS — K625 Hemorrhage of anus and rectum: Secondary | ICD-10-CM

## 2024-01-30 DIAGNOSIS — R1031 Right lower quadrant pain: Secondary | ICD-10-CM

## 2024-01-30 DIAGNOSIS — K219 Gastro-esophageal reflux disease without esophagitis: Secondary | ICD-10-CM

## 2024-01-30 DIAGNOSIS — D509 Iron deficiency anemia, unspecified: Secondary | ICD-10-CM

## 2024-01-30 DIAGNOSIS — R1319 Other dysphagia: Secondary | ICD-10-CM

## 2024-01-30 LAB — IBC PANEL
Iron: 66 ug/dL (ref 42–145)
Saturation Ratios: 19.7 % — ABNORMAL LOW (ref 20.0–50.0)
TIBC: 334.6 ug/dL (ref 250.0–450.0)
Transferrin: 239 mg/dL (ref 212.0–360.0)

## 2024-01-30 LAB — CBC
HCT: 38.9 % (ref 36.0–46.0)
Hemoglobin: 12.5 g/dL (ref 12.0–15.0)
MCHC: 32.2 g/dL (ref 30.0–36.0)
MCV: 87.6 fl (ref 78.0–100.0)
Platelets: 266 K/uL (ref 150.0–400.0)
RBC: 4.44 Mil/uL (ref 3.87–5.11)
RDW: 16.1 % — ABNORMAL HIGH (ref 11.5–15.5)
WBC: 3.7 K/uL — ABNORMAL LOW (ref 4.0–10.5)

## 2024-01-30 LAB — BASIC METABOLIC PANEL WITH GFR
BUN: 15 mg/dL (ref 6–23)
CO2: 33 meq/L — ABNORMAL HIGH (ref 19–32)
Calcium: 9.5 mg/dL (ref 8.4–10.5)
Chloride: 101 meq/L (ref 96–112)
Creatinine, Ser: 0.93 mg/dL (ref 0.40–1.20)
GFR: 62.01 mL/min (ref >60.00)
Glucose, Bld: 89 mg/dL (ref 70–99)
Potassium: 3.8 meq/L (ref 3.5–5.1)
Sodium: 142 meq/L (ref 135–145)

## 2024-01-30 MED ORDER — AMBULATORY NON FORMULARY MEDICATION: 0 refills | Status: AC

## 2024-01-30 MED ORDER — AMBULATORY NON FORMULARY MEDICATION: 0 refills | Status: DC

## 2024-01-30 NOTE — H&P (View-Only) (Signed)
 Jennifer [name]    [id number]    May 28, 1950  Primary Care Physician:Paz, Aloysius BRAVO, MD  Referring Physician: Amon Aloysius BRAVO, MD [address] [place],  [place] [address]   Chief complaint:  Dysphagia, blood in stool  Discussed the use of AI scribe software for clinical note transcription with the patient, who gave verbal consent to proceed.  History of Present Illness Jennifer [name] is a 73 year old female with GERD and dysphagia.She has history of chronic dysphagia, s/p Nissen fundoplication X3.  Dysphagia - Recurrent swallowing difficulties attributed to esophageal stricture /slipped Nissen fundoplication - History of esophageal dilations, initially performed weekly by Dr. Shyrl until fall of last year - Last dilation occurred around Thanksgiving; procedure was particularly painful - Managed without further intervention since last dilation, but now experiencing significant discomfort - Expresses that it is 'time for the stretching' again  Lower gastrointestinal bleeding and anemia - Low hemoglobin level of 11.9, decreased from baseline in the 12s - Ongoing rectal bleeding, particularly with bowel movements - Most recent episode of bleeding occurred this morning - Colonoscopy in 2022 with removal of a small polyp - Uses topical cream for hemorrhoids with temporary relief; symptoms recur after discontinuation  Abdominal pain and hernia symptoms - Intermittent abdominal pain described as a 'hard spot' in the stomach causing discomfort until it passes - Concern that pain may be related to hernia - Pain subsides once the 'spot' passes - Tylenol  and Levsin  (antispasmodic) provide symptomatic relief   EGD 09/05/22 - LA Grade C candidiasis esophagitis with no bleeding. Biopsied. - Benign-appearing esophageal stenosis. Dilated. - A Nissen fundoplication was found. The wrap  appears intact. - Normal stomach.  Normal examined duodenum.   CT ANGIO ABDOMEN PELVIS   W &/OR WO CONTRAST 07-14-22 VASCULAR 1. Mild compression on the left common iliac vein from the overlying iliac arteries. This can be a normal anatomic configuration. Based on the mild compression and the lack of significant venous collaterals, May-Thurner syndrome is unlikely. However, if there is high clinical concern, this could be further characterized with catheter directed venography and IVUS. Main pelvic veins are patent. 2. Arterial structures in the abdomen and pelvis are widely patent. NON-VASCULAR 1. No acute abnormality in the abdomen or pelvis. 2. Stable postsurgical changes. Mild intrahepatic and extrahepatic biliary dilatation has minimally changed and likely related to previous cholecystectomy. 3. Small ventral hernias containing fat. 4. Small hypodensities in the kidneys are too small to definitively characterize. These do not require dedicated follow-up.   EGD 05/02/2021 - LA Grade C esophagitis with no bleeding. Biopsied. - Esophageal mucosal changes suspicious for short-segment Barrett's esophagus. Biopsied. - A Nissen fundoplication was found. The wrap appears intact. - Gastritis. Biopsied. - Normal examined duodenum.   EGD 04-15-20 - Esophageal stenosis. Unable to dilate. Dilated. - Esophageal mucosal changes suspicious for short-segment Barrett's esophagus. Biopsied.  - A Nissen fundoplication was found. The wrap appears intact.  - Gastritis. Biopsied.  - Normal examined duodenum. 1. Surgical [P], gastric antrum, gastric body - GASTRIC ANTRAL MUCOSA WITH MILD REACTIVE GASTROPATHY. - GASTRIC OXYNTIC MUCOSA WITH MILD CHRONIC GASTRITIS. - INTESTINAL METAPLASIA IS PRESENT FOCALLY IN ANTRUM, NEGATIVE FOR DYSPLASIA. - WARTHIN-STARRY STAIN IS NEGATIVE FOR HELICOBACTER PYLORI. 2. Surgical [P], esophagus, GE junction - SQUAMOCOLUMNAR ESOPHAGEAL MUCOSA WITH REACTIVE/REGENERATIVE CHANGE. - NEGATIVE FOR INTESTINAL METAPLASIA (GOBLET CELL METAPLASIA).   Esophageal  manometry August 06, 2019: Findings suggestive of EG  junction outflow obstruction with intact peristalsis, negative for achalasia.   EGD September 28, 2016: It is post Nissen fundoplication with intact wrap, dilated with TTS balloon to 18 mm   Colonoscopy January 17, 2021 - One 4 mm polyp in the transverse colon, removed with a cold snare. Resected and retrieved. Clip (MR conditional) was placed. - Mild diverticulosis in the sigmoid colon, in the descending colon, in the transverse colon and in the ascending colon. - Non-bleeding external and internal hemorrhoids. - Anal fissure.   Colonoscopy December 14, 2015: She had removal of small sessile polyps, one SSP and one tubular adenoma.  Due for recall colonoscopy August 2022        Outpatient Encounter Medications as of 01/30/2024  Medication Sig   acetaminophen  (TYLENOL ) 500 MG tablet Take 1 tablet (500 mg total) by mouth every 6 (six) hours as needed.   Apoaequorin (PREVAGEN) 10 MG CAPS Take 10 mg by mouth daily. prevagen   cycloSPORINE  (RESTASIS ) 0.05 % ophthalmic emulsion Place 1 drop into both eyes 2 (two) times daily.   hydrocortisone  valerate cream (WESTCORT ) 0.2 % Apply 1 Application topically daily as needed (itching).   hyoscyamine  (LEVSIN ) 0.125 MG tablet Take 1 tablet (0.125 mg total) by mouth every 4 hours as needed.   Multiple Vitamins-Minerals (MULTIVITAMIN WITH MINERALS) tablet Take 2 tablets by mouth daily. Vitafusion woman gummy   NEXIUM  40 MG capsule Take 1 capsule (40 mg total) by mouth daily before breakfast.   rosuvastatin  (CRESTOR ) 10 MG tablet Take 1 tablet (10 mg total) by mouth at bedtime.   temazepam  (RESTORIL ) 30 MG capsule TAKE ONE CAPSULE BY MOUTH AT BEDTIME AS NEEDED F SLEEP   tirzepatide  (MOUNJARO ) 10 MG/0.5ML Pen Inject 10 mg into the skin once a week.   triamterene -hydrochlorothiazide  (MAXZIDE -25) 37.5-25 MG tablet Take 0.5 tablets by mouth daily.   XARELTO  2.5 MG TABS tablet TAKE TWO TABLETS BY MOUTH DAILY    [DISCONTINUED] amoxicillin -clavulanate (AUGMENTIN ) 875-125 MG tablet Take 1 tablet by mouth 2 (two) times daily. (Patient not taking: Reported on 01/29/2024)   No facility-administered encounter medications on file as of 01/30/2024.    Allergies as of 01/30/2024 - Review Complete 01/30/2024  Allergen Reaction Noted   Doxepin  Nausea And Vomiting and Other (See Comments) 06/18/2023   Morphine and codeine Anaphylaxis and Shortness Of Breath 12/01/2013   Sulfonamide derivatives Hives 12/02/2018   Promethazine hcl Other (See Comments) 12/02/2018    Past Medical History:  Diagnosis Date   Allergy    Anemia    Arthritis    Cataract    bil cataracts removed   Chronic chest pain    Chronic fatigue 02/03/2017   Clotting disorder    Pulmonary Embolis 2010 & 2015   Colon polyps    inflamed partially ulcerated hyperplastic polyp   Diabetes (HCC) 09/24/2013   no meds   Diverticulosis    Dysphagia esophageal phase - chronic after 3 fundoplications 01/20/2014   Edema of both lower extremities    Esophageal stenosis    Stricture after fundoplication REDO   Fibromyalgia    GERD (gastroesophageal reflux disease)    Glaucoma suspect    Heart murmur    no problems    Hemorrhoids, internal, with bleeding 06/27/2017   History of hiatal hernia    Hypertension    IBS (irritable bowel syndrome)    Lactose intolerance    Mitral valve prolapse    OSA (obstructive sleep apnea)    on CPAP-Mild - Sleep study 2004  Pre-diabetes    Pulmonary embolus (HCC)    2010 and 12-2013   Pulmonary nodule 12/13/2022   seen on CT chest xray on 12/16/22 - left upper lobe   Sleep apnea    wears CPAP   Thyroid  nodule      dr. watching    Tinnitus    Subjective   Zoster 2014   R flank    Past Surgical History:  Procedure Laterality Date   ABDOMINAL HYSTERECTOMY     still has 1 ovary    APPENDECTOMY     BRAIN SURGERY  1995   Stem surgery, Arnold Chiari Malformation   CATARACT EXTRACTION Bilateral 2017    cataracts, Dr. Cleatus   CHOLECYSTECTOMY     COLONOSCOPY     ESOPHAGEAL DILATION N/A 02/06/2023   Procedure: ESOPHAGEAL DILATION;  Surgeon: Shyrl Linnie KIDD, MD;  Location: [facility] OR;  Service: Thoracic;  Laterality: N/A;   ESOPHAGEAL DILATION N/A 02/13/2023   Procedure: ESOPHAGEAL DILATION;  Surgeon: Shyrl Linnie KIDD, MD;  Location: [facility] OR;  Service: Thoracic;  Laterality: N/A;   ESOPHAGEAL DILATION N/A 03/12/2023   Procedure: ESOPHAGEAL DILATION;  Surgeon: Shyrl Linnie KIDD, MD;  Location: [facility] OR;  Service: Thoracic;  Laterality: N/A;   ESOPHAGEAL MANOMETRY N/A 08/06/2019   Procedure: ESOPHAGEAL MANOMETRY (EM);  Surgeon: Avram Lupita BRAVO, MD;  Location: [facility];  Service: Endoscopy;  Laterality: N/A;   ESOPHAGOGASTRODUODENOSCOPY N/A 02/06/2023   Procedure: ESOPHAGOGASTRODUODENOSCOPY (EGD);  Surgeon: Shyrl Linnie KIDD, MD;  Location: [facility] OR;  Service: Thoracic;  Laterality: N/A;  With Savary dilation   ESOPHAGOGASTRODUODENOSCOPY N/A 02/13/2023   Procedure: ESOPHAGOGASTRODUODENOSCOPY (EGD);  Surgeon: Shyrl Linnie KIDD, MD;  Location: [facility] OR;  Service: Thoracic;  Laterality: N/A;   ESOPHAGOGASTRODUODENOSCOPY N/A 03/12/2023   Procedure: ESOPHAGOGASTRODUODENOSCOPY (EGD);  Surgeon: Shyrl Linnie KIDD, MD;  Location: [facility] OR;  Service: Thoracic;  Laterality: N/A;   HEMORRHOID BANDING     HERNIA REPAIR     Hital Hernia   JOINT REPLACEMENT Right    knee   KNEE ARTHROSCOPY  05/16/2012   Procedure: ARTHROSCOPY KNEE;  Surgeon: Rome JULIANNA Pepper, MD;  Location: [facility] OR;  Service: Orthopedics;  Laterality: Left;   KNEE ARTHROSCOPY Right 01/2018   NISSEN FUNDOPLICATION     X 3 lab, open x 2 last with mesh   POLYPECTOMY     REDUCTION MAMMAPLASTY Bilateral 2007   TOTAL KNEE ARTHROPLASTY Right 08/25/2019   Procedure: RIGHT TOTAL KNEE ARTHROPLASTY;  Surgeon: Melodi Lerner, MD;  Location: [facility] ORS;  Service: Orthopedics;  Laterality: Right;    UPPER GASTROINTESTINAL ENDOSCOPY       Family History  Problem Relation Age of Onset   Diabetes Mother    Rheum arthritis Mother    Hypertension Mother    Hypertension Father    Alcoholism Father    Heart disease Father    Sudden death Father    Rheum arthritis Sister    Hypertension Sister    Arthritis Sister    Rheum arthritis Sister    Rheum arthritis Sister    CAD Sister        MI age 50   Fibromyalgia Sister    Colon cancer Maternal Grandmother        dx with colon ca laster in life - lived to be 104   Crohn's disease Maternal Aunt    Stomach cancer Maternal Uncle    Lung cancer Maternal Uncle    Breast cancer Neg Hx    Esophageal cancer Neg  Hx    Rectal cancer Neg Hx    Prostate cancer Neg Hx    Pancreatic cancer Neg Hx    Colon polyps Neg Hx     Social History   Socioeconomic History   Marital status: Married    Spouse name: Neomia   Number of children: 1   Years of education: Not on file   Highest education level: Associate degree: occupational, Scientist, product/process development, or vocational program  Occupational History   Occupation: retired form the [employer] 04-2015    Employer: [employer]  Tobacco Use   Smoking status: Never   Smokeless tobacco: Never   Tobacco comments:    never used tobacco  Vaping Use   Vaping status: Never Used  Substance and Sexual Activity   Alcohol use: No    Alcohol/week: 0.0 standard drinks of alcohol   Drug use: No   Sexual activity: Not Currently    Birth control/protection: Surgical    Comment: Hysterectomy  Other Topics Concern   Not on file  Social History Narrative   [employer] Graduate - Technical school   Married '70 for 2 years, divorced; re-married '86 for 2.5 years, divorced; re-married '90 for 1 year, divorced; re-married '00   1 son - '42   Sister - Died @ 55 [place]       Social Drivers of Corporate investment banker Strain: Low Risk  (12/05/2023)   Overall Financial Resource Strain (CARDIA)    Difficulty of Paying Living Expenses: Not hard at all  Food  Insecurity: No Food Insecurity (12/06/2023)   Hunger Vital Sign    Worried About Running Out of Food in the Last Year: Never true    Ran Out of Food in the Last Year: Never true  Transportation Needs: No Transportation Needs (12/05/2023)   PRAPARE - Administrator, Civil Service (Medical): No    Lack of Transportation (Non-Medical): No  Physical Activity: Insufficiently Active (12/05/2023)   Exercise Vital Sign    Days of Exercise per Week: 2 days    Minutes of Exercise per Session: 40 min  Stress: No Stress Concern Present (12/05/2023)   [employer] of Occupational Health - Occupational Stress Questionnaire    Feeling of Stress: Only a little  Social Connections: Socially Integrated (12/05/2023)   Social Connection and Isolation Panel    Frequency of Communication with Friends and Family: More than three times a week    Frequency of Social Gatherings with Friends and Family: Twice a week    Attends Religious Services: More than 4 times per year    Active Member of [employer] or Organizations: Yes    Attends Engineer, structural: More than 4 times per year    Marital Status: Married  Catering manager Violence: Not At Risk (09/05/2023)   Humiliation, Afraid, Rape, and Kick questionnaire    Fear of Current or Ex-Partner: No    Emotionally Abused: No    Physically Abused: No    Sexually Abused: No      Review of systems: All other review of systems negative except as mentioned in the HPI.   Physical Exam: Vitals:   01/30/24 1058  BP: 120/84  Pulse: 81   Body mass index is 33.45 kg/m. Gen:      No acute distress HEENT:  sclera anicteric CV: s1s2 rrr, no murmur Lungs: B/l clear. Abd:      soft, right lower quadrant-tender; no palpable masses, no distension Ext:  No edema Neuro: alert and oriented x 3 Psych: normal mood and affect Rectal exam: Normal anal sphincter tone, no  external hemorrhoids Anoscopy: Small internal hemorrhoids, posterior anal  fissure, no active bleeding, normal dentate line, no visible nodules   Data Reviewed:  Reviewed labs, radiology imaging, old records and pertinent past GI work up   Assessment and Plan Assessment & Plan 73 year old very pleasant female with history of chronic GERD, s/p Nissen fundoplication, history of adenomatous colon polyps, history of DVT and PE on chronic anticoagulation [Xarelto ] with complaints of solid dysphagia, associated with retrosternal discomfort and regurgitation   She has lower esophageal stasis likely secondary to tight Nissen fundoplication, has increased LES pressure consistent with esophageal gastric  junction outlet obstruction, has intact peristalsis, she had mesh hiatal hernia repair and redo Nissen fundoplication in 2003.  She has had multiple esophageal dilations since then here and also previously at [facility], no significant improvement  Esophageal stricture requiring dilation Reports dysphagia and indicates the need for another dilation. Last dilation was last Thanksgiving and was painful. Multiple dilations may be necessary due to the time elapsed since the last procedure. - Schedule EGD with esophageal dilation    Anal fissure with bleeding Anal fissure on rectal exam, history of constipation with hard stool likely etiology for recurrent anal fissure Use 0.125% nitroglycerin small pea-sized amount per rectum 3 times daily for 6 to 8 weeks Benefiber 1 tablespoon 3 times daily with meals MiraLAX  1 capful daily to prevent constipation If continues to have persistent bleeding, will consider colonoscopy for further evaluation  Iron deficiency anemia suspected secondary to chronic blood loss Hemoglobin is slightly below normal at 11.9. Bleeding from hemorrhoids may contribute to iron deficiency anemia. - Recheck CBC, BMP - Check iron levels  Complains of severe lower abdominal pain on the right side, she sometimes notices bulge, improves to some  extent with bowel movement but is constantly present, was tender on exam Will obtain CT abdomen pelvis with contrast for further evaluation, exclude internal hernia  Return in 1 to 2 months for follow-up  This visit required >40 minutes of patient care (this includes precharting, chart review, review of results, face-to-face time used for counseling as well as treatment plan and follow-up. The patient was provided an opportunity to ask questions and all were answered. The patient agreed with the plan and demonstrated an understanding of the instructions.  Jennifer [name] , MD    CC: Amon Aloysius BRAVO, MD

## 2024-01-30 NOTE — Telephone Encounter (Signed)
 Dr. Timmy prescribes Xarelto , forwarded to him.

## 2024-01-30 NOTE — Patient Instructions (Signed)
 VISIT SUMMARY:  We have sent a prescription for nitroglycerin 0.125% gel to [facility]. You should apply a pea size amount to your rectum three times daily x 6-8 weeks.  [facility]'s information is below: Address: [address] [place], [place] [address]  [phone]  *Please DO NOT go directly from our office to pick up this medication! Give the pharmacy 1 day to process the prescription as this is compounded and takes time to make.   During your visit, we discussed your swallowing difficulties, low hemoglobin levels, and abdominal pain. We have planned further evaluations and treatments to address these issues.  YOUR PLAN:  ESOPHAGEAL STRICTURE REQUIRING DILATION: You are experiencing difficulty swallowing due to esophageal strictures and need another dilation procedure. -We will schedule an esophageal dilation  -You may need multiple dilations due to the time elapsed since your last procedure.  Anal fissure WITH BLEEDING: You have ongoing bleeding from hemorrhoids, which may be contributing to your low hemoglobin levels. 0.125% Nitroglycerine small pea size per rectum three times daily Benefiber 1 tablespoon three times daily with meals Miralax  1 capful daily to prevent constipation  IRON DEFICIENCY ANEMIA SUSPECTED SECONDARY TO CHRONIC BLOOD LOSS: Your hemoglobin level is slightly below normal, possibly due to chronic blood loss from hemorrhoids. -We will recheck your complete blood count (CBC). -We will check your iron levels.  Lower abdominal pain, severe with nausea Will obtain CT abd & pelvis with contrast, BMP -Continue using Levsin  for abdominal spasms  You have been scheduled for a CT scan of the abdomen and pelvis at [facility], 1st floor Radiology. You are scheduled on 02/07/2024 at 4:00 pm . You should arrive 15 minutes prior to your appointment time for registration.    Your provider has requested that you go to the basement  level for lab work before leaving today. Press B on the elevator. The lab is located at the first door on the left as you exit the elevator.   Due to recent changes in healthcare laws, you may see the results of your imaging and laboratory studies on MyChart before your provider has had a chance to review them.  We understand that in some cases there may be results that are confusing or concerning to you. Not all laboratory results come back in the same time frame and the provider may be waiting for multiple results in order to interpret others.  Please give us  48 hours in order for your provider to thoroughly review all the results before contacting the office for clarification of your results.    I appreciate the  opportunity to care for you  Thank You   Kavitha Nandigam , MD

## 2024-01-30 NOTE — Telephone Encounter (Signed)
 Spoke with patient that she is ok to hold her xarelto  2 days before her procedure and also reminded her not to take her Mounjaro  on Monday. She wrote all the information down as we spoke

## 2024-01-30 NOTE — Progress Notes (Signed)
 Jennifer [name]    [id number]    May 28, 1950  Primary Care Physician:Paz, Aloysius BRAVO, MD  Referring Physician: Amon Aloysius BRAVO, MD [address] [place],  [place] [address]   Chief complaint:  Dysphagia, blood in stool  Discussed the use of AI scribe software for clinical note transcription with the patient, who gave verbal consent to proceed.  History of Present Illness Jennifer [name] is a 73 year old female with GERD and dysphagia.She has history of chronic dysphagia, s/p Nissen fundoplication X3.  Dysphagia - Recurrent swallowing difficulties attributed to esophageal stricture /slipped Nissen fundoplication - History of esophageal dilations, initially performed weekly by Dr. Shyrl until fall of last year - Last dilation occurred around Thanksgiving; procedure was particularly painful - Managed without further intervention since last dilation, but now experiencing significant discomfort - Expresses that it is 'time for the stretching' again  Lower gastrointestinal bleeding and anemia - Low hemoglobin level of 11.9, decreased from baseline in the 12s - Ongoing rectal bleeding, particularly with bowel movements - Most recent episode of bleeding occurred this morning - Colonoscopy in 2022 with removal of a small polyp - Uses topical cream for hemorrhoids with temporary relief; symptoms recur after discontinuation  Abdominal pain and hernia symptoms - Intermittent abdominal pain described as a 'hard spot' in the stomach causing discomfort until it passes - Concern that pain may be related to hernia - Pain subsides once the 'spot' passes - Tylenol  and Levsin  (antispasmodic) provide symptomatic relief   EGD 09/05/22 - LA Grade C candidiasis esophagitis with no bleeding. Biopsied. - Benign-appearing esophageal stenosis. Dilated. - A Nissen fundoplication was found. The wrap  appears intact. - Normal stomach.  Normal examined duodenum.   CT ANGIO ABDOMEN PELVIS   W &/OR WO CONTRAST 07-14-22 VASCULAR 1. Mild compression on the left common iliac vein from the overlying iliac arteries. This can be a normal anatomic configuration. Based on the mild compression and the lack of significant venous collaterals, May-Thurner syndrome is unlikely. However, if there is high clinical concern, this could be further characterized with catheter directed venography and IVUS. Main pelvic veins are patent. 2. Arterial structures in the abdomen and pelvis are widely patent. NON-VASCULAR 1. No acute abnormality in the abdomen or pelvis. 2. Stable postsurgical changes. Mild intrahepatic and extrahepatic biliary dilatation has minimally changed and likely related to previous cholecystectomy. 3. Small ventral hernias containing fat. 4. Small hypodensities in the kidneys are too small to definitively characterize. These do not require dedicated follow-up.   EGD 05/02/2021 - LA Grade C esophagitis with no bleeding. Biopsied. - Esophageal mucosal changes suspicious for short-segment Barrett's esophagus. Biopsied. - A Nissen fundoplication was found. The wrap appears intact. - Gastritis. Biopsied. - Normal examined duodenum.   EGD 04-15-20 - Esophageal stenosis. Unable to dilate. Dilated. - Esophageal mucosal changes suspicious for short-segment Barrett's esophagus. Biopsied.  - A Nissen fundoplication was found. The wrap appears intact.  - Gastritis. Biopsied.  - Normal examined duodenum. 1. Surgical [P], gastric antrum, gastric body - GASTRIC ANTRAL MUCOSA WITH MILD REACTIVE GASTROPATHY. - GASTRIC OXYNTIC MUCOSA WITH MILD CHRONIC GASTRITIS. - INTESTINAL METAPLASIA IS PRESENT FOCALLY IN ANTRUM, NEGATIVE FOR DYSPLASIA. - WARTHIN-STARRY STAIN IS NEGATIVE FOR HELICOBACTER PYLORI. 2. Surgical [P], esophagus, GE junction - SQUAMOCOLUMNAR ESOPHAGEAL MUCOSA WITH REACTIVE/REGENERATIVE CHANGE. - NEGATIVE FOR INTESTINAL METAPLASIA (GOBLET CELL METAPLASIA).   Esophageal  manometry August 06, 2019: Findings suggestive of EG  junction outflow obstruction with intact peristalsis, negative for achalasia.   EGD September 28, 2016: It is post Nissen fundoplication with intact wrap, dilated with TTS balloon to 18 mm   Colonoscopy January 17, 2021 - One 4 mm polyp in the transverse colon, removed with a cold snare. Resected and retrieved. Clip (MR conditional) was placed. - Mild diverticulosis in the sigmoid colon, in the descending colon, in the transverse colon and in the ascending colon. - Non-bleeding external and internal hemorrhoids. - Anal fissure.   Colonoscopy December 14, 2015: She had removal of small sessile polyps, one SSP and one tubular adenoma.  Due for recall colonoscopy August 2022        Outpatient Encounter Medications as of 01/30/2024  Medication Sig   acetaminophen  (TYLENOL ) 500 MG tablet Take 1 tablet (500 mg total) by mouth every 6 (six) hours as needed.   Apoaequorin (PREVAGEN) 10 MG CAPS Take 10 mg by mouth daily. prevagen   cycloSPORINE  (RESTASIS ) 0.05 % ophthalmic emulsion Place 1 drop into both eyes 2 (two) times daily.   hydrocortisone  valerate cream (WESTCORT ) 0.2 % Apply 1 Application topically daily as needed (itching).   hyoscyamine  (LEVSIN ) 0.125 MG tablet Take 1 tablet (0.125 mg total) by mouth every 4 hours as needed.   Multiple Vitamins-Minerals (MULTIVITAMIN WITH MINERALS) tablet Take 2 tablets by mouth daily. Vitafusion woman gummy   NEXIUM  40 MG capsule Take 1 capsule (40 mg total) by mouth daily before breakfast.   rosuvastatin  (CRESTOR ) 10 MG tablet Take 1 tablet (10 mg total) by mouth at bedtime.   temazepam  (RESTORIL ) 30 MG capsule TAKE ONE CAPSULE BY MOUTH AT BEDTIME AS NEEDED F SLEEP   tirzepatide  (MOUNJARO ) 10 MG/0.5ML Pen Inject 10 mg into the skin once a week.   triamterene -hydrochlorothiazide  (MAXZIDE -25) 37.5-25 MG tablet Take 0.5 tablets by mouth daily.   XARELTO  2.5 MG TABS tablet TAKE TWO TABLETS BY MOUTH DAILY    [DISCONTINUED] amoxicillin -clavulanate (AUGMENTIN ) 875-125 MG tablet Take 1 tablet by mouth 2 (two) times daily. (Patient not taking: Reported on 01/29/2024)   No facility-administered encounter medications on file as of 01/30/2024.    Allergies as of 01/30/2024 - Review Complete 01/30/2024  Allergen Reaction Noted   Doxepin  Nausea And Vomiting and Other (See Comments) 06/18/2023   Morphine and codeine Anaphylaxis and Shortness Of Breath 12/01/2013   Sulfonamide derivatives Hives 12/02/2018   Promethazine hcl Other (See Comments) 12/02/2018    Past Medical History:  Diagnosis Date   Allergy    Anemia    Arthritis    Cataract    bil cataracts removed   Chronic chest pain    Chronic fatigue 02/03/2017   Clotting disorder    Pulmonary Embolis 2010 & 2015   Colon polyps    inflamed partially ulcerated hyperplastic polyp   Diabetes (HCC) 09/24/2013   no meds   Diverticulosis    Dysphagia esophageal phase - chronic after 3 fundoplications 01/20/2014   Edema of both lower extremities    Esophageal stenosis    Stricture after fundoplication REDO   Fibromyalgia    GERD (gastroesophageal reflux disease)    Glaucoma suspect    Heart murmur    no problems    Hemorrhoids, internal, with bleeding 06/27/2017   History of hiatal hernia    Hypertension    IBS (irritable bowel syndrome)    Lactose intolerance    Mitral valve prolapse    OSA (obstructive sleep apnea)    on CPAP-Mild - Sleep study 2004  Pre-diabetes    Pulmonary embolus (HCC)    2010 and 12-2013   Pulmonary nodule 12/13/2022   seen on CT chest xray on 12/16/22 - left upper lobe   Sleep apnea    wears CPAP   Thyroid  nodule      dr. watching    Tinnitus    Subjective   Zoster 2014   R flank    Past Surgical History:  Procedure Laterality Date   ABDOMINAL HYSTERECTOMY     still has 1 ovary    APPENDECTOMY     BRAIN SURGERY  1995   Stem surgery, Arnold Chiari Malformation   CATARACT EXTRACTION Bilateral 2017    cataracts, Dr. Cleatus   CHOLECYSTECTOMY     COLONOSCOPY     ESOPHAGEAL DILATION N/A 02/06/2023   Procedure: ESOPHAGEAL DILATION;  Surgeon: Shyrl Linnie KIDD, MD;  Location: [facility] OR;  Service: Thoracic;  Laterality: N/A;   ESOPHAGEAL DILATION N/A 02/13/2023   Procedure: ESOPHAGEAL DILATION;  Surgeon: Shyrl Linnie KIDD, MD;  Location: [facility] OR;  Service: Thoracic;  Laterality: N/A;   ESOPHAGEAL DILATION N/A 03/12/2023   Procedure: ESOPHAGEAL DILATION;  Surgeon: Shyrl Linnie KIDD, MD;  Location: [facility] OR;  Service: Thoracic;  Laterality: N/A;   ESOPHAGEAL MANOMETRY N/A 08/06/2019   Procedure: ESOPHAGEAL MANOMETRY (EM);  Surgeon: Avram Lupita BRAVO, MD;  Location: [facility];  Service: Endoscopy;  Laterality: N/A;   ESOPHAGOGASTRODUODENOSCOPY N/A 02/06/2023   Procedure: ESOPHAGOGASTRODUODENOSCOPY (EGD);  Surgeon: Shyrl Linnie KIDD, MD;  Location[facility] OR;  Service: Thoracic;  Laterality: N/A;  With Savary dilation   ESOPHAGOGASTRODUODENOSCOPY N/A 02/13/2023   Procedure: ESOPHAGOGASTRODUODENOSCOPY (EGD);  Surgeon: Shyrl Linnie KIDD, MD;  Location: [facility] OR;  Service: Thoracic;  Laterality: N/A;   ESOPHAGOGASTRODUODENOSCOPY N/A 03/12/2023   Procedure: ESOPHAGOGASTRODUODENOSCOPY (EGD);  Surgeon: Shyrl Linnie KIDD, MD;  Location: [facility] OR;  Service: Thoracic;  Laterality: N/A;   HEMORRHOID BANDING     HERNIA REPAIR     Hital Hernia   JOINT REPLACEMENT Right    knee   KNEE ARTHROSCOPY  05/16/2012   Procedure: ARTHROSCOPY KNEE;  Surgeon: Rome JULIANNA Pepper, MD;  Location: [facility] OR;  Service: Orthopedics;  Laterality: Left;   KNEE ARTHROSCOPY Right 01/2018   NISSEN FUNDOPLICATION     X 3 lab, open x 2 last with mesh   POLYPECTOMY     REDUCTION MAMMAPLASTY Bilateral 2007   TOTAL KNEE ARTHROPLASTY Right 08/25/2019   Procedure: RIGHT TOTAL KNEE ARTHROPLASTY;  Surgeon: Melodi Lerner, MD;  Location: [facility] ORS;  Service: Orthopedics;  Laterality: Right;    UPPER GASTROINTESTINAL ENDOSCOPY       Family History  Problem Relation Age of Onset   Diabetes Mother    Rheum arthritis Mother    Hypertension Mother    Hypertension Father    Alcoholism Father    Heart disease Father    Sudden death Father    Rheum arthritis Sister    Hypertension Sister    Arthritis Sister    Rheum arthritis Sister    Rheum arthritis Sister    CAD Sister        MI age 50   Fibromyalgia Sister    Colon cancer Maternal Grandmother        dx with colon ca laster in life - lived to be 104   Crohn's disease Maternal Aunt    Stomach cancer Maternal Uncle    Lung cancer Maternal Uncle    Breast cancer Neg Hx    Esophageal cancer Neg  Hx    Rectal cancer Neg Hx    Prostate cancer Neg Hx    Pancreatic cancer Neg Hx    Colon polyps Neg Hx     Social History   Socioeconomic History   Marital status: Married    Spouse name: Neomia   Number of children: 1   Years of education: Not on file   Highest education level: Associate degree: occupational, Scientist, product/process development, or vocational program  Occupational History   Occupation: retired form the [employer] 04-2015    Employer: [employer]  Tobacco Use   Smoking status: Never   Smokeless tobacco: Never   Tobacco comments:    never used tobacco  Vaping Use   Vaping status: Never Used  Substance and Sexual Activity   Alcohol use: No    Alcohol/week: 0.0 standard drinks of alcohol   Drug use: No   Sexual activity: Not Currently    Birth control/protection: Surgical    Comment: Hysterectomy  Other Topics Concern   Not on file  Social History Narrative   [employer] Graduate - Technical school   Married '70 for 2 years, divorced; re-married '86 for 2.5 years, divorced; re-married '90 for 1 year, divorced; re-married '00   1 son - '42   Sister - Died @ 55 [place]       Social Drivers of Corporate investment banker Strain: Low Risk  (12/05/2023)   Overall Financial Resource Strain (CARDIA)    Difficulty of Paying Living Expenses: Not hard at all  Food  Insecurity: No Food Insecurity (12/06/2023)   Hunger Vital Sign    Worried About Running Out of Food in the Last Year: Never true    Ran Out of Food in the Last Year: Never true  Transportation Needs: No Transportation Needs (12/05/2023)   PRAPARE - Administrator, Civil Service (Medical): No    Lack of Transportation (Non-Medical): No  Physical Activity: Insufficiently Active (12/05/2023)   Exercise Vital Sign    Days of Exercise per Week: 2 days    Minutes of Exercise per Session: 40 min  Stress: No Stress Concern Present (12/05/2023)   [employer] of Occupational Health - Occupational Stress Questionnaire    Feeling of Stress: Only a little  Social Connections: Socially Integrated (12/05/2023)   Social Connection and Isolation Panel    Frequency of Communication with Friends and Family: More than three times a week    Frequency of Social Gatherings with Friends and Family: Twice a week    Attends Religious Services: More than 4 times per year    Active Member of [employer] or Organizations: Yes    Attends Engineer, structural: More than 4 times per year    Marital Status: Married  Catering manager Violence: Not At Risk (09/05/2023)   Humiliation, Afraid, Rape, and Kick questionnaire    Fear of Current or Ex-Partner: No    Emotionally Abused: No    Physically Abused: No    Sexually Abused: No      Review of systems: All other review of systems negative except as mentioned in the HPI.   Physical Exam: Vitals:   01/30/24 1058  BP: 120/84  Pulse: 81   Body mass index is 33.45 kg/m. Gen:      No acute distress HEENT:  sclera anicteric CV: s1s2 rrr, no murmur Lungs: B/l clear. Abd:      soft, right lower quadrant-tender; no palpable masses, no distension Ext:  No edema Neuro: alert and oriented x 3 Psych: normal mood and affect Rectal exam: Normal anal sphincter tone, no  external hemorrhoids Anoscopy: Small internal hemorrhoids, posterior anal  fissure, no active bleeding, normal dentate line, no visible nodules   Data Reviewed:  Reviewed labs, radiology imaging, old records and pertinent past GI work up   Assessment and Plan Assessment & Plan 73 year old very pleasant female with history of chronic GERD, s/p Nissen fundoplication, history of adenomatous colon polyps, history of DVT and PE on chronic anticoagulation [Xarelto ] with complaints of solid dysphagia, associated with retrosternal discomfort and regurgitation   She has lower esophageal stasis likely secondary to tight Nissen fundoplication, has increased LES pressure consistent with esophageal gastric  junction outlet obstruction, has intact peristalsis, she had mesh hiatal hernia repair and redo Nissen fundoplication in 2003.  She has had multiple esophageal dilations since then here and also previously at [facility], no significant improvement  Esophageal stricture requiring dilation Reports dysphagia and indicates the need for another dilation. Last dilation was last Thanksgiving and was painful. Multiple dilations may be necessary due to the time elapsed since the last procedure. - Schedule EGD with esophageal dilation    Anal fissure with bleeding Anal fissure on rectal exam, history of constipation with hard stool likely etiology for recurrent anal fissure Use 0.125% nitroglycerin small pea-sized amount per rectum 3 times daily for 6 to 8 weeks Benefiber 1 tablespoon 3 times daily with meals MiraLAX  1 capful daily to prevent constipation If continues to have persistent bleeding, will consider colonoscopy for further evaluation  Iron deficiency anemia suspected secondary to chronic blood loss Hemoglobin is slightly below normal at 11.9. Bleeding from hemorrhoids may contribute to iron deficiency anemia. - Recheck CBC, BMP - Check iron levels  Complains of severe lower abdominal pain on the right side, she sometimes notices bulge, improves to some  extent with bowel movement but is constantly present, was tender on exam Will obtain CT abdomen pelvis with contrast for further evaluation, exclude internal hernia  Return in 1 to 2 months for follow-up  This visit required >40 minutes of patient care (this includes precharting, chart review, review of results, face-to-face time used for counseling as well as treatment plan and follow-up. The patient was provided an opportunity to ask questions and all were answered. The patient agreed with the plan and demonstrated an understanding of the instructions.  Jennifer [name] , MD    CC: Amon Aloysius BRAVO, MD

## 2024-01-30 NOTE — Telephone Encounter (Addendum)
  Jennifer [name] 1950-10-13 [id number]  01/30/2024   Dear Dr Maude Crease : or Dr Amon   We have scheduled the above named patient for a(n) EGD procedure. Our records show that (s)he is on anticoagulation therapy.  Please advise as to whether the patient may come off their therapy of Xarelto  2 days prior to their procedure which is scheduled for 02/04/2024.  Please route your response to Grayce Loge CMA or fax response to [phone].  Sincerely,    [facility]

## 2024-01-30 NOTE — Telephone Encounter (Signed)
 Dr Timmy   please advise  Patients procedure is Monday    Thanks

## 2024-01-31 ENCOUNTER — Encounter: Payer: Self-pay | Admitting: Family

## 2024-01-31 NOTE — Telephone Encounter (Signed)
 Requesting: temazepam  30mg   Contract:06/13/22 UDS:06/08/23 Last Visit: 01/29/24 Next Visit:04/18/24 Last Refill: 10/01/23 #30 and 3RF  Please Advise

## 2024-01-31 NOTE — Telephone Encounter (Signed)
 PDMP Okay, Rx sent

## 2024-02-04 ENCOUNTER — Encounter (HOSPITAL_COMMUNITY): Payer: Self-pay | Admitting: Gastroenterology

## 2024-02-04 ENCOUNTER — Other Ambulatory Visit: Payer: Self-pay

## 2024-02-04 ENCOUNTER — Ambulatory Visit (HOSPITAL_COMMUNITY)
Admission: RE | Admit: 2024-02-04 | Discharge: 2024-02-04 | Disposition: A | Discharge status: Home / Self Care | Attending: Gastroenterology | Admitting: Gastroenterology

## 2024-02-04 ENCOUNTER — Ambulatory Visit (HOSPITAL_COMMUNITY): Admitting: Registered Nurse

## 2024-02-04 ENCOUNTER — Encounter (HOSPITAL_COMMUNITY)
Admission: RE | Disposition: A | Discharge status: Home / Self Care | Payer: Self-pay | Source: Home / Self Care | Attending: Gastroenterology

## 2024-02-04 DIAGNOSIS — K449 Diaphragmatic hernia without obstruction or gangrene: Secondary | ICD-10-CM | POA: Insufficient documentation

## 2024-02-04 DIAGNOSIS — K222 Esophageal obstruction: Secondary | ICD-10-CM | POA: Diagnosis not present

## 2024-02-04 DIAGNOSIS — Z79899 Other long term (current) drug therapy: Secondary | ICD-10-CM | POA: Diagnosis not present

## 2024-02-04 DIAGNOSIS — M797 Fibromyalgia: Secondary | ICD-10-CM | POA: Diagnosis not present

## 2024-02-04 DIAGNOSIS — Z9889 Other specified postprocedural states: Secondary | ICD-10-CM | POA: Diagnosis not present

## 2024-02-04 DIAGNOSIS — G4733 Obstructive sleep apnea (adult) (pediatric): Secondary | ICD-10-CM | POA: Insufficient documentation

## 2024-02-04 DIAGNOSIS — Z7985 Long-term (current) use of injectable non-insulin antidiabetic drugs: Secondary | ICD-10-CM | POA: Insufficient documentation

## 2024-02-04 DIAGNOSIS — R1310 Dysphagia, unspecified: Secondary | ICD-10-CM | POA: Insufficient documentation

## 2024-02-04 DIAGNOSIS — I10 Essential (primary) hypertension: Secondary | ICD-10-CM | POA: Diagnosis not present

## 2024-02-04 DIAGNOSIS — M1990 Unspecified osteoarthritis, unspecified site: Secondary | ICD-10-CM | POA: Diagnosis not present

## 2024-02-04 DIAGNOSIS — F419 Anxiety disorder, unspecified: Secondary | ICD-10-CM | POA: Diagnosis not present

## 2024-02-04 DIAGNOSIS — T85591A Other mechanical complication of esophageal anti-reflux device, initial encounter: Secondary | ICD-10-CM

## 2024-02-04 DIAGNOSIS — R519 Headache, unspecified: Secondary | ICD-10-CM | POA: Diagnosis not present

## 2024-02-04 DIAGNOSIS — Z7901 Long term (current) use of anticoagulants: Secondary | ICD-10-CM | POA: Diagnosis not present

## 2024-02-04 DIAGNOSIS — K219 Gastro-esophageal reflux disease without esophagitis: Secondary | ICD-10-CM | POA: Diagnosis not present

## 2024-02-04 DIAGNOSIS — E119 Type 2 diabetes mellitus without complications: Secondary | ICD-10-CM | POA: Diagnosis not present

## 2024-02-04 DIAGNOSIS — D649 Anemia, unspecified: Secondary | ICD-10-CM | POA: Insufficient documentation

## 2024-02-04 DIAGNOSIS — R1319 Other dysphagia: Secondary | ICD-10-CM

## 2024-02-04 HISTORY — PX: ESOPHAGOGASTRODUODENOSCOPY: SHX5428

## 2024-02-04 SURGERY — EGD (ESOPHAGOGASTRODUODENOSCOPY)
Anesthesia: Monitor Anesthesia Care

## 2024-02-04 MED ORDER — PHENYLEPHRINE HCL (PRESSORS) 10 MG/ML IV SOLN
INTRAVENOUS | Status: DC | PRN
Start: 2024-02-04 — End: 2024-02-04
  Administered 2024-02-04 (×2): 100 ug via INTRAVENOUS

## 2024-02-04 MED ORDER — PROPOFOL 10 MG/ML IV BOLUS
INTRAVENOUS | Status: DC | PRN
  Administered 2024-02-04: 100 mg via INTRAVENOUS
  Administered 2024-02-04: 30 mg via INTRAVENOUS

## 2024-02-04 MED ORDER — PROPOFOL 500 MG/50ML IV EMUL
INTRAVENOUS | Status: DC | PRN
  Administered 2024-02-04: 100 ug/kg/min via INTRAVENOUS

## 2024-02-04 MED ORDER — LIDOCAINE 2% (20 MG/ML) 5 ML SYRINGE
INTRAMUSCULAR | Status: DC | PRN
  Administered 2024-02-04: 60 mg via INTRAVENOUS
  Administered 2024-02-04: 40 mg via INTRAVENOUS

## 2024-02-04 MED ORDER — SODIUM CHLORIDE 0.9 % IV SOLN: INTRAVENOUS | Status: DC

## 2024-02-04 NOTE — Discharge Instructions (Addendum)

## 2024-02-04 NOTE — Interval H&P Note (Signed)
 History and Physical Interval Note:  02/04/2024 8:22 AM  Jennifer [name]  has presented today for surgery, with the diagnosis of dysphagia.  The various methods of treatment have been discussed with the patient and family. After consideration of risks, benefits and other options for treatment, the patient has consented to  Procedure(s): EGD (ESOPHAGOGASTRODUODENOSCOPY) (N/A) as a surgical intervention.  The patient's history has been reviewed, patient examined, no change in status, stable for surgery.  I have reviewed the patient's chart and labs.  Questions were answered to the patient's satisfaction.     Jerzy Crotteau

## 2024-02-04 NOTE — Op Note (Signed)
 [facility] Patient Name: Jennifer [name] Procedure Date : 02/04/2024 MRN: [id number] Attending MD: Gustav ALONSO Mcgee , MD, [id number] Date of Birth: [birth year] CSN: [id number] Age: 73 Admit Type: Outpatient Procedure:                Upper GI endoscopy Indications:              Dysphagia Providers:                Gustav ALONSO Mcgee, MD, Darleene Bare, RN, Lorrayne Kitty, Technician Referring MD:              Medicines:                Monitored Anesthesia Care Complications:            No immediate complications. Estimated Blood Loss:     Estimated blood loss was minimal. Procedure:                Pre-Anesthesia Assessment:                           - Prior to the procedure, a History and Physical                            was performed, and patient medications and                            allergies were reviewed. The patient's tolerance of                            previous anesthesia was also reviewed. The risks                            and benefits of the procedure and the sedation                            options and risks were discussed with the patient.                            All questions were answered, and informed consent                            was obtained. Prior Anticoagulants: The patient                            last took Xarelto  (rivaroxaban ) 2 days prior to the                            procedure. ASA Grade Assessment: III - A patient                            with severe systemic disease. After reviewing the  risks and benefits, the patient was deemed in                            satisfactory condition to undergo the procedure.                           After obtaining informed consent, the endoscope was                            passed under direct vision. Throughout the                            procedure, the patient's blood pressure, pulse, and                            oxygen  saturations were monitored continuously. The                            [id number]-[id number] ([id number]) Olympus endoscope was introduced                            through the mouth, and advanced to the second part                            of duodenum. The upper GI endoscopy was                            accomplished without difficulty. The patient                            tolerated the procedure well. Scope In: Scope Out: Findings:      One benign-appearing, intrinsic moderate (circumferential scarring or       stenosis; an endoscope may pass) stenosis was found 38 to 40 cm from the       incisors. The stenosis was traversed. A guidewire was placed and the       scope was withdrawn. Dilation was performed with a Savary dilator with       mild resistance at 18 mm and 19 mm and moderate resistance at 20 mm. The       dilation site was examined following endoscope reinsertion and showed       mild mucosal disruption and moderate improvement in luminal narrowing.      The exam of the esophagus was otherwise normal.      Evidence of a Nissen fundoplication was found in the cardia. The wrap       appeared tight. This was traversed.      The exam of the stomach was otherwise normal.      The examined duodenum was normal. Impression:               - Benign-appearing esophageal stenosis. Dilated.                           - A Nissen fundoplication was found. The wrap  appears tight.                           - Normal examined duodenum.                           - No specimens collected. Recommendation:           - Resume previous diet.                           - Continue present medications.                           - Await pathology results.                           - Return to GI office in 3 months.                           - Resume Xarelto  (rivaroxaban ) at prior dose                            tomorrow. Refer to managing physician for further                             adjustment of therapy. Procedure Code(s):        --- Professional ---                           [phone], Esophagogastroduodenoscopy, flexible,                            transoral; with insertion of guide wire followed by                            passage of dilator(s) through esophagus over guide                            wire Diagnosis Code(s):        --- Professional ---                           K22.2, Esophageal obstruction                           Z98.890, Other specified postprocedural states                           R13.10, Dysphagia, unspecified CPT copyright 2022 American Medical Association. All rights reserved. The codes documented in this report are preliminary and upon coder review may  be revised to meet current compliance requirements. Graiden Henes V. Jazzalyn Loewenstein, MD 02/04/2024 9:23:14 AM This report has been signed electronically. Number of Addenda: 0

## 2024-02-04 NOTE — Transfer of Care (Signed)
 Immediate Anesthesia Transfer of Care Note  Patient: Jennifer [name]  Procedure(s) Performed: EGD (ESOPHAGOGASTRODUODENOSCOPY)  Patient Location: PACU  Anesthesia Type:MAC  Level of Consciousness: drowsy  Airway & Oxygen Therapy: Patient Spontanous Breathing and Patient connected to face mask oxygen  Post-op Assessment: Report given to RN and Post -op Vital signs reviewed and stable  Post vital signs: Reviewed and stable  Last Vitals:  Vitals Value Taken Time  BP 147/82 02/04/24 09:01  Temp    Pulse 83 02/04/24 09:03  Resp 17 02/04/24 09:03  SpO2 100 % 02/04/24 09:03  Vitals shown include unfiled device data.  Last Pain:  Vitals:   02/04/24 0729  TempSrc: Temporal  PainSc: 0-No pain      Patients Stated Pain Goal: 0 (02/04/24 0729)  Complications: No notable events documented.

## 2024-02-04 NOTE — Anesthesia Preprocedure Evaluation (Addendum)
 Anesthesia Evaluation  Patient identified by MRN, date of birth, ID band Patient awake    Reviewed: Allergy & Precautions, NPO status , Patient's Chart, lab work & pertinent test results  Airway Mallampati: II  TM Distance: >3 FB Neck ROM: Full    Dental no notable dental hx.    Pulmonary sleep apnea and Continuous Positive Airway Pressure Ventilation , PE   Pulmonary exam normal        Cardiovascular hypertension, + DOE  + Valvular Problems/Murmurs MVP  Rhythm:Regular Rate:Normal     Neuro/Psych  Headaches  Anxiety        GI/Hepatic Neg liver ROS, hiatal hernia,GERD  ,,Dysphagia    Endo/Other  diabetes    Renal/GU   negative genitourinary   Musculoskeletal  (+) Arthritis ,  Fibromyalgia -  Abdominal Normal abdominal exam  (+)   Peds  Hematology  (+) Blood dyscrasia, anemia Lab Results      Component                Value               Date                      WBC                      3.7 (L)             01/30/2024                HGB                      12.5                01/30/2024                HCT                      38.9                01/30/2024                MCV                      87.6                01/30/2024                PLT                      266.0               01/30/2024             Lab Results      Component                Value               Date                      NA                       142                 01/30/2024                K  3.8                 01/30/2024                CO2                      33 (H)              01/30/2024                GLUCOSE                  89                  01/30/2024                BUN                      15                  01/30/2024                CREATININE               0.93                01/30/2024                CALCIUM                   9.5                 01/30/2024                GFR                      62.01                01/30/2024                EGFR                     67                  12/12/2022                GFRNONAA                 58 (L)              10/17/2023              Anesthesia Other Findings   Reproductive/Obstetrics                              Anesthesia Physical Anesthesia Plan  ASA: 3  Anesthesia Plan: MAC   Post-op Pain Management:    Induction: Intravenous  PONV Risk Score and Plan: 2 and Propofol  infusion and Treatment may vary due to age or medical condition  Airway Management Planned: Simple Face Mask and Nasal Cannula  Additional Equipment: None  Intra-op Plan:   Post-operative Plan:   Informed Consent: I have reviewed the patients History and Physical, chart, labs and discussed the procedure including the risks, benefits and alternatives for the proposed anesthesia with the patient or authorized representative who has indicated his/her understanding and acceptance.     Dental advisory given  Plan Discussed with: CRNA  Anesthesia Plan  Comments:          Anesthesia Quick Evaluation

## 2024-02-05 ENCOUNTER — Encounter (HOSPITAL_COMMUNITY): Payer: Self-pay | Admitting: Gastroenterology

## 2024-02-05 LAB — GLUCOSE, CAPILLARY: Glucose-Capillary: 88 mg/dL (ref 70–99)

## 2024-02-05 NOTE — Anesthesia Postprocedure Evaluation (Signed)
 Anesthesia Post Note  Patient: Jennifer [name]  Procedure(s) Performed: EGD (ESOPHAGOGASTRODUODENOSCOPY)     Patient location during evaluation: PACU Anesthesia Type: MAC Level of consciousness: awake and alert Pain management: pain level controlled Vital Signs Assessment: post-procedure vital signs reviewed and stable Respiratory status: spontaneous breathing, nonlabored ventilation, respiratory function stable and patient connected to nasal cannula oxygen Cardiovascular status: stable and blood pressure returned to baseline Postop Assessment: no apparent nausea or vomiting Anesthetic complications: no   No notable events documented.  Last Vitals:  Vitals:   02/04/24 0920 02/04/24 0927  BP: (!) 150/89 (!) 148/93  Pulse: 76 72  Resp: 17 15  Temp:    SpO2: 100% 100%    Last Pain:  Vitals:   02/04/24 0927  TempSrc:   PainSc: 0-No pain                 Cordella SQUIBB [name]

## 2024-02-07 ENCOUNTER — Ambulatory Visit (HOSPITAL_COMMUNITY)
Admission: RE | Admit: 2024-02-07 | Discharge: 2024-02-07 | Disposition: A | Discharge status: Home / Self Care | Source: Ambulatory Visit | Attending: Gastroenterology | Admitting: Gastroenterology

## 2024-02-07 DIAGNOSIS — R1319 Other dysphagia: Secondary | ICD-10-CM | POA: Diagnosis not present

## 2024-02-07 DIAGNOSIS — R1031 Right lower quadrant pain: Secondary | ICD-10-CM | POA: Diagnosis not present

## 2024-02-07 DIAGNOSIS — K219 Gastro-esophageal reflux disease without esophagitis: Secondary | ICD-10-CM | POA: Diagnosis not present

## 2024-02-07 DIAGNOSIS — K429 Umbilical hernia without obstruction or gangrene: Secondary | ICD-10-CM | POA: Diagnosis not present

## 2024-02-07 DIAGNOSIS — K625 Hemorrhage of anus and rectum: Secondary | ICD-10-CM | POA: Diagnosis not present

## 2024-02-07 DIAGNOSIS — K602 Anal fissure, unspecified: Secondary | ICD-10-CM | POA: Diagnosis not present

## 2024-02-07 DIAGNOSIS — K439 Ventral hernia without obstruction or gangrene: Secondary | ICD-10-CM | POA: Diagnosis not present

## 2024-02-07 DIAGNOSIS — K5730 Diverticulosis of large intestine without perforation or abscess without bleeding: Secondary | ICD-10-CM | POA: Diagnosis not present

## 2024-02-07 MED ORDER — IOHEXOL 300 MG/ML  SOLN
100.0000 mL | Freq: Once | INTRAMUSCULAR | Status: AC | PRN
  Administered 2024-02-07: 100 mL via INTRAVENOUS

## 2024-02-11 NOTE — Telephone Encounter (Signed)
 Inbound call from patient stating she was returning a call back to [place]. Patient is requesting a call back. Please advise.

## 2024-02-11 NOTE — Progress Notes (Signed)
 Attempted to reach patient. No answer, left VM for patient to return call.

## 2024-02-11 NOTE — Progress Notes (Signed)
 Returned call to pt. Discussed CT results. All questions answered. Pt verbalized understanding.

## 2024-02-15 ENCOUNTER — Other Ambulatory Visit: Payer: Self-pay | Admitting: Gastroenterology

## 2024-02-20 ENCOUNTER — Encounter (INDEPENDENT_AMBULATORY_CARE_PROVIDER_SITE_OTHER): Payer: Self-pay | Admitting: Internal Medicine

## 2024-02-20 ENCOUNTER — Ambulatory Visit (INDEPENDENT_AMBULATORY_CARE_PROVIDER_SITE_OTHER): Admitting: Internal Medicine

## 2024-02-20 VITALS — BP 115/70 | HR 100 | Temp 98.1°F | Ht 68.0 in | Wt 214.0 lb

## 2024-02-20 DIAGNOSIS — Z6833 Body mass index (BMI) 33.0-33.9, adult: Secondary | ICD-10-CM

## 2024-02-20 DIAGNOSIS — E66811 Obesity, class 1: Secondary | ICD-10-CM

## 2024-02-20 DIAGNOSIS — E1169 Type 2 diabetes mellitus with other specified complication: Secondary | ICD-10-CM | POA: Diagnosis not present

## 2024-02-20 DIAGNOSIS — I10 Essential (primary) hypertension: Secondary | ICD-10-CM

## 2024-02-20 DIAGNOSIS — Z7985 Long-term (current) use of injectable non-insulin antidiabetic drugs: Secondary | ICD-10-CM | POA: Diagnosis not present

## 2024-02-20 DIAGNOSIS — G4733 Obstructive sleep apnea (adult) (pediatric): Secondary | ICD-10-CM

## 2024-02-20 MED ORDER — TIRZEPATIDE 10 MG/0.5ML ~~LOC~~ SOAJ: 10.0000 mg | SUBCUTANEOUS | 1 refills | Status: DC

## 2024-02-20 NOTE — Assessment & Plan Note (Signed)
 Diabetes is in remission with an A1c of 5.6. Weight loss and lifestyle changes have contributed to improved glycemic control. - Continue current lifestyle modifications and monjauro

## 2024-02-20 NOTE — Progress Notes (Signed)
 Office: [phone]  /  Fax: [phone]  Weight Summary and Body Composition Analysis (BIA)  Vitals Temp: 98.1 F (36.7 C) BP: 115/70 Pulse Rate: 100 SpO2: 99 %   Anthropometric Measurements Height: 5' 8 (1.727 m) Weight: 214 lb (97.1 kg) BMI (Calculated): 32.55 Weight at Last Visit: 219 lb Weight Lost Since Last Visit: 5 lb Weight Gained Since Last Visit: 0 lb Starting Weight: 256 lb Total Weight Loss (lbs): 42 lb (19.1 kg) Peak Weight: 259 lb   Body Composition  Body Fat %: 44.4 % Fat Mass (lbs): 95.4 lbs Muscle Mass (lbs): 113.4 lbs Total Body Water (lbs): 79.8 lbs Visceral Fat Rating : 13    RMR: 1310  Today's Visit #: 16  Starting Date: 12/12/22   Subjective   Chief Complaint: Obesity  Interval History Discussed the use of AI scribe software for clinical note transcription with the patient, who gave verbal consent to proceed.  History of Present Illness Jennifer [name] is a 73 year old female with hypertension, sleep apnea, and type 2 diabetes who presents for medical weight management.  She has lost five pounds since her last visit, attributing this to increased physical activity, particularly after resuming sessions with her previous systems analyst. She adheres to a 1200 calorie nutrition plan 75% of the time, tracks her eating, ensures adequate protein intake, and maintains hydration. Her exercise routine includes four days a week of 30 to 60 minutes of cardio and strength training.  She underwent indirect calorimetry due to a slowing in weight loss, revealing a resting energy expenditure of 1310, which is 16% slower than her calculated BMR. Her weight has decreased from a peak of 261 pounds in December 2024 to her current weight, with the patient reporting significant weight loss. She has donated clothes and other items as a result of her weight loss.  She uses a CPAP machine for sleep apnea but sometimes removes it during the night. Her husband  notes she no longer snores as much.  She takes triamterene /hydrochlorothiazide  for hypertension, currently at half a tablet every three days, monitoring her blood pressure to ensure it remains below 130/80 mmHg. No dizziness or lightheadedness recently.  Her type 2 diabetes is in remission with an A1c of 5.6. She feels better overall, especially after resuming training with her personal trainer, Thana, who she describes as experienced and motivating.  She has a history of endoscopy with dilation for esophageal tightness and reports a small hernia, though its location is unclear. She experiences improved swallowing after the procedure.     Challenges affecting patient progress: none.    Pharmacotherapy for weight management: She is currently taking Monjauro with diabetes as the primary indication and obesity secondary with adequate clinical response  and without side effects..   Assessment and Plan   Treatment Plan For Obesity:  Recommended Dietary Goals  Dewey is currently in the action stage of change. As such, her goal is to continue weight management plan. She has agreed to: follow the Category 2 plan - 1200 kcal per day  Behavioral Health and Counseling  We discussed the following behavioral modification strategies today: continue to work on maintaining a reduced calorie state, getting the recommended amount of protein, incorporating whole foods, making healthy choices, staying well hydrated and practicing mindfulness when eating. and increase protein intake, fibrous foods (25 grams per day for women, 30 grams for men) and water to improve satiety and decrease hunger signals. .  Additional education and resources provided today:  None  Recommended Physical Activity Goals  Deundra has been advised to work up to 150 minutes of moderate intensity aerobic activity a week and strengthening exercises 2-3 times per week for cardiovascular health, weight loss maintenance and preservation of  muscle mass.  She has agreed to :  Continue current level of physical activity   Medical Interventions and Pharmacotherapy  We discussed various medication options to help Yazmeen with her weight loss efforts and we both agreed to : Adequate clinical response to anti-obesity medication, continue current regimen and do not recommend further increases in GLP-1 due to adequate clinical response   Associated Conditions Impacted by Obesity Treatment  Assessment & Plan Class 1 obesity with serious comorbidity and body mass index (BMI) of 33.0 to 33.9 in adult, unspecified obesity type Weight: decrease of 47 lb (18%) over 10 months, 3 weeks  Start: 03/30/2023 261 lb (118.4 kg)  End: 02/20/2024 214 lb (97.1 kg)   Significant weight loss of 48 pounds since December 2024, reducing BMI from nearly 40 to 32. Current weight management includes a 1200 calorie nutrition plan, regular exercise, and Mounjaro  for appetite control. Indirect calorimetry shows a resting energy expenditure of 1310, 16% slower than calculated BMR, indicating a slowing metabolism. Increased physical activity has contributed to weight loss. - Continue 1200 calorie nutrition plan. - Maintain regular exercise regimen, including cardio and strengthening exercises. - Ensure intake of 75-90 grams of protein daily. - Continue Mounjaro  for appetite control. - Provided handout on holiday weight management tips. Essential hypertension Blood pressure is well-controlled. She has been taking triamterene -hydrochlorothiazide  intermittently based on blood pressure readings. Recent weight loss and Mounjaro  may have contributed to improved blood pressure control. - Stop triamterene -hydrochlorothiazide  for one week and monitor blood pressure. - Aim for blood pressure under 130/80 mmHg. - If blood pressure remains controlled, may discontinue medication. OSA on CPAP Improvement in symptoms with weight loss. She reports reduced snoring and is using CPAP  nightly. A follow-up sleep study is recommended to assess current severity and potential need for continued CPAP use. - Discuss follow-up sleep study with sleep specialist. - Continue CPAP use nightly.  Type 2 diabetes mellitus with other specified complication, without long-term current use of insulin  (HCC) Diabetes is in remission with an A1c of 5.6. Weight loss and lifestyle changes have contributed to improved glycemic control. - Continue current lifestyle modifications and monjauro          Objective   Physical Exam:  Blood pressure 115/70, pulse 100, temperature 98.1 F (36.7 C), height 5' 8 (1.727 m), weight 214 lb (97.1 kg), SpO2 99%. Body mass index is 32.54 kg/m.  General: She is overweight, cooperative, alert, well developed, and in no acute distress. PSYCH: Has normal mood, affect and thought process.   HEENT: EOMI, sclerae are anicteric. Lungs: Normal breathing effort, no conversational dyspnea. Extremities: No edema.  Neurologic: No gross sensory or motor deficits. No tremors or fasciculations noted.    Diagnostic Data Reviewed:  BMET    Component Value Date/Time   NA 142 01/30/2024 1235   NA 143 12/12/2022 1100   NA 143 02/16/2017 1450   NA 142 01/19/2016 1141   K 3.8 01/30/2024 1235   K 3.7 02/16/2017 1450   K 3.7 01/19/2016 1141   CL 101 01/30/2024 1235   CL 105 02/16/2017 1450   CO2 33 (H) 01/30/2024 1235   CO2 32 02/16/2017 1450   CO2 27 01/19/2016 1141   GLUCOSE 89 01/30/2024 1235   GLUCOSE  88 02/16/2017 1450   BUN 15 01/30/2024 1235   BUN 17 12/12/2022 1100   BUN 14 02/16/2017 1450   BUN 19.5 01/19/2016 1141   CREATININE 0.93 01/30/2024 1235   CREATININE 1.04 (H) 10/17/2023 1442   CREATININE 1.2 02/16/2017 1450   CREATININE 0.9 01/19/2016 1141   CALCIUM  9.5 01/30/2024 1235   CALCIUM  8.8 02/16/2017 1450   CALCIUM  8.8 01/19/2016 1141   GFRNONAA 58 (L) 10/17/2023 1442   GFRAA >60 12/25/2019 1353   Lab Results  Component Value Date    HGBA1C 5.6 12/31/2023   HGBA1C 6.2 (H) 10/14/2007   Lab Results  Component Value Date   INSULIN  20.4 12/12/2022   INSULIN  12.6 05/19/2021   Lab Results  Component Value Date   TSH 1.82 07/24/2023   CBC    Component Value Date/Time   WBC 3.7 (L) 01/30/2024 1235   RBC 4.44 01/30/2024 1235   HGB 12.5 01/30/2024 1235   HGB 12.2 10/17/2023 1442   HGB 12.1 02/16/2017 1450   HCT 38.9 01/30/2024 1235   HCT 36.6 02/16/2017 1450   PLT 266.0 01/30/2024 1235   PLT 220 10/17/2023 1442   PLT 239 02/16/2017 1450   MCV 87.6 01/30/2024 1235   MCV 87 02/16/2017 1450   MCH 28.5 10/17/2023 1442   MCHC 32.2 01/30/2024 1235   RDW 16.1 (H) 01/30/2024 1235   RDW 15.4 02/16/2017 1450   Iron Studies    Component Value Date/Time   IRON 66 01/30/2024 1235   IRON 49 02/16/2017 1536   TIBC 334.6 01/30/2024 1235   TIBC 284 02/16/2017 1536   FERRITIN 51 12/05/2021 1452   FERRITIN 66 02/16/2017 1536   IRONPCTSAT 19.7 (L) 01/30/2024 1235   IRONPCTSAT 17 (L) 02/16/2017 1536   Lipid Panel     Component Value Date/Time   CHOL 115 06/08/2023 1422   TRIG 64 06/08/2023 1422   HDL 67 06/08/2023 1422   CHOLHDL 1.7 06/08/2023 1422   VLDL 9.8 06/05/2022 1320   LDLCALC 34 06/08/2023 1422   Hepatic Function Panel     Component Value Date/Time   PROT 7.3 10/17/2023 1442   PROT 7.1 12/12/2022 1100   PROT 7.3 02/16/2017 1450   PROT 7.3 01/19/2016 1141   ALBUMIN 4.4 10/17/2023 1442   ALBUMIN 4.3 12/12/2022 1100   ALBUMIN 3.6 01/19/2016 1141   AST 29 10/17/2023 1442   AST 21 01/19/2016 1141   ALT 31 10/17/2023 1442   ALT 27 02/16/2017 1450   ALT 18 01/19/2016 1141   ALKPHOS 51 10/17/2023 1442   ALKPHOS 57 02/16/2017 1450   ALKPHOS 59 01/19/2016 1141   BILITOT 0.5 10/17/2023 1442   BILITOT 0.31 01/19/2016 1141   BILIDIR 0.0 01/08/2012 1224   IBILI 0.2 12/30/2010 1515      Component Value Date/Time   TSH 1.82 07/24/2023 1436   Nutritional Lab Results  Component Value Date   VD25OH 61.5  12/12/2022   VD25OH 42.2 05/19/2021    Medications: Outpatient Encounter Medications as of 02/20/2024  Medication Sig Note   acetaminophen  (TYLENOL ) 500 MG tablet Take 1 tablet (500 mg total) by mouth every 6 (six) hours as needed.    AMBULATORY NON FORMULARY MEDICATION Medication Name: Nitroglycerin ointment 0.125% Use a pea sized amount per rectum three times a day for 6-8 weeks 02/04/2024: 02/03/2024: Has not started   Apoaequorin (PREVAGEN) 10 MG CAPS Take 10 mg by mouth daily. prevagen    cycloSPORINE  (RESTASIS ) 0.05 % ophthalmic emulsion Place 1 drop  into both eyes 2 (two) times daily.    hydrocortisone  valerate cream (WESTCORT ) 0.2 % Apply 1 Application topically daily as needed (itching).    hyoscyamine  (LEVSIN ) 0.125 MG tablet Take 1 tablet (0.125 mg total) by mouth every 4 hours as needed.    Multiple Vitamins-Minerals (MULTIVITAMIN WITH MINERALS) tablet Take 2 tablets by mouth daily. Vitafusion woman gummy    NEXIUM  40 MG capsule Take 1 capsule (40 mg total) by mouth daily before breakfast.    rosuvastatin  (CRESTOR ) 10 MG tablet Take 1 tablet (10 mg total) by mouth at bedtime.    temazepam  (RESTORIL ) 30 MG capsule TAKE ONE CAPSULE BY MOUTH AT BEDTIME AS NEEDED FOR SLEEP    tirzepatide  (MOUNJARO ) 10 MG/0.5ML Pen Inject 10 mg into the skin once a week.    triamterene -hydrochlorothiazide  (MAXZIDE -25) 37.5-25 MG tablet Take 0.5 tablets by mouth daily.    XARELTO  2.5 MG TABS tablet TAKE TWO TABLETS BY MOUTH DAILY    No facility-administered encounter medications on file as of 02/20/2024.     Follow-Up   No follow-ups on file.SABRA She was informed of the importance of frequent follow up visits to maximize her success with intensive lifestyle modifications for her multiple health conditions.  Attestation Statement   Reviewed by clinician on day of visit: allergies, medications, problem list, medical history, surgical history, family history, social history, and previous encounter  notes.     Lucas Parker, MD

## 2024-02-20 NOTE — Assessment & Plan Note (Addendum)
 Weight: decrease of 47 lb (18%) over 10 months, 3 weeks  Start: 03/30/2023 261 lb (118.4 kg)  End: 02/20/2024 214 lb (97.1 kg)   Significant weight loss of 48 pounds since December 2024, reducing BMI from nearly 40 to 32. Current weight management includes a 1200 calorie nutrition plan, regular exercise, and Mounjaro  for appetite control. Indirect calorimetry shows a resting energy expenditure of 1310, 16% slower than calculated BMR, indicating a slowing metabolism. Increased physical activity has contributed to weight loss. - Continue 1200 calorie nutrition plan. - Maintain regular exercise regimen, including cardio and strengthening exercises. - Ensure intake of 75-90 grams of protein daily. - Continue Mounjaro  for appetite control. - Provided handout on holiday weight management tips.

## 2024-02-20 NOTE — Assessment & Plan Note (Addendum)
 Blood pressure is well-controlled. She has been taking triamterene -hydrochlorothiazide  intermittently based on blood pressure readings. Recent weight loss and Mounjaro  may have contributed to improved blood pressure control. - Stop triamterene -hydrochlorothiazide  for one week and monitor blood pressure. - Aim for blood pressure under 130/80 mmHg. - If blood pressure remains controlled, may discontinue medication.

## 2024-02-20 NOTE — Assessment & Plan Note (Addendum)
 Improvement in symptoms with weight loss. She reports reduced snoring and is using CPAP nightly. A follow-up sleep study is recommended to assess current severity and potential need for continued CPAP use. - Discuss follow-up sleep study with sleep specialist. - Continue CPAP use nightly.

## 2024-02-21 ENCOUNTER — Ambulatory Visit
Admission: RE | Admit: 2024-02-21 | Discharge: 2024-02-21 | Disposition: A | Source: Ambulatory Visit | Attending: Internal Medicine | Admitting: Internal Medicine

## 2024-02-21 ENCOUNTER — Ambulatory Visit
Admission: RE | Admit: 2024-02-21 | Discharge: 2024-02-21 | Disposition: A | Discharge status: Home / Self Care | Source: Ambulatory Visit | Attending: Internal Medicine | Admitting: Internal Medicine

## 2024-02-21 DIAGNOSIS — R928 Other abnormal and inconclusive findings on diagnostic imaging of breast: Secondary | ICD-10-CM | POA: Diagnosis not present

## 2024-02-21 DIAGNOSIS — N6001 Solitary cyst of right breast: Secondary | ICD-10-CM | POA: Diagnosis not present

## 2024-02-25 ENCOUNTER — Ambulatory Visit (INDEPENDENT_AMBULATORY_CARE_PROVIDER_SITE_OTHER): Admitting: Internal Medicine

## 2024-03-04 ENCOUNTER — Other Ambulatory Visit: Payer: Self-pay | Admitting: Hematology & Oncology

## 2024-03-04 ENCOUNTER — Other Ambulatory Visit: Payer: Self-pay | Admitting: Internal Medicine

## 2024-03-05 ENCOUNTER — Encounter: Payer: Self-pay | Admitting: Family

## 2024-03-13 ENCOUNTER — Other Ambulatory Visit: Payer: Self-pay | Admitting: Gastroenterology

## 2024-03-24 ENCOUNTER — Ambulatory Visit (INDEPENDENT_AMBULATORY_CARE_PROVIDER_SITE_OTHER): Payer: Self-pay | Admitting: Internal Medicine

## 2024-03-24 ENCOUNTER — Encounter (INDEPENDENT_AMBULATORY_CARE_PROVIDER_SITE_OTHER): Payer: Self-pay | Admitting: Internal Medicine

## 2024-03-24 VITALS — BP 139/84 | HR 81 | Temp 97.9°F | Ht 68.0 in | Wt 211.0 lb

## 2024-03-24 DIAGNOSIS — Z6832 Body mass index (BMI) 32.0-32.9, adult: Secondary | ICD-10-CM | POA: Diagnosis not present

## 2024-03-24 DIAGNOSIS — Z7985 Long-term (current) use of injectable non-insulin antidiabetic drugs: Secondary | ICD-10-CM | POA: Diagnosis not present

## 2024-03-24 DIAGNOSIS — E66811 Obesity, class 1: Secondary | ICD-10-CM

## 2024-03-24 DIAGNOSIS — E1169 Type 2 diabetes mellitus with other specified complication: Secondary | ICD-10-CM | POA: Diagnosis not present

## 2024-03-24 DIAGNOSIS — G4733 Obstructive sleep apnea (adult) (pediatric): Secondary | ICD-10-CM | POA: Diagnosis not present

## 2024-03-24 MED ORDER — TIRZEPATIDE 10 MG/0.5ML ~~LOC~~ SOAJ: 10.0000 mg | SUBCUTANEOUS | 1 refills | Status: DC

## 2024-03-24 NOTE — Progress Notes (Unsigned)
 Office: [phone]  /  Fax: [phone]  Weight Summary and Body Composition Analysis (BIA)  Vitals Temp: 97.9 F (36.6 C) BP: 139/84 Pulse Rate: 81 SpO2: 98 %   Anthropometric Measurements Height: 5' 8 (1.727 m) Weight: 211 lb (95.7 kg) BMI (Calculated): 32.09 Weight at Last Visit: 214 lb Weight Lost Since Last Visit: 3 lb Weight Gained Since Last Visit: 0 lb Starting Weight: 256 lb Total Weight Loss (lbs): 45 lb (20.4 kg) Peak Weight: 259 lb   Body Composition  Body Fat %: 43.9 % Fat Mass (lbs): 92.8 lbs Muscle Mass (lbs): 112.8 lbs Total Body Water (lbs): 79.2 lbs Visceral Fat Rating : 13    RMR: 1310  Today's Visit #: 17  Starting Date: 12/12/22   Subjective   Chief Complaint: Obesity  Interval History Discussed the use of AI scribe software for clinical note transcription with the patient, who gave verbal consent to proceed.  History of Present Illness Jennifer [name] is a 73 year old female who presents for medical weight management.  She has been actively engaged in a medical weight management program and has lost three pounds since her last visit, totaling 45 pounds. She follows a 1200 calorie nutrition plan approximately 60% of the time and exercises two days a week for 30 minutes each session. She is one pound away from her weight on her wedding day 26 years ago.  She has been on Mounjaro  for over a year, starting in November 2024, with no significant side effects except for some stomach issues, which she manages with a tablespoon of Benefiber and a cap of a medication with a purple lid. No recent constipation or diarrhea. She continues to crush her pills and takes mini vitamins and B12 supplements. No sensation of food getting stuck after a recent esophageal stretching procedure.  Her diet includes yogurt, boiled eggs, bread, and sometimes cheese. She drinks hot lemon water before meals and occasionally substitutes meals with protein drinks. She  switched from Aciphex  to Nexium  due to insurance issues, with no recent stomach aches or irritable bowel syndrome attacks.  She is actively involved in candy making, particularly during the holiday season, but avoids consuming her products. She uses high-quality ingredients and maintains dietary discipline despite being surrounded by sweets. She engages in physical activity, including sessions with a systems analyst.  She reports a significant reduction in appetite since starting Mounjaro , aiding in her weight loss maintenance. She is mindful of her nutrition, focusing on fiber and protein intake, and remains committed to her weight management goals.     Challenges affecting patient progress: Difficulties with swallowing.    Pharmacotherapy for weight management: She is currently taking Monjauro with diabetes as the primary indication and obesity secondary with adequate clinical response  and without side effects..   Assessment and Plan   Treatment Plan For Obesity:  Recommended Dietary Goals  Jennifer [name] is currently in the action stage of change. As such, her goal is to continue weight management plan. She has agreed to: continue current plan  Behavioral Health and Counseling  We discussed the following behavioral modification strategies today: continue to work on maintaining a reduced calorie state, getting the recommended amount of protein, incorporating whole foods, making healthy choices, staying well hydrated and practicing mindfulness when eating. and increase protein intake, fibrous foods (25 grams per day for women, 30 grams for men) and water to improve satiety and decrease hunger signals. .  Additional education and resources provided today: Handout on  traveling and holiday eating strategies  Recommended Physical Activity Goals  Jennifer [name] has been advised to work up to 150 minutes of moderate intensity aerobic activity a week and strengthening exercises 2-3 times per week for  cardiovascular health, weight loss maintenance and preservation of muscle mass.  She has agreed to :  Think about enjoyable ways to increase daily physical activity and overcoming barriers to exercise, Increase physical activity in their day and reduce sedentary time (increase NEAT)., Increase volume of physical activity to a goal of 240 minutes a week, and Combine aerobic and strengthening exercises for efficiency and improved cardiometabolic health.  Medical Interventions and Pharmacotherapy  We discussed various medication options to help Jennifer [name] with her weight loss efforts and we both agreed to : Adequate clinical response to anti-obesity medication, continue current regimen  Associated Conditions Impacted by Obesity Treatment  Assessment & Plan Class 1 obesity with serious comorbidity and body mass index (BMI) of 33.0 to 33.9 in adult, unspecified obesity type Weight: decrease of 47 lb (18%) over 10 months, 4 weeks  Start: 03/30/2023 261 lb (118.4 kg)  End: 02/20/2024 214 lb (97.1 kg)   BMI of 32. Significant weight loss of 53 pounds (20% of initial body weight) over the past year, attributed to Mounjaro , dietary modifications, and regular physical activity. No significant side effects from Mounjaro , except for previous gastrointestinal issues managed with fiber supplements and Nexium . Appetite is well-controlled, and she maintains muscle mass without malnutrition. Prefers to maintain current Mounjaro  dose to avoid potential side effects such as constipation. - Continue Mounjaro  at current dose. - Refilled Mounjaro  prescription at [facility] pharmacy. - Maintain current dietary plan with focus on high fiber and protein intake. - Continue regular physical activity, including exercise twice a week. - Will schedule follow-up appointment in January. OSA on CPAP Improvement in symptoms with weight loss. She reports reduced snoring and is using CPAP nightly. A follow-up sleep study is recommended to  assess current severity and potential need for continued CPAP use. - Discuss follow-up sleep study with sleep specialist. - Continue CPAP use nightly.  Type 2 diabetes mellitus with other specified complication, without long-term current use of insulin  (HCC) Diabetes is in remission with an A1c of 5.6. Weight loss and lifestyle changes have contributed to improved glycemic control. - Continue current lifestyle modifications and monjauro          Objective   Physical Exam:  Blood pressure 139/84, pulse 81, temperature 97.9 F (36.6 C), height 5' 8 (1.727 m), weight 211 lb (95.7 kg), SpO2 98%. Body mass index is 32.08 kg/m.  General: She is overweight, cooperative, alert, well developed, and in no acute distress. PSYCH: Has normal mood, affect and thought process.   HEENT: EOMI, sclerae are anicteric. Lungs: Normal breathing effort, no conversational dyspnea. Extremities: No edema.  Neurologic: No gross sensory or motor deficits. No tremors or fasciculations noted.    Diagnostic Data Reviewed:  BMET    Component Value Date/Time   NA 142 01/30/2024 1235   NA 143 12/12/2022 1100   NA 143 02/16/2017 1450   NA 142 01/19/2016 1141   K 3.8 01/30/2024 1235   K 3.7 02/16/2017 1450   K 3.7 01/19/2016 1141   CL 101 01/30/2024 1235   CL 105 02/16/2017 1450   CO2 33 (H) 01/30/2024 1235   CO2 32 02/16/2017 1450   CO2 27 01/19/2016 1141   GLUCOSE 89 01/30/2024 1235   GLUCOSE 88 02/16/2017 1450   BUN 15 01/30/2024 1235  BUN 17 12/12/2022 1100   BUN 14 02/16/2017 1450   BUN 19.5 01/19/2016 1141   CREATININE 0.93 01/30/2024 1235   CREATININE 1.04 (H) 10/17/2023 1442   CREATININE 1.2 02/16/2017 1450   CREATININE 0.9 01/19/2016 1141   CALCIUM  9.5 01/30/2024 1235   CALCIUM  8.8 02/16/2017 1450   CALCIUM  8.8 01/19/2016 1141   GFRNONAA 58 (L) 10/17/2023 1442   GFRAA >60 12/25/2019 1353   Lab Results  Component Value Date   HGBA1C 5.6 12/31/2023   HGBA1C 6.2 (H) 10/14/2007    Lab Results  Component Value Date   INSULIN  20.4 12/12/2022   INSULIN  12.6 05/19/2021   Lab Results  Component Value Date   TSH 1.82 07/24/2023   CBC    Component Value Date/Time   WBC 3.7 (L) 01/30/2024 1235   RBC 4.44 01/30/2024 1235   HGB 12.5 01/30/2024 1235   HGB 12.2 10/17/2023 1442   HGB 12.1 02/16/2017 1450   HCT 38.9 01/30/2024 1235   HCT 36.6 02/16/2017 1450   PLT 266.0 01/30/2024 1235   PLT 220 10/17/2023 1442   PLT 239 02/16/2017 1450   MCV 87.6 01/30/2024 1235   MCV 87 02/16/2017 1450   MCH 28.5 10/17/2023 1442   MCHC 32.2 01/30/2024 1235   RDW 16.1 (H) 01/30/2024 1235   RDW 15.4 02/16/2017 1450   Iron Studies    Component Value Date/Time   IRON 66 01/30/2024 1235   IRON 49 02/16/2017 1536   TIBC 334.6 01/30/2024 1235   TIBC 284 02/16/2017 1536   FERRITIN 51 12/05/2021 1452   FERRITIN 66 02/16/2017 1536   IRONPCTSAT 19.7 (L) 01/30/2024 1235   IRONPCTSAT 17 (L) 02/16/2017 1536   Lipid Panel     Component Value Date/Time   CHOL 115 06/08/2023 1422   TRIG 64 06/08/2023 1422   HDL 67 06/08/2023 1422   CHOLHDL 1.7 06/08/2023 1422   VLDL 9.8 06/05/2022 1320   LDLCALC 34 06/08/2023 1422   Hepatic Function Panel     Component Value Date/Time   PROT 7.3 10/17/2023 1442   PROT 7.1 12/12/2022 1100   PROT 7.3 02/16/2017 1450   PROT 7.3 01/19/2016 1141   ALBUMIN 4.4 10/17/2023 1442   ALBUMIN 4.3 12/12/2022 1100   ALBUMIN 3.6 01/19/2016 1141   AST 29 10/17/2023 1442   AST 21 01/19/2016 1141   ALT 31 10/17/2023 1442   ALT 27 02/16/2017 1450   ALT 18 01/19/2016 1141   ALKPHOS 51 10/17/2023 1442   ALKPHOS 57 02/16/2017 1450   ALKPHOS 59 01/19/2016 1141   BILITOT 0.5 10/17/2023 1442   BILITOT 0.31 01/19/2016 1141   BILIDIR 0.0 01/08/2012 1224   IBILI 0.2 12/30/2010 1515      Component Value Date/Time   TSH 1.82 07/24/2023 1436   Nutritional Lab Results  Component Value Date   VD25OH 61.5 12/12/2022   VD25OH 42.2 05/19/2021     Medications: Outpatient Encounter Medications as of 03/24/2024  Medication Sig Note   acetaminophen  (TYLENOL ) 500 MG tablet Take 1 tablet (500 mg total) by mouth every 6 (six) hours as needed.    AMBULATORY NON FORMULARY MEDICATION Medication Name: Nitroglycerin ointment 0.125% Use a pea sized amount per rectum three times a day for 6-8 weeks 02/04/2024: 02/03/2024: Has not started   Apoaequorin (PREVAGEN) 10 MG CAPS Take 10 mg by mouth daily. prevagen    cycloSPORINE  (RESTASIS ) 0.05 % ophthalmic emulsion Place 1 drop into both eyes 2 (two) times daily.    hydrocortisone   valerate cream (WESTCORT ) 0.2 % Apply 1 Application topically daily as needed (itching).    hyoscyamine  (LEVSIN ) 0.125 MG tablet Take 1 tablet (0.125 mg total) by mouth every 4 hours as needed.    Multiple Vitamins-Minerals (MULTIVITAMIN WITH MINERALS) tablet Take 2 tablets by mouth daily. Vitafusion woman gummy    NEXIUM  40 MG capsule Take 1 capsule (40 mg total) by mouth daily before breakfast.    rosuvastatin  (CRESTOR ) 10 MG tablet Take 1 tablet (10 mg total) by mouth at bedtime.    temazepam  (RESTORIL ) 30 MG capsule TAKE ONE CAPSULE BY MOUTH AT BEDTIME AS NEEDED FOR SLEEP    triamterene -hydrochlorothiazide  (MAXZIDE -25) 37.5-25 MG tablet Take 0.5 tablets by mouth daily.    XARELTO  2.5 MG TABS tablet TAKE TWO TABLETS BY MOUTH DAILY    [DISCONTINUED] tirzepatide  (MOUNJARO ) 10 MG/0.5ML Pen Inject 10 mg into the skin once a week.    tirzepatide  (MOUNJARO ) 10 MG/0.5ML Pen Inject 10 mg into the skin once a week.    No facility-administered encounter medications on file as of 03/24/2024.     Follow-Up   Return in about 5 weeks (around 04/28/2024) for For Weight Mangement with Dr. Francyne.SABRA She was informed of the importance of frequent follow up visits to maximize her success with intensive lifestyle modifications for her multiple health conditions.  Attestation Statement   Reviewed by clinician on day of visit: allergies,  medications, problem list, medical history, surgical history, family history, social history, and previous encounter notes.     Lucas Francyne, MD

## 2024-03-25 NOTE — Assessment & Plan Note (Signed)
 Improvement in symptoms with weight loss. She reports reduced snoring and is using CPAP nightly. A follow-up sleep study is recommended to assess current severity and potential need for continued CPAP use. - Discuss follow-up sleep study with sleep specialist. - Continue CPAP use nightly.

## 2024-03-25 NOTE — Assessment & Plan Note (Signed)
 Weight: decrease of 47 lb (18%) over 10 months, 4 weeks  Start: 03/30/2023 261 lb (118.4 kg)  End: 02/20/2024 214 lb (97.1 kg)   BMI of 32. Significant weight loss of 53 pounds (20% of initial body weight) over the past year, attributed to Mounjaro , dietary modifications, and regular physical activity. No significant side effects from Mounjaro , except for previous gastrointestinal issues managed with fiber supplements and Nexium . Appetite is well-controlled, and she maintains muscle mass without malnutrition. Prefers to maintain current Mounjaro  dose to avoid potential side effects such as constipation. - Continue Mounjaro  at current dose. - Refilled Mounjaro  prescription at [facility] pharmacy. - Maintain current dietary plan with focus on high fiber and protein intake. - Continue regular physical activity, including exercise twice a week. - Will schedule follow-up appointment in January.

## 2024-03-25 NOTE — Assessment & Plan Note (Signed)
 Diabetes is in remission with an A1c of 5.6. Weight loss and lifestyle changes have contributed to improved glycemic control. - Continue current lifestyle modifications and monjauro

## 2024-03-26 ENCOUNTER — Other Ambulatory Visit: Payer: Self-pay | Admitting: Hematology & Oncology

## 2024-03-27 ENCOUNTER — Encounter: Payer: Self-pay | Admitting: Family

## 2024-04-06 ENCOUNTER — Other Ambulatory Visit: Payer: Self-pay | Admitting: Gastroenterology

## 2024-04-08 ENCOUNTER — Telehealth: Payer: Self-pay | Admitting: Gastroenterology

## 2024-04-08 MED ORDER — NEXIUM 40 MG PO CPDR: 40.0000 mg | DELAYED_RELEASE_CAPSULE | Freq: Every day | ORAL | 2 refills | Status: AC

## 2024-04-08 NOTE — Telephone Encounter (Signed)
 Inbound call from patient stating she only has 3 Nexium  pills left and pharmacy stated they needed approval from our office to refill patients medication. Patient is scheduled for a follow up on 06/04/23. Please advise  Thank you

## 2024-04-08 NOTE — Telephone Encounter (Signed)
 I left Jennifer [name] a detailed voice mail message that her Nexium  has been sent in to [facility].

## 2024-04-09 ENCOUNTER — Other Ambulatory Visit: Payer: Self-pay

## 2024-04-09 ENCOUNTER — Encounter: Payer: Self-pay | Admitting: Medical Oncology

## 2024-04-09 ENCOUNTER — Ambulatory Visit: Admitting: Medical Oncology

## 2024-04-09 ENCOUNTER — Inpatient Hospital Stay: Attending: Hematology & Oncology

## 2024-04-09 ENCOUNTER — Other Ambulatory Visit: Payer: Self-pay | Admitting: Medical Oncology

## 2024-04-09 VITALS — BP 125/78 | HR 68 | Temp 97.7°F | Resp 18 | Ht 68.0 in | Wt 213.0 lb

## 2024-04-09 DIAGNOSIS — Z7901 Long term (current) use of anticoagulants: Secondary | ICD-10-CM

## 2024-04-09 DIAGNOSIS — Z86711 Personal history of pulmonary embolism: Secondary | ICD-10-CM | POA: Diagnosis present

## 2024-04-09 DIAGNOSIS — Z86718 Personal history of other venous thrombosis and embolism: Secondary | ICD-10-CM

## 2024-04-09 DIAGNOSIS — Z5181 Encounter for therapeutic drug level monitoring: Secondary | ICD-10-CM | POA: Diagnosis not present

## 2024-04-09 LAB — HEPATIC FUNCTION PANEL
ALT: 58 U/L — ABNORMAL HIGH (ref 0–44)
AST: 58 U/L — ABNORMAL HIGH (ref 15–41)
Albumin: 4.3 g/dL (ref 3.5–5.0)
Alkaline Phosphatase: 56 U/L (ref 38–126)
Bilirubin, Direct: 0.2 mg/dL (ref 0.0–0.2)
Indirect Bilirubin: 0.2 mg/dL — ABNORMAL LOW (ref 0.3–0.9)
Total Bilirubin: 0.4 mg/dL (ref 0.0–1.2)
Total Protein: 7.5 g/dL (ref 6.5–8.1)

## 2024-04-09 LAB — HEMOGLOBIN (CANCER CENTER ONLY): Hemoglobin: 12.5 g/dL (ref 12.0–15.0)

## 2024-04-09 NOTE — Progress Notes (Signed)
 Hematology and Oncology Follow Up Visit  Jennifer [name] [id number] 17-May-1950 73 y.o. 04/09/2024   Principle Diagnosis:  Recurrent pulmonary embolism   Current Therapy:        Xarelto  5 mg po q day - lifelong              Interim History:  Jennifer [name] is here today for follow-up.   She states that she is doing well. No concerns. She voices that she is unhappy about not seeing her doctor today She is doing well on maintenance Xarelto .  No blood loss, abnormal bruising or petechiae.  She does report daily rectal bleeding with Bms. She is seen by GI for this.  She has esophageal strictures and feels that she may have to go back for dilation soon. She has this done about every 6 months with GI.  No fever, chills, n/v, cough, rash, dizziness, SOB, chest pain, palpitations, abdominal pain or changes in bowel or bladder habits.  No swelling, tenderness, numbness or tingling in her extremities at this time.  No falls or syncope.  Appetite and hydration are good.  ECOG Performance Status: 0 - Asymptomatic Wt Readings from Last 3 Encounters:  04/09/24 213 lb (96.6 kg)  03/24/24 211 lb (95.7 kg)  02/20/24 214 lb (97.1 kg)     Medications:  Allergies as of 04/09/2024       Reactions   Doxepin  Nausea And Vomiting, Other (See Comments)   Feeling loopy   Morphine And Codeine Anaphylaxis, Shortness Of Breath   Sulfonamide Derivatives Hives   Burn from inside out   Promethazine Hcl Other (See Comments)   Hallucinations        Medication List        Accurate as of April 09, 2024  3:27 PM. If you have any questions, ask your nurse or doctor.          acetaminophen  500 MG tablet Commonly known as: TYLENOL  Take 1 tablet (500 mg total) by mouth every 6 (six) hours as needed.   AMBULATORY NON FORMULARY MEDICATION Medication Name: Nitroglycerin ointment 0.125% Use a pea sized amount per rectum three times a day for 6-8 weeks   hydrocortisone  valerate cream 0.2 % Commonly  known as: WESTCORT  Apply 1 Application topically daily as needed (itching).   hyoscyamine  0.125 MG tablet Commonly known as: LEVSIN  Take 1 tablet (0.125 mg total) by mouth every 4 hours as needed.   multivitamin with minerals tablet Take 2 tablets by mouth daily. Vitafusion woman gummy   NexIUM  40 MG capsule Generic drug: esomeprazole  Take 1 capsule (40 mg total) by mouth daily before breakfast.   Prevagen 10 MG Caps Generic drug: Apoaequorin Take 10 mg by mouth daily. prevagen   Restasis  0.05 % ophthalmic emulsion Generic drug: cycloSPORINE  Place 1 drop into both eyes 2 (two) times daily.   rosuvastatin  10 MG tablet Commonly known as: Crestor  Take 1 tablet (10 mg total) by mouth at bedtime.   temazepam  30 MG capsule Commonly known as: RESTORIL  TAKE ONE CAPSULE BY MOUTH AT BEDTIME AS NEEDED FOR SLEEP   tirzepatide  10 MG/0.5ML Pen Commonly known as: MOUNJARO  Inject 10 mg into the skin once a week.   triamterene -hydrochlorothiazide  37.5-25 MG tablet Commonly known as: MAXZIDE -25 Take 0.5 tablets by mouth daily.   Xarelto  2.5 MG Tabs tablet Generic drug: rivaroxaban  TAKE TWO TABLETS BY MOUTH DAILY        Allergies:  Allergies  Allergen Reactions   Doxepin  Nausea And Vomiting and Other (  See Comments)    Feeling loopy   Morphine And Codeine Anaphylaxis and Shortness Of Breath   Sulfonamide Derivatives Hives    Burn from inside out   Promethazine Hcl Other (See Comments)    Hallucinations    Past Medical History, Surgical history, Social history, and Family History were reviewed and updated.  Review of Systems: All other 10 point review of systems is negative.   Physical Exam:  height is 5' 8 (1.727 m) and weight is 213 lb (96.6 kg). Her oral temperature is 97.7 F (36.5 C). Her blood pressure is 125/78 and her pulse is 68. Her respiration is 18 and oxygen saturation is 97%.   Wt Readings from Last 3 Encounters:  04/09/24 213 lb (96.6 kg)  03/24/24  211 lb (95.7 kg)  02/20/24 214 lb (97.1 kg)    Ocular: Sclerae unicteric, pupils equal, round and reactive to light Ear-nose-throat: Oropharynx clear, dentition fair Lymphatic: No cervical or supraclavicular adenopathy Lungs no rales or rhonchi, good excursion bilaterally Heart regular rate and rhythm, no murmur appreciated Abd soft, nontender, positive bowel sounds MSK no focal spinal tenderness, no joint edema Neuro: non-focal, well-oriented, appropriate affect  Lab Results  Component Value Date   WBC 3.7 (L) 01/30/2024   HGB 12.5 04/09/2024   HCT 38.9 01/30/2024   MCV 87.6 01/30/2024   PLT 266.0 01/30/2024   Lab Results  Component Value Date   FERRITIN 51 12/05/2021   IRON 66 01/30/2024   TIBC 334.6 01/30/2024   UIBC 264 12/05/2021   IRONPCTSAT 19.7 (L) 01/30/2024   Lab Results  Component Value Date   RETICCTPCT 1.5 12/05/2021   RBC 4.44 01/30/2024   No results found for: KPAFRELGTCHN, LAMBDASER, KAPLAMBRATIO No results found for: KIMBERLY LE, IGMSERUM No results found for: STEPHANY CARLOTA BENSON MARKEL EARLA JOANNIE DOC VICK, SPEI   Chemistry      Component Value Date/Time   NA 142 01/30/2024 1235   NA 143 12/12/2022 1100   NA 143 02/16/2017 1450   NA 142 01/19/2016 1141   K 3.8 01/30/2024 1235   K 3.7 02/16/2017 1450   K 3.7 01/19/2016 1141   CL 101 01/30/2024 1235   CL 105 02/16/2017 1450   CO2 33 (H) 01/30/2024 1235   CO2 32 02/16/2017 1450   CO2 27 01/19/2016 1141   BUN 15 01/30/2024 1235   BUN 17 12/12/2022 1100   BUN 14 02/16/2017 1450   BUN 19.5 01/19/2016 1141   CREATININE 0.93 01/30/2024 1235   CREATININE 1.04 (H) 10/17/2023 1442   CREATININE 1.2 02/16/2017 1450   CREATININE 0.9 01/19/2016 1141      Component Value Date/Time   CALCIUM  9.5 01/30/2024 1235   CALCIUM  8.8 02/16/2017 1450   CALCIUM  8.8 01/19/2016 1141   ALKPHOS 51 10/17/2023 1442   ALKPHOS 57 02/16/2017 1450   ALKPHOS 59  01/19/2016 1141   AST 29 10/17/2023 1442   AST 21 01/19/2016 1141   ALT 31 10/17/2023 1442   ALT 27 02/16/2017 1450   ALT 18 01/19/2016 1141   BILITOT 0.5 10/17/2023 1442   BILITOT 0.31 01/19/2016 1141     Encounter Diagnoses  Name Primary?   Anticoagulation management encounter Yes   Personal history of venous thrombosis and embolism    Impression and Plan: Ms. Koffler is a very pleasant 73 yo African American female with history of recurrent PE. Her initial thrombus was diagnosed in 2012 and then recurred in 2015.  She is on lifelong anticoagulation with  Xarelto  5 mg PO daily and tolerating well.  No bleeding or issues with her medications.  No changes to dosing or plan changes at this time although I did discuss that we could move her apts out to every 12 months as it has been 10 years since her last clotting events and she has been stable on Xarelto  for quite some time. She declines as this was not offered by Dr. Timmy.  Hgb 12.5 LFTs pending  RTC 6 months Franchot or Ennever only please, labs (CBC, CMP)  Lauraine CHRISTELLA Dais, PA-C 12/17/20253:27 PM

## 2024-04-10 ENCOUNTER — Ambulatory Visit: Payer: Self-pay | Admitting: Medical Oncology

## 2024-04-18 ENCOUNTER — Ambulatory Visit: Admitting: Internal Medicine

## 2024-04-18 VITALS — BP 136/84 | HR 77 | Temp 97.6°F | Ht 68.0 in | Wt 213.6 lb

## 2024-04-18 DIAGNOSIS — E1140 Type 2 diabetes mellitus with diabetic neuropathy, unspecified: Secondary | ICD-10-CM | POA: Diagnosis not present

## 2024-04-18 DIAGNOSIS — R7989 Other specified abnormal findings of blood chemistry: Secondary | ICD-10-CM | POA: Diagnosis not present

## 2024-04-18 DIAGNOSIS — Z7985 Long-term (current) use of injectable non-insulin antidiabetic drugs: Secondary | ICD-10-CM | POA: Diagnosis not present

## 2024-04-18 DIAGNOSIS — E1169 Type 2 diabetes mellitus with other specified complication: Secondary | ICD-10-CM

## 2024-04-18 DIAGNOSIS — G4733 Obstructive sleep apnea (adult) (pediatric): Secondary | ICD-10-CM | POA: Diagnosis not present

## 2024-04-18 DIAGNOSIS — Z0001 Encounter for general adult medical examination with abnormal findings: Secondary | ICD-10-CM

## 2024-04-18 DIAGNOSIS — Z0000 Encounter for general adult medical examination without abnormal findings: Secondary | ICD-10-CM

## 2024-04-18 DIAGNOSIS — Z79899 Other long term (current) drug therapy: Secondary | ICD-10-CM

## 2024-04-18 DIAGNOSIS — G4700 Insomnia, unspecified: Secondary | ICD-10-CM

## 2024-04-18 NOTE — Progress Notes (Signed)
 "  Subjective:    Patient ID: Jennifer [name], female    DOB: August 04, 1950, 73 y.o.   MRN: [id number]  DOS:  04/18/2024 CPX  Discussed the use of AI scribe software for clinical note transcription with the patient, who gave verbal consent to proceed.  History of Present Illness Jennifer [name] is a 73 year old female who presents for an annual physical exam.  Hypertension - Home blood pressure readings are usually under 130/85 mmHg - Occasional higher values during exertion  Type 2 diabetes mellitus - Blood glucose levels range from 90 to 125 mg/dL while on Mounjaro   Obstructive sleep apnea - Uses CPAP regularly  Headache - Headaches over the past week - Uses approximately two Tylenol  tablets per day - No associated fever, chest pain, cough, or shortness of breath  Hemorrhoids - Internal hemorrhoids treated with topical cream - No recent blood in stool or urine  Hyperlipidemia - Takes rosuvastatin  10 mg daily  History of pulmonary embolism - Takes Xarelto   Lifestyle - Exercises at the gym two to three times per week - Watches diet - No alcohol or tobacco use    Review of Systems  Other than above, a 14 point review of systems is negative     Past Medical History:  Diagnosis Date   Allergy    Anemia    Arthritis    Cataract    bil cataracts removed   Chronic chest pain    Chronic fatigue 02/03/2017   Clotting disorder    Pulmonary Embolis 2010 & 2015   Colon polyps    inflamed partially ulcerated hyperplastic polyp   Diabetes (HCC) 09/24/2013   no meds   Diverticulosis    Dysphagia esophageal phase - chronic after 3 fundoplications 01/20/2014   Edema of both lower extremities    Esophageal stenosis    Stricture after fundoplication REDO   Fibromyalgia    GERD (gastroesophageal reflux disease)    Glaucoma suspect    Heart murmur    no problems    Hemorrhoids, internal, with bleeding 06/27/2017   History of hiatal hernia    Hypertension    IBS  (irritable bowel syndrome)    Lactose intolerance    Mitral valve prolapse    OSA (obstructive sleep apnea)    on CPAP-Mild - Sleep study 2004   Pre-diabetes    Pulmonary embolus (HCC)    2010 and 12-2013   Pulmonary nodule 12/13/2022   seen on CT chest xray on 12/16/22 - left upper lobe   Sleep apnea    wears CPAP   Thyroid  nodule      dr. watching    Tinnitus    Subjective   Zoster 2014   R flank    Past Surgical History:  Procedure Laterality Date   ABDOMINAL HYSTERECTOMY     still has 1 ovary    APPENDECTOMY     BRAIN SURGERY  1995   Stem surgery, Arnold Chiari Malformation   CATARACT EXTRACTION Bilateral 2017   cataracts, Dr. Cleatus   CHOLECYSTECTOMY     COLONOSCOPY     ESOPHAGEAL DILATION N/A 02/06/2023   Procedure: ESOPHAGEAL DILATION;  Surgeon: Shyrl Linnie KIDD, MD;  Location: [facility] OR;  Service: Thoracic;  Laterality: N/A;   ESOPHAGEAL DILATION N/A 02/13/2023   Procedure: ESOPHAGEAL DILATION;  Surgeon: Shyrl Linnie KIDD, MD;  Location: [facility] OR;  Service: Thoracic;  Laterality: N/A;   ESOPHAGEAL DILATION N/A 03/12/2023   Procedure: ESOPHAGEAL DILATION;  Surgeon: Shyrl,  Linnie KIDD, MD;  Location: [facility] OR;  Service: Thoracic;  Laterality: N/A;   ESOPHAGEAL MANOMETRY N/A 08/06/2019   Procedure: ESOPHAGEAL MANOMETRY (EM);  Surgeon: Avram Lupita BRAVO, MD;  Location: [facility] ENDOSCOPY;  Service: Endoscopy;  Laterality: N/A;   ESOPHAGOGASTRODUODENOSCOPY N/A 02/06/2023   Procedure: ESOPHAGOGASTRODUODENOSCOPY (EGD);  Surgeon: Shyrl Linnie KIDD, MD;  Location: [facility] OR;  Service: Thoracic;  Laterality: N/A;  With Savary dilation   ESOPHAGOGASTRODUODENOSCOPY N/A 02/13/2023   Procedure: ESOPHAGOGASTRODUODENOSCOPY (EGD);  Surgeon: Shyrl Linnie KIDD, MD;  Location: [facility] OR;  Service: Thoracic;  Laterality: N/A;   ESOPHAGOGASTRODUODENOSCOPY N/A 03/12/2023   Procedure: ESOPHAGOGASTRODUODENOSCOPY (EGD);  Surgeon: Shyrl Linnie KIDD, MD;  Location: [facility] OR;  Service: Thoracic;   Laterality: N/A;   ESOPHAGOGASTRODUODENOSCOPY N/A 02/04/2024   Procedure: EGD (ESOPHAGOGASTRODUODENOSCOPY);  Surgeon: Nandigam, Kavitha V, MD;  Location: [facility] ENDOSCOPY;  Service: Gastroenterology;  Laterality: N/A;   HEMORRHOID BANDING     HERNIA REPAIR     Hital Hernia   JOINT REPLACEMENT Right    knee   KNEE ARTHROSCOPY  05/16/2012   Procedure: ARTHROSCOPY KNEE;  Surgeon: Rome JULIANNA Pepper, MD;  Location: [facility] OR;  Service: Orthopedics;  Laterality: Left;   KNEE ARTHROSCOPY Right 01/2018   NISSEN FUNDOPLICATION     X 3 lab, open x 2 last with mesh   POLYPECTOMY     REDUCTION MAMMAPLASTY Bilateral 2007   TOTAL KNEE ARTHROPLASTY Right 08/25/2019   Procedure: RIGHT TOTAL KNEE ARTHROPLASTY;  Surgeon: Melodi Lerner, MD;  Location: [facility] ORS;  Service: Orthopedics;  Laterality: Right;    UPPER GASTROINTESTINAL ENDOSCOPY      Current Outpatient Medications  Medication Instructions   acetaminophen  (TYLENOL ) 500 mg, Oral, Every 6 hours PRN   AMBULATORY NON FORMULARY MEDICATION Medication Name: Nitroglycerin ointment 0.125% Use a pea sized amount per rectum three times a day for 6-8 weeks   cycloSPORINE  (RESTASIS ) 0.05 % ophthalmic emulsion 1 drop, 2 times daily   hydrocortisone  valerate cream (WESTCORT ) 0.2 % 1 Application, Daily PRN   hyoscyamine  (LEVSIN ) 0.125 MG tablet Take 1 tablet (0.125 mg total) by mouth every 4 hours as needed.   Multiple Vitamins-Minerals (MULTIVITAMIN WITH MINERALS) tablet 2 tablets, Daily   NexIUM  40 mg, Oral, Daily before breakfast   Prevagen 10 mg, Daily   rosuvastatin  (CRESTOR ) 10 mg, Oral, Daily at bedtime   temazepam  (RESTORIL ) 30 mg, Oral, At bedtime PRN, for sleep   tirzepatide  (MOUNJARO ) 10 mg, Subcutaneous, Weekly   triamterene -hydrochlorothiazide  (MAXZIDE -25) 37.5-25 MG tablet 0.5 tablets, Oral, Daily   Xarelto  5 mg, Oral, Daily       Objective:   Physical Exam BP 136/84 (BP Location: Left Arm, Patient Position: Sitting, Cuff Size: Large)   Pulse  77   Temp 97.6 F (36.4 C) (Oral)   Ht 5' 8 (1.727 m)   Wt 213 lb 9.6 oz (96.9 kg)   SpO2 98%   BMI 32.48 kg/m  General: Well developed, NAD, BMI noted Neck: No  thyromegaly  HEENT:  Normocephalic . Face symmetric, atraumatic Lungs:  CTA B Normal respiratory effort, no intercostal retractions, no accessory muscle use. Heart: RRR,  no murmur.  Abdomen:  Not distended, soft, non-tender. No rebound or rigidity.   Lower extremities: no pretibial edema bilaterally  Skin: Exposed areas without rash. Not pale. Not jaundice Neurologic:  alert & oriented X3.  Speech normal, gait appropriate for age and unassisted Strength symmetric and appropriate for age.  Psych: Cognition and judgment appear intact.  Cooperative with normal attention span  and concentration.  Behavior appropriate. No anxious or depressed appearing.     Assessment   Assessment   DM, + neuropathy  HTN Anxiety, insomnia: On restoril  prn  Pulmonary: --OSA, sleep study 2004, on a CPAP --PEs:2010 and 12-2013 - saw hematology 12-2014, rec anticoag x 2 years (until 12-2015), then continue with a low-dose xarelto    life -- multiple pulmonary nodule, last CT 11-2013: No further imaging suggested --RAD, inhalers rarely Fibromyalgia CV:  MVP,  carotid US  1-39%s GI: --GERD,chronic dysphagia s/p  fundoplication 3, esophageal stenosis - April 2021: Barium study, esophageal manometry (see report) -- IBS --Hemorrhoids: S/p banding ~/2019 -- chronic right flank,RUQ pain : since GB surgery 2008 Etiology not clear but likely multifactorial --> Adhesions from the surgery, neuropathy (DM), postherpetic neuralgia, scarring from PE, etc.  MRI T spine done 06-2014: DJD  Thyroid  nodule: Bx (non neoplastic goiter) 2014,  US  05-2016, 2022 (stable) Zoster --  2014 right flank   Assessment & Plan Preventive care reviewed: - Td 2024 -  zostavax 2014 , s/p shingrex x 2 - PNM 23: 2017, 2020; prevnar 2017.  PNM 20: 2025 - s/p RSV - had  a flu shot. Had a covid vax ~ 01/2024 per pt  - Recommend a COVID booster if not done recently - Female care: Saw gyn early 2025 - per pt MMG---Next bilateral screening mammogram 05/2024 - [facility]: Colonoscopy 2009, cscope 11-2015, + polyp, C-scope 01/17/2021, next per GI  Other issues addressed:   DM with neuropathy Blood sugar levels well-controlled, 90-125 mg/dL. Plan: Continue Mounjaro , check A1c and microalbumin. HTN   well-controlled, under 130/85 mmHg.  Continue Maxide which she takes as needed for elevated BP readings. H/oh pulmonary embolism, on anticoagulation.  Monitored by hematology. OSA: Good compliance with CPAP. Hyperlipidemia On rosuvastatin  10 mg daily.  No change, check FLP. Increase LFTs Recently noted increased LFTs.  Chart reviewed: CT abdomen October 2025 ordered for a different reason show with chronic changes of the liver (chronic diffuse bilateral ductal dilatation) to be stable. Has been on Crestor  chronically, no EtOH, has taken Tylenol  at very low doses.  Recheck FLP's, further advised results Hemorrhoids Intermittent blood in stool due to internal hemorrhoids. Dysphagia: Saw GI October 2025 for dysphagia, abdominal pain, had EGD 02/04/2024. RTC 4 to 6 months     "

## 2024-04-18 NOTE — Patient Instructions (Signed)
 GO TO THE LAB :  Get the blood work    Then, go to the front desk for the checkout Please make an appointment for a checkup in 4 to 6 months   Please consider COVID-vaccine   Continue checking your blood pressure regularly Blood pressure goal:  between 110/65 and  135/85. If it is consistently higher or lower, let me know  Diabetes  You can check your sugars at different times  - early in AM fasting  ( blood sugar goal 70-130) - 2 hours after a meal (blood sugar goal less than 180)        TYPE 2 DIABETES MELLITUS WITH DIABETIC NEUROPATHY: Your blood sugar levels are well-controlled, ranging from 90 to 125 mg/dL. -Continue taking Mounjaro  as prescribed. -We checked your A1c and urine tests today.  HYPERTENSION: Your blood pressure is well-controlled, usually under 130/85 mmHg. -Continue taking Maxzide  as needed based on your blood pressure readings.  HISTORY OF PULMONARY EMBOLISM: You are on anticoagulation therapy with Xarelto . -Continue taking Xarelto  as prescribed.  OBSTRUCTIVE SLEEP APNEA: You are using your CPAP regularly and effectively. -Continue using your CPAP as directed.  HYPERLIPIDEMIA: You are taking rosuvastatin  10 mg daily for high cholesterol. -Continue taking rosuvastatin  as prescribed. -We checked your cholesterol panel today.  ABNORMAL LIVER FUNCTION TESTS: Your liver function tests have been elevated previously,  will rechecked your liver function tests today.

## 2024-04-19 LAB — HEPATIC FUNCTION PANEL
AG Ratio: 1.4 (calc) (ref 1.0–2.5)
ALT: 55 U/L — ABNORMAL HIGH (ref 6–29)
AST: 54 U/L — ABNORMAL HIGH (ref 10–35)
Albumin: 4.2 g/dL (ref 3.6–5.1)
Alkaline phosphatase (APISO): 62 U/L (ref 37–153)
Bilirubin, Direct: 0.1 mg/dL (ref 0.0–0.2)
Globulin: 2.9 g/dL (ref 1.9–3.7)
Indirect Bilirubin: 0.3 mg/dL (ref 0.2–1.2)
Total Bilirubin: 0.4 mg/dL (ref 0.2–1.2)
Total Protein: 7.1 g/dL (ref 6.1–8.1)

## 2024-04-19 LAB — LIPID PANEL
Cholesterol: 129 mg/dL
HDL: 80 mg/dL
LDL Cholesterol (Calc): 35 mg/dL
Non-HDL Cholesterol (Calc): 49 mg/dL
Total CHOL/HDL Ratio: 1.6 (calc)
Triglycerides: 60 mg/dL

## 2024-04-19 LAB — HEMOGLOBIN A1C
Hgb A1c MFr Bld: 5.5 %
Mean Plasma Glucose: 111 mg/dL
eAG (mmol/L): 6.2 mmol/L

## 2024-04-20 ENCOUNTER — Encounter: Payer: Self-pay | Admitting: Internal Medicine

## 2024-04-20 LAB — DRUG MONITORING PANEL 375977 , URINE
Alcohol Metabolites: NEGATIVE ng/mL
Alphahydroxyalprazolam: NEGATIVE ng/mL
Alphahydroxymidazolam: NEGATIVE ng/mL
Alphahydroxytriazolam: NEGATIVE ng/mL
Aminoclonazepam: NEGATIVE ng/mL
Amphetamines: NEGATIVE ng/mL
Barbiturates: NEGATIVE ng/mL
Benzodiazepines: POSITIVE ng/mL — AB
Cocaine Metabolite: NEGATIVE ng/mL
Desmethyltramadol: NEGATIVE ng/mL
Hydroxyethylflurazepam: NEGATIVE ng/mL
Lorazepam: NEGATIVE ng/mL
Marijuana Metabolite: NEGATIVE ng/mL
Nordiazepam: NEGATIVE ng/mL
Opiates: NEGATIVE ng/mL
Oxazepam: 1661 ng/mL — ABNORMAL HIGH
Oxycodone: NEGATIVE ng/mL
Temazepam: >8000 ng/mL — ABNORMAL HIGH
Tramadol: NEGATIVE ng/mL

## 2024-04-20 LAB — MICROALBUMIN / CREATININE URINE RATIO
Creatinine, Urine: 167 mg/dL (ref 20–275)
Microalb Creat Ratio: 3 mg/g{creat}
Microalb, Ur: 0.5 mg/dL

## 2024-04-20 LAB — DM TEMPLATE

## 2024-04-20 NOTE — Assessment & Plan Note (Signed)
 Preventive care reviewed: - Td 2024 -  zostavax 2014 , s/p shingrex x 2 - PNM 23: 2017, 2020; prevnar 2017.  PNM 20: 2025 - s/p RSV - had a flu shot. Had a covid vax ~ 01/2024 per pt  - Recommend a COVID booster if not done recently - Female care: Saw gyn early 2025 - per pt MMG---Next bilateral screening mammogram 05/2024 - [facility]: Colonoscopy 2009, cscope 11-2015, + polyp, C-scope 01/17/2021, next per GI

## 2024-04-20 NOTE — Assessment & Plan Note (Signed)
 Preventive care reviewed:  Other issues addressed:   DM with neuropathy Blood sugar levels well-controlled, 90-125 mg/dL. Plan: Continue Mounjaro , check A1c and microalbumin. HTN   well-controlled, under 130/85 mmHg.  Continue Maxide which she takes as needed for elevated BP readings. H/oh pulmonary embolism, on anticoagulation.  Monitored by hematology. OSA: Good compliance with CPAP. Hyperlipidemia On rosuvastatin  10 mg daily.  No change, check FLP. Increase LFTs Recently noted increased LFTs.  Chart reviewed: CT abdomen October 2025 ordered for a different reason show with chronic changes of the liver (chronic diffuse bilateral ductal dilatation) to be stable. Has been on Crestor  chronically, no EtOH, has taken Tylenol  at very low doses.  Recheck FLP's, further advised results Hemorrhoids Intermittent blood in stool due to internal hemorrhoids. Dysphagia: Saw GI October 2025 for dysphagia, abdominal pain, had EGD 02/04/2024. RTC 4 to 6 months

## 2024-04-21 ENCOUNTER — Encounter (INDEPENDENT_AMBULATORY_CARE_PROVIDER_SITE_OTHER): Payer: Self-pay | Admitting: Internal Medicine

## 2024-04-21 ENCOUNTER — Ambulatory Visit (INDEPENDENT_AMBULATORY_CARE_PROVIDER_SITE_OTHER): Admitting: Internal Medicine

## 2024-04-21 ENCOUNTER — Ambulatory Visit: Admitting: Internal Medicine

## 2024-04-21 VITALS — BP 133/90 | HR 83 | Temp 97.5°F | Ht 68.0 in | Wt 211.0 lb

## 2024-04-21 DIAGNOSIS — E1169 Type 2 diabetes mellitus with other specified complication: Secondary | ICD-10-CM

## 2024-04-21 DIAGNOSIS — G4733 Obstructive sleep apnea (adult) (pediatric): Secondary | ICD-10-CM

## 2024-04-21 DIAGNOSIS — K222 Esophageal obstruction: Secondary | ICD-10-CM

## 2024-04-21 DIAGNOSIS — E66811 Obesity, class 1: Secondary | ICD-10-CM | POA: Diagnosis not present

## 2024-04-21 DIAGNOSIS — Z6833 Body mass index (BMI) 33.0-33.9, adult: Secondary | ICD-10-CM

## 2024-04-21 DIAGNOSIS — I10 Essential (primary) hypertension: Secondary | ICD-10-CM

## 2024-04-21 DIAGNOSIS — Z7985 Long-term (current) use of injectable non-insulin antidiabetic drugs: Secondary | ICD-10-CM | POA: Diagnosis not present

## 2024-04-21 MED ORDER — TIRZEPATIDE 10 MG/0.5ML ~~LOC~~ SOAJ: 10.0000 mg | SUBCUTANEOUS | 0 refills | Status: AC

## 2024-04-21 NOTE — Progress Notes (Unsigned)
 "  Office: [phone]  /  Fax: [phone]  Weight Summary and Body Composition Analysis (BIA)  Vitals Temp: (!) 97.5 F (36.4 C) BP: (!) 133/90 Pulse Rate: 83 SpO2: 99 %   Anthropometric Measurements Height: 5' 8 (1.727 m) Weight: 211 lb (95.7 kg) BMI (Calculated): 32.09 Weight at Last Visit: 211 lb Weight Lost Since Last Visit: 0 Weight Gained Since Last Visit: 0 Starting Weight: 256 lb Total Weight Loss (lbs): 45 lb (20.4 kg) Peak Weight: 259 lb   Body Composition  Body Fat %: 43.8 % Fat Mass (lbs): 92.8 lbs Muscle Mass (lbs): 113 lbs Total Body Water (lbs): 80 lbs Visceral Fat Rating : 13    RMR: 1310  Today's Visit #: 18  Starting Date: 12/12/22   Subjective   Chief Complaint: Obesity  Interval History  Discussed the use of AI scribe software for clinical note transcription with the patient, who gave verbal consent to proceed.  History of Present Illness Jennifer [name] is a 73 year old female with hypertension, sleep apnea, and type 2 diabetes who presents for medical weight management.  She has been on a medically supervised weight loss plan and has lost approximately fifty pounds over the past year. Her weight loss is attributed to adherence to a 1200 calorie diet about 80-85% of the time, regular exercise three days a week for 60 minutes, and the use of GLP-1 receptor agonist (Mounjaro ) for diabetes management. No barriers to maintaining her weight loss regimen are reported.  She attributes her recent elevated blood pressure to recent travel and inconsistent medication use. She acknowledges not taking her blood pressure medication regularly.  She has sleep apnea and uses CPAP therapy.  Her type 2 diabetes is managed with Mounjaro . The medication helps her feel full, and she does not experience significant hunger. She follows a high-protein diet and ensures adequate protein intake daily.  She experiences difficulty swallowing, particularly with  meats, and has had esophageal dilation in the past. Pain lasting about two weeks after her last dilation has made her hesitant to undergo the procedure again. She manages her swallowing difficulties by drinking hot lemon water before meals and chewing her food thoroughly.  She experiences occasional constipation, managed with Miralax  and Benefiber. She sometimes forgets to take these supplements but is considering adding kiwi to her diet for additional fiber.  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 She is motivated to continue her weight loss journey and maintain her current health improvements.     Challenges affecting patient progress: Difficulty with swallowing.    Pharmacotherapy for weight management: She is currently taking Monjauro with diabetes as the primary indication and obesity secondary with adequate clinical response  and without side effects..   Assessment and Plan   Treatment Plan For Obesity:  Recommended Dietary Goals  Jennifer [name] is currently in the action stage of change. As such, her goal is to continue weight management plan. She has agreed to: continue current plan  Behavioral Health and Counseling  We discussed the following behavioral modification strategies today: continue to work on maintaining a reduced calorie state, getting the recommended amount of protein, incorporating whole foods, making healthy choices, staying well hydrated and practicing mindfulness when eating. and increase protein intake, fibrous foods (25 grams per day for women, 30 grams for men) and water to improve satiety and decrease hunger signals. .  Additional education and resources provided today: None  Recommended Physical Activity Goals  Jennifer [name] has been advised  to work up to 150 minutes of moderate intensity aerobic activity a week and strengthening exercises 2-3 times per week for cardiovascular health, weight loss maintenance and preservation of muscle  mass.  She has agreed to :  Increase volume of physical activity to a goal of 240 minutes a week and Combine aerobic and strengthening exercises for efficiency and improved cardiometabolic health.  Medical Interventions and Pharmacotherapy  We discussed various medication options to help Jennifer [name] with her weight loss efforts and we both agreed to : Adequate clinical response to anti-obesity medication, continue current regimen and do not recommend further increases in GLP-1 due to adequate clinical response   Associated Conditions Impacted by Obesity Treatment  Assessment & Plan Class 1 obesity with serious comorbidity and body mass index (BMI) of 33.0 to 33.9 in adult, unspecified obesity type  Type 2 diabetes mellitus with other specified complication, without long-term current use of insulin  (HCC)  Essential hypertension  Esophageal stricture  OSA on CPAP     Assessment and Plan Assessment & Plan Medical weight management for class 1 obesity with serious comorbidity She has lost approximately 50 pounds over the past year through a medically supervised weight loss plan, including GLP-1 agonist therapy (Mounjaro ) and lifestyle modifications. She maintains a 1200 calorie diet 80-85% of the time and exercises three days a week for 60 minutes, focusing on weights and cardio. Her BMI is 32, and she has maintained her muscle mass despite weight loss. She aims to lose an additional 10 pounds to reach a weight of 200 pounds. Discussed the importance of maintaining muscle mass, especially post-menopause, and the benefits of strengthening exercises. Considered using a weighted vest to increase exercise intensity and bone strength. - Continue current weight management plan with GLP-1 agonist therapy (Mounjaro ). - Maintain 1200 calorie diet, aiming for 1000-1300 calories if further weight loss is desired. - Increase physical activity by adding an additional day of exercise, possibly using a weighted  vest. - Consider meal replacements for one meal per day to create a caloric deficit.  Type 2 diabetes mellitus in remission Type 2 diabetes is in remission, likely due to significant weight loss and lifestyle changes. She is on Mounjaro , which aids in appetite control and weight management. - Continue Mounjaro  for diabetes management and weight control.  Essential hypertension Blood pressure was elevated during the visit, likely due to missed medication doses and recent physical activity. She is on Maxzide  and has been inconsistent with medication adherence. Discussed the importance of regular medication adherence to prevent complications such as kidney problems and stroke, especially given her age and use of a blood thinner. - Take half a tablet of Maxzide  daily to manage blood pressure. - Ensure regular medication adherence to prevent complications.  Esophageal stricture She experiences difficulty swallowing, particularly with meats, and has had painful dilation procedures in the past. She manages symptoms by consuming soft foods and drinking hot lemon water before meals. Discussed the importance of maintaining esophageal function by consuming regular food and avoiding large boluses of mixed consistencies. - Continue consuming soft foods and hot lemon water before meals. - Avoid large boluses of mixed consistencies; eat foods in separate courses. - Consider using a weighted vest during walks to increase exercise intensity.    Patient will continue evidence-based nutrition and behavioral strategies as part of a comprehensive weight management plan, supported by ongoing pharmacotherapy.  Emphasis remains on sustainable lifestyle modification to enhance and maintain therapeutic benefit.     Objective  Physical Exam:  Blood pressure (!) 133/90, pulse 83, temperature (!) 97.5 F (36.4 C), height 5' 8 (1.727 m), weight 211 lb (95.7 kg), SpO2 99%. Body mass index is 32.08  kg/m.  General: She is overweight, cooperative, alert, well developed, and in no acute distress. PSYCH: Has normal mood, affect and thought process.   HEENT: EOMI, sclerae are anicteric. Lungs: Normal breathing effort, no conversational dyspnea. Extremities: No edema.  Neurologic: No gross sensory or motor deficits. No tremors or fasciculations noted.    Diagnostic Data Reviewed:  BMET    Component Value Date/Time   NA 142 01/30/2024 1235   NA 143 12/12/2022 1100   NA 143 02/16/2017 1450   NA 142 01/19/2016 1141   K 3.8 01/30/2024 1235   K 3.7 02/16/2017 1450   K 3.7 01/19/2016 1141   CL 101 01/30/2024 1235   CL 105 02/16/2017 1450   CO2 33 (H) 01/30/2024 1235   CO2 32 02/16/2017 1450   CO2 27 01/19/2016 1141   GLUCOSE 89 01/30/2024 1235   GLUCOSE 88 02/16/2017 1450   BUN 15 01/30/2024 1235   BUN 17 12/12/2022 1100   BUN 14 02/16/2017 1450   BUN 19.5 01/19/2016 1141   CREATININE 0.93 01/30/2024 1235   CREATININE 1.04 (H) 10/17/2023 1442   CREATININE 1.2 02/16/2017 1450   CREATININE 0.9 01/19/2016 1141   CALCIUM  9.5 01/30/2024 1235   CALCIUM  8.8 02/16/2017 1450   CALCIUM  8.8 01/19/2016 1141   GFRNONAA 58 (L) 10/17/2023 1442   GFRAA >60 12/25/2019 1353   Lab Results  Component Value Date   HGBA1C 5.5 04/18/2024   HGBA1C 6.2 (H) 10/14/2007   Lab Results  Component Value Date   INSULIN  20.4 12/12/2022   INSULIN  12.6 05/19/2021   Lab Results  Component Value Date   TSH 1.82 07/24/2023   CBC    Component Value Date/Time   WBC 3.7 (L) 01/30/2024 1235   RBC 4.44 01/30/2024 1235   HGB 12.5 04/09/2024 1450   HGB 12.1 02/16/2017 1450   HCT 38.9 01/30/2024 1235   HCT 36.6 02/16/2017 1450   PLT 266.0 01/30/2024 1235   PLT 220 10/17/2023 1442   PLT 239 02/16/2017 1450   MCV 87.6 01/30/2024 1235   MCV 87 02/16/2017 1450   MCH 28.5 10/17/2023 1442   MCHC 32.2 01/30/2024 1235   RDW 16.1 (H) 01/30/2024 1235   RDW 15.4 02/16/2017 1450   Iron Studies     Component Value Date/Time   IRON 66 01/30/2024 1235   IRON 49 02/16/2017 1536   TIBC 334.6 01/30/2024 1235   TIBC 284 02/16/2017 1536   FERRITIN 51 12/05/2021 1452   FERRITIN 66 02/16/2017 1536   IRONPCTSAT 19.7 (L) 01/30/2024 1235   IRONPCTSAT 17 (L) 02/16/2017 1536   Lipid Panel     Component Value Date/Time   CHOL 129 04/18/2024 1511   TRIG 60 04/18/2024 1511   HDL 80 04/18/2024 1511   CHOLHDL 1.6 04/18/2024 1511   VLDL 9.8 06/05/2022 1320   LDLCALC 35 04/18/2024 1511   Hepatic Function Panel     Component Value Date/Time   PROT 7.1 04/18/2024 1511   PROT 7.1 12/12/2022 1100   PROT 7.3 02/16/2017 1450   PROT 7.3 01/19/2016 1141   ALBUMIN 4.3 04/09/2024 1450   ALBUMIN 4.3 12/12/2022 1100   ALBUMIN 3.6 01/19/2016 1141   AST 54 (H) 04/18/2024 1511   AST 29 10/17/2023 1442   AST 21 01/19/2016 1141   ALT  55 (H) 04/18/2024 1511   ALT 31 10/17/2023 1442   ALT 27 02/16/2017 1450   ALT 18 01/19/2016 1141   ALKPHOS 56 04/09/2024 1450   ALKPHOS 57 02/16/2017 1450   ALKPHOS 59 01/19/2016 1141   BILITOT 0.4 04/18/2024 1511   BILITOT 0.5 10/17/2023 1442   BILITOT 0.31 01/19/2016 1141   BILIDIR 0.1 04/18/2024 1511   IBILI 0.3 04/18/2024 1511      Component Value Date/Time   TSH 1.82 07/24/2023 1436   Nutritional Lab Results  Component Value Date   VD25OH 61.5 12/12/2022   VD25OH 42.2 05/19/2021    Medications: Outpatient Encounter Medications as of 04/21/2024  Medication Sig Note   acetaminophen  (TYLENOL ) 500 MG tablet Take 1 tablet (500 mg total) by mouth every 6 (six) hours as needed.    AMBULATORY NON FORMULARY MEDICATION Medication Name: Nitroglycerin ointment 0.125% Use a pea sized amount per rectum three times a day for 6-8 weeks 02/04/2024: 02/03/2024: Has not started   Apoaequorin (PREVAGEN) 10 MG CAPS Take 10 mg by mouth daily. prevagen    cycloSPORINE  (RESTASIS ) 0.05 % ophthalmic emulsion Place 1 drop into both eyes 2 (two) times daily.     hydrocortisone  valerate cream (WESTCORT ) 0.2 % Apply 1 Application topically daily as needed (itching).    hyoscyamine  (LEVSIN ) 0.125 MG tablet Take 1 tablet (0.125 mg total) by mouth every 4 hours as needed.    Multiple Vitamins-Minerals (MULTIVITAMIN WITH MINERALS) tablet Take 2 tablets by mouth daily. Vitafusion woman gummy    NEXIUM  40 MG capsule Take 1 capsule (40 mg total) by mouth daily before breakfast.    rosuvastatin  (CRESTOR ) 10 MG tablet Take 1 tablet (10 mg total) by mouth at bedtime.    temazepam  (RESTORIL ) 30 MG capsule TAKE ONE CAPSULE BY MOUTH AT BEDTIME AS NEEDED FOR SLEEP    tirzepatide  (MOUNJARO ) 10 MG/0.5ML Pen Inject 10 mg into the skin once a week.    triamterene -hydrochlorothiazide  (MAXZIDE -25) 37.5-25 MG tablet Take 0.5 tablets by mouth daily.    XARELTO  2.5 MG TABS tablet TAKE TWO TABLETS BY MOUTH DAILY    No facility-administered encounter medications on file as of 04/21/2024.     Follow-Up   No follow-ups on file.SABRA She was informed of the importance of frequent follow up visits to maximize her success with intensive lifestyle modifications for her multiple health conditions.  Attestation Statement   Reviewed by clinician on day of visit: allergies, medications, problem list, medical history, surgical history, family history, social history, and previous encounter notes.     Lucas Parker, MD  "

## 2024-04-22 ENCOUNTER — Ambulatory Visit: Payer: Self-pay | Admitting: Internal Medicine

## 2024-04-22 DIAGNOSIS — R7989 Other specified abnormal findings of blood chemistry: Secondary | ICD-10-CM

## 2024-04-23 ENCOUNTER — Other Ambulatory Visit: Payer: Self-pay | Admitting: Hematology & Oncology

## 2024-04-28 ENCOUNTER — Other Ambulatory Visit (INDEPENDENT_AMBULATORY_CARE_PROVIDER_SITE_OTHER)

## 2024-04-28 DIAGNOSIS — R7989 Other specified abnormal findings of blood chemistry: Secondary | ICD-10-CM | POA: Diagnosis not present

## 2024-04-28 LAB — IBC + FERRITIN
Ferritin: 73.6 ng/mL (ref 10.0–291.0)
Iron: 79 ug/dL (ref 42–145)
Saturation Ratios: 27.9 % (ref 20.0–50.0)
TIBC: 282.8 ug/dL (ref 250.0–450.0)
Transferrin: 202 mg/dL — ABNORMAL LOW (ref 212.0–360.0)

## 2024-04-29 LAB — HEPATITIS C ANTIBODY: Hepatitis C Ab: NONREACTIVE

## 2024-04-29 LAB — HEPATITIS B SURFACE ANTIGEN: Hepatitis B Surface Ag: NONREACTIVE

## 2024-04-29 LAB — HEPATITIS B CORE ANTIBODY, TOTAL: Hep B Core Total Ab: REACTIVE — AB

## 2024-05-01 ENCOUNTER — Ambulatory Visit: Payer: Self-pay | Admitting: Internal Medicine

## 2024-05-05 ENCOUNTER — Encounter: Payer: Self-pay | Admitting: Internal Medicine

## 2024-05-05 ENCOUNTER — Telehealth: Payer: Self-pay | Admitting: Internal Medicine

## 2024-05-06 NOTE — Telephone Encounter (Signed)
 PDMP okay, Rx sent

## 2024-05-06 NOTE — Telephone Encounter (Signed)
 Requesting: temazepam   Contract: 06/13/22 UDS: 04/18/24 Last Visit: 04/18/24 Next Visit: 09/16/24 Last Refill: 01/31/24 #30 and 2RF   Please Advise

## 2024-05-19 ENCOUNTER — Ambulatory Visit (INDEPENDENT_AMBULATORY_CARE_PROVIDER_SITE_OTHER): Admitting: Internal Medicine

## 2024-05-22 ENCOUNTER — Encounter (INDEPENDENT_AMBULATORY_CARE_PROVIDER_SITE_OTHER): Payer: Self-pay | Admitting: Internal Medicine

## 2024-05-22 ENCOUNTER — Ambulatory Visit (INDEPENDENT_AMBULATORY_CARE_PROVIDER_SITE_OTHER): Admitting: Internal Medicine

## 2024-05-22 VITALS — BP 138/83 | HR 98 | Temp 97.7°F | Ht 68.0 in | Wt 213.0 lb

## 2024-05-22 DIAGNOSIS — G4733 Obstructive sleep apnea (adult) (pediatric): Secondary | ICD-10-CM

## 2024-05-22 DIAGNOSIS — I10 Essential (primary) hypertension: Secondary | ICD-10-CM

## 2024-05-22 DIAGNOSIS — E66811 Obesity, class 1: Secondary | ICD-10-CM

## 2024-05-22 DIAGNOSIS — Z7985 Long-term (current) use of injectable non-insulin antidiabetic drugs: Secondary | ICD-10-CM

## 2024-05-22 DIAGNOSIS — Z6833 Body mass index (BMI) 33.0-33.9, adult: Secondary | ICD-10-CM | POA: Diagnosis not present

## 2024-05-22 DIAGNOSIS — E1169 Type 2 diabetes mellitus with other specified complication: Secondary | ICD-10-CM | POA: Diagnosis not present

## 2024-05-22 NOTE — Progress Notes (Unsigned)
 "  Office: [phone]  /  Fax: [phone]  Weight Summary and Body Composition Analysis (BIA)  No data recorded  No data recorded  No data recorded   No data recorded  No data recorded  No data recorded   Subjective   Chief Complaint: Obesity  Interval History  Discussed the use of AI scribe software for clinical note transcription with the patient, who gave verbal consent to proceed.  History of Present Illness Jennifer [name] is a 74 year old female with obesity, hypertension, sleep apnea, and type 2 diabetes in remission who presents for medical weight management.  Since last office visit she has gained 2 pounds.  Is following a 1000-calorie nutrition plan with good adherence.  She is currently on Mounjaro  10 mg once a week for weight management and has lost close to fifty pounds. She aims to lose an additional ten pounds and her metabolism was checked in October.  Her current eating pattern includes regular meals with adequate protein intake, consumption of fruits and vegetables several days a week, and avoidance of sugar-sweetened drinks. She rarely eats out and experiences minimal hunger between meals, feeling satisfied and full sooner. She describes having minimal to no 'food noise'.  She engages in moderate physical activity about three to four days a week and strength training two to three times a week. She does not report any barriers to physical activity and denies any side effects from her current regimen.  She experiences occasional stomach discomfort, particularly when stressed or rushing, such as when preparing to go to the gym. She has a history of adjusting her diet to manage her weight, including using meal replacements and monitoring her calorie intake.  Her social history includes being active and enjoying outdoor activities, such as walking dogs and going to the gym. Her children are out of the house, which has led her to consider new hobbies.      Challenges affecting patient progress: medical comorbidities, slow metabolism for age, and metabolic adaptations associated with weight loss.    Pharmacotherapy for weight management: She is currently taking Monjauro with diabetes as the primary indication and obesity secondary with adequate clinical response  and without side effects..   Assessment and Plan   Treatment Plan For Obesity:  Recommended Dietary Goals  Jennifer [name] is currently in the action stage of change. As such, her goal is to continue weight management plan. She has agreed to: follow a balanced (30%/40%/30%), whole foods-based, reduced-calorie meal plan (RCNP) targeting 1000 kcal per day, incorporate prepackaged healthy meals for convenience, and incorporate 1-2 meal replacements a day for convenience   Behavioral Health and Counseling  We discussed the following behavioral modification strategies today: work on tracking and journaling calories using tracking application.  Additional education and resources provided today: None  Recommended Physical Activity Goals  Jennifer [name] has been advised to work up to 150 minutes of moderate intensity aerobic activity a week and strengthening exercises 2-3 times per week for cardiovascular health, weight loss maintenance and preservation of muscle mass.  She has agreed to :  Increase volume of physical activity to a goal of 240 minutes a week and Combine aerobic and strengthening exercises for efficiency and improved cardiometabolic health.  Medical Interventions and Pharmacotherapy  We discussed various medication options to help Jennifer [name] with her weight loss efforts and we both agreed to : Adequate clinical response to anti-obesity medication, continue current anti-obesity regimen  Associated Conditions Impacted by Obesity Treatment  Assessment & Plan Type  2 diabetes mellitus with other specified complication, without long-term current use of insulin  (HCC) In remission.  Continue  medically supervised weight management plan inclusive of GLP-1. Essential hypertension Vitals:   05/22/24 1500  BP: 138/83   Stable. Blood pressure control expected to improve with continued weight reduction. No medication changes indicated today; continue current antihypertensive regimen and home BP monitoring.   OSA on CPAP Stable on current therapy. Weight reduction is anticipated to improve OSA severity. Continue current management; reassess symptoms as weight loss progresses.   Class 1 obesity with serious comorbidity and body mass index (BMI) of 33.0 to 33.9 in adult, unspecified obesity type   Significant weight loss of nearly 50 pounds has led to metabolic adaptation with a slowdown in metabolic rate. Muscle mass has increased, indicating positive body composition changes. Current caloric intake is at maintenance level, necessitating further reduction for additional weight loss. She is satisfied with progress but desires to lose an additional 10 pounds. - Reduce caloric intake to 1000 calories per day, 4-5 days a week, using meal replacements and low-calorie meals. - Consider increasing Mounjaro  dosage if hunger persists at 1000 calories per day. - Monitor weight and body composition changes. - Encouraged continued physical activity, including strength training and moderate-intensity exercises.  Assessment and Plan      Objective   Physical Exam:  Blood pressure 138/83, pulse 98, temperature 97.7 F (36.5 C), height 5' 8 (1.727 m), weight 213 lb (96.6 kg), SpO2 97%. Body mass index is 32.39 kg/m.  General: She is overweight, cooperative, alert, well developed, and in no acute distress. PSYCH: Has normal mood, affect and thought process.   HEENT: EOMI, sclerae are anicteric. Lungs: Normal breathing effort, no conversational dyspnea. Extremities: No edema.  Neurologic: No gross sensory or motor deficits. No tremors or fasciculations noted.    Diagnostic Data  Reviewed:  BMET    Component Value Date/Time   NA 142 01/30/2024 1235   NA 143 12/12/2022 1100   NA 143 02/16/2017 1450   NA 142 01/19/2016 1141   K 3.8 01/30/2024 1235   K 3.7 02/16/2017 1450   K 3.7 01/19/2016 1141   CL 101 01/30/2024 1235   CL 105 02/16/2017 1450   CO2 33 (H) 01/30/2024 1235   CO2 32 02/16/2017 1450   CO2 27 01/19/2016 1141   GLUCOSE 89 01/30/2024 1235   GLUCOSE 88 02/16/2017 1450   BUN 15 01/30/2024 1235   BUN 17 12/12/2022 1100   BUN 14 02/16/2017 1450   BUN 19.5 01/19/2016 1141   CREATININE 0.93 01/30/2024 1235   CREATININE 1.04 (H) 10/17/2023 1442   CREATININE 1.2 02/16/2017 1450   CREATININE 0.9 01/19/2016 1141   CALCIUM  9.5 01/30/2024 1235   CALCIUM  8.8 02/16/2017 1450   CALCIUM  8.8 01/19/2016 1141   GFRNONAA 58 (L) 10/17/2023 1442   GFRAA >60 12/25/2019 1353   Lab Results  Component Value Date   HGBA1C 5.5 04/18/2024   HGBA1C 6.2 (H) 10/14/2007   Lab Results  Component Value Date   INSULIN  20.4 12/12/2022   INSULIN  12.6 05/19/2021   Lab Results  Component Value Date   TSH 1.82 07/24/2023   CBC    Component Value Date/Time   WBC 3.7 (L) 01/30/2024 1235   RBC 4.44 01/30/2024 1235   HGB 12.5 04/09/2024 1450   HGB 12.1 02/16/2017 1450   HCT 38.9 01/30/2024 1235   HCT 36.6 02/16/2017 1450   PLT 266.0 01/30/2024 1235   PLT 220 10/17/2023 1442  PLT 239 02/16/2017 1450   MCV 87.6 01/30/2024 1235   MCV 87 02/16/2017 1450   MCH 28.5 10/17/2023 1442   MCHC 32.2 01/30/2024 1235   RDW 16.1 (H) 01/30/2024 1235   RDW 15.4 02/16/2017 1450   Iron Studies    Component Value Date/Time   IRON 79 04/28/2024 1047   IRON 49 02/16/2017 1536   TIBC 282.8 04/28/2024 1047   TIBC 284 02/16/2017 1536   FERRITIN 73.6 04/28/2024 1047   FERRITIN 66 02/16/2017 1536   IRONPCTSAT 27.9 04/28/2024 1047   IRONPCTSAT 17 (L) 02/16/2017 1536   Lipid Panel     Component Value Date/Time   CHOL 129 04/18/2024 1511   TRIG 60 04/18/2024 1511   HDL 80  04/18/2024 1511   CHOLHDL 1.6 04/18/2024 1511   VLDL 9.8 06/05/2022 1320   LDLCALC 35 04/18/2024 1511   Hepatic Function Panel     Component Value Date/Time   PROT 7.1 04/18/2024 1511   PROT 7.1 12/12/2022 1100   PROT 7.3 02/16/2017 1450   PROT 7.3 01/19/2016 1141   ALBUMIN 4.3 04/09/2024 1450   ALBUMIN 4.3 12/12/2022 1100   ALBUMIN 3.6 01/19/2016 1141   AST 54 (H) 04/18/2024 1511   AST 29 10/17/2023 1442   AST 21 01/19/2016 1141   ALT 55 (H) 04/18/2024 1511   ALT 31 10/17/2023 1442   ALT 27 02/16/2017 1450   ALT 18 01/19/2016 1141   ALKPHOS 56 04/09/2024 1450   ALKPHOS 57 02/16/2017 1450   ALKPHOS 59 01/19/2016 1141   BILITOT 0.4 04/18/2024 1511   BILITOT 0.5 10/17/2023 1442   BILITOT 0.31 01/19/2016 1141   BILIDIR 0.1 04/18/2024 1511   IBILI 0.3 04/18/2024 1511      Component Value Date/Time   TSH 1.82 07/24/2023 1436   Nutritional Lab Results  Component Value Date   VD25OH 61.5 12/12/2022   VD25OH 42.2 05/19/2021    Medications: Outpatient Encounter Medications as of 05/22/2024  Medication Sig Note   acetaminophen  (TYLENOL ) 500 MG tablet Take 1 tablet (500 mg total) by mouth every 6 (six) hours as needed.    AMBULATORY NON FORMULARY MEDICATION Medication Name: Nitroglycerin ointment 0.125% Use a pea sized amount per rectum three times a day for 6-8 weeks 02/04/2024: 02/03/2024: Has not started   Apoaequorin (PREVAGEN) 10 MG CAPS Take 10 mg by mouth daily. prevagen    cycloSPORINE  (RESTASIS ) 0.05 % ophthalmic emulsion Place 1 drop into both eyes 2 (two) times daily.    hydrocortisone  valerate cream (WESTCORT ) 0.2 % Apply 1 Application topically daily as needed (itching).    hyoscyamine  (LEVSIN ) 0.125 MG tablet Take 1 tablet (0.125 mg total) by mouth every 4 hours as needed.    Multiple Vitamins-Minerals (MULTIVITAMIN WITH MINERALS) tablet Take 2 tablets by mouth daily. Vitafusion woman gummy    NEXIUM  40 MG capsule Take 1 capsule (40 mg total) by mouth daily  before breakfast.    rosuvastatin  (CRESTOR ) 10 MG tablet Take 1 tablet (10 mg total) by mouth at bedtime.    temazepam  (RESTORIL ) 30 MG capsule TAKE ONE CAPSULE BY MOUTH AT BEDTIME AS NEEDED FOR SLEEP.    tirzepatide  (MOUNJARO ) 10 MG/0.5ML Pen Inject 10 mg into the skin once a week.    triamterene -hydrochlorothiazide  (MAXZIDE -25) 37.5-25 MG tablet Take 0.5 tablets by mouth daily.    XARELTO  2.5 MG TABS tablet TAKE TWO TABLETS BY MOUTH DAILY    No facility-administered encounter medications on file as of 05/22/2024.     Follow-Up  Return in about 4 weeks (around 06/19/2024) for For Weight Mangement with Dr. Francyne.Jennifer [name] She was informed of the importance of frequent follow up visits to maximize her success with intensive lifestyle modifications for her multiple health conditions.  Attestation Statement   Reviewed by clinician on day of visit: allergies, medications, problem list, medical history, surgical history, family history, social history, and previous encounter notes.     Lucas Francyne, MD  "

## 2024-05-24 NOTE — Assessment & Plan Note (Signed)
 In remission.  Continue medically supervised weight management plan inclusive of GLP-1.

## 2024-05-24 NOTE — Assessment & Plan Note (Signed)
 SABRA

## 2024-05-24 NOTE — Assessment & Plan Note (Signed)
 Stable on current therapy. Weight reduction is anticipated to improve OSA severity. Continue current management; reassess symptoms as weight loss progresses.

## 2024-05-24 NOTE — Assessment & Plan Note (Signed)
 Vitals:   05/22/24 1500  BP: 138/83   Stable. Blood pressure control expected to improve with continued weight reduction. No medication changes indicated today; continue current antihypertensive regimen and home BP monitoring.

## 2024-05-26 ENCOUNTER — Ambulatory Visit (INDEPENDENT_AMBULATORY_CARE_PROVIDER_SITE_OTHER): Admitting: Internal Medicine

## 2024-05-27 ENCOUNTER — Other Ambulatory Visit: Payer: Self-pay | Admitting: Hematology & Oncology

## 2024-05-29 ENCOUNTER — Ambulatory Visit (INDEPENDENT_AMBULATORY_CARE_PROVIDER_SITE_OTHER): Admitting: Internal Medicine

## 2024-06-03 ENCOUNTER — Ambulatory Visit: Admitting: Gastroenterology

## 2024-06-16 ENCOUNTER — Ambulatory Visit (INDEPENDENT_AMBULATORY_CARE_PROVIDER_SITE_OTHER): Admitting: Internal Medicine

## 2024-07-14 ENCOUNTER — Ambulatory Visit (INDEPENDENT_AMBULATORY_CARE_PROVIDER_SITE_OTHER): Admitting: Internal Medicine

## 2024-09-10 ENCOUNTER — Ambulatory Visit

## 2024-09-10 ENCOUNTER — Ambulatory Visit: Admitting: "Endocrinology

## 2024-09-16 ENCOUNTER — Ambulatory Visit: Admitting: Internal Medicine

## 2024-10-08 ENCOUNTER — Inpatient Hospital Stay

## 2024-10-08 ENCOUNTER — Inpatient Hospital Stay: Admitting: Hematology & Oncology
